# Patient Record
Sex: Female | Born: 1935 | Race: White | Hispanic: No | Marital: Married | Smoking: Former smoker
Health system: Southern US, Community
[De-identification: ages and names within clinical notes are randomized; demographics above are authoritative.]

## PROBLEM LIST (undated history)

## (undated) DIAGNOSIS — F32A Depression, unspecified: Secondary | ICD-10-CM

## (undated) DIAGNOSIS — R05 Cough: Secondary | ICD-10-CM

## (undated) DIAGNOSIS — K5900 Constipation, unspecified: Secondary | ICD-10-CM

## (undated) DIAGNOSIS — M1990 Unspecified osteoarthritis, unspecified site: Secondary | ICD-10-CM

## (undated) DIAGNOSIS — I10 Essential (primary) hypertension: Secondary | ICD-10-CM

## (undated) DIAGNOSIS — R109 Unspecified abdominal pain: Secondary | ICD-10-CM

## (undated) DIAGNOSIS — T17310A Gastric contents in larynx causing asphyxiation, initial encounter: Secondary | ICD-10-CM

## (undated) DIAGNOSIS — I4891 Unspecified atrial fibrillation: Secondary | ICD-10-CM

## (undated) DIAGNOSIS — J189 Pneumonia, unspecified organism: Secondary | ICD-10-CM

## (undated) DIAGNOSIS — I499 Cardiac arrhythmia, unspecified: Secondary | ICD-10-CM

## (undated) DIAGNOSIS — K219 Gastro-esophageal reflux disease without esophagitis: Secondary | ICD-10-CM

## (undated) DIAGNOSIS — F329 Major depressive disorder, single episode, unspecified: Secondary | ICD-10-CM

## (undated) DIAGNOSIS — K5909 Other constipation: Secondary | ICD-10-CM

## (undated) DIAGNOSIS — R059 Cough, unspecified: Secondary | ICD-10-CM

## (undated) DIAGNOSIS — Z9109 Other allergy status, other than to drugs and biological substances: Secondary | ICD-10-CM

## (undated) DIAGNOSIS — E7800 Pure hypercholesterolemia, unspecified: Secondary | ICD-10-CM

## (undated) DIAGNOSIS — M797 Fibromyalgia: Secondary | ICD-10-CM

## (undated) DIAGNOSIS — G4730 Sleep apnea, unspecified: Secondary | ICD-10-CM

## (undated) DIAGNOSIS — K449 Diaphragmatic hernia without obstruction or gangrene: Secondary | ICD-10-CM

## (undated) DIAGNOSIS — C801 Malignant (primary) neoplasm, unspecified: Secondary | ICD-10-CM

## (undated) DIAGNOSIS — R110 Nausea: Secondary | ICD-10-CM

## (undated) DIAGNOSIS — F419 Anxiety disorder, unspecified: Secondary | ICD-10-CM

## (undated) DIAGNOSIS — J45909 Unspecified asthma, uncomplicated: Secondary | ICD-10-CM

## (undated) DIAGNOSIS — R3915 Urgency of urination: Secondary | ICD-10-CM

## (undated) HISTORY — PX: TONSILLECTOMY: SUR1361

## (undated) HISTORY — DX: Cough: R05

## (undated) HISTORY — PX: EYE SURGERY: SHX253

## (undated) HISTORY — DX: Nausea: R11.0

## (undated) HISTORY — DX: Cough, unspecified: R05.9

## (undated) HISTORY — PX: SKIN CANCER EXCISION: SHX779

## (undated) HISTORY — DX: Gastro-esophageal reflux disease without esophagitis: K21.9

## (undated) HISTORY — DX: Other constipation: K59.09

## (undated) HISTORY — PX: CHOLECYSTECTOMY: SHX55

## (undated) HISTORY — PX: APPENDECTOMY: SHX54

## (undated) HISTORY — PX: BACK SURGERY: SHX140

## (undated) HISTORY — PX: SPINE SURGERY: SHX786

---

## 2001-06-15 ENCOUNTER — Ambulatory Visit (HOSPITAL_COMMUNITY): Admission: RE | Admit: 2001-06-15 | Discharge: 2001-06-15 | Payer: Self-pay | Admitting: Internal Medicine

## 2001-06-15 ENCOUNTER — Encounter (INDEPENDENT_AMBULATORY_CARE_PROVIDER_SITE_OTHER): Payer: Self-pay | Admitting: Internal Medicine

## 2003-03-29 ENCOUNTER — Ambulatory Visit (HOSPITAL_COMMUNITY): Admission: RE | Admit: 2003-03-29 | Discharge: 2003-03-29 | Payer: Self-pay | Admitting: Internal Medicine

## 2003-03-29 HISTORY — PX: COLONOSCOPY: SHX174

## 2003-03-29 HISTORY — PX: UPPER GASTROINTESTINAL ENDOSCOPY: SHX188

## 2003-04-19 ENCOUNTER — Ambulatory Visit (HOSPITAL_COMMUNITY): Admission: RE | Admit: 2003-04-19 | Discharge: 2003-04-19 | Payer: Self-pay | Admitting: Internal Medicine

## 2003-04-26 ENCOUNTER — Ambulatory Visit (HOSPITAL_COMMUNITY): Admission: RE | Admit: 2003-04-26 | Discharge: 2003-04-26 | Payer: Self-pay | Admitting: General Surgery

## 2003-04-26 HISTORY — PX: UMBILICAL HERNIA REPAIR: SHX196

## 2003-05-25 ENCOUNTER — Ambulatory Visit (HOSPITAL_COMMUNITY): Admission: RE | Admit: 2003-05-25 | Discharge: 2003-05-25 | Payer: Self-pay | Admitting: Internal Medicine

## 2004-12-11 ENCOUNTER — Ambulatory Visit: Payer: Self-pay | Admitting: Internal Medicine

## 2004-12-28 ENCOUNTER — Encounter: Admission: RE | Admit: 2004-12-28 | Discharge: 2004-12-28 | Payer: Self-pay | Admitting: Internal Medicine

## 2005-03-19 ENCOUNTER — Encounter: Admission: RE | Admit: 2005-03-19 | Discharge: 2005-03-19 | Payer: Self-pay | Admitting: Rheumatology

## 2005-04-16 ENCOUNTER — Encounter: Admission: RE | Admit: 2005-04-16 | Discharge: 2005-04-16 | Payer: Self-pay | Admitting: Neurology

## 2005-04-22 ENCOUNTER — Encounter: Admission: RE | Admit: 2005-04-22 | Discharge: 2005-04-22 | Payer: Self-pay | Admitting: Neurology

## 2005-04-22 ENCOUNTER — Encounter: Payer: Self-pay | Admitting: Orthopedic Surgery

## 2005-10-29 ENCOUNTER — Encounter: Admission: RE | Admit: 2005-10-29 | Discharge: 2005-10-29 | Payer: Self-pay | Admitting: Internal Medicine

## 2006-04-08 ENCOUNTER — Ambulatory Visit: Payer: Self-pay | Admitting: Orthopedic Surgery

## 2006-04-13 ENCOUNTER — Encounter: Admission: RE | Admit: 2006-04-13 | Discharge: 2006-04-13 | Payer: Self-pay | Admitting: Orthopedic Surgery

## 2006-04-29 ENCOUNTER — Ambulatory Visit: Payer: Self-pay | Admitting: Orthopedic Surgery

## 2006-06-04 ENCOUNTER — Ambulatory Visit: Payer: Self-pay | Admitting: Orthopedic Surgery

## 2006-08-07 ENCOUNTER — Ambulatory Visit: Payer: Self-pay | Admitting: Cardiology

## 2006-08-17 ENCOUNTER — Ambulatory Visit: Payer: Self-pay

## 2006-08-17 ENCOUNTER — Encounter: Payer: Self-pay | Admitting: Cardiology

## 2006-09-10 ENCOUNTER — Ambulatory Visit: Payer: Self-pay | Admitting: Orthopedic Surgery

## 2007-03-17 ENCOUNTER — Ambulatory Visit: Payer: Self-pay | Admitting: Orthopedic Surgery

## 2007-03-17 DIAGNOSIS — M25569 Pain in unspecified knee: Secondary | ICD-10-CM

## 2007-03-19 ENCOUNTER — Encounter: Payer: Self-pay | Admitting: Orthopedic Surgery

## 2007-05-25 ENCOUNTER — Telehealth: Payer: Self-pay | Admitting: Orthopedic Surgery

## 2007-06-17 ENCOUNTER — Ambulatory Visit: Payer: Self-pay | Admitting: Orthopedic Surgery

## 2007-06-17 ENCOUNTER — Telehealth: Payer: Self-pay | Admitting: Orthopedic Surgery

## 2007-06-22 DIAGNOSIS — M5430 Sciatica, unspecified side: Secondary | ICD-10-CM | POA: Insufficient documentation

## 2007-06-30 ENCOUNTER — Encounter: Payer: Self-pay | Admitting: Orthopedic Surgery

## 2007-08-11 ENCOUNTER — Ambulatory Visit: Payer: Self-pay | Admitting: Orthopedic Surgery

## 2007-08-11 DIAGNOSIS — IMO0002 Reserved for concepts with insufficient information to code with codable children: Secondary | ICD-10-CM | POA: Insufficient documentation

## 2007-11-16 ENCOUNTER — Encounter: Payer: Self-pay | Admitting: Orthopedic Surgery

## 2007-11-17 ENCOUNTER — Ambulatory Visit: Payer: Self-pay | Admitting: Orthopedic Surgery

## 2007-11-17 DIAGNOSIS — M25559 Pain in unspecified hip: Secondary | ICD-10-CM | POA: Insufficient documentation

## 2007-11-17 DIAGNOSIS — M5137 Other intervertebral disc degeneration, lumbosacral region: Secondary | ICD-10-CM | POA: Insufficient documentation

## 2007-11-24 ENCOUNTER — Telehealth: Payer: Self-pay | Admitting: Orthopedic Surgery

## 2007-11-25 ENCOUNTER — Telehealth: Payer: Self-pay | Admitting: Orthopedic Surgery

## 2007-12-02 ENCOUNTER — Encounter: Payer: Self-pay | Admitting: Orthopedic Surgery

## 2007-12-08 ENCOUNTER — Ambulatory Visit: Payer: Self-pay | Admitting: Orthopedic Surgery

## 2008-04-04 ENCOUNTER — Telehealth: Payer: Self-pay | Admitting: Orthopedic Surgery

## 2008-06-27 ENCOUNTER — Telehealth: Payer: Self-pay | Admitting: Orthopedic Surgery

## 2008-06-29 ENCOUNTER — Encounter (INDEPENDENT_AMBULATORY_CARE_PROVIDER_SITE_OTHER): Payer: Self-pay | Admitting: *Deleted

## 2008-10-31 ENCOUNTER — Telehealth: Payer: Self-pay | Admitting: Orthopedic Surgery

## 2009-03-19 ENCOUNTER — Ambulatory Visit: Payer: Self-pay | Admitting: Orthopedic Surgery

## 2009-03-19 ENCOUNTER — Telehealth: Payer: Self-pay | Admitting: Orthopedic Surgery

## 2009-03-20 ENCOUNTER — Telehealth: Payer: Self-pay | Admitting: Orthopedic Surgery

## 2009-03-22 ENCOUNTER — Telehealth: Payer: Self-pay | Admitting: Orthopedic Surgery

## 2009-03-27 ENCOUNTER — Telehealth: Payer: Self-pay | Admitting: Orthopedic Surgery

## 2009-04-11 ENCOUNTER — Encounter: Payer: Self-pay | Admitting: Orthopedic Surgery

## 2009-04-18 ENCOUNTER — Ambulatory Visit: Payer: Self-pay | Admitting: Orthopedic Surgery

## 2009-04-18 DIAGNOSIS — M4800 Spinal stenosis, site unspecified: Secondary | ICD-10-CM | POA: Insufficient documentation

## 2009-04-19 ENCOUNTER — Telehealth: Payer: Self-pay | Admitting: Orthopedic Surgery

## 2009-04-26 ENCOUNTER — Telehealth: Payer: Self-pay | Admitting: Orthopedic Surgery

## 2009-05-07 ENCOUNTER — Ambulatory Visit (HOSPITAL_COMMUNITY): Admission: RE | Admit: 2009-05-07 | Discharge: 2009-05-07 | Payer: Self-pay | Admitting: General Surgery

## 2009-08-03 HISTORY — PX: UPPER GASTROINTESTINAL ENDOSCOPY: SHX188

## 2009-11-28 ENCOUNTER — Ambulatory Visit (HOSPITAL_COMMUNITY): Admission: RE | Admit: 2009-11-28 | Discharge: 2009-11-28 | Payer: Self-pay | Admitting: Pulmonary Disease

## 2009-12-13 ENCOUNTER — Ambulatory Visit (HOSPITAL_COMMUNITY): Admission: RE | Admit: 2009-12-13 | Discharge: 2009-12-13 | Payer: Self-pay | Admitting: Pulmonary Disease

## 2010-02-04 ENCOUNTER — Ambulatory Visit
Admission: RE | Admit: 2010-02-04 | Discharge: 2010-02-04 | Payer: Self-pay | Source: Home / Self Care | Attending: Internal Medicine | Admitting: Internal Medicine

## 2010-02-16 ENCOUNTER — Encounter: Payer: Self-pay | Admitting: Internal Medicine

## 2010-02-16 ENCOUNTER — Encounter (INDEPENDENT_AMBULATORY_CARE_PROVIDER_SITE_OTHER): Payer: Self-pay | Admitting: Internal Medicine

## 2010-02-17 ENCOUNTER — Encounter (INDEPENDENT_AMBULATORY_CARE_PROVIDER_SITE_OTHER): Payer: Self-pay | Admitting: Internal Medicine

## 2010-02-26 NOTE — Assessment & Plan Note (Signed)
Summary: RT KNEE PAIN MAY NEED XR/MEDICARE/AARP/BSF   Visit Type:  Follow-up  CC:  right knee pain.  History of Present Illness: Zoe [name] is 75 years old she has pain in her RIGHT knee with chronic bursitis and arthritis  She was injected in July 2009 and also one time prior to getting better results the first time in the last  She presents back with medial knee pain and giving out of the RIGHT leg.  She is currently him Motrin mild relief, soma good relief but it lacks her out.  Previous MRI done in 2009 shows ruptured disc and degenerative disc disease at multiple levels.  She has a history of previous physical therapy and massage for her sciatica which seems to be recurrent  the medial knee pain his OxyContin from her arthritis but the leg is giving out from simple walking and I think this is coming from her back problem and I would like to repeat the MRI  We also gave her an injection in the RIGHT knee and the bursal tissue.            Allergies (verified): 1)  ! * Codeine 2)  ! * Depomedrol  Past History:  Past Medical History: Last updated: 03/05/2007 HEART - A. FIB CONSTIPATION SLEEP DISORDER ACID REFLUX  Past Surgical History: Last updated: 03/05/2007 2 BACK SURGERIES GALL BLADDER APPENDIX CANCER ON NECK T & A  Review of Systems       medical review of systems she is currently not having any chest pain unexpected weight loss.  Her leg does give out as stated.  Is not having any numbness or tingling her gait is unsteady.    Physical Exam  Additional Exam:  GENERAL: Appearance is normal   CDV: normal pulse and temperature in both lower extremities  Skin: was normal in both lower extremities  Neuro: sensation was normal in both lower extremities  She still ambulates without a LEFT   swelling and tenderness on the medial aspects of both knees over the pes bursa.  She has mild flexion contractures but flexion arc is 120+ degrees, motor exam  normal, joint stable.  Both medial joint lines are also tender.       Impression & Recommendations:  Problem # 1:  ANSERINE BURSITIS, RIGHT (ICD-726.61) Assessment Deteriorated  3 views ordered RIGHT knee/knee pain history of osteoarthritis  Report the views are obtained standing in standard fashion.  There is a varus alignment to the RIGHT knee.  There is mild medial joint space narrowing of the RIGHT knee.  There are no marginal osteophytes.  The tibial spines have peaked.  Impression mild osteoarthritis RIGHT knee  Assessment: Zoe [name] continues to have pain in her RIGHT knee she does have some bursitis and most like he has osteoarthritis which is worse than his seen on x-ray.  She also has degenerative disc disease and her giving out of the RIGHT leg while walking straight ahead is not consistent with osteoarthritis of the RIGHT knee especially given the x-ray findings of minimal change.  Recommend workup with MRI to evaluate lumbar disc disease as the cause of giving out of the RIGHT leg  Inject RIGHT knee bursal sac Verbal consent was obtained. The knee was prepped with alcohol and ethyl chloride. 1 cc of depomedrol 40mg /cc and 4 cc of lidocaine 1% was injected. there were no complications.  Orders: Est. Patient Level IV (08657) Knee x-ray,  3 views (84696) Joint Aspirate / Injection, Large (20610) Depo- Medrol  40mg  (J1030)  Problem # 2:  DEGEN LUMBAR/LUMBOSACRAL INTERVERTEBRAL DISC (ICD-722.52) Assessment: Deteriorated  MRI lumbar spine, to evaluate giving out symptoms of the RIGHT leg in the setting of history of degenerative disc disease with moderate foraminal stenosis, symptoms are worsening.  Orders: Est. Patient Level IV (16109)  Medications Added to Medication List This Visit: 1)  Allegra   Patient Instructions: 1)  MRI L spine return here after mri

## 2010-02-26 NOTE — Miscellaneous (Signed)
Summary: lyrica  Clinical Lists Changes  Medications: Added new medication of * LYRICA 50MG  one tablet three times a day for 90 days  Appended Document: lyrica number [id number] fax for 90 day supply.

## 2010-02-26 NOTE — Assessment & Plan Note (Signed)
Summary: MRI RESULTS BRINGING FILM/MED/MUT OF OMAHA/BSF   Visit Type:  Follow-up  CC:  MRI results L spine.  History of Present Illness: a 75 year old female with chronic back pain status post 2 lumbar disc surgeries sent for MRI because of continuing back and leg pain weakness in her legs and giving way of the legs.  Secondary diagnosis of osteoarthritis bursitis knee.  review of systems bowel and bladder function remained intact  Image: MRI L spine [facility] on 04/11/09.  Reading:  moderate multilevel foraminal stenosis.  Medications:  Lyrica 50 three times a day not much relief.  complains of LEFT buttock pain    Allergies: 1)  ! * Codeine 2)  ! * Depomedrol   Impression & Recommendations:  Problem # 1:  DEGEN LUMBAR/LUMBOSACRAL INTERVERTEBRAL DISC (ICD-722.52) Assessment Deteriorated  I reviewed her MRI with the report shows multilevel disc disease with foraminal stenosis which is moderate at several levels I think L5-S1 seems to be the most significant disease and advised epidural injections.  She is having chronic pain at this point I do not think pain management by me is the answer so I've advised her to get pain management clinic.  Orders: Est. Patient Level III (16109)  Problem # 2:  SPINAL STENOSIS (ICD-724.00) Assessment: Deteriorated  Orders: Est. Patient Level III (60454)  Medications Added to Medication List This Visit: 1)  Norco 5-325 Mg Tabs (Hydrocodone-acetaminophen) .Marland Kitchen.. 1 by mouth q 6 as needed pain  Patient Instructions: 1)  Consult for Dr Eduard Clos pain control 2)  Will call us if interested in ESI at L5-S1 Prescriptions: NORCO 5-325 MG TABS (HYDROCODONE-ACETAMINOPHEN) 1 by mouth q 6 as needed pain  #40 x 2   Entered and Authorized by:   Fuller Canada MD   Signed by:   Fuller Canada MD on 04/18/2009   Method used:   Print then Give to Patient   RxID:   [id number]

## 2010-02-26 NOTE — Progress Notes (Signed)
Summary: Office Visit  Office Visit   Imported By: Jacklynn Ganong 03/08/2007 08:00:23  _____________________________________________________________________  External Attachment:    Type:   Image     Comment:   progress note

## 2010-02-26 NOTE — Progress Notes (Signed)
Summary: why you will not prescribe pain med?  Phone Note Call from Patient   Summary of Call: Relayed the message to Deette Revak (10-24-2035) that you noted for her to call her primary care doctor for pain medicine. She wants to know why you will not prescribe anything for pain when she came to you for her pain.  Her # [phone] Initial call taken by: Jacklynn Ganong,  March 22, 2009 2:30 PM

## 2010-02-26 NOTE — Progress Notes (Signed)
Summary: Referral to Dr. Eduard Clos  Phone Note Outgoing Call   Call placed by: Waldon Reining,  April 19, 2009 1:51 PM Call placed to: Specialist Action Taken: Information Sent Summary of Call: I faxed a referral for this patient to Dr. Eduard Clos  for pain control.

## 2010-02-26 NOTE — Progress Notes (Signed)
Summary: Appointment with Dr. Eduard Clos.  Phone Note From Other Clinic   Caller: Referral Coordinator Reason for Call: Schedule Patient Appt Summary of Call: Patient has an appointment with Dr. Eduard Clos on 05-10-09 at 12:30. Patient is aware of her appointment. Call placed by: Waldon Reining,  April 26, 2009 3:51 PM

## 2010-02-26 NOTE — Progress Notes (Signed)
Summary: What about Lyrica?  Phone Note Call from Patient   Summary of Call: Zoe [name] (08/22/2035) has a new insurance and will need a new prescription written for a 90 day supply of Lyrica 50mg . Wants [place] to mail the prescription to her . 2. And she says she cannot take the Tylenol #3 Chasity called in yesterday because she cannot take codeine.  I will  mail Prescription to her at :  [address] [place], [place].  [id number] Initial call taken by: Jacklynn Ganong,  March 20, 2009 11:35 AM  Follow-up for Phone Call        call primary care doctor for pain med Follow-up by: Fuller Canada MD,  March 20, 2009 12:10 PM  Additional Follow-up for Phone Call Additional follow up Details #1::        does she also need to call primary care doctor for the Lyrica? Additional Follow-up by: Jacklynn Ganong,  March 20, 2009 3:20 PM

## 2010-02-26 NOTE — Progress Notes (Signed)
Summary: wants pain prescription  Phone Note Call from Patient   Summary of Call: Wants to know if you will prescribe another pain medicine since the Darvocet  is off the market, needs it to take before bedtimne. Wants something that will not constipate her.  Uses [facility] [phone] Initial call taken by: Jacklynn Ganong,  March 19, 2009 4:41 PM  Follow-up for Phone Call        all pain meds will casue constipation  tyl # 3 1 q 6 as needed pain # 56 1 refill  Follow-up by: Fuller Canada MD,  March 19, 2009 4:51 PM  Additional Follow-up for Phone Call Additional follow up Details #1::        Routed to [facility] to call to [facility] Additional Follow-up by: Jacklynn Ganong,  March 20, 2009 8:06 AM    Additional Follow-up for Phone Call Additional follow up Details #2::    called med in, tried to call patient no answer or machine Follow-up by: Ether Griffins,  March 20, 2009 8:39 AM

## 2010-02-26 NOTE — Progress Notes (Signed)
Summary: MRI appointment.  Phone Note Outgoing Call   Call placed by: Waldon Reining,  March 27, 2009 9:50 AM Call placed to: Patient Action Taken: Appt scheduled Summary of Call: I called to give patient her MRI appointment at [facility] on 03-30-09 at 5:45pm. Patient has Medicare, no precert needed. Patient will follow up here for her results.

## 2010-04-17 LAB — CBC
HCT: 35.9 % — ABNORMAL LOW (ref 36.0–46.0)
Hemoglobin: 12.7 g/dL (ref 12.0–15.0)
MCHC: 35.5 g/dL (ref 30.0–36.0)
MCV: 94.3 fL (ref 78.0–100.0)
Platelets: 182 10*3/uL (ref 150–400)
RBC: 3.81 MIL/uL — ABNORMAL LOW (ref 3.87–5.11)
RDW: 13.6 % (ref 11.5–15.5)
WBC: 7.7 10*3/uL (ref 4.0–10.5)

## 2010-04-17 LAB — BASIC METABOLIC PANEL
BUN: 13 mg/dL (ref 6–23)
CO2: 28 mEq/L (ref 19–32)
Calcium: 9.2 mg/dL (ref 8.4–10.5)
Chloride: 103 mEq/L (ref 96–112)
Creatinine, Ser: 0.56 mg/dL (ref 0.4–1.2)
GFR calc Af Amer: >60 mL/min (ref >60)
GFR calc non Af Amer: >60 mL/min (ref >60)
Glucose, Bld: 107 mg/dL — ABNORMAL HIGH (ref 70–99)
Potassium: 4.1 mEq/L (ref 3.5–5.1)
Sodium: 139 mEq/L (ref 135–145)

## 2010-06-06 ENCOUNTER — Ambulatory Visit (HOSPITAL_COMMUNITY)
Admission: RE | Admit: 2010-06-06 | Discharge: 2010-06-06 | Disposition: A | Discharge status: Home / Self Care | Payer: Medicare Other | Source: Ambulatory Visit | Attending: Pulmonary Disease | Admitting: Pulmonary Disease

## 2010-06-06 ENCOUNTER — Other Ambulatory Visit (HOSPITAL_COMMUNITY): Payer: Self-pay | Admitting: Pulmonary Disease

## 2010-06-06 DIAGNOSIS — M545 Low back pain, unspecified: Secondary | ICD-10-CM | POA: Insufficient documentation

## 2010-06-06 DIAGNOSIS — W19XXXA Unspecified fall, initial encounter: Secondary | ICD-10-CM

## 2010-06-06 DIAGNOSIS — IMO0002 Reserved for concepts with insufficient information to code with codable children: Secondary | ICD-10-CM | POA: Insufficient documentation

## 2010-06-06 DIAGNOSIS — M549 Dorsalgia, unspecified: Secondary | ICD-10-CM

## 2010-06-06 DIAGNOSIS — X58XXXA Exposure to other specified factors, initial encounter: Secondary | ICD-10-CM | POA: Insufficient documentation

## 2010-06-11 NOTE — Assessment & Plan Note (Signed)
[facility]                            CARDIOLOGY OFFICE NOTE   Letasha, Kershaw BRIGITTE SODERBERG                     MRN:          [id number]  DATE:08/07/2006                            DOB:          [birth year]    REASON FOR VISIT:  I was asked to see Zoe [name] by Dr. Doreen Beam  for atrial fibrillation.   HISTORY OF PRESENT ILLNESS:  She is a 75 year old white female who had  her first episode of atrial fibrillation 22 years ago while vacationing  in [place]. It lasted for several hours.   She then had a prolonged event in [place], [place] years ago as well  and had to be hospitalized.   In 1988, she had her last episode of this. At that time, she was in  [place], [place]. She underwent a stress thallium, which showed some  diaphragmatic attenuation, otherwise negative.   She will have occasional palpitations but they are very short lived.   She went to get her Digoxin filled recently by a pharmacist and they  suggested that she see a cardiologist.   ALLERGIES:  CODEINE. She has had no dye reactions.   SOCIAL HISTORY:  She does smoke about 2 to 10 cigarettes a week. She has  for 40 years. She does have a couple of alcohol beverages a week. She  drinks 2 cups of coffee a day, which is caffeinated. She is married. Her  husband is with her today.   PAST SURGICAL HISTORY:  She has had a cholecystectomy, back surgery  twice, tonsillectomy, adenoidectomy, and appendectomy.   FAMILY HISTORY:  Negative for premature coronary disease.   REVIEW OF SYSTEMS:  She has a history of seasonal allergies. She has had  some constipation all of her life. She has occasional fatigue. She has a  hiatal hernia and some reflux and takes Prilosec. She has occasional  anxiety episodes. She had a cancer removed off her neck 12 years ago.   CURRENT MEDICATIONS:  Doxepin 100 mg, Digoxin 0.25 mg p.o. daily,  Darvocet 100 mg daily, Lyrica 50 mg daily, Prilosec 30 mg  daily, Quinine  sulfate 150 mg daily, Etodolac 400 mg daily, Biotin 300 mg daily,  Senokot daily, vitamin C 1000 mg daily, red yeast rice 400 mg daily,  Omega fish oil 1000 mg daily, aspirin 81 mg daily.   PHYSICAL EXAMINATION:  VITAL SIGNS:  Blood pressure 139/82 in the right  arm and 146/82 in the left arm. Pulse 73 and regular.  HEENT:  Normocephalic and atraumatic. PERRLA. Extraocular muscles  intact. Sclerae clear. She wears glasses.  NECK:  Supple. Carotid upstrokes are equal bilaterally without bruits,  no JVD. Thyroid is not enlarged. Trachea is midline.  LUNGS:  Clear.  HEART:  Non-displaced PMI. She has a normal S1 and S2. No gallop, rub,  or murmur.  ABDOMEN:  Exam is soft. Good bowel sounds.  EXTREMITIES:  No edema. Pulses are intact.  NEUROLOGIC:  Examination is intact.  SKIN:  Intact.   LABORATORY DATA:  EKG is completely normal and is unchanged since 1988.   ASSESSMENT:  1. Paroxysmal atrial fibrillation. This has been fairly infrequent      over the last 22 years, though Digoxin is not a good drug for      keeping people in sinus rhythm. She says, however, that when she      has more frequent episodes of palpitations, her levels are usually      low.  2. Hyperlipidemia. She is on red yeast rice and I suggested that she      strongly consider a Statin with her risk factors.   PLAN:  1. A 2-D echocardiogram to rule out any significant structural heart      problems and also evaluate whether or not she has LVH. If she does      have LVH, I would recommend stopping her Digoxin and switching to      perhaps, Diltiazem or a beta blocker.  2. She was advised that if she has prolonged atrial fibrillation, that      she needs to be anticoagulated to prevent stroke. She is also      advised that if she started having more frequent episodes, we would      like to see her back for further treatment consideration.     Thomas C. Daleen Squibb, MD, [facility]  Electronically Signed     TCW/MedQ  DD: 08/07/2006  DT: 08/08/2006  Job #: [id number]   cc:   Doreen Beam

## 2010-06-14 NOTE — H&P (Signed)
NAME:  Zoe [name], Zoe [name] NO.:  [id number]   MEDICAL RECORD NO.:  [id number]                  PATIENT TYPE:   LOCATION:                                       FACILITY:   PHYSICIAN:  Dalia Heading, M.D.               DATE OF BIRTH:   DATE OF ADMISSION:  DATE OF DISCHARGE:                                HISTORY & PHYSICAL   NO DICTATION     ___________________________________________                                         Dalia Heading, M.D.   MAJ/MEDQ  D:  04/01/2003  T:  04/01/2003  Job:  [id number]

## 2010-06-14 NOTE — H&P (Signed)
[name], [name]                          ACCOUNT NO.:  [id number]   MEDICAL RECORD NO.:  [id number]                  PATIENT TYPE:   LOCATION:                                       FACILITY:   PHYSICIAN:  Zoe [name], M.D.               DATE OF BIRTH:  [birth year]   DATE OF ADMISSION:  04/05/2003  DATE OF DISCHARGE:                                HISTORY & PHYSICAL   CHIEF COMPLAINT:  Umbilical hernia.   HISTORY OF PRESENT ILLNESS:  The patient is a 75 year old white female who  was referred for evaluation and treatment of an umbilical hernia.  This has  been present for several years, but recently has become more uncomfortable.  It does swell on occasion, but reduces with relaxation.  No nausea or  vomiting had been noted.   PAST MEDICAL HISTORY:  Includes atherosclerotic heart disease.   PAST SURGICAL HISTORY:  Cholecystectomy, tonsillectomy, appendectomy, 2 back  surgeries.   CURRENT MEDICATIONS:  Lanoxin, Darvocet, Nexium.   ALLERGIES:  CODEINE and PENICILLIN.   REVIEW OF SYSTEMS:  The patient does smoke 1/2 pack of cigarettes a day.  She drinks alcohol socially.  She denies any recent cardiopulmonary  difficulties or bleeding disorders.   PHYSICAL EXAMINATION:  GENERAL:  On physical examination, the patient is a  well-developed well-nourished white female in no acute distress.  VITAL SIGNS:  She is afebrile and vital signs are stable.  LUNGS:  Clear to auscultation with equal breath sounds bilaterally.  HEART:  Heart examination reveals a regular rate and rhythm without S3, S4,  or murmurs.  ABDOMEN:  The abdomen is soft, nontender, nondistended.  No  hepatosplenomegaly or masses are noted.  A small reducible umbilical hernia  is present.   IMPRESSION:  Umbilical hernia.   PLAN:  The patient is scheduled for an umbilical herniorrhaphy on April 05, 2003.  The risks and benefits of the procedure including bleeding,  infection, and recurrence of the hernia  were fully explained to the patient,  who gave informed consent.     ___________________________________________                                         Zoe [name], M.D.   MAJ/MEDQ  D:  03/23/2003  T:  03/23/2003  Job:  [id number]   cc:   Lionel December, M.D.  [address]  [place]  [place] [address]  Fax: [phone]   Zoe [name], M.D.  [address], [place]  [place] [address]  Fax: [phone]  [address]  [place]  [place] [address]  Fax: [phone]

## 2010-06-14 NOTE — Op Note (Signed)
NAME:  LATANZA, PFEFFERKORN                        ACCOUNT NO.:  [id number]   MEDICAL RECORD NO.:  [id number]                   PATIENT TYPE:  AMB   LOCATION:  DAY                                  FACILITY:  [facility]   PHYSICIAN:  Dalia Heading, M.D.               DATE OF BIRTH:  10/11/1935   DATE OF PROCEDURE:  04/26/2003  DATE OF DISCHARGE:                                 OPERATIVE REPORT   PREOPERATIVE DIAGNOSIS:  Umbilical hernia.   POSTOPERATIVE DIAGNOSIS:  Umbilical hernia.   PROCEDURE:  Umbilical herniorrhaphy.   SURGEON:  Dalia Heading, M.D.   ANESTHESIA:  General endotracheal.   INDICATIONS FOR PROCEDURE:  The patient is a 75 year old white female who  presents with a symptomatic umbilical hernia.  The risks and benefits of the  procedure, including bleeding, infection, and recurrence of the hernia were  fully explained to the patient who gave informed consent.   DESCRIPTION OF PROCEDURE:  The patient was placed in the supine position.  After induction of general endotracheal anesthesia, the abdomen was prepped  and draped using the usual sterile technique with Betadine.  Surgical site  confirmation was performed.   An infraumbilical incision was made down to the fascia.  The umbilicus was  freed away from the underlying fascia.  A small hernia defect was found.  This was repaired transversely using 0 Surgidac interrupted sutures x2.  The  base of the umbilicus was secured back to the fascia using a 2-0 Vicryl  interrupted suture.  The subcutaneous layer was reapproximated using a 3-0  Vicryl interrupted suture.  The skin was closed using a 4-0 Vicryl  subcuticular suture.  Sensorcaine 0.5% was instilled into the surrounding  wound, and the wound was covered with collodion.   All tape and needle counts were correct at the end of the procedure.  The  patient was extubated in the operating room and went back to the recovery  room awake and in stable condition.   COMPLICATIONS:  None.   SPECIMENS:  None.   ESTIMATED BLOOD LOSS:  None.      ___________________________________________                                            Dalia Heading, M.D.   MAJ/MEDQ  D:  04/26/2003  T:  04/26/2003  Job:  [id number]   cc:   Lionel December, M.D.  [address]  [place]  [place] [address]  Fax: [phone]   Doreen Beam  [address]  [place]  [place] [address]  Fax: [phone]

## 2010-06-14 NOTE — Op Note (Signed)
NAME:  Zoe [name], Zoe [name]                        ACCOUNT NO.:  [id number]   MEDICAL RECORD NO.:  [id number]                   PATIENT TYPE:  AMB   LOCATION:  DAY                                  FACILITY:  [facility]   PHYSICIAN:  Lionel December, M.D.                 DATE OF BIRTH:  August 16, 1935   DATE OF PROCEDURE:  03/29/2003  DATE OF DISCHARGE:                                 OPERATIVE REPORT   PROCEDURE:  Esophagogastroduodenoscopy with esophageal dilatation followed  by total colonoscopy   ENDOSCOPIST:  Lionel December, M.D.   INDICATIONS:  This patient is a 75 year old Caucasian female who has  symptoms of chronic GERD presently maintained on a PPI who is also having  intermittent solid food dysphagia.  She has never undergone EGD.  We also  need to make sure that she does not have Barrett's esophagus.  She will also  undergo screening colonoscopy.  She has chronic constipation.   Procedure and risks were reviewed with the patient and informed consent was  obtained.   PREOPERATIVE MEDICATIONS:  Cetacaine spray for oropharyngeal topical  anesthesia, Demerol 50 mg IV and Versed 13 mg IV in divided dose.   FINDINGS:  Procedure performed in endoscopy suite.  The patient's vital  signs and O2 saturation were monitored during the procedure and remained  stable.   PROCEDURE #1: ESOPHAGOGASTRODUODENOSCOPY:  The patient was placed in the  left lateral recumbent position and Olympus videoscope was passed via the  oropharynx without any difficulty into the esophagus.   ESOPHAGUS:  Mucosa of the esophagus was normal throughout.  The  squamocolumnar junction was wavy, but there was no ring or stricture.  There  was a 3-cm size sliding hiatal hernia.   STOMACH:  It was empty and distended very well with insufflation.  The folds  of the proximal stomach were normal.  Examination of the mucosa at body,  antrum, pyloric channel, as well as angularis, fundus, and cardia were  normal.   DUODENUM:   Examination of the bulb and second part of the duodenum was also  normal.  Endoscope was withdrawn.   The esophagus was dilated by passing 56 [place] Maloney dilator to full  insertion.  The esophagus was reexamined post ED and there was no mucosal  injury to the esophagus.   Endoscope was withdrawn.  The patient was prepared for procedure #2.   COLONOSCOPY:  Rectal examination was performed.  No abnormality noted on  external or digital exam.   Olympus videoscope was placed in the rectum and advanced under vision into  the sigmoid colon and beyond.  Preparation was satisfactory.  She had more  or less diffuse pigmentation consistent with melanosis coli; most pronounced  in right colon and rectum.  Hepatic flexure was redundant, but the scope was  passed into the cecum which was identified by ileocecal valve appendiceal  orifice.  Pictures taken for the record.  As the scope was withdrawn the  colonic mucosa was, once again, carefully examined; and there were no polyps  and/or tumor masses.  Also did not see any diverticula.  Rectal mucosa was  normal.  The scope was retroflexed to examine the anorectal junction which  was unremarkable.   The endoscope was straightened and withdrawn.  The patient tolerated the  procedure well.   FINAL DIAGNOSES:  1. Small sliding hiatal hernia.  2. Nonerosive antral gastritis.  3. Esophagus dilated by passing 56 [place] Maloney dilator given history of     dysphagia, but this does not produce any mucosal injury.  4. Melanosis coli, otherwise normal redundant colon.   RECOMMENDATIONS:  1. She will continue her antireflux measures and Nexium as before.  2. She will proceed with umbilical herniorrhaphy as planned.  3. Will check her H. pylori serology when she has preoperative lab studies     for umbilical herniorrhaphy.  4. She can proceed with surgery as planned with Dr. Lovell Sheehan (umbilical     herniorrhaphy).       ___________________________________________                                            Lionel December, M.D.   NR/MEDQ  D:  03/29/2003  T:  03/30/2003  Job:  [id number]   cc:   Dalia Heading, M.D.  [address], [place]  [place] [address]  Fax: [phone]  [address]  [place]  [place] [address]  Fax: [phone]

## 2010-06-16 ENCOUNTER — Emergency Department (HOSPITAL_COMMUNITY): Payer: Medicare Other

## 2010-06-16 ENCOUNTER — Emergency Department (HOSPITAL_COMMUNITY)
Admission: EM | Admit: 2010-06-16 | Discharge: 2010-06-16 | Disposition: A | Discharge status: Home / Self Care | Payer: Medicare Other | Attending: Emergency Medicine | Admitting: Emergency Medicine

## 2010-06-16 DIAGNOSIS — M79609 Pain in unspecified limb: Secondary | ICD-10-CM | POA: Insufficient documentation

## 2010-06-16 DIAGNOSIS — Z86718 Personal history of other venous thrombosis and embolism: Secondary | ICD-10-CM | POA: Insufficient documentation

## 2010-06-16 LAB — BASIC METABOLIC PANEL
BUN: 12 mg/dL (ref 6–23)
CO2: 30 mEq/L (ref 19–32)
Calcium: 9.8 mg/dL (ref 8.4–10.5)
Chloride: 102 mEq/L (ref 96–112)
Creatinine, Ser: 0.52 mg/dL (ref 0.4–1.2)
GFR calc Af Amer: >60 mL/min (ref >60)
GFR calc non Af Amer: >60 mL/min (ref >60)
Glucose, Bld: 110 mg/dL — ABNORMAL HIGH (ref 70–99)
Potassium: 4.3 mEq/L (ref 3.5–5.1)
Sodium: 140 mEq/L (ref 135–145)

## 2010-06-16 LAB — DIFFERENTIAL
Basophils Absolute: 0 10*3/uL (ref 0.0–0.1)
Basophils Relative: 0 % (ref 0–1)
Eosinophils Absolute: 0.3 10*3/uL (ref 0.0–0.7)
Eosinophils Relative: 4 % (ref 0–5)
Lymphocytes Relative: 15 % (ref 12–46)
Lymphs Abs: 1.2 10*3/uL (ref 0.7–4.0)
Monocytes Absolute: 0.5 10*3/uL (ref 0.1–1.0)
Monocytes Relative: 6 % (ref 3–12)
Neutro Abs: 6.1 10*3/uL (ref 1.7–7.7)
Neutrophils Relative %: 76 % (ref 43–77)

## 2010-06-16 LAB — CBC
HCT: 36.5 % (ref 36.0–46.0)
Hemoglobin: 12.1 g/dL (ref 12.0–15.0)
MCH: 31.9 pg (ref 26.0–34.0)
MCHC: 33.2 g/dL (ref 30.0–36.0)
MCV: 96.3 fL (ref 78.0–100.0)
Platelets: 143 10*3/uL — ABNORMAL LOW (ref 150–400)
RBC: 3.79 MIL/uL — ABNORMAL LOW (ref 3.87–5.11)
RDW: 13.5 % (ref 11.5–15.5)
WBC: 8.1 10*3/uL (ref 4.0–10.5)

## 2010-06-16 LAB — PROTIME-INR
INR: 0.94 (ref 0.00–1.49)
Prothrombin Time: 12.8 seconds (ref 11.6–15.2)

## 2010-06-16 LAB — APTT: aPTT: 32 seconds (ref 24–37)

## 2010-06-17 ENCOUNTER — Emergency Department (HOSPITAL_COMMUNITY)
Admission: EM | Admit: 2010-06-17 | Discharge: 2010-06-17 | Discharge status: Against Medical Advice | Payer: Medicare Other | Attending: Emergency Medicine | Admitting: Emergency Medicine

## 2010-06-17 ENCOUNTER — Emergency Department (HOSPITAL_COMMUNITY): Payer: Medicare Other

## 2010-06-17 DIAGNOSIS — M545 Low back pain, unspecified: Secondary | ICD-10-CM

## 2010-06-17 DIAGNOSIS — M79609 Pain in unspecified limb: Secondary | ICD-10-CM | POA: Insufficient documentation

## 2010-06-20 ENCOUNTER — Inpatient Hospital Stay (HOSPITAL_COMMUNITY): Admission: RE | Admit: 2010-06-20 | Payer: Medicare Other | Source: Ambulatory Visit

## 2010-06-20 ENCOUNTER — Inpatient Hospital Stay (HOSPITAL_COMMUNITY)
Admission: RE | Admit: 2010-06-20 | Discharge: 2010-06-25 | DRG: 552 | Disposition: A | Discharge status: Home Health | Payer: Medicare Other | Source: Ambulatory Visit | Attending: Pulmonary Disease | Admitting: Pulmonary Disease

## 2010-06-20 DIAGNOSIS — I10 Essential (primary) hypertension: Secondary | ICD-10-CM | POA: Diagnosis present

## 2010-06-20 DIAGNOSIS — E785 Hyperlipidemia, unspecified: Secondary | ICD-10-CM | POA: Diagnosis present

## 2010-06-20 DIAGNOSIS — Z86718 Personal history of other venous thrombosis and embolism: Secondary | ICD-10-CM

## 2010-06-20 DIAGNOSIS — M5137 Other intervertebral disc degeneration, lumbosacral region: Principal | ICD-10-CM | POA: Diagnosis present

## 2010-06-20 DIAGNOSIS — M51379 Other intervertebral disc degeneration, lumbosacral region without mention of lumbar back pain or lower extremity pain: Principal | ICD-10-CM | POA: Diagnosis present

## 2010-06-21 ENCOUNTER — Inpatient Hospital Stay (HOSPITAL_COMMUNITY): Payer: Medicare Other

## 2010-06-21 MED ORDER — GADOBENATE DIMEGLUMINE 529 MG/ML IV SOLN: 15.0000 mL | Freq: Once | INTRAVENOUS | Status: AC | PRN

## 2010-06-21 NOTE — H&P (Signed)
Zoe [name]              ACCOUNT NO.:  [id number]  MEDICAL RECORD NO.:  [id number]           PATIENT TYPE:  I  LOCATION:  A323                          FACILITY:  [facility]  PHYSICIAN:  Zebulun Deman L. Juanetta Gosling, M.D.DATE OF BIRTH:  12/28/1935  DATE OF ADMISSION:  06/20/2010 DATE OF DISCHARGE:  LH                             HISTORY & PHYSICAL   REASON FOR ADMISSION:  Back pain.  HISTORY:  Zoe [name] is a 75 year old who developed back pain about 2 weeks ago.  We have been working with her, trying to get her on something to control her back pain and also get her an MRI done.  She has been unable to do the MRI.  She has a number of problems with taking medications and has had difficulty in getting comfortable.  She is having more and more problems.  She is not able to ambulate at home except with a great deal of pain and I brought her into the hospital to try to get control of her pain and also to see if we can get some sort of a evaluation of exactly what is going on with her back.  The pain runs down her right leg.  She does not have any numbness.  She does not have any other complaints.  She does not have any loss of continence.  Her past medical history is positive for history of DVT for gout for osteoarthritis.  Surgically, she has had an appendectomy, back surgery in the past and cholecystectomy and a tubal ligation.  SOCIAL HISTORY:  She is married, lives at home with her husband.  She uses alcohol occasionally.  She does not use any illicit drugs and she does not use any tobacco.  Her review of systems except as mentioned is negative.  Family history is not known to be positive for back problems.  Her review of systems shows that she has chronic constipation.  Physical examination shows a well-developed, well-nourished female who is in no acute distress, but complaining of severe pain.  Her pupils are reactive to light and accommodation.  Nose and throat are clear.   Her mucous membranes are moist.  Neck is supple without masses, bruits or JVD.  Her chest is clear without wheezes, rales or rhonchi.  Heart is regular without murmur, gallop or rub.  Her abdomen is soft without masses.  Extremities showed no edema.  Central nervous system exam is grossly intact.  She does not have any reflexes.  Laboratory work is pending.  Assessment then is that she has back pain which seems to be severe.  She is having a difficult time taking medications.  She has not been able to get an MRI done.  My plan then is to give her Dilaudid for pain to see if that helps.  I am going to put her on IV steroids.  I am going to continue with all of her other treatments.  I am going to see if we can get an MRI done and then try to decide what to do from there.     Najir Roop L. Juanetta Gosling, M.D.  ELH/MEDQ  D:  06/20/2010  T:  06/21/2010  Job:  [id number]  Electronically Signed by Kari Baars M.D. on 06/21/2010 03:44:23 PM

## 2010-06-22 DIAGNOSIS — M5137 Other intervertebral disc degeneration, lumbosacral region: Secondary | ICD-10-CM

## 2010-06-26 NOTE — Discharge Summary (Signed)
  Zoe [name], Zoe [name]              ACCOUNT NO.:  [id number]  MEDICAL RECORD NO.:  [id number]           PATIENT TYPE:  I  LOCATION:  A323                          FACILITY:  [facility]  PHYSICIAN:  Bisma Klett L. Juanetta Gosling, M.D.DATE OF BIRTH:  1936/01/09  DATE OF ADMISSION:  06/20/2010 DATE OF DISCHARGE:  05/29/2012LH                              DISCHARGE SUMMARY   ADDENDUM:  I had stated [facility], she is actually going to see [facility].     [name] L. Juanetta Gosling, M.D.     ELH/MEDQ  D:  06/25/2010  T:  06/25/2010  Job:  [id number]  cc:   [facility]  Herminio Heads, MD Fax: [phone]  Electronically Signed by Kari Baars M.D. on 06/26/2010 08:27:07 AM

## 2010-06-26 NOTE — Group Therapy Note (Signed)
  Zoe [name], BERHOW              ACCOUNT NO.:  [id number]  MEDICAL RECORD NO.:  [id number]           PATIENT TYPE:  LOCATION:                                 FACILITY:  PHYSICIAN:  Oluwakemi Salsberry L. Juanetta Gosling, M.D.DATE OF BIRTH:  Jun 22, 1935  DATE OF PROCEDURE: DATE OF DISCHARGE:                                PROGRESS NOTE   [name] was admitted with back pain.  She has multiple other medical problems.  She had an MRI of the back done yesterday and she does have a ruptured disk, but it does not match the anatomy of her problem.  Her problem is that she has right-sided pain mostly and the disk is on the left.  PHYSICAL EXAMINATION:  VITAL SIGNS:  Her temperature is 97.3, pulse 54, respirations 20, blood pressure 148/73, O2 sats 93% on room air. GENERAL:  She is still having significant problems with getting up and moving.  I do not see anything else definitive.  ASSESSMENT:  She has back pain.  She has a ruptured disk, but theanatomy of that does not match her pain.  I am going to plan to see if I can get Orthopedic consultation to see if there is something else we can do to help her.  She is going to continue with pain medications, continue with steroids, and hopefully we can get her a bit more comfortable.     Zoe [name] L. Juanetta Gosling, M.D.     ELH/MEDQ  D:  06/22/2010  T:  06/22/2010  Job:  [id number]  Electronically Signed by Kari Baars M.D. on 06/26/2010 08:27:18 AM

## 2010-06-26 NOTE — Group Therapy Note (Signed)
  NAMEMARICA, Zoe [name]              ACCOUNT NO.:  [id number]  MEDICAL RECORD NO.:  [id number]           PATIENT TYPE:  I  LOCATION:  A323                          FACILITY:  [facility]  PHYSICIAN:  Tinsleigh Slovacek L. Juanetta Gosling, M.D.DATE OF BIRTH:  [birth year]  DATE OF PROCEDURE:  06/24/2010 DATE OF DISCHARGE:                                PROGRESS NOTE   Zoe [name] says that she is doing a little bit better.  She is having less pain.  She has had to require the Dilaudid about twice yesterday. She has no other new complaints.  Her physical examination shows that she is awake and alert.  She looks more comfortable, but she apparently had a near fall yesterday.  Her exam otherwise temperature is 97.6, pulse 49, respirations 20, blood pressure 167/74, and O2 sat 95% on room air.  Her chest is clear.  Her heart is regular.  Her back is still very tender.  She still complains of pain when she stands, this is in the right leg.  Assessment then she has pain in her right leg which is radicular but she does not have a matching lesion on MRI.  She seems to be improving, so I am going to stop her IV medications and try to get her up and moving around a little bit more.  If she does better, I am hoping that she can be discharged home tomorrow.     Audry Pecina L. Juanetta Gosling, M.D.     ELH/MEDQ  D:  06/24/2010  T:  06/24/2010  Job:  [id number]  Electronically Signed by Kari Baars M.D. on 06/26/2010 08:27:15 AM

## 2010-06-26 NOTE — Group Therapy Note (Signed)
  NAMEOLAMAE, FERRARA              ACCOUNT NO.:  [id number]  MEDICAL RECORD NO.:  [id number]           PATIENT TYPE:  I  LOCATION:  A323                          FACILITY:  [facility]  PHYSICIAN:  Chermaine Schnyder L. Juanetta Gosling, M.D.DATE OF BIRTH:  March 19, 1935  DATE OF PROCEDURE: DATE OF DISCHARGE:                                PROGRESS NOTE   Ms. Butkiewicz says she is a little bit better.  She was admitted with back pain.  The problem has been that we have been unable to get a full evaluation of her back made because she is having so much pain.  She was unable to do an MRI because she was in too much pain to get it done. She has continued having pain but thinks that the use of Dilaudid has helped some.  She has no other new complaints.  She said that the steroids kept her awake last night.  PHYSICAL EXAMINATION:  VITAL SIGNS:  As recorded. CHEST:  Relatively clear. HEART:  Regular. NEUROLOGIC:  She does seem to be able to lie flatter than she was earlier.  ASSESSMENT:  She has low back pain.  I am concerned that this may be a disk problem and she has had difficulty with getting this worked up or getting control enough of the pain so we can make her comfortable.  She thinks the Dilaudid has helped some, so I am going to have her continue that.  I am going to see if we can give her some Dilaudid and Xanax and get the MRI done so that we will know what we need to do after that.  I do not plan to change anything else.     Kinta Martis L. Juanetta Gosling, M.D.     ELH/MEDQ  D:  06/21/2010  T:  06/22/2010  Job:  [id number]  Electronically Signed by Kari Baars M.D. on 06/26/2010 08:27:10 AM

## 2010-06-26 NOTE — Group Therapy Note (Signed)
  NAMESHADOE, CRYAN              ACCOUNT NO.:  [id number]  MEDICAL RECORD NO.:  [id number]           PATIENT TYPE:  I  LOCATION:  A323                          FACILITY:  [facility]  PHYSICIAN:  Evadne Ose L. Juanetta Gosling, M.D.DATE OF BIRTH:  1935/06/15  DATE OF PROCEDURE: DATE OF DISCHARGE:                                PROGRESS NOTE   Ms. Dearmond is doing a little better.  Dr. Romeo Apple saw her yesterday and increased her Neurontin slightly, put her back on Ultram, but it does not seem to have made much difference.  Her physical examination shows that she is awake and alert.  Temperature is 97.9, pulse 47, respirations 16, blood pressure 164/75, O2 sats 94% on room air.  She looks more comfortable, but is still having significant problems with her back.  This is particularly when she stands.  ASSESSMENT:  She has back pain.  It is radicular going down the right leg.  She has a new ruptured disk, but that is on the left, so it is not totally clear what is going on with all this.  My plan is to continue with her current treatments and follow.  I do not think there is anything else to add.  We will simply see if we can improve her situation with medications.     Caylynn Minchew L. Juanetta Gosling, M.D.     ELH/MEDQ  D:  06/23/2010  T:  06/23/2010  Job:  [id number]  Electronically Signed by Kari Baars M.D. on 06/26/2010 08:27:12 AM

## 2010-06-26 NOTE — Discharge Summary (Signed)
NAMELEXXI, KOSLOW              ACCOUNT NO.:  [id number]  MEDICAL RECORD NO.:  [id number]           PATIENT TYPE:  LOCATION:                                 FACILITY:  PHYSICIAN:  Urvi Imes L. Juanetta Gosling, M.D.DATE OF BIRTH:  1935-02-04  DATE OF ADMISSION: DATE OF DISCHARGE:  LH                              DISCHARGE SUMMARY   FINAL DISCHARGE DIAGNOSES: 1. Back and right radicular leg pain. 2. Abnormal MRI with ruptured disk to the left. 3. History of deep vein thrombosis. 4. History of gout. 5. History of osteoarthritis. 6. History of appendectomy. 7. History of cholecystectomy. 8. History of tubal ligation. 9. History of previous back surgery. 10.Hypertension. 11.Hyperlipidemia. 12.Allergies and asthma.  HISTORY:  Zoe [name] is a 75 year old who developed back pain about 2 weeks ago.  This actually started about 3-4 days after she fell.  She has had some problems with chronic back pain.  She has had multiple treatments at home which she has had trouble tolerating or she has had trouble with did not working.  She otherwise has been doing fairly well. She has not had any loss of continence of urine or stool.  PHYSICAL EXAMINATION ON ADMISSION:  GENERAL:  A well-developed, well- nourished female in no acute distress, but complaining of pain.  She did not have any abnormal reflexes. HEART:  Regular. BACK:  Mildly tender, but no definite severe abnormalities. EXTREMITIES:  No edema. CENTRAL NERVOUS SYSTEM:  Grossly intact.  ASSESSMENT:  At the time of admission was that she had back pain.  She had difficulty with tolerating MRI scanning, but after receiving Dilaudid and Xanax she was able to undergo an MRI scan, but it showed a lesion on the left which did not match her symptoms.  I had consultation obtained with Dr. Romeo Apple and she slowly improved, but still had significant pain.  Dr. Romeo Apple felt that she is going to need to be seen by the neurosurgeon and I think  that is appropriate.  She wants to go home and see if we can manage her pain to some extent at least at home.  She is going to be sent home on: 1. Gabapentin 300 mg 3 times a day which is an increase in dose from     twice a day earlier. 2. Doxepin 100 mg at bedtime. 3. Allopurinol 300 mg daily. 4. She had been on Uloric, but complained that the Uloric made her     crave sweets, so she is going to be switched back to allopurinol.     One of the problems with her being on allopurinol was that she did     not have good control of her uric acid level on allopurinol.  We     will have to watch that carefully. 5. She will continue with fish oil 1 capsule daily. 6. Ambien 5 mg at bedtime. 7. Xanax 0.5 mg.  She had been taking it at home b.i.d.  She will need     it q.i.d. on a p.r.n. basis, so I have written her a new     prescription to reflect that. 8.  She will continue with digoxin 0.25 mg at bedtime.  I am going to     have her get a digoxin level before she is discharged. 9. She will continue with senna 10 mg daily. 10.She will continue Soma 350 mg 4 times a day as needed.  She had     been taking it once a day.  I have written her a new prescription     to reflect the new dosing on this. 11.She will continue amlodipine 2.5 mg daily. 12.She will continue with artificial tears as needed. 13.She will continue pravastatin 20 mg daily. 14.Multiple vitamins 1 daily. 15.She will continue aspirin 81 mg daily. 16.Naproxen 500 mg daily as needed. 17.Allegra 180 mg daily as needed.  She actually takes one fourth     tablet. 18.Albuterol inhaler daily on a p.r.n. basis. 19.She will continue with vitamin B12 one capsule daily. 20.Vitamin D 1000 mg daily. 21.Biotin 1000 mcg daily. 22.Vitamin C 500 mg 2 tablets daily. 23.She has already been taking Ultram, but that is not listed on her     admission medications.  She will take 50 mg q.4 h. p.r.n. pain. 24.She will take Dilaudid 2 mg 1/2 a  tablet daily as needed for pain.     I gave her prescription for #60 with of course no refills. 25.She will take prednisone 10 mg 4 tablets x3 days, then 3 tablets x3     days, then 2 tablets x3 days, then 1 tablet x3 days, and then stop.  She will follow up in my office in about 2 weeks.  We are going to get Home Health to see her with physical therapy.  I am going to try to get her into one of the neurosurgeons.     Dinorah Masullo L. Juanetta Gosling, M.D.     ELH/MEDQ  D:  06/25/2010  T:  06/25/2010  Job:  [id number]  cc:   [facility] [address] [place], [place] [address]  Vickki Hearing, M.D. Fax: [phone]  Electronically Signed by Kari Baars M.D. on 06/26/2010 08:27:05 AM

## 2010-06-26 NOTE — Group Therapy Note (Signed)
  Zoe [name]              ACCOUNT NO.:  [id number]  MEDICAL RECORD NO.:  [id number]           PATIENT TYPE:  I  LOCATION:  A323                          FACILITY:  [facility]  PHYSICIAN:  Kaveri Perras L. Juanetta Gosling, M.D.DATE OF BIRTH:  01/01/1936  DATE OF PROCEDURE:  06/25/2010 DATE OF DISCHARGE:  06/25/2010                                PROGRESS NOTE   HISTORY OF PRESENT ILLNESS:  [name] is overall about the same. She has been switched to p.o. meds and done about as well p.o. she did IV.  She says that she has no complaints.  PHYSICAL EXAMINATION:  VITAL SIGNS:  Her temperature is 97.6, pulse got down in the 40s while she was asleep, respirations 17, blood pressure 144/79, and O2 sats 96% on room air. GENERAL:  She still complains of pain when she ambulates but overall I think she is better.  ASSESSMENT:  She is better.  PLAN:  To continue with current treatments and medications.  She is going to be discharged home today.  Please see discharge summary for details.     Rolf Fells L. Juanetta Gosling, M.D.     ELH/MEDQ  D:  06/25/2010  T:  06/25/2010  Job:  [id number]  Electronically Signed by Kari Baars M.D. on 06/26/2010 08:27:20 AM

## 2010-07-01 NOTE — Consult Note (Signed)
  NAME:  Zoe [name], Zoe [name]              ACCOUNT NO.:  [id number]  MEDICAL RECORD NO.:  [id number]           PATIENT TYPE:  LOCATION:                                 FACILITY:  PHYSICIAN:  Vickki Hearing, M.D.DATE OF BIRTH:  Mar 28, 1935  DATE OF CONSULTATION: DATE OF DISCHARGE:                                CONSULTATION   A 75 year old female with right lower extremity radicular symptoms, history of bilateral hip arthritis, right knee arthritis, spinal stenosis, known to my practice, followed for 1-2 years with these diagnoses, presented with increasing right leg pain and inability to stand in an erect position after a fall onto her right side.  Dr. Juanetta Gosling is asked to for consultation.  The chart has been reviewed.  The imaging studies have been reviewed including lumbar spine MRI, right tib-fib x-ray, ultrasound of the lower extremity, lumbar spine plain film on Jun 06, 2010, compared to MRI dated December 2006.  The patient has multilevel degenerative disk disease and degenerative spinal stenosis.  She is already on steroids, IV.  She is on Neurontin 300 mg twice a day. I have increased that to 3 times a day.  I have added Ultram which is the only oral analgesic that she can tolerate without extreme abdominal pain and we also added a K-pad for heat to be applied to her back every 8 hours for 30 minutes.  She should continue IV steroids and when she is more comfortable and ambulatory a neurosurgical consult will be needed.  I will continue to follow until discharge.     Vickki Hearing, M.D.     SEH/MEDQ  D:  06/22/2010  T:  06/22/2010  Job:  [id number]  Electronically Signed by Fuller Canada M.D. on 07/01/2010 02:41:45 PM

## 2010-08-01 ENCOUNTER — Other Ambulatory Visit (HOSPITAL_COMMUNITY): Payer: Self-pay | Admitting: Pulmonary Disease

## 2010-08-01 DIAGNOSIS — R51 Headache: Secondary | ICD-10-CM

## 2010-08-01 DIAGNOSIS — R110 Nausea: Secondary | ICD-10-CM

## 2010-08-02 ENCOUNTER — Ambulatory Visit (HOSPITAL_COMMUNITY)
Admission: RE | Admit: 2010-08-02 | Discharge: 2010-08-02 | Disposition: A | Discharge status: Home / Self Care | Payer: Medicare Other | Source: Ambulatory Visit | Attending: Pulmonary Disease | Admitting: Pulmonary Disease

## 2010-08-02 DIAGNOSIS — R112 Nausea with vomiting, unspecified: Secondary | ICD-10-CM | POA: Insufficient documentation

## 2010-08-02 DIAGNOSIS — S0990XA Unspecified injury of head, initial encounter: Secondary | ICD-10-CM | POA: Insufficient documentation

## 2010-08-02 DIAGNOSIS — I10 Essential (primary) hypertension: Secondary | ICD-10-CM | POA: Insufficient documentation

## 2010-08-02 DIAGNOSIS — R51 Headache: Secondary | ICD-10-CM | POA: Insufficient documentation

## 2010-08-02 DIAGNOSIS — IMO0002 Reserved for concepts with insufficient information to code with codable children: Secondary | ICD-10-CM | POA: Insufficient documentation

## 2010-09-05 ENCOUNTER — Encounter (INDEPENDENT_AMBULATORY_CARE_PROVIDER_SITE_OTHER): Payer: Self-pay

## 2010-09-10 ENCOUNTER — Other Ambulatory Visit (HOSPITAL_COMMUNITY): Payer: Self-pay | Admitting: Pulmonary Disease

## 2010-09-10 DIAGNOSIS — G459 Transient cerebral ischemic attack, unspecified: Secondary | ICD-10-CM

## 2010-09-16 ENCOUNTER — Ambulatory Visit (HOSPITAL_COMMUNITY): Payer: Medicare Other

## 2010-09-17 ENCOUNTER — Ambulatory Visit (HOSPITAL_COMMUNITY): Payer: Medicare Other

## 2010-09-20 ENCOUNTER — Ambulatory Visit (HOSPITAL_COMMUNITY)
Admission: RE | Admit: 2010-09-20 | Discharge: 2010-09-20 | Disposition: A | Discharge status: Home / Self Care | Payer: Medicare Other | Source: Ambulatory Visit | Attending: Pulmonary Disease | Admitting: Pulmonary Disease

## 2010-09-20 DIAGNOSIS — G459 Transient cerebral ischemic attack, unspecified: Secondary | ICD-10-CM

## 2010-09-20 DIAGNOSIS — R55 Syncope and collapse: Secondary | ICD-10-CM | POA: Insufficient documentation

## 2010-09-20 DIAGNOSIS — H538 Other visual disturbances: Secondary | ICD-10-CM | POA: Insufficient documentation

## 2010-09-20 DIAGNOSIS — I10 Essential (primary) hypertension: Secondary | ICD-10-CM | POA: Insufficient documentation

## 2010-09-20 NOTE — Progress Notes (Signed)
*  PRELIMINARY RESULTS* Echocardiogram 2D Echocardiogram has been performed.  Jorje Guild [facility] 09/20/2010, 3:25 PM

## 2010-11-19 ENCOUNTER — Ambulatory Visit (INDEPENDENT_AMBULATORY_CARE_PROVIDER_SITE_OTHER): Payer: Medicare Other | Admitting: Internal Medicine

## 2010-11-19 ENCOUNTER — Encounter (INDEPENDENT_AMBULATORY_CARE_PROVIDER_SITE_OTHER): Payer: Self-pay | Admitting: Internal Medicine

## 2010-11-19 ENCOUNTER — Encounter (INDEPENDENT_AMBULATORY_CARE_PROVIDER_SITE_OTHER): Payer: Self-pay | Admitting: *Deleted

## 2010-11-19 DIAGNOSIS — K219 Gastro-esophageal reflux disease without esophagitis: Secondary | ICD-10-CM

## 2010-11-19 DIAGNOSIS — K5909 Other constipation: Secondary | ICD-10-CM | POA: Insufficient documentation

## 2010-11-19 DIAGNOSIS — K5900 Constipation, unspecified: Secondary | ICD-10-CM

## 2010-11-19 NOTE — Telephone Encounter (Signed)
This encounter was created in error - please disregard.

## 2010-11-19 NOTE — Patient Instructions (Signed)
Discontinue Senokot. Amitiza 24 mcg by mouth twice daily. Call if you have any side effects. If this medication works we will call in a prescription when you run out of samples

## 2010-11-19 NOTE — Progress Notes (Signed)
Presenting complaint; followup for chronic constipation and GERD. Last visit 02/04/2010. Subjective; patient is 75 year old Caucasian female patient of Dr. Juanetta Gosling present for GI followup. She says Prilosec OTC generally works well to control her heartburn. She is concerned about the risk of osteoporosis. She tried MiraLAX following her last visit and it did not work. She is back to taking 10 tablets of Senokot a day. She has been taking Senokot for well over 30 years. She has occasional the throbbing pain in left lower quadrant of her abdomen. She denies melena or rectal bleeding. Her weight has been stable. She has multiple other complaints which include blurred vision,  dry mouth, back pain radiating to her lower extremities more in the right leg and she is also losing muscle mass in this leg. She saw a spine specialist in [place] had an MRI. Myelogram was recommended she has been reluctant now ready to proceed with it. Current medications reviewed with the patient. List as follows Current Outpatient Prescriptions  Medication Sig Dispense Refill  . ALPRAZolam (XANAX) 0.5 MG tablet Take 0.5 mg by mouth. Takes 2-3 per week      . amLODipine (NORVASC) 2.5 MG tablet Take 2.5 mg by mouth daily.        . Ascorbic Acid (VITAMIN C) 100 MG tablet Take 500 mg by mouth daily.        Marland Kitchen aspirin 81 MG tablet Take 81 mg by mouth daily.        . Biotin 10 MG TABS Take 5,000 mg by mouth.       . cholecalciferol (VITAMIN D) 1000 UNITS tablet Take 1,000 Units by mouth 3 (three) times daily.        . Cyanocobalamin (B-12) 1000 MCG CAPS Take by mouth.        . digoxin (LANOXIN) 0.25 MG tablet Take 250 mcg by mouth daily.        Marland Kitchen doxepin (SINEQUAN) 100 MG capsule Take 100 mg by mouth at bedtime.        . febuxostat (ULORIC) 40 MG tablet Take 80 mg by mouth daily.        . fexofenadine (ALLEGRA) 60 MG tablet Take 60 mg by mouth daily. Patient takes 15 mg daily      . fish oil-omega-3 fatty acids 1000 MG capsule Take  1 g by mouth daily.        Marland Kitchen gabapentin (NEURONTIN) 300 MG capsule Take 300 mg by mouth 2 (two) times daily.       . multivitamin (THERAGRAN) per tablet Take 1 tablet by mouth daily.        . naproxen (NAPROSYN) 500 MG tablet Take 500 mg by mouth 2 (two) times daily with a meal.        . omeprazole (PRILOSEC OTC) 20 MG tablet Take 20 mg by mouth daily.        . pravastatin (PRAVACHOL) 20 MG tablet Take 20 mg by mouth daily.        . predniSONE (DELTASONE) 5 MG tablet Take 5 mg by mouth daily.        . quiNINE (QUALAQUIN) 324 MG capsule Take by mouth Nightly.       . senna (SENOKOT) 8.6 MG tablet Take 10 tablets by mouth daily.       Marland Kitchen zolpidem (AMBIEN CR) 12.5 MG CR tablet Take by mouth at bedtime as needed.         Objective BP 120/70  Pulse 76  Temp(Src) 97.2 F (  36.2 C) (Oral)  Resp 14  Ht 5\' 7"  (1.702 m)  Wt 175 lb (79.379 kg)  BMI 27.41 kg/m2 Conjunctiva is pink. Sclerae nonicteric.  Oropharyngeal mucosa is normal No neck masses or thyromegaly noted. Abdomen is symmetrical soft and nontender without organomegaly or masses. No peripheral edema or clubbing noted. Assessment # 1. Chronic GERD. She is doing reasonably well with Prilosec OTC. I doubt that she would be able to do without. Am afraid prednisone and lack of physical activity is a bigger risk to osteoporosis then PPI.   #2. Chronic constipation. Usual therapies have failed in the past. She is taking huge doses of Senokot. Will try her on Amitiza. Plan Discontinue Senokot. Amitiza 24 mcg twice daily. Samples given. If it works we'll call in a prescription. I asked patient to talk with Dr. Juanetta Gosling if she could come off prednisone and some other medicines and she is having multiple side effects. Patient advised not to take anymore NSAIDs as she is already taking a gram of naproxen per day Office visit in 6 months

## 2011-03-24 ENCOUNTER — Ambulatory Visit (HOSPITAL_COMMUNITY)
Admission: RE | Admit: 2011-03-24 | Discharge: 2011-03-24 | Disposition: A | Discharge status: Home / Self Care | Payer: Medicare Other | Source: Ambulatory Visit | Attending: Rehabilitation | Admitting: Rehabilitation

## 2011-03-24 ENCOUNTER — Ambulatory Visit (HOSPITAL_COMMUNITY)
Admission: RE | Admit: 2011-03-24 | Discharge: 2011-03-24 | Disposition: A | Discharge status: Home / Self Care | Payer: Medicare Other | Source: Ambulatory Visit | Attending: Family Medicine | Admitting: Family Medicine

## 2011-03-24 DIAGNOSIS — IMO0001 Reserved for inherently not codable concepts without codable children: Secondary | ICD-10-CM | POA: Insufficient documentation

## 2011-03-24 DIAGNOSIS — R29898 Other symptoms and signs involving the musculoskeletal system: Secondary | ICD-10-CM | POA: Insufficient documentation

## 2011-03-24 DIAGNOSIS — M545 Low back pain, unspecified: Secondary | ICD-10-CM | POA: Insufficient documentation

## 2011-03-24 DIAGNOSIS — M542 Cervicalgia: Secondary | ICD-10-CM | POA: Insufficient documentation

## 2011-03-24 DIAGNOSIS — M25619 Stiffness of unspecified shoulder, not elsewhere classified: Secondary | ICD-10-CM | POA: Insufficient documentation

## 2011-03-24 DIAGNOSIS — M6281 Muscle weakness (generalized): Secondary | ICD-10-CM | POA: Insufficient documentation

## 2011-03-24 DIAGNOSIS — M25519 Pain in unspecified shoulder: Secondary | ICD-10-CM | POA: Insufficient documentation

## 2011-03-24 DIAGNOSIS — R269 Unspecified abnormalities of gait and mobility: Secondary | ICD-10-CM | POA: Insufficient documentation

## 2011-03-24 DIAGNOSIS — M1990 Unspecified osteoarthritis, unspecified site: Secondary | ICD-10-CM | POA: Insufficient documentation

## 2011-03-24 DIAGNOSIS — M436 Torticollis: Secondary | ICD-10-CM | POA: Insufficient documentation

## 2011-03-24 DIAGNOSIS — M25559 Pain in unspecified hip: Secondary | ICD-10-CM | POA: Insufficient documentation
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
## 2011-03-24 NOTE — Patient Instructions (Addendum)
HEP

## 2011-03-27 ENCOUNTER — Ambulatory Visit (HOSPITAL_COMMUNITY): Payer: Medicare Other | Admitting: *Deleted

## 2011-03-27 ENCOUNTER — Ambulatory Visit (HOSPITAL_COMMUNITY)
Admission: RE | Admit: 2011-03-27 | Discharge: 2011-03-27 | Disposition: A | Discharge status: Home / Self Care | Payer: Medicare Other | Source: Ambulatory Visit | Attending: Pulmonary Disease | Admitting: Pulmonary Disease

## 2011-03-27 DIAGNOSIS — R29898 Other symptoms and signs involving the musculoskeletal system: Secondary | ICD-10-CM

## 2011-03-27 DIAGNOSIS — M436 Torticollis: Secondary | ICD-10-CM

## 2011-03-27 DIAGNOSIS — M1990 Unspecified osteoarthritis, unspecified site: Secondary | ICD-10-CM

## 2011-03-27 NOTE — Progress Notes (Signed)
Physical Therapy Treatment Patient Details  Name: Zoe [name] MRN: [id number] Date of Birth: 09-09-1935  Today's Date: 03/27/2011 Time: 1324-4010 Time Calculation (min): 44 min Visit#: 2  of 8   Re-eval: 04/23/11  Charge: therex: 25 min MHP 15 min  Subjective: Symptoms/Limitations Symptoms: Pt stated neck pain with headache increased with shopping earlier today pain scale 7-8/10 Pain Assessment Currently in Pain?: Yes Pain Score:   8 Pain Location: Neck Pain Orientation: Left;Posterior Pain Radiating Towards: radiating pain from R knee down calf.  Objective:   Exercise/Treatments Neck Exercises Neck Retraction: 10 reps;Seated Neck Extension: 10 reps;Seated;Limitations Neck Extension Limitations: isometric Neck Lateral Flexion - Right: 10 reps;Seated;Limitations Neck Lateral Flexion - Right Limitations: isometric Neck Lateral Flexion - Left: 10 reps;Seated;Limitations Neck Lateral Flexion - Left Limitations: isometric Scapular Retraction: Limitations;10 reps Scapular Retraction Limitations: shrug up/retract now relax  10 W Back: Seated;10 reps X to V: Seated;10 reps Additional Neck Exercises UBE (Upper Arm Bike): 4' @ 1.0 backwards  Modalities Modalities: Moist Heat Moist Heat Therapy Number Minutes Moist Heat: 15 Minutes Moist Heat Location:  (neck)  Physical Therapy Assessment and Plan PT Assessment and Plan Clinical Impression Statement: Began new postural strengthening exercises per PT plan, pt's shoulder ROM limited by pain with x to v exercise.  Constant cueing required for posture with seated exercises.  Pain reduced at end of session with MHP.   PT Plan: Continue wth current POC, next session add chest stretch; T-Band ex for c stability; supine chin tucks.    Goals    Problem List Patient Active Problem List  Diagnoses  . HIP PAIN  . KNEE PAIN  . DEGEN LUMBAR/LUMBOSACRAL INTERVERTEBRAL DISC  . SPINAL STENOSIS  . SCIATICA  . ANSERINE BURSITIS,  RIGHT  . Chronic constipation  . GERD (gastroesophageal reflux disease)  . Weakness of right leg  . OA (osteoarthritis)  . Stiffness of cervical spine    PT - End of Session Activity Tolerance: Patient tolerated treatment well;Patient limited by pain General Behavior During Session: [facility] for tasks performed Cognition: [facility] for tasks performed  Juel Burrow, PTA 03/27/2011, 3:19 PM

## 2011-03-27 NOTE — Progress Notes (Signed)
Physical Therapy Treatment Patient Details  Name: Zoe [name] MRN: [id number] Date of Birth: 18-May-1935  Today's Date: 03/27/2011 Time: 7829-5621 Time Calculation (min): 38 min Visit#: 2  of 8   Re-eval: 04/23/11  Charge: therex 32 min  Subjective: Symptoms/Limitations Symptoms: Pt stated she woke up 4 am with charlie horses B inner thigh, pain scale 8/10. Pain Assessment Currently in Pain?: Yes Pain Score:   8 Pain Location: Knee Pain Radiating Towards: radiating pain from R knee down calf.  Objective:   Exercise/Treatments Aerobic Stationary Bike: Schwinn bike 6' Standing Heel Raises: 10 reps Functional Squat: 10 reps;3 seconds;Limitations Functional Squat Limitations: minisquats SLS: L 10" max of 3, R 2x 30" with HHA Supine Bridges: 10 reps Other Supine Knee Exercises: clam supine 10 reps Other Supine Knee Exercises: Bent knee raises 10x 3"     Physical Therapy Assessment and Plan PT Assessment and Plan Clinical Impression Statement: Began new hip strengthening exercises with manual cueing for stability.  Pt limited by weakness B R>L and  with R knee pain.  Pt instructed to add bridges, clam and bent knee raise to HEP, pt stated she already has worksheet.   PT Plan: Continue with current POC, continue strengthening and balance, beging gait training on TM; hip isometric and side-lying abduction next session.    Goals    Problem List Patient Active Problem List  Diagnoses  . HIP PAIN  . KNEE PAIN  . DEGEN LUMBAR/LUMBOSACRAL INTERVERTEBRAL DISC  . SPINAL STENOSIS  . SCIATICA  . ANSERINE BURSITIS, RIGHT  . Chronic constipation  . GERD (gastroesophageal reflux disease)  . Weakness of right leg  . OA (osteoarthritis)  . Stiffness of cervical spine    PT - End of Session Activity Tolerance: Patient tolerated treatment well General Behavior During Session: [facility] for tasks performed Cognition: [facility] for tasks performed  Juel Burrow, PTA 03/27/2011,  2:44 PM

## 2011-04-01 ENCOUNTER — Ambulatory Visit (HOSPITAL_COMMUNITY): Payer: Medicare Other | Admitting: *Deleted

## 2011-04-01 ENCOUNTER — Ambulatory Visit (HOSPITAL_COMMUNITY)
Admission: RE | Admit: 2011-04-01 | Discharge: 2011-04-01 | Disposition: A | Discharge status: Home / Self Care | Payer: Medicare Other | Source: Ambulatory Visit | Attending: Pulmonary Disease | Admitting: Pulmonary Disease

## 2011-04-01 DIAGNOSIS — M542 Cervicalgia: Secondary | ICD-10-CM | POA: Insufficient documentation

## 2011-04-01 DIAGNOSIS — R29898 Other symptoms and signs involving the musculoskeletal system: Secondary | ICD-10-CM

## 2011-04-01 DIAGNOSIS — IMO0001 Reserved for inherently not codable concepts without codable children: Secondary | ICD-10-CM | POA: Insufficient documentation

## 2011-04-01 DIAGNOSIS — M25519 Pain in unspecified shoulder: Secondary | ICD-10-CM | POA: Insufficient documentation

## 2011-04-01 DIAGNOSIS — M1990 Unspecified osteoarthritis, unspecified site: Secondary | ICD-10-CM

## 2011-04-01 DIAGNOSIS — M25619 Stiffness of unspecified shoulder, not elsewhere classified: Secondary | ICD-10-CM | POA: Insufficient documentation

## 2011-04-01 DIAGNOSIS — M6281 Muscle weakness (generalized): Secondary | ICD-10-CM | POA: Insufficient documentation

## 2011-04-01 DIAGNOSIS — M436 Torticollis: Secondary | ICD-10-CM

## 2011-04-01 NOTE — Progress Notes (Signed)
Physical Therapy Treatment Patient Details  Name: Zoe [name] MRN: [id number] Date of Birth: 01/15/1936  Today's Date: 04/01/2011 Time: 7829-5621 Time Calculation (min): 47 min Visit#: 3  of 8   Re-eval: 04/23/11  Charge: therex: 32 min MHP neck/Ice back 1 unit  Subjective: Symptoms/Limitations Symptoms: Pt stated sciatica L side pain 7/10. Pain Assessment Currently in Pain?: Yes Pain Score:   7 Pain Location: Buttocks (sciatica pain L ) Pain Orientation: Left  Objective:   Exercise/Treatments Aerobic Tread Mill: 3' @ 1.2  Standing Heel Raises: 20 reps Functional Squat: 15 reps;Limitations Functional Squat Limitations: minisquats Supine Bridges: Limitations Bridges Limitations: resume next session Other Supine Knee Exercises: isometric hip flexion begin next session Sidelying Hip ABduction: 10 reps;Limitations Hip ABduction Limitations: bilateral Clams: 10 x 3" Prone  Hip Extension: 10 reps;Limitations Hip Extension Limitations: Min assistance with R LE Other Prone Exercises: heel squeeze 5x 5"   Modalities Modalities: Moist Heat Moist Heat Therapy Number Minutes Moist Heat: 15 Minutes Moist Heat Location: Other (comment) (MHP neck supine, Ice LB supine with feet elevated) Cryotherapy Number Minutes Cryotherapy: 15 Minutes Cryotherapy Location: Back Type of Cryotherapy: Ice pack  Physical Therapy Assessment and Plan PT Assessment and Plan Clinical Impression Statement: Began gait training on treadmill, pt with antalgic gait limited by pain and endurance.  Max cueing required for posture through out session.  Added prone exercise for postural and gluteal strengthening, completed correctly but limtied by pain.  Ended session with MHP to L neck/shoulder and ice to L gluteal region, pt stated pain reduced at end. PT Plan: Next session begin supine isometric hip flexion, floating SLR; sidelying mini flex,/ext and hip circles.      Goals    Problem  List Patient Active Problem List  Diagnoses  . HIP PAIN  . KNEE PAIN  . DEGEN LUMBAR/LUMBOSACRAL INTERVERTEBRAL DISC  . SPINAL STENOSIS  . SCIATICA  . ANSERINE BURSITIS, RIGHT  . Chronic constipation  . GERD (gastroesophageal reflux disease)  . Weakness of right leg  . OA (osteoarthritis)  . Stiffness of cervical spine    PT - End of Session Activity Tolerance: Patient tolerated treatment well;Patient limited by pain General Behavior During Session: [facility] for tasks performed Cognition: [facility] for tasks performed     Juel Burrow 04/01/2011, 4:28 PM

## 2011-04-01 NOTE — Progress Notes (Signed)
Physical Therapy Treatment Patient Details  Name: Zoe [name] MRN: [id number] Date of Birth: September 20, 1935  Today's Date: 04/01/2011 Time: 7829-5621 Time Calculation (min): 18 min Visit#: 3  of 8   Re-eval: 04/23/11  Charge: therex 18 min MHP/Ice completed following the hip PT session.  Subjective: Symptoms/Limitations Symptoms: Pt late for appt.  Cleaned around the house with increased pain L neck/scapula pain scale 8/10.  Pain Assessment Currently in Pain?: Yes Pain Score:   8 Pain Location: Neck Pain Orientation: Left;Posterior  Objective:   Exercise/Treatments Stretches Corner Stretch: 3 reps;20 seconds Shoulder Rolls: shrug up/retract now relax  10 Neck Exercises Neck Retraction: 10 reps;Seated Shoulder Extension: 10 reps;Standing;Theraband Theraband Level (Shoulder Extension): Level 2 (Red) Row: 10 reps;Standing;Theraband Theraband Level (Row): Level 2 (Red) Scapular Retraction: 5 reps;Standing;Theraband;Limitations Theraband Level (Scapular Retraction): Level 2 (Red) Scapular Retraction Limitations: with manual assistance W Back: Seated;10 reps X to V: Seated;10 reps  Modalities Modalities: Moist Heat;Cryotherapy Moist Heat Therapy Number Minutes Moist Heat: 15 Minutes Moist Heat Location:  (MHP neck supine, Ice LB supine with feet elevated) Cryotherapy Number Minutes Cryotherapy: 15 Minutes Cryotherapy Location: Back Type of Cryotherapy: Ice pack  Physical Therapy Assessment and Plan PT Assessment and Plan Clinical Impression Statement: Began postural strengthening therex with red tband, pt limited by pain and weakness with all movements.  Constant cueing required for posture with seated and standing exercises. PT Plan: Continue with current POC, next session begin supine chin tucks, wall pushups; prone chin tucks head lifts; scapular retraction.      Goals    Problem List Patient Active Problem List  Diagnoses  . HIP PAIN  . KNEE PAIN  . DEGEN  LUMBAR/LUMBOSACRAL INTERVERTEBRAL DISC  . SPINAL STENOSIS  . SCIATICA  . ANSERINE BURSITIS, RIGHT  . Chronic constipation  . GERD (gastroesophageal reflux disease)  . Weakness of right leg  . OA (osteoarthritis)  . Stiffness of cervical spine    PT - End of Session Activity Tolerance: Patient tolerated treatment well;Patient limited by pain General Behavior During Session: [facility] for tasks performed Cognition: [facility] for tasks performed  Juel Burrow, PTA 04/01/2011, 4:00 PM

## 2011-04-03 ENCOUNTER — Ambulatory Visit (HOSPITAL_COMMUNITY): Payer: Medicare Other | Admitting: Physical Therapy

## 2011-04-03 ENCOUNTER — Ambulatory Visit (HOSPITAL_COMMUNITY): Payer: Medicare Other | Admitting: *Deleted

## 2011-04-08 ENCOUNTER — Ambulatory Visit (HOSPITAL_COMMUNITY)
Admission: RE | Admit: 2011-04-08 | Discharge: 2011-04-08 | Disposition: A | Discharge status: Home / Self Care | Payer: Medicare Other | Source: Ambulatory Visit | Attending: Rehabilitation | Admitting: Rehabilitation

## 2011-04-08 ENCOUNTER — Ambulatory Visit (HOSPITAL_COMMUNITY)
Admission: RE | Admit: 2011-04-08 | Discharge: 2011-04-08 | Disposition: A | Discharge status: Home / Self Care | Payer: Medicare Other | Source: Ambulatory Visit | Attending: Pulmonary Disease | Admitting: Pulmonary Disease

## 2011-04-08 ENCOUNTER — Ambulatory Visit (HOSPITAL_COMMUNITY): Payer: Medicare Other | Admitting: *Deleted

## 2011-04-08 NOTE — Progress Notes (Signed)
Physical Therapy Treatment Patient Details  Name: Zoe [name] MRN: [id number] Date of Birth: [birth year]  Today's Date: 04/08/2011 Time: 7829-5621 Time Calculation (min): 26 min Visit#: 4  of 8   Re-eval: 04/23/11 Charges: Therex x 24'  Subjective: Symptoms/Limitations Symptoms: I hurt so bad after last time. I'm going to get an injection in my back next Tuesday. Pain Assessment Pain Score:   8 Pain Location: Buttocks (sciatica pain L ) Pain Orientation: Left   Exercise/Treatments Stability Clam: 10 reps Bridge: 10 reps;Limitations Bridge Limitations: with ab sets Ab Set: 10 reps Hip Abduction: 5 reps;Side-lying Heel Squeeze: 10 reps;5 seconds Leg Raise: Prone;5 reps Lifting: Limitations Lifting Limitations: Glute set prone 10x 5"  Modalities Modalities: Moist Heat Moist Heat Therapy Number Minutes Moist Heat: 15 Minutes Moist Heat Location: Other (comment) (MHP neck supine, Ice LB supine with feet elevated) Cryotherapy Number Minutes Cryotherapy: 15 Minutes Cryotherapy Location: Back Type of Cryotherapy: Ice pack  Physical Therapy Assessment and Plan PT Assessment and Plan Clinical Impression Statement: All standing exercises held this session secondary to pain. Pt unable to complete bent knee raise secondary to R knee pain. Pt appears to have some relief in prone position.  PT Plan: Return to standing exercises as pain allows.     Problem List Patient Active Problem List  Diagnoses  . HIP PAIN  . KNEE PAIN  . DEGEN LUMBAR/LUMBOSACRAL INTERVERTEBRAL DISC  . SPINAL STENOSIS  . SCIATICA  . ANSERINE BURSITIS, RIGHT  . Chronic constipation  . GERD (gastroesophageal reflux disease)  . Weakness of right leg  . OA (osteoarthritis)  . Stiffness of cervical spine    PT - End of Session Activity Tolerance: Patient tolerated treatment well;Patient limited by pain General Behavior During Session: [facility] for tasks performed Cognition: [facility] for tasks  performed   Seth Bake, PTA 04/08/2011, 4:42 PM

## 2011-04-08 NOTE — Progress Notes (Addendum)
Physical Therapy Treatment Patient Details  Name: Zoe [name] MRN: [id number] Date of Birth: 03/02/1935  Today's Date: 04/08/2011 Time: 4098-1191 Time Calculation (min): 30 min Visit#: 4  of 8   Re-eval: 04/23/11 Charges: Therex x 10' Ice/heat x 15'  Subjective: Symptoms/Limitations Symptoms: I hurt all over after last session. Pain Assessment Currently in Pain?: Yes Pain Score:   5 Pain Location: Neck Pain Orientation: Left    Exercise/Treatments Neck Exercises Neck Retraction: 10 reps;Seated W Back: 10 reps X to V: 5 reps  Modalities Modalities: Moist Heat Moist Heat Therapy Number Minutes Moist Heat: 15 Minutes Moist Heat Location: Other (comment) (MHP neck supine, Ice LB supine with feet elevated) Cryotherapy Number Minutes Cryotherapy: 15 Minutes Cryotherapy Location: Back Type of Cryotherapy: Ice pack  Physical Therapy Assessment and Plan PT Assessment and Plan Clinical Impression Statement: Pt completes all neck exercises without difficulty. Standing exercises not completed secondary to hip pain. Pt reports neck pain decrease t o4/10 at end of session.Pt completes all neck exercises without difficulty. Standing exercises not completed secondary to hip pain. Pt reports neck pain decrease t o4/10 at end of session. PT Plan: Continue to progress per PT POC.     Problem List Patient Active Problem List  Diagnoses  . HIP PAIN  . KNEE PAIN  . DEGEN LUMBAR/LUMBOSACRAL INTERVERTEBRAL DISC  . SPINAL STENOSIS  . SCIATICA  . ANSERINE BURSITIS, RIGHT  . Chronic constipation  . GERD (gastroesophageal reflux disease)  . Weakness of right leg  . OA (osteoarthritis)  . Stiffness of cervical spine    PT - End of Session Activity Tolerance: Patient tolerated treatment well General Behavior During Session: [facility] for tasks performed Cognition: [facility] for tasks performed    Seth Bake, PTA 04/08/2011, 4:50 PM

## 2011-04-10 ENCOUNTER — Ambulatory Visit (HOSPITAL_COMMUNITY)
Admission: RE | Admit: 2011-04-10 | Discharge: 2011-04-10 | Disposition: A | Discharge status: Home / Self Care | Payer: Medicare Other | Source: Ambulatory Visit | Attending: Pulmonary Disease | Admitting: Pulmonary Disease

## 2011-04-10 ENCOUNTER — Ambulatory Visit (HOSPITAL_COMMUNITY): Payer: Medicare Other | Admitting: Physical Therapy

## 2011-04-10 NOTE — Progress Notes (Signed)
Physical Therapy Treatment Patient Details  Name: Zoe [name] MRN: [id number] Date of Birth: 1936/01/26  Today's Date: 04/10/2011 Time: 1027-2536 Time Calculation (min): 13 min Visit#: 6  of 8   Re-eval: 04/23/11 Charges: Therex 11'  Subjective: Symptoms/Limitations Symptoms: Pt reports neck pain at 5/10. Pain Assessment Currently in Pain?: Yes Pain Score:   5 Pain Location: Neck Pain Orientation: Right   Exercise/Treatments Stretches Corner Stretch: 3 reps;20 seconds Shoulder Rolls: shrug up/retract now relax x 10 Neck Exercises Neck Retraction: 10 reps;Seated Shoulder Extension: 10 reps;Standing;Theraband Theraband Level (Shoulder Extension): Level 2 (Red) Row: 10 reps;Standing;Theraband Theraband Level (Row): Level 2 (Red) Scapular Retraction: 5 reps;Standing;Theraband;Limitations Theraband Level (Scapular Retraction): Level 2 (Red) W Back: 10 reps X to V: 5 reps  Manual Therapy Manual Therapy: Other (comment) Other Manual Therapy: Sciatic nerve glides LLE x 5'  Physical Therapy Assessment and Plan PT Assessment and Plan Clinical Impression Statement: Pt completes all cervical exercises with minimal difficulty. Pt did complain of some "popping" in her neck while completing x-v. This subsided once exercise was modified. Pt require multimodal cueing to correctly complete scapular tband exercises. PT Plan: Continue to progress per PT POC     Problem List Patient Active Problem List  Diagnoses  . HIP PAIN  . KNEE PAIN  . DEGEN LUMBAR/LUMBOSACRAL INTERVERTEBRAL DISC  . SPINAL STENOSIS  . SCIATICA  . ANSERINE BURSITIS, RIGHT  . Chronic constipation  . GERD (gastroesophageal reflux disease)  . Weakness of right leg  . OA (osteoarthritis)  . Stiffness of cervical spine    PT - End of Session Activity Tolerance: Patient tolerated treatment well General Behavior During Session: [facility] for tasks performed Cognition: [facility] for tasks performed    Seth Bake, PTA 04/10/2011, 4:19 PM

## 2011-04-10 NOTE — Progress Notes (Addendum)
Physical Therapy Treatment Patient Details  Name: Zoe [name] MRN: [id number] Date of Birth: 04/17/1935  Today's Date: 04/10/2011 Time: 4098-1191 Time Calculation (min): 29 min Visit#: 5  of 8   Re-eval: 04/23/11 Charges: Therex x 27'  Subjective: Symptoms/Limitations Symptoms: Pt states that her pain is much worse today. Pain Assessment Currently in Pain?: Yes Pain Score:   8 Pain Location: Hip Pain Orientation: Left   Exercise/Treatments Lumbar Exercises Scapular Retraction: 5 reps;Standing;Theraband;Limitations Theraband Level (Scapular Retraction): Level 2 (Red) Row: 10 reps;Standing;Theraband Theraband Level (Row): Level 2 (Red) Shoulder Extension: 10 reps;Standing;Theraband Theraband Level (Shoulder Extension): Level 2 (Red) Stability Clam: 10 reps;Side-lying Bridge: 10 reps;Limitations Bridge Limitations: with ab sets Ab Set: 10 reps Hip Abduction: 10 reps;Side-lying Heel Squeeze: 10 reps;5 seconds Leg Raise: 10 reps;Prone Lifting Limitations: POE x 2' to decreast sciatic pain Wall Slides: Limitations Wall Slides Limitations: Glute set x 15  Manual Therapy Manual Therapy: Other (comment) Other Manual Therapy: Sciatic nerve glides LLE x 5'  Physical Therapy Assessment and Plan PT Assessment and Plan Clinical Impression Statement: Pt states that she did not take her anit-inflammatory before therapy because her stomach has been bothering her. Pt has intense pain while in supine. Pain lessened with POE. L sciatic nerve glides completed to decrease pain. Pt reports pain decrease to 5/10 at end of session. Pt states she will ice lower back at home. PT Plan: Continue to progress per PT POC.     Problem List Patient Active Problem List  Diagnoses  . HIP PAIN  . KNEE PAIN  . DEGEN LUMBAR/LUMBOSACRAL INTERVERTEBRAL DISC  . SPINAL STENOSIS  . SCIATICA  . ANSERINE BURSITIS, RIGHT  . Chronic constipation  . GERD (gastroesophageal reflux disease)  .  Weakness of right leg  . OA (osteoarthritis)  . Stiffness of cervical spine    PT - End of Session Activity Tolerance: Patient tolerated treatment well General Behavior During Session: [facility] for tasks performed Cognition: [facility] for tasks performed   Seth Bake, PTA 04/10/2011, 4:13 PM

## 2011-04-11 ENCOUNTER — Encounter (HOSPITAL_COMMUNITY): Payer: Self-pay

## 2011-04-11 ENCOUNTER — Other Ambulatory Visit: Payer: Self-pay

## 2011-04-11 ENCOUNTER — Emergency Department (HOSPITAL_COMMUNITY)
Admission: EM | Admit: 2011-04-11 | Discharge: 2011-04-12 | Disposition: A | Discharge status: Home / Self Care | Payer: Medicare Other | Attending: Emergency Medicine | Admitting: Emergency Medicine

## 2011-04-11 ENCOUNTER — Emergency Department (HOSPITAL_COMMUNITY): Payer: Medicare Other

## 2011-04-11 DIAGNOSIS — Z7982 Long term (current) use of aspirin: Secondary | ICD-10-CM | POA: Insufficient documentation

## 2011-04-11 DIAGNOSIS — R51 Headache: Secondary | ICD-10-CM | POA: Insufficient documentation

## 2011-04-11 DIAGNOSIS — Z79899 Other long term (current) drug therapy: Secondary | ICD-10-CM | POA: Insufficient documentation

## 2011-04-11 DIAGNOSIS — Z862 Personal history of diseases of the blood and blood-forming organs and certain disorders involving the immune mechanism: Secondary | ICD-10-CM | POA: Insufficient documentation

## 2011-04-11 DIAGNOSIS — R630 Anorexia: Secondary | ICD-10-CM | POA: Insufficient documentation

## 2011-04-11 DIAGNOSIS — R350 Frequency of micturition: Secondary | ICD-10-CM | POA: Insufficient documentation

## 2011-04-11 DIAGNOSIS — R079 Chest pain, unspecified: Secondary | ICD-10-CM | POA: Insufficient documentation

## 2011-04-11 DIAGNOSIS — R110 Nausea: Secondary | ICD-10-CM | POA: Insufficient documentation

## 2011-04-11 DIAGNOSIS — K219 Gastro-esophageal reflux disease without esophagitis: Secondary | ICD-10-CM | POA: Insufficient documentation

## 2011-04-11 DIAGNOSIS — R197 Diarrhea, unspecified: Secondary | ICD-10-CM | POA: Insufficient documentation

## 2011-04-11 DIAGNOSIS — IMO0001 Reserved for inherently not codable concepts without codable children: Secondary | ICD-10-CM | POA: Insufficient documentation

## 2011-04-11 DIAGNOSIS — Z8639 Personal history of other endocrine, nutritional and metabolic disease: Secondary | ICD-10-CM | POA: Insufficient documentation

## 2011-04-11 DIAGNOSIS — R109 Unspecified abdominal pain: Secondary | ICD-10-CM | POA: Insufficient documentation

## 2011-04-11 DIAGNOSIS — I4891 Unspecified atrial fibrillation: Secondary | ICD-10-CM | POA: Insufficient documentation

## 2011-04-11 LAB — URINALYSIS, ROUTINE W REFLEX MICROSCOPIC
Bilirubin Urine: NEGATIVE
Glucose, UA: NEGATIVE mg/dL
Hgb urine dipstick: NEGATIVE
Ketones, ur: NEGATIVE mg/dL
Leukocytes, UA: NEGATIVE
Nitrite: NEGATIVE
Protein, ur: NEGATIVE mg/dL
Specific Gravity, Urine: <1.005 — ABNORMAL LOW (ref 1.005–1.030)
Urobilinogen, UA: 0.2 mg/dL (ref 0.0–1.0)
pH: 6 (ref 5.0–8.0)

## 2011-04-11 LAB — HEPATIC FUNCTION PANEL
ALT: 18 U/L (ref 0–35)
AST: 22 U/L (ref 0–37)
Albumin: 4.3 g/dL (ref 3.5–5.2)
Alkaline Phosphatase: 67 U/L (ref 39–117)
Bilirubin, Direct: 0.2 mg/dL (ref 0.0–0.3)
Indirect Bilirubin: 0.7 mg/dL (ref 0.3–0.9)
Total Bilirubin: 0.9 mg/dL (ref 0.3–1.2)
Total Protein: 7.5 g/dL (ref 6.0–8.3)

## 2011-04-11 LAB — DIFFERENTIAL
Basophils Absolute: 0 10*3/uL (ref 0.0–0.1)
Basophils Relative: 0 % (ref 0–1)
Eosinophils Absolute: 0.2 10*3/uL (ref 0.0–0.7)
Eosinophils Relative: 3 % (ref 0–5)
Lymphocytes Relative: 26 % (ref 12–46)
Lymphs Abs: 2.4 10*3/uL (ref 0.7–4.0)
Monocytes Absolute: 0.8 10*3/uL (ref 0.1–1.0)
Monocytes Relative: 9 % (ref 3–12)
Neutro Abs: 5.6 10*3/uL (ref 1.7–7.7)
Neutrophils Relative %: 62 % (ref 43–77)

## 2011-04-11 LAB — POCT I-STAT TROPONIN I: Troponin i, poc: 0 ng/mL (ref 0.00–0.08)

## 2011-04-11 LAB — BASIC METABOLIC PANEL
BUN: 12 mg/dL (ref 6–23)
CO2: 27 mEq/L (ref 19–32)
Calcium: 10.2 mg/dL (ref 8.4–10.5)
Chloride: 96 mEq/L (ref 96–112)
Creatinine, Ser: 0.71 mg/dL (ref 0.50–1.10)
GFR calc Af Amer: >90 mL/min (ref >90)
GFR calc non Af Amer: 84 mL/min — ABNORMAL LOW (ref >90)
Glucose, Bld: 88 mg/dL (ref 70–99)
Potassium: 3.7 mEq/L (ref 3.5–5.1)
Sodium: 134 mEq/L — ABNORMAL LOW (ref 135–145)

## 2011-04-11 LAB — CBC
HCT: 38.3 % (ref 36.0–46.0)
Hemoglobin: 13.5 g/dL (ref 12.0–15.0)
MCH: 32.6 pg (ref 26.0–34.0)
MCHC: 35.2 g/dL (ref 30.0–36.0)
MCV: 92.5 fL (ref 78.0–100.0)
Platelets: 170 10*3/uL (ref 150–400)
RBC: 4.14 MIL/uL (ref 3.87–5.11)
RDW: 13.6 % (ref 11.5–15.5)
WBC: 8.9 10*3/uL (ref 4.0–10.5)

## 2011-04-11 LAB — LIPASE, BLOOD: Lipase: 29 U/L (ref 11–59)

## 2011-04-11 MED ORDER — SODIUM CHLORIDE 0.9 % IV SOLN
INTRAVENOUS | Status: DC
  Administered 2011-04-11: 1000 mL via INTRAVENOUS

## 2011-04-11 MED ORDER — HYDROMORPHONE HCL PF 1 MG/ML IJ SOLN
1.0000 mg | Freq: Once | INTRAMUSCULAR | Status: AC
  Administered 2011-04-11: 1 mg via INTRAVENOUS
  Filled 2011-04-11: qty 1

## 2011-04-11 MED ORDER — ONDANSETRON HCL 4 MG/2ML IJ SOLN
4.0000 mg | Freq: Once | INTRAMUSCULAR | Status: AC
  Administered 2011-04-11: 4 mg via INTRAVENOUS
  Filled 2011-04-11: qty 2

## 2011-04-11 MED ORDER — ALPRAZOLAM 0.5 MG PO TABS
0.5000 mg | ORAL_TABLET | Freq: Once | ORAL | Status: AC
  Administered 2011-04-11: 0.5 mg via ORAL
  Filled 2011-04-11: qty 1

## 2011-04-11 MED ORDER — IOHEXOL 300 MG/ML  SOLN
100.0000 mL | Freq: Once | INTRAMUSCULAR | Status: AC | PRN
  Administered 2011-04-11: 100 mL via INTRAVENOUS

## 2011-04-11 NOTE — ED Notes (Signed)
RN Ron at bedside

## 2011-04-11 NOTE — ED Notes (Signed)
Pt requesting something for her nerves. EDP notified.

## 2011-04-11 NOTE — ED Notes (Signed)
Epigaastric and ribcage pain x 4 days worse tonight, states is "tightness squeezing my ribcage together"  Also with nausea and headache.

## 2011-04-11 NOTE — ED Notes (Signed)
Pt states she took 3 81mg  asa prior to coming to the ed.

## 2011-04-11 NOTE — ED Provider Notes (Signed)
History   This chart was scribed for Carleene Cooper III, MD by Charolett Bumpers . The patient was seen in room APA15/APA15 and the patient's care was started at 9:45pm.  CSN: [id number]  Arrival date & time 04/11/11  2046   First MD Initiated Contac.t with Patient 04/11/11 2143      Chief Complaint  Patient presents with  . Chest Pain  . Abdominal Pain    (Consider location/radiation/quality/duration/timing/severity/associated sxs/prior treatment) HPI Zoe [name] is a 76 y.o. female who presents to the Emergency Department complaining of intermittent, moderate epigastric and LLQ abdominal pain for the past 4 days. Patient reports that the pain has worsened tonight. Patient describes the pain as a pressure and burning pain. Patient states that the pain radiates to her left side and back. Patient also reports associated headache, nausea, diarrhea, leg pain, and decreased appetite. Patient also reports increased urination. Patient reports taking Phenergan, but with no relief. Patient denies having any prior episodes with similar symptoms. Patient reports a h/o spinal, knee, and ankle pain in which she is currently being treated for. Patient also reports a hx of a-fib, gout, and GERD.   PCP: Dr. Juanetta Gosling  Past Medical History  Diagnosis Date  . GERD (gastroesophageal reflux disease)   . Chronic constipation   . Cough   . Nausea   . Hemorrhoids     Past Surgical History  Procedure Date  . Upper gastrointestinal endoscopy 08/03/2009    NUR  . Upper gastrointestinal endoscopy 03/29/2003    EGD ED TCS  . Colonoscopy 03/29/03  . Umbilical hernia repair 04/26/03    JENKINS    Family History  Problem Relation Age of Onset  . Healthy Son   . Healthy Son   . Healthy Daughter     History  Substance Use Topics  . Smoking status: Current Some Day Smoker    Types: Cigarettes  . Smokeless tobacco: Not on file   Comment: Patient states that she may smoke when others smoke,  this is seldom  . Alcohol Use: Yes     social    OB History    Grav Para Term Preterm Abortions TAB SAB Ect Mult Living                  Review of Systems  Constitutional: Positive for appetite change. Negative for fever and chills.  Cardiovascular: Positive for chest pain.  Gastrointestinal: Positive for nausea, abdominal pain and diarrhea. Negative for vomiting.  Genitourinary: Positive for frequency.  Musculoskeletal:       Leg pain.   Neurological: Positive for headaches.  All other systems reviewed and are negative.    Allergies  Codeine  Home Medications   Current Outpatient Rx  Name Route Sig Dispense Refill  . ALPRAZOLAM 0.5 MG PO TABS Oral Take 0.5 mg by mouth. Takes 2-3 per week    . AMLODIPINE BESYLATE 2.5 MG PO TABS Oral Take 2.5 mg by mouth daily.      Marland Kitchen VITAMIN C 100 MG PO TABS Oral Take 500 mg by mouth daily.      . ASPIRIN 81 MG PO TABS Oral Take 81 mg by mouth daily.      Marland Kitchen BIOTIN 10 MG PO TABS Oral Take 5,000 mg by mouth.     Marland Kitchen VITAMIN D 1000 UNITS PO TABS Oral Take 1,000 Units by mouth 3 (three) times daily.      . B-12 1000 MCG PO CAPS Oral Take by mouth.      Marland Kitchen  DIGOXIN 0.25 MG PO TABS Oral Take 250 mcg by mouth daily.      Marland Kitchen DOXEPIN HCL 100 MG PO CAPS Oral Take 100 mg by mouth at bedtime.      . FEBUXOSTAT 40 MG PO TABS Oral Take 80 mg by mouth daily.      Marland Kitchen FEXOFENADINE HCL 60 MG PO TABS Oral Take 60 mg by mouth daily. Patient takes 15 mg daily    . OMEGA-3 FATTY ACIDS 1000 MG PO CAPS Oral Take 1 g by mouth daily.      Marland Kitchen GABAPENTIN 300 MG PO CAPS Oral Take 300 mg by mouth 2 (two) times daily.     . MULTIVITAMINS PO TABS Oral Take 1 tablet by mouth daily.      Marland Kitchen NAPROXEN 500 MG PO TABS Oral Take 500 mg by mouth 2 (two) times daily with a meal.      . OMEPRAZOLE MAGNESIUM 20 MG PO TBEC Oral Take 20 mg by mouth daily.      Marland Kitchen PRAVASTATIN SODIUM 20 MG PO TABS Oral Take 20 mg by mouth daily.      Marland Kitchen PREDNISONE 5 MG PO TABS Oral Take 5 mg by mouth daily.       . QUININE SULFATE 324 MG PO CAPS Oral Take by mouth Nightly.     . SENNOSIDES 8.6 MG PO TABS Oral Take 10 tablets by mouth daily.     Marland Kitchen ZOLPIDEM TARTRATE ER 12.5 MG PO TBCR Oral Take by mouth at bedtime as needed.        BP 166/71  Pulse 61  Temp(Src) 97.8 F (36.6 C) (Oral)  Resp 18  Ht 5\' 7"  (1.702 m)  Wt 170 lb (77.111 kg)  BMI 26.63 kg/m2  SpO2 100%  Physical Exam  Nursing note and vitals reviewed. Constitutional: She is oriented to person, place, and time. She appears well-developed and well-nourished. No distress.  HENT:  Head: Normocephalic and atraumatic.  Right Ear: External ear normal.  Left Ear: External ear normal.  Nose: Nose normal.  Mouth/Throat: Oropharynx is clear and moist.  Eyes: EOM are normal. Pupils are equal, round, and reactive to light.  Neck: Normal range of motion. Neck supple. No tracheal deviation present.  Cardiovascular: Normal rate, regular rhythm and normal heart sounds.  Exam reveals no gallop and no friction rub.   No murmur heard. Pulmonary/Chest: Effort normal and breath sounds normal. No respiratory distress. She has no wheezes. She has no rales.  Abdominal: Soft. Bowel sounds are normal. She exhibits no distension. There is tenderness (Epigastric and LLQ tenderness. ).  Musculoskeletal: Normal range of motion. She exhibits no edema.  Neurological: She is alert and oriented to person, place, and time. No sensory deficit.  Skin: Skin is warm and dry.  Psychiatric: She has a normal mood and affect. Her behavior is normal.    ED Course  Procedures (including critical care time)  DIAGNOSTIC STUDIES: Oxygen Saturation is 100% on room air, normal by my interpretation.    COORDINATION OF CARE:  2157: Discussed planned course of treatment with the patient, patient was agreeable at this time.  2200: Medication Orders: 0.9% sodium chloride infusion-continuous; Ondansetron injection 4 mg-once; Hydromorphone injection 1 mg-once.  Results  for orders placed during the hospital encounter of 04/11/11  URINALYSIS, ROUTINE W REFLEX MICROSCOPIC      Component Value Range   Color, Urine STRAW (*) YELLOW    APPearance CLEAR  CLEAR    Specific Gravity, Urine <1.005 (*)  1.005 - 1.030    pH 6.0  5.0 - 8.0    Glucose, UA NEGATIVE  NEGATIVE (mg/dL)   Hgb urine dipstick NEGATIVE  NEGATIVE    Bilirubin Urine NEGATIVE  NEGATIVE    Ketones, ur NEGATIVE  NEGATIVE (mg/dL)   Protein, ur NEGATIVE  NEGATIVE (mg/dL)   Urobilinogen, UA 0.2  0.0 - 1.0 (mg/dL)   Nitrite NEGATIVE  NEGATIVE    Leukocytes, UA NEGATIVE  NEGATIVE   CBC      Component Value Range   WBC 8.9  4.0 - 10.5 (K/uL)   RBC 4.14  3.87 - 5.11 (MIL/uL)   Hemoglobin 13.5  12.0 - 15.0 (g/dL)   HCT 16.1  09.6 - 04.5 (%)   MCV 92.5  78.0 - 100.0 (fL)   MCH 32.6  26.0 - 34.0 (pg)   MCHC 35.2  30.0 - 36.0 (g/dL)   RDW 40.9  81.1 - 91.4 (%)   Platelets 170  150 - 400 (K/uL)  DIFFERENTIAL      Component Value Range   Neutrophils Relative 62  43 - 77 (%)   Neutro Abs 5.6  1.7 - 7.7 (K/uL)   Lymphocytes Relative 26  12 - 46 (%)   Lymphs Abs 2.4  0.7 - 4.0 (K/uL)   Monocytes Relative 9  3 - 12 (%)   Monocytes Absolute 0.8  0.1 - 1.0 (K/uL)   Eosinophils Relative 3  0 - 5 (%)   Eosinophils Absolute 0.2  0.0 - 0.7 (K/uL)   Basophils Relative 0  0 - 1 (%)   Basophils Absolute 0.0  0.0 - 0.1 (K/uL)  POCT I-STAT TROPONIN I      Component Value Range   Troponin i, poc 0.00  0.00 - 0.08 (ng/mL)   Comment 3             Dg Chest Portable 1 View  04/11/2011  *RADIOLOGY REPORT*  Clinical Data: Chest pain and abdominal pain; history of atrial fibrillation.  PORTABLE CHEST - 1 VIEW  Comparison: None.  Findings: The lungs are well-aerated.  There is no evidence of focal opacification, pleural effusion or pneumothorax.  A calcified granuloma is noted at the left midlung zone.  The lung apices are incompletely imaged on this study.  The cardiomediastinal silhouette is within normal limits.   No acute osseous abnormalities are seen.  IMPRESSION: No acute cardiopulmonary process seen.  Original Report Authenticated By: Tonia Ghent, M.D.   12:01 AM I reviewed patient's lab results and CT x-ray result with her. Essentially all of her tests were normal. Advised her to take her omeprazole twice a day, and take antacid liquid after meals and at bedtime. She can take hydrocodone acetaminophen every 4 hours if needed for pain. She should follow this up with Kari Baars M.D., her primary care doctor. Advised her that if her pain worsens she could come back at any time for reevaluation.  1. Abdominal  pain, other specified site      I personally performed the services described in this documentation, which was scribed in my presence. The recorded information has been reviewed and considered.  Osvaldo Human, M.D.     Carleene Cooper III, MD 04/12/11 [phone]

## 2011-04-11 NOTE — ED Notes (Signed)
Family at bedside. Patient states she feels better then she did earlier. Patient states she needs something for anixty. RN Ron notified.

## 2011-04-12 MED ORDER — HYDROCODONE-ACETAMINOPHEN 5-325 MG PO TABS: 1.0000 | ORAL_TABLET | ORAL | Status: AC | PRN

## 2011-04-12 NOTE — ED Notes (Signed)
Pt alert & oriented x4. Pt given discharge instructions, paperwork & prescription(s). Patient instructed to stop at the registration desk to finish any additional paperwork. pt verbalized understanding. Pt left department w/ no further questions.  

## 2011-04-12 NOTE — Discharge Instructions (Signed)
Mrs. Fantini, you had physical examination, electrocardiogram, laboratory testing, and CT x-ray of the abdomen to check on you for upper abdominal pain in left lower abdominal pain. Fortunately, your tests including this CT x-ray were all normal. It is safe to go home. You should take your omeprazole twice a day, and should take antacid liquid after meals and at bedtime.  You can take hydrocodone-acetaminophen every 4 hours if needed for pain; this medicine woould not upset your stomach.  Return to the [facility] ED if you have worsening pain, develop a fever, or have other symptoms that are causing concern.

## 2011-04-13 LAB — URINE CULTURE
Colony Count: NO GROWTH
Culture  Setup Time: 201303162006
Culture: NO GROWTH

## 2011-04-14 ENCOUNTER — Ambulatory Visit (HOSPITAL_COMMUNITY): Payer: Medicare Other | Admitting: *Deleted

## 2011-04-15 ENCOUNTER — Ambulatory Visit (HOSPITAL_COMMUNITY): Payer: Medicare Other | Admitting: *Deleted

## 2011-04-16 ENCOUNTER — Encounter (INDEPENDENT_AMBULATORY_CARE_PROVIDER_SITE_OTHER): Payer: Self-pay | Admitting: Internal Medicine

## 2011-04-16 ENCOUNTER — Ambulatory Visit (INDEPENDENT_AMBULATORY_CARE_PROVIDER_SITE_OTHER): Payer: Medicare Other | Admitting: Internal Medicine

## 2011-04-16 VITALS — BP 148/72 | HR 76 | Temp 97.6°F | Ht 67.0 in | Wt 172.4 lb

## 2011-04-16 DIAGNOSIS — K219 Gastro-esophageal reflux disease without esophagitis: Secondary | ICD-10-CM

## 2011-04-16 MED ORDER — SUCRALFATE 1 GM/10ML PO SUSP: 1.0000 g | Freq: Four times a day (QID) | ORAL | Status: DC

## 2011-04-16 NOTE — Progress Notes (Signed)
Subjective:     Patient ID: Zoe [name], female   DOB: [birth year], 76 y.o.   MRN: [id number]  HPI Seen in the ED Friday night for epigastric pain and left lower quadrant pain. She had chills, but no fever. There was nausea associated with her symptoms.  Her symptoms started the Monday before she went to the ED. She underwent a CT which did not reveal anything acute. Today she says she still has epigastric pain. She rates the pain 5/10. She continues to have nausea. No chills. Nothing taste very good. No vomiting.  She does tell me she did have diarrhea.  No melena or bright red rectal bleeding. She says she has lost 7 pounds since Monday. She was last seen in our office in January of 2012 for f/u of GERD and constipation. Her last  EGD/ED and colonoscopy was in March of 2005 by Dr. Rehman:Small sliding hiatal hernia. Nonerosive antral gastritis. Esophagus dilated by passing 56 french Maloney dilator given history of dysphagia. Melanosis coli, otherwise normal redundant colon  04/11/2011 CT abdomen and Pelvis with CM: IMPRESSION:  1. No acute findings, mass, or lymphadenopathy in the abdomen  pelvis.  2. Moderate bilateral hip posterior right base.  3. Multilevel degenerative disc disease.  4. Aorto-iliac atherosclerosis.  Original Report Authenticated By: Britta Mccreedy, M.D.       BMET    Component Value Date/Time   NA 134* 04/11/2011 2119   K 3.7 04/11/2011 2119   CL 96 04/11/2011 2119   CO2 27 04/11/2011 2119   GLUCOSE 88 04/11/2011 2119   BUN 12 04/11/2011 2119   CREATININE 0.71 04/11/2011 2119   CALCIUM 10.2 04/11/2011 2119   GFRNONAA 84* 04/11/2011 2119   GFRAA >90 04/11/2011 2119    CBC    Component Value Date/Time   WBC 8.9 04/11/2011 2119   RBC 4.14 04/11/2011 2119   HGB 13.5 04/11/2011 2119   HCT 38.3 04/11/2011 2119   PLT 170 04/11/2011 2119   MCV 92.5 04/11/2011 2119   MCH 32.6 04/11/2011 2119   MCHC 35.2 04/11/2011 2119   RDW 13.6 04/11/2011 2119   LYMPHSABS 2.4 04/11/2011  2119   MONOABS 0.8 04/11/2011 2119   EOSABS 0.2 04/11/2011 2119   BASOSABS 0.0 04/11/2011 2119    Urinalysis    Component Value Date/Time   COLORURINE STRAW* 04/11/2011 2118   APPEARANCEUR CLEAR 04/11/2011 2118   LABSPEC <1.005* 04/11/2011 2118   PHURINE 6.0 04/11/2011 2118   GLUCOSEU NEGATIVE 04/11/2011 2118   HGBUR NEGATIVE 04/11/2011 2118   BILIRUBINUR NEGATIVE 04/11/2011 2118   KETONESUR NEGATIVE 04/11/2011 2118   PROTEINUR NEGATIVE 04/11/2011 2118   UROBILINOGEN 0.2 04/11/2011 2118   NITRITE NEGATIVE 04/11/2011 2118   LEUKOCYTESUR NEGATIVE 04/11/2011 2118   Lipase     Component Value Date/Time   LIPASE 29 04/11/2011 2200          Review of Systemssee hpi Current Outpatient Prescriptions  Medication Sig Dispense Refill  . ALPRAZolam (XANAX) 0.5 MG tablet Take 0.5 mg by mouth 3 (three) times a week. As needed for anxiety      . amLODipine (NORVASC) 2.5 MG tablet Take 2.5 mg by mouth every morning.       Marland Kitchen aspirin EC 81 MG tablet Take 81 mg by mouth every morning.      . Biotin (BIOTIN 5000) 5 MG CAPS Take 1 capsule by mouth every morning.      . cholecalciferol (VITAMIN D) 1000 UNITS tablet Take 1,000 Units  by mouth daily.       . Cyanocobalamin (B-12) 1000 MCG CAPS Take 1 capsule by mouth every morning.       . digoxin (LANOXIN) 0.25 MG tablet Take 250 mcg by mouth at bedtime.       Marland Kitchen doxepin (SINEQUAN) 100 MG capsule Take 100 mg by mouth at bedtime.        Marland Kitchen etodolac (LODINE) 400 MG tablet Take 400 mg by mouth daily.      . febuxostat (ULORIC) 40 MG tablet Take 20 mg by mouth daily.       . fexofenadine (ALLEGRA) 60 MG tablet Take 15 mg by mouth daily. **Table is cut into quarter amounts**      . fish oil-omega-3 fatty acids 1000 MG capsule Take 1 g by mouth daily.        Marland Kitchen gabapentin (NEURONTIN) 300 MG capsule Take 300 mg by mouth daily.       Marland Kitchen omeprazole (PRILOSEC OTC) 20 MG tablet Take 20 mg by mouth at bedtime.       . pravastatin (PRAVACHOL) 20 MG tablet Take 10 mg by  mouth every morning.       . quiNINE (QUALAQUIN) 324 MG capsule Take by mouth Nightly.       . senna (SENOKOT) 8.6 MG tablet Take 10 tablets by mouth daily.       . vitamin C (ASCORBIC ACID) 500 MG tablet Take 1,000 mg by mouth daily.      Marland Kitchen HYDROcodone-acetaminophen (NORCO) 5-325 MG per tablet Take 1 tablet by mouth every 4 (four) hours as needed for pain.  20 tablet  0  . Multiple Vitamin (MULITIVITAMIN WITH MINERALS) TABS Take 1 tablet by mouth daily.      . naproxen (NAPROSYN) 500 MG tablet Take 500 mg by mouth 2 (two) times daily with a meal.        . predniSONE (DELTASONE) 5 MG tablet Take 5 mg by mouth 3 (three) times a week.       . zolpidem (AMBIEN CR) 12.5 MG CR tablet Take 12.5 mg by mouth at bedtime as needed. For sleep       Past Medical History  Diagnosis Date  . GERD (gastroesophageal reflux disease)   . Chronic constipation   . Cough   . Nausea   . Hemorrhoids    Past Surgical History  Procedure Date  . Upper gastrointestinal endoscopy 08/03/2009    NUR  . Upper gastrointestinal endoscopy 03/29/2003    EGD ED TCS  . Colonoscopy 03/29/03  . Umbilical hernia repair 04/26/03    JENKINS   History   Social History  . Marital Status: Married    Spouse Name: N/A    Number of Children: N/A  . Years of Education: N/A   Occupational History  . Not on file.   Social History Main Topics  . Smoking status: Current Some Day Smoker    Types: Cigarettes  . Smokeless tobacco: Not on file   Comment: Patient states that she may smoke when others smoke, this is seldom  . Alcohol Use: No     social  . Drug Use: No  . Sexually Active: Not on file   Other Topics Concern  . Not on file   Social History Narrative  . No narrative on file   Family Status  Relation Status Death Age  . Mother Deceased 94    drug addiction  . Father Deceased 80    stroke  .  Son Alive   . Son Alive   . Daughter Alive    Allergies  Allergen Reactions  . Codeine Other (See Comments)      Pains in abdominal area       Objective:   Physical Exam Filed Vitals:   04/16/11 1419  Height: 5\' 7"  (1.702 m)  Weight: 172 lb 6.4 oz (78.2 kg)  Alert and oriented. Skin warm and dry. Oral mucosa is moist.   . Sclera anicteric, conjunctivae is pink. Thyroid not enlarged. No cervical lymphadenopathy. Lungs clear. Heart regular rate and rhythm.  Abdomen is soft. Bowel sounds are positive. No hepatomegaly. No abdominal masses felt. No tenderness.  No edema to lower extremities. Patient is alert and oriented.       Assessment:    Probable gastritis given hx of NSAID use. However her CBC is normal.    Plan:    Would like for her to stay on a bland diet. Carafate 1 gm po 30 minutes before each meal. Stop Lodine.   PR in 1 week

## 2011-04-16 NOTE — Patient Instructions (Addendum)
PR in 1 week. No NSAIDs. Cut ASA to 81mg . Carafate 1 gm QID

## 2011-04-17 ENCOUNTER — Ambulatory Visit (HOSPITAL_COMMUNITY): Payer: Medicare Other | Admitting: *Deleted

## 2011-04-17 ENCOUNTER — Ambulatory Visit (HOSPITAL_COMMUNITY)
Admission: RE | Admit: 2011-04-17 | Discharge: 2011-04-17 | Disposition: A | Discharge status: Home / Self Care | Payer: Medicare Other | Source: Ambulatory Visit | Attending: Pulmonary Disease | Admitting: Pulmonary Disease

## 2011-04-17 NOTE — Progress Notes (Signed)
Physical Therapy Treatment Patient Details  Name: Zoe [name] MRN: [id number] Date of Birth: 04-07-1935  Today's Date: 04/17/2011 Time: 7829-5621 Time Calculation (min): 47 min Visit#: 6  of 8   Re-eval: 04/23/11 Charges: Therex x 24' MHP x 20'  Subjective: Symptoms/Limitations Symptoms: Pt reports that she has been very sick on her stomach. She requests to only work on her neck. Pain Assessment Currently in Pain?: Yes Pain Score:   7 Pain Location: Neck Pain Orientation: Mid;Posterior   Exercise/Treatments  Stretches Upper Trapezius Stretch: 3 reps;30 seconds Neck Exercises Neck Flexion: 10 reps;AROM Neck Extension: 10 reps;AROM Neck Rotation - Right: 10 reps;AROM;15 reps;Supine;Seated (Completed in supine and seated) Neck Rotation - Left: 10 reps;AROM;Supine;Seated (Completed in supine and seated)  Modalities Modalities: Moist Heat Moist Heat Therapy Number Minutes Moist Heat: 20 Minutes (10' before TE/ 10' after TE) Moist Heat Location: Other (comment) (Cervical and scapular area)  Physical Therapy Assessment and Plan PT Assessment and Plan Clinical Impression Statement: Tx focus on decreasing neck pain. MHP applied to neck before and after stretches and ROM. All strengthening exercises held secondary to pain. Pt is extremely stiff and guarded. Pt reports pain decrease to 4/10 at end of session PT Plan: Continue to progress as pain allows.     Problem List Patient Active Problem List  Diagnoses  . HIP PAIN  . KNEE PAIN  . DEGEN LUMBAR/LUMBOSACRAL INTERVERTEBRAL DISC  . SPINAL STENOSIS  . SCIATICA  . ANSERINE BURSITIS, RIGHT  . Chronic constipation  . GERD (gastroesophageal reflux disease)  . Weakness of right leg  . OA (osteoarthritis)  . Stiffness of cervical spine    PT - End of Session Activity Tolerance: Patient limited by pain General Behavior During Session: [facility] for tasks performed Cognition: [facility] for tasks performed   Seth Bake,  PTA 04/17/2011, 4:18 PM

## 2011-04-17 NOTE — Progress Notes (Signed)
Pt requests to hold hip and low back tx this session secondary to nausea. Pt would like to only work on neck because she is having a lot of pain in it today.  Seth Bake, PTA

## 2011-04-22 ENCOUNTER — Ambulatory Visit (HOSPITAL_COMMUNITY): Payer: Medicare Other | Admitting: *Deleted

## 2011-04-24 ENCOUNTER — Ambulatory Visit (HOSPITAL_COMMUNITY): Payer: Medicare Other | Admitting: Physical Therapy

## 2011-04-29 ENCOUNTER — Ambulatory Visit (HOSPITAL_COMMUNITY)
Admission: RE | Admit: 2011-04-29 | Discharge: 2011-04-29 | Disposition: A | Discharge status: Home / Self Care | Payer: Medicare Other | Source: Ambulatory Visit | Attending: Rehabilitation | Admitting: Rehabilitation

## 2011-04-29 DIAGNOSIS — M25519 Pain in unspecified shoulder: Secondary | ICD-10-CM | POA: Insufficient documentation

## 2011-04-29 DIAGNOSIS — IMO0001 Reserved for inherently not codable concepts without codable children: Secondary | ICD-10-CM | POA: Insufficient documentation

## 2011-04-29 DIAGNOSIS — M6281 Muscle weakness (generalized): Secondary | ICD-10-CM | POA: Insufficient documentation

## 2011-04-29 DIAGNOSIS — M25619 Stiffness of unspecified shoulder, not elsewhere classified: Secondary | ICD-10-CM | POA: Insufficient documentation

## 2011-04-29 DIAGNOSIS — M542 Cervicalgia: Secondary | ICD-10-CM | POA: Insufficient documentation

## 2011-04-29 NOTE — Progress Notes (Addendum)
Physical Therapy Treatment Patient Details  Name: Zoe [name] MRN: [id number] Date of Birth: [birth year]  Today's Date: 04/29/2011 Time: 7829-5621 Time Calculation (min): 15 min Visit#: 7  of 16   Re-eval: 05/27/11 Charges: MMT x 1 ROMM x 1  Subjective: Symptoms/Limitations Symptoms: Pt states that she has noticed a decrease in her pain. Pain Assessment Currently in Pain?: Yes Pain Score:   3 Pain Location: Neck Pain Orientation: Left  Objective:  04/29/11 1454  RLE Strength  Right Hip Flexion 4/5  Right Hip Extension 4/5  Right Hip ABduction 4/5  Right Knee Flexion 5/5  Right Knee Extension 5/5  Right Ankle Dorsiflexion 5/5  LLE Strength  Left Hip Flexion 4/5  Left Hip Extension 4/5  Left Hip ABduction 5/5  Left Knee Flexion 5/5  Left Knee Extension 5/5  Left Ankle Dorsiflexion 5/5  Cervical AROM  Cervical Flexion WNL with minimal pain  Cervical Extension WNL with moderate pain  Cervical - Right Side Bend WNL w/o pain  Cervical - Left Side Bend WNL with slight increase in pain  Cervical - Right Rotation WNL  Cervical - Left Rotation Decreased 10% with slight increase in pain  Cervical Strength  Cervical Extension 4/5  Cervical - Right Side Bend 4/5  Cervical - Left Side Bend 4/5  Lumbar AROM  Lumbar Flexion WFL without pain  Lumbar Extension Decreased 20% with increase back/neck pain  Lumbar - Right Side Bend WNL  Lumbar - Left Side Bend WNL  Lumbar - Right Rotation WNL  Lumbar - Left Rotation WNL    Physical Therapy Assessment and Plan PT Assessment and Plan Clinical Impression Statement: Pt displays improved ROM and decreased pain. Pt presents with improved posture. Pt wound benefit from continuing therapy to continue to decrease pain and increase strength and motion to improve quality of life. PT Plan: Recommend to PT to conintue x 4 more weeks.    Goals Home Exercise Program Pt will Perform Home Exercise Program: Independently PT Short Term  Goals Time to Complete Short Term Goals: 2 weeks PT Short Term Goal 1: Pt to state H/A are 2-3 x a week PT Short Term Goal 1 - Progress: Progressing toward goal (Pt reports she has about 4 headaches a week) PT Short Term Goal 2: Pt to state that her pain is at the most a 4/10 PT Short Term Goal 2 - Progress: Progressing toward goal (Pain appears to be decreasing but pt states worst pain at 8 ) PT Short Term Goal 3: Pt to have full rotation PT Short Term Goal 3 - Progress: Progressing toward goal PT Long Term Goals Time to Complete Long Term Goals: 4 weeks PT Long Term Goal 1: I in advance HEP PT Long Term Goal 1 - Progress: Partly met PT Long Term Goal 2: Pt to have no cervical pain PT Long Term Goal 2 - Progress: Progressing toward goal (Pain is decreasing) Long Term Goal 3: Pt to have no Headaches for the past 5 days Long Term Goal 3 Progress: Progressing toward goal  Problem List Patient Active Problem List  Diagnoses  . HIP PAIN  . KNEE PAIN  . DEGEN LUMBAR/LUMBOSACRAL INTERVERTEBRAL DISC  . SPINAL STENOSIS  . SCIATICA  . ANSERINE BURSITIS, RIGHT  . Chronic constipation  . GERD (gastroesophageal reflux disease)  . Weakness of right leg  . OA (osteoarthritis)  . Stiffness of cervical spine    PT - End of Session Activity Tolerance: Patient tolerated treatment well General Behavior During  Session: [facility] for tasks performed Cognition: [facility] for tasks performed   Seth Bake, PTA 04/29/2011, 3:10 PM

## 2011-04-29 NOTE — Progress Notes (Addendum)
Physical Therapy Treatment Patient Details  Name: Zoe [name] MRN: [id number] Date of Birth: Jun 28, 1935  Today's Date: 04/29/2011 Time: 8469-6295 Time Calculation (min): 17 min Visit#: 6  of 16   Re-eval: 05/27/11 Charges: MMT x 1  Subjective: Symptoms/Limitations Symptoms: Pt states that she has noticed a large difference in her LE strength. Pain Assessment Pain Score:   4 Pain Location: Hip Pain Orientation: Right  Objective:  04/29/11 1435  RLE Strength  Right Hip Flexion 4/5  Right Hip Extension 4/5  Right Hip ABduction 4/5  Right Knee Flexion 5/5  Right Knee Extension 5/5  Right Ankle Dorsiflexion 5/5  LLE Strength  Left Hip Flexion 4/5  Left Hip Extension 4/5  Left Hip ABduction 5/5  Left Knee Flexion 5/5  Left Knee Extension 5/5  Left Ankle Dorsiflexion 5/5     Physical Therapy Assessment and Plan PT Assessment and Plan Clinical Impression Statement: Pt displays good improvements in LE strength. Pt continues to present with trendelenburg gait. Pt would benefits from continuing therapy to improve functional strength and gait. PT Plan: Recommend to PT to conintue x 4 more weeks.     Goals Home Exercise Program Pt will Perform Home Exercise Program: Independently PT Short Term Goals Time to Complete Short Term Goals: 2 weeks PT Short Term Goal 1: Pt to increase LE mm by 1/2 grade to decrase trendelenburg gait. PT Short Term Goal 1 - Progress: Partly met PT Long Term Goals Time to Complete Long Term Goals: 4 weeks PT Long Term Goal 1: strength to be increased by one grade to normalize gait pattern. PT Long Term Goal 1 - Progress: Partly met PT Long Term Goal 2: Pt to be I in advance HEP. PT Long Term Goal 2 - Progress: Met  Problem List Patient Active Problem List  Diagnoses  . HIP PAIN  . KNEE PAIN  . DEGEN LUMBAR/LUMBOSACRAL INTERVERTEBRAL DISC  . SPINAL STENOSIS  . SCIATICA  . ANSERINE BURSITIS, RIGHT  . Chronic constipation  . GERD  (gastroesophageal reflux disease)  . Weakness of right leg  . OA (osteoarthritis)  . Stiffness of cervical spine    PT - End of Session Activity Tolerance: Patient tolerated treatment well General Behavior During Session: [facility] for tasks performed Cognition: [facility] for tasks performed    Seth Bake, PTA 04/29/2011, 2:49 PM

## 2011-04-30 ENCOUNTER — Ambulatory Visit (HOSPITAL_COMMUNITY): Payer: Medicare Other | Admitting: Physical Therapy

## 2011-05-07 ENCOUNTER — Inpatient Hospital Stay (HOSPITAL_COMMUNITY): Admission: RE | Admit: 2011-05-07 | Payer: Medicare Other | Source: Ambulatory Visit

## 2011-05-11 ENCOUNTER — Emergency Department (HOSPITAL_COMMUNITY)
Admission: EM | Admit: 2011-05-11 | Discharge: 2011-05-11 | Disposition: A | Discharge status: Home / Self Care | Payer: Medicare Other | Attending: Emergency Medicine | Admitting: Emergency Medicine

## 2011-05-11 ENCOUNTER — Emergency Department (HOSPITAL_COMMUNITY): Payer: Medicare Other

## 2011-05-11 ENCOUNTER — Encounter (HOSPITAL_COMMUNITY): Payer: Self-pay | Admitting: Emergency Medicine

## 2011-05-11 DIAGNOSIS — K5909 Other constipation: Secondary | ICD-10-CM | POA: Insufficient documentation

## 2011-05-11 DIAGNOSIS — F17200 Nicotine dependence, unspecified, uncomplicated: Secondary | ICD-10-CM | POA: Insufficient documentation

## 2011-05-11 DIAGNOSIS — K6289 Other specified diseases of anus and rectum: Secondary | ICD-10-CM | POA: Insufficient documentation

## 2011-05-11 DIAGNOSIS — E789 Disorder of lipoprotein metabolism, unspecified: Secondary | ICD-10-CM | POA: Insufficient documentation

## 2011-05-11 DIAGNOSIS — I10 Essential (primary) hypertension: Secondary | ICD-10-CM | POA: Insufficient documentation

## 2011-05-11 DIAGNOSIS — K5641 Fecal impaction: Secondary | ICD-10-CM

## 2011-05-11 DIAGNOSIS — Z79899 Other long term (current) drug therapy: Secondary | ICD-10-CM | POA: Insufficient documentation

## 2011-05-11 DIAGNOSIS — K219 Gastro-esophageal reflux disease without esophagitis: Secondary | ICD-10-CM | POA: Insufficient documentation

## 2011-05-11 HISTORY — DX: Essential (primary) hypertension: I10

## 2011-05-11 HISTORY — DX: Pure hypercholesterolemia, unspecified: E78.00

## 2011-05-11 MED ORDER — POLYETHYLENE GLYCOL 3350 17 GM/SCOOP PO POWD: 17.0000 g | Freq: Every day | ORAL | Status: AC

## 2011-05-11 MED ORDER — HYDROMORPHONE HCL PF 1 MG/ML IJ SOLN
1.0000 mg | Freq: Once | INTRAMUSCULAR | Status: DC
  Filled 2011-05-11: qty 1

## 2011-05-11 MED ORDER — MINERAL OIL RE ENEM
1.0000 | ENEMA | Freq: Once | RECTAL | Status: AC
  Administered 2011-05-11: 1 via RECTAL

## 2011-05-11 MED ORDER — BISACODYL 10 MG RE SUPP
10.0000 mg | Freq: Once | RECTAL | Status: AC
  Administered 2011-05-11: 10 mg via RECTAL
  Filled 2011-05-11: qty 1

## 2011-05-11 MED ORDER — FLEET ENEMA 7-19 GM/118ML RE ENEM: 1.0000 | ENEMA | Freq: Once | RECTAL | Status: DC

## 2011-05-11 MED ORDER — MAGNESIUM CITRATE PO SOLN
1.0000 | Freq: Once | ORAL | Status: AC
  Administered 2011-05-11: 1 via ORAL
  Filled 2011-05-11: qty 296

## 2011-05-11 MED ORDER — FLEET ENEMA 7-19 GM/118ML RE ENEM
1.0000 | ENEMA | Freq: Once | RECTAL | Status: AC
  Administered 2011-05-11: 1 via RECTAL

## 2011-05-11 NOTE — ED Notes (Signed)
Pt c/o constipation/impaction. lnbm x 2 days ago. Denies n/v.. nad noted.

## 2011-05-11 NOTE — ED Notes (Signed)
Family at bedside. Patient is comfortable at this time. 

## 2011-05-11 NOTE — ED Notes (Signed)
Pt has no relief. States there is no much pressure and wants something else. J Idol PA aware. Pt rating pain 9

## 2011-05-11 NOTE — ED Notes (Signed)
Enema given. Bedside commode in room. Nad.

## 2011-05-13 ENCOUNTER — Ambulatory Visit (HOSPITAL_COMMUNITY): Payer: Medicare Other

## 2011-05-14 NOTE — ED Provider Notes (Signed)
History     CSN: [id number]  Arrival date & time 05/11/11  1016   First MD Initiated Contact with Patient 05/11/11 1029      Chief Complaint  Patient presents with  . Fecal Impaction    (Consider location/radiation/quality/duration/timing/severity/associated sxs/prior treatment) HPI Comments: Patient has a history of chronic constipation for which she uses daily senakot.  Her last bm was 2 days ago.  She has been trying to pass a large hard stool this am at home,  But has been unsuccessful,  Despite trying a home enema.  Patient is a 76 y.o. female presenting with constipation. The history is provided by the patient.  Constipation  The current episode started today. The problem occurs occasionally. The problem has been unchanged. The pain is moderate. The stool is described as hard. Prior successful therapies include laxatives. Prior unsuccessful therapies include enemas. Associated symptoms include rectal pain. Pertinent negatives include no fever, no abdominal pain, no nausea, no vomiting, no chest pain, no headaches and no rash. Her past medical history does not include inflammatory bowel disease or recent abdominal injury.    Past Medical History  Diagnosis Date  . GERD (gastroesophageal reflux disease)   . Chronic constipation   . Cough   . Nausea   . Hemorrhoids   . Hypertension   . High cholesterol     Past Surgical History  Procedure Date  . Upper gastrointestinal endoscopy 08/03/2009    NUR  . Upper gastrointestinal endoscopy 03/29/2003    EGD ED TCS  . Colonoscopy 03/29/03  . Umbilical hernia repair 04/26/03    JENKINS  . Cholecystectomy   . Appendectomy     Family History  Problem Relation Age of Onset  . Healthy Son   . Healthy Son   . Healthy Daughter     History  Substance Use Topics  . Smoking status: Current Some Day Smoker    Types: Cigarettes  . Smokeless tobacco: Not on file   Comment: Patient states that she may smoke when others smoke,  this is seldom  . Alcohol Use: No     social    OB History    Grav Para Term Preterm Abortions TAB SAB Ect Mult Living                  Review of Systems  Constitutional: Negative for fever.  HENT: Negative for congestion, sore throat and neck pain.   Eyes: Negative.   Respiratory: Negative for chest tightness and shortness of breath.   Cardiovascular: Negative for chest pain.  Gastrointestinal: Positive for constipation and rectal pain. Negative for nausea, vomiting, abdominal pain and abdominal distention.  Genitourinary: Negative.   Musculoskeletal: Negative for joint swelling and arthralgias.  Skin: Negative.  Negative for rash and wound.  Neurological: Negative for dizziness, weakness, light-headedness, numbness and headaches.  Hematological: Negative.   Psychiatric/Behavioral: Negative.     Allergies  Codeine  Home Medications   Current Outpatient Rx  Name Route Sig Dispense Refill  . ALPRAZOLAM 0.5 MG PO TABS Oral Take 0.5 mg by mouth 4 (four) times daily as needed. As needed for anxiety    . AMLODIPINE BESYLATE 2.5 MG PO TABS Oral Take 2.5 mg by mouth every morning.     . ASPIRIN EC 81 MG PO TBEC Oral Take 81 mg by mouth every morning.    Marland Kitchen BIOTIN 5 MG PO CAPS Oral Take 1 capsule by mouth every morning.    Marland Kitchen VITAMIN D 1000 UNITS  PO TABS Oral Take 1,000 Units by mouth daily.     . B-12 1000 MCG PO CAPS Oral Take 1 capsule by mouth every morning.     Marland Kitchen DIGOXIN 0.25 MG PO TABS Oral Take 250 mcg by mouth at bedtime.     Marland Kitchen DOXEPIN HCL 100 MG PO CAPS Oral Take 100 mg by mouth at bedtime.      . FEBUXOSTAT 40 MG PO TABS Oral Take 20 mg by mouth at bedtime.     Marland Kitchen FEXOFENADINE HCL 60 MG PO TABS Oral Take 15 mg by mouth daily. **Table is cut into quarter amounts**    . OMEGA-3 FATTY ACIDS 1000 MG PO CAPS Oral Take 1 g by mouth daily.      Marland Kitchen GABAPENTIN 300 MG PO CAPS Oral Take 300 mg by mouth 2 (two) times daily.     . ADULT MULTIVITAMIN W/MINERALS CH Oral Take 1 tablet by  mouth daily.    Marland Kitchen NAPROXEN 500 MG PO TABS Oral Take 500 mg by mouth 2 (two) times daily with a meal.      . OMEPRAZOLE MAGNESIUM 20 MG PO TBEC Oral Take 20 mg by mouth at bedtime.     Marland Kitchen PRAVASTATIN SODIUM 20 MG PO TABS Oral Take 10 mg by mouth every morning.     . QUININE SULFATE 324 MG PO CAPS Oral Take by mouth Nightly.     . SENNOSIDES 8.6 MG PO TABS Oral Take 5 tablets by mouth 2 (two) times daily.     Marland Kitchen VITAMIN C 500 MG PO TABS Oral Take 1,000 mg by mouth daily.    Marland Kitchen ZOLPIDEM TARTRATE ER 12.5 MG PO TBCR Oral Take 12.5 mg by mouth at bedtime as needed. For sleep    . POLYETHYLENE GLYCOL 3350 PO POWD Oral Take 17 g by mouth daily. 527 g 0    BP 119/68  Pulse 57  Temp 97.6 F (36.4 C)  Resp 18  Ht 5\' 7"  (1.702 m)  Wt 170 lb (77.111 kg)  BMI 26.63 kg/m2  SpO2 95%  Physical Exam  Nursing note and vitals reviewed. Constitutional: She is oriented to person, place, and time. She appears well-developed and well-nourished.  HENT:  Head: Normocephalic and atraumatic.  Eyes: Conjunctivae are normal.  Neck: Normal range of motion.  Cardiovascular: Normal rate, regular rhythm, normal heart sounds and intact distal pulses.   Pulmonary/Chest: Effort normal and breath sounds normal. She has no wheezes.  Abdominal: Soft. Bowel sounds are normal. She exhibits no distension. There is no tenderness. There is no rebound and no guarding.  Genitourinary: Rectal exam shows tenderness.       Hard,  Large bolus of stool in rectum barely within reach of digital exam.  Musculoskeletal: Normal range of motion.  Neurological: She is alert and oriented to person, place, and time.  Skin: Skin is warm and dry.  Psychiatric: She has a normal mood and affect.    ED Course  Procedures (including critical care time)  Labs Reviewed - No data to display No results found.   1. Fecal impaction in rectum    Patient given dulcolax suppository,  Without results.  Fleet enema with return of large stool.   Patient also given mag citrate,  Then an additional mineral oil enema for complete resolution.  Pt tolerated well.   MDM  Constipation with impaction.  Labs and xrays reviewed.  Patient also seen by Dr. Manus Gunning during pt encounter.  Miralax recommended for daily  use. F/u pcp prn.        Candis Musa, PA 05/14/11 1255

## 2011-05-14 NOTE — ED Provider Notes (Signed)
Medical screening examination/treatment/procedure(s) were conducted as a shared visit with non-physician practitioner(s) and myself.  I personally evaluated the patient during the encounter  Fecal impaction.  Successful defecation after enemas and digital stimulation.  Abdomen soft and nontender.    Glynn Octave, MD 05/14/11 [phone]

## 2011-05-15 ENCOUNTER — Ambulatory Visit (HOSPITAL_COMMUNITY)
Admission: RE | Admit: 2011-05-15 | Discharge: 2011-05-15 | Disposition: A | Discharge status: Home / Self Care | Payer: Medicare Other | Source: Ambulatory Visit | Attending: Rehabilitation | Admitting: Rehabilitation

## 2011-05-15 NOTE — Progress Notes (Signed)
Physical Therapy Treatment Patient Details  Name: Zoe [name] MRN: [id number] Date of Birth: Mar 11, 1935  Today's Date: 05/15/2011 Time: 4098-1191 Time Calculation (min): 33 min Visit#: 8  of 16   Re-eval: 05/27/11 Charges: Manual x 15' MHP x 15'  Subjective: Symptoms/Limitations Symptoms: Pt states that her neck is feeling some better. Pain Assessment Currently in Pain?: Yes Pain Score:   3 Pain Location: Neck Pain Orientation: Left   Exercise/Treatments  Modalities Modalities: Moist Heat Manual Therapy Manual Therapy: Other (comment) Massage: STM to L upper trap to decrease pain and spasm Other Manual Therapy: Sciatic nerve glides LLE Moist Heat Therapy Number Minutes Moist Heat: 15 Minutes Moist Heat Location: Other (comment) (Cervical)  Physical Therapy Assessment and Plan PT Assessment and Plan Clinical Impression Statement: Tx focus on decreasing pain. Sciatic nerve glides completed to LLE. STM completed to L upper trap to decrease pain/tightness. MHP applied to cervical area to decrease tightness. Pt Reports 1/10 pain in neck and 6/10 pain in hip at end of session.  PT Plan: Continue per PT POC. Attempt to increase therex for neck and back pain next session as pain appears to be decreasing.     Problem List Patient Active Problem List  Diagnoses  . HIP PAIN  . KNEE PAIN  . DEGEN LUMBAR/LUMBOSACRAL INTERVERTEBRAL DISC  . SPINAL STENOSIS  . SCIATICA  . ANSERINE BURSITIS, RIGHT  . Chronic constipation  . GERD (gastroesophageal reflux disease)  . Weakness of right leg  . OA (osteoarthritis)  . Stiffness of cervical spine    PT - End of Session Activity Tolerance: Patient tolerated treatment well General Behavior During Session: [facility] for tasks performed Cognition: [facility] for tasks performed   Seth Bake, PTA 05/15/2011, 5:49 PM

## 2011-05-15 NOTE — Progress Notes (Signed)
Physical Therapy Treatment Patient Details  Name: Zoe [name] MRN: [id number] Date of Birth: [birth year]  Today's Date: 05/15/2011 Time: 7846-9629 Time Calculation (min): 24 min Visit#: 7  of 16   Re-eval: 05/27/11 Charges: Therex x 23'  Subjective: Symptoms/Limitations Symptoms: Pt reports that her her right knee has been bothering her. Pain Assessment Currently in Pain?: Yes Pain Score:   4 Pain Location: Leg Pain Orientation: Right;Left   Exercise/Treatments Standing Heel Raises: 10 reps;Limitations Heel Raises Limitations: Toe raises x 10 Functional Squat: 10 reps Supine Bridges: 10 reps Straight Leg Raises: 10 reps;Both Sidelying Hip ABduction: 10 reps;Limitations Hip ABduction Limitations: bilateral Clams: 5x10" Prone  Hip Extension: 10 reps;Both Other Prone Exercises: heel squeeze 10x 5" Other Prone Exercises: glute set x 10  Physical Therapy Assessment and Plan Clinical impression statement: Pt tolerates therex well. Pt appears to be gaining hip strength/control. Pt continues to present with trendelenburg gait pattern. Plan: Continue to progress per PT POC.  Problem List Patient Active Problem List  Diagnoses  . HIP PAIN  . KNEE PAIN  . DEGEN LUMBAR/LUMBOSACRAL INTERVERTEBRAL DISC  . SPINAL STENOSIS  . SCIATICA  . ANSERINE BURSITIS, RIGHT  . Chronic constipation  . GERD (gastroesophageal reflux disease)  . Weakness of right leg  . OA (osteoarthritis)  . Stiffness of cervical spine    PT - End of Session Activity Tolerance: Patient tolerated treatment well General Behavior During Session: [facility] for tasks performed Cognition: [facility] for tasks performed    Seth Bake, PTA 05/15/2011, 4:32 PM

## 2011-05-21 ENCOUNTER — Ambulatory Visit (HOSPITAL_COMMUNITY)
Admission: RE | Admit: 2011-05-21 | Discharge: 2011-05-21 | Disposition: A | Discharge status: Home / Self Care | Payer: Medicare Other | Source: Ambulatory Visit | Attending: Rehabilitation | Admitting: Rehabilitation

## 2011-05-21 NOTE — Progress Notes (Signed)
Physical Therapy Treatment Patient Details  Name: Zoe [name] MRN: [id number] Date of Birth: 1935-06-08  Today's Date: 05/21/2011 Time: 7829-5621 Time Calculation (min): 26 min Visit#: 9  of 16   Re-eval: 05/27/11 Charges: Therex x 24'  Subjective: Symptoms/Limitations Symptoms: Pt states that she is having pain and swelling in her R leg. Pain Assessment Currently in Pain?: Yes Pain Score:   8 Pain Location: Leg Pain Orientation: Right   Exercise/Treatments  Supine Bridges: 10 reps Straight Leg Raises: 10 reps Sidelying Hip ABduction: 10 reps;Limitations Hip ABduction Limitations: bilateral Clams: 10x5" Prone  Hip Extension: 10 reps;Both Other Prone Exercises: heel squeeze 10x 5" Other Prone Exercises: glute set x 10   Physical Therapy Assessment and Plan PT Assessment and Plan Clinical Impression Statement: Standing exercises held secondary to increased pain. Pt completes all mat exercises well with good form and minimal need for cueing. Pt continues to receive pain relief from prone position. PT Plan: Continue to progress strength per PT POC.     Problem List Patient Active Problem List  Diagnoses  . HIP PAIN  . KNEE PAIN  . DEGEN LUMBAR/LUMBOSACRAL INTERVERTEBRAL DISC  . SPINAL STENOSIS  . SCIATICA  . ANSERINE BURSITIS, RIGHT  . Chronic constipation  . GERD (gastroesophageal reflux disease)  . Weakness of right leg  . OA (osteoarthritis)  . Stiffness of cervical spine    PT - End of Session Activity Tolerance: Patient tolerated treatment well General Behavior During Session: [facility] for tasks performed Cognition: [facility] for tasks performed   Antonieta Iba 05/21/2011, 4:33 PM

## 2011-05-21 NOTE — Progress Notes (Signed)
Physical Therapy Treatment Patient Details  Name: Zoe [name] MRN: [id number] Date of Birth: 08/13/1935  Today's Date: 05/21/2011 Time: 0865-7846 Time Calculation (min): 35 min Visit#: 8  of 16   Re-eval: 05/27/11 Charges: Manual x 12' MHP x 15' Therex x 5'   Subjective: Symptoms/Limitations Symptoms: I'm hurting all the way to my toes. Pain Assessment Currently in Pain?: Yes Pain Score:   7 Pain Location: Neck Pain Orientation: Left   Exercise/Treatments Stretches Upper Trapezius Stretch: 3 reps;30 seconds  Modalities Modalities: Moist Heat Manual Therapy Manual Therapy: Myofascial release Myofascial Release: MFR to L suboccipitals to decrease pain and tightness Other Manual Therapy: Sciatic nerve glides LLE  Moist Heat Therapy Number Minutes Moist Heat: 15 Minutes Moist Heat Location: Other (comment) (Cervical)  Physical Therapy Assessment and Plan PT Assessment and Plan Clinical Impression Statement: Pt with extreme tightness in L suboccipital region. MFR completed to L subocciptals to decrease pain and tightness. Nerve glides completed to LLE to decrease sciatic pain. Heat applied to cervical to decrease pain and tight ness. Pt reports neck pain decrease to 3/10 and sciatic sx decrease to 6/10 at end of session. PT Plan: Reassess next session.     Problem List Patient Active Problem List  Diagnoses  . HIP PAIN  . KNEE PAIN  . DEGEN LUMBAR/LUMBOSACRAL INTERVERTEBRAL DISC  . SPINAL STENOSIS  . SCIATICA  . ANSERINE BURSITIS, RIGHT  . Chronic constipation  . GERD (gastroesophageal reflux disease)  . Weakness of right leg  . OA (osteoarthritis)  . Stiffness of cervical spine    PT - End of Session Activity Tolerance: Patient tolerated treatment well General Behavior During Session: [facility] for tasks performed Cognition: [facility] for tasks performed  Seth Bake, PTA 05/21/2011, 5:56 PM

## 2011-05-27 ENCOUNTER — Telehealth (HOSPITAL_COMMUNITY): Payer: Self-pay

## 2011-05-28 ENCOUNTER — Ambulatory Visit (HOSPITAL_COMMUNITY): Payer: Medicare Other | Admitting: *Deleted

## 2011-05-30 ENCOUNTER — Ambulatory Visit (HOSPITAL_COMMUNITY)
Admission: RE | Admit: 2011-05-30 | Discharge: 2011-05-30 | Disposition: A | Discharge status: Home / Self Care | Payer: Medicare Other | Source: Ambulatory Visit | Attending: Family Medicine | Admitting: Family Medicine

## 2011-05-30 DIAGNOSIS — M25519 Pain in unspecified shoulder: Secondary | ICD-10-CM | POA: Insufficient documentation

## 2011-05-30 DIAGNOSIS — M436 Torticollis: Secondary | ICD-10-CM

## 2011-05-30 DIAGNOSIS — M25619 Stiffness of unspecified shoulder, not elsewhere classified: Secondary | ICD-10-CM | POA: Insufficient documentation

## 2011-05-30 DIAGNOSIS — M6281 Muscle weakness (generalized): Secondary | ICD-10-CM | POA: Insufficient documentation

## 2011-05-30 DIAGNOSIS — IMO0001 Reserved for inherently not codable concepts without codable children: Secondary | ICD-10-CM | POA: Insufficient documentation

## 2011-05-30 DIAGNOSIS — M542 Cervicalgia: Secondary | ICD-10-CM | POA: Insufficient documentation
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
## 2011-06-04 ENCOUNTER — Ambulatory Visit (HOSPITAL_COMMUNITY): Payer: Medicare Other | Admitting: Physical Therapy

## 2011-06-06 ENCOUNTER — Ambulatory Visit (HOSPITAL_COMMUNITY): Payer: Medicare Other | Admitting: Physical Therapy

## 2011-06-09 ENCOUNTER — Ambulatory Visit (HOSPITAL_COMMUNITY): Payer: Medicare Other | Admitting: Physical Therapy

## 2011-06-11 ENCOUNTER — Ambulatory Visit (HOSPITAL_COMMUNITY): Payer: Medicare Other

## 2011-06-18 ENCOUNTER — Ambulatory Visit (HOSPITAL_COMMUNITY): Payer: Medicare Other

## 2011-06-20 ENCOUNTER — Ambulatory Visit (HOSPITAL_COMMUNITY): Payer: Medicare Other

## 2011-06-25 ENCOUNTER — Ambulatory Visit (HOSPITAL_COMMUNITY): Payer: Medicare Other

## 2011-06-27 ENCOUNTER — Ambulatory Visit (HOSPITAL_COMMUNITY): Payer: Medicare Other | Admitting: Physical Therapy

## 2011-07-08 ENCOUNTER — Encounter: Payer: Medicare Other | Admitting: Internal Medicine

## 2011-07-09 ENCOUNTER — Encounter: Payer: Medicare Other | Admitting: Internal Medicine

## 2011-07-09 DIAGNOSIS — M1990 Unspecified osteoarthritis, unspecified site: Secondary | ICD-10-CM

## 2011-07-09 DIAGNOSIS — I82409 Acute embolism and thrombosis of unspecified deep veins of unspecified lower extremity: Secondary | ICD-10-CM

## 2011-07-09 DIAGNOSIS — E669 Obesity, unspecified: Secondary | ICD-10-CM

## 2011-08-18 ENCOUNTER — Other Ambulatory Visit (HOSPITAL_COMMUNITY): Payer: Self-pay | Admitting: Pulmonary Disease

## 2011-08-18 DIAGNOSIS — Z139 Encounter for screening, unspecified: Secondary | ICD-10-CM

## 2011-08-18 DIAGNOSIS — I739 Peripheral vascular disease, unspecified: Secondary | ICD-10-CM

## 2011-08-20 ENCOUNTER — Ambulatory Visit (HOSPITAL_COMMUNITY)
Admission: RE | Admit: 2011-08-20 | Discharge: 2011-08-20 | Disposition: A | Discharge status: Home / Self Care | Payer: Medicare Other | Source: Ambulatory Visit | Attending: Pulmonary Disease | Admitting: Pulmonary Disease

## 2011-08-20 DIAGNOSIS — I10 Essential (primary) hypertension: Secondary | ICD-10-CM | POA: Insufficient documentation

## 2011-08-20 DIAGNOSIS — M79609 Pain in unspecified limb: Secondary | ICD-10-CM | POA: Insufficient documentation

## 2011-08-20 DIAGNOSIS — I739 Peripheral vascular disease, unspecified: Secondary | ICD-10-CM | POA: Insufficient documentation

## 2012-02-10 ENCOUNTER — Other Ambulatory Visit (HOSPITAL_COMMUNITY): Payer: Self-pay | Admitting: Orthopedic Surgery

## 2012-03-01 ENCOUNTER — Other Ambulatory Visit: Payer: Self-pay | Admitting: Orthopedic Surgery

## 2012-03-01 DIAGNOSIS — M545 Low back pain, unspecified: Secondary | ICD-10-CM

## 2012-03-10 ENCOUNTER — Encounter (HOSPITAL_COMMUNITY): Payer: Self-pay

## 2012-03-16 ENCOUNTER — Encounter (HOSPITAL_COMMUNITY): Payer: Self-pay

## 2012-03-16 ENCOUNTER — Ambulatory Visit (HOSPITAL_COMMUNITY)
Admission: RE | Admit: 2012-03-16 | Discharge: 2012-03-16 | Disposition: A | Discharge status: Home / Self Care | Payer: Medicare Other | Source: Ambulatory Visit | Attending: Orthopedic Surgery | Admitting: Orthopedic Surgery

## 2012-03-16 ENCOUNTER — Encounter (HOSPITAL_COMMUNITY)
Admission: RE | Admit: 2012-03-16 | Discharge: 2012-03-16 | Disposition: A | Discharge status: Home / Self Care | Payer: Medicare Other | Source: Ambulatory Visit | Attending: Orthopedic Surgery | Admitting: Orthopedic Surgery

## 2012-03-16 DIAGNOSIS — Z01812 Encounter for preprocedural laboratory examination: Secondary | ICD-10-CM | POA: Insufficient documentation

## 2012-03-16 DIAGNOSIS — Z01818 Encounter for other preprocedural examination: Secondary | ICD-10-CM | POA: Insufficient documentation

## 2012-03-16 HISTORY — DX: Pneumonia, unspecified organism: J18.9

## 2012-03-16 HISTORY — DX: Anxiety disorder, unspecified: F41.9

## 2012-03-16 HISTORY — DX: Unspecified asthma, uncomplicated: J45.909

## 2012-03-16 HISTORY — DX: Malignant (primary) neoplasm, unspecified: C80.1

## 2012-03-16 HISTORY — DX: Urgency of urination: R39.15

## 2012-03-16 HISTORY — DX: Major depressive disorder, single episode, unspecified: F32.9

## 2012-03-16 HISTORY — DX: Pure hypercholesterolemia, unspecified: E78.00

## 2012-03-16 HISTORY — DX: Depression, unspecified: F32.A

## 2012-03-16 HISTORY — DX: Other allergy status, other than to drugs and biological substances: Z91.09

## 2012-03-16 HISTORY — DX: Cardiac arrhythmia, unspecified: I49.9

## 2012-03-16 HISTORY — DX: Unspecified osteoarthritis, unspecified site: M19.90

## 2012-03-16 LAB — COMPREHENSIVE METABOLIC PANEL
ALT: 20 U/L (ref 0–35)
AST: 23 U/L (ref 0–37)
Albumin: 3.9 g/dL (ref 3.5–5.2)
Alkaline Phosphatase: 78 U/L (ref 39–117)
BUN: 15 mg/dL (ref 6–23)
CO2: 29 mEq/L (ref 19–32)
Calcium: 10.1 mg/dL (ref 8.4–10.5)
Chloride: 102 mEq/L (ref 96–112)
Creatinine, Ser: 0.6 mg/dL (ref 0.50–1.10)
GFR calc Af Amer: >90 mL/min (ref >90)
GFR calc non Af Amer: 88 mL/min — ABNORMAL LOW (ref >90)
Glucose, Bld: 96 mg/dL (ref 70–99)
Potassium: 4.7 mEq/L (ref 3.5–5.1)
Sodium: 143 mEq/L (ref 135–145)
Total Bilirubin: 0.5 mg/dL (ref 0.3–1.2)
Total Protein: 7.5 g/dL (ref 6.0–8.3)

## 2012-03-16 LAB — SURGICAL PCR SCREEN
MRSA, PCR: NEGATIVE
Staphylococcus aureus: POSITIVE — AB

## 2012-03-16 LAB — CBC
HCT: 38.6 % (ref 36.0–46.0)
Hemoglobin: 13 g/dL (ref 12.0–15.0)
MCH: 32.7 pg (ref 26.0–34.0)
MCHC: 33.7 g/dL (ref 30.0–36.0)
MCV: 97.2 fL (ref 78.0–100.0)
Platelets: 175 10*3/uL (ref 150–400)
RBC: 3.97 MIL/uL (ref 3.87–5.11)
RDW: 13.8 % (ref 11.5–15.5)
WBC: 9 10*3/uL (ref 4.0–10.5)

## 2012-03-16 LAB — TYPE AND SCREEN
ABO/RH(D): O NEG
Antibody Screen: NEGATIVE

## 2012-03-16 LAB — APTT: aPTT: 27 seconds (ref 24–37)

## 2012-03-16 LAB — ABO/RH: ABO/RH(D): O NEG

## 2012-03-16 LAB — PROTIME-INR
INR: 0.96 (ref 0.00–1.49)
Prothrombin Time: 12.7 seconds (ref 11.6–15.2)

## 2012-03-16 NOTE — Progress Notes (Signed)
PCP is Dr. Kari Baars in [place], [place]. Patient denied having a stress test, cardiac cath, or sleep study. Patient informed Nurse that she had not had a asthma attack in several years, and patient stated she has a fast heartbeat but was unsure if it was tachycardia or fibrillation. Will request records from PCP.

## 2012-03-16 NOTE — Pre-Procedure Instructions (Addendum)
Zoe [name]  03/16/2012   Your procedure is scheduled on:  Wednesday March 24, 2012  Report to [facility] at 6:30 AM.  Call this number if you have problems the morning of surgery: [phone]   Remember:   Do not eat food or drink liquids after midnight.   Take these medicines the morning of surgery with A SIP OF WATER: albuterol (bring day of surgery), xanax, amlodipine, gabapentin, tramadol, uloric   Do not wear jewelry, make-up or nail polish.  Do not wear lotions, powders, or perfumes.  Do not shave 48 hours prior to surgery.   Do not bring valuables to the hospital.  Contacts, dentures or bridgework may not be worn into surgery.  Leave suitcase in the car. After surgery it may be brought to your room.  For patients admitted to the hospital, checkout time is 11:00 AM the day of  discharge.   Patients discharged the day of surgery will not be allowed to drive home.  Name and phone number of your driver: family / friend  Special Instructions: Shower using CHG 2 nights before surgery and the night before surgery.  If you shower the day of surgery use CHG.  Use special wash - you have one bottle of CHG for all showers.  You should use approximately 1/3 of the bottle for each shower.   Please read over the following fact sheets that you were given: Pain Booklet, Coughing and Deep Breathing, Blood Transfusion Information, Total Joint Packet, MRSA Information and Surgical Site Infection Prevention

## 2012-03-23 MED ORDER — BUPIVACAINE-EPINEPHRINE PF 0.25-1:200000 % IJ SOLN
INTRAMUSCULAR | Status: AC
  Filled 2012-03-23: qty 30

## 2012-04-12 ENCOUNTER — Encounter (HOSPITAL_COMMUNITY)
Admission: RE | Admit: 2012-04-12 | Discharge: 2012-04-12 | Disposition: A | Discharge status: Home / Self Care | Payer: Medicare Other | Source: Ambulatory Visit | Attending: Orthopedic Surgery | Admitting: Orthopedic Surgery

## 2012-04-12 NOTE — Pre-Procedure Instructions (Signed)
Zoe [name]  04/12/2012   Your procedure is scheduled on:  Wednesday, March 19th.  Report to [facility] at 6:30 AM.  Call this number if you have problems the morning of surgery: [phone]   Remember:   Do not eat food or drink liquids after midnight.   Take these medicines the morning of surgery with A SIP OF WATER: Amlodipine (Norvasc), Febuxostat (Uloric) and Gabapentin (Neurotin).   May use Albuterol inhaler and bring inhaler to the hospital with you. May take Alprazolam ( Xanax ) and Tramadol (Ultram) if needed.  Stop taking Aspirin, Coumadin, Plavix, Effient and Herbal medications.  Do not take any NSAIDs ie: Etodolac (Lodine), Ibuprofen,  Advil,Naproxen or any medication containing Aspirin.    Do not wear jewelry, make-up or nail polish.  Do not wear lotions, powders, or perfumes. You may wear deodorant.  Do not shave 48 hours prior to surgery.   Do not bring valuables to the hospital.  Contacts, dentures or bridgework may not be worn into surgery.  Leave suitcase in the car. After surgery it may be brought to your room.  For patients admitted to the hospital, checkout time is 11:00 AM the day of  discharge.     Special Instructions: Shower using CHG 2 nights before surgery and the night before surgery.  If you shower the day of surgery use CHG.  Use special wash - you have one bottle of CHG for all showers.  You should use approximately 1/3 of the bottle for each shower.   Please read over the following fact sheets that you were given: Pain Booklet, Coughing and Deep Breathing, Blood Transfusion Information and Surgical Site Infection Prevention

## 2012-04-14 ENCOUNTER — Inpatient Hospital Stay (HOSPITAL_COMMUNITY): Admission: RE | Admit: 2012-04-14 | Payer: Medicare Other | Source: Ambulatory Visit | Admitting: Orthopedic Surgery

## 2012-04-14 ENCOUNTER — Encounter (HOSPITAL_COMMUNITY): Admission: RE | Payer: Self-pay | Source: Ambulatory Visit

## 2012-04-14 SURGERY — ARTHROPLASTY, HIP, TOTAL,POSTERIOR APPROACH
Anesthesia: General | Site: Hip | Laterality: Right

## 2012-08-25 ENCOUNTER — Other Ambulatory Visit (HOSPITAL_COMMUNITY): Payer: Self-pay | Admitting: Orthopaedic Surgery

## 2012-09-03 ENCOUNTER — Encounter (HOSPITAL_COMMUNITY): Payer: Self-pay | Admitting: Pharmacy Technician

## 2012-09-07 ENCOUNTER — Encounter (HOSPITAL_COMMUNITY)
Admission: RE | Admit: 2012-09-07 | Discharge: 2012-09-07 | Disposition: A | Discharge status: Home / Self Care | Payer: Medicare Other | Source: Ambulatory Visit | Attending: Orthopaedic Surgery | Admitting: Orthopaedic Surgery

## 2012-09-07 ENCOUNTER — Encounter (HOSPITAL_COMMUNITY): Payer: Self-pay

## 2012-09-07 DIAGNOSIS — Z01818 Encounter for other preprocedural examination: Secondary | ICD-10-CM | POA: Insufficient documentation

## 2012-09-07 DIAGNOSIS — Z01810 Encounter for preprocedural cardiovascular examination: Secondary | ICD-10-CM | POA: Insufficient documentation

## 2012-09-07 DIAGNOSIS — Z01812 Encounter for preprocedural laboratory examination: Secondary | ICD-10-CM | POA: Insufficient documentation

## 2012-09-07 HISTORY — DX: Fibromyalgia: M79.7

## 2012-09-07 LAB — APTT: aPTT: 28 seconds (ref 24–37)

## 2012-09-07 LAB — URINALYSIS, ROUTINE W REFLEX MICROSCOPIC
Bilirubin Urine: NEGATIVE
Glucose, UA: NEGATIVE mg/dL
Hgb urine dipstick: NEGATIVE
Ketones, ur: NEGATIVE mg/dL
Leukocytes, UA: NEGATIVE
Nitrite: NEGATIVE
Protein, ur: NEGATIVE mg/dL
Specific Gravity, Urine: 1.015 (ref 1.005–1.030)
Urobilinogen, UA: 0.2 mg/dL (ref 0.0–1.0)
pH: 5 (ref 5.0–8.0)

## 2012-09-07 LAB — TYPE AND SCREEN
ABO/RH(D): O NEG
Antibody Screen: NEGATIVE

## 2012-09-07 LAB — BASIC METABOLIC PANEL
BUN: 14 mg/dL (ref 6–23)
CO2: 28 mEq/L (ref 19–32)
Calcium: 9.8 mg/dL (ref 8.4–10.5)
Chloride: 103 mEq/L (ref 96–112)
Creatinine, Ser: 0.59 mg/dL (ref 0.50–1.10)
GFR calc Af Amer: >90 mL/min (ref >90)
GFR calc non Af Amer: 88 mL/min — ABNORMAL LOW (ref >90)
Glucose, Bld: 106 mg/dL — ABNORMAL HIGH (ref 70–99)
Potassium: 4.5 mEq/L (ref 3.5–5.1)
Sodium: 140 mEq/L (ref 135–145)

## 2012-09-07 LAB — CBC
HCT: 38.3 % (ref 36.0–46.0)
Hemoglobin: 12.9 g/dL (ref 12.0–15.0)
MCH: 32.6 pg (ref 26.0–34.0)
MCHC: 33.7 g/dL (ref 30.0–36.0)
MCV: 96.7 fL (ref 78.0–100.0)
Platelets: 156 10*3/uL (ref 150–400)
RBC: 3.96 MIL/uL (ref 3.87–5.11)
RDW: 13.8 % (ref 11.5–15.5)
WBC: 8.5 10*3/uL (ref 4.0–10.5)

## 2012-09-07 LAB — PROTIME-INR
INR: 1 (ref 0.00–1.49)
Prothrombin Time: 13 seconds (ref 11.6–15.2)

## 2012-09-07 LAB — SURGICAL PCR SCREEN
MRSA, PCR: NEGATIVE
Staphylococcus aureus: POSITIVE — AB

## 2012-09-07 NOTE — Pre-Procedure Instructions (Signed)
Zoe [name]  09/07/2012   Your procedure is scheduled on:  September 14, 2012 at 1:00 PM  Report to [facility] at 11:00 AM.  Call this number if you have problems the morning of surgery: [phone]   Remember:   Do not eat food or drink liquids after midnight.   Take these medicines the morning of surgery with A SIP OF WATER: albuterol (PROVENTIL HFA;VENTOLIN HFA) inhaler, ALPRAZolam (XANAX), amLODipine (NORVASC), digoxin (LANOXIN), gabapentin (NEURONTIN), and lansoprazole (PREVACID)  STOP all Vitamins, Aspirin, Non-steroidals (examples are Naproxen, Ibuprofen, Motrin, Aleve), Fish Oil 7 days prior to surgery.       Do not wear jewelry, make-up or nail polish.  Do not wear lotions, powders, or perfumes. You may wear deodorant.  Do not shave 48 hours prior to surgery. Men may shave face and neck.  Do not bring valuables to the hospital.  [facility] is not responsible                   for any belongings or valuables.  Contacts, dentures or bridgework may not be worn into surgery.  Leave suitcase in the car. After surgery it may be brought to your room.  For patients admitted to the hospital, checkout time is 11:00 AM the day of  discharge.    Special Instructions: Shower using CHG 2 nights before surgery and the night before surgery.  If you shower the day of surgery use CHG.  Use special wash - you have one bottle of CHG for all showers.  You should use approximately 1/3 of the bottle for each shower.   Please read over the following fact sheets that you were given: Pain Booklet, Coughing and Deep Breathing, Blood Transfusion Information, Total Joint Packet, MRSA Information and Surgical Site Infection Prevention

## 2012-09-13 ENCOUNTER — Other Ambulatory Visit (HOSPITAL_COMMUNITY): Payer: Self-pay | Admitting: Orthopaedic Surgery

## 2012-09-13 MED ORDER — CEFAZOLIN SODIUM-DEXTROSE 2-3 GM-% IV SOLR
2.0000 g | INTRAVENOUS | Status: AC
  Administered 2012-09-14: 2 g via INTRAVENOUS
  Filled 2012-09-13: qty 50

## 2012-09-13 NOTE — Progress Notes (Signed)
Notified patient of time change, instructed her to arrive at  1030 am 09/14/12.

## 2012-09-14 ENCOUNTER — Encounter (HOSPITAL_COMMUNITY): Payer: Self-pay | Admitting: Certified Registered Nurse Anesthetist

## 2012-09-14 ENCOUNTER — Inpatient Hospital Stay (HOSPITAL_COMMUNITY): Payer: Medicare Other

## 2012-09-14 ENCOUNTER — Encounter (HOSPITAL_COMMUNITY)
Admission: RE | Disposition: A | Discharge status: Skilled Nursing Facility | Payer: Self-pay | Source: Ambulatory Visit | Attending: Orthopaedic Surgery

## 2012-09-14 ENCOUNTER — Encounter (HOSPITAL_COMMUNITY): Payer: Self-pay | Admitting: *Deleted

## 2012-09-14 ENCOUNTER — Inpatient Hospital Stay (HOSPITAL_COMMUNITY)
Admission: RE | Admit: 2012-09-14 | Discharge: 2012-09-17 | DRG: 470 | Disposition: A | Discharge status: Skilled Nursing Facility | Payer: Medicare Other | Source: Ambulatory Visit | Attending: Orthopaedic Surgery | Admitting: Orthopaedic Surgery

## 2012-09-14 ENCOUNTER — Inpatient Hospital Stay (HOSPITAL_COMMUNITY): Payer: Medicare Other | Admitting: Certified Registered Nurse Anesthetist

## 2012-09-14 DIAGNOSIS — D62 Acute posthemorrhagic anemia: Secondary | ICD-10-CM | POA: Diagnosis not present

## 2012-09-14 DIAGNOSIS — F329 Major depressive disorder, single episode, unspecified: Secondary | ICD-10-CM | POA: Diagnosis present

## 2012-09-14 DIAGNOSIS — K5900 Constipation, unspecified: Secondary | ICD-10-CM | POA: Diagnosis present

## 2012-09-14 DIAGNOSIS — K219 Gastro-esophageal reflux disease without esophagitis: Secondary | ICD-10-CM | POA: Diagnosis present

## 2012-09-14 DIAGNOSIS — E7800 Pure hypercholesterolemia, unspecified: Secondary | ICD-10-CM | POA: Diagnosis present

## 2012-09-14 DIAGNOSIS — Z9849 Cataract extraction status, unspecified eye: Secondary | ICD-10-CM

## 2012-09-14 DIAGNOSIS — F3289 Other specified depressive episodes: Secondary | ICD-10-CM | POA: Diagnosis present

## 2012-09-14 DIAGNOSIS — J45909 Unspecified asthma, uncomplicated: Secondary | ICD-10-CM | POA: Diagnosis present

## 2012-09-14 DIAGNOSIS — M169 Osteoarthritis of hip, unspecified: Principal | ICD-10-CM | POA: Diagnosis present

## 2012-09-14 DIAGNOSIS — Z7982 Long term (current) use of aspirin: Secondary | ICD-10-CM

## 2012-09-14 DIAGNOSIS — M1610 Unilateral primary osteoarthritis, unspecified hip: Principal | ICD-10-CM

## 2012-09-14 DIAGNOSIS — M25569 Pain in unspecified knee: Secondary | ICD-10-CM

## 2012-09-14 DIAGNOSIS — Z9089 Acquired absence of other organs: Secondary | ICD-10-CM

## 2012-09-14 DIAGNOSIS — F17200 Nicotine dependence, unspecified, uncomplicated: Secondary | ICD-10-CM | POA: Diagnosis present

## 2012-09-14 DIAGNOSIS — F411 Generalized anxiety disorder: Secondary | ICD-10-CM | POA: Diagnosis present

## 2012-09-14 DIAGNOSIS — Z79899 Other long term (current) drug therapy: Secondary | ICD-10-CM

## 2012-09-14 DIAGNOSIS — R339 Retention of urine, unspecified: Secondary | ICD-10-CM | POA: Diagnosis not present

## 2012-09-14 DIAGNOSIS — IMO0001 Reserved for inherently not codable concepts without codable children: Secondary | ICD-10-CM | POA: Diagnosis present

## 2012-09-14 DIAGNOSIS — I10 Essential (primary) hypertension: Secondary | ICD-10-CM | POA: Diagnosis present

## 2012-09-14 HISTORY — PX: TOTAL HIP ARTHROPLASTY: SHX124

## 2012-09-14 SURGERY — ARTHROPLASTY, HIP, TOTAL, ANTERIOR APPROACH
Anesthesia: General | Site: Hip | Laterality: Right | Wound class: Clean

## 2012-09-14 MED ORDER — NEOSTIGMINE METHYLSULFATE 1 MG/ML IJ SOLN
INTRAMUSCULAR | Status: DC | PRN
  Administered 2012-09-14: 3 mg via INTRAVENOUS

## 2012-09-14 MED ORDER — METHOCARBAMOL 500 MG PO TABS
500.0000 mg | ORAL_TABLET | Freq: Four times a day (QID) | ORAL | Status: DC | PRN
  Administered 2012-09-15 – 2012-09-17 (×4): 500 mg via ORAL
  Filled 2012-09-14 (×4): qty 1

## 2012-09-14 MED ORDER — ASPIRIN EC 325 MG PO TBEC
325.0000 mg | DELAYED_RELEASE_TABLET | Freq: Two times a day (BID) | ORAL | Status: DC
  Administered 2012-09-15 – 2012-09-17 (×5): 325 mg via ORAL
  Filled 2012-09-14 (×7): qty 1

## 2012-09-14 MED ORDER — SODIUM CHLORIDE 0.9 % IV SOLN
INTRAVENOUS | Status: DC
  Administered 2012-09-14: 1000 mL via INTRAVENOUS
  Administered 2012-09-15 (×2): via INTRAVENOUS

## 2012-09-14 MED ORDER — GABAPENTIN 300 MG PO CAPS
300.0000 mg | ORAL_CAPSULE | Freq: Two times a day (BID) | ORAL | Status: DC
  Administered 2012-09-14 – 2012-09-17 (×6): 300 mg via ORAL
  Filled 2012-09-14 (×7): qty 1

## 2012-09-14 MED ORDER — HYDROMORPHONE HCL PF 1 MG/ML IJ SOLN
1.0000 mg | INTRAMUSCULAR | Status: DC | PRN
  Administered 2012-09-14 – 2012-09-15 (×8): 1 mg via INTRAVENOUS
  Filled 2012-09-14 (×8): qty 1

## 2012-09-14 MED ORDER — LIDOCAINE HCL (CARDIAC) 20 MG/ML IV SOLN
INTRAVENOUS | Status: DC | PRN
  Administered 2012-09-14: 100 mg via INTRAVENOUS

## 2012-09-14 MED ORDER — CEFAZOLIN SODIUM 1-5 GM-% IV SOLN
1.0000 g | Freq: Four times a day (QID) | INTRAVENOUS | Status: AC
  Administered 2012-09-14 – 2012-09-15 (×2): 1 g via INTRAVENOUS
  Filled 2012-09-14 (×2): qty 50

## 2012-09-14 MED ORDER — ALLOPURINOL 300 MG PO TABS
300.0000 mg | ORAL_TABLET | Freq: Every day | ORAL | Status: DC
  Administered 2012-09-15 – 2012-09-17 (×3): 300 mg via ORAL
  Filled 2012-09-14 (×3): qty 1

## 2012-09-14 MED ORDER — MENTHOL 3 MG MT LOZG: 1.0000 | LOZENGE | OROMUCOSAL | Status: DC | PRN

## 2012-09-14 MED ORDER — GLYCOPYRROLATE 0.2 MG/ML IJ SOLN
INTRAMUSCULAR | Status: DC | PRN
  Administered 2012-09-14: 0.1 mg via INTRAVENOUS
  Administered 2012-09-14: 0.6 mg via INTRAVENOUS

## 2012-09-14 MED ORDER — 0.9 % SODIUM CHLORIDE (POUR BTL) OPTIME
TOPICAL | Status: DC | PRN
  Administered 2012-09-14: 1000 mL

## 2012-09-14 MED ORDER — PROMETHAZINE HCL 25 MG/ML IJ SOLN: 6.2500 mg | INTRAMUSCULAR | Status: DC | PRN

## 2012-09-14 MED ORDER — HYDROMORPHONE HCL PF 1 MG/ML IJ SOLN
0.2500 mg | INTRAMUSCULAR | Status: DC | PRN
  Administered 2012-09-14 (×4): 0.5 mg via INTRAVENOUS

## 2012-09-14 MED ORDER — MIDAZOLAM HCL 5 MG/5ML IJ SOLN
INTRAMUSCULAR | Status: DC | PRN
  Administered 2012-09-14: 1 mg via INTRAVENOUS

## 2012-09-14 MED ORDER — PHENOL 1.4 % MT LIQD: 1.0000 | OROMUCOSAL | Status: DC | PRN

## 2012-09-14 MED ORDER — DOXEPIN HCL 100 MG PO CAPS
100.0000 mg | ORAL_CAPSULE | Freq: Every day | ORAL | Status: DC
  Administered 2012-09-14 – 2012-09-16 (×3): 100 mg via ORAL
  Filled 2012-09-14 (×4): qty 1

## 2012-09-14 MED ORDER — ZOLPIDEM TARTRATE 5 MG PO TABS
5.0000 mg | ORAL_TABLET | Freq: Every evening | ORAL | Status: DC | PRN
  Administered 2012-09-16: 5 mg via ORAL
  Filled 2012-09-14: qty 1

## 2012-09-14 MED ORDER — METOCLOPRAMIDE HCL 5 MG/ML IJ SOLN
5.0000 mg | Freq: Three times a day (TID) | INTRAMUSCULAR | Status: DC | PRN
  Administered 2012-09-15: 10 mg via INTRAVENOUS
  Filled 2012-09-14: qty 2

## 2012-09-14 MED ORDER — BUPIVACAINE HCL 0.25 % IJ SOLN
INTRAMUSCULAR | Status: DC | PRN
  Administered 2012-09-14: 4 mL

## 2012-09-14 MED ORDER — HYDROMORPHONE HCL PF 1 MG/ML IJ SOLN
INTRAMUSCULAR | Status: AC
  Filled 2012-09-14: qty 1

## 2012-09-14 MED ORDER — ALBUTEROL SULFATE HFA 108 (90 BASE) MCG/ACT IN AERS: 2.0000 | INHALATION_SPRAY | Freq: Four times a day (QID) | RESPIRATORY_TRACT | Status: DC | PRN

## 2012-09-14 MED ORDER — ALUM & MAG HYDROXIDE-SIMETH 200-200-20 MG/5ML PO SUSP: 30.0000 mL | ORAL | Status: DC | PRN

## 2012-09-14 MED ORDER — METHYLPREDNISOLONE ACETATE 40 MG/ML IJ SUSP
INTRAMUSCULAR | Status: DC | PRN
  Administered 2012-09-14: 40 mg

## 2012-09-14 MED ORDER — OXYCODONE HCL 5 MG PO TABS: 5.0000 mg | ORAL_TABLET | ORAL | Status: DC | PRN

## 2012-09-14 MED ORDER — PANTOPRAZOLE SODIUM 40 MG PO TBEC
40.0000 mg | DELAYED_RELEASE_TABLET | Freq: Every day | ORAL | Status: DC
  Administered 2012-09-15 – 2012-09-17 (×3): 40 mg via ORAL
  Filled 2012-09-14 (×4): qty 1

## 2012-09-14 MED ORDER — HYDROMORPHONE HCL PF 1 MG/ML IJ SOLN
INTRAMUSCULAR | Status: DC | PRN
  Administered 2012-09-14: 0.5 mg via INTRAVENOUS

## 2012-09-14 MED ORDER — ACETAMINOPHEN 325 MG PO TABS: 650.0000 mg | ORAL_TABLET | Freq: Four times a day (QID) | ORAL | Status: DC | PRN

## 2012-09-14 MED ORDER — AMLODIPINE BESYLATE 2.5 MG PO TABS
2.5000 mg | ORAL_TABLET | Freq: Every morning | ORAL | Status: DC
Start: 2012-09-15 — End: 2012-09-17
  Administered 2012-09-15 – 2012-09-17 (×3): 2.5 mg via ORAL
  Filled 2012-09-14 (×3): qty 1

## 2012-09-14 MED ORDER — METHOCARBAMOL 100 MG/ML IJ SOLN
500.0000 mg | Freq: Four times a day (QID) | INTRAVENOUS | Status: DC | PRN
  Filled 2012-09-14: qty 5

## 2012-09-14 MED ORDER — ADULT MULTIVITAMIN W/MINERALS CH
1.0000 | ORAL_TABLET | Freq: Every day | ORAL | Status: DC
  Administered 2012-09-15 – 2012-09-17 (×3): 1 via ORAL
  Filled 2012-09-14 (×3): qty 1

## 2012-09-14 MED ORDER — FENTANYL CITRATE 0.05 MG/ML IJ SOLN
INTRAMUSCULAR | Status: DC | PRN
  Administered 2012-09-14: 50 ug via INTRAVENOUS
  Administered 2012-09-14: 100 ug via INTRAVENOUS
  Administered 2012-09-14 (×7): 50 ug via INTRAVENOUS

## 2012-09-14 MED ORDER — DIGOXIN 250 MCG PO TABS
250.0000 ug | ORAL_TABLET | Freq: Every day | ORAL | Status: DC
  Administered 2012-09-15 – 2012-09-16 (×2): 250 ug via ORAL
  Filled 2012-09-14 (×4): qty 1

## 2012-09-14 MED ORDER — ONDANSETRON HCL 4 MG PO TABS: 4.0000 mg | ORAL_TABLET | Freq: Four times a day (QID) | ORAL | Status: DC | PRN

## 2012-09-14 MED ORDER — SODIUM CHLORIDE 0.9 % IR SOLN
Status: DC | PRN
  Administered 2012-09-14: 3000 mL

## 2012-09-14 MED ORDER — ALPRAZOLAM 0.5 MG PO TABS
0.5000 mg | ORAL_TABLET | Freq: Three times a day (TID) | ORAL | Status: DC | PRN
  Administered 2012-09-15 – 2012-09-17 (×5): 0.5 mg via ORAL
  Filled 2012-09-14 (×5): qty 1

## 2012-09-14 MED ORDER — DIPHENHYDRAMINE HCL 12.5 MG/5ML PO ELIX: 12.5000 mg | ORAL_SOLUTION | ORAL | Status: DC | PRN

## 2012-09-14 MED ORDER — ONDANSETRON HCL 4 MG/2ML IJ SOLN
4.0000 mg | Freq: Four times a day (QID) | INTRAMUSCULAR | Status: DC | PRN
  Administered 2012-09-15 (×2): 4 mg via INTRAVENOUS
  Filled 2012-09-14 (×3): qty 2

## 2012-09-14 MED ORDER — FENTANYL CITRATE 0.05 MG/ML IJ SOLN: INTRAMUSCULAR | Status: DC | PRN

## 2012-09-14 MED ORDER — METHYLPREDNISOLONE ACETATE 40 MG/ML IJ SUSP
INTRAMUSCULAR | Status: AC
  Filled 2012-09-14: qty 1

## 2012-09-14 MED ORDER — ROCURONIUM BROMIDE 100 MG/10ML IV SOLN
INTRAVENOUS | Status: DC | PRN
  Administered 2012-09-14: 50 mg via INTRAVENOUS
  Administered 2012-09-14: 10 mg via INTRAVENOUS

## 2012-09-14 MED ORDER — BUPIVACAINE HCL (PF) 0.25 % IJ SOLN
INTRAMUSCULAR | Status: AC
  Filled 2012-09-14: qty 30

## 2012-09-14 MED ORDER — METOCLOPRAMIDE HCL 10 MG PO TABS: 5.0000 mg | ORAL_TABLET | Freq: Three times a day (TID) | ORAL | Status: DC | PRN

## 2012-09-14 MED ORDER — ACETAMINOPHEN 650 MG RE SUPP: 650.0000 mg | Freq: Four times a day (QID) | RECTAL | Status: DC | PRN

## 2012-09-14 MED ORDER — MAGNESIUM CITRATE PO SOLN: 1.0000 | Freq: Once | ORAL | Status: AC | PRN

## 2012-09-14 MED ORDER — PROPOFOL 10 MG/ML IV BOLUS
INTRAVENOUS | Status: DC | PRN
  Administered 2012-09-14: 150 mg via INTRAVENOUS

## 2012-09-14 MED ORDER — POLYETHYLENE GLYCOL 3350 17 G PO PACK
17.0000 g | PACK | Freq: Every day | ORAL | Status: DC | PRN
  Administered 2012-09-15 – 2012-09-16 (×2): 17 g via ORAL
  Filled 2012-09-14 (×3): qty 1

## 2012-09-14 MED ORDER — LACTATED RINGERS IV SOLN
INTRAVENOUS | Status: DC
  Administered 2012-09-14 (×3): via INTRAVENOUS

## 2012-09-14 MED ORDER — TRAMADOL HCL 50 MG PO TABS
100.0000 mg | ORAL_TABLET | Freq: Four times a day (QID) | ORAL | Status: DC | PRN
  Administered 2012-09-15: 100 mg via ORAL
  Filled 2012-09-14: qty 2

## 2012-09-14 MED ORDER — DOCUSATE SODIUM 100 MG PO CAPS
100.0000 mg | ORAL_CAPSULE | Freq: Two times a day (BID) | ORAL | Status: DC
  Administered 2012-09-14 – 2012-09-17 (×6): 100 mg via ORAL
  Filled 2012-09-14 (×7): qty 1

## 2012-09-14 MED ORDER — SORBITOL 70 % SOLN
30.0000 mL | Freq: Every day | Status: DC | PRN
  Administered 2012-09-15 – 2012-09-16 (×2): 30 mL via ORAL
  Filled 2012-09-14 (×2): qty 30

## 2012-09-14 MED ORDER — ONDANSETRON HCL 4 MG/2ML IJ SOLN
INTRAMUSCULAR | Status: DC | PRN
  Administered 2012-09-14: 4 mg via INTRAVENOUS

## 2012-09-14 SURGICAL SUPPLY — 53 items
BANDAGE GAUZE ELAST BULKY 4 IN (GAUZE/BANDAGES/DRESSINGS) IMPLANT
BLADE SAW SGTL 18X1.27X75 (BLADE) ×2 IMPLANT
BLADE SURG ROTATE 9660 (MISCELLANEOUS) IMPLANT
CAPT HIP PF MOP ×2 IMPLANT
CELLS DAT CNTRL 66122 CELL SVR (MISCELLANEOUS) IMPLANT
CLOTH BEACON ORANGE TIMEOUT ST (SAFETY) ×2 IMPLANT
COVER BACK TABLE 24X17X13 BIG (DRAPES) IMPLANT
COVER SURGICAL LIGHT HANDLE (MISCELLANEOUS) ×2 IMPLANT
DERMABOND ADHESIVE PROPEN (GAUZE/BANDAGES/DRESSINGS) ×1
DERMABOND ADVANCED .7 DNX6 (GAUZE/BANDAGES/DRESSINGS) ×1 IMPLANT
DRAPE C-ARM 42X72 X-RAY (DRAPES) ×2 IMPLANT
DRAPE STERI IOBAN 125X83 (DRAPES) ×4 IMPLANT
DRAPE U-SHAPE 47X51 STRL (DRAPES) ×6 IMPLANT
DRSG AQUACEL AG ADV 3.5X10 (GAUZE/BANDAGES/DRESSINGS) ×2 IMPLANT
DURAPREP 26ML APPLICATOR (WOUND CARE) ×2 IMPLANT
ELECT BLADE 4.0 EZ CLEAN MEGAD (MISCELLANEOUS) ×2
ELECT BLADE TIP CTD 4 INCH (ELECTRODE) IMPLANT
ELECT CAUTERY BLADE 6.4 (BLADE) IMPLANT
ELECT REM PT RETURN 9FT ADLT (ELECTROSURGICAL) ×2
ELECTRODE BLDE 4.0 EZ CLN MEGD (MISCELLANEOUS) ×1 IMPLANT
ELECTRODE REM PT RTRN 9FT ADLT (ELECTROSURGICAL) ×1 IMPLANT
ELIMINATOR HOLE APEX DEPUY (Hips) ×2 IMPLANT
FACESHIELD LNG OPTICON STERILE (SAFETY) ×4 IMPLANT
GAUZE XEROFORM 1X8 LF (GAUZE/BANDAGES/DRESSINGS) IMPLANT
GLOVE BIOGEL PI IND STRL 8 (GLOVE) ×2 IMPLANT
GLOVE BIOGEL PI INDICATOR 8 (GLOVE) ×2
GLOVE ECLIPSE 8.0 STRL XLNG CF (GLOVE) ×2 IMPLANT
GLOVE SURG ORTHO 8.0 STRL STRW (GLOVE) ×2 IMPLANT
GOWN STRL REIN XL XLG (GOWN DISPOSABLE) ×4 IMPLANT
HANDPIECE INTERPULSE COAX TIP (DISPOSABLE) ×2
KIT BASIN OR (CUSTOM PROCEDURE TRAY) ×2 IMPLANT
KIT ROOM TURNOVER OR (KITS) ×2 IMPLANT
MANIFOLD NEPTUNE II (INSTRUMENTS) ×2 IMPLANT
NS IRRIG 1000ML POUR BTL (IV SOLUTION) ×2 IMPLANT
PACK TOTAL JOINT (CUSTOM PROCEDURE TRAY) ×2 IMPLANT
PAD ARMBOARD 7.5X6 YLW CONV (MISCELLANEOUS) ×4 IMPLANT
RTRCTR WOUND ALEXIS 18CM MED (MISCELLANEOUS)
SET HNDPC FAN SPRY TIP SCT (DISPOSABLE) ×1 IMPLANT
SPONGE LAP 18X18 X RAY DECT (DISPOSABLE) IMPLANT
SPONGE LAP 4X18 X RAY DECT (DISPOSABLE) IMPLANT
STAPLER VISISTAT 35W (STAPLE) IMPLANT
SUT ETHIBOND NAB CT1 #1 30IN (SUTURE) ×4 IMPLANT
SUT MNCRL AB 3-0 PS2 27 (SUTURE) ×2 IMPLANT
SUT VIC AB 0 CT1 27 (SUTURE) ×2
SUT VIC AB 0 CT1 27XBRD ANBCTR (SUTURE) ×1 IMPLANT
SUT VIC AB 1 CT1 27 (SUTURE) ×2
SUT VIC AB 1 CT1 27XBRD ANBCTR (SUTURE) ×1 IMPLANT
SUT VIC AB 2-0 CT1 27 (SUTURE) ×2
SUT VIC AB 2-0 CT1 TAPERPNT 27 (SUTURE) ×1 IMPLANT
TOWEL OR 17X24 6PK STRL BLUE (TOWEL DISPOSABLE) ×2 IMPLANT
TOWEL OR 17X26 10 PK STRL BLUE (TOWEL DISPOSABLE) ×2 IMPLANT
TRAY FOLEY CATH 16FRSI W/METER (SET/KITS/TRAYS/PACK) ×2 IMPLANT
WATER STERILE IRR 1000ML POUR (IV SOLUTION) ×4 IMPLANT

## 2012-09-14 NOTE — Brief Op Note (Signed)
09/14/2012  2:46 PM  PATIENT:  Zoe [name]  77 y.o. female  PRE-OPERATIVE DIAGNOSIS:  Right hip osteoarthritis, right knee pain  POST-OPERATIVE DIAGNOSIS:  right hip osteoarthritis, right knee pain  PROCEDURE:  Procedure(s): RIGHT TOTAL HIP ARTHROPLASTY ANTERIOR APPROACH and RIGHT KNEE STEROID INJECTION (Right)  SURGEON:  Surgeon(s) and Role:    * Kathryne Hitch, MD - Primary  PHYSICIAN ASSISTANT: Rexene Edison, PA-C  ANESTHESIA:   general  EBL:  Total I/O In: 2000 [I.V.:2000] Out: 800 [Urine:400; Blood:400]  BLOOD ADMINISTERED:none  DRAINS: none   LOCAL MEDICATIONS USED:  NONE  SPECIMEN:  No Specimen  DISPOSITION OF SPECIMEN:  N/A  COUNTS:  YES  TOURNIQUET:  * No tourniquets in log *  DICTATION: .Other Dictation: Dictation Number [id number]  PLAN OF CARE: Admit to inpatient   PATIENT DISPOSITION:  PACU - hemodynamically stable.   Delay start of Pharmacological VTE agent (>24hrs) due to surgical blood loss or risk of bleeding: no

## 2012-09-14 NOTE — Progress Notes (Signed)
Patient requesting quinine for leg cramps that she takes at home that was listed on her med rec.  Spoke with Dr. August Saucer on call for Dr. Magnus Ivan, ok to order quinine.  Spoke with pharmacy, medication is only indicated as an anti-malarial, and no longer for muscle cramps.  Will follow-up with Dr. Magnus Ivan in the am.

## 2012-09-14 NOTE — Preoperative (Signed)
Beta Blockers   Reason not to administer Beta Blockers:Not Applicable 

## 2012-09-14 NOTE — Anesthesia Postprocedure Evaluation (Signed)
Anesthesia Post Note  Patient: Zoe [name]  Procedure(s) Performed: Procedure(s) (LRB): RIGHT TOTAL HIP ARTHROPLASTY ANTERIOR APPROACH and RIGHT KNEE STEROID INJECTION (Right)  Anesthesia type: general  Patient location: PACU  Post pain: Pain level controlled  Post assessment: Patient's Cardiovascular Status Stable  Last Vitals:  Filed Vitals:   09/14/12 1700  BP:   Pulse: 56  Temp:   Resp: 13    Post vital signs: Reviewed and stable  Level of consciousness: sedated  Complications: No apparent anesthesia complications

## 2012-09-14 NOTE — Anesthesia Preprocedure Evaluation (Addendum)
Anesthesia Evaluation  Patient identified by MRN, date of birth, ID band  Reviewed: Allergy & Precautions, H&P , NPO status , Patient's Chart, lab work & pertinent test results  History of Anesthesia Complications Negative for: history of anesthetic complications  Airway Mallampati: II TM Distance: >3 FB Neck ROM: Full    Dental  (+) Dental Advisory Given   Pulmonary asthma , pneumonia -, Current Smoker,          Cardiovascular hypertension, Pt. on medications     Neuro/Psych PSYCHIATRIC DISORDERS Anxiety Depression negative neurological ROS     GI/Hepatic Neg liver ROS, GERD-  Medicated and Controlled,  Endo/Other  negative endocrine ROS  Renal/GU negative Renal ROS     Musculoskeletal  (+) Fibromyalgia -  Abdominal   Peds  Hematology   Anesthesia Other Findings   Reproductive/Obstetrics                          Anesthesia Physical Anesthesia Plan  ASA: III  Anesthesia Plan: General   Post-op Pain Management:    Induction: Intravenous  Airway Management Planned: Oral ETT  Additional Equipment:   Intra-op Plan:   Post-operative Plan:   Informed Consent: I have reviewed the patients History and Physical, chart, labs and discussed the procedure including the risks, benefits and alternatives for the proposed anesthesia with the patient or authorized representative who has indicated his/her understanding and acceptance.   Dental advisory given  Plan Discussed with: CRNA, Anesthesiologist and Surgeon  Anesthesia Plan Comments:        Anesthesia Quick Evaluation

## 2012-09-14 NOTE — Progress Notes (Signed)
Patient with order for .25mg  Digoxin to be given this pm.  Patient states "I have been taking it for 50 years."  Patient with BP of 108/49 and heart rate of 60 at 2258, patient hesitant to take medication because of .  No parameters to hold medication set, spoke with Dr. August Saucer on call for Dr. Magnus Ivan about patient's vitals and Digoxin dose, he advised that the patient should keep taking medication as she has been for such a long time.  Relayed this information to patient, decided ultimately she did not want to take medication.  Nursing will continue to monitor and follow up with Dr. Magnus Ivan in am.

## 2012-09-14 NOTE — H&P (Signed)
TOTAL HIP ADMISSION H&P  Patient is admitted for right total hip arthroplasty.  Subjective:  Chief Complaint: right hip pain  HPI: Zoe [name], 77 y.o. female, has a history of pain and functional disability in the right hip(s) due to arthritis and patient has failed non-surgical conservative treatments for greater than 12 weeks to include NSAID's and/or analgesics, flexibility and strengthening excercises, use of assistive devices and activity modification.  Onset of symptoms was gradual starting 4 years ago with gradually worsening course since that time.The patient noted no past surgery on the right hip(s).  Patient currently rates pain in the right hip at 10 out of 10 with activity. Patient has night pain, worsening of pain with activity and weight bearing, trendelenberg gait, pain that interfers with activities of daily living, pain with passive range of motion and crepitus. Patient has evidence of subchondral cysts, subchondral sclerosis, periarticular osteophytes and joint space narrowing by imaging studies. This condition presents safety issues increasing the risk of falls.  There is no current active infection.  Patient Active Problem List   Diagnosis Date Noted  . Arthritis pain of hip 09/14/2012  . Weakness of right leg 03/24/2011  . OA (osteoarthritis) 03/24/2011  . Stiffness of cervical spine 03/24/2011  . Chronic constipation 11/19/2010  . GERD (gastroesophageal reflux disease) 11/19/2010  . SPINAL STENOSIS 04/18/2009  . HIP PAIN 11/17/2007  . DEGEN LUMBAR/LUMBOSACRAL INTERVERTEBRAL DISC 11/17/2007  . ANSERINE BURSITIS, RIGHT 08/11/2007  . SCIATICA 06/22/2007  . KNEE PAIN 03/17/2007   Past Medical History  Diagnosis Date  . GERD (gastroesophageal reflux disease)   . Chronic constipation   . Cough   . Nausea   . Hemorrhoids   . High cholesterol   . Asthma   . Environmental allergies   . Urgency of urination   . Arthritis   . Hypercholesteremia   . Hypertension    . Dysrhythmia     tachycardia  . Anxiety   . Depression   . Pneumonia     7+ YRS  . Fibromyalgia   . Cancer     skin CA    Past Surgical History  Procedure Laterality Date  . Upper gastrointestinal endoscopy  08/03/2009    NUR  . Upper gastrointestinal endoscopy  03/29/2003    EGD ED TCS  . Colonoscopy  03/29/03  . Umbilical hernia repair  04/26/03    JENKINS  . Cholecystectomy    . Appendectomy    . Eye surgery      bilateral cataract removal  . Skin cancer excision      RIGHT NECK    . Tonsillectomy      T+A  . Back surgery      X2     Prescriptions prior to admission  Medication Sig Dispense Refill  . albuterol (PROVENTIL HFA;VENTOLIN HFA) 108 (90 BASE) MCG/ACT inhaler Inhale 2 puffs into the lungs every 6 (six) hours as needed for wheezing or shortness of breath.      . allopurinol (ZYLOPRIM) 300 MG tablet Take 300 mg by mouth daily.      Marland Kitchen ALPRAZolam (XANAX) 0.5 MG tablet Take 0.5 mg by mouth 3 (three) times daily as needed for anxiety.       Marland Kitchen amLODipine (NORVASC) 2.5 MG tablet Take 2.5 mg by mouth every morning.       Marland Kitchen aspirin EC 81 MG tablet Take 81 mg by mouth every morning.      . Biotin (BIOTIN 5000) 5 MG CAPS Take  1 capsule by mouth every morning.      . digoxin (LANOXIN) 0.25 MG tablet Take 250 mcg by mouth at bedtime.       Marland Kitchen doxepin (SINEQUAN) 100 MG capsule Take 100 mg by mouth at bedtime.       . gabapentin (NEURONTIN) 300 MG capsule Take 300 mg by mouth 2 (two) times daily.       . lansoprazole (PREVACID) 30 MG capsule Take by mouth daily.       . Multiple Vitamin (MULITIVITAMIN WITH MINERALS) TABS Take 1 tablet by mouth daily.      . naproxen (NAPROSYN) 500 MG tablet Take 500 mg by mouth 2 (two) times daily with a meal.      . pravastatin (PRAVACHOL) 20 MG tablet Take 10-20 mg by mouth every morning.       . psyllium (METAMUCIL) 58.6 % powder Take 1 packet by mouth 3 (three) times daily.      . quiNINE (QUALAQUIN) 324 MG capsule Take 648 mg by mouth  at bedtime as needed (leg cramps).      . senna (SENOKOT) 8.6 MG tablet Take 5 tablets by mouth 2 (two) times daily.       . traMADol (ULTRAM) 50 MG tablet Take 50-100 mg by mouth every 6 (six) hours as needed for pain.       Marland Kitchen zolpidem (AMBIEN) 10 MG tablet Take 10 mg by mouth at bedtime as needed for sleep.       Allergies  Allergen Reactions  . Codeine Other (See Comments)    Pains in abdominal area  . Other     NOVACAINE     ITCHING    History  Substance Use Topics  . Smoking status: Current Some Day Smoker -- 0.25 packs/day for 50 years    Types: Cigarettes  . Smokeless tobacco: Never Used     Comment: Patient states that she may smoke when others smoke, this is seldom  . Alcohol Use: Yes     Comment: social    Family History  Problem Relation Age of Onset  . Healthy Son   . Healthy Son   . Healthy Daughter      Review of Systems  Musculoskeletal: Positive for joint pain and falls.  All other systems reviewed and are negative.    Objective:  Physical Exam  Constitutional: She is oriented to person, place, and time. She appears well-developed and well-nourished.  HENT:  Head: Normocephalic and atraumatic.  Eyes: EOM are normal. Pupils are equal, round, and reactive to light.  Neck: Normal range of motion. Neck supple.  Cardiovascular: Normal rate and regular rhythm.   Respiratory: Effort normal and breath sounds normal.  GI: Soft. Bowel sounds are normal.  Musculoskeletal:       Right hip: She exhibits decreased range of motion, decreased strength and bony tenderness.       Right knee: She exhibits swelling. Tenderness found. Lateral joint line and patellar tendon tenderness noted.  Neurological: She is alert and oriented to person, place, and time.  Skin: Skin is warm and dry.  Psychiatric: She has a normal mood and affect.    Vital signs in last 24 hours: Temp:  [97.1 F (36.2 C)] 97.1 F (36.2 C) (08/19 1046) Pulse Rate:  [59] 59 (08/19 1046) Resp:   [20] 20 (08/19 1046) BP: (160)/(65) 160/65 mmHg (08/19 1046) SpO2:  [98 %] 98 % (08/19 1046)  Labs:   Estimated body mass index is 28.15  kg/(m^2) as calculated from the following:   Height as of 09/07/12: 5' 7.5" (1.715 m).   Weight as of 03/16/12: 82.8 kg (182 lb 8.7 oz).   Imaging Review Plain radiographs demonstrate severe degenerative joint disease of the right hip(s). The bone quality appears to be good for age and reported activity level.  Assessment/Plan:  End stage arthritis, right hip(s)  The patient history, physical examination, clinical judgement of the provider and imaging studies are consistent with end stage degenerative joint disease of the right hip(s) and total hip arthroplasty is deemed medically necessary. The treatment options including medical management, injection therapy, arthroscopy and arthroplasty were discussed at length. The risks and benefits of total hip arthroplasty were presented and reviewed. The risks due to aseptic loosening, infection, stiffness, dislocation/subluxation,  thromboembolic complications and other imponderables were discussed.  The patient acknowledged the explanation, agreed to proceed with the plan and consent was signed. Patient is being admitted for inpatient treatment for surgery, pain control, PT, OT, prophylactic antibiotics, VTE prophylaxis, progressive ambulation and ADL's and discharge planning.The patient is planning to be discharged to skilled nursing facility

## 2012-09-14 NOTE — Anesthesia Procedure Notes (Signed)
Procedure Name: Intubation Date/Time: 09/14/2012 12:43 PM Performed by: Rogelia Boga Pre-anesthesia Checklist: Patient identified, Emergency Drugs available, Patient being monitored, Suction available and Timeout performed Patient Re-evaluated:Patient Re-evaluated prior to inductionOxygen Delivery Method: Circle system utilized Preoxygenation: Pre-oxygenation with 100% oxygen Intubation Type: IV induction Ventilation: Mask ventilation without difficulty and Oral airway inserted - appropriate to patient size Laryngoscope Size: Mac and 4 Grade View: Grade II Tube type: Oral Tube size: 7.5 mm Number of attempts: 1 Airway Equipment and Method: Stylet Secured at: 21 cm Tube secured with: Tape Dental Injury: Teeth and Oropharynx as per pre-operative assessment

## 2012-09-14 NOTE — Transfer of Care (Signed)
Immediate Anesthesia Transfer of Care Note  Patient: Zoe [name]  Procedure(s) Performed: Procedure(s): RIGHT TOTAL HIP ARTHROPLASTY ANTERIOR APPROACH and RIGHT KNEE STEROID INJECTION (Right)  Patient Location: PACU  Anesthesia Type:General  Level of Consciousness: awake, alert , oriented and patient cooperative  Airway & Oxygen Therapy: Patient Spontanous Breathing and Patient connected to nasal cannula oxygen  Post-op Assessment: Report given to PACU RN, Post -op Vital signs reviewed and stable and Patient moving all extremities X 4  Post vital signs: Reviewed and stable  Complications: No apparent anesthesia complications

## 2012-09-14 NOTE — Progress Notes (Signed)
Orthopedic Tech Progress Note Patient Details:  [facility] [birth year] [phone] Applied overhead frame to bed     Jennye Moccasin 09/14/2012, 8:18 PM

## 2012-09-15 ENCOUNTER — Encounter (HOSPITAL_COMMUNITY): Payer: Self-pay | Admitting: Orthopaedic Surgery

## 2012-09-15 LAB — BASIC METABOLIC PANEL
BUN: 9 mg/dL (ref 6–23)
CO2: 30 mEq/L (ref 19–32)
Calcium: 8.5 mg/dL (ref 8.4–10.5)
Chloride: 102 mEq/L (ref 96–112)
Creatinine, Ser: 0.52 mg/dL (ref 0.50–1.10)
GFR calc Af Amer: >90 mL/min (ref >90)
GFR calc non Af Amer: >90 mL/min (ref >90)
Glucose, Bld: 136 mg/dL — ABNORMAL HIGH (ref 70–99)
Potassium: 4.1 mEq/L (ref 3.5–5.1)
Sodium: 140 mEq/L (ref 135–145)

## 2012-09-15 LAB — CBC
HCT: 29.1 % — ABNORMAL LOW (ref 36.0–46.0)
Hemoglobin: 10.1 g/dL — ABNORMAL LOW (ref 12.0–15.0)
MCH: 33.3 pg (ref 26.0–34.0)
MCHC: 34.7 g/dL (ref 30.0–36.0)
MCV: 96 fL (ref 78.0–100.0)
Platelets: 156 10*3/uL (ref 150–400)
RBC: 3.03 MIL/uL — ABNORMAL LOW (ref 3.87–5.11)
RDW: 13.9 % (ref 11.5–15.5)
WBC: 10.5 10*3/uL (ref 4.0–10.5)

## 2012-09-15 MED ORDER — FLEET ENEMA 7-19 GM/118ML RE ENEM
1.0000 | ENEMA | Freq: Every day | RECTAL | Status: DC | PRN
  Administered 2012-09-16: 1 via RECTAL
  Filled 2012-09-15: qty 1

## 2012-09-15 MED ORDER — BENZOCAINE 10 % MT GEL
Freq: Three times a day (TID) | OROMUCOSAL | Status: DC | PRN
  Administered 2012-09-15: 19:00:00 via OROMUCOSAL
  Filled 2012-09-15 (×2): qty 9.4

## 2012-09-15 MED ORDER — CHLORHEXIDINE GLUCONATE CLOTH 2 % EX PADS
6.0000 | MEDICATED_PAD | Freq: Every day | CUTANEOUS | Status: DC
  Administered 2012-09-15 – 2012-09-16 (×2): 6 via TOPICAL

## 2012-09-15 MED ORDER — MAGIC MOUTHWASH
5.0000 mL | Freq: Three times a day (TID) | ORAL | Status: DC | PRN
  Filled 2012-09-15 (×2): qty 5

## 2012-09-15 NOTE — Progress Notes (Signed)
Subjective: 1 Day Post-Op Procedure(s) (LRB): RIGHT TOTAL HIP ARTHROPLASTY ANTERIOR APPROACH and RIGHT KNEE STEROID INJECTION (Right) Patient reports pain as moderate.  Has had difficulty with BM even prior to surgery.  Foley out.  Acute blood loss anemia.  Objective: Vital signs in last 24 hours: Temp:  [96.7 F (35.9 C)-98.8 F (37.1 C)] 98.3 F (36.8 C) (08/20 0600) Pulse Rate:  [51-67] 62 (08/20 0600) Resp:  [11-27] 18 (08/20 0600) BP: (93-163)/(37-90) 104/43 mmHg (08/20 0600) SpO2:  [95 %-100 %] 99 % (08/20 0600) FiO2 (%):  [28 %] 28 % (08/19 1935) Weight:  [79.379 kg (175 lb)] 79.379 kg (175 lb) (08/20 0240)  Intake/Output from previous day: 08/19 0701 - 08/20 0700 In: 3300 [P.O.:300; I.V.:3000] Out: 3800 [Urine:3400; Blood:400] Intake/Output this shift:     Recent Labs  09/15/12 0635  HGB 10.1*    Recent Labs  09/15/12 0635  WBC 10.5  RBC 3.03*  HCT 29.1*  PLT 156   No results found for this basename: NA, K, CL, CO2, BUN, CREATININE, GLUCOSE, CALCIUM,  in the last 72 hours No results found for this basename: LABPT, INR,  in the last 72 hours  Sensation intact distally Intact pulses distally Dorsiflexion/Plantar flexion intact Incision: dressing C/D/I  Assessment/Plan: 1 Day Post-Op Procedure(s) (LRB): RIGHT TOTAL HIP ARTHROPLASTY ANTERIOR APPROACH and RIGHT KNEE STEROID INJECTION (Right) Up with therapy  Tedric Leeth Y 09/15/2012, 7:44 AM

## 2012-09-15 NOTE — Evaluation (Signed)
Occupational Therapy Evaluation Patient Details Name: Zoe [name] MRN: [id number] DOB: 1935-04-17 Today's Date: 09/15/2012 Time: 6578-4696 OT Time Calculation (min): 30 min  OT Assessment / Plan / Recommendation History of present illness s/p elective Rt THA    Clinical Impression   Pt presents with below problem list. Pt requiring Min A for ADLs, PTA. Pt will benefit from acute OT to increase independence prior to d/c. Recommending SNF at this time due to pt not having 24/7 assist available.     OT Assessment  Patient needs continued OT Services    Follow Up Recommendations  SNF;Supervision/Assistance - 24 hour    Barriers to Discharge Decreased caregiver support    Equipment Recommendations  3 in 1 bedside comode    Recommendations for Other Services    Frequency  Min 2X/week    Precautions / Restrictions Precautions Precautions: Anterior Hip;Fall Precaution Booklet Issued: No Restrictions Weight Bearing Restrictions: Yes RLE Weight Bearing: Weight bearing as tolerated   Pertinent Vitals/Pain Pain in stomach, hip, knee, and buttocks,but not rated. Nurse notified.     ADL  Eating/Feeding: Independent Where Assessed - Eating/Feeding: Chair Grooming: Set up;Supervision/safety Where Assessed - Grooming: Unsupported sitting Upper Body Bathing: Set up;Supervision/safety Where Assessed - Upper Body Bathing: Unsupported sitting Lower Body Bathing: Maximal assistance Where Assessed - Lower Body Bathing: Supported sit to stand Upper Body Dressing: Set up;Supervision/safety Where Assessed - Upper Body Dressing: Unsupported sitting Lower Body Dressing: Maximal assistance Where Assessed - Lower Body Dressing: Supported sit to stand Toilet Transfer: Simulated;Moderate assistance Toilet Transfer Method: Sit to stand Toilet Transfer Equipment: Other (comment) (from bed) Toileting - Clothing Manipulation and Hygiene: Minimal assistance Where Assessed - Toileting Clothing  Manipulation and Hygiene: Standing Tub/Shower Transfer Method: Not assessed Equipment Used: Gait belt;Rolling walker Transfers/Ambulation Related to ADLs: Min guard for ambulation-cues for technique. Mod A for sit to stand from bed and Min A for stand to sit to recliner chair. ADL Comments: Pt unable to reach either foot. Pt uses sockaid at home.  Overall Max A for LB ADLs.  OT educated to stand in front of bed or chair to pull up pants/underwear for safety. OT also educated on leaning or pushing up in chair for pressure relief.    OT Diagnosis: Acute pain  OT Problem List: Decreased range of motion;Decreased activity tolerance;Impaired balance (sitting and/or standing);Decreased knowledge of use of DME or AE;Pain;Decreased strength OT Treatment Interventions: Self-care/ADL training;DME and/or AE instruction;Therapeutic activities;Patient/family education;Balance training   OT Goals(Current goals can be found in the care plan section) Acute Rehab OT Goals Patient Stated Goal: to go to the bathroom OT Goal Formulation: With patient Time For Goal Achievement: 09/22/12 Potential to Achieve Goals: Good ADL Goals Pt Will Perform Lower Body Bathing: with adaptive equipment;sit to/from stand;with supervision Pt Will Perform Lower Body Dressing: with supervision;with adaptive equipment;sit to/from stand Pt Will Transfer to Toilet: with supervision;ambulating (3 in 1 over commode) Pt Will Perform Toileting - Clothing Manipulation and hygiene: with supervision;sit to/from stand;sitting/lateral leans Pt Will Perform Tub/Shower Transfer: Tub transfer;with min guard assist;ambulating (tub equipment tbd)  Visit Information  Last OT Received On: 09/15/12 Assistance Needed: +1 History of Present Illness: s/p elective Rt THA        Prior Functioning     Home Living Family/patient expects to be discharged to:: Private residence Living Arrangements: Spouse/significant other Available Help at  Discharge: Family;Available PRN/intermittently Type of Home: House Home Access: Stairs to enter [employer] of Steps: 3 Entrance Stairs-Rails: None Home  Layout: One level Home Equipment: Shower seat;Adaptive equipment Adaptive Equipment: Long-handled shoe horn;Sock aid;Long-handled sponge Additional Comments: pt states she has fallen 3 times in 2 months  Prior Function Level of Independence: Needs assistance Gait / Transfers Assistance Needed: amb with rollator  ADL's / Homemaking Assistance Needed: husband would help her tie shoes; has tub shower and shower seat; states shower seat was too difficult getting in and out; was able to shower independently with increased time  Communication Communication: No difficulties Dominant Hand: Left         Vision/Perception     Cognition  Cognition Arousal/Alertness: Awake/alert Behavior During Therapy: Anxious Overall Cognitive Status: Within Functional Limits for tasks assessed    Extremity/Trunk Assessment Upper Extremity Assessment Upper Extremity Assessment: Overall WFL for tasks assessed     Mobility Bed Mobility Bed Mobility: Supine to Sit;Sitting - Scoot to Edge of Bed Supine to Sit: 4: Min guard;With rails;HOB elevated Sitting - Scoot to Edge of Bed: 4: Min guard Details for Bed Mobility Assistance: Min guard for safety. Pt taking increased time. Cues for technique. Transfers Transfers: Sit to Stand;Stand to Sit Sit to Stand: 3: Mod assist;With upper extremity assist;From bed Stand to Sit: 4: Min assist;With upper extremity assist;To chair/3-in-1 Details for Transfer Assistance: Mod A to stand from bed. Cues for hand placement. Pt taking increased time for sit to stand transfer.      Exercise     Balance     End of Session OT - End of Session Equipment Utilized During Treatment: Gait belt;Rolling walker Activity Tolerance: Patient tolerated treatment well Patient left: in chair;with call bell/phone within  reach;with family/visitor present Nurse Communication: Mobility status  GO     Earlie Raveling OTR/L [phone] 09/15/2012, 5:31 PM

## 2012-09-15 NOTE — Progress Notes (Signed)
UR COMPLETED  

## 2012-09-15 NOTE — Op Note (Signed)
NAMEJASIMINE, Zoe [name] NO.:  [id number]  MEDICAL RECORD NO.:  [id number]  LOCATION:  [id number]                        FACILITY:  [facility]  PHYSICIAN:  Vanita Panda. Magnus Ivan, M.D.DATE OF BIRTH:  July 15, 1935  DATE OF PROCEDURE:  09/14/2012 DATE OF DISCHARGE:                              OPERATIVE REPORT   PREOPERATIVE DIAGNOSES:  Severe end-stage arthritis and degenerative joint disease, right hip.  POSTOPERATIVE DIAGNOSES:  Severe end-stage arthritis and degenerative joint disease, right hip.  PROCEDURE:  Right total hip arthroplasty direct anterior approach.  IMPLANTS:  DePuy Sector Gription acetabular component size 52, size 36+ 4 neutral polyethylene liner, size 14 Corail femoral component with standard offset, size 36- 2 metal hip ball.  SURGEON:  Vanita Panda. Magnus Ivan, M.D.  ASSISTANT:  Richardean Canal, PA-C.  ANESTHESIA:  General.  ANTIBIOTICS:  2 g IV Ancef.  ESTIMATED BLOOD LOSS:  400 mL.  COMPLICATIONS:  None.  INDICATIONS:  Zoe [name] is a 77 year old female with debilitating arthritis involving both of her hips.  At this point, she has failed conservative treatments.  Her x-rays show complete loss of the joint space, severe sclerosis of the femoral head and acetabulum and periarticular osteophytes.  Her quality of life is greatly diminished. Her pain is daily and her mobility is limited.  At this point, it has been recommend she undergo a total hip arthroplasty.  She does wish to proceed with this.  She understands the risks of acute blood loss anemia, nerve and vessel injury, fracture, infection, and DVT.  DESCRIPTION OF PROCEDURE:  After informed consent was obtained, appropriate right hip was marked.  She was brought to the operating room.  General anesthesia was obtained while she was on her stretcher. Her rings were removed.  The Foley catheter was placed and then both feet had traction boots applied to them.  She was placed supine on  the Hana fracture table with the perineal post in place and both legs in inline skeletal traction, but no traction applied.  Time-out was called. The right hip was then assessed under direct fluoroscopy.  It was prepped and draped with DuraPrep and sterile drapes in a fluoroscopic unit was also draped with sterile drapes.  Time-out was called and she was identified as correct patient, correct right hip.  I then made an incision inferior and posterior to the anterior-superior iliac spine and carried this obliquely down the leg.  I dissected down to the tensor fascia lata muscle and the tensor fascia was divided longitudinally.  I then proceeded with a direct anterior approach to the hip.  A Cobra retractor was placed around the lateral neck and up underneath the rectus femoris.  A Cobra retractor was placed around the medial neck.  I cauterized the lateral femoral circumflex vessels.  I made my femoral neck cut with an oscillating saw proximal to the lesser trochanter and completed this on osteotome.  I then placed a corkscrew guide in the femoral head and removed the femoral head in its entirety and found it devoid of cartilage.  I cleaned the acetabulum debris and remnants of the labrum.  I placed a Bent Hohmann in the medial acetabulum and a Cobra retractor laterally.  I began reaming in a 2 mm increments from a size 42 all the way up to a size 52 with all reamers placed under direct visualization.  The last reamer also under direct fluoroscopy, study obtained our depth of reaming, our inclination, and anteversion.  I then placed the real DePuy Sector Gription acetabular component size 52 with an apex hole eliminator guide and the real 36+ 4 neutral polyethylene liner.  Attention was then turned to the femur with the leg externally rotated to 90 degrees, extended and adducted.  I was able to place a Mueller retractor medially and a Hohmann retractor behind the greater trochanter.  I  released the lateral joint capsule and used a rongeur to lateralize, and a box cutting osteotome entering into the femoral canal. I then began broaching from a size 8 broach all the way to a size 14 broach because of her type A bone.  I then trialed a 36+ 1.5 hip ball and brought the leg back over and up with traction and internal rotation reduced in the pelvis and it was stable, but she was just a little bit long, so we dislocated the hip and trialed a 36- 2 hip ball.  We reduced this in the acetabulum and it was stable.  I did improve her leg lengths.  We then dislocated the hip and removed the trial components. We placed the real size 14 Corail femoral component with standard offset and the real 36- 2 metal hip ball reduced this back in the acetabulum and it was stable.  We then copiously irrigated the joint with normal saline solution using pulsatile lavage and closed the joint capsule with interrupted #1 Ethibond suture.  We closed the tensor fascia with a running #1 Vicryl, followed by 0 Vicryl in the deep tissue, 2-0 Vicryl in subcutaneous tissue, 4-0 Monocryl subcuticular stitch and Dermabond on the skin.  An Aquacel dressing was applied.  She was taken off the Hana table, awakened, extubated, and taken to recovery room in stable condition.  All final counts were correct.  No complications noted.  Of note, Richardean Canal, PA-C was present the entire case and his presence was crucial for helping to expose the hip, retracting, and then closure of the wound.     Vanita Panda. Magnus Ivan, M.D.     CYB/MEDQ  D:  09/14/2012  T:  09/15/2012  Job:  [id number]

## 2012-09-15 NOTE — Evaluation (Signed)
Physical Therapy Evaluation Patient Details Name: Zoe [name] MRN: [id number] DOB: May 01, 1935 Today's Date: 09/15/2012 Time: 9562-1308 PT Time Calculation (min): 33 min  PT Assessment / Plan / Recommendation History of Present Illness  s/p elective Rt THA   Clinical Impression  Pt is s/p anterior Rt THA POD#1 resulting in the deficits listed below (see PT Problem List). Pt will benefit from skilled PT to increase their independence and safety with mobility to allow discharge to the venue listed below. Pt is very anxious with activity and requires max encouragement and cues. Pt very concerned about D/C disposition. Will not have 24/7 (A) at home and is inquiring about rehab at [facility]. Pt is a fall risk and has fallen 3 times in past 2 months. BP sitting EOB 106/36; in standing 151/135 and with amb 134/64; no c/o dizziness or lightheadedness.     PT Assessment  Patient needs continued PT services    Follow Up Recommendations  SNF;Supervision/Assistance - 24 hour    Does the patient have the potential to tolerate intense rehabilitation      Barriers to Discharge Decreased caregiver support;Inaccessible home environment pt has steps without rails; pt will be home alone during the day     Equipment Recommendations  Other (comment) (TBD )    Recommendations for Other Services OT consult   Frequency 7X/week    Precautions / Restrictions Precautions Precautions: Anterior Hip;Fall Precaution Booklet Issued: Yes (comment) Precaution Comments: pt given THA HEP protocol Restrictions Weight Bearing Restrictions: Yes RLE Weight Bearing: Weight bearing as tolerated   Pertinent Vitals/Pain 8/10 "muscle cramps mainly"; see assessment for BP       Mobility  Bed Mobility Bed Mobility: Supine to Sit Supine to Sit: 3: Mod assist;HOB flat;With rails Details for Bed Mobility Assistance: pt requires increased time and max cues for hand placement in sequencing; pt very anxious when  advancing Rt LE; requries max encouragement and (A) to advance Rt LE and (A) to bring trunk to upright sitting position on EOB  Transfers Transfers: Sit to Stand;Stand to Sit Sit to Stand: 3: Mod assist;From bed;With upper extremity assist;From elevated surface Stand to Sit: 4: Min assist;To chair/3-in-1;With armrests;With upper extremity assist Details for Transfer Assistance: increased (A) to achieve upright standing position; max cues for hand placement and safety with RW; pt requires max encouragement; pt becomes very anxious sitting on EOB and takes increased time to perform transfers  Ambulation/Gait Ambulation/Gait Assistance: 4: Min assist Ambulation Distance (Feet): 6 Feet Assistive device: Rolling walker Ambulation/Gait Assistance Details: cues for gt sequencing and RW sequencing; pt requires (A) to manage RW; amb with very wide BOS, trunk flex and shuffled gt; pt with difficulty advancing Rt LE; max cues and facilitation to weightshift and advance Rt LE  Gait Pattern: Step-to pattern;Decreased stance time - right;Decreased step length - left;Decreased hip/knee flexion - left;Decreased hip/knee flexion - right;Decreased stride length;Wide base of support;Trunk flexed Gait velocity: very decreased Stairs: No Wheelchair Mobility Wheelchair Mobility: No    Exercises Total Joint Exercises Ankle Circles/Pumps: AROM;10 reps;Both;Supine Long Arc Quad: AROM;Right;10 reps;Seated;Other (comment) (c/o Pain)   PT Diagnosis: Difficulty walking;Acute pain;Generalized weakness  PT Problem List: Decreased strength;Decreased range of motion;Decreased activity tolerance;Decreased balance;Decreased mobility;Decreased knowledge of use of DME;Decreased safety awareness;Pain PT Treatment Interventions: DME instruction;Gait training;Stair training;Therapeutic activities;Therapeutic exercise;Functional mobility training;Balance training;Neuromuscular re-education;Patient/family education     PT  Goals(Current goals can be found in the care plan section) Acute Rehab PT Goals Patient Stated Goal: "id like to go home  but i think rehab might be better" PT Goal Formulation: With patient Time For Goal Achievement: 09/22/12 Potential to Achieve Goals: Good  Visit Information  Last PT Received On: 09/15/12 Assistance Needed: +2 (for safety; pt very anxious) History of Present Illness: s/p elective Rt THA        Prior Functioning  Home Living Family/patient expects to be discharged to:: Private residence Living Arrangements: Spouse/significant other Available Help at Discharge: Family;Available PRN/intermittently Type of Home: House Home Access: Stairs to enter [employer] of Steps: 3 Entrance Stairs-Rails: None Home Layout: One level Home Equipment: Walker - 4 wheels;Shower seat;Grab bars - tub/shower Additional Comments: pt states she has fallen 3 times in 2 months  Prior Function Level of Independence: Needs assistance Gait / Transfers Assistance Needed: amb with rollator  ADL's / Homemaking Assistance Needed: husband would help her tie shoes; has tub shower and shower seat; states shower seat was too difficult getting in and out; was able to shower independently with increased time  Communication Communication: No difficulties Dominant Hand: Left    Cognition  Cognition Arousal/Alertness: Awake/alert Behavior During Therapy: Anxious Overall Cognitive Status: Within Functional Limits for tasks assessed    Extremity/Trunk Assessment Upper Extremity Assessment Upper Extremity Assessment: Defer to OT evaluation Lower Extremity Assessment Lower Extremity Assessment: RLE deficits/detail RLE: Unable to fully assess due to pain RLE Sensation:  (WFL to light touch) Cervical / Trunk Assessment Cervical / Trunk Assessment: Kyphotic   Balance Balance Balance Assessed: Yes Static Sitting Balance Static Sitting - Balance Support: Bilateral upper extremity  supported;Feet supported Static Sitting - Level of Assistance: 5: Stand by assistance Static Sitting - Comment/# of Minutes: pt tolerated sitting EOB; instructed on deep breathing exercises to decrease anxiety and monitor BP; see assessment for vitals  End of Session PT - End of Session Equipment Utilized During Treatment: Gait belt Activity Tolerance: Patient limited by pain;Other (comment) (anxiety) Patient left: in chair;with call bell/phone within reach;with family/visitor present Nurse Communication: Mobility status  GP     Donell Sievert, Terrell [phone] 09/15/2012, 10:27 AM

## 2012-09-16 LAB — CBC
HCT: 28.8 % — ABNORMAL LOW (ref 36.0–46.0)
Hemoglobin: 9.8 g/dL — ABNORMAL LOW (ref 12.0–15.0)
MCH: 33.1 pg (ref 26.0–34.0)
MCHC: 34 g/dL (ref 30.0–36.0)
MCV: 97.3 fL (ref 78.0–100.0)
Platelets: 146 10*3/uL — ABNORMAL LOW (ref 150–400)
RBC: 2.96 MIL/uL — ABNORMAL LOW (ref 3.87–5.11)
RDW: 14.3 % (ref 11.5–15.5)
WBC: 11.7 10*3/uL — ABNORMAL HIGH (ref 4.0–10.5)

## 2012-09-16 MED ORDER — MAGNESIUM CITRATE PO SOLN
1.0000 | Freq: Once | ORAL | Status: DC
  Filled 2012-09-16: qty 296

## 2012-09-16 MED ORDER — HYDROCODONE-ACETAMINOPHEN 5-325 MG PO TABS
1.0000 | ORAL_TABLET | ORAL | Status: DC | PRN
  Administered 2012-09-16 (×2): 1 via ORAL
  Administered 2012-09-17 (×2): 2 via ORAL
  Filled 2012-09-16: qty 1
  Filled 2012-09-16: qty 2
  Filled 2012-09-16: qty 1
  Filled 2012-09-16: qty 2

## 2012-09-16 MED ORDER — MAGNESIUM CITRATE PO SOLN
1.0000 | Freq: Every day | ORAL | Status: DC | PRN
  Administered 2012-09-17: 1 via ORAL
  Filled 2012-09-16 (×2): qty 296

## 2012-09-16 MED ORDER — BETHANECHOL CHLORIDE 5 MG PO TABS
5.0000 mg | ORAL_TABLET | Freq: Three times a day (TID) | ORAL | Status: DC
  Administered 2012-09-16 – 2012-09-17 (×3): 5 mg via ORAL
  Filled 2012-09-16 (×6): qty 1

## 2012-09-16 MED ORDER — CHLORHEXIDINE GLUCONATE CLOTH 2 % EX PADS: 6.0000 | MEDICATED_PAD | Freq: Every day | CUTANEOUS | Status: DC

## 2012-09-16 MED ORDER — MUPIROCIN 2 % EX OINT
TOPICAL_OINTMENT | Freq: Two times a day (BID) | CUTANEOUS | Status: DC
  Administered 2012-09-16 – 2012-09-17 (×2): via NASAL
  Filled 2012-09-16: qty 22

## 2012-09-16 NOTE — Clinical Social Work Placement (Addendum)
Clinical Social Work Department  CLINICAL SOCIAL WORK PLACEMENT NOTE  09/16/2012 Patient: Zoe [name]  Account Number: [id number] Admit date: 09/14/12 Clinical Social Worker: Sabino Niemann MSW Date/time: 09/16/2012 11:30 AM  Clinical Social Work is seeking post-discharge placement for this patient at the following level of care: SKILLED NURSING (*CSW will update this form in Epic as items are completed)  09/16/2012 Patient/family provided with [facility] Department of Clinical Social Work's list of facilities offering this level of care within the geographic area requested by the patient (or if unable, by the patient's family).  09/16/2012 Patient/family informed of their freedom to choose among providers that offer the needed level of care, that participate in Medicare, Medicaid or managed care program needed by the patient, have an available bed and are willing to accept the patient.  09/16/2012 Patient/family informed of [facility]' ownership interest in [facility], as well as of the fact that they are under no obligation to receive care at this facility.  PASARR submitted to EDS on 09/17/2012 PASARR number received from EDS on 09/17/2012 FL2 transmitted to all facilities in geographic area requested by pt/family on 09/16/2012 FL2 transmitted to all facilities within larger geographic area on  Patient informed that his/her managed care company has contracts with or will negotiate with certain facilities, including the following:  Patient/family informed of bed offers received: 09/17/2012 Patient chooses bed at  Physician recommends and patient chooses bed at  Patient to be transferred to on 09/17/2012 Patient to be transferred to facility by Private Vehicle The following physician request were entered in Epic:  Additional Comments:   Signing off Sabino Niemann, MSW, [phone]

## 2012-09-16 NOTE — Progress Notes (Signed)
Patient complaining of abdominal cramping overnight along with nausea.  Patient has not had a bowel movement since last week and states she has not been going regularly as of recently.  Patient is passing gas, bowel sounds audible in all 4 quadrants.  PRN Reglan given. Patient also given prune juice, and sorbitol and Miralax earlier in the day, and colace is scheduled.  Patient with history of chronic constipation, and takes senokot at home.  Spoke with Dr. Otelia Sergeant on call for Dr. Magnus Ivan, order for fleets enema daily PRN.  Patient declined enema, stating she would would take it at a later time.  Patient still passing gas, bowel sounds remain audible.  Patient states her abdomen feels better after I+O cath, stomach appears less distended.  Nursing will continue to monitor.

## 2012-09-16 NOTE — Progress Notes (Signed)
Patient with complaints of pain local to the right elbow area late this am.  She does not know how this happened.  Pain does not radiate.  Patient without chest pain or shortness of breath, no nausea or vomiting. Patient able to squeeze my hand, wiggle and feel fingers, capillary refill is less than 3 seconds.  Neuro check completed, and vital signs taken, all within normal limits.  PRN Xanax given for anxiety with positive effect.  Ice applied for comfort, patient declining Tramadol and Tylenol.  Will continue to monitor.

## 2012-09-16 NOTE — Progress Notes (Signed)
Physical Therapy Treatment Patient Details Name: Zoe [name] MRN: [id number] DOB: 04-Jun-1935 Today's Date: 09/16/2012 Time: 4098-1191 PT Time Calculation (min): 25 min  PT Assessment / Plan / Recommendation  History of Present Illness s/p elective Rt THA    PT Comments   Progressing a little better today but still anxious about mobility. Continue to recommmend SNF at this time unless patient show better gains with mobility  Follow Up Recommendations  SNF;Supervision/Assistance - 24 hour     Does the patient have the potential to tolerate intense rehabilitation     Barriers to Discharge        Equipment Recommendations       Recommendations for Other Services    Frequency 7X/week   Progress towards PT Goals Progress towards PT goals: Progressing toward goals  Plan Current plan remains appropriate    Precautions / Restrictions Precautions Precautions: None Restrictions RLE Weight Bearing: Weight bearing as tolerated   Pertinent Vitals/Pain     Mobility  Bed Mobility Supine to Sit: With rails;HOB elevated;4: Min assist Details for Bed Mobility Assistance: Min guard for safety. Pt taking increased time. Cues for technique. A for LEs Transfers Sit to Stand: 3: Mod assist;With upper extremity assist;From bed Stand to Sit: 4: Min assist;With upper extremity assist;To chair/3-in-1 Details for Transfer Assistance: Mod A to stand from bed. Cues for hand placement. Pt taking increased time for sit to stand transfer.  Ambulation/Gait Ambulation/Gait Assistance: 4: Min assist Ambulation Distance (Feet): 15 Feet Assistive device: Rolling walker Ambulation/Gait Assistance Details: Cues for gait sequence and posture. A for RW management and balance Gait Pattern: Step-to pattern;Decreased stance time - right;Decreased step length - left;Decreased hip/knee flexion - left;Decreased hip/knee flexion - right;Decreased stride length;Wide base of support;Trunk flexed Gait velocity:  very decreased    Exercises Total Joint Exercises Heel Slides: AAROM;Right;10 reps Hip ABduction/ADduction: AAROM;Right;10 reps   PT Diagnosis:    PT Problem List:   PT Treatment Interventions:     PT Goals (current goals can now be found in the care plan section)    Visit Information  Last PT Received On: 09/16/12 Assistance Needed: +1 History of Present Illness: s/p elective Rt THA     Subjective Data      Cognition  Cognition Arousal/Alertness: Awake/alert Behavior During Therapy: WFL for tasks assessed/performed Overall Cognitive Status: Within Functional Limits for tasks assessed    Balance     End of Session PT - End of Session Equipment Utilized During Treatment: Gait belt Activity Tolerance: Patient limited by pain;Other (comment) Patient left: in chair;with call bell/phone within reach;with family/visitor present   GP     Robinette, Adline Potter 09/16/2012, 11:56 AM 09/16/2012 Fredrich Birks PTA [phone] pager [phone] office

## 2012-09-16 NOTE — Progress Notes (Signed)
Patient without ability to spontaneously void overnight.  Bladder scan performed at 2100, showed 579 ml in bladder.  I+O cath performed, expressing 700 ml from bladder.  Patient still without spontaneous void throughout night.  Bladder scanned again about  0415 am, showed 503 ml in bladder.  Patient up to [facility], no void still, fluids encouraged.  Foley cath re-inserted at 0515 per foley cath protocol procedure.  of amber urine drained from bladder.

## 2012-09-16 NOTE — Progress Notes (Signed)
Subjective: 2 Days Post-Op Procedure(s) (LRB): RIGHT TOTAL HIP ARTHROPLASTY ANTERIOR APPROACH and RIGHT KNEE STEROID INJECTION (Right) Patient reports pain as moderate.  Wanting to change pain med to [employer]. Feels she will need to go to SNF.  Objective: Vital signs in last 24 hours: Temp:  [98.1 F (36.7 C)-99.6 F (37.6 C)] 99.6 F (37.6 C) (08/21 0531) Pulse Rate:  [72-96] 96 (08/21 0531) Resp:  [18] 18 (08/21 0531) BP: (115-135)/(55-72) 115/67 mmHg (08/21 0531) SpO2:  [93 %-100 %] 93 % (08/21 0531)  Intake/Output from previous day: 08/20 0701 - 08/21 0700 In: 795 [P.O.:120; I.V.:675] Out: 1850 [Urine:1850] Intake/Output this shift:     Recent Labs  09/15/12 0635 09/16/12 0500  HGB 10.1* 9.8*    Recent Labs  09/15/12 0635 09/16/12 0500  WBC 10.5 11.7*  RBC 3.03* 2.96*  HCT 29.1* 28.8*  PLT 156 146*    Recent Labs  09/15/12 0635  NA 140  K 4.1  CL 102  CO2 30  BUN 9  CREATININE 0.52  GLUCOSE 136*  CALCIUM 8.5   No results found for this basename: LABPT, INR,  in the last 72 hours  Incision: dressing C/D/I Calf supple Non tender NVI Assessment/Plan: 2 Days Post-Op Procedure(s) (LRB): RIGHT TOTAL HIP ARTHROPLASTY ANTERIOR APPROACH and RIGHT KNEE STEROID INJECTION (Right)Marcea Rojek 09/16/2012, 10:44 AM Up with PT Social worker for SNF

## 2012-09-16 NOTE — Progress Notes (Signed)
Physical Therapy Treatment Patient Details Name: Zoe [name] MRN: [id number] DOB: 1935-12-05 Today's Date: 09/16/2012 Time: 4098-1191 PT Time Calculation (min): 28 min  PT Assessment / Plan / Recommendation  History of Present Illness s/p elective Rt THA    PT Comments   Patient progressing well and able to ambulate a little more. RN requested to give patient enema so return patient to 3n1 from EOB. Will continue to work on ambulation. At this time continue to recommend SNF.   Follow Up Recommendations  SNF;Supervision/Assistance - 24 hour     Does the patient have the potential to tolerate intense rehabilitation     Barriers to Discharge        Equipment Recommendations  Other (comment)    Recommendations for Other Services    Frequency 7X/week   Progress towards PT Goals Progress towards PT goals: Progressing toward goals  Plan Current plan remains appropriate    Precautions / Restrictions Precautions Precautions: None Restrictions RLE Weight Bearing: Weight bearing as tolerated   Pertinent Vitals/Pain no apparent distress     Mobility  Bed Mobility Bed Mobility: Not assessed Supine to Sit: With rails;HOB elevated;4: Min assist Details for Bed Mobility Assistance: Min guard for safety. Pt taking increased time. Cues for technique. A for LEs Transfers Sit to Stand: 4: Min assist;With armrests;From bed;From chair/3-in-1 Stand to Sit: To chair/3-in-1;To bed;4: Min assist;With upper extremity assist Details for Transfer Assistance: Cues for hand placement. A to ensure stability Ambulation/Gait Ambulation/Gait Assistance: 4: Min assist Ambulation Distance (Feet): 30 Feet Assistive device: Rolling walker Ambulation/Gait Assistance Details: Cues for safety and positioning inside of RW. A for balance. Cues toincrease step length as able Gait Pattern: Step-to pattern;Decreased step length - left;Decreased hip/knee flexion - left;Decreased hip/knee flexion - right;Wide  base of support;Trunk flexed Gait velocity: very decreased    Exercises Total Joint Exercises Heel Slides: AAROM;Right;10 reps Hip ABduction/ADduction: AAROM;Right;10 reps Long Arc Quad: AROM;Right;10 reps;Seated;Other (comment)   PT Diagnosis:    PT Problem List:   PT Treatment Interventions:     PT Goals (current goals can now be found in the care plan section)    Visit Information  Last PT Received On: 09/16/12 Assistance Needed: +1 History of Present Illness: s/p elective Rt THA     Subjective Data      Cognition  Cognition Arousal/Alertness: Awake/alert Behavior During Therapy: WFL for tasks assessed/performed Overall Cognitive Status: Within Functional Limits for tasks assessed    Balance     End of Session PT - End of Session Equipment Utilized During Treatment: Gait belt Activity Tolerance: Patient tolerated treatment well Patient left: in chair;with call bell/phone within reach;with family/visitor present Nurse Communication: Mobility status   GP     Fredrich Birks 09/16/2012, 2:29 PM  09/16/2012 Fredrich Birks PTA [phone] pager [phone] office

## 2012-09-17 LAB — CBC
HCT: 27 % — ABNORMAL LOW (ref 36.0–46.0)
Hemoglobin: 9.3 g/dL — ABNORMAL LOW (ref 12.0–15.0)
MCH: 33.5 pg (ref 26.0–34.0)
MCHC: 34.4 g/dL (ref 30.0–36.0)
MCV: 97.1 fL (ref 78.0–100.0)
Platelets: 133 10*3/uL — ABNORMAL LOW (ref 150–400)
RBC: 2.78 MIL/uL — ABNORMAL LOW (ref 3.87–5.11)
RDW: 14.2 % (ref 11.5–15.5)
WBC: 9.7 10*3/uL (ref 4.0–10.5)

## 2012-09-17 MED ORDER — TAMSULOSIN HCL 0.4 MG PO CAPS
0.4000 mg | ORAL_CAPSULE | Freq: Every day | ORAL | Status: DC
  Administered 2012-09-17: 0.4 mg via ORAL
  Filled 2012-09-17 (×2): qty 1

## 2012-09-17 MED ORDER — TAMSULOSIN HCL 0.4 MG PO CAPS: 0.4000 mg | ORAL_CAPSULE | Freq: Every day | ORAL | Status: DC

## 2012-09-17 MED ORDER — HYDROCODONE-ACETAMINOPHEN 5-325 MG PO TABS: 1.0000 | ORAL_TABLET | ORAL | Status: DC | PRN

## 2012-09-17 MED ORDER — METHOCARBAMOL 500 MG PO TABS: 500.0000 mg | ORAL_TABLET | Freq: Four times a day (QID) | ORAL | Status: DC | PRN

## 2012-09-17 MED ORDER — ASPIRIN 325 MG PO TBEC: 325.0000 mg | DELAYED_RELEASE_TABLET | Freq: Two times a day (BID) | ORAL | Status: DC

## 2012-09-17 NOTE — Progress Notes (Signed)
Clinical Social Work Department  BRIEF PSYCHOSOCIAL ASSESSMENT  Patient: Dailynn Nancarrow Kiper  Account Number: [id number]  Admit date: 09/17/12 Clinical Social Worker Sabino Niemann, MSW Date/Time: 09/16/12 Referred by: Physician Date Referred:  Referred for   SNF Placement   Other Referral:  Interview type: Patient and patient's husband Other interview type: PSYCHOSOCIAL DATA  Living Status:Husband Admitted from facility:  Level of care:  Primary support name: Bressi,James L  Primary support relationship to patient: Husband Degree of support available:  Strong and vested  CURRENT CONCERNS  Current Concerns   Post-Acute Placement   Other Concerns:  SOCIAL WORK ASSESSMENT / PLAN  CSW met with pt re: PT recommendation for SNF.   Pt lives with her husband  CSW explained placement process and answered questions.   Pt reports [facility]  as her preference    CSW completed FL2 and initiated SNF search.     Assessment/plan status: Information/Referral to [facility]  Other assessment/ plan:  Information/referral to community resources:  SNF     PATIENT'S/FAMILY'S RESPONSE TO PLAN OF CARE:  Pt  reports she is agreeable to [facility] in order to increase strength and independence with mobility prior to returning home  Pt verbalized understanding of placement process and appreciation for CSW assist.   Sabino Niemann, MSW [phone]

## 2012-09-17 NOTE — Discharge Summary (Signed)
Patient ID: Zoe [name] MRN: [id number] DOB/AGE: [birth year] 77 y.o.  Admit date: 09/14/2012 Discharge date: 09/17/2012  Admission Diagnoses:  Principal Problem:   Arthritis pain of hip   Discharge Diagnoses:  Same  Past Medical History  Diagnosis Date  . GERD (gastroesophageal reflux disease)   . Chronic constipation   . Cough   . Nausea   . Hemorrhoids   . High cholesterol   . Asthma   . Environmental allergies   . Urgency of urination   . Arthritis   . Hypercholesteremia   . Hypertension   . Dysrhythmia     tachycardia  . Anxiety   . Depression   . Pneumonia     7+ YRS  . Fibromyalgia   . Cancer     skin CA    Surgeries: Procedure(s): RIGHT TOTAL HIP ARTHROPLASTY ANTERIOR APPROACH and RIGHT KNEE STEROID INJECTION on 09/14/2012   Consultants:    Discharged Condition: Improved  Hospital Course: Zoe [name] is an 77 y.o. female who was admitted 09/14/2012 for operative treatment ofArthritis pain of hip. Patient has severe unremitting pain that affects sleep, daily activities, and work/hobbies. After pre-op clearance the patient was taken to the operating room on 09/14/2012 and underwent  Procedure(s): RIGHT TOTAL HIP ARTHROPLASTY ANTERIOR APPROACH and RIGHT KNEE STEROID INJECTION.    Patient was given perioperative antibiotics: Anti-infectives   Start     Dose/Rate Route Frequency Ordered Stop   09/14/12 1945  ceFAZolin (ANCEF) IVPB 1 g/50 mL premix     1 g 100 mL/hr over 30 Minutes Intravenous Every 6 hours 09/14/12 1934 09/15/12 0305   09/14/12 0600  ceFAZolin (ANCEF) IVPB 2 g/50 mL premix     2 g 100 mL/hr over 30 Minutes Intravenous On call to O.R. 09/13/12 1423 09/14/12 1245       Patient was given sequential compression devices, early ambulation, and chemoprophylaxis to prevent DVT.  Patient benefited maximally from hospital stay and there were no complications.    Recent vital signs: Patient Vitals for the past 24 hrs:  BP Temp Pulse Resp  SpO2  09/17/12 0632 111/57 mmHg 98.2 F (36.8 C) 57 18 97 %  09/17/12 0400 - - - 16 -  09/17/12 0000 - - - 16 -  09/16/12 2203 119/59 mmHg 98.8 F (37.1 C) 67 18 98 %  09/16/12 2000 - - - 16 -  09/16/12 1600 - - - 20 -  09/16/12 1200 - - - 20 -  09/16/12 1015 124/95 mmHg - 106 - -  09/16/12 0800 - - - 16 -     Recent laboratory studies:  Recent Labs  09/15/12 0635 09/16/12 0500 09/17/12 0610  WBC 10.5 11.7* 9.7  HGB 10.1* 9.8* 9.3*  HCT 29.1* 28.8* 27.0*  PLT 156 146* 133*  NA 140  --   --   K 4.1  --   --   CL 102  --   --   CO2 30  --   --   BUN 9  --   --   CREATININE 0.52  --   --   GLUCOSE 136*  --   --   CALCIUM 8.5  --   --      Discharge Medications:     Medication List    STOP taking these medications       naproxen 500 MG tablet  Commonly known as:  NAPROSYN      TAKE these medications  albuterol 108 (90 BASE) MCG/ACT inhaler  Commonly known as:  PROVENTIL HFA;VENTOLIN HFA  Inhale 2 puffs into the lungs every 6 (six) hours as needed for wheezing or shortness of breath.     allopurinol 300 MG tablet  Commonly known as:  ZYLOPRIM  Take 300 mg by mouth daily.     ALPRAZolam 0.5 MG tablet  Commonly known as:  XANAX  Take 0.5 mg by mouth 3 (three) times daily as needed for anxiety.     amLODipine 2.5 MG tablet  Commonly known as:  NORVASC  Take 2.5 mg by mouth every morning.     aspirin 325 MG EC tablet  Take 1 tablet (325 mg total) by mouth 2 (two) times daily after a meal.     BIOTIN 5000 5 MG Caps  Generic drug:  Biotin  Take 1 capsule by mouth every morning.     digoxin 0.25 MG tablet  Commonly known as:  LANOXIN  Take 250 mcg by mouth at bedtime.     doxepin 100 MG capsule  Commonly known as:  SINEQUAN  Take 100 mg by mouth at bedtime.     gabapentin 300 MG capsule  Commonly known as:  NEURONTIN  Take 300 mg by mouth 2 (two) times daily.     HYDROcodone-acetaminophen 5-325 MG per tablet  Commonly known as:   NORCO/VICODIN  Take 1-2 tablets by mouth every 4 (four) hours as needed.     lansoprazole 30 MG capsule  Commonly known as:  PREVACID  Take by mouth daily.     methocarbamol 500 MG tablet  Commonly known as:  ROBAXIN  Take 1 tablet (500 mg total) by mouth every 6 (six) hours as needed.     multivitamin with minerals Tabs tablet  Take 1 tablet by mouth daily.     pravastatin 20 MG tablet  Commonly known as:  PRAVACHOL  Take 10-20 mg by mouth every morning.     psyllium 58.6 % powder  Commonly known as:  METAMUCIL  Take 1 packet by mouth 3 (three) times daily.     quiNINE 324 MG capsule  Commonly known as:  QUALAQUIN  Take 648 mg by mouth at bedtime as needed (leg cramps).     senna 8.6 MG tablet  Commonly known as:  SENOKOT  Take 5 tablets by mouth 2 (two) times daily.     tamsulosin 0.4 MG Caps capsule  Commonly known as:  FLOMAX  Take 1 capsule (0.4 mg total) by mouth daily.     traMADol 50 MG tablet  Commonly known as:  ULTRAM  Take 50-100 mg by mouth every 6 (six) hours as needed for pain.     zolpidem 10 MG tablet  Commonly known as:  AMBIEN  Take 10 mg by mouth at bedtime as needed for sleep.        Diagnostic Studies: Dg Chest 2 View  09/07/2012   *RADIOLOGY REPORT*  Clinical Data: Right total hip arthroplasty anterior approach and right knee steroid injection. History of smoking, GERD, cough, asthma, hypertension, dysrhythmia.  CHEST - 2 VIEW  Comparison: 03/17/2012 and chest CT 05/28/2009  Findings: Cardiomediastinal silhouette is within normal limits. The lungs are free of focal consolidations and pleural effusions. Left lung calcified granuloma is present and stable. Right upper lobe pleural plaque is noted, stable since prior CT.  IMPRESSION:  No evidence for acute cardiopulmonary abnormality.   Original Report Authenticated By: Norva Pavlov, M.D.   Dg Hip Operative Right  09/14/2012   *RADIOLOGY REPORT*  Clinical Data: History of right total hip  arthroplasty.  DG OPERATIVE RIGHT HIP  Comparison: None.  Findings: In the AP projection there is expected relationship between the acetabulum and acetabular component and the femoral and acetabular hardware components.  No dislocation is seen in the AP projection.  IMPRESSION: Right hip arthroplasty has been performed.  No dislocation is seen. No disruption of hardware is evident.   Original Report Authenticated By: Onalee Hua Call   Dg Pelvis Portable  09/14/2012   *RADIOLOGY REPORT*  Clinical Data: Postop right hip.  PORTABLE PELVIS,PORTABLE RIGHT HIP - 1 VIEW  Comparison: Intraoperative exam 09/14/2012.  Findings: Total right hip prosthesis appears in satisfactory position without complication noted.  Prominent left hip joint degenerative changes.  IMPRESSION: Total right hip prosthesis appears in satisfactory position without complication noted.  Prominent left hip joint degenerative changes.   Original Report Authenticated By: Lacy Duverney, M.D.   Dg Hip Portable 1 View Right  09/14/2012   *RADIOLOGY REPORT*  Clinical Data: Postop right hip.  PORTABLE PELVIS,PORTABLE RIGHT HIP - 1 VIEW  Comparison: Intraoperative exam 09/14/2012.  Findings: Total right hip prosthesis appears in satisfactory position without complication noted.  Prominent left hip joint degenerative changes.  IMPRESSION: Total right hip prosthesis appears in satisfactory position without complication noted.  Prominent left hip joint degenerative changes.   Original Report Authenticated By: Lacy Duverney, M.D.    Disposition: to skilled nursing facility      Discharge Orders   Future Orders Complete By Expires   Call MD / Call 911  As directed    Comments:     If you experience chest pain or shortness of breath, CALL 911 and be transported to the hospital emergency room.  If you develope a fever above 101 F, pus (white drainage) or increased drainage or redness at the wound, or calf pain, call your surgeon's office.   Constipation  Prevention  As directed    Comments:     Drink plenty of fluids.  Prune juice may be helpful.  You may use a stool softener, such as Colace (over the counter) 100 mg twice a day.  Use MiraLax (over the counter) for constipation as needed.   Diet - low sodium heart healthy  As directed    Discharge instructions  As directed    Comments:     Increase activities as comfort allows. Full weight bearing as tolerated right hip. No hip precautions. Can get current dressing wet in the shower. Leave the current dressing in place until her follow-up in 2 weeks.   Discharge patient  As directed    Increase activity slowly as tolerated  As directed       Follow-up Information   Follow up with Kathryne Hitch, MD In 2 weeks.   Specialty:  Orthopedic Surgery   Contact information:   [address] [place] [address] [phone]        Signed: Kathryne Hitch 09/17/2012, 6:59 AM

## 2012-09-17 NOTE — Progress Notes (Signed)
Subjective: 3 Days Post-Op Procedure(s) (LRB): RIGHT TOTAL HIP ARTHROPLASTY ANTERIOR APPROACH and RIGHT KNEE STEROID INJECTION (Right) Patient reports pain as moderate.  Problems with urinary retention and BM's  Objective: Vital signs in last 24 hours: Temp:  [98.2 F (36.8 C)-98.8 F (37.1 C)] 98.2 F (36.8 C) (08/22 1610) Pulse Rate:  [57-106] 57 (08/22 0632) Resp:  [16-20] 18 (08/22 9604) BP: (111-124)/(57-95) 111/57 mmHg (08/22 0632) SpO2:  [97 %-98 %] 97 % (08/22 5409)  Intake/Output from previous day: 08/21 0701 - 08/22 0700 In: 480 [P.O.:480] Out: 3200 [Urine:3200] Intake/Output this shift: Total I/O In: 480 [P.O.:480] Out: 2000 [Urine:2000]   Recent Labs  09/15/12 0635 09/16/12 0500  HGB 10.1* 9.8*    Recent Labs  09/15/12 0635 09/16/12 0500  WBC 10.5 11.7*  RBC 3.03* 2.96*  HCT 29.1* 28.8*  PLT 156 146*    Recent Labs  09/15/12 0635  NA 140  K 4.1  CL 102  CO2 30  BUN 9  CREATININE 0.52  GLUCOSE 136*  CALCIUM 8.5   No results found for this basename: LABPT, INR,  in the last 72 hours  Sensation intact distally Intact pulses distally Dorsiflexion/Plantar flexion intact Incision: dressing C/D/I No cellulitis present Compartment soft  Assessment/Plan: 3 Days Post-Op Procedure(s) (LRB): RIGHT TOTAL HIP ARTHROPLASTY ANTERIOR APPROACH and RIGHT KNEE STEROID INJECTION (Right) Discharge to SNF  [facility] Y 09/17/2012, 6:55 AM

## 2012-09-17 NOTE — Progress Notes (Signed)
Physical Therapy Treatment Patient Details Name: DEEANNA BEIGHTOL MRN: [id number] DOB: Mar 27, 1935 Today's Date: 09/17/2012 Time: 0826-0909 PT Time Calculation (min): 43 min  PT Assessment / Plan / Recommendation  History of Present Illness s/p elective Rt THA    PT Comments   Patient progressing with ambulation and mobility this session. Patient with complaints of sciatica this session but repositioned and cold applied to assist with pain relief.   Follow Up Recommendations  SNF;Supervision/Assistance - 24 hour     Does the patient have the potential to tolerate intense rehabilitation     Barriers to Discharge        Equipment Recommendations  Other (comment)    Recommendations for Other Services    Frequency 7X/week   Progress towards PT Goals Progress towards PT goals: Progressing toward goals  Plan Current plan remains appropriate    Precautions / Restrictions Precautions Precautions: None Restrictions RLE Weight Bearing: Weight bearing as tolerated   Pertinent Vitals/Pain Complaints of sciatica. RN aware and given meds    Mobility  Bed Mobility Supine to Sit: With rails;HOB elevated;4: Min assist Sitting - Scoot to Edge of Bed: 4: Min guard Details for Bed Mobility Assistance: A for LEs out of bed. Cues for positioning and correct technique Transfers Sit to Stand: With armrests;From bed;From chair/3-in-1;4: Min guard Stand to Sit: To chair/3-in-1;To bed;With upper extremity assist;4: Min guard Details for Transfer Assistance: Cues for hand placement.  Ambulation/Gait Ambulation/Gait Assistance: 4: Min guard Ambulation Distance (Feet): 100 Feet Assistive device: Rolling walker Ambulation/Gait Assistance Details: Cues to keep RW closer to her body and to stand upright Gait Pattern: Step-to pattern;Decreased step length - left;Decreased hip/knee flexion - left;Decreased hip/knee flexion - right;Wide base of support;Trunk flexed Gait velocity: very decreased     Exercises Total Joint Exercises Heel Slides: AAROM;Right;10 reps Hip ABduction/ADduction: AAROM;Right;10 reps Long Arc Quad: AROM;Right;10 reps;Seated;Other (comment)   PT Diagnosis:    PT Problem List:   PT Treatment Interventions:     PT Goals (current goals can now be found in the care plan section)    Visit Information  Last PT Received On: 09/17/12 Assistance Needed: +1 History of Present Illness: s/p elective Rt THA     Subjective Data      Cognition  Cognition Arousal/Alertness: Awake/alert Behavior During Therapy: WFL for tasks assessed/performed Overall Cognitive Status: Within Functional Limits for tasks assessed    Balance     End of Session PT - End of Session Equipment Utilized During Treatment: Gait belt Activity Tolerance: Patient tolerated treatment well Patient left: in chair;with call bell/phone within reach;with family/visitor present   GP     Fredrich Birks 09/17/2012, 1:43 PM  09/17/2012 Fredrich Birks PTA [phone] pager [phone] office

## 2012-11-01 ENCOUNTER — Other Ambulatory Visit (HOSPITAL_COMMUNITY): Payer: Self-pay | Admitting: Orthopaedic Surgery

## 2012-11-02 ENCOUNTER — Encounter (HOSPITAL_COMMUNITY)
Admission: RE | Admit: 2012-11-02 | Discharge: 2012-11-02 | Disposition: A | Discharge status: Home / Self Care | Payer: Medicare Other | Source: Ambulatory Visit | Attending: Orthopaedic Surgery | Admitting: Orthopaedic Surgery

## 2012-11-02 ENCOUNTER — Encounter (HOSPITAL_COMMUNITY): Payer: Self-pay

## 2012-11-02 ENCOUNTER — Encounter (HOSPITAL_COMMUNITY): Payer: Self-pay | Admitting: Pharmacy Technician

## 2012-11-02 DIAGNOSIS — R109 Unspecified abdominal pain: Secondary | ICD-10-CM

## 2012-11-02 DIAGNOSIS — Z01812 Encounter for preprocedural laboratory examination: Secondary | ICD-10-CM | POA: Insufficient documentation

## 2012-11-02 HISTORY — DX: Unspecified abdominal pain: R10.9

## 2012-11-02 LAB — APTT: aPTT: 27 seconds (ref 24–37)

## 2012-11-02 LAB — ABO/RH: ABO/RH(D): O NEG

## 2012-11-02 LAB — BASIC METABOLIC PANEL
BUN: 12 mg/dL (ref 6–23)
CO2: 28 mEq/L (ref 19–32)
Calcium: 9.5 mg/dL (ref 8.4–10.5)
Chloride: 102 mEq/L (ref 96–112)
Creatinine, Ser: 0.6 mg/dL (ref 0.50–1.10)
GFR calc Af Amer: >90 mL/min (ref >90)
GFR calc non Af Amer: 88 mL/min — ABNORMAL LOW (ref >90)
Glucose, Bld: 111 mg/dL — ABNORMAL HIGH (ref 70–99)
Potassium: 4.6 mEq/L (ref 3.5–5.1)
Sodium: 140 mEq/L (ref 135–145)

## 2012-11-02 LAB — URINALYSIS, ROUTINE W REFLEX MICROSCOPIC
Bilirubin Urine: NEGATIVE
Glucose, UA: NEGATIVE mg/dL
Hgb urine dipstick: NEGATIVE
Ketones, ur: NEGATIVE mg/dL
Nitrite: NEGATIVE
Protein, ur: NEGATIVE mg/dL
Specific Gravity, Urine: 1.024 (ref 1.005–1.030)
Urobilinogen, UA: 0.2 mg/dL (ref 0.0–1.0)
pH: 5.5 (ref 5.0–8.0)

## 2012-11-02 LAB — URINE MICROSCOPIC-ADD ON

## 2012-11-02 LAB — CBC
HCT: 39 % (ref 36.0–46.0)
Hemoglobin: 12.9 g/dL (ref 12.0–15.0)
MCH: 32.1 pg (ref 26.0–34.0)
MCHC: 33.1 g/dL (ref 30.0–36.0)
MCV: 97 fL (ref 78.0–100.0)
Platelets: 174 10*3/uL (ref 150–400)
RBC: 4.02 MIL/uL (ref 3.87–5.11)
RDW: 13.7 % (ref 11.5–15.5)
WBC: 7.1 10*3/uL (ref 4.0–10.5)

## 2012-11-02 LAB — PROTIME-INR
INR: 1 (ref 0.00–1.49)
Prothrombin Time: 13 seconds (ref 11.6–15.2)

## 2012-11-02 LAB — SURGICAL PCR SCREEN
MRSA, PCR: NEGATIVE
Staphylococcus aureus: NEGATIVE

## 2012-11-02 NOTE — Pre-Procedure Instructions (Addendum)
11-02-12 EKG/ CXR 8'14 Epic. 11-02-12 1300- Note faxed to Dr. Eliberto Ivory office of urine and culture pending.

## 2012-11-02 NOTE — Patient Instructions (Addendum)
20 Zoe [name]  11/02/2012   Your procedure is scheduled on: 10-7  -2014  Report to [facility] at    1200 noon   Call this number if you have problems the morning of surgery: [phone]  Or Presurgical Testing [phone](Opie Maclaughlin)      Do not eat food:After Midnight.  May have clear liquids:up to 6 Hours before arrival. Nothing after : 0900 AM  Clear liquids include soda, tea, black coffee, apple or grape juice, broth.  Take these medicines the morning of surgery with A SIP OF WATER: Allopurinol. Alprazolam. Amlodipine. Digoxin. Allegra. Gabapentin. Pravastatin. Tramadol. Bring/ use Albuterol if needed.   Do not wear jewelry, make-up or nail polish.  Do not wear lotions, powders, or perfumes. You may wear deodorant.  Do not shave 12 hours prior to first CHG shower(legs and under arms).(face and neck okay.)  Do not bring valuables to the hospital.  Contacts, dentures or bridgework,body piercing,  may not be worn into surgery.  Leave suitcase in the car. After surgery it may be brought to your room.  For patients admitted to the hospital, checkout time is 11:00 AM the day of discharge.   Patients discharged the day of surgery will not be allowed to drive home. Must have responsible person with you x 24 hours once discharged.  Name and phone number of your driver: Sheliah Plane [phone] h  Special Instructions: CHG(Chlorhedine 4%-"Hibiclens","Betasept","Aplicare") Shower Use Special Wash: see special instructions.(avoid face and genitals)   Please read over the following fact sheets that you were given: MRSA Information, Blood Transfusion fact sheet, Incentive Spirometry Instruction.    Failure to follow these instructions may result in Cancellation of your surgery.   Patient signature_______________________________________________________

## 2012-11-02 NOTE — Progress Notes (Signed)
11-02-12 1310- Labs viewable in Epic-Please note. Pt. Taking Cipro by PCP for UTI-and remains on at present. Culture pending.

## 2012-11-03 LAB — URINE CULTURE
Colony Count: NO GROWTH
Culture: NO GROWTH

## 2012-11-05 ENCOUNTER — Encounter (HOSPITAL_COMMUNITY): Payer: Self-pay | Admitting: *Deleted

## 2012-11-05 ENCOUNTER — Inpatient Hospital Stay (HOSPITAL_COMMUNITY): Payer: Medicare Other | Admitting: Anesthesiology

## 2012-11-05 ENCOUNTER — Encounter (HOSPITAL_COMMUNITY)
Admission: RE | Disposition: A | Discharge status: Skilled Nursing Facility | Payer: Self-pay | Source: Ambulatory Visit | Attending: Orthopaedic Surgery

## 2012-11-05 ENCOUNTER — Encounter (HOSPITAL_COMMUNITY): Payer: Medicare Other | Admitting: Anesthesiology

## 2012-11-05 ENCOUNTER — Inpatient Hospital Stay (HOSPITAL_COMMUNITY): Payer: Medicare Other

## 2012-11-05 ENCOUNTER — Inpatient Hospital Stay (HOSPITAL_COMMUNITY)
Admission: RE | Admit: 2012-11-05 | Discharge: 2012-11-09 | DRG: 470 | Disposition: A | Discharge status: Skilled Nursing Facility | Payer: Medicare Other | Source: Ambulatory Visit | Attending: Orthopaedic Surgery | Admitting: Orthopaedic Surgery

## 2012-11-05 DIAGNOSIS — M169 Osteoarthritis of hip, unspecified: Secondary | ICD-10-CM

## 2012-11-05 DIAGNOSIS — I10 Essential (primary) hypertension: Secondary | ICD-10-CM | POA: Diagnosis present

## 2012-11-05 DIAGNOSIS — D62 Acute posthemorrhagic anemia: Secondary | ICD-10-CM | POA: Diagnosis not present

## 2012-11-05 DIAGNOSIS — IMO0001 Reserved for inherently not codable concepts without codable children: Secondary | ICD-10-CM | POA: Diagnosis present

## 2012-11-05 DIAGNOSIS — E7800 Pure hypercholesterolemia, unspecified: Secondary | ICD-10-CM | POA: Diagnosis present

## 2012-11-05 DIAGNOSIS — F3289 Other specified depressive episodes: Secondary | ICD-10-CM | POA: Diagnosis present

## 2012-11-05 DIAGNOSIS — M1610 Unilateral primary osteoarthritis, unspecified hip: Principal | ICD-10-CM | POA: Diagnosis present

## 2012-11-05 DIAGNOSIS — J45909 Unspecified asthma, uncomplicated: Secondary | ICD-10-CM | POA: Diagnosis present

## 2012-11-05 DIAGNOSIS — F411 Generalized anxiety disorder: Secondary | ICD-10-CM | POA: Diagnosis present

## 2012-11-05 DIAGNOSIS — K219 Gastro-esophageal reflux disease without esophagitis: Secondary | ICD-10-CM | POA: Diagnosis present

## 2012-11-05 DIAGNOSIS — F329 Major depressive disorder, single episode, unspecified: Secondary | ICD-10-CM | POA: Diagnosis present

## 2012-11-05 DIAGNOSIS — Z96649 Presence of unspecified artificial hip joint: Secondary | ICD-10-CM

## 2012-11-05 DIAGNOSIS — F17200 Nicotine dependence, unspecified, uncomplicated: Secondary | ICD-10-CM | POA: Diagnosis present

## 2012-11-05 DIAGNOSIS — Z79899 Other long term (current) drug therapy: Secondary | ICD-10-CM

## 2012-11-05 HISTORY — PX: TOTAL HIP ARTHROPLASTY: SHX124

## 2012-11-05 LAB — TYPE AND SCREEN
ABO/RH(D): O NEG
Antibody Screen: NEGATIVE

## 2012-11-05 SURGERY — ARTHROPLASTY, HIP, TOTAL, ANTERIOR APPROACH
Anesthesia: Spinal | Site: Hip | Laterality: Left | Wound class: Clean

## 2012-11-05 MED ORDER — METOCLOPRAMIDE HCL 10 MG PO TABS: 5.0000 mg | ORAL_TABLET | Freq: Three times a day (TID) | ORAL | Status: DC | PRN

## 2012-11-05 MED ORDER — DIGOXIN 250 MCG PO TABS
250.0000 ug | ORAL_TABLET | Freq: Every morning | ORAL | Status: DC
  Administered 2012-11-06 – 2012-11-08 (×2): 250 ug via ORAL
  Filled 2012-11-05 (×4): qty 1

## 2012-11-05 MED ORDER — ALUM & MAG HYDROXIDE-SIMETH 200-200-20 MG/5ML PO SUSP: 30.0000 mL | ORAL | Status: DC | PRN

## 2012-11-05 MED ORDER — AMLODIPINE BESYLATE 2.5 MG PO TABS
2.5000 mg | ORAL_TABLET | Freq: Every day | ORAL | Status: DC
  Administered 2012-11-08: 2.5 mg via ORAL
  Filled 2012-11-05 (×4): qty 1

## 2012-11-05 MED ORDER — ZOLPIDEM TARTRATE 5 MG PO TABS
5.0000 mg | ORAL_TABLET | Freq: Every evening | ORAL | Status: DC | PRN
  Administered 2012-11-06 – 2012-11-08 (×3): 5 mg via ORAL
  Filled 2012-11-05 (×3): qty 1

## 2012-11-05 MED ORDER — SODIUM CHLORIDE 0.9 % IR SOLN
Status: DC | PRN
  Administered 2012-11-05: 1000 mL

## 2012-11-05 MED ORDER — FENTANYL CITRATE 0.05 MG/ML IJ SOLN
INTRAMUSCULAR | Status: DC | PRN
  Administered 2012-11-05: 50 ug via INTRAVENOUS

## 2012-11-05 MED ORDER — CEFAZOLIN SODIUM-DEXTROSE 2-3 GM-% IV SOLR
INTRAVENOUS | Status: AC
  Filled 2012-11-05: qty 50

## 2012-11-05 MED ORDER — METOCLOPRAMIDE HCL 5 MG/ML IJ SOLN: 5.0000 mg | Freq: Three times a day (TID) | INTRAMUSCULAR | Status: DC | PRN

## 2012-11-05 MED ORDER — PHENOL 1.4 % MT LIQD: 1.0000 | OROMUCOSAL | Status: DC | PRN

## 2012-11-05 MED ORDER — ADULT MULTIVITAMIN W/MINERALS CH
1.0000 | ORAL_TABLET | Freq: Every day | ORAL | Status: DC
  Administered 2012-11-06 – 2012-11-08 (×3): 1 via ORAL
  Filled 2012-11-05 (×4): qty 1

## 2012-11-05 MED ORDER — QUININE SULFATE 324 MG PO CAPS: 324.0000 mg | ORAL_CAPSULE | Freq: Every day | ORAL | Status: DC

## 2012-11-05 MED ORDER — METHOCARBAMOL 100 MG/ML IJ SOLN
500.0000 mg | Freq: Four times a day (QID) | INTRAVENOUS | Status: DC | PRN
  Filled 2012-11-05 (×2): qty 5

## 2012-11-05 MED ORDER — SENNA 8.6 MG PO TABS
1.0000 | ORAL_TABLET | Freq: Two times a day (BID) | ORAL | Status: DC
  Administered 2012-11-05: 8.6 mg via ORAL
  Filled 2012-11-05: qty 1

## 2012-11-05 MED ORDER — MIDAZOLAM HCL 2 MG/2ML IJ SOLN
INTRAMUSCULAR | Status: AC
  Filled 2012-11-05: qty 2

## 2012-11-05 MED ORDER — ALBUTEROL SULFATE HFA 108 (90 BASE) MCG/ACT IN AERS
2.0000 | INHALATION_SPRAY | Freq: Four times a day (QID) | RESPIRATORY_TRACT | Status: DC | PRN
Start: 2012-11-05 — End: 2012-11-09

## 2012-11-05 MED ORDER — HYDROCODONE-ACETAMINOPHEN 5-325 MG PO TABS
1.0000 | ORAL_TABLET | ORAL | Status: DC | PRN
  Administered 2012-11-05 – 2012-11-09 (×13): 2 via ORAL
  Filled 2012-11-05 (×12): qty 2

## 2012-11-05 MED ORDER — SODIUM CHLORIDE 0.9 % IV SOLN
INTRAVENOUS | Status: DC
  Administered 2012-11-05 – 2012-11-07 (×3): via INTRAVENOUS

## 2012-11-05 MED ORDER — ONDANSETRON HCL 4 MG PO TABS: 4.0000 mg | ORAL_TABLET | Freq: Four times a day (QID) | ORAL | Status: DC | PRN

## 2012-11-05 MED ORDER — MAGNESIUM CITRATE PO SOLN: 1.0000 | Freq: Once | ORAL | Status: AC | PRN

## 2012-11-05 MED ORDER — CEFAZOLIN SODIUM 1-5 GM-% IV SOLN: 1.0000 g | Freq: Four times a day (QID) | INTRAVENOUS | Status: DC

## 2012-11-05 MED ORDER — BUPIVACAINE HCL (PF) 0.5 % IJ SOLN
INTRAMUSCULAR | Status: DC | PRN
  Administered 2012-11-05: 12.5 mg

## 2012-11-05 MED ORDER — POLYETHYLENE GLYCOL 3350 17 G PO PACK: 17.0000 g | PACK | Freq: Every day | ORAL | Status: DC | PRN

## 2012-11-05 MED ORDER — SENNOSIDES 8.6 MG PO TABS: 5.0000 | ORAL_TABLET | Freq: Two times a day (BID) | ORAL | Status: DC

## 2012-11-05 MED ORDER — 0.9 % SODIUM CHLORIDE (POUR BTL) OPTIME
TOPICAL | Status: DC | PRN
  Administered 2012-11-05: 1000 mL

## 2012-11-05 MED ORDER — ACETAMINOPHEN 325 MG PO TABS: 650.0000 mg | ORAL_TABLET | Freq: Four times a day (QID) | ORAL | Status: DC | PRN

## 2012-11-05 MED ORDER — LACTATED RINGERS IV SOLN
INTRAVENOUS | Status: DC | PRN
  Administered 2012-11-05 (×3): via INTRAVENOUS

## 2012-11-05 MED ORDER — SIMVASTATIN 10 MG PO TABS
10.0000 mg | ORAL_TABLET | Freq: Every day | ORAL | Status: DC
  Administered 2012-11-06 – 2012-11-08 (×3): 10 mg via ORAL
  Filled 2012-11-05 (×4): qty 1

## 2012-11-05 MED ORDER — MENTHOL 3 MG MT LOZG: 1.0000 | LOZENGE | OROMUCOSAL | Status: DC | PRN

## 2012-11-05 MED ORDER — ASPIRIN EC 325 MG PO TBEC
325.0000 mg | DELAYED_RELEASE_TABLET | Freq: Two times a day (BID) | ORAL | Status: DC
  Administered 2012-11-06 – 2012-11-09 (×7): 325 mg via ORAL
  Filled 2012-11-05 (×9): qty 1

## 2012-11-05 MED ORDER — PANTOPRAZOLE SODIUM 40 MG PO TBEC
40.0000 mg | DELAYED_RELEASE_TABLET | Freq: Every day | ORAL | Status: DC
  Administered 2012-11-06 – 2012-11-08 (×3): 40 mg via ORAL
  Filled 2012-11-05 (×4): qty 1

## 2012-11-05 MED ORDER — DIPHENHYDRAMINE HCL 12.5 MG/5ML PO ELIX
12.5000 mg | ORAL_SOLUTION | ORAL | Status: DC | PRN
  Administered 2012-11-05 – 2012-11-06 (×2): 12.5 mg via ORAL
  Administered 2012-11-06: 25 mg via ORAL
  Administered 2012-11-06: 12.5 mg via ORAL
  Administered 2012-11-07: 25 mg via ORAL
  Filled 2012-11-05: qty 5
  Filled 2012-11-05: qty 10
  Filled 2012-11-05: qty 5
  Filled 2012-11-05: qty 10

## 2012-11-05 MED ORDER — PROMETHAZINE HCL 25 MG/ML IJ SOLN: 6.2500 mg | INTRAMUSCULAR | Status: DC | PRN

## 2012-11-05 MED ORDER — MIDAZOLAM HCL 2 MG/2ML IJ SOLN
INTRAMUSCULAR | Status: DC | PRN
  Administered 2012-11-05: 2 mg via INTRAVENOUS
  Administered 2012-11-05: 0.5 mg via INTRAVENOUS
  Administered 2012-11-05: 1 mg via INTRAVENOUS
  Administered 2012-11-05: 0.5 mg via INTRAVENOUS

## 2012-11-05 MED ORDER — DOXEPIN HCL 100 MG PO CAPS
100.0000 mg | ORAL_CAPSULE | Freq: Every day | ORAL | Status: DC
  Administered 2012-11-05 – 2012-11-08 (×4): 100 mg via ORAL
  Filled 2012-11-05 (×6): qty 1

## 2012-11-05 MED ORDER — GABAPENTIN 300 MG PO CAPS
300.0000 mg | ORAL_CAPSULE | Freq: Two times a day (BID) | ORAL | Status: DC
  Administered 2012-11-05 – 2012-11-08 (×7): 300 mg via ORAL
  Filled 2012-11-05 (×9): qty 1

## 2012-11-05 MED ORDER — SORBITOL 70 % SOLN
30.0000 mL | Freq: Every day | Status: DC | PRN
  Filled 2012-11-05: qty 30

## 2012-11-05 MED ORDER — HYDROMORPHONE HCL PF 1 MG/ML IJ SOLN: 0.2500 mg | INTRAMUSCULAR | Status: DC | PRN

## 2012-11-05 MED ORDER — METHOCARBAMOL 500 MG PO TABS
500.0000 mg | ORAL_TABLET | Freq: Four times a day (QID) | ORAL | Status: DC | PRN
  Administered 2012-11-06 – 2012-11-09 (×7): 500 mg via ORAL
  Filled 2012-11-05 (×7): qty 1

## 2012-11-05 MED ORDER — OXYCODONE HCL 5 MG PO TABS
5.0000 mg | ORAL_TABLET | ORAL | Status: DC | PRN
Start: 2012-11-05 — End: 2012-11-05

## 2012-11-05 MED ORDER — MORPHINE SULFATE 2 MG/ML IJ SOLN: 2.0000 mg | INTRAMUSCULAR | Status: DC | PRN

## 2012-11-05 MED ORDER — LORATADINE 10 MG PO TABS
10.0000 mg | ORAL_TABLET | Freq: Every day | ORAL | Status: DC
  Administered 2012-11-06 – 2012-11-08 (×3): 10 mg via ORAL
  Filled 2012-11-05 (×4): qty 1

## 2012-11-05 MED ORDER — HYDROMORPHONE HCL PF 1 MG/ML IJ SOLN
1.0000 mg | INTRAMUSCULAR | Status: DC | PRN
  Administered 2012-11-05 – 2012-11-07 (×10): 1 mg via INTRAVENOUS
  Filled 2012-11-05 (×11): qty 1

## 2012-11-05 MED ORDER — CEFAZOLIN SODIUM-DEXTROSE 2-3 GM-% IV SOLR
2.0000 g | INTRAVENOUS | Status: AC
  Administered 2012-11-05: 2 g via INTRAVENOUS

## 2012-11-05 MED ORDER — BUPIVACAINE HCL (PF) 0.5 % IJ SOLN
INTRAMUSCULAR | Status: AC
  Filled 2012-11-05: qty 30

## 2012-11-05 MED ORDER — MEPERIDINE HCL 50 MG/ML IJ SOLN
INTRAMUSCULAR | Status: AC
  Filled 2012-11-05: qty 1

## 2012-11-05 MED ORDER — LACTATED RINGERS IV SOLN: INTRAVENOUS | Status: DC

## 2012-11-05 MED ORDER — PROPOFOL INFUSION 10 MG/ML OPTIME
INTRAVENOUS | Status: DC | PRN
  Administered 2012-11-05: 200 ug/kg/min via INTRAVENOUS

## 2012-11-05 MED ORDER — MEPERIDINE HCL 50 MG/ML IJ SOLN
6.2500 mg | INTRAMUSCULAR | Status: DC | PRN
  Administered 2012-11-05 (×2): 12.5 mg via INTRAVENOUS

## 2012-11-05 MED ORDER — ONDANSETRON HCL 4 MG/2ML IJ SOLN: 4.0000 mg | Freq: Four times a day (QID) | INTRAMUSCULAR | Status: DC | PRN

## 2012-11-05 MED ORDER — DEXTROSE 5 % IV SOLN
10.0000 mg | INTRAVENOUS | Status: DC | PRN
  Administered 2012-11-05: 20 ug/min via INTRAVENOUS

## 2012-11-05 MED ORDER — ALLOPURINOL 300 MG PO TABS
300.0000 mg | ORAL_TABLET | Freq: Every day | ORAL | Status: DC
  Administered 2012-11-05 – 2012-11-08 (×4): 300 mg via ORAL
  Filled 2012-11-05 (×6): qty 1

## 2012-11-05 MED ORDER — EPHEDRINE SULFATE 50 MG/ML IJ SOLN
INTRAMUSCULAR | Status: DC | PRN
  Administered 2012-11-05: 10 mg via INTRAVENOUS

## 2012-11-05 MED ORDER — PROPOFOL 10 MG/ML IV BOLUS
INTRAVENOUS | Status: DC | PRN
  Administered 2012-11-05 (×2): 30 mg via INTRAVENOUS

## 2012-11-05 MED ORDER — ACETAMINOPHEN 650 MG RE SUPP: 650.0000 mg | Freq: Four times a day (QID) | RECTAL | Status: DC | PRN

## 2012-11-05 MED ORDER — MORPHINE SULFATE 2 MG/ML IJ SOLN
INTRAMUSCULAR | Status: AC
  Administered 2012-11-05: 2 mg via INTRAVENOUS
  Filled 2012-11-05: qty 1

## 2012-11-05 MED ORDER — ALPRAZOLAM 0.5 MG PO TABS
0.5000 mg | ORAL_TABLET | Freq: Three times a day (TID) | ORAL | Status: DC | PRN
  Administered 2012-11-06 – 2012-11-09 (×4): 0.5 mg via ORAL
  Filled 2012-11-05 (×4): qty 1

## 2012-11-05 SURGICAL SUPPLY — 50 items
ADH SKN CLS APL DERMABOND .7 (GAUZE/BANDAGES/DRESSINGS) ×1
BAG SPEC THK2 15X12 ZIP CLS (MISCELLANEOUS)
BAG ZIPLOCK 12X15 (MISCELLANEOUS) IMPLANT
BLADE SAW SGTL 18X1.27X75 (BLADE) ×2 IMPLANT
CAPT HIP PF MOP ×2 IMPLANT
CELLS DAT CNTRL 66122 CELL SVR (MISCELLANEOUS) ×1 IMPLANT
DERMABOND ADVANCED (GAUZE/BANDAGES/DRESSINGS) ×1
DERMABOND ADVANCED .7 DNX12 (GAUZE/BANDAGES/DRESSINGS) ×1 IMPLANT
DRAPE C-ARM 42X120 X-RAY (DRAPES) ×2 IMPLANT
DRAPE STERI IOBAN 125X83 (DRAPES) ×2 IMPLANT
DRAPE U-SHAPE 47X51 STRL (DRAPES) ×6 IMPLANT
DRSG AQUACEL AG ADV 3.5X10 (GAUZE/BANDAGES/DRESSINGS) ×2 IMPLANT
DURAPREP 26ML APPLICATOR (WOUND CARE) ×2 IMPLANT
ELECT BLADE TIP CTD 4 INCH (ELECTRODE) ×2 IMPLANT
ELECT REM PT RETURN 9FT ADLT (ELECTROSURGICAL) ×2
ELECTRODE REM PT RTRN 9FT ADLT (ELECTROSURGICAL) ×1 IMPLANT
FACESHIELD LNG OPTICON STERILE (SAFETY) ×8 IMPLANT
GAUZE XEROFORM 5X9 LF (GAUZE/BANDAGES/DRESSINGS) ×2 IMPLANT
GLOVE BIO SURGEON STRL SZ7.5 (GLOVE) ×2 IMPLANT
GLOVE BIOGEL PI IND STRL 7.5 (GLOVE) ×1 IMPLANT
GLOVE BIOGEL PI IND STRL 8 (GLOVE) ×2 IMPLANT
GLOVE BIOGEL PI INDICATOR 7.5 (GLOVE) ×1
GLOVE BIOGEL PI INDICATOR 8 (GLOVE) ×2
GLOVE ECLIPSE 8.0 STRL XLNG CF (GLOVE) ×2 IMPLANT
GLOVE SURG SS PI 7.5 STRL IVOR (GLOVE) ×2 IMPLANT
GLOVE SURG SS PI 8.5 STRL IVOR (GLOVE) ×2
GLOVE SURG SS PI 8.5 STRL STRW (GLOVE) ×2 IMPLANT
GOWN STRL REIN 2XL XLG LVL4 (GOWN DISPOSABLE) ×2 IMPLANT
GOWN STRL REIN XL XLG (GOWN DISPOSABLE) ×6 IMPLANT
HANDPIECE INTERPULSE COAX TIP (DISPOSABLE) ×2
IV NS 1000ML (IV SOLUTION) ×2
IV NS 1000ML BAXH (IV SOLUTION) ×1 IMPLANT
KIT BASIN OR (CUSTOM PROCEDURE TRAY) ×2 IMPLANT
NS IRRIG 1000ML POUR BTL (IV SOLUTION) ×2 IMPLANT
PACK TOTAL JOINT (CUSTOM PROCEDURE TRAY) ×2 IMPLANT
PADDING CAST COTTON 6X4 STRL (CAST SUPPLIES) ×2 IMPLANT
RTRCTR WOUND ALEXIS 18CM MED (MISCELLANEOUS) ×2
SET HNDPC FAN SPRY TIP SCT (DISPOSABLE) ×1 IMPLANT
STAPLER VISISTAT 35W (STAPLE) IMPLANT
SUT ETHIBOND NAB CT1 #1 30IN (SUTURE) ×4 IMPLANT
SUT ETHILON 3 0 PS 1 (SUTURE) ×2 IMPLANT
SUT MNCRL AB 4-0 PS2 18 (SUTURE) ×2 IMPLANT
SUT VIC AB 0 CT1 36 (SUTURE) ×2 IMPLANT
SUT VIC AB 1 CT1 36 (SUTURE) ×4 IMPLANT
SUT VIC AB 2-0 CT1 27 (SUTURE) ×4
SUT VIC AB 2-0 CT1 TAPERPNT 27 (SUTURE) ×2 IMPLANT
TOWEL OR 17X26 10 PK STRL BLUE (TOWEL DISPOSABLE) ×2 IMPLANT
TOWEL OR NON WOVEN STRL DISP B (DISPOSABLE) ×2 IMPLANT
TRAY FOLEY CATH 14FRSI W/METER (CATHETERS) ×2 IMPLANT
WATER STERILE IRR 1500ML POUR (IV SOLUTION) ×4 IMPLANT

## 2012-11-05 NOTE — Brief Op Note (Signed)
11/05/2012  6:15 PM  PATIENT:  Zoe [name]  77 y.o. female  PRE-OPERATIVE DIAGNOSIS:  severe osteoarthritis left hip  POST-OPERATIVE DIAGNOSIS:  severe osteoarthritis left hip  PROCEDURE:  Procedure(s): LEFT TOTAL HIP ARTHROPLASTY ANTERIOR APPROACH (Left)  SURGEON:  Surgeon(s) and Role:    * Kathryne Hitch, MD - Primary  PHYSICIAN ASSISTANT: Rexene Edison, PA-C  ANESTHESIA:   spinal  EBL:  Total I/O In: 2000 [I.V.:2000] Out: 900 [Urine:550; Blood:350]  BLOOD ADMINISTERED:none  DRAINS: none   LOCAL MEDICATIONS USED:  NONE  SPECIMEN:  No Specimen  DISPOSITION OF SPECIMEN:  N/A  COUNTS:  YES  TOURNIQUET:  * No tourniquets in log *  DICTATION: .Other Dictation: Dictation Number [id number]  PLAN OF CARE: Admit to inpatient   PATIENT DISPOSITION:  PACU - hemodynamically stable.   Delay start of Pharmacological VTE agent (>24hrs) due to surgical blood loss or risk of bleeding: no

## 2012-11-05 NOTE — Anesthesia Preprocedure Evaluation (Addendum)
Anesthesia Evaluation  Patient identified by MRN, date of birth, ID band Patient awake    Reviewed: Allergy & Precautions, H&P , NPO status , Patient's Chart, lab work & pertinent test results  Airway Mallampati: II TM Distance: >3 FB Neck ROM: Full    Dental no notable dental hx.    Pulmonary neg pulmonary ROS,  breath sounds clear to auscultation  Pulmonary exam normal       Cardiovascular hypertension, Pt. on medications Rhythm:Regular Rate:Normal     Neuro/Psych negative neurological ROS  negative psych ROS   GI/Hepatic Neg liver ROS, GERD-  ,  Endo/Other  negative endocrine ROS  Renal/GU negative Renal ROS  negative genitourinary   Musculoskeletal negative musculoskeletal ROS (+)   Abdominal   Peds negative pediatric ROS (+)  Hematology negative hematology ROS (+)   Anesthesia Other Findings   Reproductive/Obstetrics negative OB ROS                           Anesthesia Physical Anesthesia Plan  ASA: II  Anesthesia Plan: Spinal   Post-op Pain Management:    Induction: Intravenous  Airway Management Planned: Simple Face Mask  Additional Equipment:   Intra-op Plan:   Post-operative Plan:   Informed Consent: I have reviewed the patients History and Physical, chart, labs and discussed the procedure including the risks, benefits and alternatives for the proposed anesthesia with the patient or authorized representative who has indicated his/her understanding and acceptance.   Dental advisory given  Plan Discussed with: CRNA and Surgeon  Anesthesia Plan Comments:        Anesthesia Quick Evaluation

## 2012-11-05 NOTE — Anesthesia Procedure Notes (Addendum)
Anesthesia Procedure Note  Spinal  Patient location during procedure: OR Staffing Performed by: anesthesiologist  Preanesthetic Checklist Completed: patient identified, site marked, surgical consent, pre-op evaluation, timeout performed, IV checked, risks and benefits discussed and monitors and equipment checked Spinal Block Patient position: sitting Prep: Betadine Patient monitoring: heart rate, continuous pulse ox and blood pressure Injection technique: single-shot Needle Needle type: Sprotte  Needle gauge: 24 G Needle length: 9 cm Additional Notes Expiration date of kit checked and confirmed. Patient tolerated procedure well, without complications.

## 2012-11-05 NOTE — Transfer of Care (Signed)
Immediate Anesthesia Transfer of Care Note  Patient: Zoe [name]  Procedure(s) Performed: Procedure(s): LEFT TOTAL HIP ARTHROPLASTY ANTERIOR APPROACH (Left)  Patient Location: PACU  Anesthesia Type:Regional and Spinal  Level of Consciousness: awake, alert , oriented and patient cooperative  Airway & Oxygen Therapy: Patient Spontanous Breathing and Patient connected to face mask oxygen  Post-op Assessment: Report given to PACU RN and Post -op Vital signs reviewed and stable  Post vital signs: Reviewed and stable  Complications: No apparent anesthesia complications

## 2012-11-05 NOTE — Progress Notes (Signed)
Patient developed severe abdominal pain after morphine injection. No pain relief in left hip. Pain med changed per doctor's order.  Pain level decreased since administration of dilaudid injection.

## 2012-11-05 NOTE — H&P (Signed)
TOTAL HIP ADMISSION H&P  Patient is admitted for left total hip arthroplasty.  Subjective:  Chief Complaint: left hip pain  HPI: Zoe [name], 77 y.o. female, has a history of pain and functional disability in the left hip(s) due to arthritis and patient has failed non-surgical conservative treatments for greater than 12 weeks to include NSAID's and/or analgesics, corticosteriod injections, supervised PT with diminished ADL's post treatment, use of assistive devices, weight reduction as appropriate and activity modification.  Onset of symptoms was gradual starting 4 years ago with gradually worsening course since that time.The patient noted no past surgery on the left hip(s).  Patient currently rates pain in the left hip at 10 out of 10 with activity. Patient has night pain, worsening of pain with activity and weight bearing, trendelenberg gait, pain that interfers with activities of daily living, pain with passive range of motion and crepitus. Patient has evidence of subchondral cysts, subchondral sclerosis, periarticular osteophytes and joint space narrowing by imaging studies. This condition presents safety issues increasing the risk of falls.  There is no current active infection.  Patient Active Problem List   Diagnosis Date Noted  . Degenerative arthritis of hip, left 11/05/2012  . Arthritis pain of hip 09/14/2012  . Weakness of right leg 03/24/2011  . OA (osteoarthritis) 03/24/2011  . Stiffness of cervical spine 03/24/2011  . Chronic constipation 11/19/2010  . GERD (gastroesophageal reflux disease) 11/19/2010  . SPINAL STENOSIS 04/18/2009  . HIP PAIN 11/17/2007  . DEGEN LUMBAR/LUMBOSACRAL INTERVERTEBRAL DISC 11/17/2007  . ANSERINE BURSITIS, RIGHT 08/11/2007  . SCIATICA 06/22/2007  . KNEE PAIN 03/17/2007   Past Medical History  Diagnosis Date  . GERD (gastroesophageal reflux disease)   . Chronic constipation   . Cough   . Nausea   . Hemorrhoids   . High cholesterol   .  Asthma   . Environmental allergies   . Urgency of urination   . Arthritis   . Hypercholesteremia   . Hypertension   . Dysrhythmia     tachycardia  . Anxiety   . Depression   . Pneumonia     7+ YRS  . Fibromyalgia   . Cancer     skin CA  . Abdominal discomfort 11-02-12    suspected ?UTI- PCP MD placed on Cipro    Past Surgical History  Procedure Laterality Date  . Upper gastrointestinal endoscopy  08/03/2009    NUR  . Upper gastrointestinal endoscopy  03/29/2003    EGD ED TCS  . Colonoscopy  03/29/03  . Umbilical hernia repair  04/26/03    JENKINS  . Cholecystectomy    . Appendectomy    . Eye surgery      bilateral cataract removal  . Skin cancer excision      RIGHT NECK    . Tonsillectomy      T+A  . Back surgery      X2   . Total hip arthroplasty Right 09/14/2012    Procedure: RIGHT TOTAL HIP ARTHROPLASTY ANTERIOR APPROACH and RIGHT KNEE STEROID INJECTION;  Surgeon: Kathryne Hitch, MD;  Location: [facility] OR;  Service: Orthopedics;  Laterality: Right;    Prescriptions prior to admission  Medication Sig Dispense Refill  . allopurinol (ZYLOPRIM) 300 MG tablet Take 300 mg by mouth daily.      Marland Kitchen ALPRAZolam (XANAX) 0.5 MG tablet Take 0.5 mg by mouth 3 (three) times daily as needed for anxiety.       Marland Kitchen amLODipine (NORVASC) 2.5 MG tablet Take 2.5  mg by mouth every morning.       . ciprofloxacin (CIPRO) 250 MG tablet Take 250 mg by mouth 2 (two) times daily.      . digoxin (LANOXIN) 0.25 MG tablet Take 250 mcg by mouth every morning.       Marland Kitchen doxepin (SINEQUAN) 100 MG capsule Take 100 mg by mouth at bedtime.       . fexofenadine (ALLEGRA) 60 MG tablet Take 60 mg by mouth daily.      Marland Kitchen gabapentin (NEURONTIN) 300 MG capsule Take 300 mg by mouth 2 (two) times daily.       Marland Kitchen omeprazole (PRILOSEC) 20 MG capsule Take 20 mg by mouth daily.      . pravastatin (PRAVACHOL) 20 MG tablet Take 20 mg by mouth every morning.       . Probiotic Product (PROBIOTIC PO) Take by mouth.      .  quiNINE (QUALAQUIN) 324 MG capsule Take 324 mg by mouth at bedtime.       . senna (SENOKOT) 8.6 MG tablet Take 5 tablets by mouth 2 (two) times daily.       . traMADol (ULTRAM) 50 MG tablet Take 50 mg by mouth every 6 (six) hours as needed for pain.       Marland Kitchen zolpidem (AMBIEN) 10 MG tablet Take 10 mg by mouth at bedtime as needed for sleep.      Marland Kitchen albuterol (PROVENTIL HFA;VENTOLIN HFA) 108 (90 BASE) MCG/ACT inhaler Inhale 2 puffs into the lungs every 6 (six) hours as needed for wheezing or shortness of breath.      Marland Kitchen aspirin EC 81 MG tablet Take 81 mg by mouth daily.      . Biotin (BIOTIN 5000) 5 MG CAPS Take 1 capsule by mouth every morning.      . Multiple Vitamin (MULITIVITAMIN WITH MINERALS) TABS Take 1 tablet by mouth daily.      . naproxen (NAPROSYN) 500 MG tablet Take 250 mg by mouth 2 (two) times daily with a meal.      . psyllium (METAMUCIL) 58.6 % powder Take 1 packet by mouth 3 (three) times daily.       Allergies  Allergen Reactions  . Codeine Other (See Comments)    Pains in abdominal area  . Other     NOVACAINE     ITCHING    History  Substance Use Topics  . Smoking status: Current Some Day Smoker -- 0.25 packs/day for 50 years    Types: Cigarettes  . Smokeless tobacco: Never Used     Comment: Patient states that she may smoke when others smoke, this is seldom  . Alcohol Use: Yes     Comment: social    Family History  Problem Relation Age of Onset  . Healthy Son   . Healthy Son   . Healthy Daughter      Review of Systems  Gastrointestinal: Positive for abdominal pain.  Musculoskeletal: Positive for joint pain.  All other systems reviewed and are negative.    Objective:  Physical Exam  Constitutional: She is oriented to person, place, and time. She appears well-developed and well-nourished.  HENT:  Head: Normocephalic and atraumatic.  Eyes: EOM are normal. Pupils are equal, round, and reactive to light.  Neck: Normal range of motion. Neck supple.   Cardiovascular: Normal rate and regular rhythm.   Respiratory: Effort normal and breath sounds normal.  GI: Soft. Bowel sounds are normal.  Musculoskeletal:  Left hip: She exhibits decreased range of motion, decreased strength, bony tenderness and crepitus.  Neurological: She is alert and oriented to person, place, and time.  Skin: Skin is warm and dry.  Psychiatric: She has a normal mood and affect.    Vital signs in last 24 hours: Temp:  [98.1 F (36.7 C)] 98.1 F (36.7 C) (10/10 1239) Pulse Rate:  [66] 66 (10/10 1239) Resp:  [20] 20 (10/10 1239) BP: (116)/(69) 116/69 mmHg (10/10 1239) SpO2:  [97 %] 97 % (10/10 1239)  Labs:   Estimated body mass index is 27.28 kg/(m^2) as calculated from the following:   Height as of 11/02/12: 5' 7.5" (1.715 m).   Weight as of 09/07/12: 80.241 kg (176 lb 14.4 oz).   Imaging Review Plain radiographs demonstrate severe degenerative joint disease of the left hip(s). The bone quality appears to be good for age and reported activity level.  Assessment/Plan:  End stage arthritis, left hip(s)  The patient history, physical examination, clinical judgement of the provider and imaging studies are consistent with end stage degenerative joint disease of the left hip(s) and total hip arthroplasty is deemed medically necessary. The treatment options including medical management, injection therapy, arthroscopy and arthroplasty were discussed at length. The risks and benefits of total hip arthroplasty were presented and reviewed. The risks due to aseptic loosening, infection, stiffness, dislocation/subluxation,  thromboembolic complications and other imponderables were discussed.  The patient acknowledged the explanation, agreed to proceed with the plan and consent was signed. Patient is being admitted for inpatient treatment for surgery, pain control, PT, OT, prophylactic antibiotics, VTE prophylaxis, progressive ambulation and ADL's and discharge  planning.The patient is planning to be discharged home with home health services

## 2012-11-06 LAB — BASIC METABOLIC PANEL
BUN: 7 mg/dL (ref 6–23)
CO2: 28 mEq/L (ref 19–32)
Calcium: 8.2 mg/dL — ABNORMAL LOW (ref 8.4–10.5)
Chloride: 103 mEq/L (ref 96–112)
Creatinine, Ser: 0.6 mg/dL (ref 0.50–1.10)
GFR calc Af Amer: >90 mL/min (ref >90)
GFR calc non Af Amer: 88 mL/min — ABNORMAL LOW (ref >90)
Glucose, Bld: 138 mg/dL — ABNORMAL HIGH (ref 70–99)
Potassium: 4 mEq/L (ref 3.5–5.1)
Sodium: 137 mEq/L (ref 135–145)

## 2012-11-06 LAB — CBC
HCT: 27.8 % — ABNORMAL LOW (ref 36.0–46.0)
Hemoglobin: 9.5 g/dL — ABNORMAL LOW (ref 12.0–15.0)
MCH: 32.9 pg (ref 26.0–34.0)
MCHC: 34.2 g/dL (ref 30.0–36.0)
MCV: 96.2 fL (ref 78.0–100.0)
Platelets: 142 10*3/uL — ABNORMAL LOW (ref 150–400)
RBC: 2.89 MIL/uL — ABNORMAL LOW (ref 3.87–5.11)
RDW: 13.7 % (ref 11.5–15.5)
WBC: 5.9 10*3/uL (ref 4.0–10.5)

## 2012-11-06 MED ORDER — SENNA 8.6 MG PO TABS
5.0000 | ORAL_TABLET | Freq: Two times a day (BID) | ORAL | Status: DC
  Filled 2012-11-06: qty 5

## 2012-11-06 MED ORDER — CEFAZOLIN SODIUM 1-5 GM-% IV SOLN
1.0000 g | Freq: Four times a day (QID) | INTRAVENOUS | Status: AC
  Administered 2012-11-06 (×2): 1 g via INTRAVENOUS
  Filled 2012-11-06 (×2): qty 50

## 2012-11-06 MED ORDER — SENNA 8.6 MG PO TABS
5.0000 | ORAL_TABLET | Freq: Two times a day (BID) | ORAL | Status: DC
  Administered 2012-11-06 – 2012-11-08 (×6): 43 mg via ORAL
  Filled 2012-11-06 (×7): qty 5

## 2012-11-06 MED ORDER — BIOTENE DRY MOUTH MT LIQD
15.0000 mL | Freq: Two times a day (BID) | OROMUCOSAL | Status: DC
  Administered 2012-11-06 – 2012-11-08 (×4): 15 mL via OROMUCOSAL

## 2012-11-06 NOTE — Plan of Care (Signed)
Problem: Phase I Progression Outcomes Goal: Dangle or out of bed evening of surgery Outcome: Not Met (add Reason) Pain not in control. Will try later today.

## 2012-11-06 NOTE — Op Note (Signed)
NAMEROSHA, COCKER NO.:  [id number]  MEDICAL RECORD NO.:  [id number]  LOCATION:  1611                         FACILITY:  [facility]  PHYSICIAN:  Vanita Panda. Magnus Ivan, M.D.DATE OF BIRTH:  07-12-1935  DATE OF PROCEDURE:  11/05/2012 DATE OF DISCHARGE:                              OPERATIVE REPORT   PREOPERATIVE DIAGNOSIS:  Severe end-stage osteoarthritis and degenerative joint disease, left hip.  POSTOPERATIVE DIAGNOSIS:  Severe end-stage osteoarthritis and degenerative joint disease, left hip.  PROCEDURE:  Left total hip arthroplasty through direct anterior approach.  IMPLANTS:  DePuy Sector Gription acetabular component size 52, size 36+ 4 neutral polyethylene liner, size 14 Corail femoral component with standard offset, size 36+ 5 metal hip ball.  SURGEON:  Vanita Panda. Magnus Ivan, MD  ASSISTANT:  Richardean Canal, PA-C  ANESTHESIA:  Spinal.  ANTIBIOTICS:  2 g of IV Ancef.  BLOOD LOSS:  350 mL.  COMPLICATIONS:  None.  INDICATIONS:  Ms. Fletchall is a very pleasant 77 year old female with debilitating arthritis of both of her hips.  She underwent a successful direct anterior right total hip arthroplasty back in August of this year which was just under 2 months.  She has debilitating arthritis of her left hip.  She has failed conservative treatment on this hip.  An x-ray showed complete loss of joint space, collapse of the femoral head, peritracheal osteophytes, and joint space narrowing.  At this point, with her daily pain, her poor mobility, and her poor quality of life, she wished to proceed with a left total hip arthroplasty.  Risks and benefits of the surgery were explained to her in detail and she did wish to proceed.  PROCEDURE IN DETAIL:  After informed consent was obtained, appropriate left hip was marked, she was brought to the operating room.  While she is on her stretcher, spinal anesthesia was obtained.  She was then laid in the supine  position, a Foley catheter was placed and then both feet had traction boots applied to them.  She was placed supine on the Hana fracture table with the perineal post in place and both legs in inline skeletal traction, but no traction applied.  The left operative hip was then prepped and draped with DuraPrep and sterile drapes.  A time-out was called.  She was identified as correct patient, correct left hip.  I then made an incision just inferior and posterior to the anterior superior iliac spine and carried this obliquely down the leg.  I dissected down to the tensor fascia lata muscle and then the tensor fascia lata longitudinally, I then proceeded with a direct anterior approach to the hip.  A Cobra retractor was placed around the lateral neck and around the medial neck.  I cauterized the lateral femoral circumflex vessels and then I opened up the joint capsule in an  L-type format placing in the joint, the Cobra retractors within the joint capsule.  I then made my femoral neck cut with the oscillating saw just proximal to lesser trochanter and completed this on osteotome.  I placed a corkscrew guide in the femoral head and removed the femoral head in its entirety and found it to be devoid of cartilage.  I then cleaned the acetabulum debris and placed a bent Hohmann medially obtained over the medial acetabular rim and a Cobra retractor laterally.  I then began reaming in 2 mm increments from a size 42 up to a size 51.  All reamers placed under direct visualization, the last reamer was placed under direct fluoroscopy.  I then placed the real DePuy Sector Gription acetabular component size 52.  The apex hole eliminator guide and the real 36+ 4 neutral polyethylene liner.  Attention was then turned to the femur with the leg externally rotated to 90 degrees, extended and adducted.  I was able to place a Mueller retractor medially and a Hohmann retractor behind the greater trochanter.  I  released the lateral joint capsule and then used a box cutting osteotome, then the femoral canal and a rongeur to lateralize.  I then began broaching from a size 8 broach all the way up to a size 14 broach.  Once the 14 broach was in place, and felt to be stable, I trialed a standard neck and a 36+ 1.5 hip ball.  We brought the leg back over and up with traction and internal rotation, reduced from the pelvis and it was stable, but I felt she was little short, so we decided to go with a 36+ 5 hip ball.  We then dislocated the hip and removed the trial components.  I placed the real Corail femoral component with standard offset, size 14 and the real 36+ 5 metal hip ball, we reduced this back in the acetabulum again it was stable and her leg lengths were measured equal.  We then copiously irrigated soft tissues with normal saline solution including the joint with pulsatile lavage.  We closed the joint capsule with interrupted #1 Ethibond suture followed by running #1 Vicryl in the tensor fascia, 0 Vicryl in the deep tissue, 2-0 Vicryl in subcutaneous tissue, 4-0 Monocryl subcuticular stitch, and Dermabond on the skin.  An Aquacel dressing was applied.  She was then taken off the Hana table and taken to the recovery room in stable condition.  All final counts correct. There were no complications noted.  Of note, Richardean Canal, PA-C was present in the entire case, his presence was crucial for patient positioning, exposure, retraction, assisting with implant placement and closure of the wound.     Vanita Panda. Magnus Ivan, M.D.     CYB/MEDQ  D:  11/05/2012  T:  11/06/2012  Job:  [id number]

## 2012-11-06 NOTE — Evaluation (Signed)
Physical Therapy Evaluation Patient Details Name: Zoe [name] MRN: [id number] DOB: 1935/03/08 Today's Date: 11/06/2012 Time: 7846-9629 PT Time Calculation (min): 16 min  PT Assessment / Plan / Recommendation History of Present Illness  s/p elective Lt THA --direct anterior  Clinical Impression  Pt will benefit from PT to address deficits below    PT Assessment  Patient needs continued PT services    Follow Up Recommendations  SNF    Does the patient have the potential to tolerate intense rehabilitation      Barriers to Discharge        Equipment Recommendations       Recommendations for Other Services     Frequency 7X/week    Precautions / Restrictions Precautions Precautions: Fall Restrictions Weight Bearing Restrictions: No Other Position/Activity Restrictions: WBAT   Pertinent Vitals/Pain sats 94% on RA      Mobility  Bed Mobility Bed Mobility: Sit to Supine Supine to Sit: 1: +2 Total assist;HOB elevated (20 degrees) Supine to Sit: Patient Percentage: 40% Sitting - Scoot to Edge of Bed: 4: Min assist Sit to Supine: 1: +2 Total assist Sit to Supine: Patient Percentage: 50% Details for Bed Mobility Assistance: verbal cues for technique Transfers Transfers: Sit to Stand;Stand to Sit Sit to Stand: 3: Mod assist Sit to Stand: Patient Percentage: 60% Stand to Sit: 3: Mod assist Stand to Sit: Patient Percentage: 60% Ambulation/Gait Ambulation/Gait Assistance: 4: Min assist;3: Mod assist Ambulation Distance (Feet): 6 Feet Assistive device: Rolling walker Ambulation/Gait Assistance Details: verbal cues for RW safety and sequence of task Gait Pattern: Step-to pattern Gait velocity: decr    Exercises     PT Diagnosis: Difficulty walking  PT Problem List: Decreased range of motion;Decreased strength;Decreased activity tolerance;Decreased balance;Decreased mobility PT Treatment Interventions: Gait training;DME instruction;Functional mobility  training;Therapeutic activities;Therapeutic exercise;Patient/family education     PT Goals(Current goals can be found in the care plan section) Acute Rehab PT Goals Patient Stated Goal: To go to [facility] if there is a private room PT Goal Formulation: With patient Time For Goal Achievement: 11/10/12 Potential to Achieve Goals: Good  Visit Information  Last PT Received On: 11/06/12 Assistance Needed: +2 History of Present Illness: s/p elective Lt THA --direct anterior       Prior Functioning  Home Living Family/patient expects to be discharged to:: Skilled nursing facility Living Arrangements: Spouse/significant other Available Help at Discharge: Family;Available 24 hours/day Type of Home: House Home Access: Stairs to enter [employer] of Steps: 3 Entrance Stairs-Rails: None Home Layout: One level Home Equipment: Adaptive equipment;Walker - 2 wheels;Walker - 4 wheels;Bedside commode;Tub bench Adaptive Equipment: Long-handled shoe horn;Sock aid;Long-handled sponge Prior Function Level of Independence: Independent with assistive device(s) ADL's / Homemaking Assistance Needed: husband would help her tie shoes; has tub shower and shower seat; states shower seat was too difficult getting in and out; was able to shower independently with increased time  Comments: using cane at home prior to this surgery; other hip done 6wks ago Communication Communication: No difficulties Dominant Hand: Right    Cognition  Cognition Arousal/Alertness: Awake/alert Behavior During Therapy: WFL for tasks assessed/performed Overall Cognitive Status: Within Functional Limits for tasks assessed    Extremity/Trunk Assessment Upper Extremity Assessment Upper Extremity Assessment: Defer to OT evaluation Lower Extremity Assessment Lower Extremity Assessment: LLE deficits/detail LLE Deficits / Details: AAROM limited at hip and knee to approx 65* flexion, srength 2+/5 at hip   Balance  Static Standing Balance Static Standing - Balance Support: Bilateral upper extremity supported;During  functional activity Static Standing - Level of Assistance: 4: Min assist  End of Session PT - End of Session Equipment Utilized During Treatment: Gait belt Activity Tolerance: Patient tolerated treatment well Patient left: in bed;with call bell/phone within reach Nurse Communication: Mobility status  GP     [facility] 11/06/2012, 1:03 PM

## 2012-11-06 NOTE — Progress Notes (Signed)
11/06/12 1500  PT Visit Information  Last PT Received On 11/06/12  PT Time Calculation  PT Start Time 1515  Subjective Data  Patient Stated Goal To go to [facility] : [facility] if there is a private room  Restrictions  Other Position/Activity Restrictions WBAT  Cognition  Arousal/Alertness Awake/alert  Behavior During Therapy WFL for tasks assessed/performed  Overall Cognitive Status Within Functional Limits for tasks assessed  Total Joint Exercises  Ankle Circles/Pumps AROM;Both;15 reps  Quad Sets AROM;Both;Strengthening;10 reps  Short Arc [employer];Left;15 reps  Heel Slides AROM;AAROM;10 reps;Left  Hip ABduction/ADduction AAROM;Left;10 reps  PT - End of Session  Activity Tolerance Patient tolerated treatment well  Patient left in bed;with call bell/phone within reach  Nurse Communication Mobility status  PT - Assessment/Plan  PT Plan Current plan remains appropriate  PT Frequency 7X/week  Follow Up Recommendations SNF  PT Goal Progression  Progress towards PT goals Progressing toward goals  Acute Rehab PT Goals  Time For Goal Achievement 11/10/12  Potential to Achieve Goals Good  PT General Charges  $$ ACUTE PT VISIT 1 Procedure  PT Treatments  $Therapeutic Exercise 8-22 mins

## 2012-11-06 NOTE — Progress Notes (Signed)
Subjective: 1 Day Post-Op Procedure(s) (LRB): LEFT TOTAL HIP ARTHROPLASTY ANTERIOR APPROACH (Left) Patient reports pain as severe.  States she takes 5 senokot in morning and at night.  Objective: Vital signs in last 24 hours: Temp:  [97.4 F (36.3 C)-98.3 F (36.8 C)] 98.3 F (36.8 C) (10/11 0541) Pulse Rate:  [52-78] 73 (10/11 0541) Resp:  [15-21] 18 (10/11 0541) BP: (98-131)/(48-80) 109/65 mmHg (10/11 0541) SpO2:  [96 %-100 %] 99 % (10/11 0541) Weight:  [77.168 kg (170 lb 2 oz)] 77.168 kg (170 lb 2 oz) (10/10 2026)  Intake/Output from previous day: 10/10 0701 - 10/11 0700 In: 3678 [I.V.:3678] Out: 1400 [Urine:1050; Blood:350] Intake/Output this shift:     Recent Labs  11/06/12 0508  HGB 9.5*    Recent Labs  11/06/12 0508  WBC 5.9  RBC 2.89*  HCT 27.8*  PLT 142*    Recent Labs  11/06/12 0508  NA 137  K 4.0  CL 103  CO2 28  BUN 7  CREATININE 0.60  GLUCOSE 138*  CALCIUM 8.2*   No results found for this basename: LABPT, INR,  in the last 72 hours  Left leg: Neurovascular intact Sensation intact distally Intact pulses distally Dorsiflexion/Plantar flexion intact Incision: dressing C/D/I Compartment soft  Assessment/Plan: 1 Day Post-Op Procedure(s) (LRB): LEFT TOTAL HIP ARTHROPLASTY ANTERIOR APPROACH (Left) Up with therapy Monitor for symptoms of anemia  Alcario Tinkey 11/06/2012, 10:48 AM

## 2012-11-06 NOTE — Progress Notes (Signed)
Clinical Social Work Department CLINICAL SOCIAL WORK PLACEMENT NOTE 11/06/2012  Patient:  Zoe [name], Zoe [name]  Account Number:  [id number] Admit date:  11/05/2012  Clinical Social Worker:  Leron Croak, CLINICAL SOCIAL WORKER  Date/time:  11/06/2012 05:48 PM  Clinical Social Work is seeking post-discharge placement for this patient at the following level of care:   SKILLED NURSING   (*CSW will update this form in Epic as items are completed)   11/06/2012  Patient/family provided with [facility] Department of Clinical Social Work's list of facilities offering this level of care within the geographic area requested by the patient (or if unable, by the patient's family).  11/06/2012  Patient/family informed of their freedom to choose among providers that offer the needed level of care, that participate in Medicare, Medicaid or managed care program needed by the patient, have an available bed and are willing to accept the patient.  11/06/2012  Patient/family informed of [facility]' ownership interest in [facility], as well as of the fact that they are under no obligation to receive care at this facility.  PASARR submitted to EDS on 11/06/2012 PASARR number received from EDS on 11/06/2012  FL2 transmitted to all facilities in geographic area requested by pt/family on  11/06/2012 FL2 transmitted to all facilities within larger geographic area on 11/06/2012  Patient informed that his/her managed care company has contracts with or will negotiate with  certain facilities, including the following:     Patient/family informed of bed offers received:   Patient chooses bed at  Physician recommends and patient chooses bed at    Patient to be transferred to  on   Patient to be transferred to facility by   The following physician request were entered in Epic:   Additional Comments: Leron Croak, [facility]  [phone]

## 2012-11-06 NOTE — Progress Notes (Signed)
Clinical Social Work Department BRIEF PSYCHOSOCIAL ASSESSMENT 11/06/2012  Patient:  Zoe [name], Zoe [name]     Account Number:  [id number]     Admit date:  11/05/2012  Clinical Social Worker:  Leron Croak, CLINICAL SOCIAL WORKER  Date/Time:  11/06/2012 03:32 PM  Referred by:  Physician  Date Referred:  11/05/2012 Referred for  SNF Placement   Other Referral:   Interview type:  Patient Other interview type:   Pt's son and daughter were at the bedisde    PSYCHOSOCIAL DATA Living Status:  FAMILY Admitted from facility:   Level of care:   Primary support name:  Zoe [name]  [phone] Primary support relationship to patient:  SPOUSE Degree of support available:   Pt has a good support system from family and friends    CURRENT CONCERNS Current Concerns  Post-Acute Placement   Other Concerns:    SOCIAL WORK ASSESSMENT / PLAN CSW met with the Pt and family at the bedside. CSW introduced self and reason for visit. Pt was aware that she would be needing SNF placement again. Pt stated that she "just broke her hip 6 weeks ago" and that she "does agree to go if she can go to [employer] Pt stated that she "visited [address] and talked with Jasmine December". Pt would like a private room and stated that if she could not get one then she would "prefer to go home with HHPT".    CSW will assist with d/c planning and has ermission from the Pt to send information to [employer] area.   Assessment/plan status:  Information/Referral to [facility] Other assessment/ plan:   Information/referral to community resources:   CSW to provide a SNF listing to Pt.    PATIENT'S/FAMILY'S RESPONSE TO PLAN OF CARE: Pt and family appreciative for assistance with d/c planning and SNF search.      Leron Croak, LCSWA [facility] Emergency Dept.  [phone]

## 2012-11-06 NOTE — Evaluation (Signed)
Occupational Therapy Evaluation Patient Details Name: Zoe [name] MRN: [id number] DOB: 02-Mar-1935 Today's Date: 11/06/2012 Time: 4098-1191 OT Time Calculation (min): 34 min  OT Assessment / Plan / Recommendation History of present illness s/p elective Lt THA --direct anterior   Clinical Impression   This 77 yo female s/p above and had other hip done 6 weeks ago presents to acute OT with problems below. Will benefit from acute OT with follow up at SNF.    OT Assessment  Patient needs continued OT Services    Follow Up Recommendations  SNF       Equipment Recommendations  None recommended by OT       Frequency  Min 2X/week    Precautions / Restrictions Precautions Precautions: Fall Restrictions Weight Bearing Restrictions: No   Pertinent Vitals/Pain 7/10 left hip; pre-medicated    ADL  Eating/Feeding: Independent Where Assessed - Eating/Feeding: Chair Grooming: Set up Where Assessed - Grooming: Supported sitting Upper Body Bathing: Set up Where Assessed - Upper Body Bathing: Supported sitting Lower Body Bathing: +1 Total assistance Where Assessed - Lower Body Bathing: Supported sit to stand Upper Body Dressing: Minimal assistance Where Assessed - Upper Body Dressing: Supported sitting Lower Body Dressing: +1 Total assistance Where Assessed - Lower Body Dressing: Supported sit to Pharmacist, hospital: +2 Total assistance Toilet Transfer: Patient Percentage: 60% Statistician Method: Sit to stand;Stand pivot Acupuncturist:  (Bed>recliner about 3 feet away) Toileting - Architect and Hygiene: Moderate assistance Where Assessed - Toileting Clothing Manipulation and Hygiene: Standing Equipment Used: Rolling walker;Gait belt Transfers/Ambulation Related to ADLs: Mod A sit<>stand, min A for ambulation with RW    OT Diagnosis: Generalized weakness;Acute pain  OT Problem List: Decreased strength;Pain;Impaired balance (sitting and/or  standing) OT Treatment Interventions: Self-care/ADL training;Balance training;DME and/or AE instruction;Patient/family education   OT Goals(Current goals can be found in the care plan section) Acute Rehab OT Goals Patient Stated Goal: To go to [facility] OT Goal Formulation: With patient Time For Goal Achievement: 11/13/12 Potential to Achieve Goals: Good  Visit Information  Last OT Received On: 11/06/12 Assistance Needed: +2 History of Present Illness: s/p elective Lt THA --direct anterior       Prior Functioning     Home Living Family/patient expects to be discharged to:: Skilled nursing facility [facility]) Living Arrangements: Spouse/significant other Available Help at Discharge: Family;Available 24 hours/day Type of Home: House Home Access: Stairs to enter [employer] of Steps: 3 Entrance Stairs-Rails: None Home Layout: One level Home Equipment: Adaptive equipment;Walker - 2 wheels;Walker - 4 wheels;Bedside commode;Tub bench Adaptive Equipment: Long-handled shoe horn;Sock aid;Long-handled sponge Prior Function Level of Independence: Needs assistance ADL's / Homemaking Assistance Needed: husband would help her tie shoes; has tub shower and shower seat; states shower seat was too difficult getting in and out; was able to shower independently with increased time  Communication Communication: No difficulties Dominant Hand: Right         Vision/Perception Vision - History Patient Visual Report: No change from baseline   Cognition  Cognition Arousal/Alertness: Awake/alert Behavior During Therapy: WFL for tasks assessed/performed Overall Cognitive Status: Within Functional Limits for tasks assessed    Extremity/Trunk Assessment Upper Extremity Assessment Upper Extremity Assessment: Overall WFL for tasks assessed     Mobility Bed Mobility Bed Mobility: Supine to Sit;Sitting - Scoot to Edge of Bed Supine to Sit: 1: +2 Total assist;HOB elevated (20  degrees) Supine to Sit: Patient Percentage: 40% Sitting - Scoot to Edge of Bed: 4:  Min assist Transfers Transfers: Sit to Stand;Stand to Sit Sit to Stand: 1: +2 Total assist;With upper extremity assist;From elevated surface;From bed Sit to Stand: Patient Percentage: 60% Stand to Sit: 1: +2 Total assist;With upper extremity assist;With armrests;To chair/3-in-1 Stand to Sit: Patient Percentage: 60%           End of Session OT - End of Session Equipment Utilized During Treatment: Gait belt;Rolling walker Activity Tolerance: Patient limited by fatigue;Patient limited by pain Patient left: in chair;with call bell/phone within reach;with family/visitor present Nurse Communication:  (Nurse helped me get pt up)       Evette Georges [phone] 11/06/2012, 11:48 AM

## 2012-11-07 LAB — CBC
HCT: 26.9 % — ABNORMAL LOW (ref 36.0–46.0)
Hemoglobin: 9.1 g/dL — ABNORMAL LOW (ref 12.0–15.0)
MCH: 32.4 pg (ref 26.0–34.0)
MCHC: 33.8 g/dL (ref 30.0–36.0)
MCV: 95.7 fL (ref 78.0–100.0)
Platelets: 134 10*3/uL — ABNORMAL LOW (ref 150–400)
RBC: 2.81 MIL/uL — ABNORMAL LOW (ref 3.87–5.11)
RDW: 13.4 % (ref 11.5–15.5)
WBC: 8.2 10*3/uL (ref 4.0–10.5)

## 2012-11-07 MED ORDER — SODIUM CHLORIDE 0.9 % IV SOLN: INTRAVENOUS | Status: DC

## 2012-11-07 NOTE — Progress Notes (Signed)
Report received from Paul Mostellar, RN. No change from initial pm assessment. Will continue to follow the plan of care.  

## 2012-11-07 NOTE — Progress Notes (Signed)
Physical Therapy Treatment Patient Details Name: Zoe [name] MRN: [id number] DOB: 10-Jun-1935 Today's Date: 11/07/2012 Time: 4098-1191 PT Time Calculation (min): 23 min  PT Assessment / Plan / Recommendation  History of Present Illness s/p elective Lt THA --direct anterior   PT Comments   Pt will benefit from SNF post acute   Follow Up Recommendations  SNF     Does the patient have the potential to tolerate intense rehabilitation     Barriers to Discharge        Equipment Recommendations  None recommended by PT    Recommendations for Other Services    Frequency 7X/week   Progress towards PT Goals Progress towards PT goals: Progressing toward goals  Plan Current plan remains appropriate    Precautions / Restrictions Precautions Precautions: Fall Restrictions Other Position/Activity Restrictions: WBAT   Pertinent Vitals/Pain     Mobility  Bed Mobility Bed Mobility: Sit to Supine Sit to Supine: 1: +2 Total assist Sit to Supine: Patient Percentage: 60% Details for Bed Mobility Assistance: verbal cues for technique Transfers Transfers: Sit to Stand;Stand to Sit Sit to Stand: 1: +2 Total assist;3: Mod assist;From chair/3-in-1;With upper extremity assist Sit to Stand: Patient Percentage: 50% Stand to Sit: 3: Mod assist;To chair/3-in-1;With upper extremity assist Details for Transfer Assistance: verbal cues for hand placement, LE position and control of descent; varied levels of assist during session Ambulation/Gait Ambulation/Gait Assistance: 4: Min assist;3: Mod assist Ambulation Distance (Feet): 15 Feet (times 2) Assistive device: Rolling walker Ambulation/Gait Assistance Details: verbal cues for step length and RW safety Gait Pattern: Step-to pattern;Antalgic Gait velocity: decr    Exercises Total Joint Exercises Ankle Circles/Pumps: AROM;Both;15 reps Quad Sets: AROM;Both;Strengthening;10 reps Heel Slides: AROM;AAROM;10 reps;Left   PT Diagnosis:    PT  Problem List:   PT Treatment Interventions:     PT Goals (current goals can now be found in the care plan section) Acute Rehab PT Goals Patient Stated Goal: To go to [facility] if there is a private room PT Goal Formulation: With patient Time For Goal Achievement: 11/10/12 Potential to Achieve Goals: Good  Visit Information  Last PT Received On: 11/07/12 Assistance Needed: +2 History of Present Illness: s/p elective Lt THA --direct anterior    Subjective Data  Patient Stated Goal: To go to [facility] if there is a Engineer, structural Arousal/Alertness: Awake/alert Behavior During Therapy: WFL for tasks assessed/performed Overall Cognitive Status: Within Functional Limits for tasks assessed    Balance     End of Session PT - End of Session Equipment Utilized During Treatment: Gait belt Activity Tolerance: Patient limited by fatigue;Patient limited by pain Patient left: in bed;with call bell/phone within reach;with family/visitor present Nurse Communication: Mobility status   GP     [facility] 11/07/2012, 5:25 PM

## 2012-11-07 NOTE — Progress Notes (Signed)
Physical Therapy Treatment Patient Details Name: Zoe [name] MRN: [id number] DOB: [birth year] Today's Date: 11/07/2012 Time: 4782-9562 PT Time Calculation (min): 27 min  PT Assessment / Plan / Recommendation  History of Present Illness s/p elective Lt THA --direct anterior   PT Comments   Pt progressing slowly; She is still moving very slowly, still with pain issues but better today per pt;  Will benefit from SNF placement  Follow Up Recommendations  SNF     Does the patient have the potential to tolerate intense rehabilitation     Barriers to Discharge        Equipment Recommendations  None recommended by PT    Recommendations for Other Services    Frequency 7X/week   Progress towards PT Goals Progress towards PT goals: Progressing toward goals  Plan Current plan remains appropriate    Precautions / Restrictions Precautions Precautions: Fall Restrictions Other Position/Activity Restrictions: WBAT   Pertinent Vitals/Pain     Mobility  Bed Mobility Bed Mobility: Sit to Supine Supine to Sit: 1: +2 Total assist;HOB elevated Supine to Sit: Patient Percentage: 50% Sitting - Scoot to Edge of Bed: 3: Mod assist Details for Bed Mobility Assistance: verbal cues for technique Transfers Sit to Stand: 1: +2 Total assist Sit to Stand: Patient Percentage: 50% Stand to Sit: 2: Max assist;3: Mod assist;To chair/3-in-1 Details for Transfer Assistance: verbal cues for hand placement, LE position and control of descent Ambulation/Gait Ambulation/Gait Assistance: 4: Min assist;3: Mod assist Ambulation Distance (Feet): 10 Feet (times 2) Assistive device: Rolling walker Ambulation/Gait Assistance Details: verbal cues for sequence and assist intermittently to advance RLE Gait Pattern: Step-to pattern;Antalgic Gait velocity: decr    Exercises     PT Diagnosis:    PT Problem List:   PT Treatment Interventions:     PT Goals (current goals can now be found in the care plan  section) Acute Rehab PT Goals Patient Stated Goal: To go to [facility] if there is a private room Potential to Achieve Goals: Good  Visit Information  Last PT Received On: 11/07/12 Assistance Needed: +2 History of Present Illness: s/p elective Lt THA --direct anterior    Subjective Data  Patient Stated Goal: To go to [facility] if there is a Engineer, structural Arousal/Alertness: Awake/alert Behavior During Therapy: WFL for tasks assessed/performed Overall Cognitive Status: Within Functional Limits for tasks assessed    Balance     End of Session PT - End of Session Equipment Utilized During Treatment: Gait belt Activity Tolerance: Patient tolerated treatment well Patient left: in chair;with call bell/phone within reach;with family/visitor present Nurse Communication: Mobility status   GP     [facility] 11/07/2012, 2:09 PM

## 2012-11-07 NOTE — Progress Notes (Signed)
Subjective: 2 Days Post-Op Procedure(s) (LRB): LEFT TOTAL HIP ARTHROPLASTY ANTERIOR APPROACH (Left) Patient reports pain as moderate.  Slow mobility with PT.  Acute blood loss anemia, but asymptomatic.  Objective: Vital signs in last 24 hours: Temp:  [98.9 F (37.2 C)-100.7 F (38.2 C)] 100.3 F (37.9 C) (10/12 0500) Pulse Rate:  [68-75] 75 (10/12 0500) Resp:  [16-20] 16 (10/12 0500) BP: (97-122)/(45-62) 103/45 mmHg (10/12 0500) SpO2:  [92 %-100 %] 96 % (10/12 0500)  Intake/Output from previous day: 10/11 0701 - 10/12 0700 In: 1604.5 [P.O.:840; I.V.:764.5] Out: 2400 [Urine:2400] Intake/Output this shift: Total I/O In: -  Out: 175 [Urine:175]   Recent Labs  11/06/12 0508 11/07/12 0518  HGB 9.5* 9.1*    Recent Labs  11/06/12 0508 11/07/12 0518  WBC 5.9 8.2  RBC 2.89* 2.81*  HCT 27.8* 26.9*  PLT 142* 134*    Recent Labs  11/06/12 0508  NA 137  K 4.0  CL 103  CO2 28  BUN 7  CREATININE 0.60  GLUCOSE 138*  CALCIUM 8.2*   No results found for this basename: LABPT, INR,  in the last 72 hours  Sensation intact distally Intact pulses distally Dorsiflexion/Plantar flexion intact Incision: scant drainage  Assessment/Plan: 2 Days Post-Op Procedure(s) (LRB): LEFT TOTAL HIP ARTHROPLASTY ANTERIOR APPROACH (Left) Up with therapy D/C to home vs SNF on Tuesday.  Justn Quale Y 11/07/2012, 9:53 AM

## 2012-11-08 ENCOUNTER — Encounter (HOSPITAL_COMMUNITY): Payer: Self-pay | Admitting: Orthopaedic Surgery

## 2012-11-08 LAB — CBC
HCT: 25.6 % — ABNORMAL LOW (ref 36.0–46.0)
Hemoglobin: 8.8 g/dL — ABNORMAL LOW (ref 12.0–15.0)
MCH: 32.6 pg (ref 26.0–34.0)
MCHC: 34.4 g/dL (ref 30.0–36.0)
MCV: 94.8 fL (ref 78.0–100.0)
Platelets: 144 10*3/uL — ABNORMAL LOW (ref 150–400)
RBC: 2.7 MIL/uL — ABNORMAL LOW (ref 3.87–5.11)
RDW: 13.5 % (ref 11.5–15.5)
WBC: 8.1 10*3/uL (ref 4.0–10.5)

## 2012-11-08 MED ORDER — ASPIRIN 325 MG PO TBEC: 325.0000 mg | DELAYED_RELEASE_TABLET | Freq: Two times a day (BID) | ORAL | Status: DC

## 2012-11-08 MED ORDER — HYDROCODONE-ACETAMINOPHEN 5-325 MG PO TABS: 1.0000 | ORAL_TABLET | ORAL | Status: DC | PRN

## 2012-11-08 NOTE — Progress Notes (Signed)
Occupational Therapy Treatment Patient Details Name: Zoe [name] MRN: [id number] DOB: 1935-02-12 Today's Date: 11/08/2012 Time: 4098-1191 OT Time Calculation (min): 26 min  OT Assessment / Plan / Recommendation  History of present illness s/p elective Lt THA --direct anterior   OT comments  Pt making slow progress towards functional goals. Pt seemed confused this morning initially at start of session but became mor alert and lucid with prep for chair mobility in prep for sit - stand. Pt required increased time to complete ADL and ADL mobility tasks and required multiple cues for safety and technique  Follow Up Recommendations  SNF    Barriers to Discharge       Equipment Recommendations  None recommended by OT    Recommendations for Other Services    Frequency Min 2X/week   Progress towards OT Goals Progress towards OT goals: Progressing toward goals  Plan Discharge plan remains appropriate    Precautions / Restrictions Precautions Precautions: Fall Restrictions Weight Bearing Restrictions: No Other Position/Activity Restrictions: WBAT   Pertinent Vitals/Pain 5/10     ADL  Grooming: Performed;Wash/dry hands;Wash/dry face;Minimal assistance Where Assessed - Grooming: Supported standing Toilet Transfer: Performed;+1 Total Dentist Method: Sit to Barista: Raised toilet seat with arms (or 3-in-1 over toilet);Grab bars Toileting - Clothing Manipulation and Hygiene: Performed;Moderate assistance Where Assessed - Toileting Clothing Manipulation and Hygiene: Standing Equipment Used: Rolling walker;Gait belt Transfers/Ambulation Related to ADLs: verbal cues for hand placement, LE position and control of descent; varied levels of assist during session, multiple cues to side step towards toilet ADL Comments: Pt required increased time to complete ADL and ADL mobility tasks due to pain and some confusion    OT Diagnosis:    OT  Problem List:   OT Treatment Interventions:     OT Goals(current goals can now be found in the care plan section) Acute Rehab OT Goals Patient Stated Goal: To go to [facility] if there is a private room  Visit Information  Last OT Received On: 11/08/12 Assistance Needed: +1 History of Present Illness: s/p elective Lt THA --direct anterior    Subjective Data      Prior Functioning       Cognition  Cognition Arousal/Alertness: Awake/alert;Lethargic (initially lethargic) Behavior During Therapy: WFL for tasks assessed/performed Overall Cognitive Status: Impaired/Different from baseline Area of Impairment: Memory;Following commands;Safety/judgement Following Commands: Follows one step commands inconsistently Safety/Judgement: Decreased awareness of deficits General Comments: pt requiring incr time and repetition of one step functional commands today    Mobility  Bed Mobility Bed Mobility: Not assessed Supine to Sit: 4: Min assist;3: Mod assist Sitting - Scoot to Edge of Bed: 4: Min assist Details for Bed Mobility Assistance: pt up in recliner and was not able to rememmber if PT or nursing staff assisted her into chair earlier this morning Transfers Transfers: Sit to Stand;Stand to Sit Sit to Stand: With upper extremity assist;From chair/3-in-1;From toilet;1: +1 Total assist Sit to Stand: Patient Percentage: 60% Stand to Sit: 3: Mod assist;To chair/3-in-1;With upper extremity assist;To toilet Details for Transfer Assistance: verbal cues for hand placement, LE position and control of descent; varied levels of assist during session       Balance Balance Balance Assessed: Yes Dynamic Standing Balance Dynamic Standing - Balance Support: Left upper extremity supported;During functional activity Dynamic Standing - Level of Assistance: 4: Min assist   End of Session OT - End of Session Equipment Utilized During Treatment: Gait belt;Rolling walker Activity Tolerance: Patient  limited by fatigue;Patient limited by pain Patient left: in chair;with call bell/phone within reach  GO     Galen Manila 11/08/2012, 1:29 PM

## 2012-11-08 NOTE — Progress Notes (Signed)
Subjective: 3 Days Post-Op Procedure(s) (LRB): LEFT TOTAL HIP ARTHROPLASTY ANTERIOR APPROACH (Left) Patient reports pain as moderate.  Slow progress with PT.  Objective: Vital signs in last 24 hours: Temp:  [98 F (36.7 C)-99.9 F (37.7 C)] 99.4 F (37.4 C) (10/13 0500) Pulse Rate:  [66-78] 78 (10/13 0500) Resp:  [16-18] 18 (10/13 0500) BP: (96-109)/(60-68) 107/66 mmHg (10/13 0500) SpO2:  [93 %-96 %] 96 % (10/13 0500)  Intake/Output from previous day: 10/12 0701 - 10/13 0700 In: 780 [P.O.:480; I.V.:300] Out: 1075 [Urine:1075] Intake/Output this shift:     Recent Labs  11/06/12 0508 11/07/12 0518 11/08/12 0500  HGB 9.5* 9.1* 8.8*    Recent Labs  11/07/12 0518 11/08/12 0500  WBC 8.2 8.1  RBC 2.81* 2.70*  HCT 26.9* 25.6*  PLT 134* 144*    Recent Labs  11/06/12 0508  NA 137  K 4.0  CL 103  CO2 28  BUN 7  CREATININE 0.60  GLUCOSE 138*  CALCIUM 8.2*   No results found for this basename: LABPT, INR,  in the last 72 hours Left leg: Neurovascular intact Sensation intact distally Intact pulses distally Dorsiflexion/Plantar flexion intact Incision: dressing C/D/I Compartment soft  Assessment/Plan: 3 Days Post-Op Procedure(s) (LRB): LEFT TOTAL HIP ARTHROPLASTY ANTERIOR APPROACH (Left) Up with therapy Discharge to SNF when bed available  Richardean Canal 11/08/2012, 7:51 AM

## 2012-11-08 NOTE — Progress Notes (Signed)
Patient has a private room available @ [facility] when ready for discharge - anticipating tomorrow. FL2 signed - CSW will follow-up in the morning.   Clinical Social Work Department CLINICAL SOCIAL WORK PLACEMENT NOTE 11/08/2012  Patient:  Zoe [name], Zoe [name]  Account Number:  [id number] Admit date:  11/05/2012  Clinical Social Worker:  Leron Croak, CLINICAL SOCIAL WORKER  Date/time:  11/06/2012 05:48 PM  Clinical Social Work is seeking post-discharge placement for this patient at the following level of care:   SKILLED NURSING   (*CSW will update this form in Epic as items are completed)   11/06/2012  Patient/family provided with [facility] Department of Clinical Social Work's list of facilities offering this level of care within the geographic area requested by the patient (or if unable, by the patient's family).  11/06/2012  Patient/family informed of their freedom to choose among providers that offer the needed level of care, that participate in Medicare, Medicaid or managed care program needed by the patient, have an available bed and are willing to accept the patient.  11/06/2012  Patient/family informed of [facility]' ownership interest in [facility], as well as of the fact that they are under no obligation to receive care at this facility.  PASARR submitted to EDS on 11/06/2012 PASARR number received from EDS on 11/06/2012  FL2 transmitted to all facilities in geographic area requested by pt/family on  11/06/2012 FL2 transmitted to all facilities within larger geographic area on 11/06/2012  Patient informed that his/her managed care company has contracts with or will negotiate with  certain facilities, including the following:     Patient/family informed of bed offers received:  11/08/2012 Patient chooses bed at [facility] Physician recommends and patient chooses bed at    Patient to be transferred to [facility] on   Patient to be transferred to  facility by   The following physician request were entered in Epic:   Additional Comments:   Unice Bailey, LCSW [facility] Clinical Social Worker cell #: [phone]

## 2012-11-09 NOTE — Progress Notes (Signed)
Subjective: 4 Days Post-Op Procedure(s) (LRB): LEFT TOTAL HIP ARTHROPLASTY ANTERIOR APPROACH (Left) Patient reports pain as moderate.   Very slow progress with PT. Objective: Vital signs in last 24 hours: Temp:  [99.4 F (37.4 C)-99.8 F (37.7 C)] 99.4 F (37.4 C) (10/14 0649) Pulse Rate:  [66-75] 66 (10/14 0649) Resp:  [16-20] 18 (10/14 0649) BP: (99-112)/(54-63) 99/63 mmHg (10/14 0649) SpO2:  [93 %-97 %] 93 % (10/14 0649)  Intake/Output from previous day: 10/13 0701 - 10/14 0700 In: 720 [P.O.:720] Out: 1250 [Urine:1250] Intake/Output this shift: Total I/O In: -  Out: 250 [Urine:250]   Recent Labs  11/07/12 0518 11/08/12 0500  HGB 9.1* 8.8*    Recent Labs  11/07/12 0518 11/08/12 0500  WBC 8.2 8.1  RBC 2.81* 2.70*  HCT 26.9* 25.6*  PLT 134* 144*   No results found for this basename: NA, K, CL, CO2, BUN, CREATININE, GLUCOSE, CALCIUM,  in the last 72 hours No results found for this basename: LABPT, INR,  in the last 72 hours  Left lower extremity: Neurovascular intact Sensation intact distally Intact pulses distally Dorsiflexion/Plantar flexion intact Incision: dressing C/D/I No cellulitis present  Assessment/Plan: 4 Days Post-Op Procedure(s) (LRB): LEFT TOTAL HIP ARTHROPLASTY ANTERIOR APPROACH (Left) Discharge to [facility], Kaymon Denomme 11/09/2012, 8:07 AM

## 2012-11-09 NOTE — Progress Notes (Signed)
Physical Therapy Treatment Patient Details Name: Zoe [name] MRN: [id number] DOB: Nov 26, 1935 Today's Date: 11/09/2012 Time: 6295-2841 PT Time Calculation (min): 29 min  PT Assessment / Plan / Recommendation  History of Present Illness s/p elective Lt THA --direct anterior   PT Comments   Assisted pt OOB to amb to BR.  Pt required increased time and 75% VC's on proper gait sequencing and safety with turns/backward gait.  Amb pt in hallway.  Pt plans to D/C to SNF for ST Rehab.    Follow Up Recommendations  SNF     Does the patient have the potential to tolerate intense rehabilitation     Barriers to Discharge        Equipment Recommendations       Recommendations for Other Services    Frequency 7X/week   Progress towards PT Goals Progress towards PT goals: Progressing toward goals  Plan      Precautions / Restrictions Precautions Precautions: Fall Restrictions Weight Bearing Restrictions: No Other Position/Activity Restrictions: WBAT    Pertinent Vitals/Pain C/o 5/10 during act ICE applied    Mobility  Bed Mobility Bed Mobility: Supine to Sit Supine to Sit: 4: Min guard Details for Bed Mobility Assistance: increased time Transfers Transfers: Sit to Stand;Stand to Sit Sit to Stand: With upper extremity assist;From chair/3-in-1;From toilet;4: Min assist;3: Mod assist Stand to Sit: 4: Min assist;3: Mod assist;To toilet;To chair/3-in-1 Details for Transfer Assistance: 75% VC's on proper hand placement and safety with turns.  Also required increased time and nearly step by step instructions.  Ambulation/Gait Ambulation/Gait Assistance: 4: Min assist;3: Mod assist Ambulation Distance (Feet): 52 Feet Assistive device: Rolling walker Ambulation/Gait Assistance Details: 75% VC's on proper sequencing and proper walker to self distance.  Pt also required increased time.   Gait Pattern: Step-to pattern;Antalgic;Decreased step length - right;Decreased step length -  left;Trunk flexed Gait velocity: decr     PT Goals (current goals can now be found in the care plan section)    Visit Information  Last PT Received On: 11/09/12 Assistance Needed: +1 History of Present Illness: s/p elective Lt THA --direct anterior    Subjective Data      Cognition       Balance     End of Session PT - End of Session Equipment Utilized During Treatment: Gait belt Activity Tolerance: Patient limited by fatigue Patient left: in chair;with call bell/phone within reach   Felecia Shelling  PTA [facility]  Acute  Rehab Pager      [phone]

## 2012-11-09 NOTE — Progress Notes (Signed)
Patient is set to discharge to [facility] today. Patient & husband, Fayrene Fearing aware. Discharge packet at nurses station - RN, Bonita Quin aware. Husband to transport to facility.   Clinical Social Work Department CLINICAL SOCIAL WORK PLACEMENT NOTE 11/09/2012  Patient:  LAMOINE, FREDRICKSEN  Account Number:  [id number] Admit date:  11/05/2012  Clinical Social Worker:  Leron Croak, CLINICAL SOCIAL WORKER  Date/time:  11/06/2012 05:48 PM  Clinical Social Work is seeking post-discharge placement for this patient at the following level of care:   SKILLED NURSING   (*CSW will update this form in Epic as items are completed)   11/06/2012  Patient/family provided with [facility] Department of Clinical Social Work's list of facilities offering this level of care within the geographic area requested by the patient (or if unable, by the patient's family).  11/06/2012  Patient/family informed of their freedom to choose among providers that offer the needed level of care, that participate in Medicare, Medicaid or managed care program needed by the patient, have an available bed and are willing to accept the patient.  11/06/2012  Patient/family informed of [facility]' ownership interest in [facility], as well as of the fact that they are under no obligation to receive care at this facility.  PASARR submitted to EDS on 11/06/2012 PASARR number received from EDS on 11/06/2012  FL2 transmitted to all facilities in geographic area requested by pt/family on  11/06/2012 FL2 transmitted to all facilities within larger geographic area on 11/06/2012  Patient informed that his/her managed care company has contracts with or will negotiate with  certain facilities, including the following:     Patient/family informed of bed offers received:  11/08/2012 Patient chooses bed at [facility] Physician recommends and patient chooses bed at    Patient to be transferred to [facility] on   11/09/2012 Patient to be transferred to facility by husband's car  The following physician request were entered in Epic:   Additional Comments:   Unice Bailey, LCSW [facility] Clinical Social Worker cell #: [phone]

## 2012-11-09 NOTE — Progress Notes (Signed)
Pt incont of urine frequently with urgency.  Bladder scan shows 600cc after incont episode.  I&O cathed for 600.

## 2012-11-09 NOTE — Discharge Summary (Signed)
Patient ID: Zoe [name] MRN: [id number] DOB/AGE: 1935-06-03 77 y.o.  Admit date: 11/05/2012 Discharge date: 11/09/2012  Admission Diagnoses:  Principal Problem:   Degenerative arthritis of hip, left   Discharge Diagnoses:  S/p Left hip arthroplasty direct anterior approach Post-op anemia asymtomatic  Past Medical History  Diagnosis Date  . GERD (gastroesophageal reflux disease)   . Chronic constipation   . Cough   . Nausea   . Hemorrhoids   . High cholesterol   . Asthma   . Environmental allergies   . Urgency of urination   . Arthritis   . Hypercholesteremia   . Hypertension   . Dysrhythmia     tachycardia  . Anxiety   . Depression   . Pneumonia     7+ YRS  . Fibromyalgia   . Cancer     skin CA  . Abdominal discomfort 11-02-12    suspected ?UTI- PCP MD placed on Cipro    Surgeries: Procedure(s): LEFT TOTAL HIP ARTHROPLASTY ANTERIOR APPROACH on 11/05/2012   Consultants:  Physical Therapy  Discharged Condition: Improved  Hospital Course: Zoe [name] is an 77 y.o. female who was admitted 11/05/2012 for operative treatment ofDegenerative arthritis of hip. Patient has severe unremitting pain that affects sleep, daily activities, and work/hobbies. After pre-op clearance the patient was taken to the operating room on 11/05/2012 and underwent  Procedure(s): LEFT TOTAL HIP ARTHROPLASTY ANTERIOR APPROACH.    Patient was given perioperative antibiotics: Anti-infectives   Start     Dose/Rate Route Frequency Ordered Stop   11/06/12 2230  ceFAZolin (ANCEF) IVPB 1 g/50 mL premix  Status:  Discontinued     1 g 100 mL/hr over 30 Minutes Intravenous Every 6 hours 11/05/12 2009 11/06/12 0025   11/06/12 0030  ceFAZolin (ANCEF) IVPB 1 g/50 mL premix     1 g 100 mL/hr over 30 Minutes Intravenous Every 6 hours 11/06/12 0026 11/06/12 0610   11/05/12 2200  quiNINE (QUALAQUIN) capsule 324 mg  Status:  Discontinued     324 mg Oral Daily at bedtime 11/05/12 2009 11/05/12  2013   11/05/12 1300  ceFAZolin (ANCEF) IVPB 2 g/50 mL premix     2 g 100 mL/hr over 30 Minutes Intravenous On call to O.R. 11/05/12 1248 11/05/12 1635       Patient was given sequential compression devices, early ambulation, and chemoprophylaxis to prevent DVT.  Patient benefited maximally from hospital stay and there were no complications.    Recent vital signs: Patient Vitals for the past 24 hrs:  BP Temp Temp src Pulse Resp SpO2  11/09/12 0649 99/63 mmHg 99.4 F (37.4 C) Oral 66 18 93 %  11/09/12 0400 - - - - 16 97 %  11/09/12 0000 - - - - 16 97 %  11/08/12 2216 112/58 mmHg 99.8 F (37.7 C) Oral 72 18 95 %  11/08/12 2000 - - - - 20 94 %  11/08/12 1600 - - - - 18 94 %  11/08/12 1415 99/54 mmHg 99.6 F (37.6 C) - 70 18 94 %  11/08/12 1200 - - - - 18 96 %  11/08/12 0940 - - - 75 - -     Recent laboratory studies:  Recent Labs  11/07/12 0518 11/08/12 0500  WBC 8.2 8.1  HGB 9.1* 8.8*  HCT 26.9* 25.6*  PLT 134* 144*     Discharge Medications:     Medication List    STOP taking these medications       traMADol  50 MG tablet  Commonly known as:  ULTRAM      TAKE these medications       albuterol 108 (90 BASE) MCG/ACT inhaler  Commonly known as:  PROVENTIL HFA;VENTOLIN HFA  Inhale 2 puffs into the lungs every 6 (six) hours as needed for wheezing or shortness of breath.     allopurinol 300 MG tablet  Commonly known as:  ZYLOPRIM  Take 300 mg by mouth daily.     ALPRAZolam 0.5 MG tablet  Commonly known as:  XANAX  Take 0.5 mg by mouth 3 (three) times daily as needed for anxiety.     amLODipine 2.5 MG tablet  Commonly known as:  NORVASC  Take 2.5 mg by mouth every morning.     aspirin 325 MG EC tablet  Take 1 tablet (325 mg total) by mouth 2 (two) times daily after a meal.     BIOTIN 5000 5 MG Caps  Generic drug:  Biotin  Take 1 capsule by mouth every morning.     digoxin 0.25 MG tablet  Commonly known as:  LANOXIN  Take 250 mcg by mouth every  morning.     doxepin 100 MG capsule  Commonly known as:  SINEQUAN  Take 100 mg by mouth at bedtime.     fexofenadine 60 MG tablet  Commonly known as:  ALLEGRA  Take 60 mg by mouth daily.     gabapentin 300 MG capsule  Commonly known as:  NEURONTIN  Take 300 mg by mouth 2 (two) times daily.     HYDROcodone-acetaminophen 5-325 MG per tablet  Commonly known as:  NORCO/VICODIN  Take 1-2 tablets by mouth every 4 (four) hours as needed.     multivitamin with minerals Tabs tablet  Take 1 tablet by mouth daily.     naproxen 500 MG tablet  Commonly known as:  NAPROSYN  Take 250 mg by mouth 2 (two) times daily with a meal.     omeprazole 20 MG capsule  Commonly known as:  PRILOSEC  Take 20 mg by mouth daily.     pravastatin 20 MG tablet  Commonly known as:  PRAVACHOL  Take 20 mg by mouth every morning.     PROBIOTIC PO  Take by mouth.     psyllium 58.6 % powder  Commonly known as:  METAMUCIL  Take 1 packet by mouth 3 (three) times daily.     quiNINE 324 MG capsule  Commonly known as:  QUALAQUIN  Take 324 mg by mouth at bedtime.     senna 8.6 MG tablet  Commonly known as:  SENOKOT  Take 5 tablets by mouth 2 (two) times daily.     zolpidem 10 MG tablet  Commonly known as:  AMBIEN  Take 10 mg by mouth at bedtime as needed for sleep.      ASK your doctor about these medications       ciprofloxacin 250 MG tablet  Commonly known as:  CIPRO  Take 250 mg by mouth 2 (two) times daily.        Diagnostic Studies: Dg Hip Complete Left  11/05/2012   CLINICAL DATA:  Left hip arthroplasty.  EXAM: DG OPERATIVE RIGHT HIP  TECHNIQUE: A single spot fluoroscopic AP image of the left hip is submitted.  COMPARISON:  09/14/2012.  FINDINGS: Two intraoperative fluoroscopic spot views of the pelvis demonstrates left total hip arthroplasty with femoral and acetabular components which appear to be proper the seated. No acute abnormalities are noted.  IMPRESSION: Intraoperative documentation  of left hip total arthroplasty.   Electronically Signed   By: Trudie Reed M.D.   On: 11/05/2012 19:21   Dg Pelvis Portable  11/05/2012   CLINICAL DATA:  Status post left hip replacement.  EXAM: PORTABLE PELVIS  COMPARISON:  09/14/2012.  FINDINGS: Interval left total hip prosthesis in satisfactory position and alignment. No fracture or dislocation seen. Diffuse osteopenia. Stable right total hip prosthesis.  IMPRESSION: Satisfactory postoperative appearance of a left total hip prosthesis.   Electronically Signed   By: Gordan Payment M.D.   On: 11/05/2012 19:10   Dg Hip Portable 1 View Left  11/05/2012   CLINICAL DATA:  Status post left hip replacement.  EXAM: PORTABLE LEFT HIP - 1 VIEW  COMPARISON:  C-arm radiographs obtained today.  FINDINGS: A portable cross-table lateral view of the left hip demonstrates satisfactory position and alignment of the total hip prosthesis. No visible fracture or dislocation on this view. Some of the bony detail is obscured by overlying soft tissues.  IMPRESSION: Satisfactory postoperative appearance of a left total hip prosthesis.   Electronically Signed   By: Gordan Payment M.D.   On: 11/05/2012 19:09   Dg C-arm 1-60 Min-no Report  11/05/2012   CLINICAL DATA:  Left hip arthroplasty.  EXAM: DG OPERATIVE RIGHT HIP  TECHNIQUE: A single spot fluoroscopic AP image of the left hip is submitted.  COMPARISON:  09/14/2012.  FINDINGS: Two intraoperative fluoroscopic spot views of the pelvis demonstrates left total hip arthroplasty with femoral and acetabular components which appear to be proper the seated. No acute abnormalities are noted.  IMPRESSION: Intraoperative documentation of left hip total arthroplasty.   Electronically Signed   By: Trudie Reed M.D.   On: 11/05/2012 19:21    Disposition: 03-Skilled Nursing Facility      Discharge Orders   Future Orders Complete By Expires   Discharge wound care:  As directed    Comments:     Keep dressing clean and intact.  May shower with dressing intact . Remove dressing Thursday and shower. After showering apply clean dressing.   Weight bearing as tolerated  As directed    Questions:     Laterality:     Extremity:        Follow-up Information   Follow up with Kathryne Hitch, MD. Schedule an appointment as soon as possible for a visit in 2 weeks. (2 weeks post-op )    Specialty:  Orthopedic Surgery   Contact information:   [address] [place] [address] [phone]        Signed: Richardean Canal 11/09/2012, 8:10 AM

## 2012-11-10 ENCOUNTER — Non-Acute Institutional Stay (SKILLED_NURSING_FACILITY): Payer: Medicare Other | Admitting: Internal Medicine

## 2012-11-10 DIAGNOSIS — K5900 Constipation, unspecified: Secondary | ICD-10-CM

## 2012-11-10 DIAGNOSIS — D62 Acute posthemorrhagic anemia: Secondary | ICD-10-CM

## 2012-11-10 DIAGNOSIS — K5909 Other constipation: Secondary | ICD-10-CM

## 2012-11-10 DIAGNOSIS — I10 Essential (primary) hypertension: Secondary | ICD-10-CM

## 2012-11-10 DIAGNOSIS — M169 Osteoarthritis of hip, unspecified: Secondary | ICD-10-CM

## 2012-11-11 ENCOUNTER — Other Ambulatory Visit: Payer: Self-pay | Admitting: *Deleted

## 2012-11-11 MED ORDER — HYDROCODONE-ACETAMINOPHEN 5-325 MG PO TABS: ORAL_TABLET | ORAL | Status: DC

## 2012-11-12 ENCOUNTER — Other Ambulatory Visit: Payer: Self-pay | Admitting: *Deleted

## 2012-11-12 MED ORDER — HYDROCODONE-ACETAMINOPHEN 5-325 MG PO TABS: ORAL_TABLET | ORAL | Status: DC

## 2012-11-12 NOTE — Telephone Encounter (Signed)
rx filled per protocol  

## 2012-11-15 ENCOUNTER — Non-Acute Institutional Stay (SKILLED_NURSING_FACILITY): Payer: Medicare Other | Admitting: Adult Health

## 2012-11-15 DIAGNOSIS — J45909 Unspecified asthma, uncomplicated: Secondary | ICD-10-CM

## 2012-11-15 DIAGNOSIS — F32A Depression, unspecified: Secondary | ICD-10-CM

## 2012-11-15 DIAGNOSIS — B009 Herpesviral infection, unspecified: Secondary | ICD-10-CM

## 2012-11-15 DIAGNOSIS — I10 Essential (primary) hypertension: Secondary | ICD-10-CM

## 2012-11-15 DIAGNOSIS — M797 Fibromyalgia: Secondary | ICD-10-CM

## 2012-11-15 DIAGNOSIS — E785 Hyperlipidemia, unspecified: Secondary | ICD-10-CM

## 2012-11-15 DIAGNOSIS — F329 Major depressive disorder, single episode, unspecified: Secondary | ICD-10-CM

## 2012-11-15 DIAGNOSIS — IMO0001 Reserved for inherently not codable concepts without codable children: Secondary | ICD-10-CM

## 2012-11-15 DIAGNOSIS — I499 Cardiac arrhythmia, unspecified: Secondary | ICD-10-CM

## 2012-11-15 DIAGNOSIS — K5900 Constipation, unspecified: Secondary | ICD-10-CM

## 2012-11-15 DIAGNOSIS — M109 Gout, unspecified: Secondary | ICD-10-CM

## 2012-11-15 DIAGNOSIS — K219 Gastro-esophageal reflux disease without esophagitis: Secondary | ICD-10-CM

## 2012-11-15 DIAGNOSIS — M1610 Unilateral primary osteoarthritis, unspecified hip: Secondary | ICD-10-CM

## 2012-11-17 DIAGNOSIS — Z471 Aftercare following joint replacement surgery: Secondary | ICD-10-CM

## 2012-11-17 DIAGNOSIS — Z96659 Presence of unspecified artificial knee joint: Secondary | ICD-10-CM

## 2012-11-22 NOTE — Anesthesia Postprocedure Evaluation (Signed)
  Anesthesia Post-op Note  Patient: Zoe [name]  Procedure(s) Performed: Procedure(s) (LRB): LEFT TOTAL HIP ARTHROPLASTY ANTERIOR APPROACH (Left)  Patient Location: PACU  Anesthesia Type: Spinal  Level of Consciousness: awake and alert   Airway and Oxygen Therapy: Patient Spontanous Breathing  Post-op Pain: mild  Post-op Assessment: Post-op Vital signs reviewed, Patient's Cardiovascular Status Stable, Respiratory Function Stable, Patent Airway and No signs of Nausea or vomiting  Last Vitals:  Filed Vitals:   11/09/12 1013  BP: 109/71  Pulse: 77  Temp: 36.9 C  Resp: 18    Post-op Vital Signs: stable   Complications: No apparent anesthesia complications

## 2012-11-24 ENCOUNTER — Encounter (INDEPENDENT_AMBULATORY_CARE_PROVIDER_SITE_OTHER): Payer: Self-pay | Admitting: *Deleted

## 2012-12-07 DIAGNOSIS — I10 Essential (primary) hypertension: Secondary | ICD-10-CM | POA: Insufficient documentation

## 2012-12-07 DIAGNOSIS — D62 Acute posthemorrhagic anemia: Secondary | ICD-10-CM | POA: Insufficient documentation

## 2012-12-07 NOTE — Progress Notes (Signed)
Patient ID: Zoe [name], female   DOB: [birth year], 77 y.o.   MRN: [id number]        HISTORY & PHYSICAL  DATE: 11/10/2012   FACILITY: [facility]  LEVEL OF CARE: SNF (31)  ALLERGIES:  Allergies  Allergen Reactions  . Codeine Other (See Comments)    Pains in abdominal area  . Morphine And Related     Abdominal pain  . Other     NOVACAINE     ITCHING  . Oxycodone     Abdominal pain    CHIEF COMPLAINT:  Manage left hip osteoarthritis, acute blood loss anemia, and hypertension.    HISTORY OF PRESENT ILLNESS:  The patient is a 77 year-old, Caucasian female.    HIP OSTEOARTHRITIS: patient had advanced end stage OA of the hip with progressively worsening pain & dysfunction.  Pt failed non-surgical conservative management.  Therefore pt underwent total hip arthroplasty & tolerated the procedure well.  Pt denies hip pain currently.  Pt was admitted to this facility for short term rehabilitation.    ANEMIA:  Postoperatively, patient suffered acute blood loss.  The anemia has been stable. The patient denies fatigue, melena or hematochezia. No complications from the medications currently being used.  Last hemoglobins are:    8.8, 9.1.   The patient is currently on iron.    HTN: Pt 's HTN remains stable.  Denies CP, sob, DOE, pedal edema, headaches, dizziness or visual disturbances.  No complications from the medications currently being used.  Last BP :  102/61.    PAST MEDICAL HISTORY :  Past Medical History  Diagnosis Date  . GERD (gastroesophageal reflux disease)   . Chronic constipation   . Cough   . Nausea   . Hemorrhoids   . High cholesterol   . Asthma   . Environmental allergies   . Urgency of urination   . Arthritis   . Hypercholesteremia   . Hypertension   . Dysrhythmia     tachycardia  . Anxiety   . Depression   . Pneumonia     7+ YRS  . Fibromyalgia   . Cancer     skin CA  . Abdominal discomfort 11-02-12    suspected ?UTI- PCP MD placed on  Cipro    PAST SURGICAL HISTORY: Past Surgical History  Procedure Laterality Date  . Upper gastrointestinal endoscopy  08/03/2009    NUR  . Upper gastrointestinal endoscopy  03/29/2003    EGD ED TCS  . Colonoscopy  03/29/03  . Umbilical hernia repair  04/26/03    JENKINS  . Cholecystectomy    . Appendectomy    . Eye surgery      bilateral cataract removal  . Skin cancer excision      RIGHT NECK    . Tonsillectomy      T+A  . Back surgery      X2   . Total hip arthroplasty Right 09/14/2012    Procedure: RIGHT TOTAL HIP ARTHROPLASTY ANTERIOR APPROACH and RIGHT KNEE STEROID INJECTION;  Surgeon: Kathryne Hitch, MD;  Location: [facility] OR;  Service: Orthopedics;  Laterality: Right;  . Total hip arthroplasty Left 11/05/2012    Procedure: LEFT TOTAL HIP ARTHROPLASTY ANTERIOR APPROACH;  Surgeon: Kathryne Hitch, MD;  Location: [facility] ORS;  Service: Orthopedics;  Laterality: Left;    SOCIAL HISTORY:  reports that she has been smoking Cigarettes.  She has a 12.5 pack-year smoking history. She has never used smokeless tobacco. She  reports that she drinks alcohol. She reports that she does not use illicit drugs.  FAMILY HISTORY:  Family History  Problem Relation Age of Onset  . Healthy Son   . Healthy Son   . Healthy Daughter     CURRENT MEDICATIONS: Reviewed per [facility]  REVIEW OF SYSTEMS:   GI:  Complains of constipation.    See HPI otherwise 14 point ROS is negative.  PHYSICAL EXAMINATION  VS:  T 98.8       P 68      RR 18      BP 102/61      POX 95% room air        WT (Lb)  GENERAL: no acute distress, normal body habitus EYES: conjunctivae normal, sclerae normal, normal eye lids MOUTH/THROAT: lips without lesions,no lesions in the mouth,tongue is without lesions,uvula elevates in midline NECK: supple, trachea midline, no neck masses, no thyroid tenderness, no thyromegaly LYMPHATICS: no LAN in the neck, no supraclavicular LAN RESPIRATORY: breathing is even & unlabored,  BS CTAB CARDIAC: RRR, no murmur,no extra heart sounds, no edema GI:  ABDOMEN: abdomen soft, normal BS, no masses, no tenderness  LIVER/SPLEEN: no hepatomegaly, no splenomegaly MUSCULOSKELETAL: HEAD: normal to inspection & palpation BACK: no kyphosis, scoliosis or spinal processes tenderness EXTREMITIES: LEFT UPPER EXTREMITY: full range of motion, normal strength & tone RIGHT UPPER EXTREMITY:  full range of motion, normal strength & tone LEFT LOWER EXTREMITY: strength intact, range of motion not tested due to surgery   RIGHT LOWER EXTREMITY:  full range of motion, normal strength & tone PSYCHIATRIC: the patient is alert & oriented to person, affect & behavior appropriate  LABS/RADIOLOGY: Hemoglobin 8.8, platelets 144, WBC 8.1.    Left hip x-ray postsurgically:  Showed left total hip arthroplasty.    Pelvic x-ray postsurgically:  Showed satisfactory postoperative appearance of left total hip prosthesis.    ASSESSMENT/PLAN:  Left hip osteoarthritis.  Status post left total hip arthroplasty.  Continue rehabilitation.    Acute blood loss anemia.  Reassess hemoglobin level.    Hypertension.  Well controlled.    Constipation.  Uncontrolled problem.  Start MiraLAX 7 g q.d.    Allergic rhinitis.  Well controlled.    GERD.  Well controlled.     Check CBC and BMP.     I have reviewed patient's medical records received at admission/from hospitalization.  CPT CODE: 01027

## 2012-12-08 DIAGNOSIS — F329 Major depressive disorder, single episode, unspecified: Secondary | ICD-10-CM | POA: Insufficient documentation

## 2012-12-08 DIAGNOSIS — I499 Cardiac arrhythmia, unspecified: Secondary | ICD-10-CM | POA: Insufficient documentation

## 2012-12-08 DIAGNOSIS — E785 Hyperlipidemia, unspecified: Secondary | ICD-10-CM | POA: Insufficient documentation

## 2012-12-08 DIAGNOSIS — M109 Gout, unspecified: Secondary | ICD-10-CM | POA: Insufficient documentation

## 2012-12-08 DIAGNOSIS — K5900 Constipation, unspecified: Secondary | ICD-10-CM | POA: Insufficient documentation

## 2012-12-08 DIAGNOSIS — M797 Fibromyalgia: Secondary | ICD-10-CM | POA: Insufficient documentation

## 2012-12-08 DIAGNOSIS — J45909 Unspecified asthma, uncomplicated: Secondary | ICD-10-CM | POA: Insufficient documentation

## 2012-12-08 DIAGNOSIS — F32A Depression, unspecified: Secondary | ICD-10-CM | POA: Insufficient documentation

## 2012-12-08 NOTE — Progress Notes (Signed)
Patient ID: Zoe [name], female   DOB: 1935/10/25, 77 y.o.   MRN: [id number]       PROGRESS NOTE  DATE: 11/15/2012   FACILITY: [facility]  LEVEL OF CARE: SNF (31)  Acute Visit  CHIEF COMPLAINT: Discharge Notes  HISTORY OF PRESENT ILLNESS:This is a 77 year old female who is for discharge home with Home health PT, OT and Nursing. She has been admitted to [facility] on 11/09/12 from [facility] with Degenerative Arthritis S/P Left Hip Arthroplasty. Patient was admitted to this facility for short-term rehabilitation after the patient's recent hospitalization.  Patient has completed SNF rehabilitation and therapy has cleared the patient for discharge.  Reassessment of ongoing problem(s):  HTN: Pt 's HTN remains stable.  Denies CP, sob, DOE, pedal edema, headaches, dizziness or visual disturbances.  No complications from the medications currently being used.  Last BP : 115/59  GOUT: Patien'st  gout remains stable. Patient denies joint pan, redness, swelling or warmth. No complications reported from the medications presently being used.  ASTHMA: The patient's asthma remains stable. Patient denies shortness of breath, dyspnea on exertion or wheezing. No complications reported from the medications currently being used.  PAST MEDICAL HISTORY : Reviewed.  No changes.  CURRENT MEDICATIONS: Reviewed per [facility]  REVIEW OF SYSTEMS:  GENERAL: no change in appetite, no fatigue, no weight changes, no fever, chills or weakness RESPIRATORY: no cough, SOB, DOE, wheezing, hemoptysis CARDIAC: no chest pain, edema or palpitations GI: no abdominal pain, diarrhea, constipation, heart burn, nausea or vomiting  PHYSICAL EXAMINATION  VS:  T98.3       P69       RR20      BP115/59            WT179 (Lb)  GENERAL: no acute distress, normal body habitus EYES: conjunctivae normal, sclerae normal, normal eye lids MOUTH: vesicular lesion on right corner of mouth NECK: supple,  trachea midline, no neck masses, no thyroid tenderness, no thyromegaly LYMPHATICS: no LAN in the neck, no supraclavicular LAN RESPIRATORY: breathing is even & unlabored, BS CTAB CARDIAC: RRR, no murmur,no extra heart sounds, no edema GI: abdomen soft, normal BS, no masses, no tenderness, no hepatomegaly, no splenomegaly PSYCHIATRIC: the patient is alert & oriented to person, affect & behavior appropriate  LABS/RADIOLOGY: 11/10/12 wbc 5.8  hgb 8.9  hct 28.2  NA 137  K 4.1  Glucose 118  BUN 11  Creatinine 0.5  CA 8.2  ASSESSMENT/PLAN:  Degenerative Arthritis S/P Left total hip arthroplasty  - for Home health PT, OT and Nursing  Hyperlipidemia - continue Atorvastatin  Gout - continue Allopurinol  Hypertension - well-controlled  Dysrhythmia - rate-controlled; continue Digoxin  Depression  Continue Doxepin  Asthma - well-controlled  Fibromyalgia - no complaints of pain; continue Gabapentin  GERD - stable; continue Omeprazole  Constipation - no complaints   Herpes Simplex (New) - Apply Abreva 2 % cream to sore on lesion on right corner of mouth  5X/dayX 1 week   I have filled out patient's discharge paperwork and written prescriptions.  Patient will receive home health PT, OT and Nursing.  Total discharge time: Greater than 30 minutes Discharge time involved coordination of the discharge process with social worker, nursing staff and therapy department. Medical justification for home health services verified.  CPT CODE: 40981

## 2013-02-08 ENCOUNTER — Ambulatory Visit (INDEPENDENT_AMBULATORY_CARE_PROVIDER_SITE_OTHER): Payer: Medicare Other | Admitting: Internal Medicine

## 2013-02-08 ENCOUNTER — Encounter (INDEPENDENT_AMBULATORY_CARE_PROVIDER_SITE_OTHER): Payer: Self-pay | Admitting: Internal Medicine

## 2013-02-08 VITALS — BP 126/68 | HR 88 | Temp 96.5°F | Resp 20 | Ht 67.5 in | Wt 172.0 lb

## 2013-02-08 DIAGNOSIS — R1032 Left lower quadrant pain: Secondary | ICD-10-CM

## 2013-02-08 MED ORDER — CIPROFLOXACIN HCL 500 MG PO TABS: 500.0000 mg | ORAL_TABLET | Freq: Two times a day (BID) | ORAL | Status: DC

## 2013-02-08 MED ORDER — HYOSCYAMINE SULFATE 0.125 MG SL SUBL: 0.2500 mg | SUBLINGUAL_TABLET | Freq: Three times a day (TID) | SUBLINGUAL | Status: DC | PRN

## 2013-02-08 MED ORDER — METRONIDAZOLE 500 MG PO TABS: 500.0000 mg | ORAL_TABLET | Freq: Two times a day (BID) | ORAL | Status: DC

## 2013-02-08 NOTE — Patient Instructions (Signed)
Call the office with progress report on 02/11/2013.

## 2013-02-08 NOTE — Progress Notes (Signed)
Presenting complaint;  LLQ abdominal pain and nausea.  Subjective:  [name] who is here for scheduled visit. Patient is well known to me from prior visits. She was seen back in October 2012 for chronic constipation and more recently in March 2013 by Ms. Deberah Castle NP for epigastric pain and GERD. She now presents with ten-day history of throbbing pain and left lower quadrant of her abdomen. While it is constant pain it comes in waves and then is intense it is associated with nausea but no vomiting. Pressure in this region makes the pain worse. Her bowels are moving daily and she does not get any relief and she also experiences no change with meals. She denies fever or chills. She states she has had this pain off and on for years. She had an episode about 3 months ago and it resolved spontaneously after several days. During episodes of pain she does not have good appetite. She denies dysuria or hematuria. Heartburn is well controlled with PPI. She was recently seen with Levaquin and prednisone for URI. Last dose of prednisone was one week ago. She has chronic back pain which she describes to be different than the pain in left low quadrant of her abdomen. She takes hydrocodone for back pain which does not seem to make much difference to this pain.  Current Medications: Current Outpatient Prescriptions  Medication Sig Dispense Refill  . allopurinol (ZYLOPRIM) 300 MG tablet Take 300 mg by mouth daily.      Marland Kitchen ALPRAZolam (XANAX) 0.5 MG tablet Take 0.5 mg by mouth 3 (three) times daily as needed for anxiety. Pt takes 3-4 tablets weekly      . amLODipine (NORVASC) 2.5 MG tablet Take 2.5 mg by mouth every morning.       Marland Kitchen aspirin EC 325 MG EC tablet Take 1 tablet (325 mg total) by mouth 2 (two) times daily after a meal.  45 tablet  0  . Biotin (BIOTIN 5000) 5 MG CAPS Take 1 capsule by mouth every morning.      . digoxin (LANOXIN) 0.25 MG tablet Take 250 mcg by mouth every  morning.       Marland Kitchen doxepin (SINEQUAN) 100 MG capsule Take 100 mg by mouth at bedtime.       . fexofenadine (ALLEGRA) 60 MG tablet Take 60 mg by mouth daily. Pt takes 1/2 tablet daily      . gabapentin (NEURONTIN) 300 MG capsule Take 300 mg by mouth 2 (two) times daily.       . Multiple Vitamin (MULITIVITAMIN WITH MINERALS) TABS Take 1 tablet by mouth daily.      . naproxen (NAPROSYN) 500 MG tablet Take 250 mg by mouth 2 (two) times daily with a meal.      . omeprazole (PRILOSEC) 20 MG capsule Take 20 mg by mouth daily.      . pravastatin (PRAVACHOL) 20 MG tablet Take 20 mg by mouth every morning. Pt takes it every other day      . psyllium (METAMUCIL) 58.6 % powder Take 1 packet by mouth daily.       . quiNINE (QUALAQUIN) 324 MG capsule Take 324 mg by mouth at bedtime.       . senna (SENOKOT) 8.6 MG tablet Take 5 tablets by mouth 2 (two) times daily.       Marland Kitchen zolpidem (AMBIEN) 10 MG tablet Take 10 mg by mouth at bedtime as needed for sleep.      Marland Kitchen  albuterol (PROVENTIL HFA;VENTOLIN HFA) 108 (90 BASE) MCG/ACT inhaler Inhale 2 puffs into the lungs every 6 (six) hours as needed for wheezing or shortness of breath.      Marland Kitchen HYDROcodone-acetaminophen (NORCO/VICODIN) 5-325 MG per tablet Take two tablets by mouth every four hours as needed; **DON NOT EXCEED 4 GMS OF TYLENOL IN 24 HOURS.**.  360 tablet  0  . Probiotic Product (PROBIOTIC PO) Take by mouth.       No current facility-administered medications for this visit.   Past medical history; GERD. Last EGD was in July 2011 revealing small sliding hiatal hernia and Candida esophagitis. Chronic insomnia. Chronic low back pain. Asthma triggered by exposure to mold. Chronic constipation. Last colonoscopy was in March 2005. Hypertension. Hyperlipidemia. Status post appendectomy. Status post cholecystectomy. Right hip replacement in August 2014. Left hip replacement in October 2014    Objective: Blood pressure 126/68, pulse 88, temperature 96.5 F  (35.8 C), temperature source Oral, resp. rate 20, height 5' 7.5" (1.715 m), weight 172 lb (78.019 kg). Patient is alert and in no acute distress but she appears quite frustrated because of her illness. Conjunctiva is pink. Sclera is nonicteric Oropharyngeal mucosa is normal. No neck masses or thyromegaly noted. Cardiac exam with regular rhythm normal S1 and S2. No murmur or gallop noted. Lungs are clear to auscultation. Abdomen is symmetrical. Bowel sounds are normal. On palpation abdomen is soft with mild to moderate tenderness in left lower quadrant on deep palpation. There is no guarding. No organomegaly or masses noted. Rectal examination reveals soft stool in the vault which is guaiac-negative.  No LE edema or clubbing noted.  Labs/studies Results: Abdominal pelvic CT from 04/11/2011 reviewed; it was done when she was seen in emergency room for epigastric pain. No visceral abnormality noted.   Assessment:  #1. LLQ abdominal pain of 10 days duration suspicious for diverticulitis. Last colonoscopy was in March 2005 revealing melanosis coli and hemorrhoids. If she does not respond to therapy would consider abdominopelvic CT. #2. Chronic constipation. She has constipation of several years and appears to be doing well with present therapy. #3. GERD.    Plan:  Cipro 500 mg by mouth twice a day for 10 days. Metronidazole 500 mg by mouth twice a day for 10 days. Hyoscyamine sublingual one to 2 tablets 3 times a day when necessary. Patient advised to call with progress report by end of the week. If she does not respond to antibiotic therapy would consider abdominopelvic CT.

## 2013-03-02 ENCOUNTER — Encounter (HOSPITAL_COMMUNITY): Payer: Self-pay | Admitting: Emergency Medicine

## 2013-03-02 ENCOUNTER — Other Ambulatory Visit (INDEPENDENT_AMBULATORY_CARE_PROVIDER_SITE_OTHER): Payer: Self-pay | Admitting: Internal Medicine

## 2013-03-02 ENCOUNTER — Emergency Department (HOSPITAL_COMMUNITY): Payer: Medicare Other

## 2013-03-02 ENCOUNTER — Ambulatory Visit (HOSPITAL_COMMUNITY): Payer: Medicare Other

## 2013-03-02 ENCOUNTER — Inpatient Hospital Stay (HOSPITAL_COMMUNITY)
Admission: EM | Admit: 2013-03-02 | Discharge: 2013-03-04 | DRG: 390 | Disposition: A | Discharge status: Home / Self Care | Payer: Medicare Other | Attending: Pulmonary Disease | Admitting: Pulmonary Disease

## 2013-03-02 DIAGNOSIS — K219 Gastro-esophageal reflux disease without esophagitis: Secondary | ICD-10-CM | POA: Diagnosis present

## 2013-03-02 DIAGNOSIS — M5137 Other intervertebral disc degeneration, lumbosacral region: Secondary | ICD-10-CM | POA: Diagnosis present

## 2013-03-02 DIAGNOSIS — R1032 Left lower quadrant pain: Secondary | ICD-10-CM

## 2013-03-02 DIAGNOSIS — E785 Hyperlipidemia, unspecified: Secondary | ICD-10-CM | POA: Diagnosis present

## 2013-03-02 DIAGNOSIS — M169 Osteoarthritis of hip, unspecified: Secondary | ICD-10-CM | POA: Diagnosis present

## 2013-03-02 DIAGNOSIS — I10 Essential (primary) hypertension: Secondary | ICD-10-CM | POA: Diagnosis present

## 2013-03-02 DIAGNOSIS — J45909 Unspecified asthma, uncomplicated: Secondary | ICD-10-CM | POA: Diagnosis present

## 2013-03-02 DIAGNOSIS — F32A Depression, unspecified: Secondary | ICD-10-CM | POA: Diagnosis present

## 2013-03-02 DIAGNOSIS — Z885 Allergy status to narcotic agent status: Secondary | ICD-10-CM

## 2013-03-02 DIAGNOSIS — M797 Fibromyalgia: Secondary | ICD-10-CM | POA: Diagnosis present

## 2013-03-02 DIAGNOSIS — M4800 Spinal stenosis, site unspecified: Secondary | ICD-10-CM | POA: Diagnosis present

## 2013-03-02 DIAGNOSIS — Z79899 Other long term (current) drug therapy: Secondary | ICD-10-CM

## 2013-03-02 DIAGNOSIS — M1610 Unilateral primary osteoarthritis, unspecified hip: Secondary | ICD-10-CM | POA: Diagnosis present

## 2013-03-02 DIAGNOSIS — E7800 Pure hypercholesterolemia, unspecified: Secondary | ICD-10-CM | POA: Diagnosis present

## 2013-03-02 DIAGNOSIS — K5909 Other constipation: Secondary | ICD-10-CM | POA: Diagnosis present

## 2013-03-02 DIAGNOSIS — Z7982 Long term (current) use of aspirin: Secondary | ICD-10-CM

## 2013-03-02 DIAGNOSIS — IMO0001 Reserved for inherently not codable concepts without codable children: Secondary | ICD-10-CM | POA: Diagnosis present

## 2013-03-02 DIAGNOSIS — K56609 Unspecified intestinal obstruction, unspecified as to partial versus complete obstruction: Secondary | ICD-10-CM | POA: Diagnosis present

## 2013-03-02 DIAGNOSIS — M25569 Pain in unspecified knee: Secondary | ICD-10-CM | POA: Diagnosis present

## 2013-03-02 DIAGNOSIS — Z85828 Personal history of other malignant neoplasm of skin: Secondary | ICD-10-CM

## 2013-03-02 DIAGNOSIS — F17200 Nicotine dependence, unspecified, uncomplicated: Secondary | ICD-10-CM | POA: Diagnosis present

## 2013-03-02 DIAGNOSIS — Z96649 Presence of unspecified artificial hip joint: Secondary | ICD-10-CM

## 2013-03-02 DIAGNOSIS — F329 Major depressive disorder, single episode, unspecified: Secondary | ICD-10-CM | POA: Diagnosis present

## 2013-03-02 DIAGNOSIS — M1990 Unspecified osteoarthritis, unspecified site: Secondary | ICD-10-CM | POA: Diagnosis present

## 2013-03-02 DIAGNOSIS — F3289 Other specified depressive episodes: Secondary | ICD-10-CM | POA: Diagnosis present

## 2013-03-02 DIAGNOSIS — R110 Nausea: Secondary | ICD-10-CM

## 2013-03-02 DIAGNOSIS — M51379 Other intervertebral disc degeneration, lumbosacral region without mention of lumbar back pain or lower extremity pain: Secondary | ICD-10-CM | POA: Diagnosis present

## 2013-03-02 DIAGNOSIS — F411 Generalized anxiety disorder: Secondary | ICD-10-CM | POA: Diagnosis present

## 2013-03-02 DIAGNOSIS — Z884 Allergy status to anesthetic agent status: Secondary | ICD-10-CM

## 2013-03-02 LAB — COMPREHENSIVE METABOLIC PANEL
ALT: 15 U/L (ref 0–35)
AST: 19 U/L (ref 0–37)
Albumin: 3.9 g/dL (ref 3.5–5.2)
Alkaline Phosphatase: 72 U/L (ref 39–117)
BUN: 17 mg/dL (ref 6–23)
CO2: 24 mEq/L (ref 19–32)
Calcium: 9.3 mg/dL (ref 8.4–10.5)
Chloride: 100 mEq/L (ref 96–112)
Creatinine, Ser: 0.68 mg/dL (ref 0.50–1.10)
GFR calc Af Amer: >90 mL/min (ref >90)
GFR calc non Af Amer: 83 mL/min — ABNORMAL LOW (ref >90)
Glucose, Bld: 131 mg/dL — ABNORMAL HIGH (ref 70–99)
Potassium: 4.2 mEq/L (ref 3.7–5.3)
Sodium: 139 mEq/L (ref 137–147)
Total Bilirubin: 0.8 mg/dL (ref 0.3–1.2)
Total Protein: 7.5 g/dL (ref 6.0–8.3)

## 2013-03-02 LAB — CBC WITH DIFFERENTIAL/PLATELET
Basophils Absolute: 0 10*3/uL (ref 0.0–0.1)
Basophils Relative: 0 % (ref 0–1)
Eosinophils Absolute: 0.2 10*3/uL (ref 0.0–0.7)
Eosinophils Relative: 3 % (ref 0–5)
HCT: 41.2 % (ref 36.0–46.0)
Hemoglobin: 14.3 g/dL (ref 12.0–15.0)
Lymphocytes Relative: 9 % — ABNORMAL LOW (ref 12–46)
Lymphs Abs: 0.8 10*3/uL (ref 0.7–4.0)
MCH: 32.8 pg (ref 26.0–34.0)
MCHC: 34.7 g/dL (ref 30.0–36.0)
MCV: 94.5 fL (ref 78.0–100.0)
Monocytes Absolute: 1.1 10*3/uL — ABNORMAL HIGH (ref 0.1–1.0)
Monocytes Relative: 12 % (ref 3–12)
Neutro Abs: 6.8 10*3/uL (ref 1.7–7.7)
Neutrophils Relative %: 76 % (ref 43–77)
Platelets: 178 10*3/uL (ref 150–400)
RBC: 4.36 MIL/uL (ref 3.87–5.11)
RDW: 15 % (ref 11.5–15.5)
WBC: 9 10*3/uL (ref 4.0–10.5)

## 2013-03-02 LAB — URINALYSIS, ROUTINE W REFLEX MICROSCOPIC
Bilirubin Urine: NEGATIVE
Glucose, UA: NEGATIVE mg/dL
Hgb urine dipstick: NEGATIVE
Ketones, ur: NEGATIVE mg/dL
Leukocytes, UA: NEGATIVE
Nitrite: NEGATIVE
Protein, ur: NEGATIVE mg/dL
Specific Gravity, Urine: <1.005 — ABNORMAL LOW (ref 1.005–1.030)
Urobilinogen, UA: 0.2 mg/dL (ref 0.0–1.0)
pH: 5.5 (ref 5.0–8.0)

## 2013-03-02 LAB — LIPASE, BLOOD: Lipase: 19 U/L (ref 11–59)

## 2013-03-02 LAB — LACTIC ACID, PLASMA: Lactic Acid, Venous: 2.1 mmol/L (ref 0.5–2.2)

## 2013-03-02 MED ORDER — FENTANYL CITRATE 0.05 MG/ML IJ SOLN
50.0000 ug | INTRAMUSCULAR | Status: DC | PRN
  Filled 2013-03-02 (×3): qty 2

## 2013-03-02 MED ORDER — FENTANYL CITRATE 0.05 MG/ML IJ SOLN
50.0000 ug | Freq: Once | INTRAMUSCULAR | Status: AC
  Administered 2013-03-02: 50 ug via INTRAVENOUS
  Filled 2013-03-02: qty 2

## 2013-03-02 MED ORDER — ACETAMINOPHEN 650 MG RE SUPP: 650.0000 mg | Freq: Four times a day (QID) | RECTAL | Status: DC | PRN

## 2013-03-02 MED ORDER — IOHEXOL 300 MG/ML  SOLN
50.0000 mL | Freq: Once | INTRAMUSCULAR | Status: AC | PRN
  Administered 2013-03-02: 50 mL via ORAL

## 2013-03-02 MED ORDER — FENTANYL CITRATE 0.05 MG/ML IJ SOLN
50.0000 ug | INTRAMUSCULAR | Status: DC | PRN
  Administered 2013-03-02 – 2013-03-03 (×3): 50 ug via INTRAVENOUS

## 2013-03-02 MED ORDER — SODIUM CHLORIDE 0.9 % IV BOLUS (SEPSIS)
1000.0000 mL | Freq: Once | INTRAVENOUS | Status: AC
  Administered 2013-03-02: 1000 mL via INTRAVENOUS

## 2013-03-02 MED ORDER — ONDANSETRON HCL 4 MG PO TABS: 4.0000 mg | ORAL_TABLET | Freq: Four times a day (QID) | ORAL | Status: DC | PRN

## 2013-03-02 MED ORDER — ONDANSETRON HCL 4 MG/2ML IJ SOLN
4.0000 mg | Freq: Four times a day (QID) | INTRAMUSCULAR | Status: DC | PRN
  Administered 2013-03-02 – 2013-03-03 (×3): 4 mg via INTRAVENOUS
  Filled 2013-03-02: qty 2

## 2013-03-02 MED ORDER — ONDANSETRON HCL 4 MG/2ML IJ SOLN
4.0000 mg | Freq: Once | INTRAMUSCULAR | Status: AC
  Administered 2013-03-02: 4 mg via INTRAVENOUS
  Filled 2013-03-02: qty 2

## 2013-03-02 MED ORDER — SODIUM CHLORIDE 0.9 % IV SOLN
INTRAVENOUS | Status: DC
  Administered 2013-03-02 – 2013-03-03 (×2): via INTRAVENOUS

## 2013-03-02 MED ORDER — ONDANSETRON HCL 4 MG/2ML IJ SOLN
4.0000 mg | Freq: Four times a day (QID) | INTRAMUSCULAR | Status: DC | PRN
  Filled 2013-03-02 (×2): qty 2

## 2013-03-02 MED ORDER — ENOXAPARIN SODIUM 40 MG/0.4ML ~~LOC~~ SOLN
40.0000 mg | SUBCUTANEOUS | Status: DC
  Administered 2013-03-02 – 2013-03-03 (×2): 40 mg via SUBCUTANEOUS
  Filled 2013-03-02 (×2): qty 0.4

## 2013-03-02 MED ORDER — ACETAMINOPHEN 325 MG PO TABS: 650.0000 mg | ORAL_TABLET | Freq: Four times a day (QID) | ORAL | Status: DC | PRN

## 2013-03-02 MED ORDER — PROMETHAZINE HCL 25 MG/ML IJ SOLN
12.5000 mg | Freq: Once | INTRAMUSCULAR | Status: AC
  Administered 2013-03-02: 12.5 mg via INTRAVENOUS
  Filled 2013-03-02: qty 1

## 2013-03-02 MED ORDER — IOHEXOL 300 MG/ML  SOLN
100.0000 mL | Freq: Once | INTRAMUSCULAR | Status: AC | PRN
  Administered 2013-03-02: 100 mL via INTRAVENOUS

## 2013-03-02 NOTE — Consult Note (Signed)
Reason for Consult: Small bowel obstruction Referring Physician: Dr. [name] is an 78 y.o. female.  HPI: Patient is a 77 year old white female who has undergone 2 hip replacements within the past year who presents with an episode of nausea and vomiting. She states she has had chronic left-sided abdominal pain over the past few years which she has been followed with by Dr. Laural [name] of GI. She was having significant diarrhea 2 days ago. She states she normally is constipated. This morning, she had an episode of emesis. She presented emergency room on CT scan the abdomen and pelvis revealed a possible small bowel obstruction with transition point in the right lower quadrant of the abdomen, possibly secondary to previous appendectomy. She currently has only mild left-sided abdominal pain. No significant distention is noted.  Past Medical History  Diagnosis Date  . GERD (gastroesophageal reflux disease)   . Chronic constipation   . Cough   . Nausea   . Hemorrhoids   . High cholesterol   . Asthma   . Environmental allergies   . Urgency of urination   . Arthritis   . Hypercholesteremia   . Hypertension   . Dysrhythmia     tachycardia  . Anxiety   . Depression   . Pneumonia     7+ YRS  . Fibromyalgia   . Cancer     skin CA  . Abdominal discomfort 11-02-12    suspected ?UTI- PCP MD placed on Cipro    Past Surgical History  Procedure Laterality Date  . Upper gastrointestinal endoscopy  08/03/2009    NUR  . Upper gastrointestinal endoscopy  03/29/2003    EGD ED TCS  . Colonoscopy  03/29/03  . Umbilical hernia repair  04/26/03    [name]  . Cholecystectomy    . Appendectomy    . Eye surgery      bilateral cataract removal  . Skin cancer excision      RIGHT NECK    . Tonsillectomy      T+A  . Back surgery      X2   . Total hip arthroplasty Right 09/14/2012    Procedure: RIGHT TOTAL HIP ARTHROPLASTY ANTERIOR APPROACH and RIGHT KNEE STEROID INJECTION;  Surgeon:  Zoe Rossetti, MD;  Location: [name];  Service: Orthopedics;  Laterality: Right;  . Total hip arthroplasty Left 11/05/2012    Procedure: LEFT TOTAL HIP ARTHROPLASTY ANTERIOR APPROACH;  Surgeon: Zoe Rossetti, MD;  Location: [facility];  Service: Orthopedics;  Laterality: Left;    Family History  Problem Relation Age of Onset  . Healthy Son   . Healthy Son   . Healthy Daughter     Social History:  reports that she has been smoking Cigarettes.  She has a 12.5 pack-year smoking history. She has never used smokeless tobacco. She reports that she does not drink alcohol or use illicit drugs.  Allergies:  Allergies  Allergen Reactions  . Codeine Other (See Comments)    Pains in abdominal area  . Morphine And Related     Abdominal pain  . Other     NOVACAINE     ITCHING  . Oxycodone     Abdominal pain    Medications: I have reviewed the patient's current medications.  Results for orders placed during the hospital encounter of 03/02/13 (from the past 48 hour(s))  URINALYSIS, ROUTINE W REFLEX MICROSCOPIC     Status: Abnormal   Collection Time    03/02/13 10:15 AM  Result Value Range   Color, Urine YELLOW  YELLOW   APPearance CLEAR  CLEAR   Specific Gravity, Urine <1.005 (*) 1.005 - 1.030   pH 5.5  5.0 - 8.0   Glucose, UA NEGATIVE  NEGATIVE mg/dL   Hgb urine dipstick NEGATIVE  NEGATIVE   Bilirubin Urine NEGATIVE  NEGATIVE   Ketones, ur NEGATIVE  NEGATIVE mg/dL   Protein, ur NEGATIVE  NEGATIVE mg/dL   Urobilinogen, UA 0.2  0.0 - 1.0 mg/dL   Nitrite NEGATIVE  NEGATIVE   Leukocytes, UA NEGATIVE  NEGATIVE   Comment: MICROSCOPIC NOT DONE ON URINES WITH NEGATIVE PROTEIN, BLOOD, LEUKOCYTES, NITRITE, OR GLUCOSE <1000 mg/dL.  CBC WITH DIFFERENTIAL     Status: Abnormal   Collection Time    03/02/13 10:50 AM      Result Value Range   WBC 9.0  4.0 - 10.5 K/uL   RBC 4.36  3.87 - 5.11 MIL/uL   Hemoglobin 14.3  12.0 - 15.0 g/dL   HCT 41.2  36.0 - 46.0 %   MCV 94.5  78.0  - 100.0 fL   MCH 32.8  26.0 - 34.0 pg   MCHC 34.7  30.0 - 36.0 g/dL   RDW 15.0  11.5 - 15.5 %   Platelets 178  150 - 400 K/uL   Neutrophils Relative % 76  43 - 77 %   Neutro Abs 6.8  1.7 - 7.7 K/uL   Lymphocytes Relative 9 (*) 12 - 46 %   Lymphs Abs 0.8  0.7 - 4.0 K/uL   Monocytes Relative 12  3 - 12 %   Monocytes Absolute 1.1 (*) 0.1 - 1.0 K/uL   Eosinophils Relative 3  0 - 5 %   Eosinophils Absolute 0.2  0.0 - 0.7 K/uL   Basophils Relative 0  0 - 1 %   Basophils Absolute 0.0  0.0 - 0.1 K/uL  COMPREHENSIVE METABOLIC PANEL     Status: Abnormal   Collection Time    03/02/13 10:50 AM      Result Value Range   Sodium 139  137 - 147 mEq/L   Potassium 4.2  3.7 - 5.3 mEq/L   Chloride 100  96 - 112 mEq/L   CO2 24  19 - 32 mEq/L   Glucose, Bld 131 (*) 70 - 99 mg/dL   BUN 17  6 - 23 mg/dL   Creatinine, Ser 0.68  0.50 - 1.10 mg/dL   Calcium 9.3  8.4 - 10.5 mg/dL   Total Protein 7.5  6.0 - 8.3 g/dL   Albumin 3.9  3.5 - 5.2 g/dL   AST 19  0 - 37 U/L   ALT 15  0 - 35 U/L   Alkaline Phosphatase 72  39 - 117 U/L   Total Bilirubin 0.8  0.3 - 1.2 mg/dL   GFR calc non Af Amer 83 (*) >90 mL/min   GFR calc Af Amer >90  >90 mL/min   Comment: (NOTE)     The eGFR has been calculated using the CKD EPI equation.     This calculation has not been validated in all clinical situations.     eGFR's persistently <90 mL/min signify possible Chronic Kidney     Disease.  LIPASE, BLOOD     Status: None   Collection Time    03/02/13 10:50 AM      Result Value Range   Lipase 19  11 - 59 U/L  LACTIC ACID, PLASMA     Status: None  Collection Time    03/02/13 11:00 AM      Result Value Range   Lactic Acid, Venous 2.1  0.5 - 2.2 mmol/L    Ct Abdomen Pelvis W Contrast  03/02/2013   CLINICAL DATA:  Abdominal pain, history hypertension  EXAM: CT ABDOMEN AND PELVIS WITH CONTRAST  TECHNIQUE: Multidetector CT imaging of the abdomen and pelvis was performed using the standard protocol following bolus  administration of intravenous contrast. Sagittal and coronal MPR images reconstructed from axial data set.  CONTRAST:  146m OMNIPAQUE IOHEXOL 300 MG/ML SOLN ORALLY, 563mOMNIPAQUE IOHEXOL 300 MG/ML SOLN IV  COMPARISON:  CT ABD - PELV W/ CM dated 04/11/2011  FINDINGS: Minimal dependent atelectasis at lung bases.  Liver, spleen, pancreas, kidneys, and adrenal glands normal appearance.  Appendix surgically absent by history.  Beam hardening artifacts from bilateral hip prostheses obscure portions of pelvis.  Dilated proximal and decompressed distal small bowel loops compatible with small bowel obstruction.  Transition from dilated to nondilated small bowel occurs in the mid small bowel in the right mid abdomen.  No bowel wall thickening or free intraperitoneal air.  Colon and stomach unremarkable.  Visualized portion of uterus, bladder and ureters normal appearance.  No mass, adenopathy, or hernia.  Osseous demineralization.  IMPRESSION: Mid small bowel obstruction with transition zone in the right mid abdomen   Electronically Signed   By: MaLavonia Dana.D.   On: 03/02/2013 13:15    ROS: See chart Blood pressure 121/65, pulse 61, temperature 97.7 F (36.5 C), temperature source Oral, resp. rate 18, height 5' 8" (1.727 m), weight 79.833 kg (176 lb), SpO2 95.00%. Physical Exam: Pleasant white female in no acute distress. Abdomen is soft and nondistended. She does have some mild left-sided nonspecific abdominal pain. No rigidity is noted. She does have bowel sounds present. No hepatosplenomegaly, masses, or hernias identified. Rectal was deferred at this time.  Assessment/Plan: Impression: Small bowel obstruction versus ileus Plan: Will put the patient on bowel rest with ice chips only. Would place NG tube if patient starts having more emesis. Will reevaluate in a.m. No need for acute surgical intervention at this time.  JEHarriman 03/02/2013, 3:08 PM

## 2013-03-02 NOTE — ED Provider Notes (Signed)
CSN: [id number]     Arrival date & time 03/02/13  1003 History   First MD Initiated Contact with Patient 03/02/13 1023     Chief Complaint  Patient presents with  . Abdominal Pain   (Consider location/radiation/quality/duration/timing/severity/associated sxs/prior Treatment) HPI Comments: Patient is a 78 year old female with history of chronic constipation, GERD, high cholesterol, hypertension who presents today with abdominal pain, nausea vomiting, diarrhea. This began last night. It was a throbbing pain. She has associated fatigue and generalized weakness. She went to see her GI doctor this morning who was standing her to radiology for a CT scan. Her vomiting became so severe on the car ride to the hospital that she decided to just come straight to the emergency department. She reports that the vomiting did relieve some of her pain. Her GI doctor gave her Cipro and Flagyl. She has had prior cholecystectomy and appendectomy. She has associated back pain. She reports she has been moving lately and "overdoing it". The back pain is throbbing.  The history is provided by the patient. No language interpreter was used.    Past Medical History  Diagnosis Date  . GERD (gastroesophageal reflux disease)   . Chronic constipation   . Cough   . Nausea   . Hemorrhoids   . High cholesterol   . Asthma   . Environmental allergies   . Urgency of urination   . Arthritis   . Hypercholesteremia   . Hypertension   . Dysrhythmia     tachycardia  . Anxiety   . Depression   . Pneumonia     7+ YRS  . Fibromyalgia   . Cancer     skin CA  . Abdominal discomfort 11-02-12    suspected ?UTI- PCP MD placed on Cipro   Past Surgical History  Procedure Laterality Date  . Upper gastrointestinal endoscopy  08/03/2009    NUR  . Upper gastrointestinal endoscopy  03/29/2003    EGD ED TCS  . Colonoscopy  03/29/03  . Umbilical hernia repair  04/26/03    [facility]  . Cholecystectomy    . Appendectomy    . Eye  surgery      bilateral cataract removal  . Skin cancer excision      RIGHT NECK    . Tonsillectomy      T+A  . Back surgery      X2   . Total hip arthroplasty Right 09/14/2012    Procedure: RIGHT TOTAL HIP ARTHROPLASTY ANTERIOR APPROACH and RIGHT KNEE STEROID INJECTION;  Surgeon: Mcarthur Rossetti, MD;  Location: [facility];  Service: Orthopedics;  Laterality: Right;  . Total hip arthroplasty Left 11/05/2012    Procedure: LEFT TOTAL HIP ARTHROPLASTY ANTERIOR APPROACH;  Surgeon: Mcarthur Rossetti, MD;  Location: [facility];  Service: Orthopedics;  Laterality: Left;   Family History  Problem Relation Age of Onset  . Healthy Son   . Healthy Son   . Healthy Daughter    History  Substance Use Topics  . Smoking status: Current Some Day Smoker -- 0.25 packs/day for 50 years    Types: Cigarettes  . Smokeless tobacco: Never Used     Comment: Hasn't smoked in 5-6 weeks  . Alcohol Use: No   OB History   Grav Para Term Preterm Abortions TAB SAB Ect Mult Living                 Review of Systems  Constitutional: Negative for fever and chills.  Respiratory: Negative for shortness  of breath.   Cardiovascular: Negative for chest pain.  Gastrointestinal: Positive for nausea, vomiting, abdominal pain, diarrhea and abdominal distention.  Musculoskeletal: Positive for back pain.  All other systems reviewed and are negative.    Allergies  Codeine; Morphine and related; Other; and Oxycodone  Home Medications   Current Outpatient Rx  Name  Route  Sig  Dispense  Refill  . albuterol (PROVENTIL HFA;VENTOLIN HFA) 108 (90 BASE) MCG/ACT inhaler   Inhalation   Inhale 2 puffs into the lungs every 6 (six) hours as needed for wheezing or shortness of breath.         . allopurinol (ZYLOPRIM) 300 MG tablet   Oral   Take 300 mg by mouth daily.         Marland Kitchen ALPRAZolam (XANAX) 0.5 MG tablet   Oral   Take 0.5 mg by mouth 3 (three) times daily as needed for anxiety. Pt takes 3-4 tablets weekly          . amLODipine (NORVASC) 2.5 MG tablet   Oral   Take 2.5 mg by mouth every morning.          Marland Kitchen aspirin EC 325 MG EC tablet   Oral   Take 1 tablet (325 mg total) by mouth 2 (two) times daily after a meal.   45 tablet   0   . Biotin (BIOTIN 5000) 5 MG CAPS   Oral   Take 1 capsule by mouth every morning.         . ciprofloxacin (CIPRO) 500 MG tablet   Oral   Take 1 tablet (500 mg total) by mouth 2 (two) times daily.   20 tablet   0   . digoxin (LANOXIN) 0.25 MG tablet   Oral   Take 250 mcg by mouth every morning.          Marland Kitchen doxepin (SINEQUAN) 100 MG capsule   Oral   Take 100 mg by mouth at bedtime.          . fexofenadine (ALLEGRA) 60 MG tablet   Oral   Take 60 mg by mouth daily. Pt takes 1/2 tablet daily         . gabapentin (NEURONTIN) 300 MG capsule   Oral   Take 300 mg by mouth 2 (two) times daily.          . hyoscyamine (LEVSIN SL) 0.125 MG SL tablet   Sublingual   Place 2 tablets (0.25 mg total) under the tongue 3 (three) times daily as needed.   60 tablet   1   . metroNIDAZOLE (FLAGYL) 500 MG tablet   Oral   Take 1 tablet (500 mg total) by mouth 2 (two) times daily.   20 tablet   0   . Multiple Vitamin (MULITIVITAMIN WITH MINERALS) TABS   Oral   Take 1 tablet by mouth daily.         . naproxen (NAPROSYN) 500 MG tablet   Oral   Take 250 mg by mouth 2 (two) times daily with a meal.         . omeprazole (PRILOSEC) 20 MG capsule   Oral   Take 20 mg by mouth daily.         . pravastatin (PRAVACHOL) 20 MG tablet   Oral   Take 20 mg by mouth every morning. Pt takes it every other day         . Probiotic Product (PROBIOTIC PO)   Oral   Take by  mouth.         . psyllium (METAMUCIL) 58.6 % powder   Oral   Take 1 packet by mouth daily.          . quiNINE (QUALAQUIN) 324 MG capsule   Oral   Take 324 mg by mouth at bedtime.          . senna (SENOKOT) 8.6 MG tablet   Oral   Take 5 tablets by mouth 2 (two) times  daily.          Marland Kitchen zolpidem (AMBIEN) 10 MG tablet   Oral   Take 10 mg by mouth at bedtime as needed for sleep.          BP 119/84  Pulse 83  Temp(Src) 97.7 F (36.5 C) (Oral)  Resp 20  Ht 5\' 7"  (1.702 m)  Wt 168 lb (76.204 kg)  BMI 26.31 kg/m2  SpO2 98% Physical Exam  Nursing note and vitals reviewed. Constitutional: She is oriented to person, place, and time. She appears well-developed and well-nourished. No distress.  HENT:  Head: Normocephalic and atraumatic.  Right Ear: External ear normal.  Left Ear: External ear normal.  Nose: Nose normal.  Mouth/Throat: Oropharynx is clear and moist.  Eyes: Conjunctivae are normal.  Neck: Normal range of motion.  Cardiovascular: Normal rate, regular rhythm and normal heart sounds.   Pulmonary/Chest: Effort normal and breath sounds normal. No stridor. No respiratory distress. She has no wheezes. She has no rales.  Abdominal: Soft. She exhibits no distension. Bowel sounds are increased. There is tenderness in the left upper quadrant and left lower quadrant. There is guarding (Voluntary). There is no rigidity and no rebound.  Musculoskeletal: Normal range of motion.  Neurological: She is alert and oriented to person, place, and time. She has normal strength.  Skin: Skin is warm and dry. She is not diaphoretic. No erythema.  Psychiatric: She has a normal mood and affect. Her behavior is normal.    ED Course  Procedures (including critical care time) Labs Review Labs Reviewed  CBC WITH DIFFERENTIAL - Abnormal; Notable for the following:    Lymphocytes Relative 9 (*)    Monocytes Absolute 1.1 (*)    All other components within normal limits  COMPREHENSIVE METABOLIC PANEL - Abnormal; Notable for the following:    Glucose, Bld 131 (*)    GFR calc non Af Amer 83 (*)    All other components within normal limits  LIPASE, BLOOD  LACTIC ACID, PLASMA  URINALYSIS, ROUTINE W REFLEX MICROSCOPIC   Imaging Review Ct Abdomen Pelvis W  Contrast  03/02/2013   CLINICAL DATA:  Abdominal pain, history hypertension  EXAM: CT ABDOMEN AND PELVIS WITH CONTRAST  TECHNIQUE: Multidetector CT imaging of the abdomen and pelvis was performed using the standard protocol following bolus administration of intravenous contrast. Sagittal and coronal MPR images reconstructed from axial data set.  CONTRAST:  167mL OMNIPAQUE IOHEXOL 300 MG/ML SOLN ORALLY, 98mL OMNIPAQUE IOHEXOL 300 MG/ML SOLN IV  COMPARISON:  CT ABD - PELV W/ CM dated 04/11/2011  FINDINGS: Minimal dependent atelectasis at lung bases.  Liver, spleen, pancreas, kidneys, and adrenal glands normal appearance.  Appendix surgically absent by history.  Beam hardening artifacts from bilateral hip prostheses obscure portions of pelvis.  Dilated proximal and decompressed distal small bowel loops compatible with small bowel obstruction.  Transition from dilated to nondilated small bowel occurs in the mid small bowel in the right mid abdomen.  No bowel wall thickening or free intraperitoneal air.  Colon and stomach unremarkable.  Visualized portion of uterus, bladder and ureters normal appearance.  No mass, adenopathy, or hernia.  Osseous demineralization.  IMPRESSION: Mid small bowel obstruction with transition zone in the right mid abdomen   Electronically Signed   By: Lavonia Dana M.D.   On: 03/02/2013 13:15    EKG Interpretation   None      1:47 PM Discussed case with Dr. Arnoldo Morale. Will admit to medicine.   MDM   1. SBO (small bowel obstruction)    Patient presents to emergency department with abdominal pain, nausea, vomiting, diarrhea. CT of the abdomen and pelvis show a mid small bowel obstruction with transition zone in the right mid abdomen. I discussed this case with Dr. Arnoldo Morale. At Dr. Luan Pulling will admit. Vital signs stable at this time. Dr. Tomi Bamberger evaluated patient and agrees with plan. Patient / Family / Caregiver informed of clinical course, understand medical decision-making process, and  agree with plan.     Elwyn Lade, PA-C 03/02/13 1610

## 2013-03-02 NOTE — ED Provider Notes (Signed)
This chart was scribed for Janice Norrie, MD by Ludger Nutting, ED Scribe. This patient was seen in room [facility]05/[facility]05 and the patient's care was started 11:52 AM.     HPI Comments: KAMARIYAH TIMBERLAKE is a 78 y.o. female who presents to the Emergency Department complaining of left sided abdominal pain and nausea that began yesterday. She reports 10 episodes of diarrhea yesterday and 1 episode of vomiting this morning on the way to the ED. She denies any blood in her stools. States she feels listless.  She reports similar symptoms in the past and states this occurs ever 4-5 months. She denies fevers. She states she was treated with cipro and flagyl for these same symptoms about 3 weeks ago.   Pt is alert, cooperative. Abdomen is soft, with several prior surgical scars (hyst, cholecystectomy). She is tender in the LLQ without guarding or rebound.   Medical screening examination/treatment/procedure(s) were conducted as a shared visit with non-physician practitioner(s) and myself.  I personally evaluated the patient during the encounter.  EKG Interpretation   None        Rolland Porter, MD, FACEP   I personally performed the services described in this documentation, which was scribed in my presence. The recorded information has been reviewed and considered.  Rolland Porter, MD, FACEP     Janice Norrie, MD 03/02/13 1400

## 2013-03-02 NOTE — ED Notes (Signed)
Pt reports started feeling like "someone beat me" yesterday.  Last night started having diarrhea and vomiting, and llq pain.  Pt also c/o headache and dizziness.

## 2013-03-02 NOTE — Addendum Note (Signed)
Addended by: Rogene Houston on: 03/02/2013 09:30 AM   Modules accepted: Orders

## 2013-03-02 NOTE — ED Provider Notes (Signed)
See prior note   Janice Norrie, MD 03/02/13 [phone]

## 2013-03-03 LAB — BASIC METABOLIC PANEL
BUN: 11 mg/dL (ref 6–23)
CO2: 26 mEq/L (ref 19–32)
Calcium: 8.4 mg/dL (ref 8.4–10.5)
Chloride: 105 mEq/L (ref 96–112)
Creatinine, Ser: 0.6 mg/dL (ref 0.50–1.10)
GFR calc Af Amer: >90 mL/min (ref >90)
GFR calc non Af Amer: 87 mL/min — ABNORMAL LOW (ref >90)
Glucose, Bld: 84 mg/dL (ref 70–99)
Potassium: 3.6 mEq/L — ABNORMAL LOW (ref 3.7–5.3)
Sodium: 142 mEq/L (ref 137–147)

## 2013-03-03 LAB — CBC
HCT: 35 % — ABNORMAL LOW (ref 36.0–46.0)
Hemoglobin: 11.6 g/dL — ABNORMAL LOW (ref 12.0–15.0)
MCH: 32 pg (ref 26.0–34.0)
MCHC: 33.1 g/dL (ref 30.0–36.0)
MCV: 96.4 fL (ref 78.0–100.0)
Platelets: 148 10*3/uL — ABNORMAL LOW (ref 150–400)
RBC: 3.63 MIL/uL — ABNORMAL LOW (ref 3.87–5.11)
RDW: 15.2 % (ref 11.5–15.5)
WBC: 4.4 10*3/uL (ref 4.0–10.5)

## 2013-03-03 MED ORDER — ASPIRIN EC 81 MG PO TBEC
81.0000 mg | DELAYED_RELEASE_TABLET | Freq: Every day | ORAL | Status: DC
  Administered 2013-03-03 – 2013-03-04 (×2): 81 mg via ORAL
  Filled 2013-03-03 (×2): qty 1

## 2013-03-03 MED ORDER — PANTOPRAZOLE SODIUM 40 MG PO TBEC
40.0000 mg | DELAYED_RELEASE_TABLET | Freq: Every day | ORAL | Status: DC
Start: 2013-03-03 — End: 2013-03-04
  Administered 2013-03-03 – 2013-03-04 (×2): 40 mg via ORAL
  Filled 2013-03-03 (×2): qty 1

## 2013-03-03 MED ORDER — AMLODIPINE BESYLATE 5 MG PO TABS
2.5000 mg | ORAL_TABLET | ORAL | Status: DC
  Administered 2013-03-03 – 2013-03-04 (×2): 2.5 mg via ORAL
  Filled 2013-03-03 (×2): qty 1

## 2013-03-03 MED ORDER — LORATADINE 10 MG PO TABS
10.0000 mg | ORAL_TABLET | Freq: Every day | ORAL | Status: DC
  Administered 2013-03-03 – 2013-03-04 (×2): 10 mg via ORAL
  Filled 2013-03-03 (×2): qty 1

## 2013-03-03 MED ORDER — ZOLPIDEM TARTRATE 5 MG PO TABS: 5.0000 mg | ORAL_TABLET | Freq: Every evening | ORAL | Status: DC | PRN

## 2013-03-03 MED ORDER — BIOTIN 5 MG PO CAPS: 1.0000 | ORAL_CAPSULE | ORAL | Status: DC

## 2013-03-03 MED ORDER — DIGOXIN 125 MCG PO TABS
250.0000 ug | ORAL_TABLET | Freq: Every morning | ORAL | Status: DC
  Administered 2013-03-03: 250 ug via ORAL
  Filled 2013-03-03 (×2): qty 2

## 2013-03-03 MED ORDER — ALPRAZOLAM 0.5 MG PO TABS: 0.5000 mg | ORAL_TABLET | Freq: Three times a day (TID) | ORAL | Status: DC | PRN

## 2013-03-03 MED ORDER — DOXEPIN HCL 25 MG PO CAPS
100.0000 mg | ORAL_CAPSULE | Freq: Every day | ORAL | Status: DC
  Administered 2013-03-03: 100 mg via ORAL
  Filled 2013-03-03: qty 4

## 2013-03-03 MED ORDER — ALBUTEROL SULFATE (2.5 MG/3ML) 0.083% IN NEBU: 3.0000 mL | INHALATION_SOLUTION | Freq: Four times a day (QID) | RESPIRATORY_TRACT | Status: DC | PRN

## 2013-03-03 MED ORDER — GABAPENTIN 300 MG PO CAPS
300.0000 mg | ORAL_CAPSULE | Freq: Two times a day (BID) | ORAL | Status: DC
  Administered 2013-03-03 – 2013-03-04 (×3): 300 mg via ORAL
  Filled 2013-03-03 (×3): qty 1

## 2013-03-03 MED ORDER — ALLOPURINOL 300 MG PO TABS
300.0000 mg | ORAL_TABLET | Freq: Every day | ORAL | Status: DC
  Administered 2013-03-03 – 2013-03-04 (×2): 300 mg via ORAL
  Filled 2013-03-03 (×2): qty 1

## 2013-03-03 NOTE — Care Management Note (Addendum)
    Page 1 of 1   03/04/2013     11:23:35 AM   CARE MANAGEMENT NOTE 03/04/2013  Patient:  Zoe [name], Zoe [name]   Account Number:  [id number]  Date Initiated:  03/03/2013  Documentation initiated by:  Theophilus Kinds  Subjective/Objective Assessment:   Pt admitted from home with SBO. Pt lives with her husband and will return home at discharge. Pt is independent with ADL's.     Action/Plan:   No CM needs noted.   Anticipated DC Date:  03/05/2013   Anticipated DC Plan:  [place]  CM consult      Choice offered to / List presented to:             Status of service:  Completed, signed off Medicare Important Message given?  YES (If response is "NO", the following Medicare IM given date fields will be blank) Date Medicare IM given:  03/04/2013 Date Additional Medicare IM given:    Discharge Disposition:  HOME/SELF CARE  Per UR Regulation:    If discussed at Long Length of Stay Meetings, dates discussed:    Comments:  03/03/13 [place], RN BSN CM

## 2013-03-03 NOTE — Progress Notes (Signed)
UR completed. Patient changed to inpatient- requiring IVF @ 77[facility] SBO

## 2013-03-03 NOTE — Progress Notes (Signed)
  Subjective: Have multiple bowel movements yesterday evening. Still has nonspecific left-sided abdominal pain which she has had for years. She has passed flatus.  Objective: Vital signs in last 24 hours: Temp:  [97.7 F (36.5 C)-98.2 F (36.8 C)] 98.1 F (36.7 C) (02/05 0656) Pulse Rate:  [56-83] 56 (02/05 0656) Resp:  [15-20] 15 (02/05 0656) BP: (97-134)/(49-84) 97/49 mmHg (02/05 0656) SpO2:  [95 %-98 %] 96 % (02/05 0656) Weight:  [76.204 kg (168 lb)-79.833 kg (176 lb)] 79.833 kg (176 lb) (02/04 1445) Last BM Date: 03/01/13  Intake/Output from previous day:   Intake/Output this shift:    General appearance: alert, cooperative and no distress GI: soft, non-tender; bowel sounds normal; no masses,  no organomegaly  Lab Results:   Recent Labs  03/02/13 1050 03/03/13 0450  WBC 9.0 4.4  HGB 14.3 11.6*  HCT 41.2 35.0*  PLT 178 148*   BMET  Recent Labs  03/02/13 1050 03/03/13 0450  NA 139 142  K 4.2 3.6*  CL 100 105  CO2 24 26  GLUCOSE 131* 84  BUN 17 11  CREATININE 0.68 0.60  CALCIUM 9.3 8.4   PT/INR No results found for this basename: LABPROT, INR,  in the last 72 hours  Studies/Results: Ct Abdomen Pelvis W Contrast  03/02/2013   CLINICAL DATA:  Abdominal pain, history hypertension  EXAM: CT ABDOMEN AND PELVIS WITH CONTRAST  TECHNIQUE: Multidetector CT imaging of the abdomen and pelvis was performed using the standard protocol following bolus administration of intravenous contrast. Sagittal and coronal MPR images reconstructed from axial data set.  CONTRAST:  114mL OMNIPAQUE IOHEXOL 300 MG/ML SOLN ORALLY, 12mL OMNIPAQUE IOHEXOL 300 MG/ML SOLN IV  COMPARISON:  CT ABD - PELV W/ CM dated 04/11/2011  FINDINGS: Minimal dependent atelectasis at lung bases.  Liver, spleen, pancreas, kidneys, and adrenal glands normal appearance.  Appendix surgically absent by history.  Beam hardening artifacts from bilateral hip prostheses obscure portions of pelvis.  Dilated proximal and  decompressed distal small bowel loops compatible with small bowel obstruction.  Transition from dilated to nondilated small bowel occurs in the mid small bowel in the right mid abdomen.  No bowel wall thickening or free intraperitoneal air.  Colon and stomach unremarkable.  Visualized portion of uterus, bladder and ureters normal appearance.  No mass, adenopathy, or hernia.  Osseous demineralization.  IMPRESSION: Mid small bowel obstruction with transition zone in the right mid abdomen   Electronically Signed   By: Lavonia Dana M.D.   On: 03/02/2013 13:15    Anti-infectives: Anti-infectives   None      Assessment/Plan: Impression: Partial small bowel obstruction, resolving. Has had loose stools. Plan: No need for acute surgical intervention. We'll advance diet as tolerated.  LOS: 1 day    Shakeitha Umbaugh A 03/03/2013

## 2013-03-03 NOTE — Progress Notes (Signed)
Subjective: She feels much better. She's not having abdominal pain. She has had several bowel movements.  Objective: Vital signs in last 24 hours: Temp:  [97.7 F (36.5 C)-98.2 F (36.8 C)] 98.1 F (36.7 C) (02/05 0656) Pulse Rate:  [56-83] 56 (02/05 0656) Resp:  [15-20] 15 (02/05 0656) BP: (97-134)/(49-84) 97/49 mmHg (02/05 0656) SpO2:  [95 %-98 %] 96 % (02/05 0656) Weight:  [76.204 kg (168 lb)-79.833 kg (176 lb)] 79.833 kg (176 lb) (02/04 1445) Weight change:  Last BM Date: 03/01/13  Intake/Output from previous day:    PHYSICAL EXAM General appearance: alert, cooperative and mild distress Resp: clear to auscultation bilaterally Cardio: regular rate and rhythm, S1, S2 normal, no murmur, click, rub or gallop GI: She still has left upper and lower quadrant tenderness but that is chronic. Bowel sounds are present and active. Abdomen is otherwise soft Extremities: extremities normal, atraumatic, no cyanosis or edema  Lab Results:    Basic Metabolic Panel:  Recent Labs  03/02/13 1050 03/03/13 0450  NA 139 142  K 4.2 3.6*  CL 100 105  CO2 24 26  GLUCOSE 131* 84  BUN 17 11  CREATININE 0.68 0.60  CALCIUM 9.3 8.4   Liver Function Tests:  Recent Labs  03/02/13 1050  AST 19  ALT 15  ALKPHOS 72  BILITOT 0.8  PROT 7.5  ALBUMIN 3.9    Recent Labs  03/02/13 1050  LIPASE 19   No results found for this basename: AMMONIA,  in the last 72 hours CBC:  Recent Labs  03/02/13 1050 03/03/13 0450  WBC 9.0 4.4  NEUTROABS 6.8  --   HGB 14.3 11.6*  HCT 41.2 35.0*  MCV 94.5 96.4  PLT 178 148*   Cardiac Enzymes: No results found for this basename: CKTOTAL, CKMB, CKMBINDEX, TROPONINI,  in the last 72 hours BNP: No results found for this basename: PROBNP,  in the last 72 hours D-Dimer: No results found for this basename: DDIMER,  in the last 72 hours CBG: No results found for this basename: GLUCAP,  in the last 72 hours Hemoglobin A1C: No results found for this  basename: HGBA1C,  in the last 72 hours Fasting Lipid Panel: No results found for this basename: CHOL, HDL, LDLCALC, TRIG, CHOLHDL, LDLDIRECT,  in the last 72 hours Thyroid Function Tests: No results found for this basename: TSH, T4TOTAL, FREET4, T3FREE, THYROIDAB,  in the last 72 hours Anemia Panel: No results found for this basename: VITAMINB12, FOLATE, FERRITIN, TIBC, IRON, RETICCTPCT,  in the last 72 hours Coagulation: No results found for this basename: LABPROT, INR,  in the last 72 hours Urine Drug Screen: Drugs of Abuse  No results found for this basename: labopia, cocainscrnur, labbenz, amphetmu, thcu, labbarb    Alcohol Level: No results found for this basename: ETH,  in the last 72 hours Urinalysis:  Recent Labs  03/02/13 1015  COLORURINE YELLOW  LABSPEC <1.005*  PHURINE 5.5  GLUCOSEU NEGATIVE  HGBUR NEGATIVE  BILIRUBINUR NEGATIVE  KETONESUR NEGATIVE  PROTEINUR NEGATIVE  UROBILINOGEN 0.2  NITRITE NEGATIVE  [place]. Labs:  ABGS No results found for this basename: PHART, PCO2, PO2ART, TCO2, HCO3,  in the last 72 hours CULTURES No results found for this or any previous visit (from the past 240 hour(s)). Studies/Results: Ct Abdomen Pelvis W Contrast  03/02/2013   CLINICAL DATA:  Abdominal pain, history hypertension  EXAM: CT ABDOMEN AND PELVIS WITH CONTRAST  TECHNIQUE: Multidetector CT imaging of the abdomen and pelvis  was performed using the standard protocol following bolus administration of intravenous contrast. Sagittal and coronal MPR images reconstructed from axial data set.  CONTRAST:  176mL OMNIPAQUE IOHEXOL 300 MG/ML SOLN ORALLY, 59mL OMNIPAQUE IOHEXOL 300 MG/ML SOLN IV  COMPARISON:  CT ABD - PELV W/ CM dated 04/11/2011  FINDINGS: Minimal dependent atelectasis at lung bases.  Liver, spleen, pancreas, kidneys, and adrenal glands normal appearance.  Appendix surgically absent by history.  Beam hardening artifacts from bilateral hip prostheses  obscure portions of pelvis.  Dilated proximal and decompressed distal small bowel loops compatible with small bowel obstruction.  Transition from dilated to nondilated small bowel occurs in the mid small bowel in the right mid abdomen.  No bowel wall thickening or free intraperitoneal air.  Colon and stomach unremarkable.  Visualized portion of uterus, bladder and ureters normal appearance.  No mass, adenopathy, or hernia.  Osseous demineralization.  IMPRESSION: Mid small bowel obstruction with transition zone in the right mid abdomen   Electronically Signed   By: Lavonia Dana M.D.   On: 03/02/2013 13:15    Medications:  Prior to Admission:  Prescriptions prior to admission  Medication Sig Dispense Refill  . albuterol (PROVENTIL HFA;VENTOLIN HFA) 108 (90 BASE) MCG/ACT inhaler Inhale 2 puffs into the lungs every 6 (six) hours as needed for wheezing or shortness of breath.      . allopurinol (ZYLOPRIM) 300 MG tablet Take 300 mg by mouth daily.      Marland Kitchen ALPRAZolam (XANAX) 0.5 MG tablet Take 0.5 mg by mouth 3 (three) times daily as needed for anxiety. Pt takes 3-4 tablets weekly      . amLODipine (NORVASC) 2.5 MG tablet Take 2.5 mg by mouth every morning.       Marland Kitchen aspirin EC 81 MG tablet Take 81 mg by mouth daily.      . Biotin (BIOTIN 5000) 5 MG CAPS Take 1 capsule by mouth every morning.      . digoxin (LANOXIN) 0.25 MG tablet Take 250 mcg by mouth every morning.       Marland Kitchen doxepin (SINEQUAN) 100 MG capsule Take 100 mg by mouth at bedtime.       . fexofenadine (ALLEGRA) 60 MG tablet Take 60 mg by mouth daily. Pt takes 1/2 tablet daily      . gabapentin (NEURONTIN) 300 MG capsule Take 300 mg by mouth 2 (two) times daily.       . Multiple Vitamin (MULITIVITAMIN WITH MINERALS) TABS Take 1 tablet by mouth daily.      . naproxen (NAPROSYN) 500 MG tablet Take 250 mg by mouth 2 (two) times daily with a meal.      . omeprazole (PRILOSEC) 20 MG capsule Take 20 mg by mouth daily.      . pravastatin (PRAVACHOL) 20  MG tablet Take 20 mg by mouth every morning. Pt takes it every other day      . psyllium (METAMUCIL) 58.6 % powder Take 1 packet by mouth daily.       . quiNINE (QUALAQUIN) 324 MG capsule Take 324 mg by mouth at bedtime.       . senna (SENOKOT) 8.6 MG tablet Take 5 tablets by mouth 2 (two) times daily.       Marland Kitchen zolpidem (AMBIEN) 10 MG tablet Take 10 mg by mouth at bedtime as needed for sleep.       Scheduled: . allopurinol  300 mg Oral Daily  . amLODipine  2.5 mg Oral BH-q7a  . aspirin  EC  81 mg Oral Daily  . Biotin  1 capsule Oral BH-q7a  . digoxin  250 mcg Oral q morning - 10a  . doxepin  100 mg Oral QHS  . enoxaparin (LOVENOX) injection  40 mg Subcutaneous Q24H  . gabapentin  300 mg Oral BID  . loratadine  10 mg Oral Daily  . pantoprazole  40 mg Oral Daily   Continuous: . sodium chloride 75 mL/hr at 03/02/13 1836   [id number], acetaminophen, albuterol, ALPRAZolam, fentaNYL, fentaNYL, ondansetron (ZOFRAN) IV, ondansetron (ZOFRAN) IV, ondansetron, zolpidem  Assesment: She has small bowel obstruction this seems better. She has multiple other medical problems which are stable Active Problems:   SBO (small bowel obstruction)    Plan: She's going to advanced her diet and high will restart her meds.    LOS: 1 day   Eugean Arnott L 03/03/2013, 8:54 AM

## 2013-03-03 NOTE — H&P (Signed)
Zoe [name], Zoe [name]              ACCOUNT NO.:  [id number]  MEDICAL RECORD NO.:  [id number]  LOCATION:  [facility]                         FACILITY:  [facility]  PHYSICIAN:  Cicero Noy L. Luan Pulling, M.D.DATE OF BIRTH:  1935-08-20  DATE OF ADMISSION:  03/02/2013 DATE OF DISCHARGE:  LH                             HISTORY & PHYSICAL   REASON FOR ADMISSION:  Small bowel obstruction.  HISTORY OF PRESENT ILLNESS:  This is a 78 year old, who has been in her usual state of fair health at home when she developed abdominal discomfort, nausea, vomiting, and some diarrhea.  She came to the emergency department where she was found to have what looked like a small bowel obstruction on CT.  She was not able to resolve her symptoms and she was admitted because of that.  She has been having chronic left- sided abdominal pain and has been seeing Dr. Shonna Chock for that and it has continued but she has never had an episode like this.  PAST MEDICAL HISTORY:  Positive for GERD, chronic constipation, cough, nausea, hyperlipidemia, hypertension, cardiac arrhythmias, anxiety and depression, fibromyalgia, chronic abdominal discomfort, and she has had osteoarthritis and has had hip replacement.  She has had an upper GI endoscopy, colonoscopy, and umbilical hernia repair that what done by Dr. Arnoldo Morale, cholecystectomy, appendectomy, bilateral cataract removals, skin cancer on the right side of the neck, tonsillectomy in childhood, back surgery x2, hip surgeries bilaterally.  FAMILY HISTORY:  Generally is of good health.  SOCIAL HISTORY:  She has about a 30-pack-year smoking history.  She does not use any alcohol.  ALLERGIES:  She is allergic to codeine, morphine, Novocain, and oxycodone.  MEDICATIONS:  Reviewed.  PHYSICAL EXAMINATION:  GENERAL:  She is awake and alert.  She is complaining of some abdominal pain. VITAL SIGNS:  Her temp is 97.7, pulse 61 respirations 18, blood pressure 119/84. HEENT:  Pupils are reactive.   Nose and throat are clear.  Mucous membranes are moist. NECK:  Supple. CHEST:  Show some rhonchi. HEART:  Regular without murmur, gallop, or rub. ABDOMEN:  Mildly tender diffusely, and she has hyperactive bowel sounds.  CT showed what looks like small bowel obstruction versus ileus.  ASSESSMENT:  She has what may be a small bowel obstruction.  She has had previous surgery on her abdomen.  PLAN:  She is going to be n.p.o.  She is going to have laboratory work, ice chips pain medications, and she will continue everything else and follow.  Re-evaluate in the morning.     Zoe [name] L. Luan Pulling, M.D.     ELH/MEDQ  D:  03/02/2013  T:  03/03/2013  Job:  [id number]

## 2013-03-04 ENCOUNTER — Inpatient Hospital Stay (HOSPITAL_COMMUNITY): Payer: Medicare Other

## 2013-03-04 NOTE — Progress Notes (Signed)
Subjective: She feels better. She still complains of abdominal discomfort. She has no other new complaints.  Objective: Vital signs in last 24 hours: Temp:  [97.6 F (36.4 C)-97.8 F (36.6 C)] 97.8 F (36.6 C) (02/06 0425) Pulse Rate:  [51-52] 52 (02/06 0425) Resp:  [18-20] 20 (02/06 0425) BP: (112-125)/(57-64) 112/57 mmHg (02/06 0425) SpO2:  [95 %-98 %] 98 % (02/06 0425) Weight change:  Last BM Date: 03/03/13  Intake/Output from previous day: 02/05 0701 - 02/06 0700 In: 2886.3 [P.O.:240; I.V.:2646.3] Out: -   PHYSICAL EXAM General appearance: alert, cooperative and no distress Resp: clear to auscultation bilaterally Cardio: regular rate and rhythm, S1, S2 normal, no murmur, click, rub or gallop GI: She is mildly tender just to the left of the umbilicus Extremities: extremities normal, atraumatic, no cyanosis or edema  Lab Results:    Basic Metabolic Panel:  Recent Labs  03/02/13 1050 03/03/13 0450  NA 139 142  K 4.2 3.6*  CL 100 105  CO2 24 26  GLUCOSE 131* 84  BUN 17 11  CREATININE 0.68 0.60  CALCIUM 9.3 8.4   Liver Function Tests:  Recent Labs  03/02/13 1050  AST 19  ALT 15  ALKPHOS 72  BILITOT 0.8  PROT 7.5  ALBUMIN 3.9    Recent Labs  03/02/13 1050  LIPASE 19   No results found for this basename: AMMONIA,  in the last 72 hours CBC:  Recent Labs  03/02/13 1050 03/03/13 0450  WBC 9.0 4.4  NEUTROABS 6.8  --   HGB 14.3 11.6*  HCT 41.2 35.0*  MCV 94.5 96.4  PLT 178 148*   Cardiac Enzymes: No results found for this basename: CKTOTAL, CKMB, CKMBINDEX, TROPONINI,  in the last 72 hours BNP: No results found for this basename: PROBNP,  in the last 72 hours D-Dimer: No results found for this basename: DDIMER,  in the last 72 hours CBG: No results found for this basename: GLUCAP,  in the last 72 hours Hemoglobin A1C: No results found for this basename: HGBA1C,  in the last 72 hours Fasting Lipid Panel: No results found for this  basename: CHOL, HDL, LDLCALC, TRIG, CHOLHDL, LDLDIRECT,  in the last 72 hours Thyroid Function Tests: No results found for this basename: TSH, T4TOTAL, FREET4, T3FREE, THYROIDAB,  in the last 72 hours Anemia Panel: No results found for this basename: VITAMINB12, FOLATE, FERRITIN, TIBC, IRON, RETICCTPCT,  in the last 72 hours Coagulation: No results found for this basename: LABPROT, INR,  in the last 72 hours Urine Drug Screen: Drugs of Abuse  No results found for this basename: labopia, cocainscrnur, labbenz, amphetmu, thcu, labbarb    Alcohol Level: No results found for this basename: ETH,  in the last 72 hours Urinalysis:  Recent Labs  03/02/13 1015  COLORURINE YELLOW  LABSPEC <1.005*  PHURINE 5.5  GLUCOSEU NEGATIVE  HGBUR NEGATIVE  BILIRUBINUR NEGATIVE  KETONESUR NEGATIVE  PROTEINUR NEGATIVE  UROBILINOGEN 0.2  NITRITE NEGATIVE  [place]. Labs:  ABGS No results found for this basename: PHART, PCO2, PO2ART, TCO2, HCO3,  in the last 72 hours CULTURES No results found for this or any previous visit (from the past 240 hour(s)). Studies/Results: Ct Abdomen Pelvis W Contrast  03/02/2013   CLINICAL DATA:  Abdominal pain, history hypertension  EXAM: CT ABDOMEN AND PELVIS WITH CONTRAST  TECHNIQUE: Multidetector CT imaging of the abdomen and pelvis was performed using the standard protocol following bolus administration of intravenous contrast. Sagittal and coronal MPR images  reconstructed from axial data set.  CONTRAST:  150mL OMNIPAQUE IOHEXOL 300 MG/ML SOLN ORALLY, 32mL OMNIPAQUE IOHEXOL 300 MG/ML SOLN IV  COMPARISON:  CT ABD - PELV W/ CM dated 04/11/2011  FINDINGS: Minimal dependent atelectasis at lung bases.  Liver, spleen, pancreas, kidneys, and adrenal glands normal appearance.  Appendix surgically absent by history.  Beam hardening artifacts from bilateral hip prostheses obscure portions of pelvis.  Dilated proximal and decompressed distal small bowel loops  compatible with small bowel obstruction.  Transition from dilated to nondilated small bowel occurs in the mid small bowel in the right mid abdomen.  No bowel wall thickening or free intraperitoneal air.  Colon and stomach unremarkable.  Visualized portion of uterus, bladder and ureters normal appearance.  No mass, adenopathy, or hernia.  Osseous demineralization.  IMPRESSION: Mid small bowel obstruction with transition zone in the right mid abdomen   Electronically Signed   By: Lavonia Dana M.D.   On: 03/02/2013 13:15    Medications:  Prior to Admission:  Prescriptions prior to admission  Medication Sig Dispense Refill  . albuterol (PROVENTIL HFA;VENTOLIN HFA) 108 (90 BASE) MCG/ACT inhaler Inhale 2 puffs into the lungs every 6 (six) hours as needed for wheezing or shortness of breath.      . allopurinol (ZYLOPRIM) 300 MG tablet Take 300 mg by mouth daily.      Marland Kitchen ALPRAZolam (XANAX) 0.5 MG tablet Take 0.5 mg by mouth 3 (three) times daily as needed for anxiety. Pt takes 3-4 tablets weekly      . amLODipine (NORVASC) 2.5 MG tablet Take 2.5 mg by mouth every morning.       Marland Kitchen aspirin EC 81 MG tablet Take 81 mg by mouth daily.      . Biotin (BIOTIN 5000) 5 MG CAPS Take 1 capsule by mouth every morning.      . digoxin (LANOXIN) 0.25 MG tablet Take 250 mcg by mouth every morning.       Marland Kitchen doxepin (SINEQUAN) 100 MG capsule Take 100 mg by mouth at bedtime.       . fexofenadine (ALLEGRA) 60 MG tablet Take 60 mg by mouth daily. Pt takes 1/2 tablet daily      . gabapentin (NEURONTIN) 300 MG capsule Take 300 mg by mouth 2 (two) times daily.       . Multiple Vitamin (MULITIVITAMIN WITH MINERALS) TABS Take 1 tablet by mouth daily.      . naproxen (NAPROSYN) 500 MG tablet Take 250 mg by mouth 2 (two) times daily with a meal.      . omeprazole (PRILOSEC) 20 MG capsule Take 20 mg by mouth daily.      . pravastatin (PRAVACHOL) 20 MG tablet Take 20 mg by mouth every morning. Pt takes it every other day      . psyllium  (METAMUCIL) 58.6 % powder Take 1 packet by mouth daily.       . quiNINE (QUALAQUIN) 324 MG capsule Take 324 mg by mouth at bedtime.       . senna (SENOKOT) 8.6 MG tablet Take 5 tablets by mouth 2 (two) times daily.       Marland Kitchen zolpidem (AMBIEN) 10 MG tablet Take 10 mg by mouth at bedtime as needed for sleep.       Scheduled: . allopurinol  300 mg Oral Daily  . amLODipine  2.5 mg Oral BH-q7a  . aspirin EC  81 mg Oral Daily  . digoxin  250 mcg Oral q morning - 10a  .  doxepin  100 mg Oral QHS  . enoxaparin (LOVENOX) injection  40 mg Subcutaneous Q24H  . gabapentin  300 mg Oral BID  . loratadine  10 mg Oral Daily  . pantoprazole  40 mg Oral Daily   Continuous: . sodium chloride 75 mL/hr at 03/03/13 2147   RUE:[id number], acetaminophen, albuterol, ALPRAZolam, fentaNYL, fentaNYL, ondansetron (ZOFRAN) IV, ondansetron (ZOFRAN) IV, ondansetron, zolpidem  Assesment: She was admitted with small bowel obstruction. This has resolved. She has chronic left-sided abdominal pain which is still present. She is requesting GI consultation to see if there's anything that can be done about this pain. She understands that this may require outpatient workup Active Problems:   SBO (small bowel obstruction)    Plan: GI consult. If she does not need anything done inpatient I will discharge her    LOS: 2 days   Ignazio Kincaid L 03/04/2013, 8:53 AM

## 2013-03-04 NOTE — Progress Notes (Signed)
Pt discharged with instructions and care notes.  She verbalized understanding and voices no further complaints or concerns at this time.  Pt left the floor via w/c with staff and her husband in stable condition.

## 2013-03-04 NOTE — Consult Note (Signed)
Reason for Consult: abdominal pain  Referring Physician:  [name] is an 78 y.o. female.  HPI: Admitted thru the ED Wednesday with c/o left lower abdomen. Rated  The pain 10/10.  The pain lasted all night. She had episodes of diarrhea. She thinks she may have had 10 loose stools. She had one episode of vomiting when going to the ED.  She had 2 loose stools yesterday.  No diarrhea today. She did have chills. She cannot tell me if she had a fever. 3-4 weeks ago she was on Cipro and Flagyl for 10 days for possible diverticulitis by Dr. Laural [name]. She has had this pain off and on for several years.  Appetite is good. 10 weight loss unintentional. Usually has 1 stool a day or every other day. Has hx of chronic constipation. Takes Senakot for her constipation and her stools are always loose.  03/02/2013 CT abdomen/pelvis with CM:  Osseous demineralization.  IMPRESSION:  Mid small bowel obstruction with transition zone in the right mid  abdomen    In March 2005; FINAL DIAGNOSES:  1. Small sliding hiatal hernia.  2. Nonerosive antral gastritis.  3. Esophagus dilated by passing 56 French Maloney dilator given history of  dysphagia, but this does not produce any mucosal injury.  4. Melanosis coli, otherwise normal redundant colon.  of 2005 EGD/Colonoscopy:    Past Medical History  Diagnosis Date  . GERD (gastroesophageal reflux disease)   . Chronic constipation   . Cough   . Nausea   . Hemorrhoids   . High cholesterol   . Asthma   . Environmental allergies   . Urgency of urination   . Arthritis   . Hypercholesteremia   . Hypertension   . Dysrhythmia     tachycardia  . Anxiety   . Depression   . Pneumonia     7+ YRS  . Fibromyalgia   . Cancer     skin CA  . Abdominal discomfort 11-02-12    suspected ?UTI- PCP MD placed on Cipro    Past Surgical History  Procedure Laterality Date  . Upper gastrointestinal endoscopy  08/03/2009    NUR  . Upper  gastrointestinal endoscopy  03/29/2003    EGD ED TCS  . Colonoscopy  03/29/03  . Umbilical hernia repair  04/26/03    JENKINS  . Cholecystectomy    . Appendectomy    . Eye surgery      bilateral cataract removal  . Skin cancer excision      RIGHT NECK    . Tonsillectomy      T+A  . Back surgery      X2   . Total hip arthroplasty Right 09/14/2012    Procedure: RIGHT TOTAL HIP ARTHROPLASTY ANTERIOR APPROACH and RIGHT KNEE STEROID INJECTION;  Surgeon: Mcarthur Rossetti, MD;  Location: [facility];  Service: Orthopedics;  Laterality: Right;  . Total hip arthroplasty Left 11/05/2012    Procedure: LEFT TOTAL HIP ARTHROPLASTY ANTERIOR APPROACH;  Surgeon: Mcarthur Rossetti, MD;  Location: [facility];  Service: Orthopedics;  Laterality: Left;    Family History  Problem Relation Age of Onset  . Healthy Son   . Healthy Son   . Healthy Daughter     Social History:  reports that she has been smoking Cigarettes.  She has a 12.5 pack-year smoking history. She has never used smokeless tobacco. She reports that she does not drink alcohol or use illicit drugs.  Allergies:  Allergies  Allergen  Reactions  . Codeine Other (See Comments)    Pains in abdominal area  . Morphine And Related     Abdominal pain  . Other     NOVACAINE     ITCHING  . Oxycodone     Abdominal pain    Medications: I have reviewed the patient's current medications.  Results for orders placed during the hospital encounter of 03/02/13 (from the past 48 hour(s))  URINALYSIS, ROUTINE W REFLEX MICROSCOPIC     Status: Abnormal   Collection Time    03/02/13 10:15 AM      Result Value Range   Color, Urine YELLOW  YELLOW   APPearance CLEAR  CLEAR   Specific Gravity, Urine <1.005 (*) 1.005 - 1.030   pH 5.5  5.0 - 8.0   Glucose, UA NEGATIVE  NEGATIVE mg/dL   Hgb urine dipstick NEGATIVE  NEGATIVE   Bilirubin Urine NEGATIVE  NEGATIVE   Ketones, ur NEGATIVE  NEGATIVE mg/dL   Protein, ur NEGATIVE  NEGATIVE mg/dL    Urobilinogen, UA 0.2  0.0 - 1.0 mg/dL   Nitrite NEGATIVE  NEGATIVE   Leukocytes, UA NEGATIVE  NEGATIVE   Comment: MICROSCOPIC NOT DONE ON URINES WITH NEGATIVE PROTEIN, BLOOD, LEUKOCYTES, NITRITE, OR GLUCOSE <1000 mg/dL.  CBC WITH DIFFERENTIAL     Status: Abnormal   Collection Time    03/02/13 10:50 AM      Result Value Range   WBC 9.0  4.0 - 10.5 K/uL   RBC 4.36  3.87 - 5.11 MIL/uL   Hemoglobin 14.3  12.0 - 15.0 g/dL   HCT 41.2  36.0 - 46.0 %   MCV 94.5  78.0 - 100.0 fL   MCH 32.8  26.0 - 34.0 pg   MCHC 34.7  30.0 - 36.0 g/dL   RDW 15.0  11.5 - 15.5 %   Platelets 178  150 - 400 K/uL   Neutrophils Relative % 76  43 - 77 %   Neutro Abs 6.8  1.7 - 7.7 K/uL   Lymphocytes Relative 9 (*) 12 - 46 %   Lymphs Abs 0.8  0.7 - 4.0 K/uL   Monocytes Relative 12  3 - 12 %   Monocytes Absolute 1.1 (*) 0.1 - 1.0 K/uL   Eosinophils Relative 3  0 - 5 %   Eosinophils Absolute 0.2  0.0 - 0.7 K/uL   Basophils Relative 0  0 - 1 %   Basophils Absolute 0.0  0.0 - 0.1 K/uL  COMPREHENSIVE METABOLIC PANEL     Status: Abnormal   Collection Time    03/02/13 10:50 AM      Result Value Range   Sodium 139  137 - 147 mEq/L   Potassium 4.2  3.7 - 5.3 mEq/L   Chloride 100  96 - 112 mEq/L   CO2 24  19 - 32 mEq/L   Glucose, Bld 131 (*) 70 - 99 mg/dL   BUN 17  6 - 23 mg/dL   Creatinine, Ser 0.68  0.50 - 1.10 mg/dL   Calcium 9.3  8.4 - 10.5 mg/dL   Total Protein 7.5  6.0 - 8.3 g/dL   Albumin 3.9  3.5 - 5.2 g/dL   AST 19  0 - 37 U/L   ALT 15  0 - 35 U/L   Alkaline Phosphatase 72  39 - 117 U/L   Total Bilirubin 0.8  0.3 - 1.2 mg/dL   GFR calc non Af Amer 83 (*) >90 mL/min   GFR calc  Af Amer >90  >90 mL/min   Comment: (NOTE)     The eGFR has been calculated using the CKD EPI equation.     This calculation has not been validated in all clinical situations.     eGFR's persistently <90 mL/min signify possible Chronic Kidney     Disease.  LIPASE, BLOOD     Status: None   Collection Time    03/02/13 10:50 AM       Result Value Range   Lipase 19  11 - 59 U/L  LACTIC ACID, PLASMA     Status: None   Collection Time    03/02/13 11:00 AM      Result Value Range   Lactic Acid, Venous 2.1  0.5 - 2.2 mmol/L  BASIC METABOLIC PANEL     Status: Abnormal   Collection Time    03/03/13  4:50 AM      Result Value Range   Sodium 142  137 - 147 mEq/L   Potassium 3.6 (*) 3.7 - 5.3 mEq/L   Chloride 105  96 - 112 mEq/L   CO2 26  19 - 32 mEq/L   Glucose, Bld 84  70 - 99 mg/dL   BUN 11  6 - 23 mg/dL   Creatinine, Ser 0.60  0.50 - 1.10 mg/dL   Calcium 8.4  8.4 - 10.5 mg/dL   GFR calc non Af Amer 87 (*) >90 mL/min   GFR calc Af Amer >90  >90 mL/min   Comment: (NOTE)     The eGFR has been calculated using the CKD EPI equation.     This calculation has not been validated in all clinical situations.     eGFR's persistently <90 mL/min signify possible Chronic Kidney     Disease.  CBC     Status: Abnormal   Collection Time    03/03/13  4:50 AM      Result Value Range   WBC 4.4  4.0 - 10.5 K/uL   RBC 3.63 (*) 3.87 - 5.11 MIL/uL   Hemoglobin 11.6 (*) 12.0 - 15.0 g/dL   Comment: DELTA CHECK NOTED   HCT 35.0 (*) 36.0 - 46.0 %   MCV 96.4  78.0 - 100.0 fL   MCH 32.0  26.0 - 34.0 pg   MCHC 33.1  30.0 - 36.0 g/dL   RDW 15.2  11.5 - 15.5 %   Platelets 148 (*) 150 - 400 K/uL    Ct Abdomen Pelvis W Contrast  03/02/2013   CLINICAL DATA:  Abdominal pain, history hypertension  EXAM: CT ABDOMEN AND PELVIS WITH CONTRAST  TECHNIQUE: Multidetector CT imaging of the abdomen and pelvis was performed using the standard protocol following bolus administration of intravenous contrast. Sagittal and coronal MPR images reconstructed from axial data set.  CONTRAST:  183m OMNIPAQUE IOHEXOL 300 MG/ML SOLN ORALLY, 532mOMNIPAQUE IOHEXOL 300 MG/ML SOLN IV  COMPARISON:  CT ABD - PELV W/ CM dated 04/11/2011  FINDINGS: Minimal dependent atelectasis at lung bases.  Liver, spleen, pancreas, kidneys, and adrenal glands normal appearance.   Appendix surgically absent by history.  Beam hardening artifacts from bilateral hip prostheses obscure portions of pelvis.  Dilated proximal and decompressed distal small bowel loops compatible with small bowel obstruction.  Transition from dilated to nondilated small bowel occurs in the mid small bowel in the right mid abdomen.  No bowel wall thickening or free intraperitoneal air.  Colon and stomach unremarkable.  Visualized portion of uterus, bladder and ureters normal appearance.  No mass, adenopathy, or hernia.  Osseous demineralization.  IMPRESSION: Mid small bowel obstruction with transition zone in the right mid abdomen   Electronically Signed   By: Lavonia Dana M.D.   On: 03/02/2013 13:15    ROS Blood pressure 112/57, pulse 52, temperature 97.8 F (36.6 C), temperature source Oral, resp. rate 20, height 5' 8"  (1.727 m), weight 176 lb (79.833 kg), SpO2 98.00%. Physical Exam Alert and oriented. Skin warm and dry. Oral mucosa is moist.   . Sclera anicteric, conjunctivae is pink. Thyroid not enlarged. No cervical lymphadenopathy. Lungs clear. Heart regular rate and rhythm.  Abdomen is soft. Bowel sounds are positive. No hepatomegaly. No abdominal masses felt. No tenderness.  No edema to lower extremities.     Assessment/Plan: Chronic left lower abdominal pain. Recently treated empirically for diverticulitis.  Recent CT revealed SBO.  KUB today. I discussed with Dr Rosalin Hawking 03/04/2013, 9:51 AM     GI attending note; Patient interviewed and examined. Abdominopelvic CT reviewed with Dr. Thornton Papas earlier today; plain abdominal film from this morning also reviewed. Patient well known to me from previous evaluation for chronic throbbing abdominal pain. Now she presents with small bowel obstruction most likely due to the adhesive disease and she has responded to conservative therapy. Dr. Arnoldo Morale has evaluated the patient and felt no indication for surgery at this time. Patient is  having liquid stools and abdominal exam revealed soft abdomen with mild tenderness in left lower quadrant which is chronic. If she has future episodes small bowel obstruction she will need surgery. Patient advised to return to emergency room should she have similar symptomatology in the future. Discussed with Dr. Luan Pulling.

## 2013-03-04 NOTE — Progress Notes (Signed)
  Subjective: Lightheaded this morning, but no emesis. Left-sided abdominal pain unchanged. Continues to pass flatus and have bowel movements.  Objective: Vital signs in last 24 hours: Temp:  [97.6 F (36.4 C)-97.8 F (36.6 C)] 97.8 F (36.6 C) (02/06 0425) Pulse Rate:  [44-52] 44 (02/06 1143) Resp:  [18-20] 20 (02/06 0425) BP: (112-125)/(57-66) 116/66 mmHg (02/06 1143) SpO2:  [95 %-98 %] 98 % (02/06 0425) Last BM Date: 03/03/13  Intake/Output from previous day: 02/05 0701 - 02/06 0700 In: 2886.3 [P.O.:240; I.V.:2646.3] Out: -  Intake/Output this shift: Total I/O In: 120 [P.O.:120] Out: -   GI: Soft, nondistended. Nonspecific tenderness noted along the left side the abdomen. No rigidity noted. Unchanged from yesterday.  Lab Results:   Recent Labs  03/02/13 1050 03/03/13 0450  WBC 9.0 4.4  HGB 14.3 11.6*  HCT 41.2 35.0*  PLT 178 148*   BMET  Recent Labs  03/02/13 1050 03/03/13 0450  NA 139 142  K 4.2 3.6*  CL 100 105  CO2 24 26  GLUCOSE 131* 84  BUN 17 11  CREATININE 0.68 0.60  CALCIUM 9.3 8.4   PT/INR No results found for this basename: LABPROT, INR,  in the last 72 hours  Studies/Results: Dg Abd 1 View  03/04/2013   CLINICAL DATA:  Small bowel obstruction  EXAM: ABDOMEN - 1 VIEW  COMPARISON:  CT abdomen and pelvis 03/02/2013  FINDINGS: Decreased small bowel distention since previous exam.  Retained contrast in distal colon.  No definite bowel wall thickening.  Bones diffusely demineralized with bilateral hip prostheses.  No urinary tract calcification.  IMPRESSION: Decreased small bowel distention.   Electronically Signed   By: Lavonia Dana M.D.   On: 03/04/2013 11:39   Ct Abdomen Pelvis W Contrast  03/02/2013   CLINICAL DATA:  Abdominal pain, history hypertension  EXAM: CT ABDOMEN AND PELVIS WITH CONTRAST  TECHNIQUE: Multidetector CT imaging of the abdomen and pelvis was performed using the standard protocol following bolus administration of intravenous  contrast. Sagittal and coronal MPR images reconstructed from axial data set.  CONTRAST:  172mL OMNIPAQUE IOHEXOL 300 MG/ML SOLN ORALLY, 45mL OMNIPAQUE IOHEXOL 300 MG/ML SOLN IV  COMPARISON:  CT ABD - PELV W/ CM dated 04/11/2011  FINDINGS: Minimal dependent atelectasis at lung bases.  Liver, spleen, pancreas, kidneys, and adrenal glands normal appearance.  Appendix surgically absent by history.  Beam hardening artifacts from bilateral hip prostheses obscure portions of pelvis.  Dilated proximal and decompressed distal small bowel loops compatible with small bowel obstruction.  Transition from dilated to nondilated small bowel occurs in the mid small bowel in the right mid abdomen.  No bowel wall thickening or free intraperitoneal air.  Colon and stomach unremarkable.  Visualized portion of uterus, bladder and ureters normal appearance.  No mass, adenopathy, or hernia.  Osseous demineralization.  IMPRESSION: Mid small bowel obstruction with transition zone in the right mid abdomen   Electronically Signed   By: Lavonia Dana M.D.   On: 03/02/2013 13:15    Anti-infectives: Anti-infectives   None      Assessment/Plan: Impression: Partial small bowel obstruction, resolved Plan: No need for acute surgical intervention at this time. We'll advance diet as tolerated. Will continue to follow with you.  LOS: 2 days    Zoe [name] A 03/04/2013

## 2013-03-04 NOTE — Progress Notes (Signed)
Advanced the patients diet to a regular diet.  She does not complain of nausea at this time only abd tenderness.  We will assess how she does and change diet as needed.

## 2013-03-05 NOTE — Discharge Summary (Signed)
Physician Discharge Summary  Patient ID: AUBURN HESTER MRN: [id number] DOB/AGE: [birth year] 78 y.o. Primary Care Physician:Samaria Anes L, MD Admit date: 03/02/2013 Discharge date: 03/05/2013    Discharge Diagnoses:   Active Problems:   DEGEN LUMBAR/LUMBOSACRAL INTERVERTEBRAL DISC   SPINAL STENOSIS   Chronic constipation   GERD (gastroesophageal reflux disease)   OA (osteoarthritis)   Arthritis pain of hip   Degenerative arthritis of hip, left   Essential hypertension, benign   Depression   Asthma   Fibromyalgia   SBO (small bowel obstruction)     Medication List         albuterol 108 (90 BASE) MCG/ACT inhaler  Commonly known as:  PROVENTIL HFA;VENTOLIN HFA  Inhale 2 puffs into the lungs every 6 (six) hours as needed for wheezing or shortness of breath.     allopurinol 300 MG tablet  Commonly known as:  ZYLOPRIM  Take 300 mg by mouth daily.     ALPRAZolam 0.5 MG tablet  Commonly known as:  XANAX  Take 0.5 mg by mouth 3 (three) times daily as needed for anxiety. Pt takes 3-4 tablets weekly     amLODipine 2.5 MG tablet  Commonly known as:  NORVASC  Take 2.5 mg by mouth every morning.     aspirin EC 81 MG tablet  Take 81 mg by mouth daily.     BIOTIN 5000 5 MG Caps  Generic drug:  Biotin  Take 1 capsule by mouth every morning.     digoxin 0.25 MG tablet  Commonly known as:  LANOXIN  Take 250 mcg by mouth every morning.     doxepin 100 MG capsule  Commonly known as:  SINEQUAN  Take 100 mg by mouth at bedtime.     fexofenadine 60 MG tablet  Commonly known as:  ALLEGRA  Take 60 mg by mouth daily. Pt takes 1/2 tablet daily     gabapentin 300 MG capsule  Commonly known as:  NEURONTIN  Take 300 mg by mouth 2 (two) times daily.     multivitamin with minerals Tabs tablet  Take 1 tablet by mouth daily.     naproxen 500 MG tablet  Commonly known as:  NAPROSYN  Take 250 mg by mouth 2 (two) times daily with a meal.     omeprazole 20 MG capsule  Commonly  known as:  PRILOSEC  Take 20 mg by mouth daily.     pravastatin 20 MG tablet  Commonly known as:  PRAVACHOL  Take 20 mg by mouth every morning. Pt takes it every other day     psyllium 58.6 % powder  Commonly known as:  METAMUCIL  Take 1 packet by mouth daily.     quiNINE 324 MG capsule  Commonly known as:  QUALAQUIN  Take 324 mg by mouth at bedtime.     senna 8.6 MG tablet  Commonly known as:  SENOKOT  Take 5 tablets by mouth 2 (two) times daily.     zolpidem 10 MG tablet  Commonly known as:  AMBIEN  Take 10 mg by mouth at bedtime as needed for sleep.        Discharged Condition: Improved    Consults: General surgery/gastroenterology  Significant Diagnostic Studies: Dg Abd 1 View  03/04/2013   CLINICAL DATA:  Small bowel obstruction  EXAM: ABDOMEN - 1 VIEW  COMPARISON:  CT abdomen and pelvis 03/02/2013  FINDINGS: Decreased small bowel distention since previous exam.  Retained contrast in distal colon.  No definite bowel  wall thickening.  Bones diffusely demineralized with bilateral hip prostheses.  No urinary tract calcification.  IMPRESSION: Decreased small bowel distention.   Electronically Signed   By: Lavonia Dana M.D.   On: 03/04/2013 11:39   Ct Abdomen Pelvis W Contrast  03/02/2013   CLINICAL DATA:  Abdominal pain, history hypertension  EXAM: CT ABDOMEN AND PELVIS WITH CONTRAST  TECHNIQUE: Multidetector CT imaging of the abdomen and pelvis was performed using the standard protocol following bolus administration of intravenous contrast. Sagittal and coronal MPR images reconstructed from axial data set.  CONTRAST:  138mL OMNIPAQUE IOHEXOL 300 MG/ML SOLN ORALLY, 63mL OMNIPAQUE IOHEXOL 300 MG/ML SOLN IV  COMPARISON:  CT ABD - PELV W/ CM dated 04/11/2011  FINDINGS: Minimal dependent atelectasis at lung bases.  Liver, spleen, pancreas, kidneys, and adrenal glands normal appearance.  Appendix surgically absent by history.  Beam hardening artifacts from bilateral hip prostheses  obscure portions of pelvis.  Dilated proximal and decompressed distal small bowel loops compatible with small bowel obstruction.  Transition from dilated to nondilated small bowel occurs in the mid small bowel in the right mid abdomen.  No bowel wall thickening or free intraperitoneal air.  Colon and stomach unremarkable.  Visualized portion of uterus, bladder and ureters normal appearance.  No mass, adenopathy, or hernia.  Osseous demineralization.  IMPRESSION: Mid small bowel obstruction with transition zone in the right mid abdomen   Electronically Signed   By: Lavonia Dana M.D.   On: 03/02/2013 13:15    Lab Results: Basic Metabolic Panel:  Recent Labs  03/02/13 1050 03/03/13 0450  NA 139 142  K 4.2 3.6*  CL 100 105  CO2 24 26  GLUCOSE 131* 84  BUN 17 11  CREATININE 0.68 0.60  CALCIUM 9.3 8.4   Liver Function Tests:  Recent Labs  03/02/13 1050  AST 19  ALT 15  ALKPHOS 72  BILITOT 0.8  PROT 7.5  ALBUMIN 3.9     CBC:  Recent Labs  03/02/13 1050 03/03/13 0450  WBC 9.0 4.4  NEUTROABS 6.8  --   HGB 14.3 11.6*  HCT 41.2 35.0*  MCV 94.5 96.4  PLT 178 148*    No results found for this or any previous visit (from the past 240 hour(s)).   Hospital Course: She was admitted with what appeared to be a small bowel obstruction. This resolved spontaneously. She developed some diarrhea. She's had abdominal pain for several years and it is felt to be perhaps related to this small bowel obstruction and perhaps a chronic partial small bowel obstruction. It was felt that she did not need surgery emergently but that will be reevaluated as an outpatient. GI consultation was obtained and it was not felt that she needed any inpatient studies. She was back at baseline at the time of discharge  Discharge Exam: Blood pressure 116/66, pulse 44, temperature 97.8 F (36.6 C), temperature source Oral, resp. rate 20, height 5\' 8"  (1.727 m), weight 79.833 kg (176 lb), SpO2 98.00%. She is awake  and alert. Her abdomen is minimally tender to the left of the umbilicus which is a chronic finding. Bowel sounds are present and active her chest is clear.  Disposition: Home      Discharge Orders   Future Appointments Provider Department Dept Phone   05/17/2013 11:30 AM Rogene Houston, MD [facility] [phone]   Future Orders Complete By Expires   Discharge patient  As directed  Signed: Krishiv Sandler L   03/05/2013, 8:58 AM

## 2013-03-16 ENCOUNTER — Other Ambulatory Visit (INDEPENDENT_AMBULATORY_CARE_PROVIDER_SITE_OTHER): Payer: Self-pay | Admitting: Internal Medicine

## 2013-03-16 ENCOUNTER — Telehealth (INDEPENDENT_AMBULATORY_CARE_PROVIDER_SITE_OTHER): Payer: Self-pay | Admitting: *Deleted

## 2013-03-16 ENCOUNTER — Ambulatory Visit (HOSPITAL_COMMUNITY)
Admission: RE | Admit: 2013-03-16 | Discharge: 2013-03-16 | Disposition: A | Discharge status: Home / Self Care | Payer: Medicare Other | Source: Ambulatory Visit | Attending: Internal Medicine | Admitting: Internal Medicine

## 2013-03-16 DIAGNOSIS — R109 Unspecified abdominal pain: Secondary | ICD-10-CM | POA: Insufficient documentation

## 2013-03-16 LAB — HEPATIC FUNCTION PANEL
ALT: 18 U/L (ref 0–35)
AST: 20 U/L (ref 0–37)
Albumin: 4 g/dL (ref 3.5–5.2)
Alkaline Phosphatase: 80 U/L (ref 39–117)
Bilirubin, Direct: 0.1 mg/dL (ref 0.0–0.3)
Indirect Bilirubin: 0.7 mg/dL (ref 0.2–1.2)
Total Bilirubin: 0.8 mg/dL (ref 0.3–1.2)
Total Protein: 7.7 g/dL (ref 6.0–8.3)

## 2013-03-16 MED ORDER — PROMETHAZINE HCL 25 MG PO TABS: 25.0000 mg | ORAL_TABLET | Freq: Two times a day (BID) | ORAL | Status: DC | PRN

## 2013-03-16 NOTE — Telephone Encounter (Signed)
Patient is having terrible abdominal pain, per NUR the patient will ned to have LFT's and abdominal Series, Flat and Upright. Both the lab and X-Ray ask to call Dr.Rehman with results.

## 2013-03-16 NOTE — Telephone Encounter (Signed)
Order placed for KUB, no appt needed

## 2013-04-21 ENCOUNTER — Ambulatory Visit (INDEPENDENT_AMBULATORY_CARE_PROVIDER_SITE_OTHER): Payer: Medicare Other | Admitting: Otolaryngology

## 2013-04-21 DIAGNOSIS — H903 Sensorineural hearing loss, bilateral: Secondary | ICD-10-CM

## 2013-04-21 DIAGNOSIS — J310 Chronic rhinitis: Secondary | ICD-10-CM

## 2013-04-21 DIAGNOSIS — H8110 Benign paroxysmal vertigo, unspecified ear: Secondary | ICD-10-CM

## 2013-05-17 ENCOUNTER — Encounter (INDEPENDENT_AMBULATORY_CARE_PROVIDER_SITE_OTHER): Payer: Self-pay | Admitting: Internal Medicine

## 2013-05-17 ENCOUNTER — Ambulatory Visit (INDEPENDENT_AMBULATORY_CARE_PROVIDER_SITE_OTHER): Payer: Medicare Other | Admitting: Internal Medicine

## 2013-05-17 VITALS — BP 118/70 | HR 82 | Temp 96.7°F | Resp 18 | Ht 67.5 in | Wt 174.2 lb

## 2013-05-17 DIAGNOSIS — K5900 Constipation, unspecified: Secondary | ICD-10-CM

## 2013-05-17 DIAGNOSIS — R1032 Left lower quadrant pain: Secondary | ICD-10-CM

## 2013-05-17 DIAGNOSIS — K219 Gastro-esophageal reflux disease without esophagitis: Secondary | ICD-10-CM

## 2013-05-17 DIAGNOSIS — K5909 Other constipation: Secondary | ICD-10-CM

## 2013-05-17 MED ORDER — PANTOPRAZOLE SODIUM 40 MG PO TBEC: 40.0000 mg | DELAYED_RELEASE_TABLET | Freq: Every day | ORAL | Status: DC

## 2013-05-17 NOTE — Patient Instructions (Signed)
Call if abdominal pain gets worse

## 2013-05-17 NOTE — Progress Notes (Signed)
Presenting complaint;  Followup for abdominal pain, GERD and history of small bowel obstruction.  Subjective:  [name] who presents for scheduled visit. She was hospitalized in the first week of February this year for small bowel obstruction and responded to medical therapy. She states acute symptoms have not recurred but she continues to complain of throbbing pain on the left side of her abdomen below and to the left of umbilicus. She has had this pain for years if not months. She says today she has noted minimal discomfort. She says omeprazole is not working anymore. She is trying to be more fiber and she states she has dropped Senokot dose from 10 tablets a day to 6(3 twice daily). She denies nausea vomiting dysphagia melena or rectal bleeding. She has developed back pain radiating to her right leg. This pain started when she was lifting boxes at home. She has seen by her back specialist and is to begin Medrol Dosepak and muscle relaxant.   Current Medications: Outpatient Encounter Prescriptions as of 05/17/2013  Medication Sig  . albuterol (PROVENTIL HFA;VENTOLIN HFA) 108 (90 BASE) MCG/ACT inhaler Inhale 2 puffs into the lungs every 6 (six) hours as needed for wheezing or shortness of breath.  . ALPRAZolam (XANAX) 0.5 MG tablet Take 0.5 mg by mouth 3 (three) times daily as needed for anxiety. Pt takes 3-4 tablets weekly  . amLODipine (NORVASC) 2.5 MG tablet Take 2.5 mg by mouth every morning.   Marland Kitchen aspirin EC 81 MG tablet Take 81 mg by mouth daily.  . Cetirizine HCl (ZYRTEC PO) Take 10 mg by mouth.  . digoxin (LANOXIN) 0.25 MG tablet Take 0.125 mcg by mouth every morning.   Marland Kitchen doxepin (SINEQUAN) 100 MG capsule Take 100 mg by mouth at bedtime.   . gabapentin (NEURONTIN) 300 MG capsule Take 300 mg by mouth daily.   . Multiple Vitamin (MULITIVITAMIN WITH MINERALS) TABS Take 1 tablet by mouth daily.  . naproxen (NAPROSYN) 500 MG tablet Take 250 mg by mouth 2 (two)  times daily with a meal.  . omeprazole (PRILOSEC) 20 MG capsule Take 20 mg by mouth daily.  Marland Kitchen PATANOL 0.1 % ophthalmic solution Place 1 drop into both eyes as needed.   . pravastatin (PRAVACHOL) 20 MG tablet Take 20 mg by mouth every morning. Pt takes it every other day  . psyllium (METAMUCIL) 58.6 % powder Take 1 packet by mouth daily.   . quiNINE (QUALAQUIN) 324 MG capsule Take 150 mg by mouth at bedtime.   . senna (SENOKOT) 8.6 MG tablet Take by mouth. Patient states that she is taking 3 in the morning and 3 in the evening.  . zolpidem (AMBIEN) 10 MG tablet Take 10 mg by mouth at bedtime as needed for sleep.  . [DISCONTINUED] allopurinol (ZYLOPRIM) 300 MG tablet Take 300 mg by mouth daily.  . [DISCONTINUED] Biotin (BIOTIN 5000) 5 MG CAPS Take 1 capsule by mouth every morning.  . [DISCONTINUED] fexofenadine (ALLEGRA) 60 MG tablet Take 60 mg by mouth daily. Pt takes 1/2 tablet daily  . [DISCONTINUED] promethazine (PHENERGAN) 25 MG tablet Take 1 tablet (25 mg total) by mouth 2 (two) times daily as needed for nausea or vomiting.     Objective: Blood pressure 118/70, pulse 82, temperature 96.7 F (35.9 C), temperature source Oral, resp. rate 18, height 5' 7.5" (1.715 m), weight 174 lb 3.2 oz (79.017 kg).  patient is alert and appears to be in some pain on moving from chair to examination  table. Conjunctiva is pink. Sclera is nonicteric Oropharyngeal mucosa is normal. No neck masses or thyromegaly noted. Cardiac exam with regular rhythm normal S1 and S2. No murmur or gallop noted. Lungs are clear to auscultation. Abdomen. Bowel sounds are normal. Abdomen is symmetrical and very soft. She has mild tenderness below and to the left of umbilicus but no mass noted. No organomegaly either  No LE edema or clubbing noted.   Assessment:  #1. chronic left-sided abdominal pain possibly due to constipation predominant IBS. #2. 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 She has been able to cut  back on dose of Senokot from 10 tablets a day to 6. #3. GERD. Heartburn not well controlled with omeprazole. #4. History of small bowel obstruction responding to medical therapy. If SBO recovers she will need surgical intervention.   Plan:  Discontinue omeprazole. Begin pantoprazole 40 mg by mouth q. A.m. Call if abdominal pain gets worse. Office visit in 6 months.

## 2013-07-14 ENCOUNTER — Telehealth (INDEPENDENT_AMBULATORY_CARE_PROVIDER_SITE_OTHER): Payer: Self-pay | Admitting: *Deleted

## 2013-07-14 NOTE — Telephone Encounter (Signed)
I talked with the patient and she states that this all started on Monday the 15 th. Nausea , Throwing up , Diarrhea. She did experience this on Tuesday also,along with Back Pain. She started taking Amitiza and the Carafate as she had been instructed before. This morning she had a small BM. She states that she feel like she did when she had a bowel blockage and was hospitalized.

## 2013-07-14 NOTE — Telephone Encounter (Signed)
Forwarded to [facility] to arrange. Patient called and given Dr.Rehman's recommendation.

## 2013-07-14 NOTE — Telephone Encounter (Signed)
Will do acute abdominal series; Since she is having diarrhea I doubt that she has small bowel obstruction

## 2013-07-14 NOTE — Telephone Encounter (Signed)
Zoe [name] has been having diarrhea with vomiting since Monday, 07/11/13. She is also having abd pain thru to the back area. Would like for Dr. Laural Golden to please return her call at [phone]. Zoe [name] had Maitiza and Carafate left over that she started to see if they would help.

## 2013-07-15 ENCOUNTER — Ambulatory Visit (HOSPITAL_COMMUNITY)
Admission: RE | Admit: 2013-07-15 | Discharge: 2013-07-15 | Disposition: A | Discharge status: Home / Self Care | Payer: Medicare Other | Source: Ambulatory Visit | Attending: Internal Medicine | Admitting: Internal Medicine

## 2013-07-15 ENCOUNTER — Other Ambulatory Visit (INDEPENDENT_AMBULATORY_CARE_PROVIDER_SITE_OTHER): Payer: Self-pay | Admitting: Internal Medicine

## 2013-07-15 DIAGNOSIS — Z8719 Personal history of other diseases of the digestive system: Secondary | ICD-10-CM

## 2013-07-15 DIAGNOSIS — M545 Low back pain, unspecified: Secondary | ICD-10-CM | POA: Insufficient documentation

## 2013-07-15 DIAGNOSIS — R143 Flatulence: Secondary | ICD-10-CM

## 2013-07-15 DIAGNOSIS — R110 Nausea: Secondary | ICD-10-CM

## 2013-07-15 DIAGNOSIS — R142 Eructation: Secondary | ICD-10-CM

## 2013-07-15 DIAGNOSIS — R197 Diarrhea, unspecified: Secondary | ICD-10-CM | POA: Insufficient documentation

## 2013-07-15 DIAGNOSIS — R112 Nausea with vomiting, unspecified: Secondary | ICD-10-CM | POA: Insufficient documentation

## 2013-07-15 DIAGNOSIS — R109 Unspecified abdominal pain: Secondary | ICD-10-CM

## 2013-07-15 DIAGNOSIS — R141 Gas pain: Secondary | ICD-10-CM | POA: Insufficient documentation

## 2013-07-15 NOTE — Telephone Encounter (Signed)
Order placed in EPIC, patient can go any time, no appoint needed, she is aware

## 2013-07-20 ENCOUNTER — Ambulatory Visit (HOSPITAL_COMMUNITY)
Admission: RE | Admit: 2013-07-20 | Discharge: 2013-07-20 | Disposition: A | Discharge status: Home / Self Care | Payer: Medicare Other | Source: Ambulatory Visit | Attending: Internal Medicine | Admitting: Internal Medicine

## 2013-07-20 DIAGNOSIS — R1032 Left lower quadrant pain: Secondary | ICD-10-CM

## 2013-07-20 DIAGNOSIS — M51379 Other intervertebral disc degeneration, lumbosacral region without mention of lumbar back pain or lower extremity pain: Secondary | ICD-10-CM | POA: Insufficient documentation

## 2013-07-20 DIAGNOSIS — M5137 Other intervertebral disc degeneration, lumbosacral region: Secondary | ICD-10-CM | POA: Insufficient documentation

## 2013-07-20 DIAGNOSIS — I10 Essential (primary) hypertension: Secondary | ICD-10-CM | POA: Insufficient documentation

## 2013-07-20 DIAGNOSIS — R110 Nausea: Secondary | ICD-10-CM | POA: Insufficient documentation

## 2013-07-20 DIAGNOSIS — R933 Abnormal findings on diagnostic imaging of other parts of digestive tract: Secondary | ICD-10-CM | POA: Insufficient documentation

## 2013-07-20 DIAGNOSIS — R109 Unspecified abdominal pain: Secondary | ICD-10-CM | POA: Insufficient documentation

## 2013-07-20 LAB — POCT I-STAT CREATININE: Creatinine, Ser: 0.9 mg/dL (ref 0.50–1.10)

## 2013-07-20 MED ORDER — IOHEXOL 300 MG/ML  SOLN
100.0000 mL | Freq: Once | INTRAMUSCULAR | Status: AC | PRN
  Administered 2013-07-20: 100 mL via INTRAVENOUS

## 2013-07-20 MED ORDER — SODIUM CHLORIDE 0.9 % IJ SOLN
INTRAMUSCULAR | Status: AC
  Filled 2013-07-20: qty 250

## 2013-07-21 ENCOUNTER — Ambulatory Visit (INDEPENDENT_AMBULATORY_CARE_PROVIDER_SITE_OTHER): Payer: Medicare Other | Admitting: Internal Medicine

## 2013-07-21 ENCOUNTER — Encounter (INDEPENDENT_AMBULATORY_CARE_PROVIDER_SITE_OTHER): Payer: Self-pay | Admitting: Internal Medicine

## 2013-07-21 VITALS — BP 118/84 | HR 68 | Temp 97.0°F | Resp 18 | Ht 67.5 in | Wt 174.3 lb

## 2013-07-21 DIAGNOSIS — R1032 Left lower quadrant pain: Secondary | ICD-10-CM

## 2013-07-21 NOTE — Patient Instructions (Signed)
Report to ER if you another episode of abdominal pain, nausea and vomiting.

## 2013-07-21 NOTE — Progress Notes (Signed)
Presenting complaint;  Recent bout with nausea vomiting diarrhea and abdominal pain.  Subjective:  [name] who has chronic constipation, chronic abdominal pain and history of small bowel obstruction for which she was hospitalized in February this year and responded to medical therapy. She was last seen on 05/17/2013 and appears to be doing well other than LLQ abdominal pain. She will call 6 on the morning of 07/11/2013. She had nausea vomiting and diarrhea. She had multiple episodes of vomiting she did not have hematemesis. She also noted worsening abdominal pain which was generalized. The symptoms continued for 2 days. Vomiting resolved but the other symptoms are gone. She called the office and plan abdominal films obtained which revealed dilated loop of bowel in right side of the abdomen possibly colon. Followup CT was obtained and now she is here for a visit. She says acute symptoms have resolved. She remains that throbbing daily pain in left lower quadrant of her abdomen. She had some nausea this morning but it has resolved. She had normal bowel movement this morning. She believes she took dose of Linzess yesterday and this morning not sure. She is taking GlycoLax daily and has decreased Synacort dose from 10 tablets a day to 6 a day. She did not experience fever during acute illness. She denies rectal bleeding or melena. She is on Naprosyn 500 mg daily but she takes half a pill in the morning and half in the evening.         Current Medications: Outpatient Encounter Prescriptions as of 07/21/2013  Medication Sig  . albuterol (PROVENTIL HFA;VENTOLIN HFA) 108 (90 BASE) MCG/ACT inhaler Inhale 2 puffs into the lungs every 6 (six) hours as needed for wheezing or shortness of breath.  . ALPRAZolam (XANAX) 0.5 MG tablet Take 0.5 mg by mouth 3 (three) times daily as needed for anxiety. Pt takes 3-4 tablets weekly  . amLODipine (NORVASC) 2.5 MG tablet Take 2.5 mg  by mouth every morning.   Marland Kitchen aspirin EC 81 MG tablet Take 81 mg by mouth daily.  . Cetirizine HCl (ZYRTEC PO) Take 10 mg by mouth as needed (patient states that she takes 1- 2 times a week).   Marland Kitchen digoxin (LANOXIN) 0.25 MG tablet Take 0.125 mcg by mouth every morning.   Marland Kitchen doxepin (SINEQUAN) 100 MG capsule Take 100 mg by mouth at bedtime.   . gabapentin (NEURONTIN) 300 MG capsule Take 300 mg by mouth daily.   . Multiple Vitamin (MULITIVITAMIN WITH MINERALS) TABS Take 1 tablet by mouth daily.  . naproxen (NAPROSYN) 500 MG tablet Take 500 mg by mouth daily.   . pantoprazole (PROTONIX) 40 MG tablet Take 1 tablet (40 mg total) by mouth daily.  . pravastatin (PRAVACHOL) 20 MG tablet Take 20 mg by mouth every morning. Pt takes it every other day  . psyllium (METAMUCIL) 58.6 % powder Take 1 packet by mouth daily.   . quiNINE (QUALAQUIN) 324 MG capsule Take 150 mg by mouth at bedtime. Patient states that she takes 150 mg by mouth one night , then the next night she takes 300 mg by mouth. Alternates.  . senna (SENOKOT) 8.6 MG tablet Take by mouth. Patient states that she is taking 3 in the morning and 3 in the evening.  . zolpidem (AMBIEN) 10 MG tablet Take 5 mg by mouth at bedtime as needed for sleep (Patient states that she takes 3- 5 mg at bedtime nightly).   . [DISCONTINUED] PATANOL 0.1 % ophthalmic solution Place  1 drop into both eyes as needed.      Objective: Blood pressure 118/84, pulse 68, temperature 97 F (36.1 C), temperature source Oral, resp. rate 18, height 5' 7.5" (1.715 m), weight 174 lb 4.8 oz (79.062 kg). Patient is alert and in NAD. Conjunctiva is pink. Sclera is nonicteric Oropharyngeal mucosa is normal. No neck masses or thyromegaly noted. Cardiac exam with regular rhythm normal S1 and S2. No murmur or gallop noted. Lungs are clear to auscultation. Abdomen;  No LE edema or clubbing noted.  Labs/studies Results:  Acute abdominal series from 07/15/2013 and abdominopelvic CT  from 07/20/2013 reviewed with patient and her husband. CT shows stool throughout the colon, atherosclerotic changes to the aorta and lumbar spondylosis and DJD.   Assessment:  #1. Recent bout with nausea vomiting abdominal pain and diarrhea most likely foodborne illness. She also could have had partial small bowel obstruction but less likely. Acute symptoms have resolved and therefore will not proceed with further workup. However if symptoms relapse she will need to be evaluated in the emergency room and make sure she does not have recurrent small bowel obstruction. #2. LLQ abdominal pain. She has chronic throbbing pain of unknown etiology. CT yesterday did not reveal any abnormality as to the cause of this pain. #3. GERD. Heartburn well controlled with therapy.    Plan:  Patient advised to go to ER if symptoms recur. Patient advised to take Naprosyn with food and then call the office if she should experience melena. Office visit in 6 months.

## 2013-07-22 ENCOUNTER — Telehealth (INDEPENDENT_AMBULATORY_CARE_PROVIDER_SITE_OTHER): Payer: Self-pay | Admitting: *Deleted

## 2013-07-22 NOTE — Telephone Encounter (Signed)
The two medicines she is on are: 1. [facility] brand called Natural Daily Acquanetta Belling takes 3/4 of a teaspoon-compared with Metamucil; 2. [facility] [employer] Glycio 3350 Power Force Solution-compared with [employer].

## 2013-07-25 NOTE — Telephone Encounter (Signed)
Phone call comes in Friday at 1:33 pm returning Donna's call after she had called in this morning.  Please call her back

## 2013-07-26 ENCOUNTER — Telehealth (INDEPENDENT_AMBULATORY_CARE_PROVIDER_SITE_OTHER): Payer: Self-pay | Admitting: *Deleted

## 2013-07-26 NOTE — Telephone Encounter (Signed)
Patient called and states that she has the two names of medications , that she did not know at the time of her office visit.  1- [facility] Brand - Natural Daily Fiber - Takes 3/4 teaspoon daily. 2- [facility] Laxative - compared to Miralax. Patient did not leave the amount or if she took daily or on an as needed basis. Will review with her at the time of her next office visit.

## 2013-07-26 NOTE — Telephone Encounter (Signed)
I have noted this on a telephone encounter.

## 2013-09-12 ENCOUNTER — Ambulatory Visit (INDEPENDENT_AMBULATORY_CARE_PROVIDER_SITE_OTHER): Payer: Medicare Other | Admitting: Surgery

## 2013-10-10 ENCOUNTER — Ambulatory Visit (INDEPENDENT_AMBULATORY_CARE_PROVIDER_SITE_OTHER): Payer: Medicare Other | Admitting: Surgery

## 2013-11-15 ENCOUNTER — Encounter (HOSPITAL_COMMUNITY): Payer: Self-pay | Admitting: Pharmacy Technician

## 2013-11-21 ENCOUNTER — Telehealth (INDEPENDENT_AMBULATORY_CARE_PROVIDER_SITE_OTHER): Payer: Self-pay | Admitting: *Deleted

## 2013-11-21 ENCOUNTER — Ambulatory Visit (INDEPENDENT_AMBULATORY_CARE_PROVIDER_SITE_OTHER): Payer: Medicare Other | Admitting: Internal Medicine

## 2013-11-21 ENCOUNTER — Encounter (INDEPENDENT_AMBULATORY_CARE_PROVIDER_SITE_OTHER): Payer: Self-pay | Admitting: Internal Medicine

## 2013-11-21 ENCOUNTER — Other Ambulatory Visit (INDEPENDENT_AMBULATORY_CARE_PROVIDER_SITE_OTHER): Payer: Self-pay | Admitting: *Deleted

## 2013-11-21 VITALS — BP 130/70 | HR 68 | Temp 97.0°F | Resp 18 | Ht 67.5 in | Wt 178.2 lb

## 2013-11-21 DIAGNOSIS — K5900 Constipation, unspecified: Secondary | ICD-10-CM

## 2013-11-21 DIAGNOSIS — K219 Gastro-esophageal reflux disease without esophagitis: Secondary | ICD-10-CM

## 2013-11-21 DIAGNOSIS — Z1211 Encounter for screening for malignant neoplasm of colon: Secondary | ICD-10-CM

## 2013-11-21 DIAGNOSIS — K5909 Other constipation: Secondary | ICD-10-CM

## 2013-11-21 NOTE — Progress Notes (Signed)
Presenting complaint;  Followup for abdominal pain and chronic constipation.  Subjective:  [name] who has lifelong history of constipation with dependence on laxatives was admitted to [facility] for signs and symptoms of small bowel obstruction and responded to conservative therapy. Patient was having intermittent pain and was therefore referred to Dr. Coralie Keens who recommended conservative therapy unless small bowel obstruction relapsed. Regarding her constipation he recommended evaluation by Dr. Leighton Ruff. She recommended colonoscopy and anorectal manometry to complete her evaluation for chronic constipation before further treatment options considered. Patient is scheduled for anorectal manometry at [facility] on 11/28/2013. Patient states she is feeling better even though she's been under a lot of stress due to her granddaughter's problem with drug abuse. She also went to [facility] to help her daughter recuperate following knee replacement. Lately she's been having bowel movement every other day and is not having abdominal pain. Her appetite is good. She denies rectal bleeding or melena. She states she's been taking Xanax twice daily and she believes it has helped with improved dislocation. She did try dropping Sennakot dose to 3 tablets twice daily but it did not work and she is back to taking 4 tablets twice daily. She says heartburn is well controlled with PPI and she denies dysphagia hoarseness or chronic cough.  She had EGD and colonoscopy 03/29/2003.  EGD revealed small sliding hiatal hernia and nonerosive antral gastritis. She underwent esophageal dilation to 56 French Maloney dilator because of dysphagia. 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   Current Medications: Outpatient Encounter Prescriptions as of 11/21/2013  Medication Sig  . albuterol (PROVENTIL HFA;VENTOLIN HFA) 108 (90 BASE) MCG/ACT inhaler Inhale 2 puffs into the  lungs every 6 (six) hours as needed for wheezing or shortness of breath.  Marland Kitchen albuterol (PROVENTIL) (2.5 MG/3ML) 0.083% nebulizer solution Take 2.5 mg by nebulization 2 (two) times daily as needed for wheezing or shortness of breath.  . ALPRAZolam (XANAX) 0.5 MG tablet Take 0.5 mg by mouth 3 (three) times daily as needed for anxiety.   Marland Kitchen amLODipine (NORVASC) 2.5 MG tablet Take 2.5 mg by mouth every other day. In the am  . aspirin EC 81 MG tablet Take 81 mg by mouth every morning.   . Biotin 1000 MCG tablet Take 1,000 mcg by mouth every morning.  . digoxin (LANOXIN) 0.25 MG tablet Take 0.25 mg by mouth every other day.   Marland Kitchen doxepin (SINEQUAN) 100 MG capsule Take 100 mg by mouth at bedtime.   . febuxostat (ULORIC) 40 MG tablet Take 80 mg by mouth at bedtime.   . fexofenadine (ALLEGRA) 60 MG tablet Take 30 mg by mouth every morning.  . gabapentin (NEURONTIN) 300 MG capsule Take 300 mg by mouth 2 (two) times daily.   . Multiple Vitamin (MULITIVITAMIN WITH MINERALS) TABS Take 1 tablet by mouth every morning.   . naproxen (NAPROSYN) 500 MG tablet Take 250 mg by mouth 2 (two) times daily.   Marland Kitchen omeprazole (PRILOSEC) 20 MG capsule Take 20 mg by mouth every morning.  . polyethylene glycol (MIRALAX / GLYCOLAX) packet Take 17 g by mouth daily.  . pravastatin (PRAVACHOL) 20 MG tablet Take 20 mg by mouth every other day.   . psyllium (METAMUCIL) 58.6 % powder Take 1 packet by mouth daily.   . quiNINE (QUALAQUIN) 324 MG capsule Take 150-324 mg by mouth at bedtime. Patient states that she takes 150 mg by mouth one night , then the next night she takes 300  mg by mouth. Alternates.  . senna (SENOKOT) 8.6 MG tablet Take 4 tablets by mouth 2 (two) times daily.   . [DISCONTINUED] OVER THE COUNTER MEDICATION Place 1-2 drops into both eyes daily as needed (dry/itchy eyes.). [facility] eye itch.  . [DISCONTINUED] predniSONE (STERAPRED UNI-PAK) 10 MG tablet Take 1-6 tablets by mouth daily. [id number]  . [DISCONTINUED]  psyllium (METAMUCIL) 58.6 % powder Take 1 packet by mouth 3 (three) times daily.     Objective: Blood pressure 130/70, pulse 68, temperature 97 F (36.1 C), temperature source Oral, resp. rate 18, height 5' 7.5" (1.715 m), weight 178 lb 3.2 oz (80.831 kg). Patient is alert and in no acute distress. Conjunctiva is pink. Sclera is nonicteric Oropharyngeal mucosa is normal. No neck masses or thyromegaly noted. Cardiac exam with regular rhythm normal S1 and S2. No murmur or gallop noted. Lungs are clear to auscultation. Abdomen is symmetrical. Bowels are normal. On palpation abdomen is soft and nontender without organomegaly or masses. No LE edema or clubbing noted.    Assessment:  #1. History of small bowel obstruction possibly secondary to adhesion. No recurrence since hospitalization in February this year. #2. Chronic constipation with dependence on laxatives. She is having daily bowel movements which is rather unusual. She can try decreasing Senokot dose gradually. She possibly has colonic inertia. Last colonoscopy was 10 years ago and she is due for one now. She has been evaluated by Dr. Leighton Ruff and is scheduled to undergo anorectal manometry next month. #3.GERD. Symptoms well-controlled with PPI.   Plan:  Patient will try decreasing Senokot dose from 8 tablets a day to 7 and a few weeks later to 6. Proceed with colonoscopy both for diagnostic and screening purpose.

## 2013-11-21 NOTE — Telephone Encounter (Signed)
Patient needs movi prep 

## 2013-11-21 NOTE — Patient Instructions (Signed)
Colonoscopy to be scheduled; Try decreasing sennakot dose by one pill a month

## 2013-11-22 MED ORDER — PEG-KCL-NACL-NASULF-NA ASC-C 100 G PO SOLR: 1.0000 | Freq: Once | ORAL | Status: DC

## 2013-11-25 ENCOUNTER — Encounter (HOSPITAL_COMMUNITY): Payer: Self-pay | Admitting: Pharmacy Technician

## 2013-11-28 ENCOUNTER — Ambulatory Visit (HOSPITAL_COMMUNITY)
Admission: RE | Admit: 2013-11-28 | Discharge: 2013-11-28 | Disposition: A | Discharge status: Home / Self Care | Payer: Medicare Other | Source: Ambulatory Visit | Attending: General Surgery | Admitting: General Surgery

## 2013-11-28 ENCOUNTER — Encounter (HOSPITAL_COMMUNITY)
Admission: RE | Disposition: A | Discharge status: Home / Self Care | Payer: Self-pay | Source: Ambulatory Visit | Attending: General Surgery

## 2013-11-28 DIAGNOSIS — K5900 Constipation, unspecified: Secondary | ICD-10-CM | POA: Insufficient documentation

## 2013-11-28 HISTORY — PX: ANAL RECTAL MANOMETRY: SHX6358

## 2013-11-28 SURGERY — MANOMETRY, ANORECTAL

## 2013-11-29 ENCOUNTER — Encounter (HOSPITAL_COMMUNITY): Payer: Self-pay | Admitting: General Surgery

## 2013-12-09 ENCOUNTER — Encounter (HOSPITAL_COMMUNITY)
Admission: RE | Disposition: A | Discharge status: Home / Self Care | Payer: Self-pay | Source: Ambulatory Visit | Attending: Internal Medicine

## 2013-12-09 ENCOUNTER — Encounter (HOSPITAL_COMMUNITY): Payer: Self-pay

## 2013-12-09 ENCOUNTER — Ambulatory Visit (HOSPITAL_COMMUNITY)
Admission: RE | Admit: 2013-12-09 | Discharge: 2013-12-09 | Disposition: A | Discharge status: Home / Self Care | Payer: Medicare Other | Source: Ambulatory Visit | Attending: Internal Medicine | Admitting: Internal Medicine

## 2013-12-09 DIAGNOSIS — Z79899 Other long term (current) drug therapy: Secondary | ICD-10-CM | POA: Diagnosis not present

## 2013-12-09 DIAGNOSIS — K644 Residual hemorrhoidal skin tags: Secondary | ICD-10-CM | POA: Insufficient documentation

## 2013-12-09 DIAGNOSIS — K649 Unspecified hemorrhoids: Secondary | ICD-10-CM

## 2013-12-09 DIAGNOSIS — I10 Essential (primary) hypertension: Secondary | ICD-10-CM | POA: Insufficient documentation

## 2013-12-09 DIAGNOSIS — Z85828 Personal history of other malignant neoplasm of skin: Secondary | ICD-10-CM | POA: Insufficient documentation

## 2013-12-09 DIAGNOSIS — D120 Benign neoplasm of cecum: Secondary | ICD-10-CM | POA: Diagnosis not present

## 2013-12-09 DIAGNOSIS — K5909 Other constipation: Secondary | ICD-10-CM

## 2013-12-09 DIAGNOSIS — Z96643 Presence of artificial hip joint, bilateral: Secondary | ICD-10-CM | POA: Insufficient documentation

## 2013-12-09 DIAGNOSIS — K219 Gastro-esophageal reflux disease without esophagitis: Secondary | ICD-10-CM | POA: Diagnosis not present

## 2013-12-09 DIAGNOSIS — K6389 Other specified diseases of intestine: Secondary | ICD-10-CM

## 2013-12-09 DIAGNOSIS — D123 Benign neoplasm of transverse colon: Secondary | ICD-10-CM | POA: Insufficient documentation

## 2013-12-09 DIAGNOSIS — F17210 Nicotine dependence, cigarettes, uncomplicated: Secondary | ICD-10-CM | POA: Insufficient documentation

## 2013-12-09 DIAGNOSIS — Z1211 Encounter for screening for malignant neoplasm of colon: Secondary | ICD-10-CM | POA: Insufficient documentation

## 2013-12-09 DIAGNOSIS — Z7982 Long term (current) use of aspirin: Secondary | ICD-10-CM | POA: Insufficient documentation

## 2013-12-09 DIAGNOSIS — Q438 Other specified congenital malformations of intestine: Secondary | ICD-10-CM | POA: Insufficient documentation

## 2013-12-09 DIAGNOSIS — E780 Pure hypercholesterolemia: Secondary | ICD-10-CM | POA: Diagnosis not present

## 2013-12-09 DIAGNOSIS — F418 Other specified anxiety disorders: Secondary | ICD-10-CM | POA: Insufficient documentation

## 2013-12-09 DIAGNOSIS — Z791 Long term (current) use of non-steroidal anti-inflammatories (NSAID): Secondary | ICD-10-CM | POA: Diagnosis not present

## 2013-12-09 HISTORY — PX: COLONOSCOPY: SHX5424

## 2013-12-09 SURGERY — COLONOSCOPY
Anesthesia: Moderate Sedation

## 2013-12-09 MED ORDER — MIDAZOLAM HCL 5 MG/5ML IJ SOLN
INTRAMUSCULAR | Status: AC
  Filled 2013-12-09: qty 10

## 2013-12-09 MED ORDER — STERILE WATER FOR IRRIGATION IR SOLN
Status: DC | PRN
  Administered 2013-12-09: 08:00:00

## 2013-12-09 MED ORDER — MIDAZOLAM HCL 5 MG/5ML IJ SOLN
INTRAMUSCULAR | Status: DC | PRN
  Administered 2013-12-09 (×3): 1 mg via INTRAVENOUS
  Administered 2013-12-09 (×2): 2 mg via INTRAVENOUS
  Administered 2013-12-09: 1 mg via INTRAVENOUS

## 2013-12-09 MED ORDER — SODIUM CHLORIDE 0.9 % IV SOLN
INTRAVENOUS | Status: DC
  Administered 2013-12-09: 07:00:00 via INTRAVENOUS

## 2013-12-09 MED ORDER — MEPERIDINE HCL 50 MG/ML IJ SOLN
INTRAMUSCULAR | Status: DC | PRN
  Administered 2013-12-09 (×2): 25 mg via INTRAVENOUS

## 2013-12-09 MED ORDER — MEPERIDINE HCL 50 MG/ML IJ SOLN
INTRAMUSCULAR | Status: AC
  Filled 2013-12-09: qty 1

## 2013-12-09 NOTE — Op Note (Signed)
COLONOSCOPY PROCEDURE REPORT  PATIENT:  Zoe [name]  MR#:  [id number] Birthdate:  01/01/1936, 78 y.o., female Endoscopist:  Dr. Rogene Houston, MD Referred By:  Dr. Alonza Bogus, MD Procedure Date: 12/09/2013  Procedure:   Colonoscopy  Indications:  [name] who is undergoing average risk screening colonoscopy. She has a lifelong history of constipation with dependent on laxatives. She also has been evaluated by Dr. Leighton Ruff of [facility] surgery. Asian also has undergone anorectal manometry but results are not available.  Informed Consent:  The procedure and risks were reviewed with the patient and informed consent was obtained.  Medications:  Demerol 50 mg IV Versed 8 mg IV  Description of procedure:  After a digital rectal exam was performed, that colonoscope was advanced from the anus through the rectum and colon to the area of the cecum, ileocecal valve and appendiceal orifice. The cecum was deeply intubated. These structures were well-seen and photographed for the record. From the level of the cecum and ileocecal valve, the scope was slowly and cautiously withdrawn. The mucosal surfaces were carefully surveyed utilizing scope tip to flexion to facilitate fold flattening as needed. The scope was pulled down into the rectum where a thorough exam including retroflexion was performed.  Findings:  . Prep satisfactory.  850 mL of liquid stool was suctioned out during the procedure. Redundant colon with diffuse changes of melanosis coli which are more pronounced in the proximal colon. 2 small cecal polyps ablated via cold biopsy and submitted together. Irregular shaped sessile polyp at splenic flexure with maximal diameter of 12 mm. It was biopsied for histology and rest of the polyp was ablated with argon plasma coagulator. Normal rectal mucosa. Small hemorrhoids above and below the dentate line.   Therapeutic/Diagnostic Maneuvers  Performed:  See above  Complications:  none  Cecal Withdrawal Time:  19  minutes  Impression:  Examination performed to cecum. 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 Two small cecal polyps ablated via cold biopsy and submitted together. Irregular shaped sessile polyp at splenic flexure was biopsied for histology and then ablated with argon plasma coagulator. Small internal and external hemorrhoids.   Recommendations:  Standard instructions given. No aspirin or NSAIDs for 1 week. I will contact patient with biopsy results and further recommendations.  Chanita Boden U  12/09/2013 8:34 AM  CC: Dr. Alonza Bogus, MD & Dr. Rayne Du ref. provider found CC Dr. Leighton Ruff, MD

## 2013-12-09 NOTE — Discharge Instructions (Signed)
No aspirin or NSAIDs for 1 week. °Resume usual medications and diet. °No driving for 24 hours. °Physician will call with biopsy results. ° °Colonoscopy, Care After °Refer to this sheet in the next few weeks. These instructions provide you with information on caring for yourself after your procedure. Your health care provider may also give you more specific instructions. Your treatment has been planned according to current medical practices, but problems sometimes occur. Call your health care provider if you have any problems or questions after your procedure. °WHAT TO EXPECT AFTER THE PROCEDURE  °After your procedure, it is typical to have the following: °· A small amount of blood in your stool. °· Moderate amounts of gas and mild abdominal cramping or bloating. °HOME CARE INSTRUCTIONS °· Do not drive, operate machinery, or sign important documents for 24 hours. °· You may shower and resume your regular physical activities, but move at a slower pace for the first 24 hours. °· Take frequent rest periods for the first 24 hours. °· Walk around or put a warm pack on your abdomen to help reduce abdominal cramping and bloating. °· Drink enough fluids to keep your urine clear or pale yellow. °· You may resume your normal diet as instructed by your health care provider. Avoid heavy or fried foods that are hard to digest. °· Avoid drinking alcohol for 24 hours or as instructed by your health care provider. °· Only take over-the-counter or prescription medicines as directed by your health care provider. °· If a tissue sample (biopsy) was taken during your procedure: °¨ Do not take aspirin or blood thinners for 7 days, or as instructed by your health care provider. °¨ Do not drink alcohol for 7 days, or as instructed by your health care provider. °¨ Eat soft foods for the first 24 hours. °SEEK MEDICAL CARE IF: °You have persistent spotting of blood in your stool 2-3 days after the procedure. °SEEK IMMEDIATE MEDICAL CARE  IF: °· You have more than a small spotting of blood in your stool. °· You pass large blood clots in your stool. °· Your abdomen is swollen (distended). °· You have nausea or vomiting. °· You have a fever. °· You have increasing abdominal pain that is not relieved with medicine. °Document Released: 08/28/2003 Document Revised: 11/03/2012 Document Reviewed: 09/20/2012 °[employer]® Patient Information ©2015 [employer]. This information is not intended to replace advice given to you by your health care provider. Make sure you discuss any questions you have with your health care provider. ° ° ° °

## 2013-12-09 NOTE — H&P (Signed)
Zoe [name] is an 78 y.o. female.   Chief Complaint:  Patient is here for colonoscopy. HPI:  [name] who is here for screening colonoscopy. She has several year history of chronic constipation and has been dependent on laxatives. Lately she has done well. She denies rectal bleeding anorexia weight loss. She has history of small bowel obstruction.she has been evaluated by Dr. Ninfa Linden who recommended watching her until symptoms relapse. Patient's last colonoscopy was 10 years ago. Family history is negative for CRC.   Past Medical History  Diagnosis Date  . GERD (gastroesophageal reflux disease)   . Chronic constipation   . Cough   . Nausea   . Hemorrhoids   . High cholesterol   . Asthma   . Environmental allergies   . Urgency of urination   . Arthritis   . Hypercholesteremia   . Hypertension   . Dysrhythmia     tachycardia  . Anxiety   . Depression   . Pneumonia     7+ YRS  . Fibromyalgia   . Cancer     skin CA  . Abdominal discomfort 11-02-12    suspected ?UTI- PCP MD placed on Cipro    Past Surgical History  Procedure Laterality Date  . Upper gastrointestinal endoscopy  08/03/2009    NUR  . Upper gastrointestinal endoscopy  03/29/2003    EGD ED TCS  . Colonoscopy  03/29/03  . Umbilical hernia repair  04/26/03    JENKINS  . Cholecystectomy    . Appendectomy    . Eye surgery      bilateral cataract removal  . Skin cancer excision      RIGHT NECK    . Tonsillectomy      T+A  . Back surgery      X2   . Total hip arthroplasty Right 09/14/2012    Procedure: RIGHT TOTAL HIP ARTHROPLASTY ANTERIOR APPROACH and RIGHT KNEE STEROID INJECTION;  Surgeon: Mcarthur Rossetti, MD;  Location: [place];  Service: Orthopedics;  Laterality: Right;  . Total hip arthroplasty Left 11/05/2012    Procedure: LEFT TOTAL HIP ARTHROPLASTY ANTERIOR APPROACH;  Surgeon: Mcarthur Rossetti, MD;  Location: [facility];  Service: Orthopedics;  Laterality:  Left;  . Anal rectal manometry N/A 11/28/2013    Procedure: ANO RECTAL MANOMETRY;  Surgeon: Leighton Ruff, MD;  [facility];  Service: Endoscopy;  Laterality: N/A;    Family History  Problem Relation Age of Onset  . Healthy Son   . Healthy Son   . Healthy Daughter    Social History:  reports that she has been smoking Cigarettes.  She has a 12.5 pack-year smoking history. She has never used smokeless tobacco. She reports that she drinks alcohol. She reports that she does not use illicit drugs.  Allergies:  Allergies  Allergen Reactions  . Codeine Other (See Comments)    Pains in abdominal area  . Morphine And Related     Abdominal pain  . Other     NOVACAINE     ITCHING  . Oxycodone     Abdominal pain    Medications Prior to Admission  Medication Sig Dispense Refill  . albuterol (PROVENTIL HFA;VENTOLIN HFA) 108 (90 BASE) MCG/ACT inhaler Inhale 2 puffs into the lungs every 6 (six) hours as needed for wheezing or shortness of breath.    Marland Kitchen albuterol (PROVENTIL) (2.5 MG/3ML) 0.083% nebulizer solution Take 2.5 mg by nebulization 2 (two) times daily as needed for wheezing or  shortness of breath.    . allopurinol (ZYLOPRIM) 300 MG tablet Take 300 mg by mouth daily.    Marland Kitchen ALPRAZolam (XANAX) 0.5 MG tablet Take 0.5 mg by mouth 2 (two) times daily as needed for anxiety.     Marland Kitchen amLODipine (NORVASC) 2.5 MG tablet Take 2.5 mg by mouth every other day. In the am    . aspirin EC 81 MG tablet Take 81 mg by mouth every morning.     . Biotin 1000 MCG tablet Take 1,000 mcg by mouth every morning.    . Coenzyme Q10 (CO Q 10) 10 MG CAPS Take 10 mg by mouth daily.    . digoxin (LANOXIN) 0.25 MG tablet Take 0.25 mg by mouth daily.     Marland Kitchen doxepin (SINEQUAN) 100 MG capsule Take 100 mg by mouth at bedtime.     . fexofenadine (ALLEGRA) 60 MG tablet Take 30 mg by mouth every morning.    . gabapentin (NEURONTIN) 300 MG capsule Take 300 mg by mouth 2 (two) times daily.     . naproxen (NAPROSYN) 500 MG  tablet Take 250 mg by mouth 2 (two) times daily.     Marland Kitchen omeprazole (PRILOSEC) 20 MG capsule Take 20 mg by mouth every morning.    . peg 3350 powder (MOVIPREP) 100 G SOLR Take 1 kit (200 g total) by mouth once. 1 kit 0  . polyethylene glycol (MIRALAX / GLYCOLAX) packet Take 17 g by mouth daily.    . pravastatin (PRAVACHOL) 20 MG tablet Take 20 mg by mouth every other day.     . psyllium (METAMUCIL) 58.6 % powder Take 1 packet by mouth daily.     . quiNINE (QUALAQUIN) 324 MG capsule Take 150-324 mg by mouth at bedtime. Patient states that she takes 150 mg by mouth one night , then the next night she takes 300 mg by mouth. Alternates.    . senna (SENOKOT) 8.6 MG tablet Take 4 tablets by mouth 2 (two) times daily.       No results found for this or any previous visit (from the past 48 hour(s)). No results found.  ROS  Blood pressure 139/74, pulse 61, temperature 97.8 F (36.6 C), temperature source Oral, resp. rate 18, height 5' 7.5" (1.715 m), weight 178 lb (80.74 kg), SpO2 96 %. Physical Exam  Constitutional: She appears well-nourished.  HENT:  Mouth/Throat: Oropharynx is clear and moist.  Eyes: Conjunctivae are normal. No scleral icterus.  Neck: No thyromegaly present.  Cardiovascular: Normal rate, regular rhythm and normal heart sounds.   No murmur heard. Respiratory: Effort normal and breath sounds normal.  GI: Soft. She exhibits no distension and no mass. There is no tenderness.  Musculoskeletal: She exhibits no edema.  Lymphadenopathy:    She has no cervical adenopathy.  Neurological: She is alert.  Skin: Skin is warm and dry.     Assessment/Plan Average risk screening colonoscopy. Chronic constipation.  REHMAN,NAJEEB U 12/09/2013, 7:31 AM

## 2013-12-12 ENCOUNTER — Encounter (HOSPITAL_COMMUNITY): Payer: Self-pay | Admitting: Internal Medicine

## 2013-12-19 ENCOUNTER — Encounter (INDEPENDENT_AMBULATORY_CARE_PROVIDER_SITE_OTHER): Payer: Self-pay | Admitting: *Deleted

## 2014-01-13 ENCOUNTER — Encounter (INDEPENDENT_AMBULATORY_CARE_PROVIDER_SITE_OTHER): Payer: Self-pay

## 2014-02-21 ENCOUNTER — Encounter (INDEPENDENT_AMBULATORY_CARE_PROVIDER_SITE_OTHER): Payer: Self-pay | Admitting: Internal Medicine

## 2014-02-21 ENCOUNTER — Ambulatory Visit (INDEPENDENT_AMBULATORY_CARE_PROVIDER_SITE_OTHER): Payer: PPO | Admitting: Internal Medicine

## 2014-02-21 VITALS — BP 130/72 | HR 78 | Temp 97.1°F | Resp 18 | Ht 67.5 in | Wt 179.8 lb

## 2014-02-21 DIAGNOSIS — K219 Gastro-esophageal reflux disease without esophagitis: Secondary | ICD-10-CM

## 2014-02-21 DIAGNOSIS — K5909 Other constipation: Secondary | ICD-10-CM

## 2014-02-21 DIAGNOSIS — K5900 Constipation, unspecified: Secondary | ICD-10-CM

## 2014-02-21 MED ORDER — LINACLOTIDE 290 MCG PO CAPS: 290.0000 ug | ORAL_CAPSULE | Freq: Every day | ORAL | Status: DC

## 2014-02-21 NOTE — Progress Notes (Signed)
Presenting complaint;  Constipation. Medications not working.  Database;  Patient is 70 old Caucasian female who has several year history of constipation with dependence on laxatives. She has been on senokot 5 tablets twice a day for several years. She managed to decrease the dose to 3 tablets twice a day when last seen on 11/21/2013. She was evaluated at [facility] surgery because of history of small bowel obstruction and history of constipation. She was seen by Dr. Leighton Ruff. She had colonoscopy by me on 12/09/2013 revealing redundant colon with melanosis coli and 3 tubular adenomas. Prior to her colonoscopy and she also [facility] Dr. Leighton Ruff and was within normal limits. Dr. Marcello Moores recommended continuing medical therapy for constipation.  Subjective:  Patient now returns with complaints of worsening constipation. She was not having good results. She would pass hard balls every couple of days. She also had sense of incomplete evacuation. She increase Senokot tablets to 10 per day but without any benefit. She also has been using Castor oil pills which has helped some. Last Friday she took 6 tablets of Ex-Lax and had reasonable evacuation. She states that she has no urge but unable to have bowel movement. She states she's been under a lot of stress and has not been eating right. She denies melena or rectal bleeding and she has not experience throbbing abdominal pain on the left side that she use to. Her weight is stable. Heartburn is well controlled with omeprazole.         Current Medications: Outpatient Encounter Prescriptions as of 02/21/2014  Medication Sig  . albuterol (PROVENTIL HFA;VENTOLIN HFA) 108 (90 BASE) MCG/ACT inhaler Inhale 2 puffs into the lungs every 6 (six) hours as needed for wheezing or shortness of breath.  Marland Kitchen albuterol (PROVENTIL) (2.5 MG/3ML) 0.083% nebulizer solution Take 2.5 mg by  nebulization 2 (two) times daily as needed for wheezing or shortness of breath.  . allopurinol (ZYLOPRIM) 300 MG tablet Take 300 mg by mouth daily.  Marland Kitchen ALPRAZolam (XANAX) 0.5 MG tablet Take 0.5 mg by mouth as needed for anxiety.   Marland Kitchen amLODipine (NORVASC) 2.5 MG tablet Take 2.5 mg by mouth every other day. In the am  . aspirin 81 MG tablet Take 81 mg by mouth daily.  . Biotin 1000 MCG tablet Take 1,000 mcg by mouth every morning.  Marland Kitchen CASTOR OIL PO Take by mouth daily.  . Coenzyme Q10 (CO Q 10) 10 MG CAPS Take 10 mg by mouth daily.  . digoxin (LANOXIN) 0.25 MG tablet Take 0.125 mg by mouth daily.   Marland Kitchen doxepin (SINEQUAN) 100 MG capsule Take 100 mg by mouth at bedtime.   . fexofenadine (ALLEGRA) 60 MG tablet Take 30 mg by mouth every morning.  . gabapentin (NEURONTIN) 300 MG capsule Take 300 mg by mouth 2 (two) times daily.   Marland Kitchen ibuprofen (ADVIL,MOTRIN) 800 MG tablet Take 800 mg by mouth daily as needed. Patient states that she rarely takes.  . Multiple Vitamins-Minerals (MULTI COMPLETE PO) Take by mouth as needed.  . naproxen (NAPROSYN) 500 MG tablet Take 500 mg by mouth 2 (two) times daily.   Marland Kitchen omeprazole (PRILOSEC) 20 MG capsule Take 20 mg by mouth every morning.  . polyethylene glycol (MIRALAX / GLYCOLAX) packet Take 17 g by mouth daily.  . pravastatin (PRAVACHOL) 20 MG tablet Take 20 mg by mouth every other day.   . quiNINE (QUALAQUIN) 324 MG capsule Take 324 mg  by mouth at bedtime. Patient states that she takes 150 mg by mouth one night , then the next night she takes 300 mg by mouth. Alternates.  . senna (SENOKOT) 8.6 MG tablet Take 5 tablets by mouth 2 (two) times daily.   . [DISCONTINUED] levofloxacin (LEVAQUIN) 500 MG tablet   . [DISCONTINUED] psyllium (METAMUCIL) 58.6 % powder Take 1 packet by mouth daily.      Objective: Blood pressure 130/72, pulse 78, temperature 97.1 F (36.2 C), temperature source Oral, resp. rate 18, height 5' 7.5" (1.715 m), weight 179 lb 12.8 oz (81.557  kg). Patient is alert and in no acute distress. Conjunctiva is pink. Sclera is nonicteric Oropharyngeal mucosa is normal. No neck masses or thyromegaly noted. Cardiac exam with regular rhythm normal S1 and S2. No murmur or gallop noted. Lungs are clear to auscultation. Abdomen is symmetrical. Bowel sounds are normal. On palpation abdomen is soft without tenderness organomegaly or masses.  No LE edema or clubbing noted.    Assessment:  #1. Chronic constipation with dependence on laxatives. No structural abnormality or stricture identified on recent colonoscopy of November 2015. Similarly she had normal anorectal manometry. 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 She was tried on Amitiza over 3 years ago and it did not work. #2. Chronic GERD. Heartburn well controlled with therapy. #3. History of colonic adenomas. Next colonoscopy would be in November 2018.   Plan:  Patient must consume high-fiber diet. Discontinue Castor oil. Continue polyethylene glycol as before. Linzess 290 g by mouth every morning. 28 day supply given along with prescription. Discontinue senokot. Fleets enema on as-needed basis or every third day. Follow with Dr. Luan Pulling to make sure she has not developed rectocele. Stool diary until office visit in 6 weeks.

## 2014-02-21 NOTE — Patient Instructions (Addendum)
Keep stool diary as to frequency and consistency of stools until office visit in 6 weeks. Use fleet enema every third day as needed. Continue polyethylene glycol but do not take castor oil or senna. Please ask Dr. Luan Pulling to examine you to make sure you do not have large rectocele.

## 2014-03-23 ENCOUNTER — Ambulatory Visit (HOSPITAL_COMMUNITY): Payer: PPO | Attending: Orthopaedic Surgery | Admitting: Specialist

## 2014-03-23 ENCOUNTER — Encounter (HOSPITAL_COMMUNITY): Payer: Self-pay | Admitting: Specialist

## 2014-03-23 DIAGNOSIS — M2560 Stiffness of unspecified joint, not elsewhere classified: Secondary | ICD-10-CM

## 2014-03-23 DIAGNOSIS — R531 Weakness: Secondary | ICD-10-CM

## 2014-03-23 DIAGNOSIS — M25512 Pain in left shoulder: Secondary | ICD-10-CM

## 2014-03-23 DIAGNOSIS — M62838 Other muscle spasm: Secondary | ICD-10-CM | POA: Insufficient documentation

## 2014-03-23 DIAGNOSIS — M6281 Muscle weakness (generalized): Secondary | ICD-10-CM | POA: Insufficient documentation

## 2014-03-23 DIAGNOSIS — M25612 Stiffness of left shoulder, not elsewhere classified: Secondary | ICD-10-CM | POA: Insufficient documentation

## 2014-03-23 DIAGNOSIS — M7542 Impingement syndrome of left shoulder: Secondary | ICD-10-CM

## 2014-03-23 NOTE — Patient Instructions (Signed)
COMPLETE EACH STRETCH 3-5 TIMES HOLDING FOR 10-15 SECONDS, COMPLETE 2-3 TIMES PER DAY [phone]   SHOULDER: Flexion On Table   Place hands on table, elbows straight. Move hips away from body. Press hands down into table. Hold ___ seconds. ___ reps per set, ___ sets per day, ___ days per week  Abduction (Passive)   With arm out to side, resting on table, lower head toward arm, keeping trunk away from table. Hold ____ seconds. Repeat ____ times. Do ____ sessions per day.  Copyright  VHI. All rights reserved.       Flexibility: Corner Stretch   Standing in corner with hands just above shoulder level and feet ____ inches from corner, lean forward until a comfortable stretch is felt across chest. Hold ____ seconds. Repeat ____ times per set. Do ____ sets per session. Do ____ sessions per day.  http://orth.exer.us/342   Copyright  VHI. All rights reserved.   Scapular Retraction (Standing)   With arms at sides, pinch shoulder blades together. Repeat ____ times per set. Do ____ sets per session. Do ____ sessions per day.  http://orth.exer.us/944   Copyright  VHI. All rights reserved.    Posterior Capsule Stretch   Stand or sit, one arm across body so hand rests over opposite shoulder. Gently push on crossed elbow with other hand until stretch is felt in shoulder of crossed arm. Hold ___ seconds.  Repeat ___ times per session. Do ___ sessions per day.  Copyright  VHI. All rights reserved.   Flexors Stretch, Standing   Stand near wall and slide arm up, with palm facing away from wall, by leaning toward wall. Hold ___ seconds.  Repeat ___ times per session. Do ___ sessions per day.  Copyright  VHI. All rights reserved.

## 2014-03-23 NOTE — Therapy (Signed)
[facility] [place], [place], [address] Phone: [phone]   Fax:  [phone]  Occupational Therapy Evaluation  Patient Details  Name: Zoe [name] MRN: [id number] Date of Birth: 14-Aug-1935 Referring Provider:  Mcarthur Rossetti*  Encounter Date: 03/23/2014      OT End of Session - 03/23/14 1445    Visit Number 1   Number of Visits 12   Date for OT Re-Evaluation 05/22/14  mini reassess 3/25   Authorization Type Medicare   Authorization Time Period before 10th visit   Authorization - Visit Number 1   Authorization - Number of Visits 10   OT Start Time 1305   OT Stop Time 1359   OT Time Calculation (min) 54 min   Activity Tolerance Patient tolerated treatment well   Behavior During Therapy [facility] for tasks assessed/performed      Past Medical History  Diagnosis Date  . GERD (gastroesophageal reflux disease)   . Chronic constipation   . Cough   . Nausea   . Hemorrhoids   . High cholesterol   . Asthma   . Environmental allergies   . Urgency of urination   . Arthritis   . Hypercholesteremia   . Hypertension   . Dysrhythmia     tachycardia  . Anxiety   . Depression   . Pneumonia     7+ YRS  . Fibromyalgia   . Cancer     skin CA  . Abdominal discomfort 11-02-12    suspected ?UTI- PCP MD placed on Cipro    Past Surgical History  Procedure Laterality Date  . Upper gastrointestinal endoscopy  08/03/2009    NUR  . Upper gastrointestinal endoscopy  03/29/2003    EGD ED TCS  . Colonoscopy  03/29/03  . Umbilical hernia repair  04/26/03    JENKINS  . Cholecystectomy    . Appendectomy    . Eye surgery      bilateral cataract removal  . Skin cancer excision      RIGHT NECK    . Tonsillectomy      T+A  . Back surgery      X2   . Total hip arthroplasty Right 09/14/2012    Procedure: RIGHT TOTAL HIP ARTHROPLASTY ANTERIOR APPROACH and RIGHT KNEE STEROID INJECTION;  Surgeon: Mcarthur Rossetti,  MD;  Location: [place];  Service: Orthopedics;  Laterality: Right;  . Total hip arthroplasty Left 11/05/2012    Procedure: LEFT TOTAL HIP ARTHROPLASTY ANTERIOR APPROACH;  Surgeon: Mcarthur Rossetti, MD;  [facility];  Service: Orthopedics;  Laterality: Left;  . Anal rectal manometry N/A 11/28/2013    Procedure: ANO RECTAL MANOMETRY;  Surgeon: Leighton Ruff, MD;  [facility];  Service: Endoscopy;  Laterality: N/A;  . Colonoscopy N/A 12/09/2013    Procedure: COLONOSCOPY;  Surgeon: Rogene Houston, MD;  [facility];  Service: Endoscopy;  Laterality: N/A;  730    There were no vitals taken for this visit.  Visit Diagnosis:  Pain in joint, shoulder region, left - Plan: OT PLAN OF CARE CERT/RE-CERT  Limited joint range of motion (ROM) - Plan: OT PLAN OF CARE CERT/RE-CERT  Decreased strength - Plan: OT PLAN OF CARE CERT/RE-CERT  Impingement syndrome of left shoulder - Plan: OT PLAN OF CARE CERT/RE-CERT      Subjective Assessment - 03/23/14 1427    Symptoms S:  I have pain and discomfort, but it doesnt keep me from doing what I  am supposed to be doing.   Pertinent History Zoe [name] has been experiencing increased pain and decreased mobility in her left shoulder for several months.  She consulted with Dr. Rush Farmer and was diagnosed with impingement syndrome.  He gave her a cortisone injection and has referred her to occupational therapy for evaluation and treatment.   Patient Stated Goals I want to be able to do my daily activities without it hurting.   Currently in Pain? Yes   Pain Score 6    Pain Location Shoulder   Pain Orientation Left   Pain Descriptors / Indicators Aching   Pain Type Acute pain   Pain Onset More than a month ago   Pain Frequency Intermittent          [facility] OT Assessment - 03/23/14 0001    Assessment   Diagnosis Left Shoulder Impingement   Onset Date --  several months ago   Precautions   Precautions None   Restrictions    Weight Bearing Restrictions No   Balance Screen   Has the patient fallen in the past 6 months No   Home  Environment   Lives With Spouse   Prior Function   Level of Independence --  Independent with B/IADLs, leisure   Vocation Retired   Leisure spending time with grandchildren   ADL   ADL comments increased pain with reaching overhead, pain when lying on her left side, pain when reaching behind her back   Written Expression   Dominant Hand Left   Cognition   Overall Cognitive Status Within Functional Limits for tasks assessed   Observation/Other Assessments   Observations 80% independent based on clinical observation   Focus on Therapeutic Outcomes (FOTO)  in process   Sensation   Light Touch Appears Intact   Coordination   Gross Motor Movements are Fluid and Coordinated Yes   Fine Motor Movements are Fluid and Coordinated Yes   Palpation   Palpation moderate fascial restrictions noted in her left shoulder region   AROM   Overall AROM Comments assessed in seated, ER/IR with shoulder abducted   Left Shoulder Flexion 130 Degrees   Left Shoulder ABduction 70 Degrees   Left Shoulder Internal Rotation 40 Degrees   Left Shoulder External Rotation 74 Degrees   PROM   Overall PROM Comments assessed in supine - [facility]   Strength   Left Shoulder Flexion --  5-/5   Left Shoulder ABduction --  5-/5   Left Shoulder Internal Rotation --  5-/5   Left Shoulder External Rotation --  5-/5                OT Treatments/Exercises (OP) - 03/23/14 0001    Modalities   Modalities Electrical Stimulation   Electrical Stimulation   Electrical Stimulation Location left shoulder with moist heat   Electrical Stimulation Action sweeping continuous   Electrical Stimulation Parameters 13.0 volts 80-150 mhz   Electrical Stimulation Goals Pain   Manual Therapy   Manual Therapy Myofascial release   Myofascial Release MFR and manual stretching to left upper arm, scapular, shoulder region to  decrease pain and restrictions and improve pain free mobility.               OT Education - 03/23/14 1445    Education provided Yes   Education Details Educated patient on towel slides and shoulder stretches   Person(s) Educated Patient   Methods Explanation;Demonstration;Handout   Comprehension Verbalized understanding;Returned demonstration  OT Short Term Goals - 04-14-2014 1450    OT SHORT TERM GOAL #1   Title Patient will be educated on a HEP.   Time 3   Period Weeks   Status New   OT SHORT TERM GOAL #2   Title Patient will improve left shoulder AROM by 10 degrees for increased ability to reach her bra strap.   Time 3   Period Weeks   Status New   OT SHORT TERM GOAL #3   Title Patient will decrease pain to 4/10 in left shoulder when completing daily activities.    Time 3   Period Weeks   Status New   OT SHORT TERM GOAL #4   Title Patient will decrease fascial restrictions to minimal in her left shoulder region.    Time 3   Period Weeks   Status New           OT Long Term Goals - 14-Apr-2014 1715    OT LONG TERM GOAL #1   Title Patient will complete all B/IADLs without pain in her left arm, using left arm as dominant.   Time 6   Period Weeks   Status New   OT LONG TERM GOAL #2   Title Patient will improve left shoulder AROM to WNL for increased ability to reach behind her back and overhead.   Time 6   Period Weeks   Status New   OT LONG TERM GOAL #3   Title Patient will demonstrate 5/5 strength in her left shoulder for increased ability to pick up her great granddaughter.   Time 6   Period Weeks   Status New   OT LONG TERM GOAL #4   Title Patient will decrease pain in her left shoulder to 2/10 or less when completing ADLs and sleeping at night.   Time 6   Period Weeks   Status New   OT LONG TERM GOAL #5   Title Patient will decrease fascial restrictions to trace in her left arm.    Time 6   Period Weeks   Status New                Plan - 04/14/14 1446    Clinical Impression Statement A:  Patient is a 79 year old that has been referred to occupational therapy secondary to left shoulder impingement syndrome.  The patient's chief complaints are pain during functional activities and pain in her left shoulder when lying on it at night.     Pt will benefit from skilled therapeutic intervention in order to improve on the following deficits (Retired) Decreased range of motion;Decreased strength;Increased fascial restricitons;Increased muscle spasms;Pain   Rehab Potential Good   OT Frequency 2x / week   OT Duration 6 weeks   OT Treatment/Interventions Self-care/ADL training;Moist Heat;Electrical Stimulation;Ultrasound;Therapeutic exercise;Manual Therapy;Therapeutic activities;Patient/family education   Plan P:  Skilled OT intervention to decrease pain and restrictions and improve pain free mobility and strength in her left shoulder region, in order for patient to return to ablity to complete daily activities pain free and sleep at night.  Treatment Plan:  MFR and manual stretching, PROM, AAROM, scapular stability exercises, IFES.     Consulted and Agree with Plan of Care Patient          G-Codes - 04/14/2014 1719    Functional Assessment Tool Used clinical observation   Functional Limitation Carrying, moving and handling objects   Carrying, Moving and Handling Objects Current Status (B6389) At least 1 percent but  less than 20 percent impaired, limited or restricted   Carrying, Moving and Handling Objects Goal Status (P8099) 0 percent impaired, limited or restricted      Problem List Patient Active Problem List   Diagnosis Date Noted  . SBO (small bowel obstruction) 03/02/2013  . Hyperlipidemia 12/08/2012  . Gout 12/08/2012  . Dysrhythmia 12/08/2012  . Depression 12/08/2012  . Asthma 12/08/2012  . Fibromyalgia 12/08/2012  . Constipation 12/08/2012  . Acute posthemorrhagic anemia 12/07/2012  . Essential hypertension,  benign 12/07/2012  . Degenerative arthritis of hip, left 11/05/2012  . Arthritis pain of hip 09/14/2012  . Weakness of right leg 03/24/2011  . OA (osteoarthritis) 03/24/2011  . Stiffness of cervical spine 03/24/2011  . Chronic constipation 11/19/2010  . GERD (gastroesophageal reflux disease) 11/19/2010  . SPINAL STENOSIS 04/18/2009  . HIP PAIN 11/17/2007  . DEGEN LUMBAR/LUMBOSACRAL INTERVERTEBRAL DISC 11/17/2007  . ANSERINE BURSITIS, RIGHT 08/11/2007  . SCIATICA 06/22/2007  . KNEE PAIN 03/17/2007    Vangie Bicker, OTR/L [id number]  03/23/2014, 5:25 PM  [facility] [address] [place], [place], [address] Phone: [phone]   Fax:  [phone]

## 2014-03-28 ENCOUNTER — Encounter (HOSPITAL_COMMUNITY): Payer: Self-pay

## 2014-03-28 ENCOUNTER — Ambulatory Visit (HOSPITAL_COMMUNITY): Payer: PPO | Attending: Orthopaedic Surgery

## 2014-03-28 DIAGNOSIS — M25512 Pain in left shoulder: Secondary | ICD-10-CM | POA: Diagnosis present

## 2014-03-28 DIAGNOSIS — M6281 Muscle weakness (generalized): Secondary | ICD-10-CM | POA: Diagnosis not present

## 2014-03-28 DIAGNOSIS — R531 Weakness: Secondary | ICD-10-CM

## 2014-03-28 DIAGNOSIS — M25612 Stiffness of left shoulder, not elsewhere classified: Secondary | ICD-10-CM | POA: Insufficient documentation

## 2014-03-28 DIAGNOSIS — M62838 Other muscle spasm: Secondary | ICD-10-CM | POA: Diagnosis not present

## 2014-03-28 DIAGNOSIS — M2560 Stiffness of unspecified joint, not elsewhere classified: Secondary | ICD-10-CM

## 2014-03-28 NOTE — Therapy (Signed)
[place], [place], [address] Phone: [phone]   Fax:  [phone]  Occupational Therapy Treatment  Patient Details  Name: Zoe [name] MRN: [id number] Date of Birth: [birth year] Referring Provider:  Mcarthur Rossetti*  Encounter Date: 03/28/2014      OT End of Session - 03/28/14 1200    Visit Number 2   Number of Visits 12   Date for OT Re-Evaluation 05/22/14  mini reassess 3/25   Authorization Type Medicare   Authorization Time Period before 10th visit   Authorization - Visit Number 2   Authorization - Number of Visits 10   OT Start Time 1027   OT Stop Time 1101   OT Time Calculation (min) 34 min   Activity Tolerance Patient tolerated treatment well   Behavior During Therapy [facility] for tasks assessed/performed      Past Medical History  Diagnosis Date  . GERD (gastroesophageal reflux disease)   . Chronic constipation   . Cough   . Nausea   . Hemorrhoids   . High cholesterol   . Asthma   . Environmental allergies   . Urgency of urination   . Arthritis   . Hypercholesteremia   . Hypertension   . Dysrhythmia     tachycardia  . Anxiety   . Depression   . Pneumonia     7+ YRS  . Fibromyalgia   . Cancer     skin CA  . Abdominal discomfort 11-02-12    suspected ?UTI- PCP MD placed on Cipro    Past Surgical History  Procedure Laterality Date  . Upper gastrointestinal endoscopy  08/03/2009    NUR  . Upper gastrointestinal endoscopy  03/29/2003    EGD ED TCS  . Colonoscopy  03/29/03  . Umbilical hernia repair  04/26/03    JENKINS  . Cholecystectomy    . Appendectomy    . Eye surgery      bilateral cataract removal  . Skin cancer excision      RIGHT NECK    . Tonsillectomy      T+A  . Back surgery      X2   . Total hip arthroplasty Right 09/14/2012    Procedure: RIGHT TOTAL HIP ARTHROPLASTY ANTERIOR APPROACH and RIGHT KNEE STEROID INJECTION;  Surgeon: Mcarthur Rossetti,  MD;  Location: [facility];  Service: Orthopedics;  Laterality: Right;  . Total hip arthroplasty Left 11/05/2012    Procedure: LEFT TOTAL HIP ARTHROPLASTY ANTERIOR APPROACH;  Surgeon: Mcarthur Rossetti, MD;  [facility];  Service: Orthopedics;  Laterality: Left;  . Anal rectal manometry N/A 11/28/2013    Procedure: ANO RECTAL MANOMETRY;  Surgeon: Leighton Ruff, MD;  [facility];  Service: Endoscopy;  Laterality: N/A;  . Colonoscopy N/A 12/09/2013    Procedure: COLONOSCOPY;  Surgeon: Rogene Houston, MD;  [facility];  Service: Endoscopy;  Laterality: N/A;  730    There were no vitals taken for this visit.  Visit Diagnosis:  Pain in joint, shoulder region, left  Limited joint range of motion (ROM)  Decreased strength      Subjective Assessment - 03/28/14 1057    Symptoms S: My arm is always really sore in the mornings when I wake up.    Special Tests FOTO Score: 59/100 (41% impairment)   Currently in Pain? Yes   Pain Score 6    Pain Location Shoulder   Pain Orientation Left   Pain Descriptors /  Indicators Aching   Pain Type Acute pain          [facility] OT Assessment - 03/28/14 1059    Assessment   Diagnosis Left Shoulder Impingement   Precautions   Precautions None               OT Treatments/Exercises (OP) - 03/28/14 1058    Shoulder Exercises: Supine   Protraction PROM;5 reps;AAROM;10 reps   External Rotation PROM;5 reps;AAROM;10 reps   Internal Rotation PROM;5 reps;AAROM;10 reps   Flexion PROM;5 reps;AAROM;10 reps   Modalities   Modalities Electrical Stimulation;Moist Heat   Moist Heat Therapy   Number Minutes Moist Heat 12 Minutes   Moist Heat Location Shoulder   Electrical Stimulation   Electrical Stimulation Location left shoulder   Electrical Stimulation Action interferential   Electrical Stimulation Parameters 12.0 volts  12' 11:19-11:31   Electrical Stimulation Goals Pain   Manual Therapy   Manual Therapy Myofascial  release   Myofascial Release MFR and manual stretching to left upper arm, scapular, shoulder region to decrease pain and restrictions and improve pain free mobility.                  OT Short Term Goals - 03/28/14 1203    OT SHORT TERM GOAL #1   Title Patient will be educated on a HEP.   Time 3   Period Weeks   Status On-going   OT SHORT TERM GOAL #2   Title Patient will improve left shoulder AROM by 10 degrees for increased ability to reach her bra strap.   Time 3   Period Weeks   Status On-going   OT SHORT TERM GOAL #3   Title Patient will decrease pain to 4/10 in left shoulder when completing daily activities.    Time 3   Period Weeks   Status On-going   OT SHORT TERM GOAL #4   Title Patient will decrease fascial restrictions to minimal in her left shoulder region.    Time 3   Period Weeks   Status On-going   OT SHORT TERM GOAL #5   Status On-going           OT Long Term Goals - 03/28/14 1203    OT LONG TERM GOAL #1   Title Patient will complete all B/IADLs without pain in her left arm, using left arm as dominant.   Time 6   Period Weeks   Status On-going   OT LONG TERM GOAL #2   Title Patient will improve left shoulder AROM to WNL for increased ability to reach behind her back and overhead.   Time 6   Period Weeks   Status On-going   OT LONG TERM GOAL #3   Title Patient will demonstrate 5/5 strength in her left shoulder for increased ability to pick up her great granddaughter.   Time 6   Period Weeks   Status On-going   OT LONG TERM GOAL #4   Title Patient will decrease pain in her left shoulder to 2/10 or less when completing ADLs and sleeping at night.   Time 6   Period Weeks   Status On-going   OT LONG TERM GOAL #5   Title Patient will decrease fascial restrictions to trace in her left arm.    Time 6   Period Weeks   Status On-going               Plan - 03/28/14 1201    Clinical Impression Statement A:  Foto completed at beginning  of session. Initiated myofascial release, manual therapy, and PROM/AAROM exercises. E-stim applied at end of session for 12 minutes for pain control; pt had to wait for TENS unit to become available for use. Pt used TENS unit from 11:19-11:31Patient tolerated treatment well. Patient reports pain in the mornings that decreases throughout the day.    Plan P: Continue AAROM exercises, add horizontal abduction, abduction. Attempt scapular theraband.         Problem List Patient Active Problem List   Diagnosis Date Noted  . SBO (small bowel obstruction) 03/02/2013  . Hyperlipidemia 12/08/2012  . Gout 12/08/2012  . Dysrhythmia 12/08/2012  . Depression 12/08/2012  . Asthma 12/08/2012  . Fibromyalgia 12/08/2012  . Constipation 12/08/2012  . Acute posthemorrhagic anemia 12/07/2012  . Essential hypertension, benign 12/07/2012  . Degenerative arthritis of hip, left 11/05/2012  . Arthritis pain of hip 09/14/2012  . Weakness of right leg 03/24/2011  . OA (osteoarthritis) 03/24/2011  . Stiffness of cervical spine 03/24/2011  . Chronic constipation 11/19/2010  . GERD (gastroesophageal reflux disease) 11/19/2010  . SPINAL STENOSIS 04/18/2009  . HIP PAIN 11/17/2007  . DEGEN LUMBAR/LUMBOSACRAL INTERVERTEBRAL DISC 11/17/2007  . ANSERINE BURSITIS, RIGHT 08/11/2007  . SCIATICA 06/22/2007  . KNEE PAIN 03/17/2007    Ailene Ravel, OTR/L,CBIS  [phone]  03/28/2014, 2:56 PM  [facility] [address] [place], [place], [address] Phone: [phone]   Fax:  [phone]

## 2014-03-31 ENCOUNTER — Encounter (HOSPITAL_COMMUNITY): Payer: Self-pay | Admitting: Occupational Therapy

## 2014-03-31 ENCOUNTER — Ambulatory Visit (HOSPITAL_COMMUNITY): Payer: PPO | Admitting: Occupational Therapy

## 2014-03-31 DIAGNOSIS — M25512 Pain in left shoulder: Secondary | ICD-10-CM | POA: Diagnosis not present

## 2014-03-31 DIAGNOSIS — M2560 Stiffness of unspecified joint, not elsewhere classified: Secondary | ICD-10-CM

## 2014-03-31 NOTE — Therapy (Signed)
[place], [place], [address] Phone: [phone]   Fax:  [phone]  Occupational Therapy Treatment  Patient Details  Name: Zoe [name] MRN: [id number] Date of Birth: [birth year] Referring Provider:  Mcarthur Rossetti*  Encounter Date: 03/31/2014      OT End of Session - 03/31/14 1101    Visit Number 3   Number of Visits 12   Date for OT Re-Evaluation 05/22/14  mini reassess 3/25   Authorization Type Medicare   Authorization Time Period before 10th visit   Authorization - Visit Number 3   Authorization - Number of Visits 10   OT Start Time 1027   OT Stop Time 1109   OT Time Calculation (min) 42 min   Activity Tolerance Patient tolerated treatment well   Behavior During Therapy [facility] for tasks assessed/performed      Past Medical History  Diagnosis Date  . GERD (gastroesophageal reflux disease)   . Chronic constipation   . Cough   . Nausea   . Hemorrhoids   . High cholesterol   . Asthma   . Environmental allergies   . Urgency of urination   . Arthritis   . Hypercholesteremia   . Hypertension   . Dysrhythmia     tachycardia  . Anxiety   . Depression   . Pneumonia     7+ YRS  . Fibromyalgia   . Cancer     skin CA  . Abdominal discomfort 11-02-12    suspected ?UTI- PCP MD placed on Cipro    Past Surgical History  Procedure Laterality Date  . Upper gastrointestinal endoscopy  08/03/2009    NUR  . Upper gastrointestinal endoscopy  03/29/2003    EGD ED TCS  . Colonoscopy  03/29/03  . Umbilical hernia repair  04/26/03    JENKINS  . Cholecystectomy    . Appendectomy    . Eye surgery      bilateral cataract removal  . Skin cancer excision      RIGHT NECK    . Tonsillectomy      T+A  . Back surgery      X2   . Total hip arthroplasty Right 09/14/2012    Procedure: RIGHT TOTAL HIP ARTHROPLASTY ANTERIOR APPROACH and RIGHT KNEE STEROID INJECTION;  Surgeon: Mcarthur Rossetti,  MD;  Location: [facility];  Service: Orthopedics;  Laterality: Right;  . Total hip arthroplasty Left 11/05/2012    Procedure: LEFT TOTAL HIP ARTHROPLASTY ANTERIOR APPROACH;  Surgeon: Mcarthur Rossetti, MD;  [facility];  Service: Orthopedics;  Laterality: Left;  . Anal rectal manometry N/A 11/28/2013    Procedure: ANO RECTAL MANOMETRY;  Surgeon: Leighton Ruff, MD;  [facility];  Service: Endoscopy;  Laterality: N/A;  . Colonoscopy N/A 12/09/2013    Procedure: COLONOSCOPY;  Surgeon: Rogene Houston, MD;  [facility];  Service: Endoscopy;  Laterality: N/A;  730    There were no vitals taken for this visit.  Visit Diagnosis:  Pain in joint, shoulder region, left  Limited joint range of motion (ROM)      Subjective Assessment - 03/31/14 1029    Symptoms S: My arm is sore today, but I used it a lot yesterday when I was cleaning around my house.    Currently in Pain? Yes   Pain Score 6    Pain Location Shoulder   Pain Orientation Left   Pain Descriptors / Indicators Aching   Pain  Type Acute pain          [facility] OT Assessment - 03/31/14 1028    Assessment   Diagnosis Left Shoulder Impingement   Precautions   Precautions None               OT Treatments/Exercises (OP) - 03/31/14 1030    Shoulder Exercises: Supine   Protraction PROM;AAROM;12 reps   External Rotation PROM;AAROM;12 reps   Internal Rotation PROM;AAROM;12 reps   Flexion PROM;AAROM;12 reps   ABduction PROM;AAROM;12 reps   Modalities   Modalities Electrical Stimulation;Moist Heat   Moist Heat Therapy   Number Minutes Moist Heat 10 Minutes   Moist Heat Location Shoulder   Electrical Stimulation   Electrical Stimulation Location left shoulder   Electrical Stimulation Action interferential    Electrical Stimulation Parameters 16.5 volts   Electrical Stimulation Goals Pain   Manual Therapy   Manual Therapy Myofascial release   Myofascial Release MFR and manual stretching to left  upper arm, scapular, shoulder region to decrease pain and restrictions and improve pain free mobility.                  OT Short Term Goals - 03/28/14 1203    OT SHORT TERM GOAL #1   Title Patient will be educated on a HEP.   Time 3   Period Weeks   Status On-going   OT SHORT TERM GOAL #2   Title Patient will improve left shoulder AROM by 10 degrees for increased ability to reach her bra strap.   Time 3   Period Weeks   Status On-going   OT SHORT TERM GOAL #3   Title Patient will decrease pain to 4/10 in left shoulder when completing daily activities.    Time 3   Period Weeks   Status On-going   OT SHORT TERM GOAL #4   Title Patient will decrease fascial restrictions to minimal in her left shoulder region.    Time 3   Period Weeks   Status On-going   OT SHORT TERM GOAL #5   Status On-going           OT Long Term Goals - 03/28/14 1203    OT LONG TERM GOAL #1   Title Patient will complete all B/IADLs without pain in her left arm, using left arm as dominant.   Time 6   Period Weeks   Status On-going   OT LONG TERM GOAL #2   Title Patient will improve left shoulder AROM to WNL for increased ability to reach behind her back and overhead.   Time 6   Period Weeks   Status On-going   OT LONG TERM GOAL #3   Title Patient will demonstrate 5/5 strength in her left shoulder for increased ability to pick up her great granddaughter.   Time 6   Period Weeks   Status On-going   OT LONG TERM GOAL #4   Title Patient will decrease pain in her left shoulder to 2/10 or less when completing ADLs and sleeping at night.   Time 6   Period Weeks   Status On-going   OT LONG TERM GOAL #5   Title Patient will decrease fascial restrictions to trace in her left arm.    Time 6   Period Weeks   Status On-going               Plan - 03/31/14 1101    Clinical Impression Statement A: Completed all AAROM exercises today, 12  repetitions. Did not complete scapular theraband  exercises due to time constraints. Pt reports she used her arm a lot yesterday when cleaning out closets and lifting bags/boxes. ES and moist heat applied at end of session. Patient tolerated treatment well.    Plan P: Add AAROM exercises in standing. Attempt scapular theraband.         Problem List Patient Active Problem List   Diagnosis Date Noted  . SBO (small bowel obstruction) 03/02/2013  . Hyperlipidemia 12/08/2012  . Gout 12/08/2012  . Dysrhythmia 12/08/2012  . Depression 12/08/2012  . Asthma 12/08/2012  . Fibromyalgia 12/08/2012  . Constipation 12/08/2012  . Acute posthemorrhagic anemia 12/07/2012  . Essential hypertension, benign 12/07/2012  . Degenerative arthritis of hip, left 11/05/2012  . Arthritis pain of hip 09/14/2012  . Weakness of right leg 03/24/2011  . OA (osteoarthritis) 03/24/2011  . Stiffness of cervical spine 03/24/2011  . Chronic constipation 11/19/2010  . GERD (gastroesophageal reflux disease) 11/19/2010  . SPINAL STENOSIS 04/18/2009  . HIP PAIN 11/17/2007  . DEGEN LUMBAR/LUMBOSACRAL INTERVERTEBRAL DISC 11/17/2007  . ANSERINE BURSITIS, RIGHT 08/11/2007  . SCIATICA 06/22/2007  . KNEE PAIN 03/17/2007   Guadelupe Sabin, OTR/L [phone]  03/31/2014, 11:26 AM  [facility] [address] [place], [place], [address] Phone: [phone]   Fax:  [phone]

## 2014-04-04 ENCOUNTER — Encounter (HOSPITAL_COMMUNITY): Payer: PPO | Admitting: Specialist

## 2014-04-05 ENCOUNTER — Ambulatory Visit (HOSPITAL_COMMUNITY): Payer: PPO | Admitting: Occupational Therapy

## 2014-04-06 ENCOUNTER — Encounter (HOSPITAL_COMMUNITY): Payer: PPO | Admitting: Occupational Therapy

## 2014-04-07 ENCOUNTER — Ambulatory Visit (HOSPITAL_COMMUNITY): Payer: PPO | Admitting: Occupational Therapy

## 2014-04-07 ENCOUNTER — Encounter (HOSPITAL_COMMUNITY): Payer: Self-pay | Admitting: Occupational Therapy

## 2014-04-07 DIAGNOSIS — M25512 Pain in left shoulder: Secondary | ICD-10-CM

## 2014-04-07 DIAGNOSIS — M2560 Stiffness of unspecified joint, not elsewhere classified: Secondary | ICD-10-CM
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
## 2014-04-10 ENCOUNTER — Encounter (INDEPENDENT_AMBULATORY_CARE_PROVIDER_SITE_OTHER): Payer: Self-pay | Admitting: Internal Medicine

## 2014-04-10 ENCOUNTER — Ambulatory Visit (INDEPENDENT_AMBULATORY_CARE_PROVIDER_SITE_OTHER): Payer: PPO | Admitting: Internal Medicine

## 2014-04-10 VITALS — BP 140/80 | HR 72 | Temp 98.0°F | Resp 18 | Ht 67.5 in | Wt 176.8 lb

## 2014-04-10 DIAGNOSIS — K5909 Other constipation: Secondary | ICD-10-CM

## 2014-04-10 DIAGNOSIS — K5900 Constipation, unspecified: Secondary | ICD-10-CM

## 2014-04-10 NOTE — Progress Notes (Signed)
Presenting complaint;  Follow-up for chronic constipation.  Subjective:  Patient is 89 old Caucasian female who is here for scheduled visit. She has several year history of constipation and dependence on laxatives. She was last seen on 02/21/2014 and begun on Linaclotide and asked to keep stool diary. She feels she is doing better. She has kept stool diary as recommended. Review of stool artery reveals that she had bowel movements on 34 out of 50 days. On 14 of these days she had normal or near-normal stool. She is not having any side effects with Linaclotide. She has cut back on senna tablet 23 twice daily. She remains on polyethylene glycol. She says she gained weight up to 185 pounds and therefore joined [employer]. Since her last visit she only has lost 3 pounds. She states PPI is working. About 10 days ago while straining she noted painful lump below the left costal margin but she was able to push it back in. This has not reoccurred. She is not having throbbing pain and left lower quadrant for abdomen which he had for several months.   Current Medications: Outpatient Encounter Prescriptions as of 04/10/2014  Medication Sig  . albuterol (PROVENTIL HFA;VENTOLIN HFA) 108 (90 BASE) MCG/ACT inhaler Inhale 2 puffs into the lungs every 6 (six) hours as needed for wheezing or shortness of breath.  Marland Kitchen albuterol (PROVENTIL) (2.5 MG/3ML) 0.083% nebulizer solution Take 2.5 mg by nebulization 2 (two) times daily as needed for wheezing or shortness of breath.  . allopurinol (ZYLOPRIM) 300 MG tablet Take 300 mg by mouth daily.  Marland Kitchen ALPRAZolam (XANAX) 0.5 MG tablet Take 0.5 mg by mouth as needed for anxiety.   Marland Kitchen amLODipine (NORVASC) 2.5 MG tablet Take 2.5 mg by mouth every other day. In the am  . aspirin 81 MG tablet Take 81 mg by mouth daily.  . Biotin 1000 MCG tablet Take 1,000 mcg by mouth every morning.  . Coenzyme Q10 (CO Q 10) 10 MG CAPS Take 10 mg by mouth daily.  . digoxin (LANOXIN) 0.25 MG  tablet Take 0.125 mg by mouth daily.   Marland Kitchen doxepin (SINEQUAN) 100 MG capsule Take 100 mg by mouth at bedtime.   . fexofenadine (ALLEGRA) 60 MG tablet Take 30 mg by mouth at bedtime.   . gabapentin (NEURONTIN) 300 MG capsule Take 300 mg by mouth 2 (two) times daily.   Marland Kitchen ibuprofen (ADVIL,MOTRIN) 800 MG tablet Take 800 mg by mouth daily as needed. Patient states that she rarely takes.  . Linaclotide (LINZESS) 290 MCG CAPS capsule Take 1 capsule (290 mcg total) by mouth daily.  . Multiple Vitamins-Minerals (MULTI COMPLETE PO) Take by mouth as needed.  . naproxen (NAPROSYN) 500 MG tablet Take 500 mg by mouth 2 (two) times daily.   Marland Kitchen omeprazole (PRILOSEC) 20 MG capsule Take 20 mg by mouth every morning.  . polyethylene glycol (MIRALAX / GLYCOLAX) packet Take 17 g by mouth daily.  . pravastatin (PRAVACHOL) 20 MG tablet Take 20 mg by mouth every other day.   . quiNINE (QUALAQUIN) 324 MG capsule Take 324 mg by mouth at bedtime. Patient states that she takes 150 mg by mouth one night , then the next night she takes 300 mg by mouth. Alternates.     Objective: Blood pressure 140/80, pulse 72, temperature 98 F (36.7 C), temperature source Oral, resp. rate 18, height 5' 7.5" (1.715 m), weight 176 lb 12.8 oz (80.196 kg). Patient is alert and in no acute distress. Conjunctiva is pink. Sclera  is nonicteric Oropharyngeal mucosa is normal. No neck masses or thyromegaly noted. Cardiac exam with regular rhythm normal S1 and S2. No murmur or gallop noted. Lungs are clear to auscultation. Abdomen is symmetrical. There is right paramedian scar. On palpation abdomen is soft and nontender without organomegaly or masses. No cough impulse noted below left costal margin May she experienced painful lump both on supine and standing position.  No LE edema or clubbing noted.    Assessment:  #1. Chronic constipation with laxative dependence. She is doing better with combination of high fiber diet, polyethylene glycol  and Linaclotide. He has been able to decrease senna to 6 tablets per day. She used to be on 10 tablets daily. #2. NSAID use. Patient is a Naprosyn 500 mg twice daily. He is also using ibuprofen on as-needed basis. She is also on low-dose aspirin. Therefore she should not take ibuprofen or other medications. #3. Chronic GERD. Symptoms well controlled with therapy.   Plan:  Ibuprofen removed from patient's list of medications and patient advised not to take ibuprofen or other OTC NSAIDs while she is on Naprosyn. Use polyethylene glycol on as-needed basis. Continue Linaclotide 290 g by mouth every morning. Call if LUQ lump recurs. Office visit in 6 months.

## 2014-04-10 NOTE — Patient Instructions (Signed)
Remember you cannot take ibuprofen or other OTC NSAIDs while on Naprosyn. Notify if painful lump below left costal margin recurs

## 2014-04-11 ENCOUNTER — Ambulatory Visit (HOSPITAL_COMMUNITY): Payer: PPO | Admitting: Occupational Therapy

## 2014-04-11 ENCOUNTER — Encounter (HOSPITAL_COMMUNITY): Payer: Self-pay | Admitting: Occupational Therapy

## 2014-04-11 DIAGNOSIS — R531 Weakness: Secondary | ICD-10-CM

## 2014-04-11 DIAGNOSIS — M25512 Pain in left shoulder: Secondary | ICD-10-CM

## 2014-04-11 DIAGNOSIS — M2560 Stiffness of unspecified joint, not elsewhere classified: Secondary | ICD-10-CM

## 2014-04-11 NOTE — Patient Instructions (Signed)
ROM: Flexion - Wand   Bring wand directly over head, leading with right side. Reach back until stretch is felt.  Repeat __10__ times per set. Do _1___ sets per session. Do __1-2__ sessions per day.  http://orth.exer.us/744   Copyright  VHI. All rights reserved.   ROM: Abduction - Wand   Holding wand with left hand palm up, push wand directly out to side, leading with other hand palm down, until stretch is felt.  Repeat __10__ times per set. Do __1__ sets per session. Do _1-2___ sessions per day.  http://orth.exer.us/746   Copyright  VHI. All rights reserved.   ROM: Horizontal Abduction / Adduction - Wand   Keeping both palms down, push right hand across body with other hand. Then pull back across body, keeping arms parallel to floor. Do not allow trunk to twist. Repeat __10__ times per set. Do __1__ sets per session. Do 1-2___ sessions per day.  http://orth.exer.us/752   Copyright  VHI. All rights reserved.   ROM: External / Internal Rotation - Wand   Holding wand with left hand palm up, push out from body with other hand, palm down. Keep both elbows bent.  Repeat to other side, leading with same hand. Keep elbows bent. Repeat __10__ times per set. Do __1__ sets per session. Do 1-2____ sessions per day.  http://orth.exer.us/748   Copyright  VHI. All rights reserved.   
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
## 2014-04-13 ENCOUNTER — Ambulatory Visit (HOSPITAL_COMMUNITY): Payer: PPO | Admitting: Occupational Therapy

## 2014-04-13 ENCOUNTER — Encounter (HOSPITAL_COMMUNITY): Payer: Self-pay | Admitting: Occupational Therapy

## 2014-04-13 DIAGNOSIS — M2560 Stiffness of unspecified joint, not elsewhere classified: Secondary | ICD-10-CM

## 2014-04-13 DIAGNOSIS — M25512 Pain in left shoulder: Secondary | ICD-10-CM

## 2014-04-13 DIAGNOSIS — R531 Weakness: Secondary | ICD-10-CM

## 2014-04-13 NOTE — Therapy (Signed)
[facility] [place], [place], [address] Phone: [phone]   Fax:  [phone]  Occupational Therapy Treatment  Patient Details  Name: Zoe [name] MRN: [id number] Date of Birth: Dec 28, 1935 Referring Provider:  Mcarthur Rossetti*  Encounter Date: 04/13/2014      OT End of Session - 04/13/14 1347    Visit Number 6   Number of Visits 12   Date for OT Re-Evaluation 05/22/14  mini reassess 3/25   Authorization Type Medicare   Authorization Time Period before 10th visit   Authorization - Visit Number 6   Authorization - Number of Visits 10   OT Start Time 1302   OT Stop Time 1352   OT Time Calculation (min) 50 min   Activity Tolerance Patient tolerated treatment well   Behavior During Therapy [facility] for tasks assessed/performed      Past Medical History  Diagnosis Date  . GERD (gastroesophageal reflux disease)   . Chronic constipation   . Cough   . Nausea   . Hemorrhoids   . High cholesterol   . Asthma   . Environmental allergies   . Urgency of urination   . Arthritis   . Hypercholesteremia   . Hypertension   . Dysrhythmia     tachycardia  . Anxiety   . Depression   . Pneumonia     7+ YRS  . Fibromyalgia   . Cancer     skin CA  . Abdominal discomfort 11-02-12    suspected ?UTI- PCP MD placed on Cipro    Past Surgical History  Procedure Laterality Date  . Upper gastrointestinal endoscopy  08/03/2009    NUR  . Upper gastrointestinal endoscopy  03/29/2003    EGD ED TCS  . Colonoscopy  03/29/03  . Umbilical hernia repair  04/26/03    JENKINS  . Cholecystectomy    . Appendectomy    . Eye surgery      bilateral cataract removal  . Skin cancer excision      RIGHT NECK    . Tonsillectomy      T+A  . Back surgery      X2   . Total hip arthroplasty Right 09/14/2012    Procedure: RIGHT TOTAL HIP ARTHROPLASTY ANTERIOR APPROACH and RIGHT KNEE STEROID INJECTION;  Surgeon: Mcarthur Rossetti,  MD;  Location: [facility];  Service: Orthopedics;  Laterality: Right;  . Total hip arthroplasty Left 11/05/2012    Procedure: LEFT TOTAL HIP ARTHROPLASTY ANTERIOR APPROACH;  Surgeon: Mcarthur Rossetti, MD;  [facility];  Service: Orthopedics;  Laterality: Left;  . Anal rectal manometry N/A 11/28/2013    Procedure: ANO RECTAL MANOMETRY;  Surgeon: Leighton Ruff, MD;  [facility];  Service: Endoscopy;  Laterality: N/A;  . Colonoscopy N/A 12/09/2013    Procedure: COLONOSCOPY;  Surgeon: Rogene Houston, MD;  [facility];  Service: Endoscopy;  Laterality: N/A;  730    There were no vitals filed for this visit.  Visit Diagnosis:  Pain in joint, shoulder region, left  Limited joint range of motion (ROM)  Decreased strength      Subjective Assessment - 04/13/14 1401    Symptoms S: It's feeling pretty good today, even though I scrubbed my floors. My back hurts though.    Currently in Pain? Yes   Pain Score 7    Pain Location Back   Pain Orientation Left;Upper   Pain Descriptors / Indicators Aching   Pain Type  Acute pain            [facility] OT Assessment - 04/13/14 1402    Assessment   Diagnosis Left Shoulder Impingement   Precautions   Precautions None                OT Treatments/Exercises (OP) - 04/13/14 1322    Exercises   Exercises Shoulder   Shoulder Exercises: Supine   Protraction PROM;5 reps;AAROM;12 reps   Horizontal ABduction PROM;5 reps;AAROM;12 reps   External Rotation PROM;5 reps;AAROM;12 reps   Internal Rotation PROM;5 reps;AAROM;12 reps   Flexion PROM;5 reps;AAROM;12 reps   ABduction PROM;5 reps;AAROM;12 reps   Other Supine Exercises serratus anterior punches 10X   Shoulder Exercises: Seated   Protraction AAROM;12 reps   Horizontal ABduction AAROM;12 reps   External Rotation AAROM;12 reps   Internal Rotation AAROM;12 reps   Flexion AAROM;12 reps   Abduction AAROM;12 reps   Shoulder Exercises: ROM/Strengthening    Prot/Ret//Elev/Dep 1'   Modalities   Modalities Electrical Stimulation;Moist Heat   Moist Heat Therapy   Moist Heat Location Shoulder   Electrical Stimulation   Electrical Stimulation Location left shoulder   Electrical Stimulation Action interferential   Electrical Stimulation Parameters 24 volts   Electrical Stimulation Goals Pain   Manual Therapy   Manual Therapy Myofascial release   Myofascial Release MFR and manual stretching to left upper arm, scapular, shoulder region to decrease pain and restrictions and improve pain free mobility.                  OT Short Term Goals - 03/28/14 1203    OT SHORT TERM GOAL #1   Title Patient will be educated on a HEP.   Time 3   Period Weeks   Status On-going   OT SHORT TERM GOAL #2   Title Patient will improve left shoulder AROM by 10 degrees for increased ability to reach her bra strap.   Time 3   Period Weeks   Status On-going   OT SHORT TERM GOAL #3   Title Patient will decrease pain to 4/10 in left shoulder when completing daily activities.    Time 3   Period Weeks   Status On-going   OT SHORT TERM GOAL #4   Title Patient will decrease fascial restrictions to minimal in her left shoulder region.    Time 3   Period Weeks   Status On-going   OT SHORT TERM GOAL #5   Status On-going           OT Long Term Goals - 03/28/14 1203    OT LONG TERM GOAL #1   Title Patient will complete all B/IADLs without pain in her left arm, using left arm as dominant.   Time 6   Period Weeks   Status On-going   OT LONG TERM GOAL #2   Title Patient will improve left shoulder AROM to WNL for increased ability to reach behind her back and overhead.   Time 6   Period Weeks   Status On-going   OT LONG TERM GOAL #3   Title Patient will demonstrate 5/5 strength in her left shoulder for increased ability to pick up her great granddaughter.   Time 6   Period Weeks   Status On-going   OT LONG TERM GOAL #4   Title Patient will  decrease pain in her left shoulder to 2/10 or less when completing ADLs and sleeping at night.   Time 6   Period Weeks  Status On-going   OT LONG TERM GOAL #5   Title Patient will decrease fascial restrictions to trace in her left arm.    Time 6   Period Weeks   Status On-going               Plan - 04/13/14 1348    Clinical Impression Statement A: Added pro/ret/elev/dep this date. Continued AAROM exercises in supine & sitting. Pt demonstrates good form, pt husband present to observe session. Reviewed HEP with pt and husband, pt demonstrates understanding and proper form. Patient tolerated treatment well.    Plan P: Follow up on HEP. Add row/retraction (scapular theraband if able to tolerate). Provide shoulder stretches HEP.         Problem List Patient Active Problem List   Diagnosis Date Noted  . SBO (small bowel obstruction) 03/02/2013  . Hyperlipidemia 12/08/2012  . Gout 12/08/2012  . Dysrhythmia 12/08/2012  . Depression 12/08/2012  . Asthma 12/08/2012  . Fibromyalgia 12/08/2012  . Constipation 12/08/2012  . Acute posthemorrhagic anemia 12/07/2012  . Essential hypertension, benign 12/07/2012  . Degenerative arthritis of hip, left 11/05/2012  . Arthritis pain of hip 09/14/2012  . Weakness of right leg 03/24/2011  . OA (osteoarthritis) 03/24/2011  . Stiffness of cervical spine 03/24/2011  . Chronic constipation 11/19/2010  . GERD (gastroesophageal reflux disease) 11/19/2010  . SPINAL STENOSIS 04/18/2009  . HIP PAIN 11/17/2007  . DEGEN LUMBAR/LUMBOSACRAL INTERVERTEBRAL DISC 11/17/2007  . ANSERINE BURSITIS, RIGHT 08/11/2007  . SCIATICA 06/22/2007  . KNEE PAIN 03/17/2007    Guadelupe Sabin, OTR/L  [phone]  04/13/2014, 2:03 PM  [facility] [place], [place], [address] Phone: [phone]   Fax:  [phone]

## 2014-04-17 ENCOUNTER — Ambulatory Visit (HOSPITAL_COMMUNITY): Payer: PPO | Admitting: Physical Therapy

## 2014-04-17 ENCOUNTER — Encounter (HOSPITAL_COMMUNITY): Payer: Self-pay | Admitting: Physical Therapy

## 2014-04-17 DIAGNOSIS — R269 Unspecified abnormalities of gait and mobility: Secondary | ICD-10-CM

## 2014-04-17 DIAGNOSIS — M5489 Other dorsalgia: Secondary | ICD-10-CM

## 2014-04-17 DIAGNOSIS — M25512 Pain in left shoulder: Secondary | ICD-10-CM | POA: Diagnosis not present

## 2014-04-17 DIAGNOSIS — M5386 Other specified dorsopathies, lumbar region: Secondary | ICD-10-CM

## 2014-04-17 DIAGNOSIS — M6281 Muscle weakness (generalized): Secondary | ICD-10-CM

## 2014-04-17 DIAGNOSIS — M5384 Other specified dorsopathies, thoracic region: Secondary | ICD-10-CM

## 2014-04-17 NOTE — Patient Instructions (Signed)
Bridge   Lie back, legs bent. Inhale, pressing hips up. Keeping ribs in, lengthen lower back. Exhale, rolling down along spine from top. Repeat _10___ times. Do __2__ sessions per day.  Copyright  VHI. All rights reserved.   ABDUCTION: Standing (Active)   Stand, feet flat. Lift right leg out to side. Use _0__ lbs. Complete _1__ sets of _10__ repetitions. Perform _2__ sessions per day.   HIP EXCURSIONS  1) With feet shoulder width apart, shift hips forward and back 10 times  2) With feet shoulder width apart, shift hips side to side 10 times  3) With feet shoulder width apart, rotate at hips 10 times  http://gtsc.exer.us/111   Copyright  VHI. All rights reserved.
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
## 2014-04-18 ENCOUNTER — Ambulatory Visit (HOSPITAL_COMMUNITY): Payer: PPO | Admitting: Occupational Therapy

## 2014-04-18 ENCOUNTER — Encounter (HOSPITAL_COMMUNITY): Payer: PPO | Admitting: Physical Therapy

## 2014-04-18 ENCOUNTER — Encounter (HOSPITAL_COMMUNITY): Payer: Self-pay | Admitting: Occupational Therapy

## 2014-04-18 DIAGNOSIS — M25512 Pain in left shoulder: Secondary | ICD-10-CM | POA: Diagnosis not present

## 2014-04-18 DIAGNOSIS — R531 Weakness: Secondary | ICD-10-CM

## 2014-04-18 DIAGNOSIS — M2560 Stiffness of unspecified joint, not elsewhere classified: Secondary | ICD-10-CM

## 2014-04-18 NOTE — Therapy (Signed)
[place], [place], [address] Phone: [phone]   Fax:  [phone]  Occupational Therapy Treatment  Patient Details  Name: Zoe [name] MRN: [id number] Date of Birth: 1935/10/20 Referring Provider:  Mcarthur Rossetti*  Encounter Date: 04/18/2014      OT End of Session - 04/18/14 1524    Visit Number 7   Number of Visits 12   Date for OT Re-Evaluation 05/22/14   Authorization Type Medicare   Authorization Time Period before 10th visit   Authorization - Visit Number 7   Authorization - Number of Visits 10   OT Start Time 1443   OT Stop Time 1530   OT Time Calculation (min) 47 min   Activity Tolerance Patient tolerated treatment well   Behavior During Therapy [facility] for tasks assessed/performed      Past Medical History  Diagnosis Date  . GERD (gastroesophageal reflux disease)   . Chronic constipation   . Cough   . Nausea   . Hemorrhoids   . High cholesterol   . Asthma   . Environmental allergies   . Urgency of urination   . Arthritis   . Hypercholesteremia   . Hypertension   . Dysrhythmia     tachycardia  . Anxiety   . Depression   . Pneumonia     7+ YRS  . Fibromyalgia   . Cancer     skin CA  . Abdominal discomfort 11-02-12    suspected ?UTI- PCP MD placed on Cipro    Past Surgical History  Procedure Laterality Date  . Upper gastrointestinal endoscopy  08/03/2009    NUR  . Upper gastrointestinal endoscopy  03/29/2003    EGD ED TCS  . Colonoscopy  03/29/03  . Umbilical hernia repair  04/26/03    JENKINS  . Cholecystectomy    . Appendectomy    . Eye surgery      bilateral cataract removal  . Skin cancer excision      RIGHT NECK    . Tonsillectomy      T+A  . Back surgery      X2   . Total hip arthroplasty Right 09/14/2012    Procedure: RIGHT TOTAL HIP ARTHROPLASTY ANTERIOR APPROACH and RIGHT KNEE STEROID INJECTION;  Surgeon: Mcarthur Rossetti, MD;  Location: [facility];   Service: Orthopedics;  Laterality: Right;  . Total hip arthroplasty Left 11/05/2012    Procedure: LEFT TOTAL HIP ARTHROPLASTY ANTERIOR APPROACH;  Surgeon: Mcarthur Rossetti, MD;  [facility];  Service: Orthopedics;  Laterality: Left;  . Anal rectal manometry N/A 11/28/2013    Procedure: ANO RECTAL MANOMETRY;  Surgeon: Leighton Ruff, MD;  [facility];  Service: Endoscopy;  Laterality: N/A;  . Colonoscopy N/A 12/09/2013    Procedure: COLONOSCOPY;  Surgeon: Rogene Houston, MD;  [facility];  Service: Endoscopy;  Laterality: N/A;  730    There were no vitals filed for this visit.  Visit Diagnosis:  Pain in joint, shoulder region, left  Limited joint range of motion (ROM)  Decreased strength      Subjective Assessment - 04/18/14 1445    Symptoms S: The shoulder is feeling pretty good, I can lay on it now.    Currently in Pain? Yes   Pain Score 5    Pain Location Neck  left side of back   Pain Orientation Left   Pain Type Acute pain  [facility] OT Assessment - 04/18/14 1524    Assessment   Diagnosis Left Shoulder Impingement   Precautions   Precautions None                OT Treatments/Exercises (OP) - 04/18/14 1446    Exercises   Exercises Shoulder   Shoulder Exercises: Supine   Protraction PROM;5 reps;AROM;12 reps   Horizontal ABduction PROM;5 reps;AROM;12 reps   External Rotation PROM;5 reps;AROM;12 reps   Internal Rotation PROM;5 reps;AROM;12 reps   Flexion PROM;5 reps;AROM;12 reps   ABduction PROM;5 reps;AROM;12 reps   Shoulder Exercises: Standing   Protraction AROM;12 reps   Horizontal ABduction AROM;12 reps   External Rotation AROM;12 reps   Internal Rotation AROM;12 reps   Flexion AROM;12 reps   ABduction AROM;12 reps   Row AROM;10 reps   Modalities   Modalities Electrical Stimulation;Moist Heat   Moist Heat Therapy   Number Minutes Moist Heat 10 Minutes   Moist Heat Location Shoulder   Electrical  Stimulation   Electrical Stimulation Location left shoulder   Electrical Stimulation Action Interferential   Electrical Stimulation Parameters 19.0 volts   Electrical Stimulation Goals Pain   Manual Therapy   Manual Therapy Myofascial release   Myofascial Release MFR and manual stretching to left upper arm, scapular, shoulder region to decrease pain and restrictions and improve pain free mobility.                  OT Short Term Goals - 03/28/14 1203    OT SHORT TERM GOAL #1   Title Patient will be educated on a HEP.   Time 3   Period Weeks   Status On-going   OT SHORT TERM GOAL #2   Title Patient will improve left shoulder AROM by 10 degrees for increased ability to reach her bra strap.   Time 3   Period Weeks   Status On-going   OT SHORT TERM GOAL #3   Title Patient will decrease pain to 4/10 in left shoulder when completing daily activities.    Time 3   Period Weeks   Status On-going   OT SHORT TERM GOAL #4   Title Patient will decrease fascial restrictions to minimal in her left shoulder region.    Time 3   Period Weeks   Status On-going   OT SHORT TERM GOAL #5   Status On-going           OT Long Term Goals - 03/28/14 1203    OT LONG TERM GOAL #1   Title Patient will complete all B/IADLs without pain in her left arm, using left arm as dominant.   Time 6   Period Weeks   Status On-going   OT LONG TERM GOAL #2   Title Patient will improve left shoulder AROM to WNL for increased ability to reach behind her back and overhead.   Time 6   Period Weeks   Status On-going   OT LONG TERM GOAL #3   Title Patient will demonstrate 5/5 strength in her left shoulder for increased ability to pick up her great granddaughter.   Time 6   Period Weeks   Status On-going   OT LONG TERM GOAL #4   Title Patient will decrease pain in her left shoulder to 2/10 or less when completing ADLs and sleeping at night.   Time 6   Period Weeks   Status On-going   OT LONG TERM  GOAL #5   Title Patient will decrease fascial  restrictions to trace in her left arm.    Time 6   Period Weeks   Status On-going               Plan - 04/18/14 1525    Clinical Impression Statement A: Added AROM exercises in supine and standing, including row. Pt limited in pro/ret/elev/dep due to pain in cervical region. Did not use scapular theraband due to back pain. Pt demonstrates good form during exercises and reports that her shoulder is beginning to feel much better. Patient tolerated treatment well.    Plan P: Follow up on cervical pain. Provide shoulder stretches HEP; cervical AROM if appropriate.        Problem List Patient Active Problem List   Diagnosis Date Noted  . SBO (small bowel obstruction) 03/02/2013  . Hyperlipidemia 12/08/2012  . Gout 12/08/2012  . Dysrhythmia 12/08/2012  . Depression 12/08/2012  . Asthma 12/08/2012  . Fibromyalgia 12/08/2012  . Constipation 12/08/2012  . Acute posthemorrhagic anemia 12/07/2012  . Essential hypertension, benign 12/07/2012  . Degenerative arthritis of hip, left 11/05/2012  . Arthritis pain of hip 09/14/2012  . Weakness of right leg 03/24/2011  . OA (osteoarthritis) 03/24/2011  . Stiffness of cervical spine 03/24/2011  . Chronic constipation 11/19/2010  . GERD (gastroesophageal reflux disease) 11/19/2010  . SPINAL STENOSIS 04/18/2009  . HIP PAIN 11/17/2007  . DEGEN LUMBAR/LUMBOSACRAL INTERVERTEBRAL DISC 11/17/2007  . ANSERINE BURSITIS, RIGHT 08/11/2007  . SCIATICA 06/22/2007  . KNEE PAIN 03/17/2007    Guadelupe Sabin, OTR/L  [phone]  04/18/2014, 3:39 PM  [facility] [place] [address] [place], [place], [address] Phone: [phone]   Fax:  [phone]

## 2014-04-20 ENCOUNTER — Ambulatory Visit (HOSPITAL_COMMUNITY): Payer: PPO | Admitting: Occupational Therapy

## 2014-04-20 ENCOUNTER — Ambulatory Visit (HOSPITAL_COMMUNITY): Payer: PPO | Admitting: Physical Therapy

## 2014-04-20 ENCOUNTER — Encounter (HOSPITAL_COMMUNITY): Payer: Self-pay | Admitting: Occupational Therapy

## 2014-04-20 DIAGNOSIS — M25512 Pain in left shoulder: Secondary | ICD-10-CM | POA: Diagnosis not present

## 2014-04-20 DIAGNOSIS — R269 Unspecified abnormalities of gait and mobility: Secondary | ICD-10-CM

## 2014-04-20 DIAGNOSIS — M5489 Other dorsalgia: Secondary | ICD-10-CM

## 2014-04-20 DIAGNOSIS — R531 Weakness: Secondary | ICD-10-CM

## 2014-04-20 DIAGNOSIS — M5384 Other specified dorsopathies, thoracic region: Secondary | ICD-10-CM

## 2014-04-20 DIAGNOSIS — M5386 Other specified dorsopathies, lumbar region: Secondary | ICD-10-CM

## 2014-04-20 DIAGNOSIS — M6281 Muscle weakness (generalized): Secondary | ICD-10-CM

## 2014-04-20 DIAGNOSIS — M2560 Stiffness of unspecified joint, not elsewhere classified: Secondary | ICD-10-CM

## 2014-04-20 NOTE — Patient Instructions (Signed)
Strengthening: Chest Pull - Resisted   Hold Theraband in front of body with hands about shoulder width a part. Pull band a part and back together slowly. Repeat ___10_ times. Complete __1__ set(s) per session.. Repeat 1-2____ session(s) per day.  http://orth.exer.us/926   Copyright  VHI. All rights reserved.   PNF Strengthening: Resisted   Standing with resistive band around each hand, bring right arm up and away, thumb back. Repeat __10__ times per set. Do __1__ sets per session. Do _1-2___ sessions per day.  http://orth.exer.us/918   Copyright  VHI. All rights reserved.   PNF Strengthening: Resisted   Standing with resistive band around each hand, bring right arm up and across body. Repeat __10__ times per set. Do __1__ sets per session. Do _1-2___ sessions per day.  http://orth.exer.us/920   Copyright  VHI. All rights reserved.    Resisted External Rotation: in Neutral - Bilateral   Sit or stand, tubing in both hands, elbows at sides, bent to 90, forearms forward. Pinch shoulder blades together and rotate forearms out. Keep elbows at sides. Repeat _10___ times per set. Do __1__ sets per session. Do _1-2___ sessions per day.  http://orth.exer.us/966   Copyright  VHI. All rights reserved.   PNF Strengthening: Resisted   Standing, hold resistive band above head. Bring right arm down and out from side. Repeat _10___ times per set. Do _1__ sets per session. Do _1-2___ sessions per day.  http://orth.exer.us/922   Copyright  VHI. All rights reserved.  

## 2014-04-20 NOTE — Therapy (Addendum)
[place], [place], [address] Phone: [phone]   Fax:  [phone]  Occupational Therapy Reassessment & Treatment  Patient Details  Name: Zoe [name] MRN: [id number] Date of Birth: [birth year] Referring Provider:  Mcarthur Rossetti*  Encounter Date: 04/20/2014      OT End of Session - 04/20/14 1443    Visit Number 8   Number of Visits 12   Date for OT Re-Evaluation 05/22/14   Authorization Type Medicare   Authorization Time Period before 10th visit   Authorization - Visit Number 8   Authorization - Number of Visits 10   OT Start Time 2902   OT Stop Time 1347   OT Time Calculation (min) 42 min   Activity Tolerance Patient tolerated treatment well   Behavior During Therapy [facility] for tasks assessed/performed      Past Medical History  Diagnosis Date  . GERD (gastroesophageal reflux disease)   . Chronic constipation   . Cough   . Nausea   . Hemorrhoids   . High cholesterol   . Asthma   . Environmental allergies   . Urgency of urination   . Arthritis   . Hypercholesteremia   . Hypertension   . Dysrhythmia     tachycardia  . Anxiety   . Depression   . Pneumonia     7+ YRS  . Fibromyalgia   . Cancer     skin CA  . Abdominal discomfort 11-02-12    suspected ?UTI- PCP MD placed on Cipro    Past Surgical History  Procedure Laterality Date  . Upper gastrointestinal endoscopy  08/03/2009    NUR  . Upper gastrointestinal endoscopy  03/29/2003    EGD ED TCS  . Colonoscopy  03/29/03  . Umbilical hernia repair  04/26/03    JENKINS  . Cholecystectomy    . Appendectomy    . Eye surgery      bilateral cataract removal  . Skin cancer excision      RIGHT NECK    . Tonsillectomy      T+A  . Back surgery      X2   . Total hip arthroplasty Right 09/14/2012    Procedure: RIGHT TOTAL HIP ARTHROPLASTY ANTERIOR APPROACH and RIGHT KNEE STEROID INJECTION;  Surgeon: Mcarthur Rossetti, MD;   Location: [facility];  Service: Orthopedics;  Laterality: Right;  . Total hip arthroplasty Left 11/05/2012    Procedure: LEFT TOTAL HIP ARTHROPLASTY ANTERIOR APPROACH;  Surgeon: Mcarthur Rossetti, MD;  [facility];  Service: Orthopedics;  Laterality: Left;  . Anal rectal manometry N/A 11/28/2013    Procedure: ANO RECTAL MANOMETRY;  Surgeon: Leighton Ruff, MD;  [facility];  Service: Endoscopy;  Laterality: N/A;  . Colonoscopy N/A 12/09/2013    Procedure: COLONOSCOPY;  Surgeon: Rogene Houston, MD;  [facility];  Service: Endoscopy;  Laterality: N/A;  730    There were no vitals filed for this visit.  Visit Diagnosis:  Pain in joint, shoulder region, left  Limited joint range of motion (ROM)  Decreased strength      Subjective Assessment - 04/20/14 1305    Special Tests FOTO: 75/100 (25% impairment)   Currently in Pain? Yes   Pain Score 1    Pain Location Shoulder   Pain Orientation Left   Pain Descriptors / Indicators Aching   Pain Type Acute pain           [facility] OT  Assessment - 04/20/14 1321    Assessment   Diagnosis Left Shoulder Impingement   Precautions   Precautions None   AROM   Overall AROM Comments assessed in seated, ER/IR with shoulder abducted   Left Shoulder Flexion 163 Degrees  130 previous   Left Shoulder ABduction 155 Degrees  previous 70   Left Shoulder Internal Rotation 90 Degrees  previous 40   Left Shoulder External Rotation 75 Degrees  previous 74   Strength   Left Shoulder Flexion 5/5   Left Shoulder ABduction --  5-/5   Left Shoulder Internal Rotation --  5-/5   Left Shoulder External Rotation --  5-/5                  OT Treatments/Exercises (OP) - 04/20/14 1321    Exercises   Exercises Shoulder   Shoulder Exercises: Supine   Protraction PROM;5 reps;AROM;15 reps   Horizontal ABduction PROM;5 reps;AROM;15 reps   External Rotation PROM;5 reps;AROM;15 reps   Internal Rotation PROM;5 reps;AROM;15  reps   Flexion PROM;5 reps;AROM;15 reps   ABduction PROM;5 reps;AROM;15 reps   Manual Therapy   Manual Therapy Myofascial release   Myofascial Release MFR and manual stretching to left upper arm, scapular, shoulder region to decrease pain and restrictions and improve pain free mobility.               OT Education - 04/20/14 1443    Education provided Yes   Education Details theraband HEP   Person(s) Educated Patient   Methods Explanation;Demonstration;Handout   Comprehension Verbalized understanding;Returned demonstration          OT Short Term Goals - 04/20/14 1329    OT SHORT TERM GOAL #1   Title Patient will be educated on a HEP.   Time 3   Period Weeks   Status Achieved   OT SHORT TERM GOAL #2   Title Patient will improve left shoulder AROM by 10 degrees for increased ability to reach her bra strap.   Time 3   Period Weeks   Status Achieved   OT SHORT TERM GOAL #3   Title Patient will decrease pain to 4/10 in left shoulder when completing daily activities.    Time 3   Period Weeks   Status Achieved   OT SHORT TERM GOAL #4   Title Patient will decrease fascial restrictions to minimal in her left shoulder region.    Time 3   Period Weeks   Status Achieved           OT Long Term Goals - 04/20/14 1330    OT LONG TERM GOAL #1   Title Patient will complete all B/IADLs without pain in her left arm, using left arm as dominant.   Time 6   Period Weeks   Status Achieved   OT LONG TERM GOAL #2   Title Patient will improve left shoulder AROM to WNL for increased ability to reach behind her back and overhead.   Time 6   Period Weeks   Status Not Met   OT LONG TERM GOAL #3   Title Patient will demonstrate 5/5 strength in her left shoulder for increased ability to pick up her great granddaughter.   Time 6   Period Weeks   Status Partially Met   OT LONG TERM GOAL #4   Title Patient will decrease pain in her left shoulder to 2/10 or less when completing ADLs  and sleeping at night.   Time 6  Period Weeks   Status Achieved   OT LONG TERM GOAL #5   Title Patient will decrease fascial restrictions to trace in her left arm.    Time 6   Period Weeks   Status Not Met               Plan - 29-Apr-2014 1444    Clinical Impression Statement A: Mini-reassessment completed this date. Pt has met 4/4 short-term goals, 2/5 long-term goals, and has partially met an additional 1/5 long-term goal. Pt reports she is now sleeping at night with no pain, is able to use her left arm as her dominant UE, and has returned to using her LUE as she usually would. Pt is able to complete all B/IADLs and only has intermittant pain when performing strenuous activity. Pt was provided with and educated on theraband HEP and will continue current HEPs as well.  FOTO score: 75/100 (25% impairment). Pt is agreeable with discharge.    Plan P: D/C patient.           G-Codes - 04-29-14 1448    Functional Assessment Tool Used clinical observation   Functional Limitation Carrying, moving and handling objects   Carrying, Moving and Handling Objects Current Status [phone]) At least 1 percent but less than 20 percent impaired, limited or restricted   Carrying, Moving and Handling Objects Goal Status ([id number]) 0 percent impaired, limited or restricted   Carrying, Moving and Handling Objects Discharge Status [id number]) At least 1 percent but less than 20 percent impaired, limited or restricted      Problem List Patient Active Problem List   Diagnosis Date Noted  . SBO (small bowel obstruction) 03/02/2013  . Hyperlipidemia 12/08/2012  . Gout 12/08/2012  . Dysrhythmia 12/08/2012  . Depression 12/08/2012  . Asthma 12/08/2012  . Fibromyalgia 12/08/2012  . Constipation 12/08/2012  . Acute posthemorrhagic anemia 12/07/2012  . Essential hypertension, benign 12/07/2012  . Degenerative arthritis of hip, left 11/05/2012  . Arthritis pain of hip 09/14/2012  . Weakness of right leg  03/24/2011  . OA (osteoarthritis) 03/24/2011  . Stiffness of cervical spine 03/24/2011  . Chronic constipation 11/19/2010  . GERD (gastroesophageal reflux disease) 11/19/2010  . SPINAL STENOSIS 04/18/2009  . HIP PAIN 11/17/2007  . DEGEN LUMBAR/LUMBOSACRAL INTERVERTEBRAL DISC 11/17/2007  . ANSERINE BURSITIS, RIGHT 08/11/2007  . SCIATICA 06/22/2007  . KNEE PAIN 03/17/2007    Guadelupe Sabin, OTR/L  [phone]  04/29/2014, 3:05 PM  [facility] [address] [place], [place], [address] Phone: [phone]   Fax:  [phone]     OCCUPATIONAL THERAPY DISCHARGE SUMMARY  Visits from Start of Care: 8  Current functional level related to goals / functional outcomes: Pt has met all short-term goals, 2/5 long-term goals, and an additional 1/5 long-term goal. Pt reports she is able to use her LUE as she usually would during B/IADL tasks, with minimal intermittent pain.  Pt is pleased with progress and is agreeable to discharge.      AROM  Left Shoulder Flexion 163 Degrees  Left Shoulder ABduction 155 Degrees  Left Shoulder Internal Rotation 90 Degrees  Left Shoulder External Rotation 75 Degrees     Remaining deficits: Pt has some intermittent pain during B/IADLs when she is completing strenuous activity or is using her LUE for prolonged periods of time.    Education / Equipment: Pt provided with and educated on red theraband HEP, reviewed current AROM HEP for pt to continue post-discharge.  Plan: Patient  agrees to discharge.  Patient goals were partially met. Patient is being discharged due to being pleased with the current functional level.  ?????
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
## 2014-04-25 ENCOUNTER — Ambulatory Visit (HOSPITAL_COMMUNITY): Payer: PPO | Admitting: Physical Therapy

## 2014-04-25 ENCOUNTER — Encounter (HOSPITAL_COMMUNITY): Payer: PPO | Admitting: Occupational Therapy

## 2014-04-27 ENCOUNTER — Ambulatory Visit (HOSPITAL_COMMUNITY): Payer: PPO | Admitting: Physical Therapy

## 2014-04-27 ENCOUNTER — Encounter (HOSPITAL_COMMUNITY): Payer: PPO | Admitting: Occupational Therapy

## 2014-04-27 DIAGNOSIS — M6281 Muscle weakness (generalized): Secondary | ICD-10-CM

## 2014-04-27 DIAGNOSIS — M25512 Pain in left shoulder: Secondary | ICD-10-CM | POA: Diagnosis not present

## 2014-04-27 DIAGNOSIS — M5384 Other specified dorsopathies, thoracic region: Secondary | ICD-10-CM

## 2014-04-27 DIAGNOSIS — M5386 Other specified dorsopathies, lumbar region: Secondary | ICD-10-CM

## 2014-04-27 DIAGNOSIS — M5489 Other dorsalgia: Secondary | ICD-10-CM

## 2014-04-27 DIAGNOSIS — R269 Unspecified abnormalities of gait and mobility: Secondary | ICD-10-CM
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
## 2014-05-02 ENCOUNTER — Encounter (HOSPITAL_COMMUNITY): Payer: PPO | Admitting: Physical Therapy

## 2014-05-03 ENCOUNTER — Encounter (HOSPITAL_COMMUNITY): Payer: Medicare Other | Admitting: Physical Therapy

## 2014-05-04 ENCOUNTER — Encounter (HOSPITAL_COMMUNITY): Payer: PPO

## 2014-05-05 ENCOUNTER — Ambulatory Visit (HOSPITAL_COMMUNITY): Payer: PPO | Attending: Orthopaedic Surgery

## 2014-05-05 DIAGNOSIS — M25612 Stiffness of left shoulder, not elsewhere classified: Secondary | ICD-10-CM | POA: Insufficient documentation

## 2014-05-05 DIAGNOSIS — M5386 Other specified dorsopathies, lumbar region: Secondary | ICD-10-CM

## 2014-05-05 DIAGNOSIS — M25512 Pain in left shoulder: Secondary | ICD-10-CM

## 2014-05-05 DIAGNOSIS — M6281 Muscle weakness (generalized): Secondary | ICD-10-CM

## 2014-05-05 DIAGNOSIS — M5384 Other specified dorsopathies, thoracic region: Secondary | ICD-10-CM

## 2014-05-05 DIAGNOSIS — R531 Weakness: Secondary | ICD-10-CM

## 2014-05-05 DIAGNOSIS — M2560 Stiffness of unspecified joint, not elsewhere classified: Secondary | ICD-10-CM

## 2014-05-05 DIAGNOSIS — M62838 Other muscle spasm: Secondary | ICD-10-CM | POA: Insufficient documentation

## 2014-05-05 DIAGNOSIS — R269 Unspecified abnormalities of gait and mobility: Secondary | ICD-10-CM

## 2014-05-05 DIAGNOSIS — M5489 Other dorsalgia: Secondary | ICD-10-CM
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
## 2014-05-08 NOTE — Addendum Note (Signed)
Addended by: [name] Oris on: 05/08/2014 03:56 PM   Modules accepted: Orders

## 2014-05-09 ENCOUNTER — Telehealth (HOSPITAL_COMMUNITY): Payer: Self-pay | Admitting: Physical Therapy

## 2014-05-09 ENCOUNTER — Telehealth (HOSPITAL_COMMUNITY): Payer: Self-pay

## 2014-05-09 ENCOUNTER — Ambulatory Visit (HOSPITAL_COMMUNITY): Payer: PPO | Admitting: Physical Therapy

## 2014-05-09 NOTE — Telephone Encounter (Signed)
4/12 called to say that she had left a message that she cancelled today's appt

## 2014-05-11 ENCOUNTER — Ambulatory Visit (HOSPITAL_COMMUNITY): Payer: PPO

## 2014-05-11 DIAGNOSIS — M6281 Muscle weakness (generalized): Secondary | ICD-10-CM

## 2014-05-11 DIAGNOSIS — M5489 Other dorsalgia: Secondary | ICD-10-CM

## 2014-05-11 DIAGNOSIS — M25512 Pain in left shoulder: Secondary | ICD-10-CM | POA: Diagnosis not present

## 2014-05-11 DIAGNOSIS — M5386 Other specified dorsopathies, lumbar region: Secondary | ICD-10-CM

## 2014-05-11 DIAGNOSIS — R269 Unspecified abnormalities of gait and mobility: Secondary | ICD-10-CM

## 2014-05-11 DIAGNOSIS — M5384 Other specified dorsopathies, thoracic region: Secondary | ICD-10-CM

## 2014-05-11 DIAGNOSIS — R531 Weakness: Secondary | ICD-10-CM
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
## 2014-05-16 ENCOUNTER — Ambulatory Visit (HOSPITAL_COMMUNITY): Payer: PPO | Admitting: Physical Therapy

## 2014-05-16 DIAGNOSIS — M5489 Other dorsalgia: Secondary | ICD-10-CM

## 2014-05-16 DIAGNOSIS — M5384 Other specified dorsopathies, thoracic region: Secondary | ICD-10-CM

## 2014-05-16 DIAGNOSIS — M6281 Muscle weakness (generalized): Secondary | ICD-10-CM

## 2014-05-16 DIAGNOSIS — M5386 Other specified dorsopathies, lumbar region: Secondary | ICD-10-CM

## 2014-05-16 DIAGNOSIS — M25512 Pain in left shoulder: Secondary | ICD-10-CM | POA: Diagnosis not present

## 2014-05-16 DIAGNOSIS — R531 Weakness: Secondary | ICD-10-CM

## 2014-05-16 DIAGNOSIS — R269 Unspecified abnormalities of gait and mobility: Secondary | ICD-10-CM
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
## 2014-05-18 ENCOUNTER — Ambulatory Visit (HOSPITAL_COMMUNITY): Payer: PPO | Admitting: Physical Therapy

## 2014-05-18 DIAGNOSIS — R269 Unspecified abnormalities of gait and mobility: Secondary | ICD-10-CM

## 2014-05-18 DIAGNOSIS — M25512 Pain in left shoulder: Secondary | ICD-10-CM | POA: Diagnosis not present

## 2014-05-18 DIAGNOSIS — M5489 Other dorsalgia: Secondary | ICD-10-CM

## 2014-05-18 DIAGNOSIS — M6281 Muscle weakness (generalized): Secondary | ICD-10-CM

## 2014-05-18 DIAGNOSIS — R531 Weakness: Secondary | ICD-10-CM

## 2014-05-18 DIAGNOSIS — M5384 Other specified dorsopathies, thoracic region: Secondary | ICD-10-CM

## 2014-05-18 DIAGNOSIS — M5386 Other specified dorsopathies, lumbar region: Secondary | ICD-10-CM
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
## 2014-05-22 ENCOUNTER — Other Ambulatory Visit: Payer: Self-pay | Admitting: Physician Assistant

## 2014-05-22 ENCOUNTER — Encounter (HOSPITAL_COMMUNITY): Payer: PPO | Admitting: Physical Therapy

## 2014-05-22 DIAGNOSIS — M545 Low back pain: Secondary | ICD-10-CM

## 2014-05-23 ENCOUNTER — Ambulatory Visit (INDEPENDENT_AMBULATORY_CARE_PROVIDER_SITE_OTHER): Payer: Medicare Other | Admitting: Internal Medicine

## 2014-05-23 ENCOUNTER — Encounter (HOSPITAL_COMMUNITY): Payer: PPO | Admitting: Physical Therapy

## 2014-05-25 ENCOUNTER — Telehealth (HOSPITAL_COMMUNITY): Payer: Self-pay | Admitting: Physical Therapy

## 2014-05-25 ENCOUNTER — Ambulatory Visit (HOSPITAL_COMMUNITY): Payer: PPO | Admitting: Physical Therapy

## 2014-05-25 DIAGNOSIS — M5489 Other dorsalgia: Secondary | ICD-10-CM

## 2014-05-25 DIAGNOSIS — M6281 Muscle weakness (generalized): Secondary | ICD-10-CM

## 2014-05-25 DIAGNOSIS — R269 Unspecified abnormalities of gait and mobility: Secondary | ICD-10-CM

## 2014-05-25 DIAGNOSIS — M5384 Other specified dorsopathies, thoracic region: Secondary | ICD-10-CM

## 2014-05-25 DIAGNOSIS — M25512 Pain in left shoulder: Secondary | ICD-10-CM | POA: Diagnosis not present

## 2014-05-25 DIAGNOSIS — M5386 Other specified dorsopathies, lumbar region: Secondary | ICD-10-CM

## 2014-05-25 DIAGNOSIS — R531 Weakness: Secondary | ICD-10-CM

## 2014-05-25 NOTE — Patient Instructions (Signed)
AROM: Neck Flexion and Extension    Bend head forward, then look up towards.  Repeat __10__ times per set. Do _1___ sets per session. Do __2__ sessions per day.  http://orth.exer.us/299   Copyright  VHI. All rights reserved.   AROM: Neck Rotation   Turn head slowly to look over one shoulder, then the other.  Repeat __10__ times per set. Do __1__ sets per session. Do _2___ sessions per day.  http://[email or web address].exer.us/295   AROM: Lateral Neck Flexion   Slowly tilt head toward one shoulder, then the other.  Repeat _10___ times per set. Do __1__ sets per session. Do _2___ sessions per day.  http://orth.exer.us/297   Copyright  VHI. All rights reserved.   Copyright  VHI. All rights reserved.   THORACIC EXCURSIONS IN SITTING  1) Hunch and then extend back as far as you can x10 2) Rotate as far as you can 1x10 3) Side-bend as far as you can 1x10   Flexibility: Upper Trapezius Stretch   Do NOT put your hand on your head to do this stretch. Grab the chair beside you with one hand, then  tilt head away from and rotate your head towards the hand that is grabbing the chair until a gentle stretch is felt. Hold __30__ seconds. Repeat __3__ times per set. Do __1__ sets per session. Do __2__ sessions per day.  http://orth.exer.us/341   Copyright  VHI. All rights reserved.   Neck: Retraction   Sit with back and head straight. Pull chin back to line up ear with shoulder. Do not turn or tilt head.  Hold _2-3___ seconds. Repeat __10__ times. Do __2__ sessions per day. CAUTION: Movement should be gentle, steady and slow.  Copyright  VHI. All rights reserved.   Flexibility: Corner Stretch   Standing in corner with hands just above shoulder level and feet approximately  __10-12__ inches from corner, lean forward until a comfortable stretch is felt across chest. Hold __30__ seconds. Repeat _3___ times per set. Do __1__ sets per session. Do __2__ sessions per  day.  http://orth.exer.us/343   Copyright  VHI. All rights reserved.   Scapular Retraction (Standing)   With arms at sides, pinch shoulder blades together. Repeat __15__ times per set. Do __1__ sets per session. Do ___2_ sessions per day.  http://orth.exer.us/945    SHOULDER ROLLS - sitting up straight in a chair, slowly roll shoulders back in full range of motion; do 15 times - sitting up straight in a chair, slowly roll shoulders forward in full range of motion; do 15 times   Push-Up, Wall   Inhaling, bend elbows and bring chest toward the wall. Exhaling, straighten arms. Repeat _10__ times. Do _2__ times per day.  Copyright  VHI. All rights reserved.   Copyright  VHI. All rights reserved.

## 2014-05-25 NOTE — Telephone Encounter (Signed)
Patient requested that PT send all reports to Dr. Jean Rosenthal past and present
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
## 2014-05-30 ENCOUNTER — Encounter (HOSPITAL_COMMUNITY): Payer: Medicare Other | Admitting: Physical Therapy

## 2014-06-01 ENCOUNTER — Encounter (HOSPITAL_COMMUNITY): Payer: PPO | Admitting: Physical Therapy

## 2014-06-06 ENCOUNTER — Encounter (HOSPITAL_COMMUNITY): Payer: PPO | Admitting: Physical Therapy

## 2014-06-08 ENCOUNTER — Encounter (HOSPITAL_COMMUNITY): Payer: PPO | Admitting: Physical Therapy

## 2014-06-13 ENCOUNTER — Encounter (HOSPITAL_COMMUNITY): Payer: PPO | Admitting: Physical Therapy

## 2014-06-15 ENCOUNTER — Encounter (HOSPITAL_COMMUNITY): Payer: PPO | Admitting: Physical Therapy

## 2014-07-03 ENCOUNTER — Other Ambulatory Visit: Payer: Self-pay | Admitting: Orthopaedic Surgery

## 2014-07-03 DIAGNOSIS — M542 Cervicalgia: Secondary | ICD-10-CM

## 2014-07-04 ENCOUNTER — Telehealth (INDEPENDENT_AMBULATORY_CARE_PROVIDER_SITE_OTHER): Payer: Self-pay | Admitting: *Deleted

## 2014-07-04 NOTE — Telephone Encounter (Signed)
Requesting samples of linzess.  They are very expensive.

## 2014-07-05 NOTE — Telephone Encounter (Signed)
Samples x 3  Boxes to front desk

## 2014-08-04 ENCOUNTER — Other Ambulatory Visit (HOSPITAL_COMMUNITY): Payer: Self-pay | Admitting: Pulmonary Disease

## 2014-08-04 ENCOUNTER — Ambulatory Visit (HOSPITAL_COMMUNITY)
Admission: RE | Admit: 2014-08-04 | Discharge: 2014-08-04 | Disposition: A | Discharge status: Home / Self Care | Payer: PPO | Source: Ambulatory Visit | Attending: Pulmonary Disease | Admitting: Pulmonary Disease

## 2014-08-04 DIAGNOSIS — R05 Cough: Secondary | ICD-10-CM | POA: Insufficient documentation

## 2014-08-04 DIAGNOSIS — R059 Cough, unspecified: Secondary | ICD-10-CM

## 2014-08-17 ENCOUNTER — Encounter (INDEPENDENT_AMBULATORY_CARE_PROVIDER_SITE_OTHER): Payer: Self-pay | Admitting: *Deleted

## 2014-08-22 ENCOUNTER — Telehealth (INDEPENDENT_AMBULATORY_CARE_PROVIDER_SITE_OTHER): Payer: Self-pay | Admitting: *Deleted

## 2014-08-22 NOTE — Telephone Encounter (Signed)
Breelyn would like to see if she can get any samples of Linzess 290. Her return phone number is [phone].

## 2014-08-23 NOTE — Telephone Encounter (Signed)
Samples to front

## 2014-10-04 ENCOUNTER — Emergency Department (HOSPITAL_COMMUNITY): Payer: PPO

## 2014-10-04 ENCOUNTER — Encounter (HOSPITAL_COMMUNITY): Payer: Self-pay | Admitting: *Deleted

## 2014-10-04 ENCOUNTER — Emergency Department (HOSPITAL_COMMUNITY)
Admission: EM | Admit: 2014-10-04 | Discharge: 2014-10-04 | Disposition: A | Discharge status: Home / Self Care | Payer: PPO | Attending: Emergency Medicine | Admitting: Emergency Medicine

## 2014-10-04 DIAGNOSIS — Z791 Long term (current) use of non-steroidal anti-inflammatories (NSAID): Secondary | ICD-10-CM | POA: Diagnosis not present

## 2014-10-04 DIAGNOSIS — Z9049 Acquired absence of other specified parts of digestive tract: Secondary | ICD-10-CM | POA: Insufficient documentation

## 2014-10-04 DIAGNOSIS — Z9889 Other specified postprocedural states: Secondary | ICD-10-CM | POA: Insufficient documentation

## 2014-10-04 DIAGNOSIS — F419 Anxiety disorder, unspecified: Secondary | ICD-10-CM | POA: Diagnosis not present

## 2014-10-04 DIAGNOSIS — M797 Fibromyalgia: Secondary | ICD-10-CM | POA: Insufficient documentation

## 2014-10-04 DIAGNOSIS — R109 Unspecified abdominal pain: Secondary | ICD-10-CM

## 2014-10-04 DIAGNOSIS — Z8701 Personal history of pneumonia (recurrent): Secondary | ICD-10-CM | POA: Insufficient documentation

## 2014-10-04 DIAGNOSIS — I10 Essential (primary) hypertension: Secondary | ICD-10-CM | POA: Diagnosis not present

## 2014-10-04 DIAGNOSIS — Z79899 Other long term (current) drug therapy: Secondary | ICD-10-CM | POA: Insufficient documentation

## 2014-10-04 DIAGNOSIS — M1990 Unspecified osteoarthritis, unspecified site: Secondary | ICD-10-CM | POA: Insufficient documentation

## 2014-10-04 DIAGNOSIS — R112 Nausea with vomiting, unspecified: Secondary | ICD-10-CM | POA: Diagnosis present

## 2014-10-04 DIAGNOSIS — R531 Weakness: Secondary | ICD-10-CM | POA: Diagnosis not present

## 2014-10-04 DIAGNOSIS — Z7982 Long term (current) use of aspirin: Secondary | ICD-10-CM | POA: Diagnosis not present

## 2014-10-04 DIAGNOSIS — Z85828 Personal history of other malignant neoplasm of skin: Secondary | ICD-10-CM | POA: Diagnosis not present

## 2014-10-04 DIAGNOSIS — F329 Major depressive disorder, single episode, unspecified: Secondary | ICD-10-CM | POA: Insufficient documentation

## 2014-10-04 DIAGNOSIS — E780 Pure hypercholesterolemia: Secondary | ICD-10-CM | POA: Diagnosis not present

## 2014-10-04 DIAGNOSIS — K219 Gastro-esophageal reflux disease without esophagitis: Secondary | ICD-10-CM | POA: Diagnosis not present

## 2014-10-04 DIAGNOSIS — Z720 Tobacco use: Secondary | ICD-10-CM | POA: Diagnosis not present

## 2014-10-04 DIAGNOSIS — J45901 Unspecified asthma with (acute) exacerbation: Secondary | ICD-10-CM | POA: Insufficient documentation

## 2014-10-04 DIAGNOSIS — R197 Diarrhea, unspecified: Secondary | ICD-10-CM | POA: Insufficient documentation

## 2014-10-04 LAB — URINALYSIS, ROUTINE W REFLEX MICROSCOPIC
Bilirubin Urine: NEGATIVE
Glucose, UA: NEGATIVE mg/dL
Hgb urine dipstick: NEGATIVE
Ketones, ur: NEGATIVE mg/dL
Leukocytes, UA: NEGATIVE
Nitrite: NEGATIVE
Protein, ur: NEGATIVE mg/dL
Specific Gravity, Urine: 1.01 (ref 1.005–1.030)
Urobilinogen, UA: 0.2 mg/dL (ref 0.0–1.0)
pH: 5.5 (ref 5.0–8.0)

## 2014-10-04 LAB — CBC WITH DIFFERENTIAL/PLATELET
Basophils Absolute: 0 10*3/uL (ref 0.0–0.1)
Basophils Relative: 0 % (ref 0–1)
Eosinophils Absolute: 0.2 10*3/uL (ref 0.0–0.7)
Eosinophils Relative: 3 % (ref 0–5)
HCT: 43.4 % (ref 36.0–46.0)
Hemoglobin: 14.9 g/dL (ref 12.0–15.0)
Lymphocytes Relative: 10 % — ABNORMAL LOW (ref 12–46)
Lymphs Abs: 0.6 10*3/uL — ABNORMAL LOW (ref 0.7–4.0)
MCH: 34 pg (ref 26.0–34.0)
MCHC: 34.3 g/dL (ref 30.0–36.0)
MCV: 99.1 fL (ref 78.0–100.0)
Monocytes Absolute: 0.9 10*3/uL (ref 0.1–1.0)
Monocytes Relative: 14 % — ABNORMAL HIGH (ref 3–12)
Neutro Abs: 4.6 10*3/uL (ref 1.7–7.7)
Neutrophils Relative %: 74 % (ref 43–77)
Platelets: 150 10*3/uL (ref 150–400)
RBC: 4.38 MIL/uL (ref 3.87–5.11)
RDW: 13.9 % (ref 11.5–15.5)
WBC: 6.2 10*3/uL (ref 4.0–10.5)

## 2014-10-04 LAB — I-STAT CG4 LACTIC ACID, ED: Lactic Acid, Venous: 3.64 mmol/L (ref 0.5–2.0)

## 2014-10-04 LAB — COMPREHENSIVE METABOLIC PANEL
ALT: 17 U/L (ref 14–54)
AST: 25 U/L (ref 15–41)
Albumin: 4.1 g/dL (ref 3.5–5.0)
Alkaline Phosphatase: 50 U/L (ref 38–126)
Anion gap: 10 (ref 5–15)
BUN: 22 mg/dL — ABNORMAL HIGH (ref 6–20)
CO2: 23 mmol/L (ref 22–32)
Calcium: 8.7 mg/dL — ABNORMAL LOW (ref 8.9–10.3)
Chloride: 106 mmol/L (ref 101–111)
Creatinine, Ser: 0.77 mg/dL (ref 0.44–1.00)
GFR calc Af Amer: >60 mL/min (ref >60)
GFR calc non Af Amer: >60 mL/min (ref >60)
Glucose, Bld: 137 mg/dL — ABNORMAL HIGH (ref 65–99)
Potassium: 3.9 mmol/L (ref 3.5–5.1)
Sodium: 139 mmol/L (ref 135–145)
Total Bilirubin: 0.9 mg/dL (ref 0.3–1.2)
Total Protein: 7.1 g/dL (ref 6.5–8.1)

## 2014-10-04 LAB — LIPASE, BLOOD: Lipase: 13 U/L — ABNORMAL LOW (ref 22–51)

## 2014-10-04 MED ORDER — ACETAMINOPHEN 325 MG PO TABS
650.0000 mg | ORAL_TABLET | Freq: Once | ORAL | Status: AC
  Administered 2014-10-04: 650 mg via ORAL
  Filled 2014-10-04: qty 2

## 2014-10-04 MED ORDER — ONDANSETRON HCL 4 MG PO TABS
4.0000 mg | ORAL_TABLET | Freq: Four times a day (QID) | ORAL | Status: DC
Start: 2014-10-04 — End: 2014-10-31

## 2014-10-04 MED ORDER — ONDANSETRON HCL 4 MG/2ML IJ SOLN
4.0000 mg | Freq: Once | INTRAMUSCULAR | Status: AC
  Administered 2014-10-04: 4 mg via INTRAVENOUS
  Filled 2014-10-04: qty 2

## 2014-10-04 MED ORDER — IOHEXOL 300 MG/ML  SOLN: 100.0000 mL | Freq: Once | INTRAMUSCULAR | Status: DC | PRN

## 2014-10-04 MED ORDER — SODIUM CHLORIDE 0.9 % IV BOLUS (SEPSIS)
500.0000 mL | Freq: Once | INTRAVENOUS | Status: AC
  Administered 2014-10-04: 500 mL via INTRAVENOUS

## 2014-10-04 MED ORDER — IOHEXOL 300 MG/ML  SOLN
50.0000 mL | Freq: Once | INTRAMUSCULAR | Status: AC | PRN
  Administered 2014-10-04: 50 mL via ORAL

## 2014-10-04 NOTE — ED Notes (Signed)
havent' been feeling well since labor day. Pt states abdominal pressure, vomiting, and loose stools. Hx of constipation and intestinal blockage.

## 2014-10-04 NOTE — Discharge Instructions (Signed)

## 2014-10-04 NOTE — ED Notes (Signed)
Warm blanket given. Lab at bedside.

## 2014-10-04 NOTE — ED Provider Notes (Signed)
CSN: [id number]     Arrival date & time 10/04/14  7915 History  This chart was scribed for No att. providers found by Terressa Koyanagi, ED Scribe. This patient was seen in room APA10/[facility]10 and the patient's care was started at 10:18 AM.   Chief Complaint  Patient presents with  . Emesis   The history is provided by the patient. No language interpreter was used.   PCP: Alonza Bogus, MD HPI Comments: Zoe [name] is a 79 y.o. female, with PMHx noted below including intestinal blockage (approximately 3 years ago) and compaction (approximately one year ago), who presents to the Emergency Department with multiple complaints. First, pt complains of worsening fatigue onset 3 days ago. Second, pt complains of abd distension with associated abd pressure, n/v onset last night after pt ingested soup for dinner. Third, pt complains of worsening weakness with associated SOB onset yesterday. Last, pt complains of some loose stool this morning. Pt notes her current Sx are consistent with those experienced with intestinal blockage Dx 3 years ago. Pt denies fever, blood in stool, or blood in vomit.    Past Medical History  Diagnosis Date  . GERD (gastroesophageal reflux disease)   . Chronic constipation   . Cough   . Nausea   . Hemorrhoids   . High cholesterol   . Asthma   . Environmental allergies   . Urgency of urination   . Arthritis   . Hypercholesteremia   . Hypertension   . Dysrhythmia     tachycardia  . Anxiety   . Depression   . Pneumonia     7+ YRS  . Fibromyalgia   . Cancer     skin CA  . Abdominal discomfort 11-02-12    suspected ?UTI- PCP MD placed on Cipro   Past Surgical History  Procedure Laterality Date  . Upper gastrointestinal endoscopy  08/03/2009    NUR  . Upper gastrointestinal endoscopy  03/29/2003    EGD ED TCS  . Colonoscopy  03/29/03  . Umbilical hernia repair  04/26/03    JENKINS  . Cholecystectomy    . Appendectomy    . Eye surgery      bilateral  cataract removal  . Skin cancer excision      RIGHT NECK    . Tonsillectomy      T+A  . Back surgery      X2   . Total hip arthroplasty Right 09/14/2012    Procedure: RIGHT TOTAL HIP ARTHROPLASTY ANTERIOR APPROACH and RIGHT KNEE STEROID INJECTION;  Surgeon: Mcarthur Rossetti, MD;  Location: [facility];  Service: Orthopedics;  Laterality: Right;  . Total hip arthroplasty Left 11/05/2012    Procedure: LEFT TOTAL HIP ARTHROPLASTY ANTERIOR APPROACH;  Surgeon: Mcarthur Rossetti, MD;  [facility];  Service: Orthopedics;  Laterality: Left;  . Anal rectal manometry N/A 11/28/2013    Procedure: ANO RECTAL MANOMETRY;  Surgeon: Leighton Ruff, MD;  [facility];  Service: Endoscopy;  Laterality: N/A;  . Colonoscopy N/A 12/09/2013    Procedure: COLONOSCOPY;  Surgeon: Rogene Houston, MD;  [facility];  Service: Endoscopy;  Laterality: N/A;  730   Family History  Problem Relation Age of Onset  . Healthy Son   . Healthy Son   . Healthy Daughter    Social History  Substance Use Topics  . Smoking status: Current Some Day Smoker -- 0.25 packs/day for 50 years    Types: Cigarettes  . Smokeless tobacco: Never Used  Comment: Hasn't smoked in 5-6 weeks  . Alcohol Use: 0.0 oz/week    0 Standard drinks or equivalent per week     Comment: rare   OB History    No data available     Review of Systems  Constitutional: Positive for fatigue. Negative for fever and chills.  Respiratory: Positive for shortness of breath.   Gastrointestinal: Positive for nausea, vomiting, abdominal pain and diarrhea. Negative for blood in stool.  Neurological: Positive for weakness.  All other systems reviewed and are negative.   Allergies  Codeine; Other; and Oxycodone  Home Medications   Prior to Admission medications   Medication Sig Start Date End Date Taking? Authorizing Provider  albuterol (PROVENTIL HFA;VENTOLIN HFA) 108 (90 BASE) MCG/ACT inhaler Inhale 2 puffs into the lungs  every 6 (six) hours as needed for wheezing or shortness of breath.   Yes Historical Provider, MD  albuterol (PROVENTIL) (2.5 MG/3ML) 0.083% nebulizer solution Take 2.5 mg by nebulization 2 (two) times daily as needed for wheezing or shortness of breath.   Yes Historical Provider, MD  allopurinol (ZYLOPRIM) 300 MG tablet Take 300 mg by mouth daily. 03/10/11  Yes Historical Provider, MD  ALPRAZolam Duanne Moron) 0.5 MG tablet Take 0.5 mg by mouth as needed for anxiety.    Yes Historical Provider, MD  amLODipine (NORVASC) 2.5 MG tablet Take 2.5 mg by mouth every other day. In the am   Yes Historical Provider, MD  aspirin 81 MG tablet Take 81 mg by mouth daily.   Yes Historical Provider, MD  Biotin 1000 MCG tablet Take 1,000 mcg by mouth every morning.   Yes Historical Provider, MD  digoxin (LANOXIN) 0.25 MG tablet Take 0.125 mg by mouth daily.    Yes Historical Provider, MD  doxepin (SINEQUAN) 100 MG capsule Take 100 mg by mouth at bedtime.    Yes Historical Provider, MD  fexofenadine (ALLEGRA) 60 MG tablet Take 30 mg by mouth at bedtime.    Yes Historical Provider, MD  gabapentin (NEURONTIN) 300 MG capsule Take 300 mg by mouth 2 (two) times daily.    Yes Historical Provider, MD  Linaclotide (LINZESS) 290 MCG CAPS capsule Take 1 capsule (290 mcg total) by mouth daily. 02/21/14  Yes Rogene Houston, MD  naproxen (NAPROSYN) 500 MG tablet Take 500 mg by mouth 2 (two) times daily.    Yes Historical Provider, MD  omeprazole (PRILOSEC) 20 MG capsule Take 20 mg by mouth every morning.   Yes Historical Provider, MD  pravastatin (PRAVACHOL) 20 MG tablet Take 20 mg by mouth every other day.    Yes Historical Provider, MD  quiNINE (QUALAQUIN) 324 MG capsule Take 150-300 mg by mouth at bedtime. Patient states that she takes 150 mg by mouth one night , then the next night she takes 300 mg by mouth. Alternates.   Yes Historical Provider, MD  senna (SENOKOT) 8.6 MG tablet Take 5 tablets by mouth 2 (two) times daily.   Yes  Historical Provider, MD  ondansetron (ZOFRAN) 4 MG tablet Take 1 tablet (4 mg total) by mouth every 6 (six) hours. 10/04/14   Orpah Greek, MD   Triage Vitals: BP 124/80 mmHg  Pulse 89  Temp(Src) 97.7 F (36.5 C) (Oral)  Resp 16  Ht 5\' 7"  (1.702 m)  Wt 170 lb (77.111 kg)  BMI 26.62 kg/m2  SpO2 97% Physical Exam  Constitutional: She is oriented to person, place, and time. She appears well-developed and well-nourished. No distress.  HENT:  Head:  Normocephalic and atraumatic.  Right Ear: Hearing normal.  Left Ear: Hearing normal.  Nose: Nose normal.  Mouth/Throat: Oropharynx is clear and moist and mucous membranes are normal.  Eyes: Conjunctivae and EOM are normal. Pupils are equal, round, and reactive to light.  Neck: Normal range of motion. Neck supple.  Cardiovascular: Regular rhythm, S1 normal and S2 normal.  Exam reveals no gallop and no friction rub.   No murmur heard. Pulmonary/Chest: Effort normal and breath sounds normal. No respiratory distress. She exhibits no tenderness.  Abdominal: Soft. Normal appearance and bowel sounds are normal. She exhibits no distension. There is no hepatosplenomegaly. There is generalized tenderness. There is no rebound, no guarding, no tenderness at McBurney's point and negative Murphy's sign. No hernia.  Musculoskeletal: Normal range of motion.  Neurological: She is alert and oriented to person, place, and time. She has normal strength. No cranial nerve deficit or sensory deficit. Coordination normal. GCS eye subscore is 4. GCS verbal subscore is 5. GCS motor subscore is 6.  Skin: Skin is warm, dry and intact. No rash noted. No cyanosis.  Psychiatric: She has a normal mood and affect. Her speech is normal and behavior is normal. Thought content normal.  Nursing note and vitals reviewed.   ED Course  Procedures (including critical care time) DIAGNOSTIC STUDIES: Oxygen Saturation is 97% on RA, nl by my interpretation.    COORDINATION OF  CARE: 10:22 AM-Discussed treatment plan which includes labs, imaging, antinausea meds, IV fluids with pt at bedside and pt agreed to plan.   Labs Review Labs Reviewed  CBC WITH DIFFERENTIAL/PLATELET - Abnormal; Notable for the following:    Lymphocytes Relative 10 (*)    Lymphs Abs 0.6 (*)    Monocytes Relative 14 (*)    All other components within normal limits  COMPREHENSIVE METABOLIC PANEL - Abnormal; Notable for the following:    Glucose, Bld 137 (*)    BUN 22 (*)    Calcium 8.7 (*)    All other components within normal limits  LIPASE, BLOOD - Abnormal; Notable for the following:    Lipase 13 (*)    All other components within normal limits  I-STAT CG4 LACTIC ACID, ED - Abnormal; Notable for the following:    Lactic Acid, Venous 3.64 (*)    All other components within normal limits  URINALYSIS, ROUTINE W REFLEX MICROSCOPIC (NOT AT [facility])    Imaging Review Ct Abdomen Pelvis W Contrast  10/04/2014   CLINICAL DATA:  Abdominal pain.  Vomiting and loose stools.  EXAM: CT ABDOMEN AND PELVIS WITH CONTRAST  TECHNIQUE: Multidetector CT imaging of the abdomen and pelvis was performed using the standard protocol following bolus administration of intravenous contrast.  CONTRAST:  48mL OMNIPAQUE IOHEXOL 300 MG/ML  SOLN  COMPARISON:  CT 07/20/2013  FINDINGS: Lower chest: Lung bases clear. Small hiatal hernia. Normal heart size.  Hepatobiliary: Normal-appearing liver. No focal liver lesion. Gallbladder is absent. Bile ducts nondilated.  Pancreas: Negative  Spleen: Negative  Adrenals/Urinary Tract: Normal kidneys. No renal mass or obstruction. No renal calculi. Adrenal glands normal. Urinary bladder difficult to evaluate due to streak artifact from bilateral hip replacement.  Stomach/Bowel: Small hiatal hernia. Liquid stool in the right colon without significant colonic thickening. Small bowel mildly dilated without thickening of the small bowel. Appendectomy.  Vascular/Lymphatic: Atherosclerotic aorta  without aneurysm  Reproductive: Normal-sized uterus.  No pelvic mass  Other: No free fluid.  Musculoskeletal: Lumbar degenerative changes. Disc degeneration most prominent at L1-2 and L2-3. Mild  chronic compression fracture L3. No acute bony abnormality  IMPRESSION: Liquid stool in the right colon without colonic thickening. Mild small bowel dilatation. Findings are most likely due to gastroenteritis. Mild small bowel dilatation could be due to a early obstruction versus ileus.  Postop cholecystectomy and appendectomy.   Electronically Signed   By: Franchot Gallo M.D.   On: 10/04/2014 12:02   I have personally reviewed and evaluated these images and lab results as part of my medical decision-making.   EKG Interpretation   Date/Time:  Wednesday October 04 2014 10:42:54 EDT Ventricular Rate:  77 PR Interval:  164 QRS Duration: 95 QT Interval:  461 QTC Calculation: 522 R Axis:   12 Text Interpretation:  Sinus rhythm Borderline low voltage, extremity leads  Nonspecific T abnormalities, lateral leads - new since 2014 Prolonged QT  interval Confirmed by Tyneisha Hegeman  MD, Jassen Sarver [id number]) on 10/04/2014  10:48:06 AM      MDM   Final diagnoses:  Nausea and vomiting, vomiting of unspecified type  Abdominal pain, acute    Presents to the ER for evaluation of abdominal pain. Patient reports that she hadn't felt well for a couple of days, then started having abdominal distention with nausea and vomiting and intermittent diarrhea yesterday. Patient concerned because of a history of small bowel obstruction. Distention has resolved today. She had a fairly benign exam. There was mild tenderness but no guarding, rebound. Workup has been unremarkable including CAT scan. Patient administered IV fluids here in the ER. She'll be discharged for symptomatically treatment. Return if symptoms worsen.  I personally performed the services described in this documentation, which was scribed in my presence. The  recorded information has been reviewed and is accurate.      Orpah Greek, MD 10/04/14 [phone]

## 2014-10-11 ENCOUNTER — Other Ambulatory Visit (HOSPITAL_COMMUNITY): Payer: Self-pay | Admitting: Pulmonary Disease

## 2014-10-11 DIAGNOSIS — R109 Unspecified abdominal pain: Secondary | ICD-10-CM

## 2014-10-11 DIAGNOSIS — K5909 Other constipation: Secondary | ICD-10-CM

## 2014-10-13 ENCOUNTER — Other Ambulatory Visit (HOSPITAL_COMMUNITY): Payer: PPO

## 2014-10-16 ENCOUNTER — Other Ambulatory Visit (HOSPITAL_COMMUNITY): Payer: PPO

## 2014-10-16 ENCOUNTER — Ambulatory Visit (HOSPITAL_COMMUNITY): Admission: RE | Admit: 2014-10-16 | Payer: PPO | Source: Ambulatory Visit

## 2014-10-17 ENCOUNTER — Ambulatory Visit (INDEPENDENT_AMBULATORY_CARE_PROVIDER_SITE_OTHER): Payer: PPO | Admitting: Internal Medicine

## 2014-10-23 ENCOUNTER — Ambulatory Visit (INDEPENDENT_AMBULATORY_CARE_PROVIDER_SITE_OTHER): Payer: PPO | Admitting: Internal Medicine

## 2014-10-31 ENCOUNTER — Ambulatory Visit (INDEPENDENT_AMBULATORY_CARE_PROVIDER_SITE_OTHER): Payer: PPO | Admitting: Internal Medicine

## 2014-10-31 ENCOUNTER — Encounter (INDEPENDENT_AMBULATORY_CARE_PROVIDER_SITE_OTHER): Payer: Self-pay | Admitting: Internal Medicine

## 2014-10-31 VITALS — BP 118/74 | HR 78 | Temp 97.0°F | Resp 18 | Ht 67.5 in | Wt 171.1 lb

## 2014-10-31 DIAGNOSIS — K5900 Constipation, unspecified: Secondary | ICD-10-CM

## 2014-10-31 DIAGNOSIS — K5909 Other constipation: Secondary | ICD-10-CM

## 2014-10-31 MED ORDER — BISACODYL 10 MG RE SUPP: 10.0000 mg | Freq: Every day | RECTAL | Status: DC | PRN

## 2014-10-31 NOTE — Patient Instructions (Signed)
Try to eat fiber rich foods at every meal. Do not take Exlac while you're on Amitiza and senna. Dulcolax suppository daily after breakfast unless you have spontaneous bowel movement. Stool diary until office visit in one month

## 2014-10-31 NOTE — Progress Notes (Signed)
Presenting complaint;  Constipation. Medication not working.  Subjective:  Zoe [name] is 79-year-old Caucasian female who is here for scheduled visit. She was last seen on 04/10/2014 and was doing well while on Linzess. She says this medication stopped working and she is back on senna and she is also using Ex-Lax on when necessary basis. She saw Dr. Luan Pulling and was begun on Amitiza but still not having results to her satisfaction. On 10/04/2014 she was seen in emergency room for nausea vomiting abdominal pain and diarrhea. Abdominopelvic CT was performed and revealed liquid stool in proximal colon but no evidence of colonic wall thickening. She had mild dilation to use of small bowel. She was treated and discharged. Last when she was at the beach and did not have bowel movement for 10 days. In addition to her usual medication she took 12 pills of Ex-Lax all at the same time and finally had a bowel movement. When she does have a bowel movement on she passes is balls and then she passes liquid stool. She states she's not had a normal bowel movement in several months. She eats prunes every day. She is doing daily exercise to tone upper abdominal wall muscles. She denies melena or rectal bleeding. She continues complain of bloating and intermittent lower abdominal pain. She is not having throbbing pain on the left side that she had for years. Her appetite has decreased. She has lost 5 pounds since her last visit. She states she is under a lot of stress as she is having to deal with granddaughter who is on drugs and pregnant again. She lost her best friend this week. She died before she could visit her in [place]. She states omeprazole is controlling her heartburn.    Current Medications: Outpatient Encounter Prescriptions as of 10/31/2014  Medication Sig  . albuterol (PROVENTIL HFA;VENTOLIN HFA) 108 (90 BASE) MCG/ACT inhaler Inhale 2 puffs into the lungs every 6 (six) hours as needed for wheezing or  shortness of breath.  Marland Kitchen albuterol (PROVENTIL) (2.5 MG/3ML) 0.083% nebulizer solution Take 2.5 mg by nebulization 2 (two) times daily as needed for wheezing or shortness of breath.  . allopurinol (ZYLOPRIM) 300 MG tablet Take 300 mg by mouth daily.  Marland Kitchen ALPRAZolam (XANAX) 0.5 MG tablet Take 0.5 mg by mouth as needed for anxiety.   Marland Kitchen amLODipine (NORVASC) 2.5 MG tablet Take 2.5 mg by mouth every other day. In the am  . aspirin 81 MG tablet Take 81 mg by mouth daily.  . Biotin 1000 MCG tablet Take 1,000 mcg by mouth every morning. Patient states that she takes 1000 - 5000 daily  . digoxin (LANOXIN) 0.25 MG tablet Take 0.125 mg by mouth daily.   Marland Kitchen doxepin (SINEQUAN) 100 MG capsule Take 100 mg by mouth at bedtime.   . fexofenadine (ALLEGRA) 60 MG tablet Take 30 mg by mouth daily.   Marland Kitchen gabapentin (NEURONTIN) 300 MG capsule Take 300 mg by mouth 2 (two) times daily.   Marland Kitchen lubiprostone (AMITIZA) 24 MCG capsule Take 24 mcg by mouth 2 (two) times daily with a meal.  . naproxen (NAPROSYN) 500 MG tablet Take 500 mg by mouth 2 (two) times daily.   Marland Kitchen omeprazole (PRILOSEC) 20 MG capsule Take 20 mg by mouth every morning.  . pravastatin (PRAVACHOL) 20 MG tablet Take 20 mg by mouth every other day.   . quiNINE (QUALAQUIN) 324 MG capsule Take 150-300 mg by mouth at bedtime. Patient states that she takes 150 mg by mouth one night ,  then the next night she takes 300 mg by mouth. Alternates.  . senna (SENOKOT) 8.6 MG tablet Take 5 tablets by mouth 2 (two) times daily.  . Sennosides (EX-LAX PO) Take by mouth as needed.  . [DISCONTINUED] Linaclotide (LINZESS) 290 MCG CAPS capsule Take 1 capsule (290 mcg total) by mouth daily. (Patient not taking: Reported on 10/31/2014)  . [DISCONTINUED] ondansetron (ZOFRAN) 4 MG tablet Take 1 tablet (4 mg total) by mouth every 6 (six) hours. (Patient not taking: Reported on 10/31/2014)   No facility-administered encounter medications on file as of 10/31/2014.     Objective: Blood pressure  118/74, pulse 78, temperature 97 F (36.1 C), temperature source Oral, resp. rate 18, height 5' 7.5" (1.715 m), weight 171 lb 1.6 oz (77.61 kg). Patient is alert and in no acute distress. Conjunctiva is pink. Sclera is nonicteric Oropharyngeal mucosa is normal. No neck masses or thyromegaly noted. Cardiac exam with regular rhythm normal S1 and S2. No murmur or gallop noted. Lungs are clear to auscultation. Abdomen is symmetrical. Bowel sounds are normal. On palpation abdomen is soft with mild tenderness in LLQ. No organomegaly or masses. No LE edema or clubbing noted.  Labs/studies Results:  abdominopelvic CT from 10/04/2014 reviewed with patient.  Last colonoscopy was on 12/09/2013 revealing redundant colon with melanosis coli and she had 3 polyps removed.    Assessment:  #1. Chronic constipation. Patient has been on laxity is for over 50 years. Unfortunately her normal pathways for colonic motility are permanently altered. She may eventually need a subtotal colectomy for which she seen Dr. Leighton Ruff in the past. Patient is back on high-dose senna since Amitiza is not working. Patient is on low fiber diet which does not help. #2. GERD. PPIs working.  Plan:  Patient advised not to take Ex-Lax. She will continue Amitiza and senna at current dose. Patient advised to use Dulcolax every morning unless she has spontaneous bowel movement. Patient advised to eat fiber rich foods and she should eat 1 or 2 apples every day. She will keep stool diary and return for office visit in one month.

## 2014-11-01 ENCOUNTER — Other Ambulatory Visit (INDEPENDENT_AMBULATORY_CARE_PROVIDER_SITE_OTHER): Payer: Self-pay | Admitting: *Deleted

## 2014-11-01 MED ORDER — LUBIPROSTONE 24 MCG PO CAPS: 24.0000 ug | ORAL_CAPSULE | Freq: Two times a day (BID) | ORAL | Status: DC

## 2014-11-01 NOTE — Telephone Encounter (Signed)
Patient needed prescription to be for correct amount.

## 2014-11-02 ENCOUNTER — Ambulatory Visit (HOSPITAL_COMMUNITY): Admission: RE | Admit: 2014-11-02 | Payer: PPO | Source: Ambulatory Visit

## 2014-11-28 ENCOUNTER — Encounter (INDEPENDENT_AMBULATORY_CARE_PROVIDER_SITE_OTHER): Payer: Self-pay | Admitting: Internal Medicine

## 2014-11-28 ENCOUNTER — Ambulatory Visit (INDEPENDENT_AMBULATORY_CARE_PROVIDER_SITE_OTHER): Payer: PPO | Admitting: Internal Medicine

## 2014-11-28 VITALS — BP 140/76 | HR 72 | Temp 97.0°F | Resp 18 | Ht 67.5 in | Wt 176.9 lb

## 2014-11-28 DIAGNOSIS — K5909 Other constipation: Secondary | ICD-10-CM

## 2014-11-28 DIAGNOSIS — K5900 Constipation, unspecified: Secondary | ICD-10-CM | POA: Diagnosis not present

## 2014-11-28 MED ORDER — LUBIPROSTONE 24 MCG PO CAPS: 24.0000 ug | ORAL_CAPSULE | Freq: Every day | ORAL | Status: DC

## 2014-11-28 NOTE — Patient Instructions (Signed)
Remember to eat at least 1 fiber rich product at each meal. Keep stool diary for 2 weeks prior to next visit in 2 months.

## 2014-11-28 NOTE — Progress Notes (Signed)
Presenting complaint;   follow-up for constipation.  Subjective:   patient is 79 year old Caucasian female who has lifelong constipation with dependence on laxatives at high-dose was not doing well when seen on on 10/31/2014. She was advised to go back on Amitiza.  She was advised to use Dulcolax suppository daily unless she has satisfactory spontaneous bowel movement. She has Diary as recommended.  Patient feels much better. She has not dropped center cord dose. She is still taking 5 tablets twice a day.  Review of diary reveals bowel movement daily or every other day described to be fair or good.  She had day without a bowel movement 2. He also had bowel movements in the middle of night or early in the morning on 2 occasions.  Sshe is not having any side effects with Amitiza.  Review of food diary reveals consumption of very few fiber rich foods.  She herself is quite surprised by looking at the diary and realizing that she is not eating much in the day of fiber rich foods.  She is trying to eat at least one apple every day.  She has had very little left-sided abdominal pains since her last office visit.   Current Medications: Outpatient Encounter Prescriptions as of 11/28/2014  Medication Sig  . albuterol (PROVENTIL HFA;VENTOLIN HFA) 108 (90 BASE) MCG/ACT inhaler Inhale 2 puffs into the lungs every 6 (six) hours as needed for wheezing or shortness of breath.  Marland Kitchen albuterol (PROVENTIL) (2.5 MG/3ML) 0.083% nebulizer solution Take 2.5 mg by nebulization 2 (two) times daily as needed for wheezing or shortness of breath.  . allopurinol (ZYLOPRIM) 300 MG tablet Take 300 mg by mouth daily.  Marland Kitchen ALPRAZolam (XANAX) 0.5 MG tablet Take 0.5 mg by mouth as needed for anxiety.   Marland Kitchen amLODipine (NORVASC) 2.5 MG tablet Take 2.5 mg by mouth every other day. In the am  . aspirin 81 MG tablet Take 81 mg by mouth daily.  . Biotin 1000 MCG tablet Take 1,000 mcg by mouth every morning. Patient states that she takes  1000 - 5000 daily  . bisacodyl (DULCOLAX) 10 MG suppository Place 1 suppository (10 mg total) rectally daily as needed for moderate constipation.  . digoxin (LANOXIN) 0.25 MG tablet Take 0.125 mg by mouth daily.   Marland Kitchen doxepin (SINEQUAN) 100 MG capsule Take 100 mg by mouth at bedtime.   . fexofenadine (ALLEGRA) 60 MG tablet Take 30 mg by mouth daily.   Marland Kitchen gabapentin (NEURONTIN) 300 MG capsule Take 300 mg by mouth 2 (two) times daily.   Marland Kitchen lubiprostone (AMITIZA) 24 MCG capsule Take 1 capsule (24 mcg total) by mouth 2 (two) times daily with a meal.  . naproxen (NAPROSYN) 500 MG tablet Take 500 mg by mouth 2 (two) times daily.   Marland Kitchen omeprazole (PRILOSEC) 20 MG capsule Take 20 mg by mouth every morning.  . pravastatin (PRAVACHOL) 20 MG tablet Take 20 mg by mouth every other day.   . quiNINE (QUALAQUIN) 324 MG capsule Take 150-300 mg by mouth at bedtime. Patient states that she takes 150 mg by mouth one night , then the next night she takes 300 mg by mouth. Alternates.  . senna (SENOKOT) 8.6 MG tablet Take 5 tablets by mouth 2 (two) times daily.   No facility-administered encounter medications on file as of 11/28/2014.     Objective: Blood pressure 140/76, pulse 72, temperature 97 F (36.1 C), temperature source Oral, resp. rate 18, height 5' 7.5" (1.715 m), weight 176 lb 14.4 oz (  80.241 kg). Ppatient is alert and in no acute distress. Conjunctiva is pink. Sclera is nonicteric Oropharyngeal mucosa is normal. No neck masses or thyromegaly noted. Cardiac exam with regular rhythm normal S1 and S2. No murmur or gallop noted. Lungs are clear to auscultation. Abdomen is symmetrical soft and nontender without organomegaly or masses. No LE edema or clubbing noted.     Assessment:  #1.  Chronic constipation. She is doing much better with current regiment. She needs to slowly wean herself off center tablets. Review for food diary reveals poor intake of fiber rich foods and she needs increase intake of  fruits and vegetables.   Plan:   Ddecrease Amitiza to 24 g by mouth every mor.   Drop Senokot tablet dose to 4 twice a day and after week or so drop it further as tolerated.  continue the use Dulcolax suppository daily or on as-needed basis.  She will keep stool diary for 2 weeks prior to her next visit.  Office visit in 2 months.

## 2014-12-12 ENCOUNTER — Ambulatory Visit (INDEPENDENT_AMBULATORY_CARE_PROVIDER_SITE_OTHER): Payer: PPO | Admitting: Internal Medicine

## 2015-01-17 ENCOUNTER — Ambulatory Visit (HOSPITAL_COMMUNITY)
Admission: RE | Admit: 2015-01-17 | Discharge: 2015-01-17 | Disposition: A | Discharge status: Home / Self Care | Payer: PPO | Source: Ambulatory Visit | Attending: Pulmonary Disease | Admitting: Pulmonary Disease

## 2015-01-17 ENCOUNTER — Other Ambulatory Visit (HOSPITAL_COMMUNITY): Payer: Self-pay | Admitting: Pulmonary Disease

## 2015-01-17 DIAGNOSIS — R05 Cough: Secondary | ICD-10-CM

## 2015-01-17 DIAGNOSIS — R0989 Other specified symptoms and signs involving the circulatory and respiratory systems: Secondary | ICD-10-CM | POA: Diagnosis not present

## 2015-01-17 DIAGNOSIS — R0602 Shortness of breath: Secondary | ICD-10-CM | POA: Insufficient documentation

## 2015-01-17 DIAGNOSIS — J45909 Unspecified asthma, uncomplicated: Secondary | ICD-10-CM | POA: Insufficient documentation

## 2015-01-17 DIAGNOSIS — Z87891 Personal history of nicotine dependence: Secondary | ICD-10-CM | POA: Insufficient documentation

## 2015-01-17 DIAGNOSIS — R062 Wheezing: Secondary | ICD-10-CM | POA: Diagnosis not present

## 2015-01-17 DIAGNOSIS — R059 Cough, unspecified: Secondary | ICD-10-CM

## 2015-01-31 DIAGNOSIS — J449 Chronic obstructive pulmonary disease, unspecified: Secondary | ICD-10-CM | POA: Diagnosis not present

## 2015-02-13 ENCOUNTER — Ambulatory Visit (INDEPENDENT_AMBULATORY_CARE_PROVIDER_SITE_OTHER): Payer: PPO | Admitting: Internal Medicine

## 2015-02-13 ENCOUNTER — Encounter (INDEPENDENT_AMBULATORY_CARE_PROVIDER_SITE_OTHER): Payer: Self-pay | Admitting: Internal Medicine

## 2015-02-13 VITALS — BP 138/72 | HR 74 | Temp 97.5°F | Resp 18 | Ht 67.5 in | Wt 176.2 lb

## 2015-02-13 DIAGNOSIS — K5900 Constipation, unspecified: Secondary | ICD-10-CM | POA: Diagnosis not present

## 2015-02-13 DIAGNOSIS — K5909 Other constipation: Secondary | ICD-10-CM

## 2015-02-13 NOTE — Patient Instructions (Addendum)
Try to decrease senna dose as tolerated. Can use suppository every other day or three times a week as needed.

## 2015-02-13 NOTE — Progress Notes (Signed)
Presenting complaint;  Follow-up for chronic constipation.  Subjective:  Patient is 80 year old Caucasian female who has lifelong history of constipation and is here for scheduled visit. She was last seen on 11/28/2014. She states she went 10 days without a bowel movement. She's gone back to taking senna 5 tablets twice daily. She remains on Amitiza and is using Dulcolax suppository on as-needed basis. She generally has 2-3 bowel movements per week. Most of her stools are mushy or liquid. Every now and then she may have formed stool. She admits to not eating fiber rich foods. She states she is been struggling with bronchitis and had to be on multiple courses of antibiotic and also prednisone. She is not having throbbing abdominal pain which she used to have. She complains of epigastric and lower sternal pain described as charlie horse relieved with stretching.    Current Medications: Outpatient Encounter Prescriptions as of 02/13/2015  Medication Sig  . albuterol (PROVENTIL HFA;VENTOLIN HFA) 108 (90 BASE) MCG/ACT inhaler Inhale 2 puffs into the lungs every 6 (six) hours as needed for wheezing or shortness of breath.  Marland Kitchen albuterol (PROVENTIL) (2.5 MG/3ML) 0.083% nebulizer solution Take 2.5 mg by nebulization 2 (two) times daily as needed for wheezing or shortness of breath.  . allopurinol (ZYLOPRIM) 300 MG tablet Take 300 mg by mouth daily.  Marland Kitchen ALPRAZolam (XANAX) 0.5 MG tablet Take 0.5 mg by mouth as needed for anxiety.   Marland Kitchen amLODipine (NORVASC) 2.5 MG tablet Take 2.5 mg by mouth every other day. In the am  . aspirin 81 MG tablet Take 81 mg by mouth daily.  . Biotin 1000 MCG tablet Take 1,000 mcg by mouth every morning. Patient states that she takes 1000 - 5000 daily  . bisacodyl (DULCOLAX) 10 MG suppository Place 1 suppository (10 mg total) rectally daily as needed for moderate constipation.  . digoxin (LANOXIN) 0.25 MG tablet Take 0.125 mg by mouth daily.   Marland Kitchen doxepin (SINEQUAN) 100 MG capsule  Take 100 mg by mouth at bedtime.   . fexofenadine (ALLEGRA) 60 MG tablet Take 30 mg by mouth daily.   Marland Kitchen FLUZONE HIGH-DOSE 0.5 ML SUSY ADM 0.5ML IM UTD  . gabapentin (NEURONTIN) 300 MG capsule Take 300 mg by mouth 2 (two) times daily.   Marland Kitchen lubiprostone (AMITIZA) 24 MCG capsule Take 1 capsule (24 mcg total) by mouth daily with breakfast. (Patient taking differently: Take 24 mcg by mouth 2 (two) times daily with a meal. )  . naproxen (NAPROSYN) 500 MG tablet Take 500 mg by mouth daily.   Marland Kitchen omeprazole (PRILOSEC) 20 MG capsule Take 20 mg by mouth every morning.  . pravastatin (PRAVACHOL) 20 MG tablet Take 20 mg by mouth every other day.   . quiNINE (QUALAQUIN) 324 MG capsule Take 150-300 mg by mouth at bedtime. Patient states that she takes 150 mg by mouth one night , then the next night she takes 300 mg by mouth. Alternates.  . senna (SENOKOT) 8.6 MG tablet Take 10 tablets by mouth daily.    No facility-administered encounter medications on file as of 02/13/2015.     Objective: Blood pressure 138/72, pulse 74, temperature 97.5 F (36.4 C), temperature source Oral, resp. rate 18, height 5' 7.5" (1.715 m), weight 176 lb 3.2 oz (79.924 kg). Patient is alert and in no acute distress. Conjunctiva is pink. Sclera is nonicteric Oropharyngeal mucosa is normal. No neck masses or thyromegaly noted. Cardiac exam with regular rhythm normal S1 and S2. No murmur or gallop noted. Lungs  are clear to auscultation. Abdomen is symmetrical. Bowel sounds are normal. On palpation abdomen is soft and nontender without organomegaly or masses. No LE edema or clubbing noted.   Assessment:  #1. Chronic constipation. She has undergone complete evaluation the past and she has been evaluated for subtotal colectomy surgery postponed because of potential risks. She is on unusually high dose of senna but she is been on this dose for many many years. Even at this dose she is requiring additional help. I'm afraid dietary  noncompliance is making her constipation worse. #2. History of colonic adenoma. Last colonoscopy was in November 2015 and next procedure will be due in November 2018.   Plan:   Patient encouraged to consume fiber richfoods daily and no on whnecesry bs.  She will try to decrease snna doseas tolerated  Continue Amitiza as before.  Offic visit in 6 onths.

## 2015-02-22 DIAGNOSIS — M545 Low back pain: Secondary | ICD-10-CM | POA: Diagnosis not present

## 2015-02-22 DIAGNOSIS — M25551 Pain in right hip: Secondary | ICD-10-CM | POA: Diagnosis not present

## 2015-03-01 DIAGNOSIS — M545 Low back pain: Secondary | ICD-10-CM | POA: Diagnosis not present

## 2015-03-01 DIAGNOSIS — M7061 Trochanteric bursitis, right hip: Secondary | ICD-10-CM | POA: Diagnosis not present

## 2015-04-03 DIAGNOSIS — Z85820 Personal history of malignant melanoma of skin: Secondary | ICD-10-CM | POA: Diagnosis not present

## 2015-04-03 DIAGNOSIS — L821 Other seborrheic keratosis: Secondary | ICD-10-CM | POA: Diagnosis not present

## 2015-04-03 DIAGNOSIS — L820 Inflamed seborrheic keratosis: Secondary | ICD-10-CM | POA: Diagnosis not present

## 2015-04-03 DIAGNOSIS — L570 Actinic keratosis: Secondary | ICD-10-CM | POA: Diagnosis not present

## 2015-04-03 DIAGNOSIS — L812 Freckles: Secondary | ICD-10-CM | POA: Diagnosis not present

## 2015-04-05 DIAGNOSIS — J449 Chronic obstructive pulmonary disease, unspecified: Secondary | ICD-10-CM | POA: Diagnosis not present

## 2015-05-02 ENCOUNTER — Encounter (HOSPITAL_COMMUNITY): Payer: Self-pay

## 2015-05-03 DIAGNOSIS — W0110XA Fall on same level from slipping, tripping and stumbling with subsequent striking against unspecified object, initial encounter: Secondary | ICD-10-CM | POA: Diagnosis not present

## 2015-05-03 DIAGNOSIS — S0990XA Unspecified injury of head, initial encounter: Secondary | ICD-10-CM | POA: Diagnosis not present

## 2015-05-03 DIAGNOSIS — S060X0A Concussion without loss of consciousness, initial encounter: Secondary | ICD-10-CM | POA: Diagnosis not present

## 2015-05-21 DIAGNOSIS — Z961 Presence of intraocular lens: Secondary | ICD-10-CM | POA: Diagnosis not present

## 2015-05-21 DIAGNOSIS — H26492 Other secondary cataract, left eye: Secondary | ICD-10-CM | POA: Diagnosis not present

## 2015-05-21 DIAGNOSIS — H40013 Open angle with borderline findings, low risk, bilateral: Secondary | ICD-10-CM | POA: Diagnosis not present

## 2015-05-21 DIAGNOSIS — H532 Diplopia: Secondary | ICD-10-CM | POA: Diagnosis not present

## 2015-05-24 DIAGNOSIS — Z6829 Body mass index (BMI) 29.0-29.9, adult: Secondary | ICD-10-CM | POA: Diagnosis not present

## 2015-05-24 DIAGNOSIS — J441 Chronic obstructive pulmonary disease with (acute) exacerbation: Secondary | ICD-10-CM | POA: Diagnosis not present

## 2015-05-28 DIAGNOSIS — J441 Chronic obstructive pulmonary disease with (acute) exacerbation: Secondary | ICD-10-CM | POA: Diagnosis not present

## 2015-05-31 ENCOUNTER — Other Ambulatory Visit (INDEPENDENT_AMBULATORY_CARE_PROVIDER_SITE_OTHER): Payer: Self-pay | Admitting: Internal Medicine

## 2015-08-14 ENCOUNTER — Ambulatory Visit (INDEPENDENT_AMBULATORY_CARE_PROVIDER_SITE_OTHER): Payer: PPO | Admitting: Internal Medicine

## 2015-08-15 ENCOUNTER — Telehealth (HOSPITAL_COMMUNITY): Payer: Self-pay | Admitting: Physical Therapy

## 2015-08-15 NOTE — Telephone Encounter (Signed)
Faxed Medical reports from 03/23/2014-04/29/2014 to: [facility] Phone# [phone] Fax: [phone] Medcial Record release was signe my patient  and put in our hold file in the hall, since there wasn't away to attach it to the orginal referral date back in 2016. NF 08/15/15

## 2015-10-30 ENCOUNTER — Ambulatory Visit (INDEPENDENT_AMBULATORY_CARE_PROVIDER_SITE_OTHER): Payer: PPO | Admitting: Internal Medicine

## 2015-11-06 ENCOUNTER — Ambulatory Visit (INDEPENDENT_AMBULATORY_CARE_PROVIDER_SITE_OTHER): Payer: PPO | Admitting: Internal Medicine

## 2015-11-27 ENCOUNTER — Ambulatory Visit (INDEPENDENT_AMBULATORY_CARE_PROVIDER_SITE_OTHER): Payer: PPO | Admitting: Internal Medicine

## 2016-02-19 ENCOUNTER — Ambulatory Visit (INDEPENDENT_AMBULATORY_CARE_PROVIDER_SITE_OTHER): Payer: PPO | Admitting: Internal Medicine

## 2016-03-12 DIAGNOSIS — R5383 Other fatigue: Secondary | ICD-10-CM | POA: Diagnosis not present

## 2016-04-22 DIAGNOSIS — R0781 Pleurodynia: Secondary | ICD-10-CM | POA: Diagnosis not present

## 2016-04-22 DIAGNOSIS — R0782 Intercostal pain: Secondary | ICD-10-CM | POA: Diagnosis not present

## 2016-05-06 ENCOUNTER — Ambulatory Visit (INDEPENDENT_AMBULATORY_CARE_PROVIDER_SITE_OTHER): Payer: PPO | Admitting: Internal Medicine

## 2016-05-07 DIAGNOSIS — R918 Other nonspecific abnormal finding of lung field: Secondary | ICD-10-CM | POA: Diagnosis not present

## 2016-05-07 DIAGNOSIS — G4730 Sleep apnea, unspecified: Secondary | ICD-10-CM | POA: Diagnosis not present

## 2016-05-07 DIAGNOSIS — R0781 Pleurodynia: Secondary | ICD-10-CM | POA: Diagnosis not present

## 2016-05-07 DIAGNOSIS — J45909 Unspecified asthma, uncomplicated: Secondary | ICD-10-CM | POA: Diagnosis not present

## 2016-05-26 ENCOUNTER — Other Ambulatory Visit (INDEPENDENT_AMBULATORY_CARE_PROVIDER_SITE_OTHER): Payer: Self-pay | Admitting: Internal Medicine

## 2016-08-06 DIAGNOSIS — F419 Anxiety disorder, unspecified: Secondary | ICD-10-CM | POA: Diagnosis not present

## 2016-08-06 DIAGNOSIS — H538 Other visual disturbances: Secondary | ICD-10-CM | POA: Diagnosis not present

## 2016-08-06 DIAGNOSIS — G589 Mononeuropathy, unspecified: Secondary | ICD-10-CM | POA: Diagnosis not present

## 2016-08-06 DIAGNOSIS — K5900 Constipation, unspecified: Secondary | ICD-10-CM | POA: Diagnosis not present

## 2016-08-06 DIAGNOSIS — M159 Polyosteoarthritis, unspecified: Secondary | ICD-10-CM | POA: Diagnosis not present

## 2016-08-06 DIAGNOSIS — Z0000 Encounter for general adult medical examination without abnormal findings: Secondary | ICD-10-CM | POA: Diagnosis not present

## 2016-08-06 DIAGNOSIS — M109 Gout, unspecified: Secondary | ICD-10-CM | POA: Diagnosis not present

## 2016-08-06 DIAGNOSIS — J45998 Other asthma: Secondary | ICD-10-CM | POA: Diagnosis not present

## 2016-08-06 DIAGNOSIS — E785 Hyperlipidemia, unspecified: Secondary | ICD-10-CM | POA: Diagnosis not present

## 2016-08-07 DIAGNOSIS — H40013 Open angle with borderline findings, low risk, bilateral: Secondary | ICD-10-CM | POA: Diagnosis not present

## 2016-08-21 DIAGNOSIS — Z1211 Encounter for screening for malignant neoplasm of colon: Secondary | ICD-10-CM | POA: Diagnosis not present

## 2016-09-25 ENCOUNTER — Telehealth (INDEPENDENT_AMBULATORY_CARE_PROVIDER_SITE_OTHER): Payer: Self-pay | Admitting: Internal Medicine

## 2016-09-25 NOTE — Telephone Encounter (Signed)
Patient was given samples.. She was also called and made aware.

## 2016-09-25 NOTE — Telephone Encounter (Signed)
Patient called, requesting Amitiza.  [phone]

## 2016-09-30 ENCOUNTER — Ambulatory Visit (INDEPENDENT_AMBULATORY_CARE_PROVIDER_SITE_OTHER): Payer: PPO | Admitting: Internal Medicine

## 2016-10-08 DIAGNOSIS — K5900 Constipation, unspecified: Secondary | ICD-10-CM | POA: Diagnosis not present

## 2016-10-08 DIAGNOSIS — M159 Polyosteoarthritis, unspecified: Secondary | ICD-10-CM | POA: Diagnosis not present

## 2016-10-08 DIAGNOSIS — J449 Chronic obstructive pulmonary disease, unspecified: Secondary | ICD-10-CM | POA: Diagnosis not present

## 2016-10-21 ENCOUNTER — Encounter (INDEPENDENT_AMBULATORY_CARE_PROVIDER_SITE_OTHER): Payer: Self-pay | Admitting: Internal Medicine

## 2016-10-21 ENCOUNTER — Ambulatory Visit (INDEPENDENT_AMBULATORY_CARE_PROVIDER_SITE_OTHER): Payer: PPO | Admitting: Internal Medicine

## 2016-10-21 VITALS — BP 132/80 | HR 74 | Temp 97.3°F | Resp 18 | Ht 67.5 in | Wt 186.4 lb

## 2016-10-21 DIAGNOSIS — K5909 Other constipation: Secondary | ICD-10-CM

## 2016-10-21 DIAGNOSIS — Z86010 Personal history of colonic polyps: Secondary | ICD-10-CM | POA: Diagnosis not present

## 2016-10-21 MED ORDER — SENNOSIDES 8.6 MG PO TABS: 5.0000 | ORAL_TABLET | Freq: Every day | ORAL | Status: DC

## 2016-10-21 MED ORDER — PLECANATIDE 3 MG PO TABS: 3.0000 mg | ORAL_TABLET | Freq: Every day | ORAL | 5 refills | Status: DC

## 2016-10-21 NOTE — Progress Notes (Signed)
Presenting complaint;  Follow-up constipation.  Subjective:  Zoe [name] is 81 year old Caucasian female was long-standing constipation and is laxative dependent who is here for scheduled visit. She was last seen in January 2017. She has been on Amitiza. She feels it is not helping anymore and she has gone back to Senokot and she is taking 10 tablets a day. She has one to 2 bowel movements per week. Most of her stools are loose but every now and then she may have a formed stool. She complains of sporadic pain under the right rib cage. She describes as charley horse. She seemed to get better with massage or stretching herself. She denies melena or rectal bleeding. Her appetite is good. She has gained 10 pounds since her last visit. She states she has not been walking since she has been traveling frequently to [place] has been time with her son who was in undergoing therapy for lung carcinoma and was quite sick.   Current Medications: Outpatient Encounter Prescriptions as of 10/21/2016  Medication Sig  . albuterol (PROVENTIL HFA;VENTOLIN HFA) 108 (90 BASE) MCG/ACT inhaler Inhale 2 puffs into the lungs every 6 (six) hours as needed for wheezing or shortness of breath.  Marland Kitchen albuterol (PROVENTIL) (2.5 MG/3ML) 0.083% nebulizer solution Take 2.5 mg by nebulization 2 (two) times daily as needed for wheezing or shortness of breath.  . allopurinol (ZYLOPRIM) 300 MG tablet Take 300 mg by mouth daily.  Marland Kitchen ALPRAZolam (XANAX) 0.5 MG tablet Take 0.5 mg by mouth as needed for anxiety.   Marland Kitchen amLODipine (NORVASC) 2.5 MG tablet Take 2.5 mg by mouth every other day. In the am  . aspirin 81 MG tablet Take 81 mg by mouth daily.  . Biotin 1000 MCG tablet Take 1,000 mcg by mouth every morning. Patient states that she takes 1000 - 5000 daily  . digoxin (LANOXIN) 0.25 MG tablet Take 0.125 mg by mouth daily.   Marland Kitchen doxepin (SINEQUAN) 100 MG capsule Take 100 mg by mouth at bedtime.   Marland Kitchen escitalopram (LEXAPRO) 10 MG tablet Take 10 mg by  mouth daily. At 11 am. daily  . fexofenadine (ALLEGRA) 60 MG tablet Take 30 mg by mouth daily.   Marland Kitchen FLUZONE HIGH-DOSE 0.5 ML SUSY ADM 0.5ML IM UTD  . gabapentin (NEURONTIN) 300 MG capsule Take 300 mg by mouth 2 (two) times daily.   Marland Kitchen lubiprostone (AMITIZA) 24 MCG capsule Take 1 capsule (24 mcg total) by mouth daily with breakfast. (Patient taking differently: Take 24 mcg by mouth 2 (two) times daily with a meal. )  . naproxen (NAPROSYN) 500 MG tablet Take 250 mg by mouth 2 (two) times daily with a meal.   . omeprazole (PRILOSEC) 20 MG capsule Take 20 mg by mouth every morning.  . pravastatin (PRAVACHOL) 20 MG tablet Take 20 mg by mouth every other day.   . quiNINE (QUALAQUIN) 324 MG capsule Take 324 mg by mouth at bedtime. Patient states that she takes 150 mg by mouth one night , then the next night she takes 300 mg by mouth. Alternates.  . senna (SENOKOT) 8.6 MG tablet Take 10 tablets by mouth daily.   . [DISCONTINUED] AMITIZA 24 MCG capsule TAKE ONE CAPSULE BY MOUTH TWICE DAILY WITH MEALS  . [DISCONTINUED] bisacodyl (DULCOLAX) 10 MG suppository Place 1 suppository (10 mg total) rectally daily as needed for moderate constipation. (Patient not taking: Reported on 10/21/2016)   No facility-administered encounter medications on file as of 10/21/2016.      Objective: Blood pressure 132/80,  pulse 74, temperature (!) 97.3 F (36.3 C), temperature source Oral, resp. rate 18, height 5' 7.5" (1.715 m), weight 186 lb 6.4 oz (84.6 kg). Patient is alert and in no acute distress. Conjunctiva is pink. Sclera is nonicteric Oropharyngeal mucosa is normal. No neck masses or thyromegaly noted. Cardiac exam with regular rhythm normal S1 and S2. No murmur or gallop noted. Lungs are clear to auscultation. Abdomen is full. Bowel sounds are normal. On palpation abdomen is soft and nontender without organomegaly or masses. No LE edema or clubbing noted.     Assessment:  #1. Chronic constipation. She has had  constipation for several years and has been dependent on laxatives. Attempts at getting her off Senokot have been unsuccessful. She was evaluated by Dr. Leighton Ruff for possible subtotal colectomy. She had anorectal manometry. It was decided not to pursue with surgery.  #2. History of colonic adenomas. She had large flat adenoma in the region of splenic flexure. It was biopsied and ablated with APC. She is due for follow-up exam.   Plan:  Continue high fiber diet. Continue Amitiza. Trulance 3 mg by mouth every morning. Decrease Senokot 065 tablets per day and gradually decrease and stop if possible. Use Dulcolax suppository every morning for a few weeks and thereafter on as-needed basis. Goal is for her to have at least 3 bowel movements per week. Surveillance colonoscopy in January or February next year. Office visit in 6 months.

## 2016-10-21 NOTE — Patient Instructions (Addendum)
Use Dulcolax suppository every morning for the next few weeks and thereafter on as-needed basis Decrease Senokot to 5 tablets daily and gradually decrease dose and stop if possible. Colonoscopy to be scheduled in early part of 2019.

## 2016-10-22 ENCOUNTER — Other Ambulatory Visit (INDEPENDENT_AMBULATORY_CARE_PROVIDER_SITE_OTHER): Payer: Self-pay | Admitting: *Deleted

## 2016-10-22 DIAGNOSIS — Z86010 Personal history of colonic polyps: Secondary | ICD-10-CM

## 2016-10-22 DIAGNOSIS — R05 Cough: Secondary | ICD-10-CM | POA: Diagnosis not present

## 2016-11-04 DIAGNOSIS — L812 Freckles: Secondary | ICD-10-CM | POA: Diagnosis not present

## 2016-11-04 DIAGNOSIS — L820 Inflamed seborrheic keratosis: Secondary | ICD-10-CM | POA: Diagnosis not present

## 2016-11-04 DIAGNOSIS — Z85820 Personal history of malignant melanoma of skin: Secondary | ICD-10-CM | POA: Diagnosis not present

## 2016-11-04 DIAGNOSIS — L821 Other seborrheic keratosis: Secondary | ICD-10-CM | POA: Diagnosis not present

## 2016-11-04 DIAGNOSIS — D692 Other nonthrombocytopenic purpura: Secondary | ICD-10-CM | POA: Diagnosis not present

## 2016-11-04 DIAGNOSIS — L570 Actinic keratosis: Secondary | ICD-10-CM | POA: Diagnosis not present

## 2016-11-05 ENCOUNTER — Other Ambulatory Visit (HOSPITAL_COMMUNITY): Payer: Self-pay | Admitting: Pulmonary Disease

## 2016-11-05 ENCOUNTER — Ambulatory Visit (HOSPITAL_COMMUNITY)
Admission: RE | Admit: 2016-11-05 | Discharge: 2016-11-05 | Disposition: A | Discharge status: Home / Self Care | Payer: PPO | Source: Ambulatory Visit | Attending: Pulmonary Disease | Admitting: Pulmonary Disease

## 2016-11-05 DIAGNOSIS — M7989 Other specified soft tissue disorders: Secondary | ICD-10-CM

## 2016-11-05 DIAGNOSIS — M79661 Pain in right lower leg: Secondary | ICD-10-CM | POA: Diagnosis not present

## 2016-11-05 DIAGNOSIS — M79604 Pain in right leg: Secondary | ICD-10-CM | POA: Diagnosis not present

## 2016-11-05 DIAGNOSIS — R2241 Localized swelling, mass and lump, right lower limb: Secondary | ICD-10-CM | POA: Diagnosis not present

## 2016-11-12 ENCOUNTER — Encounter (INDEPENDENT_AMBULATORY_CARE_PROVIDER_SITE_OTHER): Payer: Self-pay | Admitting: Physician Assistant

## 2016-11-12 ENCOUNTER — Ambulatory Visit (INDEPENDENT_AMBULATORY_CARE_PROVIDER_SITE_OTHER): Payer: PPO

## 2016-11-12 ENCOUNTER — Ambulatory Visit (INDEPENDENT_AMBULATORY_CARE_PROVIDER_SITE_OTHER): Payer: PPO | Admitting: Physician Assistant

## 2016-11-12 DIAGNOSIS — Z96641 Presence of right artificial hip joint: Secondary | ICD-10-CM

## 2016-11-12 DIAGNOSIS — M25561 Pain in right knee: Secondary | ICD-10-CM

## 2016-11-12 DIAGNOSIS — M79661 Pain in right lower leg: Secondary | ICD-10-CM | POA: Diagnosis not present

## 2016-11-12 MED ORDER — LIDOCAINE HCL 1 % IJ SOLN
3.0000 mL | INTRAMUSCULAR | Status: AC | PRN
  Administered 2016-11-12: 3 mL

## 2016-11-12 MED ORDER — METHYLPREDNISOLONE ACETATE 40 MG/ML IJ SUSP
40.0000 mg | INTRAMUSCULAR | Status: AC | PRN
  Administered 2016-11-12: 40 mg via INTRA_ARTICULAR

## 2016-11-12 NOTE — Progress Notes (Signed)
Office Visit Note   Patient: Zoe [name]           Date of Birth: [birth year]           MRN: [id number] Visit Date: 11/12/2016              Requested by: Sinda Du, MD [address], [place] [address] PCP: Sinda Du, MD   Assessment & Plan: Visit Diagnoses:  1. Presence of right artificial hip joint   2. Acute pain of right knee   3. Pain in right shin     Plan: See her back in 1 week to see what type of response she had to the injection.he is activities as tolerated right leg.  Follow-Up Instructions: Return in about 1 week (around 11/19/2016).   Orders:  Orders Placed This Encounter  Procedures  . Large Joint Injection/Arthrocentesis  . XR HIP UNILAT W OR W/O PELVIS 1V RIGHT  . XR Knee 1-2 Views Right  . XR Tibia/Fibula Right   No orders of the defined types were placed in this encounter.     Procedures: Large Joint Inj Date/Time: 11/12/2016 2:37 PM Performed by: Pete Pelt Authorized by: Pete Pelt   Consent Given by:  Patient Indications:  Pain Location:  Knee Site:  R knee Needle Size:  22 G Approach:  Anterolateral Ultrasound Guidance: No   Fluoroscopic Guidance: No   Arthrogram: No   Medications:  40 mg methylPREDNISolone acetate 40 MG/ML; 3 mL lidocaine 1 % Aspiration Attempted: No   Patient tolerance:  Patient tolerated the procedure well with no immediate complications     Clinical Data: No additional findings.   Subjective: Chief Complaint  Patient presents with  . Right Knee - Pain  . Right Leg - Pain  . 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 She also wants her incision checked where she has a knot status post right total hip arthroplasty. Recently back from a trip to [place] ,[place] on the way back to [place] developed swelling in her  right lower leg. Saw her PCP who ordered a doppler on 11/05/16 which was negative for DVT.   Review of Systems Denies fevers chills shortness breath chest pain.  Objective: Vital Signs: There were no vitals taken for this visit.  Physical Exam  Constitutional: She is oriented to person, place, and time. She appears well-developed and well-nourished. No distress.  Pulmonary/Chest: Effort normal.  Neurological: She is alert and oriented to person, place, and time.  Skin: She is not diaphoretic.  Psychiatric: She has a normal mood and affect.    Ortho Exam Right hip good range of motion without pain. Right hip incisions well-healed. She still has a small mobile mass that is unchanged in size distal portion of the lateral aspect of the surgical incision. No signs of infection about the hip incision. Right knee she has no effusion abnormal warmth or erythema. She has tenderness over the medial joint line. Tenderness in the popliteal region. She has tenderness right calf. Also tenderness over the proximal tib-fib. Homans test negative. No instability with valgus pressure dressing and knee. Specialty Comments:  No specialty comments available.  Imaging: Xr Hip Unilat W Or W/o Pelvis 1v Right  Result Date: 11/12/2016 Ap pelvis and right lateral hip: Status post bilateral hip arthroplasties  without hardware failure  or evidence of loosening, No acute fractures.   Xr Knee 1-2 Views Right  Result Date: 11/12/2016 Ap and lateral right knee: No acute fracture . Knee well maintained all three compartments.   Xr Tibia/fibula Right  Result Date: 11/12/2016 2 views right tib/fib: No acute fracture or bony abnormalities.     PMFS History: Patient Active Problem List   Diagnosis Date Noted  . History of colonic polyps 10/22/2016  . SBO (small bowel obstruction) ([place]) 03/02/2013  . Hyperlipidemia 12/08/2012  . Gout 12/08/2012  . Dysrhythmia 12/08/2012  . Depression 12/08/2012  . Asthma  12/08/2012  . Fibromyalgia 12/08/2012  . Constipation 12/08/2012  . Acute posthemorrhagic anemia 12/07/2012  . Essential hypertension, benign 12/07/2012  . Degenerative arthritis of hip, left 11/05/2012  . Arthritis pain of hip 09/14/2012  . Weakness of right leg 03/24/2011  . OA (osteoarthritis) 03/24/2011  . Stiffness of cervical spine 03/24/2011  . Chronic constipation 11/19/2010  . GERD (gastroesophageal reflux disease) 11/19/2010  . SPINAL STENOSIS 04/18/2009  . HIP PAIN 11/17/2007  . DEGEN LUMBAR/LUMBOSACRAL INTERVERTEBRAL DISC 11/17/2007  . ANSERINE BURSITIS, RIGHT 08/11/2007  . SCIATICA 06/22/2007  . KNEE PAIN 03/17/2007   Past Medical History:  Diagnosis Date  . Abdominal discomfort 11-02-12   suspected ?UTI- PCP MD placed on Cipro  . Anxiety   . Arthritis   . Asthma   . Cancer ([place])    skin CA  . Chronic constipation   . Cough   . Depression   . Dysrhythmia    tachycardia  . Environmental allergies   . Fibromyalgia   . GERD (gastroesophageal reflux disease)   . Hemorrhoids   . High cholesterol   . Hypercholesteremia   . Hypertension   . Nausea   . Pneumonia    7+ YRS  . Urgency of urination     Family History  Problem Relation Age of Onset  . Healthy Son   . Healthy Son   . Healthy Daughter     Past Surgical History:  Procedure Laterality Date  . ANAL RECTAL MANOMETRY N/A 11/28/2013   Procedure: ANO RECTAL MANOMETRY;  Surgeon: Leighton Ruff, MD;  [facility];  Service: Endoscopy;  Laterality: N/A;  . APPENDECTOMY    . BACK SURGERY     X2   . CHOLECYSTECTOMY    . COLONOSCOPY  03/29/03  . COLONOSCOPY N/A 12/09/2013   Procedure: COLONOSCOPY;  Surgeon: Rogene Houston, MD;  [facility];  Service: Endoscopy;  Laterality: N/A;  730  . EYE SURGERY     bilateral cataract removal  . SKIN CANCER EXCISION     RIGHT NECK    . TONSILLECTOMY     T+A  . TOTAL HIP ARTHROPLASTY Right 09/14/2012   Procedure: RIGHT TOTAL HIP ARTHROPLASTY  ANTERIOR APPROACH and RIGHT KNEE STEROID INJECTION;  Surgeon: Mcarthur Rossetti, MD;  Location: [facility];  Service: Orthopedics;  Laterality: Right;  . TOTAL HIP ARTHROPLASTY Left 11/05/2012   Procedure: LEFT TOTAL HIP ARTHROPLASTY ANTERIOR APPROACH;  Surgeon: Mcarthur Rossetti, MD;  [facility];  Service: Orthopedics;  Laterality: Left;  . UMBILICAL HERNIA REPAIR  04/26/03   JENKINS  . UPPER GASTROINTESTINAL ENDOSCOPY  08/03/2009   NUR  . UPPER GASTROINTESTINAL ENDOSCOPY  03/29/2003   [facility] ED TCS   Social History   Occupational History  . Not on file.   Social History Main Topics  . Smoking status: Current Some Day Smoker    Packs/day: 0.25  Years: 50.00    Types: Cigarettes  . Smokeless tobacco: Never Used     Comment: Hasn't smoked in 5-6 weeks  . Alcohol use 0.0 oz/week     Comment: rare  . Drug use: No  . Sexual activity: Not Currently    Birth control/ protection: Post-menopausal

## 2016-11-19 ENCOUNTER — Ambulatory Visit (INDEPENDENT_AMBULATORY_CARE_PROVIDER_SITE_OTHER): Payer: PPO | Admitting: Physician Assistant

## 2016-11-19 DIAGNOSIS — R2241 Localized swelling, mass and lump, right lower limb: Secondary | ICD-10-CM | POA: Diagnosis not present

## 2016-11-19 NOTE — Progress Notes (Signed)
Zoe [name] returns today status post right knee injection.  She states the injection in her knee helped.  Her main complaint today though his right groin pain where she has a small mobile mass that is painful to touch.  She said no fevers chills.  No redness around the incision site.  She has no pain in the right hip otherwise with motion.  She is complaining of some pain down the lateral aspect of her hip is painful to touch over the IT band area.  Physical exam right hip she has a small less than 2 mm x 2 mm mass is mobile but very tender distal incision area and slightly lateral.  The incision otherwise is well-healed.  No signs of infection.  Good range of motion of the right hip without pain.  She does have tenderness down the entire IT band.  Impression: #1 right hip painful mass possible retained suture versus scar tissue. #2 iliotibial band syndrome  Plan: Due to patient's long-term pain involving this small mobile mass despite time and scar tissue mobilization without resolve of her pain recommend removal of the mass.  Questions encouraged and answered by Dr. Delilah [name] self.  Risk benefits discussed.  This will be done as an outpatient procedure.  In regards to her IT band syndrome IT band stretching exercises were shown.  Offered formal physical therapy she defers.  She will follow-up 2 weeks postop.

## 2016-12-03 ENCOUNTER — Ambulatory Visit: Payer: PPO | Admitting: Urology

## 2016-12-05 ENCOUNTER — Ambulatory Visit: Admit: 2016-12-05 | Payer: PPO | Admitting: Orthopaedic Surgery

## 2016-12-05 SURGERY — FOREIGN BODY REMOVAL ADULT
Anesthesia: Choice | Laterality: Right

## 2016-12-09 DIAGNOSIS — E785 Hyperlipidemia, unspecified: Secondary | ICD-10-CM | POA: Diagnosis not present

## 2016-12-09 DIAGNOSIS — M545 Low back pain: Secondary | ICD-10-CM | POA: Diagnosis not present

## 2016-12-09 DIAGNOSIS — I10 Essential (primary) hypertension: Secondary | ICD-10-CM | POA: Diagnosis not present

## 2016-12-09 DIAGNOSIS — J449 Chronic obstructive pulmonary disease, unspecified: Secondary | ICD-10-CM | POA: Diagnosis not present

## 2016-12-11 ENCOUNTER — Inpatient Hospital Stay (INDEPENDENT_AMBULATORY_CARE_PROVIDER_SITE_OTHER): Payer: PPO | Admitting: Physician Assistant

## 2016-12-16 ENCOUNTER — Ambulatory Visit (INDEPENDENT_AMBULATORY_CARE_PROVIDER_SITE_OTHER): Payer: PPO | Admitting: Internal Medicine

## 2016-12-29 ENCOUNTER — Ambulatory Visit (INDEPENDENT_AMBULATORY_CARE_PROVIDER_SITE_OTHER): Payer: PPO

## 2016-12-29 ENCOUNTER — Encounter (INDEPENDENT_AMBULATORY_CARE_PROVIDER_SITE_OTHER): Payer: Self-pay | Admitting: Physician Assistant

## 2016-12-29 ENCOUNTER — Ambulatory Visit (INDEPENDENT_AMBULATORY_CARE_PROVIDER_SITE_OTHER): Payer: PPO | Admitting: Physician Assistant

## 2016-12-29 ENCOUNTER — Telehealth (INDEPENDENT_AMBULATORY_CARE_PROVIDER_SITE_OTHER): Payer: Self-pay | Admitting: Orthopaedic Surgery

## 2016-12-29 VITALS — Ht 67.0 in | Wt 180.0 lb

## 2016-12-29 DIAGNOSIS — M545 Low back pain: Secondary | ICD-10-CM | POA: Diagnosis not present

## 2016-12-29 MED ORDER — CELECOXIB 200 MG PO CAPS: 200.0000 mg | ORAL_CAPSULE | Freq: Every day | ORAL | 1 refills | Status: DC

## 2016-12-29 MED ORDER — TRAMADOL HCL 50 MG PO TABS: 50.0000 mg | ORAL_TABLET | Freq: Two times a day (BID) | ORAL | 0 refills | Status: DC | PRN

## 2016-12-29 NOTE — Telephone Encounter (Signed)
Needs diagnosis code in order to fill RX. Please call Pharmacist back [phone]

## 2016-12-29 NOTE — Progress Notes (Signed)
Office Visit Note   Patient: Zoe [name]           Date of Birth: [birth year]           MRN: [id number] Visit Date: 12/29/2016              Requested by: Sinda Du, MD [place] [place], [place] [address] PCP: Sinda Du, MD   Assessment & Plan: Visit Diagnoses:  1. Acute right-sided low back pain, with sciatica presence unspecified     Plan: Offered formal physical therapy for her back she defers.  Therefore she is given home exercises for her back.  Also placed her on Celebrex no other NSAIDs.  Celebrex was chosen due to patient's GI upset with naproxen.  She was not given a prednisone Dosepak due to the fact that she has an upper respiratory infection or cold.  She will follow-up with [place] in 1 month.  However if her symptoms become worse or do not respond to the home exercises and Celebrex she can call the office and we will obtain a new MRI of her lumbar spine.  Follow-Up Instructions: Return in about 4 weeks (around 01/26/2017).   Orders:  Orders Placed This Encounter  Procedures  . XR Lumbar Spine 2-3 Views   Meds ordered this encounter  Medications  . celecoxib (CELEBREX) 200 MG capsule    Sig: Take 1 capsule (200 mg total) by mouth daily.    Dispense:  30 capsule    Refill:  1  . traMADol (ULTRAM) 50 MG tablet    Sig: Take 1 tablet (50 mg total) by mouth every 12 (twelve) hours as needed for moderate pain.    Dispense:  30 tablet    Refill:  0      Procedures: No procedures performed   Clinical Data: No additional findings.   Subjective: Chief Complaint  Patient presents with  . Lower Back - Pain    HPI Mrs. Mies due to low back pain for 3 weeks with radicular symptoms down the right leg mainly lateral and anterior thigh.  She describes the pain as a throbbing electrical pain in the thigh.  She has had no bowel bladder dysfunction no waking pain.  She is tried Tylenol tramadol and heat with some relief.  She has  fortunately has either upper respiratory infection or cold going on is wearing a mask today.  She denies any fevers chills.  MRI of the lumbar spine in 2014 showed  L5-S1 facet arthrosis with moderate left foraminal stenosis and a disc protrusion with mild left lateral canal narrowing.  Degenerative changes of the upper lumbar were noted but no areas of nerve compression were noted.  She has had no new injury to her lumbar spine. Review of Systems See HPI otherwise negative  Objective: Vital Signs: Ht 5\' 7"  (1.702 m)   Wt 180 lb (81.6 kg)   BMI 28.19 kg/m   Physical Exam  Constitutional: She is oriented to person, place, and time. She appears well-developed and well-nourished. No distress.  Cardiovascular: Intact distal pulses.  Pulmonary/Chest: Effort normal.  Neurological: She is alert and oriented to person, place, and time.  Skin: She is not diaphoretic.  Psychiatric: She has a normal mood and affect.    Ortho Exam Lower extremities 5 out of 5 strength throughout against resistance.  Positive straight leg raise on the right negative on the left.  Good range of motion of bilateral hips without pain.  Tenderness right lower lumbar paraspinous region. Specialty Comments:  No specialty comments available.  Imaging: Xr Lumbar Spine 2-3 Views  Result Date: 12/29/2016 Lumbar spine AP lateral views: No acute fracture.  Degenerative disc disease L1-L2 3 and L3-4.  Endplate spurring at the upper lumbar vertebrae.  No spondylolisthesis.    PMFS History: Patient Active Problem List   Diagnosis Date Noted  . History of colonic polyps 10/22/2016  . SBO (small bowel obstruction) ([place]) 03/02/2013  . Hyperlipidemia 12/08/2012  . Gout 12/08/2012  . Dysrhythmia 12/08/2012  . Depression 12/08/2012  . Asthma 12/08/2012  . Fibromyalgia 12/08/2012  . Constipation 12/08/2012  . Acute posthemorrhagic anemia 12/07/2012  . Essential hypertension, benign 12/07/2012  . Degenerative arthritis of  hip, left 11/05/2012  . Arthritis pain of hip 09/14/2012  . Weakness of right leg 03/24/2011  . OA (osteoarthritis) 03/24/2011  . Stiffness of cervical spine 03/24/2011  . Chronic constipation 11/19/2010  . GERD (gastroesophageal reflux disease) 11/19/2010  . SPINAL STENOSIS 04/18/2009  . HIP PAIN 11/17/2007  . DEGEN LUMBAR/LUMBOSACRAL INTERVERTEBRAL DISC 11/17/2007  . ANSERINE BURSITIS, RIGHT 08/11/2007  . SCIATICA 06/22/2007  . KNEE PAIN 03/17/2007   Past Medical History:  Diagnosis Date  . Abdominal discomfort 11-02-12   suspected ?UTI- PCP MD placed on Cipro  . Anxiety   . Arthritis   . Asthma   . Cancer ([place])    skin CA  . Chronic constipation   . Cough   . Depression   . Dysrhythmia    tachycardia  . Environmental allergies   . Fibromyalgia   . GERD (gastroesophageal reflux disease)   . Hemorrhoids   . High cholesterol   . Hypercholesteremia   . Hypertension   . Nausea   . Pneumonia    7+ YRS  . Urgency of urination     Family History  Problem Relation Age of Onset  . Healthy Son   . Healthy Son   . Healthy Daughter     Past Surgical History:  Procedure Laterality Date  . ANAL RECTAL MANOMETRY N/A 11/28/2013   Procedure: ANO RECTAL MANOMETRY;  Surgeon: Leighton Ruff, MD;  [facility];  Service: Endoscopy;  Laterality: N/A;  . APPENDECTOMY    . BACK SURGERY     X2   . CHOLECYSTECTOMY    . COLONOSCOPY  03/29/03  . COLONOSCOPY N/A 12/09/2013   Procedure: COLONOSCOPY;  Surgeon: Rogene Houston, MD;  [facility];  Service: Endoscopy;  Laterality: N/A;  730  . EYE SURGERY     bilateral cataract removal  . SKIN CANCER EXCISION     RIGHT NECK    . TONSILLECTOMY     T+A  . TOTAL HIP ARTHROPLASTY Right 09/14/2012   Procedure: RIGHT TOTAL HIP ARTHROPLASTY ANTERIOR APPROACH and RIGHT KNEE STEROID INJECTION;  Surgeon: Mcarthur Rossetti, MD;  Location: [facility];  Service: Orthopedics;  Laterality: Right;  . TOTAL HIP ARTHROPLASTY Left  11/05/2012   Procedure: LEFT TOTAL HIP ARTHROPLASTY ANTERIOR APPROACH;  Surgeon: Mcarthur Rossetti, MD;  [facility];  Service: Orthopedics;  Laterality: Left;  . UMBILICAL HERNIA REPAIR  04/26/03   JENKINS  . UPPER GASTROINTESTINAL ENDOSCOPY  08/03/2009   NUR  . UPPER GASTROINTESTINAL ENDOSCOPY  03/29/2003   [facility] ED TCS   Social History   Occupational History  . Not on file  Tobacco Use  . Smoking status: Current Some Day Smoker    Packs/day: 0.25    Years: 50.00  Pack years: 12.50    Types: Cigarettes  . Smokeless tobacco: Never Used  . Tobacco comment: Hasn't smoked in 5-6 weeks  Substance and Sexual Activity  . Alcohol use: Yes    Alcohol/week: 0.0 oz    Comment: rare  . Drug use: No  . Sexual activity: Not Currently    Birth control/protection: Post-menopausal

## 2016-12-30 ENCOUNTER — Encounter (INDEPENDENT_AMBULATORY_CARE_PROVIDER_SITE_OTHER): Payer: Self-pay | Admitting: *Deleted

## 2016-12-30 ENCOUNTER — Telehealth (INDEPENDENT_AMBULATORY_CARE_PROVIDER_SITE_OTHER): Payer: Self-pay | Admitting: *Deleted

## 2016-12-30 MED ORDER — PEG 3350-KCL-NA BICARB-NACL 420 G PO SOLR: 4000.0000 mL | Freq: Once | ORAL | 0 refills | Status: AC

## 2016-12-30 NOTE — Telephone Encounter (Signed)
IC pharm and LMVM and advised

## 2016-12-30 NOTE — Telephone Encounter (Signed)
Patient needs trilyte 

## 2017-01-02 DIAGNOSIS — J40 Bronchitis, not specified as acute or chronic: Secondary | ICD-10-CM | POA: Diagnosis not present

## 2017-01-02 DIAGNOSIS — R05 Cough: Secondary | ICD-10-CM | POA: Diagnosis not present

## 2017-01-16 DIAGNOSIS — R05 Cough: Secondary | ICD-10-CM | POA: Diagnosis not present

## 2017-01-16 DIAGNOSIS — J40 Bronchitis, not specified as acute or chronic: Secondary | ICD-10-CM | POA: Diagnosis not present

## 2017-01-29 ENCOUNTER — Encounter (INDEPENDENT_AMBULATORY_CARE_PROVIDER_SITE_OTHER): Payer: Self-pay | Admitting: Physician Assistant

## 2017-01-29 ENCOUNTER — Ambulatory Visit (INDEPENDENT_AMBULATORY_CARE_PROVIDER_SITE_OTHER): Payer: PPO | Admitting: Physician Assistant

## 2017-01-29 DIAGNOSIS — R2241 Localized swelling, mass and lump, right lower limb: Secondary | ICD-10-CM

## 2017-01-29 DIAGNOSIS — M545 Low back pain: Secondary | ICD-10-CM

## 2017-01-29 NOTE — Progress Notes (Signed)
Zoe [name] returns today follow-up of her low back pain.  She states her back pain is much improved.  She is having no radicular symptoms down either leg.  She feels that changing her mattress out has greatly helped with her low back pain.  She has not been vigilant about doing the back exercises that were given to her.  She does ask about the nodule remains painful to touch in her right groin area.  She was scheduled for this to be removed however had to cancel due to transportation issues.  She would like to have this removed sometime in the near future.  Review of systems: Please see HPI otherwise negative  Physical exam: Negative straight leg raise bilaterally.  Good range of motion of both hips without pain.  Impression: Low back pain resolved   Right hip painful mass differential retained suture versus scar tissue  Plan: Recommended that she continue to work on stretching and also on back exercises shown.  In regards to the right hip mass she will call Samella Parr and set up excision of this mass in the near future.  Questions were encouraged and answered at length.  Patient was given Samella Parr business card.

## 2017-02-03 ENCOUNTER — Ambulatory Visit: Payer: PPO | Admitting: Urology

## 2017-02-03 DIAGNOSIS — R3915 Urgency of urination: Secondary | ICD-10-CM | POA: Diagnosis not present

## 2017-02-03 DIAGNOSIS — R351 Nocturia: Secondary | ICD-10-CM | POA: Diagnosis not present

## 2017-02-03 DIAGNOSIS — R350 Frequency of micturition: Secondary | ICD-10-CM | POA: Diagnosis not present

## 2017-02-04 ENCOUNTER — Ambulatory Visit (HOSPITAL_COMMUNITY)
Admission: RE | Admit: 2017-02-04 | Discharge: 2017-02-04 | Disposition: A | Discharge status: Home / Self Care | Payer: PPO | Source: Ambulatory Visit | Attending: Internal Medicine | Admitting: Internal Medicine

## 2017-02-04 ENCOUNTER — Encounter (HOSPITAL_COMMUNITY)
Admission: RE | Disposition: A | Discharge status: Home / Self Care | Payer: Self-pay | Source: Ambulatory Visit | Attending: Internal Medicine

## 2017-02-04 ENCOUNTER — Other Ambulatory Visit: Payer: Self-pay

## 2017-02-04 ENCOUNTER — Encounter (HOSPITAL_COMMUNITY): Payer: Self-pay | Admitting: *Deleted

## 2017-02-04 DIAGNOSIS — R3915 Urgency of urination: Secondary | ICD-10-CM | POA: Insufficient documentation

## 2017-02-04 DIAGNOSIS — F17210 Nicotine dependence, cigarettes, uncomplicated: Secondary | ICD-10-CM | POA: Diagnosis not present

## 2017-02-04 DIAGNOSIS — F419 Anxiety disorder, unspecified: Secondary | ICD-10-CM | POA: Insufficient documentation

## 2017-02-04 DIAGNOSIS — J45909 Unspecified asthma, uncomplicated: Secondary | ICD-10-CM | POA: Insufficient documentation

## 2017-02-04 DIAGNOSIS — Z86010 Personal history of colonic polyps: Secondary | ICD-10-CM | POA: Diagnosis not present

## 2017-02-04 DIAGNOSIS — Z1211 Encounter for screening for malignant neoplasm of colon: Secondary | ICD-10-CM | POA: Insufficient documentation

## 2017-02-04 DIAGNOSIS — R000 Tachycardia, unspecified: Secondary | ICD-10-CM | POA: Diagnosis not present

## 2017-02-04 DIAGNOSIS — Z9049 Acquired absence of other specified parts of digestive tract: Secondary | ICD-10-CM | POA: Insufficient documentation

## 2017-02-04 DIAGNOSIS — Z79899 Other long term (current) drug therapy: Secondary | ICD-10-CM | POA: Diagnosis not present

## 2017-02-04 DIAGNOSIS — K219 Gastro-esophageal reflux disease without esophagitis: Secondary | ICD-10-CM | POA: Insufficient documentation

## 2017-02-04 DIAGNOSIS — E7800 Pure hypercholesterolemia, unspecified: Secondary | ICD-10-CM | POA: Insufficient documentation

## 2017-02-04 DIAGNOSIS — Z7951 Long term (current) use of inhaled steroids: Secondary | ICD-10-CM | POA: Insufficient documentation

## 2017-02-04 DIAGNOSIS — K6389 Other specified diseases of intestine: Secondary | ICD-10-CM | POA: Insufficient documentation

## 2017-02-04 DIAGNOSIS — M1990 Unspecified osteoarthritis, unspecified site: Secondary | ICD-10-CM | POA: Insufficient documentation

## 2017-02-04 DIAGNOSIS — F329 Major depressive disorder, single episode, unspecified: Secondary | ICD-10-CM | POA: Diagnosis not present

## 2017-02-04 DIAGNOSIS — M797 Fibromyalgia: Secondary | ICD-10-CM | POA: Insufficient documentation

## 2017-02-04 DIAGNOSIS — Z7982 Long term (current) use of aspirin: Secondary | ICD-10-CM | POA: Insufficient documentation

## 2017-02-04 DIAGNOSIS — Z885 Allergy status to narcotic agent status: Secondary | ICD-10-CM | POA: Insufficient documentation

## 2017-02-04 DIAGNOSIS — I10 Essential (primary) hypertension: Secondary | ICD-10-CM | POA: Diagnosis not present

## 2017-02-04 DIAGNOSIS — Z85828 Personal history of other malignant neoplasm of skin: Secondary | ICD-10-CM | POA: Insufficient documentation

## 2017-02-04 DIAGNOSIS — Z09 Encounter for follow-up examination after completed treatment for conditions other than malignant neoplasm: Secondary | ICD-10-CM | POA: Diagnosis not present

## 2017-02-04 DIAGNOSIS — K644 Residual hemorrhoidal skin tags: Secondary | ICD-10-CM | POA: Diagnosis not present

## 2017-02-04 DIAGNOSIS — Z96643 Presence of artificial hip joint, bilateral: Secondary | ICD-10-CM | POA: Diagnosis not present

## 2017-02-04 DIAGNOSIS — Z888 Allergy status to other drugs, medicaments and biological substances status: Secondary | ICD-10-CM | POA: Diagnosis not present

## 2017-02-04 DIAGNOSIS — K5909 Other constipation: Secondary | ICD-10-CM | POA: Insufficient documentation

## 2017-02-04 HISTORY — PX: COLONOSCOPY: SHX5424

## 2017-02-04 HISTORY — DX: Constipation, unspecified: K59.00

## 2017-02-04 SURGERY — COLONOSCOPY
Anesthesia: Moderate Sedation

## 2017-02-04 MED ORDER — MIDAZOLAM HCL 5 MG/5ML IJ SOLN
INTRAMUSCULAR | Status: AC
  Filled 2017-02-04: qty 10

## 2017-02-04 MED ORDER — MIDAZOLAM HCL 5 MG/5ML IJ SOLN
INTRAMUSCULAR | Status: DC | PRN
  Administered 2017-02-04 (×2): 2 mg via INTRAVENOUS
  Administered 2017-02-04: 1 mg via INTRAVENOUS
  Administered 2017-02-04: 2 mg via INTRAVENOUS

## 2017-02-04 MED ORDER — STERILE WATER FOR IRRIGATION IR SOLN
Status: DC | PRN
  Administered 2017-02-04: 10:00:00

## 2017-02-04 MED ORDER — MEPERIDINE HCL 50 MG/ML IJ SOLN
INTRAMUSCULAR | Status: AC
  Filled 2017-02-04: qty 1

## 2017-02-04 MED ORDER — SODIUM CHLORIDE 0.9 % IV SOLN
INTRAVENOUS | Status: DC
  Administered 2017-02-04: 09:00:00 via INTRAVENOUS

## 2017-02-04 MED ORDER — MEPERIDINE HCL 50 MG/ML IJ SOLN
INTRAMUSCULAR | Status: DC | PRN
  Administered 2017-02-04 (×2): 25 mg via INTRAVENOUS

## 2017-02-04 NOTE — Discharge Instructions (Signed)
Resume usual medications and diet as before. °No driving for 24 hours. ° °Colonoscopy, Adult, Care After °This sheet gives you information about how to care for yourself after your procedure. Your doctor may also give you more specific instructions. If you have problems or questions, call your doctor. °Follow these instructions at home: °General instructions ° °· For the first 24 hours after the procedure: °? Do not drive or use machinery. °? Do not sign important documents. °? Do not drink alcohol. °? Do your daily activities more slowly than normal. °? Eat foods that are soft and easy to digest. °? Rest often. °· Take over-the-counter or prescription medicines only as told by your doctor. °· It is up to you to get the results of your procedure. Ask your doctor, or the department performing the procedure, when your results will be ready. °To help cramping and bloating: °· Try walking around. °· Put heat on your belly (abdomen) as told by your doctor. Use a heat source that your doctor recommends, such as a moist heat pack or a heating pad. °? Put a towel between your skin and the heat source. °? Leave the heat on for 20-30 minutes. °? Remove the heat if your skin turns bright red. This is especially important if you cannot feel pain, heat, or cold. You can get burned. °Eating and drinking °· Drink enough fluid to keep your pee (urine) clear or pale yellow. °· Return to your normal diet as told by your doctor. Avoid heavy or fried foods that are hard to digest. °· Avoid drinking alcohol for as long as told by your doctor. °Contact a doctor if: °· You have blood in your poop (stool) 2-3 days after the procedure. °Get help right away if: °· You have more than a small amount of blood in your poop. °· You see large clumps of tissue (blood clots) in your poop. °· Your belly is swollen. °· You feel sick to your stomach (nauseous). °· You throw up (vomit). °· You have a fever. °· You have belly pain that gets worse, and  medicine does not help your pain. °This information is not intended to replace advice given to you by your health care provider. Make sure you discuss any questions you have with your health care provider. °Document Released: 02/15/2010 Document Revised: 10/08/2015 Document Reviewed: 10/08/2015 °Elsevier Interactive Patient Education © 2017 [employer] ° °

## 2017-02-04 NOTE — H&P (Signed)
Zoe [name] is an 82 y.o. female.   Chief Complaint: Patient is here for colonoscopy. HPI: Who has a history of colonic adenomas.  Last exam was in November 2015 with removal of 3 tubular adenomas 1 of which was flat and large.  She denies rectal bleeding or melena.  She has chronic constipation and is dependent on laxatives.  She is having some results with Amitiza. Family history is negative for CRC.  Past Medical History:  Diagnosis Date  .       Marland Kitchen Anxiety   . Arthritis   . Asthma   . Cancer ([place])    skin CA  . Chronic constipation   . Constipation   . Cough   . Depression   . Dysrhythmia    tachycardia  . Environmental allergies   . Fibromyalgia   . GERD (gastroesophageal reflux disease)   . Hemorrhoids   . High cholesterol   . Hypercholesteremia   . Hypertension   . Nausea   . Pneumonia    7+ YRS  . Urgency of urination     Past Surgical History:  Procedure Laterality Date  . ANAL RECTAL MANOMETRY N/A 11/28/2013   Procedure: ANO RECTAL MANOMETRY;  Surgeon: Leighton Ruff, MD;  Location: [facility];  Service: Endoscopy;  Laterality: N/A;  . APPENDECTOMY    . BACK SURGERY     X2   . CHOLECYSTECTOMY    . COLONOSCOPY  03/29/03  . COLONOSCOPY N/A 12/09/2013   Procedure: COLONOSCOPY;  Surgeon: Rogene Houston, MD;  Location: [facility];  Service: Endoscopy;  Laterality: N/A;  730  . EYE SURGERY     bilateral cataract removal  . SKIN CANCER EXCISION     RIGHT NECK    . TONSILLECTOMY     T+A  . TOTAL HIP ARTHROPLASTY Right 09/14/2012   Procedure: RIGHT TOTAL HIP ARTHROPLASTY ANTERIOR APPROACH and RIGHT KNEE STEROID INJECTION;  Surgeon: Mcarthur Rossetti, MD;  Location: [facility];  Service: Orthopedics;  Laterality: Right;  . TOTAL HIP ARTHROPLASTY Left 11/05/2012   Procedure: LEFT TOTAL HIP ARTHROPLASTY ANTERIOR APPROACH;  Surgeon: Mcarthur Rossetti, MD;  Location: [facility];  Service: Orthopedics;  Laterality: Left;  . UMBILICAL HERNIA REPAIR  04/26/03   JENKINS  . UPPER GASTROINTESTINAL ENDOSCOPY  08/03/2009   NUR  . UPPER GASTROINTESTINAL ENDOSCOPY  03/29/2003   EGD ED TCS    Family History  Problem Relation Age of Onset  . Healthy Son   . Healthy Son   . Healthy Daughter   . Colon cancer Neg Hx    Social History:  reports that she has been smoking cigarettes.  She has a 12.50 pack-year smoking history. she has never used smokeless tobacco. She reports that she drinks alcohol. She reports that she does not use drugs.  Allergies:  Allergies  Allergen Reactions  . Codeine Other (See Comments)    Pains in abdominal area  . Escitalopram Itching  . Other     NOVACAINE     ITCHING  . Oxycodone     Abdominal pain  . Paxil [Paroxetine Hcl] Itching  . Procaine Itching    Medications Prior to Admission  Medication Sig Dispense Refill  . albuterol (PROVENTIL HFA;VENTOLIN HFA) 108 (90 BASE) MCG/ACT inhaler Inhale 2 puffs into the lungs every 6 (six) hours as needed for wheezing or shortness of breath.    . allopurinol (ZYLOPRIM) 300 MG tablet Take 300 mg by mouth daily.    Marland Kitchen ALPRAZolam (  XANAX) 0.5 MG tablet Take 0.5 mg by mouth as needed for anxiety.     Marland Kitchen amLODipine (NORVASC) 2.5 MG tablet Take 2.5 mg by mouth every other day. In the am    . aspirin 81 MG tablet Take 81 mg by mouth daily.    . Biotin 5000 MCG CAPS Take 5,000 mcg by mouth daily.    . digoxin (LANOXIN) 0.25 MG tablet Take 0.25 mg by mouth every other day.     Marland Kitchen doxepin (SINEQUAN) 100 MG capsule Take 100 mg by mouth at bedtime.     . fexofenadine (ALLEGRA) 60 MG tablet Take 30 mg by mouth daily.     . fluticasone-salmeterol (ADVAIR HFA) 115-21 MCG/ACT inhaler Inhale 1 puff into the lungs 2 (two) times daily.    Marland Kitchen gabapentin (NEURONTIN) 300 MG capsule Take 300 mg by mouth 2 (two) times daily.     Marland Kitchen lubiprostone (AMITIZA) 24 MCG capsule Take 24 mcg by mouth twice daily    . Naphazoline-Glycerin (CLEAR EYES COOLING COMFORT OP) Apply 1 drop to eye daily as needed (blurry  eyes).    . naproxen (NAPROSYN) 500 MG tablet Take 250 mg by mouth 2 (two) times daily with a meal.     . omeprazole (PRILOSEC) 20 MG capsule Take 20 mg by mouth every morning.    . pravastatin (PRAVACHOL) 20 MG tablet Take 20 mg by mouth every other day.     . quiNINE (QUALAQUIN) 324 MG capsule Take 324 mg by mouth at bedtime.     . senna (SENOKOT) 8.6 MG tablet Take 5 tablets (43 mg total) by mouth daily. (Patient taking differently: Take 10 tablets by mouth at bedtime. )    . sertraline (ZOLOFT) 25 MG tablet Take 12.5 mg by mouth daily.    Marland Kitchen albuterol (PROVENTIL) (2.5 MG/3ML) 0.083% nebulizer solution Take 2.5 mg by nebulization 2 (two) times daily as needed for wheezing or shortness of breath.    . celecoxib (CELEBREX) 200 MG capsule Take 1 capsule (200 mg total) by mouth daily. (Patient not taking: Reported on 01/26/2017) 30 capsule 1  . Plecanatide (TRULANCE) 3 MG TABS Take 3 mg by mouth daily with breakfast. (Patient not taking: Reported on 11/19/2016) 30 tablet 5  . traMADol (ULTRAM) 50 MG tablet Take 1 tablet (50 mg total) by mouth every 12 (twelve) hours as needed for moderate pain. (Patient not taking: Reported on 01/26/2017) 30 tablet 0    No results found for this or any previous visit (from the past 48 hour(s)). No results found.  ROS  Blood pressure 139/80, pulse (!) 58, temperature 97.7 F (36.5 C), temperature source Oral, resp. rate 16, height 5\' 7"  (1.702 m), weight 177 lb (80.3 kg), SpO2 95 %. Physical Exam  Constitutional: She appears well-developed and well-nourished.  HENT:  Mouth/Throat: Oropharynx is clear and moist.  Eyes: Conjunctivae are normal. No scleral icterus.  Neck: No thyromegaly present.  Cardiovascular: Normal rate, regular rhythm and normal heart sounds.  No murmur heard. Respiratory: Effort normal and breath sounds normal.  GI:  Vertical right subcostal scar and an appendectomy scar.  Abdomen is full but soft and nontender without organomegaly or  masses.  Musculoskeletal: She exhibits no edema.  Lymphadenopathy:    She has no cervical adenopathy.  Neurological: She is alert.  Skin: Skin is warm and dry.     Assessment/Plan History of colonic adenomas. Surveillance colonoscopy.  Hildred Laser, MD 02/04/2017, 9:29 AM

## 2017-02-04 NOTE — Op Note (Signed)
[facility] Patient Name: Zoe [name] Procedure Date: 02/04/2017 8:55 AM MRN: [id number] Date of Birth: 10/06/1935 Attending MD: Hildred Laser , MD CSN: [id number] Age: 82 Admit Type: Outpatient Procedure:                Colonoscopy Indications:              High risk colon cancer surveillance: Personal                            history of colonic polyps Providers:                Hildred Laser, MD, Otis Peak B. Sharon Seller, RN, Aram Candela Referring MD:             Jasper Loser. Luan Pulling, MD Medicines:                Meperidine 50 mg IV, Midazolam 7 mg IV Complications:            No immediate complications. Estimated Blood Loss:     Estimated blood loss: none. Procedure:                Pre-Anesthesia Assessment:                           - Prior to the procedure, a History and Physical                            was performed, and patient medications and                            allergies were reviewed. The patient's tolerance of                            previous anesthesia was also reviewed. The risks                            and benefits of the procedure and the sedation                            options and risks were discussed with the patient.                            All questions were answered, and informed consent                            was obtained. Prior Anticoagulants: The patient                            last took aspirin 1 day prior to the procedure and                            last took naproxen on the day of the procedure. ASA  Grade Assessment: II - A patient with mild systemic                            disease. After reviewing the risks and benefits,                            the patient was deemed in satisfactory condition to                            undergo the procedure.                           After obtaining informed consent, the colonoscope                            was passed under  direct vision. Throughout the                            procedure, the patient's blood pressure, pulse, and                            oxygen saturations were monitored continuously. The                            [id number] ([id number]) scope was introduced through                            the anus and advanced to the the cecum, identified                            by appendiceal orifice and ileocecal valve. The                            colonoscopy was technically difficult and complex                            due to a tortuous colon. Successful completion of                            the procedure was aided by changing the patient's                            position, using manual pressure and withdrawing and                            reinserting the scope. The patient tolerated the                            procedure well. The quality of the bowel                            preparation was fair. The ileocecal valve,  appendiceal orifice, and rectum were photographed. Scope In: 9:42:33 AM Scope Out: 10:18:48 AM Scope Withdrawal Time: 0 hours 15 minutes 55 seconds  Total Procedure Duration: 0 hours 36 minutes 15 seconds  Findings:      The perianal and digital rectal examinations were normal.      A diffuse area of mild melanosis was found in the entire colon.      The exam was otherwise normal throughout the examined colon.      External hemorrhoids were found during retroflexion. The hemorrhoids       were small. Impression:               - Preparation of the colon was fair.                           - Melanosis in the colon.                           - External hemorrhoids.                           - No specimens collected. Moderate Sedation:      Moderate (conscious) sedation was administered by the endoscopy nurse       and supervised by the endoscopist. The following parameters were       monitored: oxygen saturation, heart rate, blood  pressure, CO2       capnography and response to care. Total physician intraservice time was       43 minutes. Recommendation:           - Patient has a contact number available for                            emergencies. The signs and symptoms of potential                            delayed complications were discussed with the                            patient. Return to normal activities tomorrow.                            Written discharge instructions were provided to the                            patient.                           - High fiber diet today.                           - Continue present medications.                           - No recommendation at this time regarding repeat                            colonoscopy. Procedure Code(s):        --- Professional ---  [phone], Colonoscopy, flexible; diagnostic, including                            collection of specimen(s) by brushing or washing,                            when performed (separate procedure)                           99152, Moderate sedation services provided by the                            same physician or other qualified health care                            professional performing the diagnostic or                            therapeutic service that the sedation supports,                            requiring the presence of an independent trained                            observer to assist in the monitoring of the                            patient's level of consciousness and physiological                            status; initial 15 minutes of intraservice time,                            patient age 102 years or older                           [phone], Moderate sedation services; each additional                            15 minutes intraservice time                           99153, Moderate sedation services; each additional                            15 minutes intraservice  time Diagnosis Code(s):        --- Professional ---                           Z86.010, Personal history of colonic polyps                           K64.4, Residual hemorrhoidal skin tags                           K63.89,  Other specified diseases of intestine CPT copyright 2016 [employer]. All rights reserved. The codes documented in this report are preliminary and upon coder review may  be revised to meet current compliance requirements. Hildred Laser, MD Hildred Laser, MD 02/04/2017 10:27:57 AM This report has been signed electronically. Number of Addenda: 0

## 2017-02-05 DIAGNOSIS — H532 Diplopia: Secondary | ICD-10-CM | POA: Diagnosis not present

## 2017-02-05 DIAGNOSIS — H40013 Open angle with borderline findings, low risk, bilateral: Secondary | ICD-10-CM | POA: Diagnosis not present

## 2017-02-05 DIAGNOSIS — Z961 Presence of intraocular lens: Secondary | ICD-10-CM | POA: Diagnosis not present

## 2017-02-06 ENCOUNTER — Encounter (HOSPITAL_COMMUNITY): Payer: Self-pay | Admitting: Internal Medicine

## 2017-03-25 ENCOUNTER — Telehealth (INDEPENDENT_AMBULATORY_CARE_PROVIDER_SITE_OTHER): Payer: Self-pay | Admitting: *Deleted

## 2017-03-25 NOTE — Telephone Encounter (Signed)
Patient called requesting samples of amtiza, patient states it will be 400$ with her insurance. Please advise  [phone]

## 2017-03-25 NOTE — Telephone Encounter (Signed)
I advised her we did have any samples

## 2017-04-15 DIAGNOSIS — Z961 Presence of intraocular lens: Secondary | ICD-10-CM | POA: Diagnosis not present

## 2017-04-21 ENCOUNTER — Encounter (INDEPENDENT_AMBULATORY_CARE_PROVIDER_SITE_OTHER): Payer: Self-pay | Admitting: *Deleted

## 2017-04-21 ENCOUNTER — Ambulatory Visit (INDEPENDENT_AMBULATORY_CARE_PROVIDER_SITE_OTHER): Payer: PPO | Admitting: Internal Medicine

## 2017-04-21 ENCOUNTER — Encounter (INDEPENDENT_AMBULATORY_CARE_PROVIDER_SITE_OTHER): Payer: Self-pay | Admitting: Internal Medicine

## 2017-04-21 VITALS — BP 116/72 | HR 68 | Temp 97.6°F | Resp 18 | Ht 67.5 in | Wt 186.6 lb

## 2017-04-21 DIAGNOSIS — R1310 Dysphagia, unspecified: Secondary | ICD-10-CM | POA: Diagnosis not present

## 2017-04-21 DIAGNOSIS — K5909 Other constipation: Secondary | ICD-10-CM | POA: Diagnosis not present

## 2017-04-21 DIAGNOSIS — R1319 Other dysphagia: Secondary | ICD-10-CM

## 2017-04-21 DIAGNOSIS — K219 Gastro-esophageal reflux disease without esophagitis: Secondary | ICD-10-CM | POA: Diagnosis not present

## 2017-04-21 NOTE — Patient Instructions (Addendum)
Please arrange visit with Dr. Barrie Dunker for pelvic exam to rule out rectocele. Physician will call with results of barium pill esophagogram when completed. Decrease sennakot dose by 1 pill every month.

## 2017-04-21 NOTE — Progress Notes (Signed)
Presenting complaint;  Follow-up for GERD and chronic constipation. She complains of dysphagia.  Database and subjective:  Patient is 82 year old Caucasian female who has lifelong constipation with dependence on high-dose OTC laxative whose last colonoscopy was in January this year who is here for scheduled visit.  She was begun on Amitiza with the hope of getting her off Senokot. She is having 3-4 bowel movements per week.  Most of the time she feels is satisfactory evacuation.  She says she has to portion change of position and sometimes push the perineum in order to have a bowel movement.  However she has not been able to drop her senna dose.  On most days she is taking 10 tablets/day.  She has noted that if she does not take Amitiza bowels would not move with senna alone. She is trying to eat fiber rich foods.  She says he rarely has heartburn but she has noted more hoarseness lately.  She also complains of dysphagia to solids.  She points to the suprasternal area.  She also has noted more cough and at times she has noted scant amount of blood in the sputum.  She denies chest pain shortness of breath fever or chills. She has not brought these symptoms to Dr. Luan Pulling his attention. Her appetite is normal and she has not lost any weight. She underwent EGD with esophageal dilation back in March 2005.    Current Medications: Outpatient Encounter Medications as of 04/21/2017  Medication Sig  . albuterol (PROVENTIL HFA;VENTOLIN HFA) 108 (90 BASE) MCG/ACT inhaler Inhale 2 puffs into the lungs every 6 (six) hours as needed for wheezing or shortness of breath.  Marland Kitchen albuterol (PROVENTIL) (2.5 MG/3ML) 0.083% nebulizer solution Take 2.5 mg by nebulization 2 (two) times daily as needed for wheezing or shortness of breath.  . allopurinol (ZYLOPRIM) 300 MG tablet Take 300 mg by mouth daily.  Marland Kitchen ALPRAZolam (XANAX) 0.5 MG tablet Take 0.5 mg by mouth as needed for anxiety.   Marland Kitchen amLODipine (NORVASC) 2.5 MG tablet  Take 2.5 mg by mouth every other day. In the am  . aspirin 81 MG tablet Take 81 mg by mouth daily.  . Biotin 5000 MCG CAPS Take 5,000 mcg by mouth daily.  . digoxin (LANOXIN) 0.25 MG tablet Take 0.25 mg by mouth every other day.   Marland Kitchen doxepin (SINEQUAN) 100 MG capsule Take 100 mg by mouth at bedtime.   . fexofenadine (ALLEGRA) 60 MG tablet Take 30 mg by mouth daily.   . fluticasone-salmeterol (ADVAIR HFA) 115-21 MCG/ACT inhaler Inhale 1 puff into the lungs 2 (two) times daily.  Marland Kitchen gabapentin (NEURONTIN) 300 MG capsule Take 300 mg by mouth 2 (two) times daily.   Marland Kitchen lubiprostone (AMITIZA) 24 MCG capsule Take 24 mcg by mouth twice daily  . naproxen (NAPROSYN) 500 MG tablet Take 250 mg by mouth 2 (two) times daily with a meal.   . omeprazole (PRILOSEC) 20 MG capsule Take 20 mg by mouth every morning.  . pravastatin (PRAVACHOL) 20 MG tablet Take 20 mg by mouth every other day.   . quiNINE (QUALAQUIN) 324 MG capsule Take 324 mg by mouth at bedtime.   . senna (SENOKOT) 8.6 MG tablet Take 5 tablets (43 mg total) by mouth daily. (Patient taking differently: Take 10 tablets by mouth at bedtime. )  . sertraline (ZOLOFT) 25 MG tablet Take 12.5 mg by mouth daily.  . [DISCONTINUED] Naphazoline-Glycerin (CLEAR EYES COOLING COMFORT OP) Apply 1 drop to eye daily as needed (blurry eyes).  No facility-administered encounter medications on file as of 04/21/2017.      Objective: Blood pressure 116/72, pulse 68, temperature 97.6 F (36.4 C), temperature source Oral, resp. rate 18, height 5' 7.5" (1.715 m), weight 186 lb 9.6 oz (84.6 kg). Patient is alert and in no acute distress. Conjunctiva is pink. Sclera is nonicteric Oropharyngeal mucosa is normal. No neck masses or thyromegaly noted. Cardiac exam with regular rhythm normal S1 and S2. No murmur or gallop noted. Lungs are clear to auscultation. Abdomen is full.  It is symmetrical soft and nontender without organomegaly or masses. No LE edema or clubbing  noted.   Assessment:  #1.  Chronic constipation.  She is laxative dependent.  She is on amities up with so far she has not been successful in decreasing Senokot dose.  Remains to goal.  She is also having difficult defecation and I wonder if she has rectocele.  #2.  Solid food dysphagia.  Last esophageal dilation was 14 years ago.  She has chronic GERD and heartburn is well controlled with therapy.  #3.  Productive cough.  He reports blood tinged sputum sometimes.  She does not have acute symptoms.  Plan:  Barium pill esophagogram. Patient advised to decrease senna court dose by 1 tablet every 4 weeks. She should not go back to higher dose like she has been doing. She will continue Amitiza at current dose. And advised to make an appointment to see Dr. Barrie Dunker for pelvic exam to make sure she does not have large rectocele. Patient advised to follow-up with Dr. Luan Pulling regarding blood tinged sputum. Office visit in 6 months.

## 2017-04-29 ENCOUNTER — Other Ambulatory Visit (INDEPENDENT_AMBULATORY_CARE_PROVIDER_SITE_OTHER): Payer: Self-pay | Admitting: Internal Medicine

## 2017-05-13 ENCOUNTER — Ambulatory Visit (HOSPITAL_COMMUNITY)
Admission: RE | Admit: 2017-05-13 | Discharge: 2017-05-13 | Disposition: A | Discharge status: Home / Self Care | Payer: PPO | Source: Ambulatory Visit | Attending: Internal Medicine | Admitting: Internal Medicine

## 2017-05-13 DIAGNOSIS — R1319 Other dysphagia: Secondary | ICD-10-CM

## 2017-05-13 DIAGNOSIS — R1310 Dysphagia, unspecified: Secondary | ICD-10-CM | POA: Insufficient documentation

## 2017-05-13 DIAGNOSIS — K224 Dyskinesia of esophagus: Secondary | ICD-10-CM | POA: Diagnosis not present

## 2017-05-13 DIAGNOSIS — T17308A Unspecified foreign body in larynx causing other injury, initial encounter: Secondary | ICD-10-CM | POA: Diagnosis not present

## 2017-05-25 DIAGNOSIS — H01009 Unspecified blepharitis unspecified eye, unspecified eyelid: Secondary | ICD-10-CM | POA: Diagnosis not present

## 2017-05-25 DIAGNOSIS — H16223 Keratoconjunctivitis sicca, not specified as Sjogren's, bilateral: Secondary | ICD-10-CM | POA: Diagnosis not present

## 2017-06-02 ENCOUNTER — Ambulatory Visit: Payer: PPO | Admitting: Urology

## 2017-07-07 DIAGNOSIS — K5901 Slow transit constipation: Secondary | ICD-10-CM | POA: Diagnosis not present

## 2017-07-27 DIAGNOSIS — M545 Low back pain: Secondary | ICD-10-CM | POA: Diagnosis not present

## 2017-07-27 DIAGNOSIS — J449 Chronic obstructive pulmonary disease, unspecified: Secondary | ICD-10-CM | POA: Diagnosis not present

## 2017-07-27 DIAGNOSIS — E785 Hyperlipidemia, unspecified: Secondary | ICD-10-CM | POA: Diagnosis not present

## 2017-07-27 DIAGNOSIS — J301 Allergic rhinitis due to pollen: Secondary | ICD-10-CM | POA: Diagnosis not present

## 2017-08-13 DIAGNOSIS — M79671 Pain in right foot: Secondary | ICD-10-CM | POA: Diagnosis not present

## 2017-08-13 DIAGNOSIS — M79672 Pain in left foot: Secondary | ICD-10-CM | POA: Diagnosis not present

## 2017-08-13 DIAGNOSIS — M7742 Metatarsalgia, left foot: Secondary | ICD-10-CM | POA: Diagnosis not present

## 2017-09-07 ENCOUNTER — Ambulatory Visit (INDEPENDENT_AMBULATORY_CARE_PROVIDER_SITE_OTHER): Payer: PPO | Admitting: Otolaryngology

## 2017-09-07 DIAGNOSIS — R42 Dizziness and giddiness: Secondary | ICD-10-CM | POA: Diagnosis not present

## 2017-09-07 DIAGNOSIS — H8111 Benign paroxysmal vertigo, right ear: Secondary | ICD-10-CM

## 2017-09-07 DIAGNOSIS — H903 Sensorineural hearing loss, bilateral: Secondary | ICD-10-CM

## 2017-09-11 ENCOUNTER — Other Ambulatory Visit (INDEPENDENT_AMBULATORY_CARE_PROVIDER_SITE_OTHER): Payer: Self-pay | Admitting: Otolaryngology

## 2017-09-14 ENCOUNTER — Other Ambulatory Visit (INDEPENDENT_AMBULATORY_CARE_PROVIDER_SITE_OTHER): Payer: Self-pay | Admitting: Otolaryngology

## 2017-09-14 DIAGNOSIS — H9042 Sensorineural hearing loss, unilateral, left ear, with unrestricted hearing on the contralateral side: Secondary | ICD-10-CM

## 2017-09-14 DIAGNOSIS — IMO0001 Reserved for inherently not codable concepts without codable children: Secondary | ICD-10-CM

## 2017-09-22 ENCOUNTER — Ambulatory Visit (HOSPITAL_COMMUNITY)
Admission: RE | Admit: 2017-09-22 | Discharge: 2017-09-22 | Disposition: A | Discharge status: Home / Self Care | Payer: PPO | Source: Ambulatory Visit | Attending: Otolaryngology | Admitting: Otolaryngology

## 2017-09-22 DIAGNOSIS — IMO0001 Reserved for inherently not codable concepts without codable children: Secondary | ICD-10-CM

## 2017-09-22 DIAGNOSIS — H9042 Sensorineural hearing loss, unilateral, left ear, with unrestricted hearing on the contralateral side: Secondary | ICD-10-CM | POA: Diagnosis not present

## 2017-09-22 DIAGNOSIS — R42 Dizziness and giddiness: Secondary | ICD-10-CM | POA: Diagnosis not present

## 2017-09-22 DIAGNOSIS — R410 Disorientation, unspecified: Secondary | ICD-10-CM | POA: Diagnosis not present

## 2017-09-22 LAB — POCT I-STAT CREATININE: Creatinine, Ser: 0.9 mg/dL (ref 0.44–1.00)

## 2017-09-22 MED ORDER — GADOBENATE DIMEGLUMINE 529 MG/ML IV SOLN
20.0000 mL | Freq: Once | INTRAVENOUS | Status: AC | PRN
  Administered 2017-09-22: 17 mL via INTRAVENOUS

## 2017-10-05 ENCOUNTER — Ambulatory Visit (INDEPENDENT_AMBULATORY_CARE_PROVIDER_SITE_OTHER): Payer: PPO | Admitting: Otolaryngology

## 2017-10-05 DIAGNOSIS — H8112 Benign paroxysmal vertigo, left ear: Secondary | ICD-10-CM

## 2017-10-05 DIAGNOSIS — R42 Dizziness and giddiness: Secondary | ICD-10-CM | POA: Diagnosis not present

## 2017-10-06 ENCOUNTER — Other Ambulatory Visit (HOSPITAL_COMMUNITY): Payer: Self-pay | Admitting: Pulmonary Disease

## 2017-10-06 DIAGNOSIS — R739 Hyperglycemia, unspecified: Secondary | ICD-10-CM | POA: Diagnosis not present

## 2017-10-06 DIAGNOSIS — M545 Low back pain: Secondary | ICD-10-CM | POA: Diagnosis not present

## 2017-10-06 DIAGNOSIS — Z0000 Encounter for general adult medical examination without abnormal findings: Secondary | ICD-10-CM | POA: Diagnosis not present

## 2017-10-06 DIAGNOSIS — F419 Anxiety disorder, unspecified: Secondary | ICD-10-CM | POA: Diagnosis not present

## 2017-10-06 DIAGNOSIS — E785 Hyperlipidemia, unspecified: Secondary | ICD-10-CM | POA: Diagnosis not present

## 2017-10-06 DIAGNOSIS — M109 Gout, unspecified: Secondary | ICD-10-CM | POA: Diagnosis not present

## 2017-10-06 DIAGNOSIS — M159 Polyosteoarthritis, unspecified: Secondary | ICD-10-CM | POA: Diagnosis not present

## 2017-10-06 DIAGNOSIS — G589 Mononeuropathy, unspecified: Secondary | ICD-10-CM | POA: Diagnosis not present

## 2017-10-06 DIAGNOSIS — Z780 Asymptomatic menopausal state: Secondary | ICD-10-CM

## 2017-10-06 DIAGNOSIS — K5900 Constipation, unspecified: Secondary | ICD-10-CM | POA: Diagnosis not present

## 2017-10-09 DIAGNOSIS — L821 Other seborrheic keratosis: Secondary | ICD-10-CM | POA: Diagnosis not present

## 2017-10-09 DIAGNOSIS — L812 Freckles: Secondary | ICD-10-CM | POA: Diagnosis not present

## 2017-10-09 DIAGNOSIS — L570 Actinic keratosis: Secondary | ICD-10-CM | POA: Diagnosis not present

## 2017-10-09 DIAGNOSIS — L820 Inflamed seborrheic keratosis: Secondary | ICD-10-CM | POA: Diagnosis not present

## 2017-10-13 ENCOUNTER — Ambulatory Visit (INDEPENDENT_AMBULATORY_CARE_PROVIDER_SITE_OTHER): Payer: PPO | Admitting: Internal Medicine

## 2017-10-15 DIAGNOSIS — M7742 Metatarsalgia, left foot: Secondary | ICD-10-CM | POA: Diagnosis not present

## 2017-10-15 DIAGNOSIS — M79672 Pain in left foot: Secondary | ICD-10-CM | POA: Diagnosis not present

## 2017-10-15 DIAGNOSIS — M79671 Pain in right foot: Secondary | ICD-10-CM | POA: Diagnosis not present

## 2017-10-15 DIAGNOSIS — M7741 Metatarsalgia, right foot: Secondary | ICD-10-CM | POA: Diagnosis not present

## 2017-10-19 ENCOUNTER — Encounter (INDEPENDENT_AMBULATORY_CARE_PROVIDER_SITE_OTHER): Payer: Self-pay | Admitting: Internal Medicine

## 2017-10-19 ENCOUNTER — Ambulatory Visit (INDEPENDENT_AMBULATORY_CARE_PROVIDER_SITE_OTHER): Payer: PPO | Admitting: Internal Medicine

## 2017-10-19 VITALS — BP 110/70 | HR 60 | Temp 97.1°F | Resp 18 | Ht 67.5 in | Wt 170.5 lb

## 2017-10-19 DIAGNOSIS — K5909 Other constipation: Secondary | ICD-10-CM | POA: Diagnosis not present

## 2017-10-19 MED ORDER — LUBIPROSTONE 24 MCG PO CAPS: 24.0000 ug | ORAL_CAPSULE | Freq: Two times a day (BID) | ORAL | 3 refills | Status: DC

## 2017-10-19 NOTE — Patient Instructions (Signed)
Notify if present therapy stops working re-constipation

## 2017-10-19 NOTE — Progress Notes (Signed)
Presenting complaint;  Follow-up for chronic constipation.  Subjective:  Patient is 82 year old Caucasian female who has been dealing with laxative dependent lifelong constipation who is here for scheduled visit.  She was last seen 6 months ago. She states she is doing well as far as her bowels are concerned.  She has bowel movement daily or every other day although some of her movements are small volume.  She is only been able to cut laxative dose from 10 to 9 pills daily.  She said she buys thousand tablets at a time and it causes her $18.  She is also taking Amitiza and needs new prescription. She denies melena or rectal bleeding.  Lately she has not had a throbbing pain the last of the abdomen she has had off and on for the past few years.  She was having more back pain.  She decided to go on a diet and has lost 16 pounds since her last visit.  She states she has lost this weight in 3 months.  Her goal is to get down to 165 pounds.  She is also exercising regularly but has not done much in the last 8 weeks.  She states she has very good appetite.  She is hoping to get back on her schedule.  On her last visit she was also complaining of blood-tinged sputum.  It resolved spontaneously and she did not follow with Dr. Luan Pulling.  In retrospect she feels it could have been due to drying from antihistaminic.  She was also seen by Dr. Barrie Dunker and rectocele was ruled out.  She says she has been tried on various antidepressants but they all cause itching.  She has decreased sertraline dose to 12.5 mg daily. She states she had blood work by Dr. Luan Pulling about 2 weeks ago.  She was surprised to learn her lipid profile was abnormal.  She states her LFTs and CBC were normal.  Current Medications: Outpatient Encounter Medications as of 10/19/2017  Medication Sig  . albuterol (PROVENTIL HFA;VENTOLIN HFA) 108 (90 BASE) MCG/ACT inhaler Inhale 2 puffs into the lungs every 6 (six) hours as needed for wheezing or shortness  of breath.  Marland Kitchen albuterol (PROVENTIL) (2.5 MG/3ML) 0.083% nebulizer solution Take 2.5 mg by nebulization 2 (two) times daily as needed for wheezing or shortness of breath.  . allopurinol (ZYLOPRIM) 300 MG tablet Take 300 mg by mouth daily.  Marland Kitchen ALPRAZolam (XANAX) 0.5 MG tablet Take 0.5 mg by mouth as needed for anxiety.   . AMITIZA 24 MCG capsule TAKE 1 CAPSULE BY MOUTH TWICE DAILY WITH MEALS  . amLODipine (NORVASC) 2.5 MG tablet Take 2.5 mg by mouth every other day. In the am  . aspirin 81 MG tablet Take 81 mg by mouth daily.  . Biotin 5000 MCG CAPS Take 5,000 mcg by mouth daily.  . digoxin (LANOXIN) 0.25 MG tablet Take 0.25 mg by mouth every other day.   Marland Kitchen doxepin (SINEQUAN) 100 MG capsule Take 100 mg by mouth at bedtime.   . fexofenadine (ALLEGRA) 60 MG tablet Take 30 mg by mouth daily.   . fluticasone-salmeterol (ADVAIR HFA) 115-21 MCG/ACT inhaler Inhale 1 puff into the lungs 2 (two) times daily.  Marland Kitchen gabapentin (NEURONTIN) 300 MG capsule Take 300 mg by mouth 2 (two) times daily.   Marland Kitchen lubiprostone (AMITIZA) 24 MCG capsule Take 24 mcg by mouth twice daily  . naproxen (NAPROSYN) 500 MG tablet Take 500 mg by mouth 2 (two) times daily with a meal.   .  omeprazole (PRILOSEC) 20 MG capsule Take 20 mg by mouth every morning.  . pravastatin (PRAVACHOL) 20 MG tablet Take 20 mg by mouth every other day.   . quiNINE (QUALAQUIN) 324 MG capsule Take 324 mg by mouth at bedtime.   . senna (SENOKOT) 8.6 MG tablet Take 5 tablets (43 mg total) by mouth daily. (Patient taking differently: Take 9 tablets by mouth at bedtime. )  . sertraline (ZOLOFT) 25 MG tablet Take 12.5 mg by mouth daily.   No facility-administered encounter medications on file as of 10/19/2017.      Objective: Blood pressure 110/70, pulse 60, temperature (!) 97.1 F (36.2 C), temperature source Oral, resp. rate 18, height 5' 7.5" (1.715 m), weight 170 lb 8 oz (77.3 kg). Patient is alert and in no acute distress. Conjunctiva is pink. Sclera is  nonicteric Oropharyngeal mucosa is normal. No neck masses or thyromegaly noted. Cardiac exam with regular rhythm normal S1 and S2. No murmur or gallop noted. Lungs are clear to auscultation. Abdomen is symmetrical soft and nontender with organomegaly or masses. No LE edema or clubbing noted.   Assessment:  #1.  Refractory chronic constipation requiring high-dose senna and Amitiza or lubiprostone.  She is not having any side effects with senna.  She is been successful in dropping dose from 10 tablets to 9 tablets daily.  Hopefully between now her next visit she will drop the dose further by 1 to 2 tablets.  If current combination fails will try her new medication.   Plan:  Will request copy of recent blood work from Dr. Luan Pulling office for review. New prescription for Amitiza 24 mcg twice daily sent to patient's pharmacy.  89-month supply with 3 refills. Patient advised to decrease senna dose from 9 tablets to 7 or 8 daily. Office visit in 6 months.

## 2017-11-11 ENCOUNTER — Other Ambulatory Visit (HOSPITAL_COMMUNITY): Payer: PPO

## 2017-11-19 ENCOUNTER — Encounter (INDEPENDENT_AMBULATORY_CARE_PROVIDER_SITE_OTHER): Payer: Self-pay | Admitting: Physician Assistant

## 2017-11-19 ENCOUNTER — Ambulatory Visit (INDEPENDENT_AMBULATORY_CARE_PROVIDER_SITE_OTHER): Payer: PPO | Admitting: Physician Assistant

## 2017-11-19 DIAGNOSIS — R2241 Localized swelling, mass and lump, right lower limb: Secondary | ICD-10-CM | POA: Diagnosis not present

## 2017-11-19 NOTE — Progress Notes (Signed)
HPI: Zoe [name] returns today complaining of the right hip mass at the distal end of her right total hip surgical incision.  She denies any fevers chills.  She had no drainage from the area.  She states that the nodule remains painful to the touch.  She had been scheduled for to have this removed but canceled it due to transportation issues.  Physical exam: Right hip good range of motion without pain.  She has tenderness at the distal end of her surgical incision with a palpable tubular-like structure.  She has significant tenderness in this area but no erythema ecchymosis or drainage.  There is no abnormal warmth about the incision.  Impression: Right right thigh mass  Plan: This point time she would like to have this mass excised.  Risks including wound healing problems prolonged pain worsening pain infection all reviewed with the patient.  We will proceed with the excision of her right thigh mass in the near future this be done an outpatient setting.  She will follow-up with [place] 2 weeks postop.  Questions were encouraged and answered at length.

## 2017-11-25 ENCOUNTER — Ambulatory Visit (HOSPITAL_COMMUNITY)
Admission: RE | Admit: 2017-11-25 | Discharge: 2017-11-25 | Disposition: A | Discharge status: Home / Self Care | Payer: PPO | Source: Ambulatory Visit | Attending: Pulmonary Disease | Admitting: Pulmonary Disease

## 2017-11-25 DIAGNOSIS — Z780 Asymptomatic menopausal state: Secondary | ICD-10-CM | POA: Insufficient documentation

## 2017-11-25 DIAGNOSIS — M85831 Other specified disorders of bone density and structure, right forearm: Secondary | ICD-10-CM | POA: Diagnosis not present

## 2017-11-25 DIAGNOSIS — Z96643 Presence of artificial hip joint, bilateral: Secondary | ICD-10-CM | POA: Diagnosis not present

## 2017-11-25 DIAGNOSIS — M85832 Other specified disorders of bone density and structure, left forearm: Secondary | ICD-10-CM | POA: Diagnosis not present

## 2017-12-02 DIAGNOSIS — Z961 Presence of intraocular lens: Secondary | ICD-10-CM | POA: Diagnosis not present

## 2017-12-02 DIAGNOSIS — H532 Diplopia: Secondary | ICD-10-CM | POA: Diagnosis not present

## 2017-12-02 DIAGNOSIS — H40013 Open angle with borderline findings, low risk, bilateral: Secondary | ICD-10-CM | POA: Diagnosis not present

## 2017-12-02 DIAGNOSIS — H04123 Dry eye syndrome of bilateral lacrimal glands: Secondary | ICD-10-CM | POA: Diagnosis not present

## 2017-12-31 DIAGNOSIS — M60251 Foreign body granuloma of soft tissue, not elsewhere classified, right thigh: Secondary | ICD-10-CM | POA: Diagnosis not present

## 2018-01-12 ENCOUNTER — Ambulatory Visit (INDEPENDENT_AMBULATORY_CARE_PROVIDER_SITE_OTHER): Payer: PPO | Admitting: Orthopaedic Surgery

## 2018-01-12 ENCOUNTER — Encounter (INDEPENDENT_AMBULATORY_CARE_PROVIDER_SITE_OTHER): Payer: Self-pay | Admitting: Orthopaedic Surgery

## 2018-01-12 DIAGNOSIS — R2241 Localized swelling, mass and lump, right lower limb: Secondary | ICD-10-CM

## 2018-01-12 DIAGNOSIS — H532 Diplopia: Secondary | ICD-10-CM | POA: Diagnosis not present

## 2018-01-12 DIAGNOSIS — H40013 Open angle with borderline findings, low risk, bilateral: Secondary | ICD-10-CM | POA: Diagnosis not present

## 2018-01-12 NOTE — Progress Notes (Signed)
The patient is 12 days status post surgery on her right hip.  This was really surgery remove the superficial mass that was a walled off retained suture in the soft tissue that had been bugging her quite a bit.  We made a small incision and remove this material.  It looked to [place] that it was scar tissue and likely walled off retained not of a suture that did never absorb.  She is doing well overall.  On exam I remove the sutures in place Steri-Strips.  I gave her wound care instructions for not getting this wet in terms of the bathtub for the next 2 weeks.  She can get it wet in the shower and will leave the Steri-Strips fall off on their own.  All question concerns were answered and addressed.  Follow will be as needed.

## 2018-01-13 DIAGNOSIS — R3915 Urgency of urination: Secondary | ICD-10-CM | POA: Diagnosis not present

## 2018-01-13 DIAGNOSIS — N952 Postmenopausal atrophic vaginitis: Secondary | ICD-10-CM | POA: Diagnosis not present

## 2018-02-02 DIAGNOSIS — H532 Diplopia: Secondary | ICD-10-CM | POA: Diagnosis not present

## 2018-02-02 DIAGNOSIS — H52221 Regular astigmatism, right eye: Secondary | ICD-10-CM | POA: Diagnosis not present

## 2018-02-02 DIAGNOSIS — H16223 Keratoconjunctivitis sicca, not specified as Sjogren's, bilateral: Secondary | ICD-10-CM | POA: Diagnosis not present

## 2018-03-15 ENCOUNTER — Ambulatory Visit (INDEPENDENT_AMBULATORY_CARE_PROVIDER_SITE_OTHER): Payer: PPO

## 2018-03-15 ENCOUNTER — Other Ambulatory Visit (INDEPENDENT_AMBULATORY_CARE_PROVIDER_SITE_OTHER): Payer: Self-pay

## 2018-03-15 ENCOUNTER — Ambulatory Visit (INDEPENDENT_AMBULATORY_CARE_PROVIDER_SITE_OTHER): Payer: PPO | Admitting: Physician Assistant

## 2018-03-15 ENCOUNTER — Encounter (INDEPENDENT_AMBULATORY_CARE_PROVIDER_SITE_OTHER): Payer: Self-pay | Admitting: Physician Assistant

## 2018-03-15 DIAGNOSIS — M25512 Pain in left shoulder: Secondary | ICD-10-CM | POA: Diagnosis not present

## 2018-03-15 DIAGNOSIS — G8929 Other chronic pain: Secondary | ICD-10-CM

## 2018-03-15 DIAGNOSIS — M545 Low back pain, unspecified: Secondary | ICD-10-CM

## 2018-03-15 DIAGNOSIS — M4807 Spinal stenosis, lumbosacral region: Secondary | ICD-10-CM

## 2018-03-15 MED ORDER — METHYLPREDNISOLONE ACETATE 40 MG/ML IJ SUSP
40.0000 mg | INTRAMUSCULAR | Status: AC | PRN
  Administered 2018-03-15: 40 mg via INTRA_ARTICULAR

## 2018-03-15 MED ORDER — DICLOFENAC SODIUM 1 % TD GEL: 2.0000 g | Freq: Four times a day (QID) | TRANSDERMAL | 3 refills | Status: DC

## 2018-03-15 MED ORDER — LIDOCAINE HCL 1 % IJ SOLN
3.0000 mL | INTRAMUSCULAR | Status: AC | PRN
Start: 2018-03-15 — End: 2018-03-15
  Administered 2018-03-15: 3 mL
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
## 2018-03-18 ENCOUNTER — Telehealth (INDEPENDENT_AMBULATORY_CARE_PROVIDER_SITE_OTHER): Payer: Self-pay | Admitting: *Deleted

## 2018-03-18 NOTE — Telephone Encounter (Signed)
IC pt and lvm to see if pt would like to have her MRI done at [facility] which is closer to home.  Pending call back from pt

## 2018-03-18 NOTE — Telephone Encounter (Signed)
Patient called back stating that she does prefer to have her MRI at [facility].  Thank you.

## 2018-03-19 NOTE — Telephone Encounter (Signed)
Order changed to [facility]

## 2018-03-22 ENCOUNTER — Other Ambulatory Visit (INDEPENDENT_AMBULATORY_CARE_PROVIDER_SITE_OTHER): Payer: Self-pay

## 2018-03-22 MED ORDER — DICLOFENAC SODIUM 1 % TD GEL: 2.0000 g | Freq: Four times a day (QID) | TRANSDERMAL | 3 refills | Status: DC

## 2018-03-23 ENCOUNTER — Ambulatory Visit (HOSPITAL_COMMUNITY)
Admission: RE | Admit: 2018-03-23 | Discharge: 2018-03-23 | Disposition: A | Discharge status: Home / Self Care | Payer: PPO | Source: Ambulatory Visit | Attending: Orthopaedic Surgery | Admitting: Orthopaedic Surgery

## 2018-03-23 DIAGNOSIS — M4807 Spinal stenosis, lumbosacral region: Secondary | ICD-10-CM | POA: Diagnosis not present

## 2018-03-23 DIAGNOSIS — M545 Low back pain: Secondary | ICD-10-CM | POA: Diagnosis not present

## 2018-03-24 ENCOUNTER — Telehealth (INDEPENDENT_AMBULATORY_CARE_PROVIDER_SITE_OTHER): Payer: Self-pay | Admitting: Orthopaedic Surgery

## 2018-03-24 MED ORDER — DICLOFENAC SODIUM 1 % TD GEL: 2.0000 g | Freq: Four times a day (QID) | TRANSDERMAL | 3 refills | Status: DC

## 2018-03-24 NOTE — Telephone Encounter (Signed)
Patient requests refill diclofenac 1% gel.

## 2018-03-29 ENCOUNTER — Encounter (INDEPENDENT_AMBULATORY_CARE_PROVIDER_SITE_OTHER): Payer: Self-pay | Admitting: Physician Assistant

## 2018-03-29 ENCOUNTER — Ambulatory Visit (INDEPENDENT_AMBULATORY_CARE_PROVIDER_SITE_OTHER): Payer: PPO | Admitting: Physician Assistant

## 2018-03-29 DIAGNOSIS — M5441 Lumbago with sciatica, right side: Secondary | ICD-10-CM

## 2018-03-29 DIAGNOSIS — G8929 Other chronic pain: Secondary | ICD-10-CM | POA: Diagnosis not present

## 2018-03-29 MED ORDER — LIDOCAINE HCL 1 % IJ SOLN
0.5000 mL | INTRAMUSCULAR | Status: AC | PRN
  Administered 2018-03-29: .5 mL

## 2018-03-29 MED ORDER — METHYLPREDNISOLONE ACETATE 40 MG/ML IJ SUSP
40.0000 mg | INTRAMUSCULAR | Status: AC | PRN
  Administered 2018-03-29: 40 mg via INTRAMUSCULAR

## 2018-03-29 NOTE — Progress Notes (Signed)
HPI: Mrs. Zoe [name] returns today to go over the MRI of her lumbar spine.  She states that her left shoulder pain is much better after undergoing the injection.  In regards to her low back pain she continues to have radicular symptoms down the right leg.  Has had no bowel bladder dysfunction.  Complains of one particular area in the low lumbar paraspinous region on the right that is quite uncomfortable.  She denies any pain down the left leg. MRI images are reviewed with the patient and I lumbar model is used for visualization purposes along with the actual MRI images.  MRI lumbar spine dated 03/23/2018 showed a broad-based disc protrusion at L2-3 moderate foraminal narrowing worse on the right.  L3-4 broad-based disc protrusion with moderate right subarticular and severe right foraminal stenosis.  Mild left foraminal stenosis.  L4-5 broad-based disc protrusion.  Moderate foraminal narrowing worse on the left.  L5-S1 broad-based disc protrusion with moderate to severe foraminal stenosis worse on the left.  Physical exam: Lumbar spine she has tenderness the in the right paraspinous region at level of L5-S1.  There is no ecchymosis erythema in this region.  Impression: Low back pain with radicular symptoms down the right leg.  Plan: Recommend epidural steroid injection lumbar spine.  Also recommended trigger point injection to the right lower paraspinous region patient wished to proceed with this today.  The injection was performed without any adverse effects.  She will follow-up with [place] in 6 weeks to check her response to the injections with Dr. Ernestina Patches.  Questions encouraged and answered both patient and her husband was present today.    Procedure Note  Patient: Zoe [name]             Date of Birth: 03-23-1935           MRN: [id number]             Visit Date: 03/29/2018  Procedures: Visit Diagnoses: Chronic right-sided low back pain with right-sided sciatica  Trigger Point Inj Date/Time:  03/29/2018 6:28 PM Performed by: Pete Pelt, PA-C Authorized by: Pete Pelt, PA-C   Indications:  Pain Total # of Trigger Points:  1 Location: back   Needle Size:  25 G Approach:  Dorsal Medications #1:  40 mg methylPREDNISolone acetate 40 MG/ML; 0.5 mL lidocaine 1 % Patient tolerance:  Patient tolerated the procedure well with no immediate complications

## 2018-03-30 ENCOUNTER — Other Ambulatory Visit (INDEPENDENT_AMBULATORY_CARE_PROVIDER_SITE_OTHER): Payer: Self-pay

## 2018-03-30 DIAGNOSIS — M5431 Sciatica, right side: Secondary | ICD-10-CM

## 2018-03-30 DIAGNOSIS — R29898 Other symptoms and signs involving the musculoskeletal system: Secondary | ICD-10-CM

## 2018-04-02 DIAGNOSIS — L570 Actinic keratosis: Secondary | ICD-10-CM | POA: Diagnosis not present

## 2018-04-02 DIAGNOSIS — L821 Other seborrheic keratosis: Secondary | ICD-10-CM | POA: Diagnosis not present

## 2018-04-02 DIAGNOSIS — L812 Freckles: Secondary | ICD-10-CM | POA: Diagnosis not present

## 2018-04-02 DIAGNOSIS — L820 Inflamed seborrheic keratosis: Secondary | ICD-10-CM | POA: Diagnosis not present

## 2018-04-07 ENCOUNTER — Other Ambulatory Visit: Payer: Self-pay

## 2018-04-07 ENCOUNTER — Encounter (INDEPENDENT_AMBULATORY_CARE_PROVIDER_SITE_OTHER): Payer: Self-pay | Admitting: *Deleted

## 2018-04-07 ENCOUNTER — Encounter (INDEPENDENT_AMBULATORY_CARE_PROVIDER_SITE_OTHER): Payer: Self-pay | Admitting: Internal Medicine

## 2018-04-07 ENCOUNTER — Ambulatory Visit (INDEPENDENT_AMBULATORY_CARE_PROVIDER_SITE_OTHER): Payer: PPO | Admitting: Internal Medicine

## 2018-04-07 VITALS — BP 104/78 | HR 60 | Temp 97.2°F | Ht 67.5 in | Wt 175.3 lb

## 2018-04-07 DIAGNOSIS — R1030 Lower abdominal pain, unspecified: Secondary | ICD-10-CM

## 2018-04-07 DIAGNOSIS — J301 Allergic rhinitis due to pollen: Secondary | ICD-10-CM | POA: Diagnosis not present

## 2018-04-07 NOTE — Patient Instructions (Signed)
US abdomen complete

## 2018-04-07 NOTE — Progress Notes (Signed)
Subjective:    Patient ID: Zoe [name], female    DOB: Apr 30, 1935, 83 y.o.   MRN: [id number]  HPI She thinks she has a hernia. Last night, she wore a pair of jeans yesterday. Her jeans were pretty tight.  She had a lot of abdominal pain while wearing them. . She says she had hernia a long time ago. She says she stays constipated which is normal for her.   Her appetite is okay. No weight loss.  She has a BM daily for 3 weeks then it may once a month. She says usually she has a BM x 1 a week.     Review of Systems Past Medical History:  Diagnosis Date  . Abdominal discomfort 11-02-12   suspected ?UTI- PCP MD placed on Cipro  . Anxiety   . Arthritis   . Asthma   . Cancer ([place])    skin CA  . Chronic constipation   . Constipation   . Cough   . Depression   . Dysrhythmia    tachycardia  . Environmental allergies   . Fibromyalgia   . GERD (gastroesophageal reflux disease)   . Hemorrhoids   . High cholesterol   . Hypercholesteremia   . Hypertension   . Nausea   . Pneumonia    7+ YRS  . Urgency of urination     Past Surgical History:  Procedure Laterality Date  . ANAL RECTAL MANOMETRY N/A 11/28/2013   Procedure: ANO RECTAL MANOMETRY;  Surgeon: Leighton Ruff, MD;  [facility];  Service: Endoscopy;  Laterality: N/A;  . APPENDECTOMY    . BACK SURGERY     X2   . CHOLECYSTECTOMY    . COLONOSCOPY  03/29/03  . COLONOSCOPY N/A 12/09/2013   Procedure: COLONOSCOPY;  Surgeon: Rogene Houston, MD;  [facility];  Service: Endoscopy;  Laterality: N/A;  730  . COLONOSCOPY N/A 02/04/2017   Procedure: COLONOSCOPY;  Surgeon: Rogene Houston, MD;  [facility];  Service: Endoscopy;  Laterality: N/A;  930  . EYE SURGERY     bilateral cataract removal  . SKIN CANCER EXCISION     RIGHT NECK    . TONSILLECTOMY     T+A  . TOTAL HIP ARTHROPLASTY Right 09/14/2012   Procedure: RIGHT TOTAL HIP ARTHROPLASTY ANTERIOR APPROACH and RIGHT KNEE STEROID INJECTION;   Surgeon: Mcarthur Rossetti, MD;  Location: [facility];  Service: Orthopedics;  Laterality: Right;  . TOTAL HIP ARTHROPLASTY Left 11/05/2012   Procedure: LEFT TOTAL HIP ARTHROPLASTY ANTERIOR APPROACH;  Surgeon: Mcarthur Rossetti, MD;  [facility];  Service: Orthopedics;  Laterality: Left;  . UMBILICAL HERNIA REPAIR  04/26/03   JENKINS  . UPPER GASTROINTESTINAL ENDOSCOPY  08/03/2009   NUR  . UPPER GASTROINTESTINAL ENDOSCOPY  03/29/2003   EGD ED TCS    Allergies  Allergen Reactions  . Codeine Other (See Comments)    Pains in abdominal area  . Escitalopram Itching  . Other     NOVACAINE     ITCHING  . Oxycodone     Abdominal pain  . Paxil [Paroxetine Hcl] Itching  . Procaine Itching    Current Outpatient Medications on File Prior to Visit  Medication Sig Dispense Refill  . albuterol (PROVENTIL HFA;VENTOLIN HFA) 108 (90 BASE) MCG/ACT inhaler Inhale 2 puffs into the lungs every 6 (six) hours as needed for wheezing or shortness of breath.    Marland Kitchen albuterol (PROVENTIL) (2.5 MG/3ML) 0.083% nebulizer solution Take 2.5  mg by nebulization 2 (two) times daily as needed for wheezing or shortness of breath.    . allopurinol (ZYLOPRIM) 300 MG tablet Take 300 mg by mouth daily.    Marland Kitchen ALPRAZolam (XANAX) 0.5 MG tablet Take 0.5 mg by mouth as needed for anxiety.     Marland Kitchen amLODipine (NORVASC) 2.5 MG tablet Take 2.5 mg by mouth every other day. In the am    . aspirin 81 MG tablet Take 81 mg by mouth daily.    . Biotin 5000 MCG CAPS Take 5,000 mcg by mouth daily.    . digoxin (LANOXIN) 0.25 MG tablet Take 0.25 mg by mouth every other day.     Marland Kitchen doxepin (SINEQUAN) 100 MG capsule Take 100 mg by mouth at bedtime.     . fexofenadine (ALLEGRA) 60 MG tablet Take 30 mg by mouth daily.     Marland Kitchen gabapentin (NEURONTIN) 300 MG capsule Take 300 mg by mouth 2 (two) times daily.     Marland Kitchen lubiprostone (AMITIZA) 24 MCG capsule Take 1 capsule (24 mcg total) by mouth 2 (two) times daily with a meal. 180 capsule 3  .  naproxen (NAPROSYN) 500 MG tablet Take 500 mg by mouth daily.     Marland Kitchen omeprazole (PRILOSEC) 20 MG capsule Take 20 mg by mouth every morning.    . pravastatin (PRAVACHOL) 20 MG tablet Take 20 mg by mouth every other day.     . quiNINE (QUALAQUIN) 324 MG capsule Take 324 mg by mouth at bedtime.     . senna (SENOKOT) 8.6 MG tablet Take 5 tablets (43 mg total) by mouth daily. (Patient taking differently: Take 9 tablets by mouth at bedtime. )     No current facility-administered medications on file prior to visit.         Objective:   Physical Exam Blood pressure 104/78, pulse 60, temperature (!) 97.2 F (36.2 C), height 5' 7.5" (1.715 m), weight 175 lb 4.8 oz (79.5 kg).  Alert and oriented. Skin warm and dry. Oral mucosa is moist.   . Sclera anicteric, conjunctivae is pink. Thyroid not enlarged. No cervical lymphadenopathy. Lungs clear. Heart regular rate and rhythm.  Abdomen is soft. Bowel sounds are positive. No hepatomegaly. No abdominal masses felt. Slight tenderness LLQ (inguinal area)  No edema to lower extremities.         Assessment & Plan:  Abdominal pain. She thinks she has hernia. Will get an US abdomen complete.

## 2018-04-09 ENCOUNTER — Ambulatory Visit (HOSPITAL_COMMUNITY)
Admission: RE | Admit: 2018-04-09 | Discharge: 2018-04-09 | Disposition: A | Discharge status: Home / Self Care | Payer: PPO | Source: Ambulatory Visit | Attending: Internal Medicine | Admitting: Internal Medicine

## 2018-04-09 ENCOUNTER — Other Ambulatory Visit: Payer: Self-pay

## 2018-04-09 DIAGNOSIS — R1030 Lower abdominal pain, unspecified: Secondary | ICD-10-CM | POA: Insufficient documentation

## 2018-04-19 ENCOUNTER — Encounter (INDEPENDENT_AMBULATORY_CARE_PROVIDER_SITE_OTHER): Payer: Self-pay | Admitting: Physical Medicine and Rehabilitation

## 2018-04-20 ENCOUNTER — Ambulatory Visit (INDEPENDENT_AMBULATORY_CARE_PROVIDER_SITE_OTHER): Payer: PPO | Admitting: Internal Medicine

## 2018-05-04 ENCOUNTER — Ambulatory Visit (INDEPENDENT_AMBULATORY_CARE_PROVIDER_SITE_OTHER): Payer: PPO | Admitting: Internal Medicine

## 2018-05-13 ENCOUNTER — Ambulatory Visit (INDEPENDENT_AMBULATORY_CARE_PROVIDER_SITE_OTHER): Payer: PPO | Admitting: Orthopaedic Surgery

## 2018-05-17 ENCOUNTER — Ambulatory Visit (INDEPENDENT_AMBULATORY_CARE_PROVIDER_SITE_OTHER): Payer: PPO | Admitting: Orthopaedic Surgery

## 2018-05-18 ENCOUNTER — Encounter (INDEPENDENT_AMBULATORY_CARE_PROVIDER_SITE_OTHER): Payer: Self-pay | Admitting: Physical Medicine and Rehabilitation

## 2018-05-19 ENCOUNTER — Other Ambulatory Visit: Payer: Self-pay

## 2018-05-19 ENCOUNTER — Encounter (INDEPENDENT_AMBULATORY_CARE_PROVIDER_SITE_OTHER): Payer: Self-pay | Admitting: Orthopaedic Surgery

## 2018-05-19 ENCOUNTER — Ambulatory Visit (INDEPENDENT_AMBULATORY_CARE_PROVIDER_SITE_OTHER): Payer: PPO

## 2018-05-19 ENCOUNTER — Ambulatory Visit (INDEPENDENT_AMBULATORY_CARE_PROVIDER_SITE_OTHER): Payer: PPO | Admitting: Orthopaedic Surgery

## 2018-05-19 ENCOUNTER — Other Ambulatory Visit (INDEPENDENT_AMBULATORY_CARE_PROVIDER_SITE_OTHER): Payer: Self-pay

## 2018-05-19 DIAGNOSIS — M5441 Lumbago with sciatica, right side: Secondary | ICD-10-CM

## 2018-05-19 DIAGNOSIS — M25551 Pain in right hip: Secondary | ICD-10-CM | POA: Diagnosis not present

## 2018-05-19 DIAGNOSIS — Z96641 Presence of right artificial hip joint: Secondary | ICD-10-CM | POA: Diagnosis not present

## 2018-05-19 DIAGNOSIS — G8929 Other chronic pain: Secondary | ICD-10-CM | POA: Diagnosis not present

## 2018-05-19 MED ORDER — METHYLPREDNISOLONE 4 MG PO TABS: ORAL_TABLET | ORAL | 0 refills | Status: DC

## 2018-05-19 MED ORDER — GABAPENTIN 300 MG PO CAPS: 300.0000 mg | ORAL_CAPSULE | Freq: Three times a day (TID) | ORAL | 3 refills | Status: DC

## 2018-05-19 MED ORDER — HYDROCODONE-ACETAMINOPHEN 5-325 MG PO TABS: 1.0000 | ORAL_TABLET | Freq: Four times a day (QID) | ORAL | 0 refills | Status: DC | PRN

## 2018-05-19 NOTE — Progress Notes (Addendum)
The patient is very well-known to [place].  She has had a history of bilateral total hip arthroplasties that have been done remotely.  She also has significant stenosis of her lumbar spine and severe degenerative disc disease between L1 and L2.  She has been getting recent right thigh pain and groin pain when she walks and it swells at night.  She is very uncomfortable feeling and appearing.  She is 83 years old.  She has taken an occasional Norco.  She says she is allergic to hydrocodone but once I told her that codeine and hydrocodone were the main ingredients with Norco she said that she would like to have at least refill this.  She is only taking a half a pill when she needs it.  Is taking naproxen as well.  She also takes gabapentin 300 mg twice a day.  She is walking upright with only just a slight limp.  Examination of her right hip shows no gross issues in terms of pain when I put the hip through range of motion.  It moves smoothly.  Her incisions well-healed.  There is no swelling redness or warmth around the proximal right femur or hip area.  X-rays of the pelvis and right hip show well-seated bilateral total hip arthroplasties and I see no complicating features.  The stem appears bone ingrown.  She has minimal pain of the trochanteric area to palpation on the right or left sides.  I did look at plain films of her lumbar spine that were done just 2 months ago.  She has severe degenerative disc disease at L1 and L2 and this may be causing radicular aspect of the pain that she experiencing around the right hip and groin area.  She did have an MRI of her lumbar spine earlier this year just 2 months ago.  It showed at L2-L3 there is a broad-based disc protrusion asymmetric to the right with also moderate foraminal narrowing from facet hypertrophy.  She also has right-sided disease most significant at L3-L4 with severe right foraminal stenosis.  Both of these things can be causing the symptoms that she is  experiencing in that leg.  At this point given her level discomfort I will put her on a 6 days of a steroid taper.  I will have her increase her gabapentin to 3 times a day I did refill her hydrocodone.  I would like to obtain MRI of the right hip  to rule out any issues with the prosthesis on the right hip such as loosening or fluid collections.  We will see her back  once we have this MRI.   All question concerns were answered and addressed.  Of note she does deny any fever, chills or recent illnesses.  I do feel that she would likely end up benefiting from an epidural steroid injection in her lumbar spine at the L3-L4 level.  We can address that with her as well at her next visit.

## 2018-06-07 ENCOUNTER — Telehealth: Payer: Self-pay | Admitting: Orthopaedic Surgery

## 2018-06-07 NOTE — Telephone Encounter (Signed)
Can you edit the order in the system to [facility]? Thanks.

## 2018-06-07 NOTE — Telephone Encounter (Signed)
Pt called in said her mri was scheduled at [facility] and they're booked a long way out. Pt said she would like to have the mri done at [facility] and she prefers to do a closed mri.  [phone]

## 2018-06-08 NOTE — Telephone Encounter (Signed)
Order changed to [facility]

## 2018-06-14 ENCOUNTER — Ambulatory Visit (INDEPENDENT_AMBULATORY_CARE_PROVIDER_SITE_OTHER): Payer: PPO | Admitting: Internal Medicine

## 2018-06-14 ENCOUNTER — Encounter (INDEPENDENT_AMBULATORY_CARE_PROVIDER_SITE_OTHER): Payer: Self-pay | Admitting: Internal Medicine

## 2018-06-14 ENCOUNTER — Other Ambulatory Visit: Payer: Self-pay

## 2018-06-14 VITALS — BP 128/81 | HR 70 | Temp 97.7°F | Resp 18 | Ht 67.5 in | Wt 176.2 lb

## 2018-06-14 DIAGNOSIS — K5909 Other constipation: Secondary | ICD-10-CM | POA: Diagnosis not present

## 2018-06-14 DIAGNOSIS — R1030 Lower abdominal pain, unspecified: Secondary | ICD-10-CM

## 2018-06-14 MED ORDER — PRUCALOPRIDE SUCCINATE 2 MG PO TABS: 2.0000 mg | ORAL_TABLET | Freq: Every day | ORAL | 0 refills | Status: DC

## 2018-06-14 NOTE — Patient Instructions (Signed)
Discontinue Amitiza. Begin Motegrity 2 mg by mouth every morning. Please call office with progress report next week. Please reports to emergency room if he has another episode of severe abdominal pain.

## 2018-06-14 NOTE — Progress Notes (Signed)
Presenting complaint;  Follow-up for chronic constipation. Patient complains of abdominal pain.  Database and subjective:  Patient is 83 year old Caucasian female with a lifelong constipation who is been dependent on laxatives at a high dose.  She also has a history of small bowel obstruction possibly due to adhesions responding to medical therapy who was last seen in the office by mid [place] NP on 04/07/2022 left-sided abdominal pain.  Patient was concerned that she may have incisional hernia.  She underwent ultrasound was negative.  Patient states the pain resolved after about 2 weeks.  She does not recall having had nausea vomiting hematuria diarrhea or fever with this pain.  Since then she has been having this pain intermittently but it does not last long.  May last for couple minutes.  She reports that pain started when her weight increased to 182 pounds.  She says she has lost weight prior to her office visit on 04/07/2018. As far as her bowels are concerned she has good weeks and bad weeks.  She is still having to take 9 tablets of senna every day in addition to amities up.  She denies melena rectal bleeding nausea or vomiting. She also complains of back pain as well as pain in her right hip.  She has seen Dr. 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  She was given pain medication but it makes her constipation even worse.  She is therefore not taking it. She says her son who has COPD and history of lung carcinoma tested positive for COVID-19 and has fully recovered.  She says her daughter who lives in [place] state also tested positive and has recovered.  She has not had any contact with them lately.  Current Medications: Outpatient Encounter Medications as of 06/14/2018  Medication Sig  . albuterol (PROVENTIL HFA;VENTOLIN HFA) 108 (90 BASE) MCG/ACT inhaler Inhale 2 puffs into the lungs every 6 (six) hours  as needed for wheezing or shortness of breath.  . allopurinol (ZYLOPRIM) 300 MG tablet Take 300 mg by mouth daily.  Marland Kitchen ALPRAZolam (XANAX) 0.5 MG tablet Take 0.5 mg by mouth as needed for anxiety.   Marland Kitchen amLODipine (NORVASC) 2.5 MG tablet Take 2.5 mg by mouth every other day. In the am  . aspirin 81 MG tablet Take 81 mg by mouth daily.  . Biotin 5000 MCG CAPS Take 5,000 mcg by mouth daily.  . digoxin (LANOXIN) 0.25 MG tablet Take 0.25 mg by mouth every other day.   Marland Kitchen doxepin (SINEQUAN) 100 MG capsule Take 100 mg by mouth at bedtime.   . fexofenadine (ALLEGRA) 60 MG tablet Take 30 mg by mouth daily.   Marland Kitchen gabapentin (NEURONTIN) 300 MG capsule Take 1 capsule (300 mg total) by mouth 3 (three) times daily.  Marland Kitchen HYDROcodone-acetaminophen (NORCO/VICODIN) 5-325 MG tablet Take 1 tablet by mouth every 6 (six) hours as needed for moderate pain.  Marland Kitchen lubiprostone (AMITIZA) 24 MCG capsule Take 1 capsule (24 mcg total) by mouth 2 (two) times daily with a meal.  . naproxen (NAPROSYN) 500 MG tablet Take 500 mg by mouth daily.   Marland Kitchen omeprazole (PRILOSEC) 20 MG capsule Take 20 mg by mouth every morning.  . pravastatin (PRAVACHOL) 20 MG tablet Take 20 mg by mouth every other day.   . quiNINE (QUALAQUIN) 324 MG capsule Take 324 mg by mouth at bedtime.   . senna (SENOKOT) 8.6 MG tablet Take 5 tablets (  43 mg total) by mouth daily. (Patient taking differently: Take 9 tablets by mouth at bedtime. )  . [DISCONTINUED] albuterol (PROVENTIL) (2.5 MG/3ML) 0.083% nebulizer solution Take 2.5 mg by nebulization 2 (two) times daily as needed for wheezing or shortness of breath.  . [DISCONTINUED] methylPREDNISolone (MEDROL) 4 MG tablet Medrol dose pack. Take as instructed (Patient not taking: Reported on 06/14/2018)   No facility-administered encounter medications on file as of 06/14/2018.      Objective: Blood pressure 128/81, pulse 70, temperature 97.7 F (36.5 C), temperature source Oral, resp. rate 18, height 5' 7.5" (1.715 m),  weight 176 lb 3.2 oz (79.9 kg). Patient is alert and in no acute distress. Conjunctiva is pink. Sclera is nonicteric Oropharyngeal mucosa is normal. No neck masses or thyromegaly noted. Cardiac exam with regular rhythm normal S1 and S2. No murmur or gallop noted. Lungs are clear to auscultation. Abdomen is symmetrical.  She has a vertical scar in right upper mid abdomen.  Bowel sounds are normal.  On palpation abdomen is soft.  She has mild tenderness below into the left of umbilicus on deep palpation but no mass noted. No LE edema or clubbing noted.   Assessment:  #1.  Chronic constipation.  She is dependent on high-dose stimulant laxatives.  All other therapies have had marginal benefit.  Lubiprostone/Amitiza is not helping and therefore needs to be discontinued.  Will try her on prucalopride is 5HT-4 agonist.  If it works she can try decreasing senna dose.  #2.  Left-sided abdominal pain.  She has had this pain for few years.  She was pain-free for a while and now the pain is back.  It may be due to her constipation.  If she has another episode of severe pain she should immediately report to emergency room where she can be evaluated while she is symptomatic.   Plan:  Patient advised to take Naprosyn with food or snack. Discontinue lubiprostone. Begin procalopride 2 mg every morning.  1 week supply given to the patient.  If it works well send in a prescription or try to obtain more samples. Office visit in 6 months.

## 2018-06-25 ENCOUNTER — Ambulatory Visit (HOSPITAL_COMMUNITY)
Admission: RE | Admit: 2018-06-25 | Discharge: 2018-06-25 | Disposition: A | Discharge status: Home / Self Care | Payer: PPO | Source: Ambulatory Visit | Attending: Orthopaedic Surgery | Admitting: Orthopaedic Surgery

## 2018-06-25 ENCOUNTER — Other Ambulatory Visit: Payer: Self-pay

## 2018-06-25 DIAGNOSIS — S32048A Other fracture of fourth lumbar vertebra, initial encounter for closed fracture: Secondary | ICD-10-CM | POA: Diagnosis not present

## 2018-06-25 DIAGNOSIS — Z96641 Presence of right artificial hip joint: Secondary | ICD-10-CM | POA: Diagnosis not present

## 2018-06-28 ENCOUNTER — Ambulatory Visit (HOSPITAL_COMMUNITY): Payer: PPO

## 2018-07-01 ENCOUNTER — Encounter: Payer: Self-pay | Admitting: Orthopaedic Surgery

## 2018-07-01 ENCOUNTER — Other Ambulatory Visit: Payer: Self-pay

## 2018-07-01 ENCOUNTER — Ambulatory Visit (INDEPENDENT_AMBULATORY_CARE_PROVIDER_SITE_OTHER): Payer: PPO | Admitting: Orthopaedic Surgery

## 2018-07-01 DIAGNOSIS — M25512 Pain in left shoulder: Secondary | ICD-10-CM

## 2018-07-01 DIAGNOSIS — M5441 Lumbago with sciatica, right side: Secondary | ICD-10-CM

## 2018-07-01 DIAGNOSIS — G8929 Other chronic pain: Secondary | ICD-10-CM | POA: Diagnosis not present

## 2018-07-01 NOTE — Progress Notes (Signed)
The patient comes in today for follow-up to go over an MRI of her right knee.  And her lumbar spine.  She has had a history of a hip replacement.  The MRI did not show any complicating feature of the hip replacement and she does not really hurt in the hip itself.  All of her pain is across the lower back medially and laterally and it does radiate into the sciatic region and down her leg even to the top of her foot.  She has been very uncomfortable with this.  We have seen her for her left shoulder as well and provided steroid injection subacromial and left shoulder.  She has known glenohumeral arthritis is not severe but is painful in her left shoulder joint.  Went over the MRI findings with her in detail of her lumbar spine today.  She has multifactorial spondylosis at multiple levels but it is quite severe to the right-sided L3-L4 which is likely causing nerve root irritation in this area.  She does have facet arthritis at multiple levels as well.  On exam she has excellent flexion-extension of her lumbar spine and she is very mobile in general she just hurts quite a bit.  She has excellent strength in her lower extremities well which is some numbness on the top of her right foot.  At this point she already has an appointment with Dr. Ernestina Patches on June 9.  This will likely be for an intervention for her lumbar spine to the right-sided L3-L4.  She is not a diabetic so hopefully in the same setting we can have him perform an intra-articular steroid injection in her left shoulder joint.  All question concerns were answered addressed.  I will see her back myself in 6 weeks.

## 2018-07-06 ENCOUNTER — Encounter: Payer: Self-pay | Admitting: Physical Medicine and Rehabilitation

## 2018-07-06 ENCOUNTER — Ambulatory Visit (INDEPENDENT_AMBULATORY_CARE_PROVIDER_SITE_OTHER): Payer: PPO | Admitting: Physical Medicine and Rehabilitation

## 2018-07-06 ENCOUNTER — Ambulatory Visit: Payer: Self-pay

## 2018-07-06 ENCOUNTER — Other Ambulatory Visit: Payer: Self-pay

## 2018-07-06 VITALS — BP 134/83 | HR 70

## 2018-07-06 DIAGNOSIS — M25551 Pain in right hip: Secondary | ICD-10-CM | POA: Diagnosis not present

## 2018-07-06 DIAGNOSIS — G8929 Other chronic pain: Secondary | ICD-10-CM

## 2018-07-06 DIAGNOSIS — M5416 Radiculopathy, lumbar region: Secondary | ICD-10-CM | POA: Diagnosis not present

## 2018-07-06 DIAGNOSIS — M25512 Pain in left shoulder: Secondary | ICD-10-CM

## 2018-07-06 MED ORDER — BETAMETHASONE SOD PHOS & ACET 6 (3-3) MG/ML IJ SUSP
12.0000 mg | Freq: Once | INTRAMUSCULAR | Status: AC
  Administered 2018-07-06: 12 mg

## 2018-07-06 NOTE — Progress Notes (Signed)
   Numeric Pain Rating Scale and Functional Assessment Average Pain 10   In the last MONTH (on 0-10 scale) has pain interfered with the following?  1. General activity like being  able to carry out your everyday physical activities such as walking, climbing stairs, carrying groceries, or moving a chair?  Rating(8)   +Driver, -BT, -Dye Allergies.  

## 2018-07-06 NOTE — Progress Notes (Addendum)
LATIQUA DALOIA - 83 y.o. female MRN [id number]  Date of birth: [birth year]  Office Visit Note: Visit Date: 07/06/2018 PCP: Sinda Du, MD Referred by: Sinda Du, MD  Subjective: Chief Complaint  Patient presents with   Lower Back - Pain   Left Shoulder - Pain   Right Hip - Pain   HPI: MALONE ADMIRE is a 83 y.o. female who comes in today At the request of Dr. Jean Rosenthal for right L3 transforaminal injection for chronic worsening right hip and leg pain.  She is failed conservative care and has MRI shown below and we talked about this at length with her.  She also is reporting left shoulder pain and requests was given for left intra-articular glenohumeral joint injection.  This would be a diagnostic medically therapeutic anesthetic arthrogram.  ROS Otherwise per HPI.  Assessment & Plan: Visit Diagnoses:  1. Lumbar radiculopathy   2. Chronic left shoulder pain     Plan: No additional findings.   Meds & Orders:  Meds ordered this encounter  Medications   betamethasone acetate-betamethasone sodium phosphate (CELESTONE) injection 12 mg   bupivacaine (MARCAINE) 0.5 % (with pres) injection 3 mL   triamcinolone acetonide (KENALOG-40) injection 80 mg    Orders Placed This Encounter  Procedures   Large Joint Inj: L glenohumeral   XR C-ARM NO REPORT   Epidural Steroid injection    Follow-up: Return if symptoms worsen or fail to improve, for Jean Rosenthal, M.D..   Procedures: Large Joint Inj: L glenohumeral on 07/06/2018 11:19 AM Indications: pain and diagnostic evaluation Details: 22 G 3.5 in needle, fluoroscopy-guided anteromedial approach  Arthrogram: No  Medications: 3 mL bupivacaine 0.5 %; 80 mg triamcinolone acetonide 40 MG/ML Outcome: tolerated well, no immediate complications  There was excellent flow of contrast producing a partial arthrogram of the glenohumeral joint. The patient did have relief of symptoms during the anesthetic  phase of the injection. Procedure, treatment alternatives, risks and benefits explained, specific risks discussed. Consent was given by the patient. Immediately prior to procedure a time out was called to verify the correct patient, procedure, equipment, support staff and site/side marked as required. Patient was prepped and draped in the usual sterile fashion.      Lumbosacral Transforaminal Epidural Steroid Injection - Sub-Pedicular Approach with Fluoroscopic Guidance  Patient: [name]      Date of Birth: 1935-12-11 MRN: [id number] PCP: Sinda Du, MD      Visit Date: 07/06/2018   Universal Protocol:    Date/Time: 07/06/2018  Consent Given By: the patient  Position: PRONE  Additional Comments: Vital signs were monitored before and after the procedure. Patient was prepped and draped in the usual sterile fashion. The correct patient, procedure, and site was verified.   Injection Procedure Details:  Procedure Site One Meds Administered:  Meds ordered this encounter  Medications   betamethasone acetate-betamethasone sodium phosphate (CELESTONE) injection 12 mg    Laterality: Right  Location/Site:  L3-L4  Needle size: 11 G  Needle type: Spinal  Needle Placement: Transforaminal  Findings:    -Comments: Excellent flow of contrast along the nerve and into the epidural space.  Procedure Details: After squaring off the end-plates to get a true AP view, the C-arm was positioned so that an oblique view of the foramen as noted above was visualized. The target area is just inferior to the "nose of the scotty dog" or sub pedicular. The soft tissues overlying this structure were infiltrated with 2-3 ml. of  1% Lidocaine without Epinephrine.  The spinal needle was inserted toward the target using a "trajectory" view along the fluoroscope beam.  Under AP and lateral visualization, the needle was advanced so it did not puncture dura and was located close the 6 O'Clock  position of the pedical in AP tracterory. Biplanar projections were used to confirm position. Aspiration was confirmed to be negative for CSF and/or blood. A 1-2 ml. volume of Isovue-250 was injected and flow of contrast was noted at each level. Radiographs were obtained for documentation purposes.   After attaining the desired flow of contrast documented above, a 0.5 to 1.0 ml test dose of 0.25% Marcaine was injected into each respective transforaminal space.  The patient was observed for 90 seconds post injection.  After no sensory deficits were reported, and normal lower extremity motor function was noted,   the above injectate was administered so that equal amounts of the injectate were placed at each foramen (level) into the transforaminal epidural space.   Additional Comments:  The patient tolerated the procedure well Dressing: 2 x 2 sterile gauze and Band-Aid    Post-procedure details: Patient was observed during the procedure. Post-procedure instructions were reviewed.  Patient left the clinic in stable condition.    Clinical History: MRI LUMBAR SPINE WITHOUT CONTRAST  TECHNIQUE: Multiplanar, multisequence MR imaging of the lumbar spine was performed. No intravenous contrast was administered.  COMPARISON:  CT of the abdomen and pelvis 10/04/2014  FINDINGS: Segmentation: 5 non rib-bearing lumbar type vertebral bodies are present. The lowest fully formed vertebral body is L5.  Alignment: 3-4 mm retrolisthesis is present at L2-3 and at L3-4. AP alignment is otherwise anatomic. Levoconvex curvature of the lumbar spine is centered at L3.  Vertebrae: Chronic endplate sclerotic changes are again noted at L1-2. Chronic endplate changes are more prominent on the right at L2-3 and L3-4. Schmorl's nodes are noted as well. An inferior endplate Schmorl's node is present on the right at L4-5. Mild endplate changes are noted posteriorly and on the left at L5-S1.  Conus  medullaris and cauda equina: Conus extends to the L1 level. Conus and cauda equina appear normal.  Paraspinal and other soft tissues: Limited imaging the abdomen is unremarkable. There is no significant adenopathy. No solid organ lesions are present.  Disc levels:  T12-L1: Leftward disc bulging is present without significant stenosis.  L1-2: There is chronic loss of disc height. Central canal is patent. Foraminal narrowing is worse on the left.  L2-3: A broad-based disc protrusion is asymmetric to the right. Facet hypertrophy is noted bilaterally. Moderate right and mild left subarticular stenosis is present. Left laminectomy is noted. Posterior canal is decompressed. Moderate foraminal narrowing is worse on the right.  L3-4: A broad-based disc protrusion is present. Facet hypertrophy is noted bilaterally. Moderate right subarticular and severe right foraminal stenosis is present. Mild left subarticular and moderate left foraminal stenosis is present.  L4-5: A broad-based disc protrusion is present. Moderate facet hypertrophy is noted bilaterally. Central canal is patent. Moderate foraminal narrowing is worse on the left.  L5-S1: A broad-based disc protrusion is present. Moderate facet hypertrophy is noted bilaterally. Mild left subarticular narrowing is present. Moderate to severe foraminal stenosis is worse on the left.  IMPRESSION: 1. Scoliosis and multilevel spondylosis of the lumbar spine as described. 2. Right-sided disease is most significant at L3-4 with moderate right subarticular and severe right foraminal stenosis. 3. Left-sided disease is greatest at L5-S1 with moderate to severe left foraminal stenosis.  Electronically Signed   By: San Morelle M.D.   On: 03/24/2018 13:47   She reports that she has been smoking cigarettes. She has a 12.50 pack-year smoking history. She has never used smokeless tobacco. No results for input(s): HGBA1C,  LABURIC in the last 8760 hours.  Objective:  VS:  HT:     WT:    BMI:      BP:134/83   HR:70bpm   TEMP: ( )   RESP:  Physical Exam  Ortho Exam Imaging: No results found.  Past Medical/Family/Surgical/Social History: Medications & Allergies reviewed per EMR, new medications updated. Patient Active Problem List   Diagnosis Date Noted   History of colonic polyps 10/22/2016   SBO (small bowel obstruction) (HCC) 03/02/2013   Hyperlipidemia 12/08/2012   Gout 12/08/2012   Dysrhythmia 12/08/2012   Depression 12/08/2012   Asthma 12/08/2012   Fibromyalgia 12/08/2012   Constipation 12/08/2012   Acute posthemorrhagic anemia 12/07/2012   Essential hypertension, benign 12/07/2012   Degenerative arthritis of hip, left 11/05/2012   Arthritis pain of hip 09/14/2012   Weakness of right leg 03/24/2011   OA (osteoarthritis) 03/24/2011   Stiffness of cervical spine 03/24/2011   Chronic constipation 11/19/2010   GERD (gastroesophageal reflux disease) 11/19/2010   SPINAL STENOSIS 04/18/2009   HIP PAIN 11/17/2007   DEGEN LUMBAR/LUMBOSACRAL INTERVERTEBRAL [place] 11/17/2007   ANSERINE BURSITIS, RIGHT 08/11/2007   SCIATICA 06/22/2007   KNEE PAIN 03/17/2007   Past Medical History:  Diagnosis Date   Abdominal discomfort 11-02-12   suspected ?UTI- PCP MD placed on Cipro   Anxiety    Arthritis    Asthma    Cancer ([facility])    skin CA   Chronic constipation    Constipation    Cough    Depression    Dysrhythmia    tachycardia   Environmental allergies    Fibromyalgia    GERD (gastroesophageal reflux disease)    Hemorrhoids    High cholesterol    Hypercholesteremia    Hypertension    Nausea    Pneumonia    7+ YRS   Urgency of urination    Family History  Problem Relation Age of Onset   Healthy Son    Healthy Son    Healthy Daughter    Colon cancer Neg Hx    Past Surgical History:  Procedure Laterality Date   ANAL RECTAL MANOMETRY N/A  11/28/2013   Procedure: ANO RECTAL MANOMETRY;  Surgeon: Leighton Ruff, MD;  Location: [facility];  Service: Endoscopy;  Laterality: N/A;   APPENDECTOMY     BACK SURGERY     X2    CHOLECYSTECTOMY     COLONOSCOPY  03/29/03   COLONOSCOPY N/A 12/09/2013   Procedure: COLONOSCOPY;  Surgeon: Rogene Houston, MD;  Location: [facility];  Service: Endoscopy;  Laterality: N/A;  730   COLONOSCOPY N/A 02/04/2017   Procedure: COLONOSCOPY;  Surgeon: Rogene Houston, MD;  Location: [facility];  Service: Endoscopy;  Laterality: N/A;  930   EYE SURGERY     bilateral cataract removal   SKIN CANCER EXCISION     RIGHT NECK     TONSILLECTOMY     T+A   TOTAL HIP ARTHROPLASTY Right 09/14/2012   Procedure: RIGHT TOTAL HIP ARTHROPLASTY ANTERIOR APPROACH and RIGHT KNEE STEROID INJECTION;  Surgeon: Mcarthur Rossetti, MD;  Location: [facility];  Service: Orthopedics;  Laterality: Right;   TOTAL HIP ARTHROPLASTY Left 11/05/2012   Procedure: LEFT TOTAL HIP ARTHROPLASTY ANTERIOR APPROACH;  Surgeon: Mcarthur Rossetti, MD;  Location: [facility];  Service: Orthopedics;  Laterality: Left;   UMBILICAL HERNIA REPAIR  04/26/03   JENKINS   UPPER GASTROINTESTINAL ENDOSCOPY  08/03/2009   NUR   UPPER GASTROINTESTINAL ENDOSCOPY  03/29/2003   EGD ED TCS   Social History   Occupational History   Not on file  Tobacco Use   Smoking status: Current Some Day Smoker    Packs/day: 0.25    Years: 50.00    Pack years: 12.50    Types: Cigarettes   Smokeless tobacco: Never Used   Tobacco comment: Hasn't smoked in 5-6 weeks  Substance and Sexual Activity   Alcohol use: Yes    Alcohol/week: 0.0 standard drinks    Comment: rare   Drug use: No   Sexual activity: Not Currently    Birth control/protection: Post-menopausal

## 2018-07-13 DIAGNOSIS — H04123 Dry eye syndrome of bilateral lacrimal glands: Secondary | ICD-10-CM | POA: Diagnosis not present

## 2018-07-13 DIAGNOSIS — H40013 Open angle with borderline findings, low risk, bilateral: Secondary | ICD-10-CM | POA: Diagnosis not present

## 2018-07-19 ENCOUNTER — Other Ambulatory Visit (HOSPITAL_COMMUNITY): Payer: Self-pay | Admitting: Pulmonary Disease

## 2018-07-19 ENCOUNTER — Other Ambulatory Visit: Payer: Self-pay

## 2018-07-19 ENCOUNTER — Ambulatory Visit (HOSPITAL_COMMUNITY)
Admission: RE | Admit: 2018-07-19 | Discharge: 2018-07-19 | Disposition: A | Discharge status: Home / Self Care | Payer: PPO | Source: Ambulatory Visit | Attending: Pulmonary Disease | Admitting: Pulmonary Disease

## 2018-07-19 DIAGNOSIS — R52 Pain, unspecified: Secondary | ICD-10-CM | POA: Diagnosis not present

## 2018-07-19 DIAGNOSIS — I700 Atherosclerosis of aorta: Secondary | ICD-10-CM | POA: Diagnosis not present

## 2018-07-20 ENCOUNTER — Ambulatory Visit (INDEPENDENT_AMBULATORY_CARE_PROVIDER_SITE_OTHER): Payer: PPO | Admitting: Internal Medicine

## 2018-07-21 MED ORDER — TRIAMCINOLONE ACETONIDE 40 MG/ML IJ SUSP
80.0000 mg | INTRAMUSCULAR | Status: AC | PRN
  Administered 2018-07-06: 80 mg via INTRA_ARTICULAR

## 2018-07-21 MED ORDER — BUPIVACAINE HCL 0.5 % IJ SOLN
3.0000 mL | INTRAMUSCULAR | Status: AC | PRN
  Administered 2018-07-06: 3 mL via INTRA_ARTICULAR

## 2018-07-21 NOTE — Procedures (Signed)
Lumbosacral Transforaminal Epidural Steroid Injection - Sub-Pedicular Approach with Fluoroscopic Guidance  Patient: Zoe [name]      Date of Birth: Jul 04, 1935 MRN: [id number] PCP: Sinda Du, MD      Visit Date: 07/06/2018   Universal Protocol:    Date/Time: 07/06/2018  Consent Given By: the patient  Position: PRONE  Additional Comments: Vital signs were monitored before and after the procedure. Patient was prepped and draped in the usual sterile fashion. The correct patient, procedure, and site was verified.   Injection Procedure Details:  Procedure Site One Meds Administered:  Meds ordered this encounter  Medications  . betamethasone acetate-betamethasone sodium phosphate (CELESTONE) injection 12 mg    Laterality: Right  Location/Site:  L3-L4  Needle size: 22 G  Needle type: Spinal  Needle Placement: Transforaminal  Findings:    -Comments: Excellent flow of contrast along the nerve and into the epidural space.  Procedure Details: After squaring off the end-plates to get a true AP view, the C-arm was positioned so that an oblique view of the foramen as noted above was visualized. The target area is just inferior to the "nose of the scotty dog" or sub pedicular. The soft tissues overlying this structure were infiltrated with 2-3 ml. of 1% Lidocaine without Epinephrine.  The spinal needle was inserted toward the target using a "trajectory" view along the fluoroscope beam.  Under AP and lateral visualization, the needle was advanced so it did not puncture dura and was located close the 6 O'Clock position of the pedical in AP tracterory. Biplanar projections were used to confirm position. Aspiration was confirmed to be negative for CSF and/or blood. A 1-2 ml. volume of Isovue-250 was injected and flow of contrast was noted at each level. Radiographs were obtained for documentation purposes.   After attaining the desired flow of contrast documented above, a 0.5  to 1.0 ml test dose of 0.25% Marcaine was injected into each respective transforaminal space.  The patient was observed for 90 seconds post injection.  After no sensory deficits were reported, and normal lower extremity motor function was noted,   the above injectate was administered so that equal amounts of the injectate were placed at each foramen (level) into the transforaminal epidural space.   Additional Comments:  The patient tolerated the procedure well Dressing: 2 x 2 sterile gauze and Band-Aid    Post-procedure details: Patient was observed during the procedure. Post-procedure instructions were reviewed.  Patient left the clinic in stable condition.

## 2018-07-22 ENCOUNTER — Ambulatory Visit: Payer: Self-pay | Admitting: Physician Assistant

## 2018-08-11 DIAGNOSIS — M545 Low back pain: Secondary | ICD-10-CM | POA: Diagnosis not present

## 2018-08-11 DIAGNOSIS — J449 Chronic obstructive pulmonary disease, unspecified: Secondary | ICD-10-CM | POA: Diagnosis not present

## 2018-08-11 DIAGNOSIS — K5900 Constipation, unspecified: Secondary | ICD-10-CM | POA: Diagnosis not present

## 2018-08-11 DIAGNOSIS — F419 Anxiety disorder, unspecified: Secondary | ICD-10-CM | POA: Diagnosis not present

## 2018-08-12 ENCOUNTER — Ambulatory Visit: Payer: PPO | Admitting: Physical Medicine and Rehabilitation

## 2018-08-12 ENCOUNTER — Ambulatory Visit: Payer: PPO | Admitting: Orthopaedic Surgery

## 2018-08-31 DIAGNOSIS — J8410 Pulmonary fibrosis, unspecified: Secondary | ICD-10-CM | POA: Diagnosis not present

## 2018-08-31 DIAGNOSIS — R600 Localized edema: Secondary | ICD-10-CM | POA: Diagnosis not present

## 2018-08-31 DIAGNOSIS — R5383 Other fatigue: Secondary | ICD-10-CM | POA: Diagnosis not present

## 2018-08-31 DIAGNOSIS — R808 Other proteinuria: Secondary | ICD-10-CM | POA: Diagnosis not present

## 2018-08-31 DIAGNOSIS — R635 Abnormal weight gain: Secondary | ICD-10-CM | POA: Diagnosis not present

## 2018-08-31 DIAGNOSIS — R609 Edema, unspecified: Secondary | ICD-10-CM | POA: Diagnosis not present

## 2018-08-31 DIAGNOSIS — R0602 Shortness of breath: Secondary | ICD-10-CM | POA: Diagnosis not present

## 2018-09-23 ENCOUNTER — Ambulatory Visit: Payer: PPO | Admitting: Physician Assistant

## 2018-09-27 ENCOUNTER — Other Ambulatory Visit (HOSPITAL_COMMUNITY): Payer: Self-pay | Admitting: Pulmonary Disease

## 2018-09-27 ENCOUNTER — Other Ambulatory Visit: Payer: Self-pay | Admitting: Pulmonary Disease

## 2018-09-27 DIAGNOSIS — R609 Edema, unspecified: Secondary | ICD-10-CM

## 2018-09-27 DIAGNOSIS — R1900 Intra-abdominal and pelvic swelling, mass and lump, unspecified site: Secondary | ICD-10-CM

## 2018-09-27 DIAGNOSIS — F419 Anxiety disorder, unspecified: Secondary | ICD-10-CM | POA: Diagnosis not present

## 2018-09-27 DIAGNOSIS — J449 Chronic obstructive pulmonary disease, unspecified: Secondary | ICD-10-CM | POA: Diagnosis not present

## 2018-09-27 DIAGNOSIS — M545 Low back pain: Secondary | ICD-10-CM | POA: Diagnosis not present

## 2018-09-27 DIAGNOSIS — R739 Hyperglycemia, unspecified: Secondary | ICD-10-CM | POA: Diagnosis not present

## 2018-09-27 DIAGNOSIS — I10 Essential (primary) hypertension: Secondary | ICD-10-CM | POA: Diagnosis not present

## 2018-09-29 ENCOUNTER — Ambulatory Visit (INDEPENDENT_AMBULATORY_CARE_PROVIDER_SITE_OTHER): Payer: PPO | Admitting: Physician Assistant

## 2018-09-29 ENCOUNTER — Encounter: Payer: Self-pay | Admitting: Physician Assistant

## 2018-09-29 ENCOUNTER — Other Ambulatory Visit: Payer: Self-pay

## 2018-09-29 VITALS — Ht 67.5 in

## 2018-09-29 DIAGNOSIS — M7061 Trochanteric bursitis, right hip: Secondary | ICD-10-CM

## 2018-09-29 MED ORDER — METHYLPREDNISOLONE ACETATE 40 MG/ML IJ SUSP
40.0000 mg | INTRAMUSCULAR | Status: AC | PRN
  Administered 2018-09-29: 40 mg via INTRA_ARTICULAR

## 2018-09-29 MED ORDER — LIDOCAINE HCL 1 % IJ SOLN
3.0000 mL | INTRAMUSCULAR | Status: AC | PRN
  Administered 2018-09-29: 3 mL

## 2018-09-29 NOTE — Progress Notes (Signed)
HPI: [name] is comes in today with right hip pain and swelling.  However she states she does not have any swelling today.  She also notes that she continues to have lumps and bumps over the lateral aspect of her right hip.  She says this is been ongoing since staples were removed from her right total hip arthroplasty.  She has had no new injury of the hip.  She has pain lateral aspect of the hip that radiates into the groin.  She is had no fever chills.  Review of systems see HPI otherwise negative.  Physical exam: Right hip excellent range of motion right hip without pain.  She has tenderness over the right hip trochanteric region.  Surgical incisions well-healed no signs of infection i.e. abnormal warmth, erythema, wound dehiscence or drainage.  Bilateral legs without edema.  Impression: Right hip trochanteric bursitis  Plan discussed with patient trochanteric injection she was agreeable notes.  In regards to the swelling problems problems told her that she can always take a picture and show it to [place] or come in whenever she is having the swelling going on.  However reassurance given that I see no signs of infection will have MRI of the hip earlier this summer and there is no evidence of infection at that time or any masses that were concerning.    Procedure Note  Patient: Zoe [name]             Date of Birth: 06/03/1935           MRN: [id number]             Visit Date: 09/29/2018  Procedures: Visit Diagnoses:  1. Trochanteric bursitis of right hip     Large Joint Inj: R greater trochanter on 09/29/2018 5:00 PM Indications: pain Details: 22 G 1.5 in needle, lateral approach  Arthrogram: No  Medications: 3 mL lidocaine 1 %; 40 mg methylPREDNISolone acetate 40 MG/ML Outcome: tolerated well, no immediate complications Procedure, treatment alternatives, risks and benefits explained, specific risks discussed. Consent was given by the patient. Immediately prior to procedure a time  out was called to verify the correct patient, procedure, equipment, support staff and site/side marked as required. Patient was prepped and draped in the usual sterile fashion.

## 2018-10-01 ENCOUNTER — Other Ambulatory Visit (HOSPITAL_COMMUNITY): Payer: PPO

## 2018-10-05 ENCOUNTER — Ambulatory Visit (HOSPITAL_BASED_OUTPATIENT_CLINIC_OR_DEPARTMENT_OTHER)
Admission: RE | Admit: 2018-10-05 | Discharge: 2018-10-05 | Disposition: A | Discharge status: Home / Self Care | Payer: PPO | Source: Ambulatory Visit | Attending: Pulmonary Disease | Admitting: Pulmonary Disease

## 2018-10-05 ENCOUNTER — Other Ambulatory Visit: Payer: Self-pay

## 2018-10-05 ENCOUNTER — Ambulatory Visit (HOSPITAL_COMMUNITY)
Admission: RE | Admit: 2018-10-05 | Discharge: 2018-10-05 | Disposition: A | Discharge status: Home / Self Care | Payer: PPO | Source: Ambulatory Visit | Attending: Pulmonary Disease | Admitting: Pulmonary Disease

## 2018-10-05 ENCOUNTER — Other Ambulatory Visit (HOSPITAL_COMMUNITY): Payer: PPO

## 2018-10-05 DIAGNOSIS — K219 Gastro-esophageal reflux disease without esophagitis: Secondary | ICD-10-CM | POA: Insufficient documentation

## 2018-10-05 DIAGNOSIS — R1900 Intra-abdominal and pelvic swelling, mass and lump, unspecified site: Secondary | ICD-10-CM

## 2018-10-05 DIAGNOSIS — E785 Hyperlipidemia, unspecified: Secondary | ICD-10-CM | POA: Insufficient documentation

## 2018-10-05 DIAGNOSIS — I10 Essential (primary) hypertension: Secondary | ICD-10-CM | POA: Diagnosis not present

## 2018-10-05 DIAGNOSIS — R609 Edema, unspecified: Secondary | ICD-10-CM

## 2018-10-05 NOTE — Progress Notes (Signed)
*  PRELIMINARY RESULTS* Echocardiogram 2D Echocardiogram has been performed.  Samuel [place] 10/05/2018, 2:02 PM

## 2018-10-12 ENCOUNTER — Other Ambulatory Visit: Payer: Self-pay

## 2018-10-12 DIAGNOSIS — F419 Anxiety disorder, unspecified: Secondary | ICD-10-CM | POA: Diagnosis not present

## 2018-10-12 DIAGNOSIS — Z20822 Contact with and (suspected) exposure to covid-19: Secondary | ICD-10-CM

## 2018-10-12 DIAGNOSIS — R6889 Other general symptoms and signs: Secondary | ICD-10-CM | POA: Diagnosis not present

## 2018-10-12 DIAGNOSIS — I5032 Chronic diastolic (congestive) heart failure: Secondary | ICD-10-CM | POA: Diagnosis not present

## 2018-10-12 DIAGNOSIS — M545 Low back pain: Secondary | ICD-10-CM | POA: Diagnosis not present

## 2018-10-12 DIAGNOSIS — J441 Chronic obstructive pulmonary disease with (acute) exacerbation: Secondary | ICD-10-CM | POA: Diagnosis not present

## 2018-10-13 LAB — NOVEL CORONAVIRUS, NAA: SARS-CoV-2, NAA: NOT DETECTED

## 2018-10-16 DIAGNOSIS — J4521 Mild intermittent asthma with (acute) exacerbation: Secondary | ICD-10-CM | POA: Diagnosis not present

## 2018-10-16 DIAGNOSIS — R05 Cough: Secondary | ICD-10-CM | POA: Diagnosis not present

## 2018-10-16 DIAGNOSIS — R0602 Shortness of breath: Secondary | ICD-10-CM | POA: Diagnosis not present

## 2018-11-09 ENCOUNTER — Telehealth: Payer: Self-pay | Admitting: Cardiovascular Disease

## 2018-11-09 NOTE — Telephone Encounter (Signed)

## 2018-11-10 ENCOUNTER — Ambulatory Visit: Payer: PPO | Admitting: Cardiovascular Disease

## 2018-11-10 ENCOUNTER — Encounter: Payer: Self-pay | Admitting: *Deleted

## 2018-11-10 ENCOUNTER — Encounter: Payer: Self-pay | Admitting: Cardiovascular Disease

## 2018-11-10 ENCOUNTER — Other Ambulatory Visit: Payer: Self-pay

## 2018-11-10 VITALS — BP 124/70 | HR 68 | Ht 67.5 in | Wt 182.8 lb

## 2018-11-10 DIAGNOSIS — I5032 Chronic diastolic (congestive) heart failure: Secondary | ICD-10-CM | POA: Diagnosis not present

## 2018-11-10 DIAGNOSIS — R600 Localized edema: Secondary | ICD-10-CM

## 2018-11-10 DIAGNOSIS — I872 Venous insufficiency (chronic) (peripheral): Secondary | ICD-10-CM

## 2018-11-10 NOTE — Patient Instructions (Addendum)
Your physician wants you to follow-up in: [place] will receive a reminder letter in the mail two months in advance. If you don't receive a letter, please call our office to schedule the follow-up appointment.  Your physician has recommended you make the following change in your medication:   STOP AMLODIPINE   WE Marshall have been referred to [facility]   Thank you for choosing [facility]!!

## 2018-11-10 NOTE — Progress Notes (Signed)
CARDIOLOGY CONSULT NOTE  Patient ID: Zoe [name] MRN: [id number] DOB/AGE: 1935-04-24 83 y.o.  Admit date: (Not on file) Primary Physician: Sinda Du, MD  Reason for Consultation: Diastolic heart failure  HPI: Zoe [name] is a 83 y.o. female who is being seen today for the evaluation of diastolic heart failure at the request of Sinda Du, MD.   Chest x-ray on 07/20/2018 showed no acute abnormalities.  Echocardiogram from 10/05/2018 demonstrated low normal LV systolic function, EF 50 to [id number], grade 1 diastolic dysfunction, and thickening and calcification of the aortic valve without stenosis.  I reviewed notes from her PCP.  Past medical history also includes hypertension and COPD.  She has diffuse arthritis.  Labs dated 09/28/2018: A1c 5.2%.  As per PCP notes on 09/27/2018, she was having problems with swelling in her legs.  She was noted to be on amlodipine.  She told me that her legs became bigger over the past few years.  She feels comfortable at a weight of about 170 pounds and presently weighs 182 pounds.  She has a history of right leg DVT about 40 years ago.  She denies chest pain, shortness of breath, orthopnea, and paroxysmal nocturnal dyspnea.  She takes Lasix 20 mg daily and occasionally takes 40 mg.  She has had chronic issues with constipation.  She used to have thigh-high compression stockings that she wore for several years but threw them away about 5 years ago.  She brought in additional labs and studies for me to review.  She went to an urgent care in [place].  Labs on 10/12/2018: Sodium 141, potassium 4.5, BUN 16, creatinine 0.92.  Abdominal ultrasound showed trace perisplenic ascites.  Social history: Married.  She is originally from [place], [place].  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  She lived in the [place] and [place], [place], area for 10 years.  They then lived in [place] for 15 years and have  lived in [place] for the past 20 years.   Allergies  Allergen Reactions   Codeine Other (See Comments)    Pains in abdominal area   Escitalopram Itching   Other     NOVACAINE     ITCHING Anti-depressants   Oxycodone     Abdominal pain   Paxil [Paroxetine Hcl] Itching   Procaine Itching    Current Outpatient Medications  Medication Sig Dispense Refill   albuterol (PROVENTIL HFA;VENTOLIN HFA) 108 (90 BASE) MCG/ACT inhaler Inhale 2 puffs into the lungs every 6 (six) hours as needed for wheezing or shortness of breath.     allopurinol (ZYLOPRIM) 300 MG tablet Take 300 mg by mouth daily.     ALPRAZolam (XANAX) 0.5 MG tablet Take 0.5 mg by mouth as needed for anxiety.      amLODipine (NORVASC) 2.5 MG tablet Take 2.5 mg by mouth every other day. In the am     aspirin 81 MG tablet Take 81 mg by mouth daily.     Biotin 5000 MCG CAPS Take 5,000 mcg by mouth daily.     brompheniramine-pseudoephedrine-DM 30-2-10 MG/5ML syrup Take 2.5 mLs by mouth 4 (four) times daily as needed.     digoxin (LANOXIN) 0.25 MG tablet Take 0.25 mg by mouth daily.      doxepin (SINEQUAN) 100 MG capsule Take 100 mg by mouth at bedtime.      fexofenadine (ALLEGRA) 60 MG tablet Take 30 mg by mouth daily.      furosemide (LASIX)  40 MG tablet Take 1 tablet by mouth daily.     gabapentin (NEURONTIN) 300 MG capsule Take 1 capsule (300 mg total) by mouth 3 (three) times daily. (Patient taking differently: Take 300 mg by mouth 2 (two) times daily. ) 60 capsule 3   ipratropium-albuterol (DUONEB) 0.5-2.5 (3) MG/3ML SOLN Inhale 3 mLs into the lungs every 4 (four) hours as needed.     Multiple Vitamin (MULTIVITAMIN) tablet Take 1 tablet by mouth daily.     naproxen (NAPROSYN) 500 MG tablet Take 500 mg by mouth daily.      omeprazole (PRILOSEC) 20 MG capsule Take 20 mg by mouth every morning.     pravastatin (PRAVACHOL) 20 MG tablet Take 20 mg by mouth every other day.      quiNINE (QUALAQUIN) 324  MG capsule Take 324 mg by mouth at bedtime.      senna (SENOKOT) 8.6 MG tablet Take 5 tablets (43 mg total) by mouth daily. (Patient taking differently: Take 9 tablets by mouth at bedtime. )     vitamin A 10000 UT capsule Take 1 capsule by mouth daily.     No current facility-administered medications for this visit.     Past Medical History:  Diagnosis Date   Abdominal discomfort 11-02-12   suspected ?UTI- PCP MD placed on Cipro   Anxiety    Arthritis    Asthma    Cancer ([facility])    skin CA   Chronic constipation    Constipation    Cough    Depression    Dysrhythmia    tachycardia   Environmental allergies    Fibromyalgia    GERD (gastroesophageal reflux disease)    Hemorrhoids    High cholesterol    Hypercholesteremia    Hypertension    Nausea    Pneumonia    7+ YRS   Urgency of urination     Past Surgical History:  Procedure Laterality Date   ANAL RECTAL MANOMETRY N/A 11/28/2013   Procedure: ANO RECTAL MANOMETRY;  Surgeon: Leighton Ruff, MD;  Location: [facility];  Service: Endoscopy;  Laterality: N/A;   APPENDECTOMY     BACK SURGERY     X2    CHOLECYSTECTOMY     COLONOSCOPY  03/29/03   COLONOSCOPY N/A 12/09/2013   Procedure: COLONOSCOPY;  Surgeon: Rogene Houston, MD;  Location: [facility];  Service: Endoscopy;  Laterality: N/A;  730   COLONOSCOPY N/A 02/04/2017   Procedure: COLONOSCOPY;  Surgeon: Rogene Houston, MD;  Location: [facility];  Service: Endoscopy;  Laterality: N/A;  930   EYE SURGERY     bilateral cataract removal   SKIN CANCER EXCISION     RIGHT NECK     TONSILLECTOMY     T+A   TOTAL HIP ARTHROPLASTY Right 09/14/2012   Procedure: RIGHT TOTAL HIP ARTHROPLASTY ANTERIOR APPROACH and RIGHT KNEE STEROID INJECTION;  Surgeon: Mcarthur Rossetti, MD;  Location: [facility];  Service: Orthopedics;  Laterality: Right;   TOTAL HIP ARTHROPLASTY Left 11/05/2012   Procedure: LEFT TOTAL HIP ARTHROPLASTY ANTERIOR APPROACH;   Surgeon: Mcarthur Rossetti, MD;  Location: [facility];  Service: Orthopedics;  Laterality: Left;   UMBILICAL HERNIA REPAIR  04/26/03   JENKINS   UPPER GASTROINTESTINAL ENDOSCOPY  08/03/2009   NUR   UPPER GASTROINTESTINAL ENDOSCOPY  03/29/2003   EGD ED TCS    Social History   Socioeconomic History   Marital status: Married    Spouse name: Not on file  Number of children: Not on file   Years of education: Not on file   Highest education level: Not on file  Occupational History   Not on file  Social Needs   Financial resource strain: Not on file   Food insecurity    Worry: Not on file    Inability: Not on file   Transportation needs    Medical: Not on file    Non-medical: Not on file  Tobacco Use   Smoking status: Former Smoker    Packs/day: 0.25    Years: 50.00    Pack years: 12.50    Types: Cigarettes    Quit date: 11/10/2018   Smokeless tobacco: Never Used   Tobacco comment: Hasn't smoked in 5-6 weeks  Substance and Sexual Activity   Alcohol use: Yes    Alcohol/week: 0.0 standard drinks    Comment: rare   Drug use: No   Sexual activity: Not Currently    Birth control/protection: Post-menopausal  Lifestyle   Physical activity    Days per week: Not on file    Minutes per session: Not on file   Stress: Not on file  Relationships   Social connections    Talks on phone: Not on file    Gets together: Not on file    Attends religious service: Not on file    Active member of club or organization: Not on file    Attends meetings of clubs or organizations: Not on file    Relationship status: Not on file   Intimate partner violence    Fear of current or ex partner: Not on file    Emotionally abused: Not on file    Physically abused: Not on file    Forced sexual activity: Not on file  Other Topics Concern   Not on file  Social History Narrative   Not on file     No family history of premature CAD in 1st degree relatives.  Current Meds   Medication Sig   albuterol (PROVENTIL HFA;VENTOLIN HFA) 108 (90 BASE) MCG/ACT inhaler Inhale 2 puffs into the lungs every 6 (six) hours as needed for wheezing or shortness of breath.   allopurinol (ZYLOPRIM) 300 MG tablet Take 300 mg by mouth daily.   ALPRAZolam (XANAX) 0.5 MG tablet Take 0.5 mg by mouth as needed for anxiety.    amLODipine (NORVASC) 2.5 MG tablet Take 2.5 mg by mouth every other day. In the am   aspirin 81 MG tablet Take 81 mg by mouth daily.   Biotin 5000 MCG CAPS Take 5,000 mcg by mouth daily.   brompheniramine-pseudoephedrine-DM 30-2-10 MG/5ML syrup Take 2.5 mLs by mouth 4 (four) times daily as needed.   digoxin (LANOXIN) 0.25 MG tablet Take 0.25 mg by mouth daily.    doxepin (SINEQUAN) 100 MG capsule Take 100 mg by mouth at bedtime.    fexofenadine (ALLEGRA) 60 MG tablet Take 30 mg by mouth daily.    furosemide (LASIX) 40 MG tablet Take 1 tablet by mouth daily.   gabapentin (NEURONTIN) 300 MG capsule Take 1 capsule (300 mg total) by mouth 3 (three) times daily. (Patient taking differently: Take 300 mg by mouth 2 (two) times daily. )   ipratropium-albuterol (DUONEB) 0.5-2.5 (3) MG/3ML SOLN Inhale 3 mLs into the lungs every 4 (four) hours as needed.   Multiple Vitamin (MULTIVITAMIN) tablet Take 1 tablet by mouth daily.   naproxen (NAPROSYN) 500 MG tablet Take 500 mg by mouth daily.    omeprazole (PRILOSEC) 20 MG  capsule Take 20 mg by mouth every morning.   pravastatin (PRAVACHOL) 20 MG tablet Take 20 mg by mouth every other day.    quiNINE (QUALAQUIN) 324 MG capsule Take 324 mg by mouth at bedtime.    senna (SENOKOT) 8.6 MG tablet Take 5 tablets (43 mg total) by mouth daily. (Patient taking differently: Take 9 tablets by mouth at bedtime. )   vitamin A 10000 UT capsule Take 1 capsule by mouth daily.      Review of systems complete and found to be negative unless listed above in HPI    Physical exam Blood pressure 124/70, pulse 68, height 5' 7.5"  (1.715 m), weight 182 lb 12.8 oz (82.9 kg), SpO2 98 %. General: NAD Neck: No JVD, no thyromegaly or thyroid nodule.  Lungs: Clear to auscultation bilaterally with normal respiratory effort. CV: Nondisplaced PMI. Regular rate and rhythm, normal S1/S2, no S3/S4, no murmur.  Bilateral lymph edema with significant adipose tissue in the thighs just above and below the knees.  There is no significant pretibial nor peri-ankle edema.  No carotid bruit.    Abdomen: Soft, nontender, no distention.  Skin: Intact without lesions or rashes.  Neurologic: Alert and oriented x 3.  Psych: Normal affect. Extremities: No clubbing or cyanosis.  HEENT: Normal.   ECG: Most recent ECG reviewed.   Labs: Lab Results  Component Value Date/Time   K 3.9 10/04/2014 10:30 AM   BUN 22 (H) 10/04/2014 10:30 AM   CREATININE 0.90 09/22/2017 11:00 AM   ALT 17 10/04/2014 10:30 AM   HGB 14.9 10/04/2014 10:30 AM     Lipids: No results found for: LDLCALC, LDLDIRECT, CHOL, TRIG, HDL      ASSESSMENT AND PLAN:  1.  Chronic diastolic heart failure: She has grade 1 diastolic dysfunction.  She takes Lasix 20 mg daily and occasionally takes 40 mg.  She has problems with chronic constipation.  2.  Bilateral leg edema: I cannot attribute this purely to grade 1 diastolic dysfunction.  I will have her stop amlodipine altogether as she is only taking 2.5 mg and I doubt this is providing much antihypertensive benefit.  I will prescribe thigh-high compression stockings 15-20 mmHg to start.  I will also make a referral to vascular surgery to evaluate for venous reflux disease, although I suspect thigh-high compression stockings would still be the treatment of choice.  I am not certain that she would benefit from compression devices for lymphedema but this could also be considered.   Disposition: Follow up in 6 months  Signed: Kate Sable, M.D., F.A.C.C.  11/10/2018, 8:52 AM

## 2018-11-15 ENCOUNTER — Telehealth: Payer: Self-pay | Admitting: Physical Medicine and Rehabilitation

## 2018-11-15 NOTE — Telephone Encounter (Signed)
ok 

## 2018-11-16 NOTE — Telephone Encounter (Signed)
Scheduled for 11/6 at 1000.

## 2018-11-18 DIAGNOSIS — L820 Inflamed seborrheic keratosis: Secondary | ICD-10-CM | POA: Diagnosis not present

## 2018-11-22 ENCOUNTER — Telehealth (INDEPENDENT_AMBULATORY_CARE_PROVIDER_SITE_OTHER): Payer: Self-pay | Admitting: Internal Medicine

## 2018-11-22 NOTE — Telephone Encounter (Signed)
Patient left voice mail message wanting a call back regarding her medication - ph# [phone]

## 2018-11-24 ENCOUNTER — Other Ambulatory Visit: Payer: Self-pay

## 2018-11-24 DIAGNOSIS — Z20822 Contact with and (suspected) exposure to covid-19: Secondary | ICD-10-CM

## 2018-11-24 NOTE — Telephone Encounter (Signed)
Patient was called at the number provided. It rang numerous times.No voice mail . Please have the patient to verify what Pharmacy she needs to have the prescription sent to , and we will send it after I talk with Dr.Rehman.

## 2018-11-25 LAB — NOVEL CORONAVIRUS, NAA: SARS-CoV-2, NAA: NOT DETECTED

## 2018-12-03 ENCOUNTER — Ambulatory Visit (INDEPENDENT_AMBULATORY_CARE_PROVIDER_SITE_OTHER): Payer: PPO | Admitting: Physical Medicine and Rehabilitation

## 2018-12-03 ENCOUNTER — Encounter: Payer: Self-pay | Admitting: Physical Medicine and Rehabilitation

## 2018-12-03 ENCOUNTER — Other Ambulatory Visit: Payer: Self-pay

## 2018-12-03 ENCOUNTER — Ambulatory Visit: Payer: Self-pay

## 2018-12-03 DIAGNOSIS — M48061 Spinal stenosis, lumbar region without neurogenic claudication: Secondary | ICD-10-CM

## 2018-12-03 DIAGNOSIS — M47816 Spondylosis without myelopathy or radiculopathy, lumbar region: Secondary | ICD-10-CM

## 2018-12-03 DIAGNOSIS — M4125 Other idiopathic scoliosis, thoracolumbar region: Secondary | ICD-10-CM

## 2018-12-03 DIAGNOSIS — M25512 Pain in left shoulder: Secondary | ICD-10-CM | POA: Diagnosis not present

## 2018-12-03 DIAGNOSIS — G8929 Other chronic pain: Secondary | ICD-10-CM | POA: Diagnosis not present

## 2018-12-03 DIAGNOSIS — M797 Fibromyalgia: Secondary | ICD-10-CM

## 2018-12-03 NOTE — Progress Notes (Signed)
  Numeric Pain Rating Scale and Functional Assessment Average Pain 5   In the last MONTH (on 0-10 scale) has pain interfered with the following?  1. General activity like being  able to carry out your everyday physical activities such as walking, climbing stairs, carrying groceries, or moving a chair?  Rating(10)   +Driver, -BT, -Dye Allergies.  

## 2018-12-03 NOTE — Progress Notes (Signed)
Zoe [name] - 83 y.o. female MRN [id number]  Date of birth: [birth year]  Office Visit Note: Visit Date: 12/03/2018 PCP: Sinda Du, MD Referred by: Sinda Du, MD  Subjective: Chief Complaint  Patient presents with  . Left Shoulder - Pain, Injections  . Lower Back - Pain   HPI:  Zoe [name] is a 83 y.o. female who comes in today For evaluation and management of chronic worsening left shoulder pain as well as low back pain and history of either radicular pain or trochanteric bursitis.  In terms of her left shoulder she was evaluated by Benita Stabile, P.A.-C and Dr. Ninfa Linden back in February x-rays were obtained of the left glenohumeral joint showing some degenerative joint disease.  Ultimately we completed intra-articular injection sometime after that as part of the injection with her spine.  She reports that both the injection and the spine as well as the injection of her shoulder worked out greatly up until just recently.  In terms of her shoulder pain she has reduced range of motion she does have pain that refers down into the upper part of the humerus.  She has had no new trauma.  No radicular pain down into the hands no paresthesias.  She does get neck pain at times.  She does carry multiple drug intolerances and intolerances to different pain medications.  She has not had follow-up with Dr. Ninfa Linden concerning her shoulder.  She has no right shoulder complaints at least to the extent of her left.  Right shoulder can hurt her at times.  She is also complaining today of axial low back pain no radicular pain or trochanteric pain.  She has pain worse with standing and ambulating.  She feels like a lot of the pain is been in the legs may be related to her veins and she does have appointment with a vein specialist.  She has had no paresthesias or focal weakness or no new trauma there.  Review of Systems  Constitutional: Positive for malaise/fatigue. Negative for chills, fever and  weight loss.  HENT: Negative for hearing loss and sinus pain.   Eyes: Negative for blurred vision, double vision and photophobia.  Respiratory: Negative for cough and shortness of breath.   Cardiovascular: Negative for chest pain, palpitations and leg swelling.  Gastrointestinal: Negative for abdominal pain, nausea and vomiting.  Genitourinary: Negative for flank pain.  Musculoskeletal: Positive for back pain and joint pain. Negative for myalgias.  Skin: Negative for itching and rash.  Neurological: Negative for tingling, tremors, focal weakness and weakness.  Endo/Heme/Allergies: Negative.   Psychiatric/Behavioral: Negative for depression.  All other systems reviewed and are negative.  Otherwise per HPI.  Assessment & Plan: Visit Diagnoses:  1. Chronic left shoulder pain   2. Other idiopathic scoliosis, thoracolumbar region   3. Spondylosis without myelopathy or radiculopathy, lumbar region   4. Foraminal stenosis of lumbar region   5. Stenosis of lateral recess of lumbar spine   6. Fibromyalgia     Plan: Findings:  Chronic worsening left shoulder pain likely degenerative joint disease with significantly improved with prior intra-articular injection.  We will repeat the injection today.  She will continue to follow-up with Dr. Ninfa Linden from an orthopedic standpoint and dictate numbers of injections and we did go over that in detail.  Obviously she can have repeat injection at times he does have disorder maintain and watch numbers.  In terms of her low back she is actually doing fairly well.  She is  not having any radicular complaints although she complains of some greater trochanteric bursitis type pain.  She does have complicating features of scoliosis and degenerative changes of the spine along with fibromyalgia.  No high-grade central stenosis.  She will continue to follow with vein specialist.    Meds & Orders: No orders of the defined types were placed in this encounter.   Orders  Placed This Encounter  Procedures  . Large Joint Inj: L glenohumeral  . C-ARM NO Order    Follow-up: Return if symptoms worsen or fail to improve, for Jean Rosenthal, MD.   Procedures: Large Joint Inj: L glenohumeral on 12/03/2018 10:20 AM Indications: pain and diagnostic evaluation Details: 22 G 3.5 in needle, fluoroscopy-guided anteromedial approach  Arthrogram: No  Medications: 3 mL bupivacaine 0.5 %; 60 mg triamcinolone acetonide 40 MG/ML Outcome: tolerated well, no immediate complications  There was excellent flow of contrast producing a partial arthrogram of the glenohumeral joint. The patient did have MILD relief of symptoms during the anesthetic phase of the injection. Procedure, treatment alternatives, risks and benefits explained, specific risks discussed. Consent was given by the patient. Immediately prior to procedure a time out was called to verify the correct patient, procedure, equipment, support staff and site/side marked as required. Patient was prepped and draped in the usual sterile fashion.      No notes on file   Clinical History: MRI LUMBAR SPINE WITHOUT CONTRAST  TECHNIQUE: Multiplanar, multisequence MR imaging of the lumbar spine was performed. No intravenous contrast was administered.  COMPARISON:  CT of the abdomen and pelvis 10/04/2014  FINDINGS: Segmentation: 5 non rib-bearing lumbar type vertebral bodies are present. The lowest fully formed vertebral body is L5.  Alignment: 3-4 mm retrolisthesis is present at L2-3 and at L3-4. AP alignment is otherwise anatomic. Levoconvex curvature of the lumbar spine is centered at L3.  Vertebrae: Chronic endplate sclerotic changes are again noted at L1-2. Chronic endplate changes are more prominent on the right at L2-3 and L3-4. Schmorl's nodes are noted as well. An inferior endplate Schmorl's node is present on the right at L4-5. Mild endplate changes are noted posteriorly and on the left at  L5-S1.  Conus medullaris and cauda equina: Conus extends to the L1 level. Conus and cauda equina appear normal.  Paraspinal and other soft tissues: Limited imaging the abdomen is unremarkable. There is no significant adenopathy. No solid organ lesions are present.  Disc levels:  T12-L1: Leftward disc bulging is present without significant stenosis.  L1-2: There is chronic loss of disc height. Central canal is patent. Foraminal narrowing is worse on the left.  L2-3: A broad-based disc protrusion is asymmetric to the right. Facet hypertrophy is noted bilaterally. Moderate right and mild left subarticular stenosis is present. Left laminectomy is noted. Posterior canal is decompressed. Moderate foraminal narrowing is worse on the right.  L3-4: A broad-based disc protrusion is present. Facet hypertrophy is noted bilaterally. Moderate right subarticular and severe right foraminal stenosis is present. Mild left subarticular and moderate left foraminal stenosis is present.  L4-5: A broad-based disc protrusion is present. Moderate facet hypertrophy is noted bilaterally. Central canal is patent. Moderate foraminal narrowing is worse on the left.  L5-S1: A broad-based disc protrusion is present. Moderate facet hypertrophy is noted bilaterally. Mild left subarticular narrowing is present. Moderate to severe foraminal stenosis is worse on the left.  IMPRESSION: 1. Scoliosis and multilevel spondylosis of the lumbar spine as described. 2. Right-sided disease is most significant at L3-4 with  moderate right subarticular and severe right foraminal stenosis. 3. Left-sided disease is greatest at L5-S1 with moderate to severe left foraminal stenosis.   Electronically Signed   By: San Morelle M.D.   On: 03/24/2018 13:47     Objective:  VS:  HT:    WT:   BMI:     BP:   HR: bpm  TEMP: ( )  RESP:  Physical Exam Vitals signs and nursing note reviewed.   Constitutional:      General: She is not in acute distress.    Appearance: Normal appearance. She is well-developed. She is obese. She is not ill-appearing.  HENT:     Head: Normocephalic and atraumatic.  Eyes:     Conjunctiva/sclera: Conjunctivae normal.     Pupils: Pupils are equal, round, and reactive to light.  Cardiovascular:     Rate and Rhythm: Normal rate.     Pulses: Normal pulses.  Pulmonary:     Effort: Pulmonary effort is normal.  Musculoskeletal:     Right lower leg: No edema.     Left lower leg: No edema.     Comments: Patient sits with forward flexed cervical spine she has some limits of range of motion bilaterally left shoulder painful with abduction and crossarm movement.  No pain at the [employer] joint.  Pain with external rotation.  She has good strength in the upper extremities.  Lower back exam shows patient with pain on standing in extension.  Some pain over the greater trochanters good distal strength.  Skin:    General: Skin is warm and dry.     Findings: No erythema or rash.  Neurological:     General: No focal deficit present.     Mental Status: She is alert and oriented to person, place, and time.     Sensory: No sensory deficit.     Motor: No abnormal muscle tone.     Coordination: Coordination normal.     Gait: Gait normal.  Psychiatric:        Mood and Affect: Mood normal.        Behavior: Behavior normal.     Ortho Exam Imaging: No results found.

## 2018-12-06 ENCOUNTER — Encounter: Payer: Self-pay | Admitting: Physical Medicine and Rehabilitation

## 2018-12-06 DIAGNOSIS — M4125 Other idiopathic scoliosis, thoracolumbar region: Secondary | ICD-10-CM | POA: Insufficient documentation

## 2018-12-06 DIAGNOSIS — M47816 Spondylosis without myelopathy or radiculopathy, lumbar region: Secondary | ICD-10-CM | POA: Insufficient documentation

## 2018-12-06 DIAGNOSIS — M48061 Spinal stenosis, lumbar region without neurogenic claudication: Secondary | ICD-10-CM | POA: Insufficient documentation

## 2018-12-06 DIAGNOSIS — G8929 Other chronic pain: Secondary | ICD-10-CM | POA: Insufficient documentation

## 2018-12-06 MED ORDER — TRIAMCINOLONE ACETONIDE 40 MG/ML IJ SUSP
60.0000 mg | INTRAMUSCULAR | Status: AC | PRN
  Administered 2018-12-03: 10:00:00 60 mg via INTRA_ARTICULAR

## 2018-12-06 MED ORDER — BUPIVACAINE HCL 0.5 % IJ SOLN
3.0000 mL | INTRAMUSCULAR | Status: AC | PRN
  Administered 2018-12-03: 3 mL via INTRA_ARTICULAR

## 2018-12-07 ENCOUNTER — Encounter (INDEPENDENT_AMBULATORY_CARE_PROVIDER_SITE_OTHER): Payer: Self-pay | Admitting: Internal Medicine

## 2018-12-07 DIAGNOSIS — Z23 Encounter for immunization: Secondary | ICD-10-CM | POA: Diagnosis not present

## 2018-12-14 ENCOUNTER — Ambulatory Visit (INDEPENDENT_AMBULATORY_CARE_PROVIDER_SITE_OTHER): Payer: PPO | Admitting: Internal Medicine

## 2018-12-14 ENCOUNTER — Other Ambulatory Visit: Payer: Self-pay

## 2018-12-14 ENCOUNTER — Encounter (INDEPENDENT_AMBULATORY_CARE_PROVIDER_SITE_OTHER): Payer: Self-pay | Admitting: Internal Medicine

## 2018-12-14 VITALS — BP 133/84 | HR 69 | Temp 97.1°F | Ht 67.5 in | Wt 178.8 lb

## 2018-12-14 DIAGNOSIS — K5909 Other constipation: Secondary | ICD-10-CM | POA: Diagnosis not present

## 2018-12-14 DIAGNOSIS — Z20822 Contact with and (suspected) exposure to covid-19: Secondary | ICD-10-CM

## 2018-12-14 MED ORDER — MOTEGRITY 2 MG PO TABS: 2.0000 mg | ORAL_TABLET | Freq: Every day | ORAL | 1 refills | Status: DC

## 2018-12-14 NOTE — Progress Notes (Signed)
Presenting complaint;  Follow-up for chronic constipation.  Database and subjective:  Patient is 83 year old Caucasian female with a lifelong constipation and dependence on laxatives(she has been on as many as 10 senna tablets per day) and history of colonic adenomas who is here for scheduled visit.  She was last seen 6 months ago. She states she developed lower extremity edema and she was given furosemide by Dr. Luan Pulling.  She had echocardiogram which revealed diastolic dysfunction.  She was begun on other medicine.  Patient was subsequently seen by Dr. Bronson Ing who recommended stopping amlodipine which is only taking small dose.  He recommended diet high compression stockings and recommended vascular surgery consultation.  Patient has an appointment at the clinic next month. Patient states while she was on furosemide 40 mg daily she developed constipation and did not have a bowel movement for 10 days.  She was miserable.  She almost ended up in emergency room but finally able to have a bowel movement.  She says now she is taking very small dose of furosemide on as-needed basis.  She believes she is taking maybe 5 mg at a time.  She is having 3-4 bowel movements per week.  She has at least 1 good bowel movement.  She denies melena or rectal bleeding.  Her appetite is good.  Her weight has been stable since her last visit.  She is back on Amitiza since she ran out of [place].  She is wearing for Motegrity supply from the manufacturer.  While on Motegrity she has been trying to cut down her laxative pills.  She says heartburn is well controlled with omeprazole. She did not experience any side effects with Motegrity.  Current Medications: Outpatient Encounter Medications as of 12/14/2018  Medication Sig  . albuterol (PROVENTIL HFA;VENTOLIN HFA) 108 (90 BASE) MCG/ACT inhaler Inhale 2 puffs into the lungs every 6 (six) hours as needed for wheezing or shortness of breath.  . allopurinol (ZYLOPRIM) 300  MG tablet Take 300 mg by mouth daily.  Marland Kitchen ALPRAZolam (XANAX) 0.5 MG tablet Take 0.5 mg by mouth as needed for anxiety.   . AMITIZA 24 MCG capsule Take 24 mcg by mouth 2 (two) times daily.  Marland Kitchen aspirin 81 MG tablet Take 81 mg by mouth daily.  . Biotin 5000 MCG CAPS Take 5,000 mcg by mouth daily.  . digoxin (LANOXIN) 0.25 MG tablet Take 0.25 mg by mouth daily.   Marland Kitchen doxepin (SINEQUAN) 100 MG capsule Take 100 mg by mouth at bedtime.   . fexofenadine (ALLEGRA) 60 MG tablet Take 30 mg by mouth daily.   . furosemide (LASIX) 40 MG tablet Take 1 tablet by mouth daily.  Marland Kitchen gabapentin (NEURONTIN) 300 MG capsule Take 1 capsule (300 mg total) by mouth 3 (three) times daily. (Patient taking differently: Take 300 mg by mouth 2 (two) times daily. )  . ipratropium-albuterol (DUONEB) 0.5-2.5 (3) MG/3ML SOLN Inhale 3 mLs into the lungs every 4 (four) hours as needed.  . Multiple Vitamin (MULTIVITAMIN) tablet Take 1 tablet by mouth daily.  . naproxen (NAPROSYN) 500 MG tablet Take 500 mg by mouth daily.   Marland Kitchen omeprazole (PRILOSEC) 20 MG capsule Take 20 mg by mouth every morning.  . pravastatin (PRAVACHOL) 20 MG tablet Take 20 mg by mouth every other day.   . quiNINE (QUALAQUIN) 324 MG capsule Take 324 mg by mouth at bedtime.   . senna (SENOKOT) 8.6 MG tablet Take 5 tablets (43 mg total) by mouth daily. (Patient taking differently: Take 9  tablets by mouth at bedtime. )  . vitamin A 10000 UT capsule Take 1 capsule by mouth daily.  . [DISCONTINUED] brompheniramine-pseudoephedrine-DM 30-2-10 MG/5ML syrup Take 2.5 mLs by mouth 4 (four) times daily as needed.   No facility-administered encounter medications on file as of 12/14/2018.      Objective: Blood pressure 133/84, pulse 69, temperature (!) 97.1 F (36.2 C), temperature source Oral, height 5' 7.5" (1.715 m), weight 178 lb 12.8 oz (81.1 kg). Patient is alert and in no acute distress. Patient is wearing a mask. Conjunctiva is pink. Sclera is nonicteric Oropharyngeal  mucosa is normal. No neck masses or thyromegaly noted. Cardiac exam with regular rhythm normal S1 and S2. No murmur or gallop noted. Lungs are clear to auscultation. Abdomen abdomen is full.  She has right paramedian  scar.  On palpation is soft and nontender with organomegaly or masses. She has primarily nonpitting pretibial edema bilaterally.   Assessment:  #1.  Chronic constipation with dependent on stimulant laxatives.  No other therapy has worked except may be Motegrity/prucalopride.  Luckily she is not having any side effects.  She needs to keep trying to reduce senna dose to as low as possible.  #2.  History of colonic adenomas.  She is up-to-date on surveillance protocol.  Last colonoscopy was in January 2019.   Plan:  Patient will discontinue Amitiza when she goes back to taking Motegrity/prucalopride 2 mg by mouth daily. She should continue high-fiber diet and keep trying to decrease senna dose by 1 pill every week or so. Office visit in 6 months.

## 2018-12-14 NOTE — Patient Instructions (Signed)
Please notify if you experience any side-effects with motegrity

## 2018-12-15 LAB — NOVEL CORONAVIRUS, NAA: SARS-CoV-2, NAA: NOT DETECTED

## 2018-12-21 ENCOUNTER — Ambulatory Visit (INDEPENDENT_AMBULATORY_CARE_PROVIDER_SITE_OTHER): Payer: PPO | Admitting: Internal Medicine

## 2018-12-22 ENCOUNTER — Other Ambulatory Visit: Payer: Self-pay

## 2018-12-28 DIAGNOSIS — F419 Anxiety disorder, unspecified: Secondary | ICD-10-CM | POA: Diagnosis not present

## 2018-12-28 DIAGNOSIS — M791 Myalgia, unspecified site: Secondary | ICD-10-CM | POA: Diagnosis not present

## 2018-12-28 DIAGNOSIS — M545 Low back pain: Secondary | ICD-10-CM | POA: Diagnosis not present

## 2018-12-28 DIAGNOSIS — I5032 Chronic diastolic (congestive) heart failure: Secondary | ICD-10-CM | POA: Diagnosis not present

## 2019-01-13 ENCOUNTER — Other Ambulatory Visit (INDEPENDENT_AMBULATORY_CARE_PROVIDER_SITE_OTHER): Payer: Self-pay | Admitting: Orthopaedic Surgery

## 2019-01-13 DIAGNOSIS — H04123 Dry eye syndrome of bilateral lacrimal glands: Secondary | ICD-10-CM | POA: Diagnosis not present

## 2019-01-13 DIAGNOSIS — H532 Diplopia: Secondary | ICD-10-CM | POA: Diagnosis not present

## 2019-01-13 DIAGNOSIS — H40013 Open angle with borderline findings, low risk, bilateral: Secondary | ICD-10-CM | POA: Diagnosis not present

## 2019-01-13 DIAGNOSIS — Z961 Presence of intraocular lens: Secondary | ICD-10-CM | POA: Diagnosis not present

## 2019-01-14 ENCOUNTER — Encounter (HOSPITAL_COMMUNITY): Payer: PPO

## 2019-01-14 ENCOUNTER — Encounter: Payer: PPO | Admitting: Vascular Surgery

## 2019-02-10 ENCOUNTER — Other Ambulatory Visit: Payer: Self-pay

## 2019-02-10 ENCOUNTER — Telehealth (HOSPITAL_COMMUNITY): Payer: Self-pay

## 2019-02-10 DIAGNOSIS — I872 Venous insufficiency (chronic) (peripheral): Secondary | ICD-10-CM

## 2019-02-10 NOTE — Telephone Encounter (Signed)

## 2019-02-11 ENCOUNTER — Ambulatory Visit (HOSPITAL_COMMUNITY)
Admission: RE | Admit: 2019-02-11 | Discharge: 2019-02-11 | Disposition: A | Discharge status: Home / Self Care | Payer: PPO | Source: Ambulatory Visit | Attending: Vascular Surgery | Admitting: Vascular Surgery

## 2019-02-11 ENCOUNTER — Encounter: Payer: Self-pay | Admitting: Vascular Surgery

## 2019-02-11 ENCOUNTER — Other Ambulatory Visit: Payer: Self-pay

## 2019-02-11 ENCOUNTER — Ambulatory Visit (INDEPENDENT_AMBULATORY_CARE_PROVIDER_SITE_OTHER): Payer: PPO | Admitting: Vascular Surgery

## 2019-02-11 ENCOUNTER — Encounter (HOSPITAL_COMMUNITY): Payer: PPO

## 2019-02-11 ENCOUNTER — Encounter: Payer: PPO | Admitting: Vascular Surgery

## 2019-02-11 VITALS — BP 93/67 | HR 73 | Temp 97.9°F | Resp 20 | Ht 67.5 in | Wt 185.0 lb

## 2019-02-11 DIAGNOSIS — M7989 Other specified soft tissue disorders: Secondary | ICD-10-CM | POA: Diagnosis not present

## 2019-02-11 DIAGNOSIS — I872 Venous insufficiency (chronic) (peripheral): Secondary | ICD-10-CM | POA: Diagnosis not present

## 2019-02-11 NOTE — Progress Notes (Signed)
Patient ID: Zoe [name], female   DOB: May 15, 1935, 84 y.o.   MRN: [id number]  Reason for Consult: New Patient (Initial Visit)   Referred by Sinda Du, MD  Subjective:     HPI:  Zoe [name] is a 84 y.o. female history of bilateral hip replacements.  States that she has worsening pain in her bilateral lower extremities right greater than left.  She also has burning on the dorsum of both feet.  She does have some swelling mostly around her knees.  She has been started on Lasix.  She does not wear compression stockings.  She does elevate her legs when recumbent.  She has never had tissue loss or ulceration.  She is never had venous intervention or DVT in the past.  She does not take blood thinners.  She is a former smoker having quit very recently.  She has also recently gained 12 pounds which she also attributes to some of her lower extremity swelling.  Past Medical History:  Diagnosis Date  . Abdominal discomfort 11-02-12   suspected ?UTI- PCP MD placed on Cipro  . Anxiety   . Arthritis   . Asthma   . Cancer ([place])    skin CA  . Chronic constipation   . Constipation   . Cough   . Depression   . Dysrhythmia    tachycardia  . Environmental allergies   . Fibromyalgia   . GERD (gastroesophageal reflux disease)   . Hemorrhoids   . High cholesterol   . Hypercholesteremia   . Hypertension   . Nausea   . Pneumonia    7+ YRS  . Urgency of urination    Family History  Problem Relation Age of Onset  . Healthy Son   . Healthy Son   . Healthy Daughter   . Colon cancer Neg Hx    Past Surgical History:  Procedure Laterality Date  . ANAL RECTAL MANOMETRY N/A 11/28/2013   Procedure: ANO RECTAL MANOMETRY;  Surgeon: Leighton Ruff, MD;  Location: [facility];  Service: Endoscopy;  Laterality: N/A;  . APPENDECTOMY    . BACK SURGERY     X2   . CHOLECYSTECTOMY    . COLONOSCOPY  03/29/03  . COLONOSCOPY N/A 12/09/2013   Procedure: COLONOSCOPY;  Surgeon: Rogene Houston,  MD;  [facility];  Service: Endoscopy;  Laterality: N/A;  730  . COLONOSCOPY N/A 02/04/2017   Procedure: COLONOSCOPY;  Surgeon: Rogene Houston, MD;  [facility];  Service: Endoscopy;  Laterality: N/A;  930  . EYE SURGERY     bilateral cataract removal  . SKIN CANCER EXCISION     RIGHT NECK    . TONSILLECTOMY     T+A  . TOTAL HIP ARTHROPLASTY Right 09/14/2012   Procedure: RIGHT TOTAL HIP ARTHROPLASTY ANTERIOR APPROACH and RIGHT KNEE STEROID INJECTION;  Surgeon: Mcarthur Rossetti, MD;  Location: [facility];  Service: Orthopedics;  Laterality: Right;  . TOTAL HIP ARTHROPLASTY Left 11/05/2012   Procedure: LEFT TOTAL HIP ARTHROPLASTY ANTERIOR APPROACH;  Surgeon: Mcarthur Rossetti, MD;  [facility];  Service: Orthopedics;  Laterality: Left;  . UMBILICAL HERNIA REPAIR  04/26/03   JENKINS  . UPPER GASTROINTESTINAL ENDOSCOPY  08/03/2009   NUR  . UPPER GASTROINTESTINAL ENDOSCOPY  03/29/2003   EGD ED TCS    Short Social History:  Social History   Tobacco Use  . Smoking status: Former Smoker    Packs/day: 0.25    Years: 50.00  Pack years: 12.50    Types: Cigarettes    Quit date: 11/10/2018    Years since quitting: 0.2  . Smokeless tobacco: Never Used  . Tobacco comment: Hasn't smoked in 5-6 weeks  Substance Use Topics  . Alcohol use: Yes    Alcohol/week: 0.0 standard drinks    Comment: rare    Allergies  Allergen Reactions  . Codeine Other (See Comments)    Pains in abdominal area  . Escitalopram Itching  . Other     NOVACAINE     ITCHING Anti-depressants  . Oxycodone     Abdominal pain  . Paxil [Paroxetine Hcl] Itching  . Procaine Itching    Current Outpatient Medications  Medication Sig Dispense Refill  . albuterol (PROVENTIL HFA;VENTOLIN HFA) 108 (90 BASE) MCG/ACT inhaler Inhale 2 puffs into the lungs every 6 (six) hours as needed for wheezing or shortness of breath.    . allopurinol (ZYLOPRIM) 300 MG tablet Take 300 mg by mouth  daily.    Marland Kitchen ALPRAZolam (XANAX) 0.5 MG tablet Take 0.5 mg by mouth as needed for anxiety.     Marland Kitchen aspirin 81 MG tablet Take 81 mg by mouth daily.    . Biotin 5000 MCG CAPS Take 5,000 mcg by mouth daily.    . diclofenac Sodium (VOLTAREN) 1 % GEL APPLY 2-4 GRAMS TOPICALLY FOUR TIMES DAILY 200 g 3  . digoxin (LANOXIN) 0.25 MG tablet Take 0.25 mg by mouth daily.     Marland Kitchen doxepin (SINEQUAN) 100 MG capsule Take 100 mg by mouth at bedtime.     . fexofenadine (ALLEGRA) 60 MG tablet Take 30 mg by mouth daily.     . furosemide (LASIX) 40 MG tablet Take 1 tablet by mouth daily.    Marland Kitchen gabapentin (NEURONTIN) 300 MG capsule Take 1 capsule (300 mg total) by mouth 3 (three) times daily. (Patient taking differently: Take 300 mg by mouth 2 (two) times daily. ) 60 capsule 3  . ipratropium-albuterol (DUONEB) 0.5-2.5 (3) MG/3ML SOLN Inhale 3 mLs into the lungs every 4 (four) hours as needed.    . Multiple Vitamin (MULTIVITAMIN) tablet Take 1 tablet by mouth daily.    . naproxen (NAPROSYN) 500 MG tablet Take 500 mg by mouth daily.     Marland Kitchen omeprazole (PRILOSEC) 20 MG capsule Take 20 mg by mouth every morning.    . pravastatin (PRAVACHOL) 20 MG tablet Take 20 mg by mouth every other day.     . vitamin A 10000 UT capsule Take 1 capsule by mouth daily.    . quiNINE (QUALAQUIN) 324 MG capsule Take 324 mg by mouth at bedtime.     . senna (SENOKOT) 8.6 MG tablet Take 5 tablets (43 mg total) by mouth daily. (Patient not taking: Reported on 02/11/2019)     No current facility-administered medications for this visit.    Review of Systems  Constitutional:  Constitutional negative. HENT: HENT negative.  Eyes: Eyes negative.  Respiratory: Respiratory negative.  Cardiovascular: Positive for leg swelling.  GI: Gastrointestinal negative.  Musculoskeletal: Musculoskeletal negative.  Skin: Skin negative.  Neurological:       Burning dorsum of the feet Hematologic: Hematologic/lymphatic negative.  Psychiatric: Psychiatric negative.          Objective:  Objective  Vitals:   02/11/19 1429  BP: 93/67  Pulse: 73  Resp: 20  Temp: 97.9 F (36.6 C)  SpO2: 96%    Physical Exam HENT:     Head: Normocephalic.     Mouth/Throat:  Mouth: Mucous membranes are moist.  Eyes:     Pupils: Pupils are equal, round, and reactive to light.  Cardiovascular:     Rate and Rhythm: Normal rate.  Pulmonary:     Effort: Pulmonary effort is normal.  Abdominal:     General: Abdomen is flat.     Palpations: Abdomen is soft. There is no mass.  Musculoskeletal:        General: No swelling. Normal range of motion.     Cervical back: Normal range of motion.  Skin:    General: Skin is warm and dry.     Capillary Refill: Capillary refill takes less than 2 seconds.  Neurological:     General: No focal deficit present.     Mental Status: She is alert.  Psychiatric:        Mood and Affect: Mood normal.        Behavior: Behavior normal.        Thought Content: Thought content normal.        Judgment: Judgment normal.     Data: Independently interpreted her lower extremity venous reflux study.  On the right she has reflux at the saphenofemoral junction measuring 0.7 cm.  Greatest diameter after that 0.47 cm.  There is reflux in the proximal calf and mid calf.  On the left side there is reflux throughout the greater saphenous vein from the junction of the mid calf.  Greatest diameter 0.65 cm at the junction no areas over 0.3 cm distal to that.     Assessment/Plan:    84 year old female with bilateral lower extremity discomfort with associated swelling.  No significant reflux for intervention in her veins.  I have recommended compression as tolerated either thigh or knee-high is high grade she can tolerate and can get on given her history of arthritis in her fingers.  Of also recommended elevating her legs when recumbent.  She demonstrates good understanding can follow-up on an as-needed basis.     Waynetta Sandy  MD [facility]

## 2019-02-17 DIAGNOSIS — R2241 Localized swelling, mass and lump, right lower limb: Secondary | ICD-10-CM | POA: Diagnosis not present

## 2019-02-17 DIAGNOSIS — G9009 Other idiopathic peripheral autonomic neuropathy: Secondary | ICD-10-CM | POA: Diagnosis not present

## 2019-02-17 DIAGNOSIS — M130 Polyarthritis, unspecified: Secondary | ICD-10-CM | POA: Diagnosis not present

## 2019-02-17 DIAGNOSIS — I4891 Unspecified atrial fibrillation: Secondary | ICD-10-CM | POA: Diagnosis not present

## 2019-02-17 DIAGNOSIS — G4700 Insomnia, unspecified: Secondary | ICD-10-CM | POA: Diagnosis not present

## 2019-02-17 DIAGNOSIS — J309 Allergic rhinitis, unspecified: Secondary | ICD-10-CM | POA: Diagnosis not present

## 2019-02-17 DIAGNOSIS — M79602 Pain in left arm: Secondary | ICD-10-CM | POA: Diagnosis not present

## 2019-02-17 DIAGNOSIS — J45909 Unspecified asthma, uncomplicated: Secondary | ICD-10-CM | POA: Diagnosis not present

## 2019-02-17 DIAGNOSIS — F419 Anxiety disorder, unspecified: Secondary | ICD-10-CM | POA: Diagnosis not present

## 2019-02-17 DIAGNOSIS — K5900 Constipation, unspecified: Secondary | ICD-10-CM | POA: Diagnosis not present

## 2019-03-08 NOTE — Telephone Encounter (Signed)
This encounter was created in error - please disregard.

## 2019-03-09 ENCOUNTER — Ambulatory Visit (INDEPENDENT_AMBULATORY_CARE_PROVIDER_SITE_OTHER): Payer: PPO | Admitting: Orthopaedic Surgery

## 2019-03-09 ENCOUNTER — Other Ambulatory Visit: Payer: Self-pay

## 2019-03-09 DIAGNOSIS — G8929 Other chronic pain: Secondary | ICD-10-CM

## 2019-03-09 DIAGNOSIS — Z96612 Presence of left artificial shoulder joint: Secondary | ICD-10-CM

## 2019-03-09 DIAGNOSIS — M25512 Pain in left shoulder: Secondary | ICD-10-CM | POA: Diagnosis not present

## 2019-03-09 MED ORDER — HYDROCODONE-ACETAMINOPHEN 5-325 MG PO TABS: 1.0000 | ORAL_TABLET | Freq: Four times a day (QID) | ORAL | 0 refills | Status: DC | PRN

## 2019-03-09 MED ORDER — LIDOCAINE HCL 1 % IJ SOLN
3.0000 mL | INTRAMUSCULAR | Status: AC | PRN
  Administered 2019-03-09: 3 mL

## 2019-03-09 MED ORDER — METHYLPREDNISOLONE ACETATE 40 MG/ML IJ SUSP
40.0000 mg | INTRAMUSCULAR | Status: AC | PRN
  Administered 2019-03-09: 15:00:00 40 mg via INTRA_ARTICULAR

## 2019-03-09 NOTE — Progress Notes (Signed)
Office Visit Note   Patient: Zoe [name]           Date of Birth: 01-01-1936           MRN: [id number] Visit Date: 03/09/2019              Requested by: Sinda Du, MD No address on file PCP: Sinda Du, MD   Assessment & Plan: Visit Diagnoses:  1. Chronic left shoulder pain     Plan: To treat the acute nature of her pain in her left shoulder I did provide a steroid injection in the subacromial outlet.  At this point a MRI of her left shoulder is warranted to assess the rotator cuff and cartilage in general given the severity of her pain and the failure of conservative treatment for several years now.  She agrees with this treatment plan.  We will see her back after the MRI of her left shoulder.  Follow-Up Instructions: Return in about 2 weeks (around 03/23/2019).   Orders:  Orders Placed This Encounter  Procedures  . Large Joint Inj   No orders of the defined types were placed in this encounter.     Procedures: Large Joint Inj: L subacromial bursa on 03/09/2019 3:03 PM Indications: pain and diagnostic evaluation Details: 22 G 1.5 in needle  Arthrogram: No  Medications: 3 mL lidocaine 1 %; 40 mg methylPREDNISolone acetate 40 MG/ML Outcome: tolerated well, no immediate complications Procedure, treatment alternatives, risks and benefits explained, specific risks discussed. Consent was given by the patient. Immediately prior to procedure a time out was called to verify the correct patient, procedure, equipment, support staff and site/side marked as required. Patient was prepped and draped in the usual sterile fashion.       Clinical Data: No additional findings.   Subjective: Chief Complaint  Patient presents with  . Left Shoulder - Pain  The patient is continuing to experience severe debilitating pain with her left shoulder.  She did reports decreased overhead activities and weakness in the shoulder in general.  We followed her several years for this  and have injected it in the past.  At this point she is not sleeping well at night and is getting worse for her.  She says that the toothache type of pain and she feels like she is getting weaker as well.  HPI  Review of Systems She currently denies any headache, chest pain, shortness of breath, fever, chills, nausea, vomiting  Objective: Vital Signs: There were no vitals taken for this visit.  Physical Exam She is alert and orient x3 and in no acute distress Ortho Exam Examination of her left shoulder shows that is well located but very tender with positive Neer and Hawkins signs and a weak rotator cuff.  She has weakness with abduction and external rotation. Specialty Comments:  No specialty comments available.  Imaging: No results found.   PMFS History: Patient Active Problem List   Diagnosis Date Noted  . Stenosis of lateral recess of lumbar spine 12/06/2018  . Foraminal stenosis of lumbar region 12/06/2018  . Spondylosis without myelopathy or radiculopathy, lumbar region 12/06/2018  . Other idiopathic scoliosis, thoracolumbar region 12/06/2018  . Chronic left shoulder pain 12/06/2018  . History of colonic polyps 10/22/2016  . SBO (small bowel obstruction) ([place]) 03/02/2013  . Hyperlipidemia 12/08/2012  . Gout 12/08/2012  . Dysrhythmia 12/08/2012  . Depression 12/08/2012  . Asthma 12/08/2012  . Fibromyalgia 12/08/2012  . Constipation 12/08/2012  . Acute posthemorrhagic anemia  12/07/2012  . Essential hypertension, benign 12/07/2012  . Degenerative arthritis of hip, left 11/05/2012  . Arthritis pain of hip 09/14/2012  . Weakness of right leg 03/24/2011  . OA (osteoarthritis) 03/24/2011  . Stiffness of cervical spine 03/24/2011  . GERD (gastroesophageal reflux disease) 11/19/2010  . SPINAL STENOSIS 04/18/2009  . HIP PAIN 11/17/2007  . DEGEN LUMBAR/LUMBOSACRAL INTERVERTEBRAL DISC 11/17/2007  . ANSERINE BURSITIS, RIGHT 08/11/2007  . SCIATICA 06/22/2007  . KNEE PAIN  03/17/2007   Past Medical History:  Diagnosis Date  . Abdominal discomfort 11-02-12   suspected ?UTI- PCP MD placed on Cipro  . Anxiety   . Arthritis   . Asthma   . Cancer ([place])    skin CA  . Chronic constipation   . Constipation   . Cough   . Depression   . Dysrhythmia    tachycardia  . Environmental allergies   . Fibromyalgia   . GERD (gastroesophageal reflux disease)   . Hemorrhoids   . High cholesterol   . Hypercholesteremia   . Hypertension   . Nausea   . Pneumonia    7+ YRS  . Urgency of urination     Family History  Problem Relation Age of Onset  . Healthy Son   . Healthy Son   . Healthy Daughter   . Colon cancer Neg Hx     Past Surgical History:  Procedure Laterality Date  . ANAL RECTAL MANOMETRY N/A 11/28/2013   Procedure: ANO RECTAL MANOMETRY;  Surgeon: Leighton Ruff, MD;  [facility];  Service: Endoscopy;  Laterality: N/A;  . APPENDECTOMY    . BACK SURGERY     X2   . CHOLECYSTECTOMY    . COLONOSCOPY  03/29/03  . COLONOSCOPY N/A 12/09/2013   Procedure: COLONOSCOPY;  Surgeon: Rogene Houston, MD;  [facility];  Service: Endoscopy;  Laterality: N/A;  730  . COLONOSCOPY N/A 02/04/2017   Procedure: COLONOSCOPY;  Surgeon: Rogene Houston, MD;  [facility];  Service: Endoscopy;  Laterality: N/A;  930  . EYE SURGERY     bilateral cataract removal  . SKIN CANCER EXCISION     RIGHT NECK    . TONSILLECTOMY     T+A  . TOTAL HIP ARTHROPLASTY Right 09/14/2012   Procedure: RIGHT TOTAL HIP ARTHROPLASTY ANTERIOR APPROACH and RIGHT KNEE STEROID INJECTION;  Surgeon: Mcarthur Rossetti, MD;  Location: [place];  Service: Orthopedics;  Laterality: Right;  . TOTAL HIP ARTHROPLASTY Left 11/05/2012   Procedure: LEFT TOTAL HIP ARTHROPLASTY ANTERIOR APPROACH;  Surgeon: Mcarthur Rossetti, MD;  [facility];  Service: Orthopedics;  Laterality: Left;  . UMBILICAL HERNIA REPAIR  04/26/03   JENKINS  . UPPER GASTROINTESTINAL ENDOSCOPY   08/03/2009   NUR  . UPPER GASTROINTESTINAL ENDOSCOPY  03/29/2003   EGD ED TCS   Social History   Occupational History  . Not on file  Tobacco Use  . Smoking status: Former Smoker    Packs/day: 0.25    Years: 50.00    Pack years: 12.50    Types: Cigarettes    Quit date: 11/10/2018    Years since quitting: 0.3  . Smokeless tobacco: Never Used  . Tobacco comment: Hasn't smoked in 5-6 weeks  Substance and Sexual Activity  . Alcohol use: Yes    Alcohol/week: 0.0 standard drinks    Comment: rare  . Drug use: No  . Sexual activity: Not Currently    Birth control/protection: Post-menopausal

## 2019-03-16 ENCOUNTER — Ambulatory Visit: Payer: PPO

## 2019-03-26 ENCOUNTER — Other Ambulatory Visit: Payer: Self-pay

## 2019-03-26 ENCOUNTER — Ambulatory Visit (HOSPITAL_COMMUNITY)
Admission: RE | Admit: 2019-03-26 | Discharge: 2019-03-26 | Disposition: A | Discharge status: Home / Self Care | Payer: PPO | Source: Ambulatory Visit | Attending: Orthopaedic Surgery | Admitting: Orthopaedic Surgery

## 2019-03-26 DIAGNOSIS — M75112 Incomplete rotator cuff tear or rupture of left shoulder, not specified as traumatic: Secondary | ICD-10-CM | POA: Diagnosis not present

## 2019-03-26 DIAGNOSIS — Z96612 Presence of left artificial shoulder joint: Secondary | ICD-10-CM | POA: Diagnosis not present

## 2019-03-30 ENCOUNTER — Other Ambulatory Visit: Payer: Self-pay

## 2019-03-30 ENCOUNTER — Ambulatory Visit: Payer: Self-pay

## 2019-03-30 ENCOUNTER — Encounter: Payer: Self-pay | Admitting: Orthopaedic Surgery

## 2019-03-30 ENCOUNTER — Ambulatory Visit (INDEPENDENT_AMBULATORY_CARE_PROVIDER_SITE_OTHER): Payer: PPO | Admitting: Orthopaedic Surgery

## 2019-03-30 DIAGNOSIS — G8929 Other chronic pain: Secondary | ICD-10-CM | POA: Diagnosis not present

## 2019-03-30 DIAGNOSIS — M25512 Pain in left shoulder: Secondary | ICD-10-CM

## 2019-03-30 NOTE — Progress Notes (Signed)
Subjective: Patient is here for ultrasound-guided intra-articular left glenohumeral injection.    Objective:  Pain and decreased ROM.  Procedure: Ultrasound-guided left glenohumeral injection: After sterile prep with Betadine, injected 8 cc 1% lidocaine without epinephrine and 40 mg methylprednisolone using a 22-gauge spinal needle, passing the needle through approach into the glenohumeral joint.  Injectate seen filling joint capsule.

## 2019-03-30 NOTE — Progress Notes (Signed)
The patient is here today to go over an MRI of her left shoulder.  She has been having a lot of pain with overhead activities.  She is very active 84 years old.  I did provide a steroid injection subacromial space that helped somewhat but not as much as she would like.  On exam she still has significant pain with abduction past 90 degrees and with reaching overhead.  Her ex rotation is also painful.  Most of her pain seems to be deep in the shoulder joint itself.  There is a component of pain at the [facility] joint and the subacromial outlet with positive Neer and Hawkins signs.  The MRI is reviewed with her and it does show moderate glenohumeral arthritic changes and synovitis as well as a fluid collection in the joint itself.  There is severe tendinosis of the rotator cuff throughout the cuff itself.  There is a small partial-thickness tear but no muscle atrophy.  There is moderate AC joint arthritic changes as well.  I do feel that she would benefit from an intra-articular steroid injection under ultrasound with Dr. Junius Roads.  We will see if we can get this done for her.  Otherwise, I do feel another intervention could be an arthroscopic shoulder intervention which I explained in detail using a shoulder model.  I think we should try to stay conservative and the patient agrees with this as well and would like to try an intra-articular injection by Dr. Junius Roads in her left shoulder joint.  I can then see her back in a week or 2 to see how she is done with that injection.  All question concerns were answered and addressed.

## 2019-05-05 DIAGNOSIS — L821 Other seborrheic keratosis: Secondary | ICD-10-CM | POA: Diagnosis not present

## 2019-05-05 DIAGNOSIS — L812 Freckles: Secondary | ICD-10-CM | POA: Diagnosis not present

## 2019-05-05 DIAGNOSIS — L820 Inflamed seborrheic keratosis: Secondary | ICD-10-CM | POA: Diagnosis not present

## 2019-05-05 DIAGNOSIS — L570 Actinic keratosis: Secondary | ICD-10-CM | POA: Diagnosis not present

## 2019-05-09 DIAGNOSIS — R7301 Impaired fasting glucose: Secondary | ICD-10-CM | POA: Diagnosis not present

## 2019-05-09 DIAGNOSIS — Z0000 Encounter for general adult medical examination without abnormal findings: Secondary | ICD-10-CM | POA: Diagnosis not present

## 2019-05-09 DIAGNOSIS — E785 Hyperlipidemia, unspecified: Secondary | ICD-10-CM | POA: Diagnosis not present

## 2019-05-12 DIAGNOSIS — M79602 Pain in left arm: Secondary | ICD-10-CM | POA: Diagnosis not present

## 2019-05-12 DIAGNOSIS — J309 Allergic rhinitis, unspecified: Secondary | ICD-10-CM | POA: Diagnosis not present

## 2019-05-12 DIAGNOSIS — F419 Anxiety disorder, unspecified: Secondary | ICD-10-CM | POA: Diagnosis not present

## 2019-05-12 DIAGNOSIS — I4891 Unspecified atrial fibrillation: Secondary | ICD-10-CM | POA: Diagnosis not present

## 2019-05-12 DIAGNOSIS — G4700 Insomnia, unspecified: Secondary | ICD-10-CM | POA: Diagnosis not present

## 2019-05-12 DIAGNOSIS — K5900 Constipation, unspecified: Secondary | ICD-10-CM | POA: Diagnosis not present

## 2019-05-12 DIAGNOSIS — R2241 Localized swelling, mass and lump, right lower limb: Secondary | ICD-10-CM | POA: Diagnosis not present

## 2019-05-12 DIAGNOSIS — J45909 Unspecified asthma, uncomplicated: Secondary | ICD-10-CM | POA: Diagnosis not present

## 2019-05-12 DIAGNOSIS — M130 Polyarthritis, unspecified: Secondary | ICD-10-CM | POA: Diagnosis not present

## 2019-05-12 DIAGNOSIS — E781 Pure hyperglyceridemia: Secondary | ICD-10-CM | POA: Diagnosis not present

## 2019-05-12 DIAGNOSIS — G9009 Other idiopathic peripheral autonomic neuropathy: Secondary | ICD-10-CM | POA: Diagnosis not present

## 2019-05-12 DIAGNOSIS — R7301 Impaired fasting glucose: Secondary | ICD-10-CM | POA: Diagnosis not present

## 2019-05-19 DIAGNOSIS — J309 Allergic rhinitis, unspecified: Secondary | ICD-10-CM | POA: Diagnosis not present

## 2019-05-19 DIAGNOSIS — G4700 Insomnia, unspecified: Secondary | ICD-10-CM | POA: Diagnosis not present

## 2019-05-31 DIAGNOSIS — R3911 Hesitancy of micturition: Secondary | ICD-10-CM | POA: Diagnosis not present

## 2019-05-31 DIAGNOSIS — R3912 Poor urinary stream: Secondary | ICD-10-CM | POA: Diagnosis not present

## 2019-05-31 DIAGNOSIS — R34 Anuria and oliguria: Secondary | ICD-10-CM | POA: Diagnosis not present

## 2019-06-10 ENCOUNTER — Telehealth: Payer: Self-pay | Admitting: Cardiovascular Disease

## 2019-06-10 DIAGNOSIS — R351 Nocturia: Secondary | ICD-10-CM | POA: Diagnosis not present

## 2019-06-10 DIAGNOSIS — R109 Unspecified abdominal pain: Secondary | ICD-10-CM | POA: Diagnosis not present

## 2019-06-10 DIAGNOSIS — R350 Frequency of micturition: Secondary | ICD-10-CM | POA: Diagnosis not present

## 2019-06-10 NOTE — Telephone Encounter (Signed)
Patient does not want to schedule her follow up.

## 2019-06-21 ENCOUNTER — Other Ambulatory Visit: Payer: Self-pay

## 2019-06-21 ENCOUNTER — Ambulatory Visit (INDEPENDENT_AMBULATORY_CARE_PROVIDER_SITE_OTHER): Payer: PPO | Admitting: Internal Medicine

## 2019-06-21 ENCOUNTER — Encounter (INDEPENDENT_AMBULATORY_CARE_PROVIDER_SITE_OTHER): Payer: Self-pay | Admitting: Internal Medicine

## 2019-06-21 VITALS — BP 117/64 | HR 83 | Temp 97.0°F | Ht 67.5 in | Wt 181.2 lb

## 2019-06-21 DIAGNOSIS — K5900 Constipation, unspecified: Secondary | ICD-10-CM | POA: Diagnosis not present

## 2019-06-21 DIAGNOSIS — R1310 Dysphagia, unspecified: Secondary | ICD-10-CM | POA: Insufficient documentation

## 2019-06-21 DIAGNOSIS — R1319 Other dysphagia: Secondary | ICD-10-CM | POA: Insufficient documentation

## 2019-06-21 MED ORDER — SENNOSIDES 8.6 MG PO TABS: 5.0000 | ORAL_TABLET | Freq: Every day | ORAL | Status: DC

## 2019-06-21 MED ORDER — TRULANCE 3 MG PO TABS: 3.0000 mg | ORAL_TABLET | Freq: Every day | ORAL | 0 refills | Status: DC

## 2019-06-21 NOTE — Patient Instructions (Addendum)
Please drink more water and eat one to two apples daily. Please call office with progress report in 2 to 3 weeks. If Trulance works will send prescription to your pharmacy. Notify if swallowign difficulty worsens in which case will proceed with EGD and esophageal dilation

## 2019-06-21 NOTE — Progress Notes (Signed)
Presenting complaint;  Follow-up for chronic constipation.  Database and subjective:  Patient is 84 year old Caucasian female who has lifelong history of constipation who is dependent on stimulant laxatives who is here for scheduled visit.  She was last seen in November 2020. Patient says she is not doing well.  She is taking 10 tablets of senna every day.  It is not even working like it was.  In addition to Senokot she has to take mag citrate and she is lucky if she has 1 bowel movement per day and when she does this loose stool.  She denies abdominal pain nausea or vomiting.  She says she is only taking 500 mg of Naprosyn daily and not 1 g. Patient states she is not eating fiber rich foods.  She does not eat apple daily as recommended before.  She did not eat her breakfast or lunch today.  She just took her meds with few sips of water.  She did have 2 cups of coffee. She denies melena or rectal bleeding.  She is having multiple other symptoms which include vertigo back pain right leg pain. Patient tells me that her grandmother would give her laxative every night even when she was few years old.  She believes that this is what triggered her lifelong problem. Patient says heartburn is well controlled with PPI.  Her appetite is good and her weight has been stable.  She has dysphagia no more than once every 3 to 4 weeks.  She does not feel as bad enough to warrant further investigation. Patient has received both doses of Covid vaccine.  Current Medications: Outpatient Encounter Medications as of 06/21/2019  Medication Sig  . albuterol (PROVENTIL HFA;VENTOLIN HFA) 108 (90 BASE) MCG/ACT inhaler Inhale 2 puffs into the lungs every 6 (six) hours as needed for wheezing or shortness of breath.  . allopurinol (ZYLOPRIM) 300 MG tablet Take 300 mg by mouth daily.  Marland Kitchen ALPRAZolam (XANAX) 0.5 MG tablet Take 0.5 mg by mouth 2 (two) times daily as needed for anxiety.   Marland Kitchen aspirin 81 MG tablet Take 81 mg by mouth  daily.  . Biotin 5000 MCG CAPS Take 5,000 mcg by mouth daily.  . diclofenac Sodium (VOLTAREN) 1 % GEL APPLY 2-4 GRAMS TOPICALLY FOUR TIMES DAILY  . digoxin (LANOXIN) 0.25 MG tablet Take 0.25 mg by mouth daily.   Marland Kitchen doxepin (SINEQUAN) 100 MG capsule Take 100 mg by mouth at bedtime.   . fexofenadine (ALLEGRA) 60 MG tablet Take 60 mg by mouth daily.   Marland Kitchen gabapentin (NEURONTIN) 300 MG capsule Take 1 capsule (300 mg total) by mouth 3 (three) times daily. (Patient taking differently: Take 300 mg by mouth 2 (two) times daily. )  . ipratropium-albuterol (DUONEB) 0.5-2.5 (3) MG/3ML SOLN Inhale 3 mLs into the lungs every 4 (four) hours as needed.  . Multiple Vitamin (MULTIVITAMIN) tablet Take 1 tablet by mouth daily.  . naproxen (NAPROSYN) 500 MG tablet Take 1,000 mg by mouth daily.   Marland Kitchen omeprazole (PRILOSEC) 20 MG capsule Take 20 mg by mouth every morning.  . pravastatin (PRAVACHOL) 20 MG tablet Take 20 mg by mouth every other day.   . quiNINE (QUALAQUIN) 324 MG capsule Take 324 mg by mouth at bedtime.   . senna (SENOKOT) 8.6 MG tablet Take 5 tablets (43 mg total) by mouth daily. (Patient taking differently: Take 10 tablets by mouth daily. )  . vitamin A 10000 UT capsule Take 1 capsule by mouth daily.  . furosemide (LASIX) 40 MG  tablet Take 1 tablet by mouth daily.  . [DISCONTINUED] HYDROcodone-acetaminophen (NORCO/VICODIN) 5-325 MG tablet Take 1 tablet by mouth every 6 (six) hours as needed for moderate pain. (Patient not taking: Reported on 06/21/2019)   No facility-administered encounter medications on file as of 06/21/2019.     Objective: Blood pressure 117/64, pulse 83, temperature (!) 97 F (36.1 C), temperature source Temporal, height 5' 7.5" (1.715 m), weight 181 lb 3.2 oz (82.2 kg). Patient is alert and in no acute distress. She is wearing a facial mask. Conjunctiva is pink. Sclera is nonicteric Oropharyngeal mucosa is normal. No neck masses or thyromegaly noted. Cardiac exam with regular  rhythm normal S1 and S2. No murmur or gallop noted. Lungs are clear to auscultation. Abdomen is full.  Bowel sounds are normal.  On palpation abdomen is soft and nontender with organomegaly or masses. No LE edema or clubbing noted.   Assessment:  #1.  Chronic constipation.  She is taking too many stimulant laxative pills.  Prior attempts are getting her off this medication have failed.  She has been tried on Amitiza/lubiprostone, Linzess/linaclotide as well as Motegrity and polyethylene glycol.  These medications have not worked.  It is unclear whether or not she has tried Trulance in the past.  It be worth trying this medication. Patient also needs to make a serious effort to improve dietary habits and also drink a lot of water which might help. Patient was evaluated by colorectal surgeon couple of years ago and surgical intervention was not recommended.  #2.  History of colonic adenomas.  She is up-to-date on surveillance schedule.  Last colonoscopy was in January 2019.  #3.  Esophageal dysphagia.  Will monitor symptoms.  If dysphagia becomes more frequent patient will call office.  Plan:  Patient advised to drink more water every day and eat 1 or 2 apples daily. Medication list updated.  Patient advised to take Naprosyn with food or snack and not empty stomach.  Patient informed of the potential risks. Patient will start Trulance/plecanatide 3 mg by mouth every morning.  She was given 30-day supply. Patient advised to start cutting back on senna. Patient will call with progress report in 2 to 3 weeks and return for office visit in 6 months.

## 2019-06-22 DIAGNOSIS — H8111 Benign paroxysmal vertigo, right ear: Secondary | ICD-10-CM | POA: Diagnosis not present

## 2019-06-22 DIAGNOSIS — H6121 Impacted cerumen, right ear: Secondary | ICD-10-CM | POA: Diagnosis not present

## 2019-06-22 DIAGNOSIS — H903 Sensorineural hearing loss, bilateral: Secondary | ICD-10-CM | POA: Diagnosis not present

## 2019-06-22 DIAGNOSIS — R42 Dizziness and giddiness: Secondary | ICD-10-CM | POA: Diagnosis not present

## 2019-06-22 DIAGNOSIS — R109 Unspecified abdominal pain: Secondary | ICD-10-CM | POA: Diagnosis not present

## 2019-06-29 ENCOUNTER — Ambulatory Visit (INDEPENDENT_AMBULATORY_CARE_PROVIDER_SITE_OTHER): Payer: PPO | Admitting: Physician Assistant

## 2019-06-29 ENCOUNTER — Other Ambulatory Visit: Payer: Self-pay

## 2019-06-29 ENCOUNTER — Encounter: Payer: Self-pay | Admitting: Physician Assistant

## 2019-06-29 ENCOUNTER — Ambulatory Visit (INDEPENDENT_AMBULATORY_CARE_PROVIDER_SITE_OTHER): Payer: PPO

## 2019-06-29 DIAGNOSIS — M545 Low back pain, unspecified: Secondary | ICD-10-CM

## 2019-06-29 MED ORDER — METHYLPREDNISOLONE 4 MG PO TABS: ORAL_TABLET | ORAL | 0 refills | Status: DC

## 2019-06-29 NOTE — Progress Notes (Addendum)
Office Visit Note   Patient: Zoe [name]           Date of Birth: 15-Apr-1935           MRN: [id number] Visit Date: 06/29/2019              Requested by: Celene Squibb, MD [place],  [place] [id number] PCP: Celene Squibb, MD   Assessment & Plan: Visit Diagnoses:  1. Low back pain, unspecified back pain laterality, unspecified chronicity, unspecified whether sciatica present     Plan: Due to patient's pending urinalysis and CVA tenderness on exam today would not recommend any type of steroid.  Explained to her that she definitely has enough changes within her back to be causing some of the back problems but there is definite suspicion that this could be some type of urinary tract infection.  She does have clean urinalysis then we can definitely call a Medrol Dosepak and for her.  I explained to her husband in patient my reasoning.  Follow-Up Instructions: Return if symptoms worsen or fail to improve.   Orders:  Orders Placed This Encounter  Procedures  . XR Lumbar Spine 2-3 Views   Meds ordered this encounter  Medications  . DISCONTD: methylPREDNISolone (MEDROL) 4 MG tablet    Sig: Take as directed    Dispense:  21 tablet    Refill:  0      Procedures: No procedures performed   Clinical Data: No additional findings.   Subjective: Chief Complaint  Patient presents with  . Lower Back - Pain    States a knot like area came up on back.    HPI  Patient comes in today with low back pain for the last week came on all of a sudden.  No known injury.  She states she has had some swelling of her back and that there is a hard round nodule in or around her spine.  She states it feels like she has an open wound throbbing on her left side in the lower lumbar region.  Her husband's been massaging and feels that the swelling in her back is decreased.  She had no fevers chills.  She denies any change in bowel or bladder function she does have some constipation.   However she is being seen by urology and has had 2 specimens with questionable contaminant versus infection.  She denies any dysuria.  She denies any saddle anesthesia.  Pain does not radiate down either leg.  She does have some chronic numbness and tingling in both feet though.  Pain is worse with sitting she feels best when lying down.  No waking pain.  No saddle anesthesia like symptoms.  Prior MRI February 2020 showed right-sided bill 3 4 moderate subarticular stenosis and severe right foraminal stenosis.  L4-L5 5 moderate facet changes bilaterally and moderate foraminal stenosis on the left at L4-5.  Left-sided moderate to severe stenosis at L5-S1.   Review of Systems See HPI otherwise negative or noncontributory.  Objective: Vital Signs: There were no vitals taken for this visit.  Physical Exam Constitutional:      Appearance: She is not ill-appearing or diaphoretic.  Pulmonary:     Effort: Pulmonary effort is normal.  Neurological:     Mental Status: She is oriented to person, place, and time.  Psychiatric:        Behavior: Behavior normal.     Ortho Exam Lower extremities 5 out of 5  strength throughout against resistance.  Negative straight leg raise bilaterally.  Deep tendon reflexes are 2+ at the knees and ankle and equal symmetric.  She has a lipoma over the mid lumbar spinal column.  There is no erythema no edema no rashes over the lumbar spine.  She has CVA tenderness on the left negative on the right. Specialty Comments:  No specialty comments available.  Imaging: Lumbar spine 2 views: No acute fractures.  Disc space narrowing at L1-L2 3.  Facet arthritic changes lower lumbar spine.  Arthrosclerosis of the aorta is noted.  No spondylolisthesis.   PMFS History: Patient Active Problem List   Diagnosis Date Noted  . Esophageal dysphagia 06/21/2019  . Stenosis of lateral recess of lumbar spine 12/06/2018  . Foraminal stenosis of lumbar region 12/06/2018  . Spondylosis  without myelopathy or radiculopathy, lumbar region 12/06/2018  . Other idiopathic scoliosis, thoracolumbar region 12/06/2018  . Chronic left shoulder pain 12/06/2018  . History of colonic polyps 10/22/2016  . SBO (small bowel obstruction) ([place]) 03/02/2013  . Hyperlipidemia 12/08/2012  . Gout 12/08/2012  . Dysrhythmia 12/08/2012  . Depression 12/08/2012  . Asthma 12/08/2012  . Fibromyalgia 12/08/2012  . Constipation 12/08/2012  . Acute posthemorrhagic anemia 12/07/2012  . Essential hypertension, benign 12/07/2012  . Degenerative arthritis of hip, left 11/05/2012  . Arthritis pain of hip 09/14/2012  . Weakness of right leg 03/24/2011  . OA (osteoarthritis) 03/24/2011  . Stiffness of cervical spine 03/24/2011  . GERD (gastroesophageal reflux disease) 11/19/2010  . SPINAL STENOSIS 04/18/2009  . HIP PAIN 11/17/2007  . DEGEN LUMBAR/LUMBOSACRAL INTERVERTEBRAL DISC 11/17/2007  . ANSERINE BURSITIS, RIGHT 08/11/2007  . SCIATICA 06/22/2007  . KNEE PAIN 03/17/2007   Past Medical History:  Diagnosis Date  . Abdominal discomfort 11-02-12   suspected ?UTI- PCP MD placed on Cipro  . Anxiety   . Arthritis   . Asthma   . Cancer ([place])    skin CA  . Chronic constipation   . Constipation   . Cough   . Depression   . Dysrhythmia    tachycardia  . Environmental allergies   . Fibromyalgia   . GERD (gastroesophageal reflux disease)   . Hemorrhoids   . High cholesterol   . Hypercholesteremia   . Hypertension   . Nausea   . Pneumonia    7+ YRS  . Urgency of urination     Family History  Problem Relation Age of Onset  . Healthy Son   . Healthy Son   . Healthy Daughter   . Colon cancer Neg Hx     Past Surgical History:  Procedure Laterality Date  . ANAL RECTAL MANOMETRY N/A 11/28/2013   Procedure: ANO RECTAL MANOMETRY;  Surgeon: Leighton Ruff, MD;  Location: [facility];  Service: Endoscopy;  Laterality: N/A;  . APPENDECTOMY    . BACK SURGERY     X2   . CHOLECYSTECTOMY    .  COLONOSCOPY  03/29/03  . COLONOSCOPY N/A 12/09/2013   Procedure: COLONOSCOPY;  Surgeon: Rogene Houston, MD;  Location: [facility];  Service: Endoscopy;  Laterality: N/A;  730  . COLONOSCOPY N/A 02/04/2017   Procedure: COLONOSCOPY;  Surgeon: Rogene Houston, MD;  Location: [facility];  Service: Endoscopy;  Laterality: N/A;  930  . EYE SURGERY     bilateral cataract removal  . SKIN CANCER EXCISION     RIGHT NECK    . TONSILLECTOMY     T+A  . TOTAL HIP ARTHROPLASTY Right 09/14/2012  Procedure: RIGHT TOTAL HIP ARTHROPLASTY ANTERIOR APPROACH and RIGHT KNEE STEROID INJECTION;  Surgeon: Mcarthur Rossetti, MD;  Location: [facility];  Service: Orthopedics;  Laterality: Right;  . TOTAL HIP ARTHROPLASTY Left 11/05/2012   Procedure: LEFT TOTAL HIP ARTHROPLASTY ANTERIOR APPROACH;  Surgeon: Mcarthur Rossetti, MD;  Location: [facility];  Service: Orthopedics;  Laterality: Left;  . UMBILICAL HERNIA REPAIR  04/26/03   JENKINS  . UPPER GASTROINTESTINAL ENDOSCOPY  08/03/2009   NUR  . UPPER GASTROINTESTINAL ENDOSCOPY  03/29/2003   [facility] ED TCS   Social History   Occupational History  . Not on file  Tobacco Use  . Smoking status: Former Smoker    Packs/day: 0.25    Years: 50.00    Pack years: 12.50    Types: Cigarettes    Quit date: 11/10/2018    Years since quitting: 0.6  . Smokeless tobacco: Never Used  . Tobacco comment: Hasn't smoked in 5-6 weeks  Substance and Sexual Activity  . Alcohol use: Yes    Alcohol/week: 0.0 standard drinks    Comment: rare  . Drug use: No  . Sexual activity: Not Currently    Birth control/protection: Post-menopausal

## 2019-06-30 DIAGNOSIS — N3000 Acute cystitis without hematuria: Secondary | ICD-10-CM | POA: Diagnosis not present

## 2019-06-30 DIAGNOSIS — R351 Nocturia: Secondary | ICD-10-CM | POA: Diagnosis not present

## 2019-07-01 ENCOUNTER — Telehealth: Payer: Self-pay | Admitting: Orthopaedic Surgery

## 2019-07-01 NOTE — Telephone Encounter (Signed)
Pt called wanting to let us know her urine test came back and she's clear to be prescribed the pretnizone.   [phone]

## 2019-07-02 ENCOUNTER — Other Ambulatory Visit: Payer: Self-pay | Admitting: Specialist

## 2019-07-02 MED ORDER — METHYLPREDNISOLONE 4 MG PO TBPK: ORAL_TABLET | ORAL | 0 refills | Status: DC

## 2019-07-04 ENCOUNTER — Telehealth (INDEPENDENT_AMBULATORY_CARE_PROVIDER_SITE_OTHER): Payer: Self-pay | Admitting: Internal Medicine

## 2019-07-04 ENCOUNTER — Other Ambulatory Visit: Payer: Self-pay | Admitting: Physician Assistant

## 2019-07-04 MED ORDER — METHYLPREDNISOLONE 4 MG PO TABS: ORAL_TABLET | ORAL | 0 refills | Status: DC

## 2019-07-04 NOTE — Telephone Encounter (Signed)
Patient was seen 06/01/2019 in our office. She was given samples of Trulance.. This is the medication that the patient states - is not working and will need to try something else. Please advise.

## 2019-07-04 NOTE — Telephone Encounter (Signed)
Patient left voice mail message stating the new medication is not working - needs to try something else - ph# [phone]

## 2019-07-04 NOTE — Telephone Encounter (Signed)
Sent in

## 2019-07-05 ENCOUNTER — Telehealth: Payer: Self-pay | Admitting: Orthopaedic Surgery

## 2019-07-05 NOTE — Telephone Encounter (Signed)
Patient called. Says she is in a lot of pain. Nothing is helping. Would like someone to call her. Her call back number is [phone]

## 2019-07-05 NOTE — Telephone Encounter (Signed)
Called left message we can send in pain meds and get an ESI

## 2019-07-06 ENCOUNTER — Telehealth: Payer: Self-pay | Admitting: Orthopaedic Surgery

## 2019-07-06 ENCOUNTER — Other Ambulatory Visit: Payer: Self-pay | Admitting: Radiology

## 2019-07-06 DIAGNOSIS — M545 Low back pain, unspecified: Secondary | ICD-10-CM

## 2019-07-06 NOTE — Telephone Encounter (Signed)
Patient called. She would like something else for pain and a MRI ordered. Her call back number is [phone]

## 2019-07-11 ENCOUNTER — Telehealth (INDEPENDENT_AMBULATORY_CARE_PROVIDER_SITE_OTHER): Payer: Self-pay | Admitting: Internal Medicine

## 2019-07-11 NOTE — Telephone Encounter (Signed)
Trulance/plecanatide 3 mg by mouth every morning--looks like Dr Laural Golden gave her a 30 day supply, did this help any? SHe has tried most regimens including miralax, linzess and amitiza. She can use miralax in addition to trulance if needed. Also make sure she is eating fruit (apple) daily per Dr Otelia Limes recommendations. Thanks.

## 2019-07-11 NOTE — Telephone Encounter (Signed)
This has been sent to Laurine Blazer to review and make recommendations.

## 2019-07-11 NOTE — Telephone Encounter (Signed)
Thayer Headings - this patient has chronic Constipation. We have given her about every thing that we can. None of it helps. Review her last recent office note, she has a regiment that she uses.  Thank you for your help.

## 2019-07-11 NOTE — Telephone Encounter (Signed)
This did not help the patient either. To be addessed with Dr.Rehman.

## 2019-07-11 NOTE — Telephone Encounter (Signed)
Patient left voice mail message stating when she was last seen Dr Laural Golden put her on a laxative type medication - states she has done everything Dr Laural Golden requested her do and nothing has worked - please advise - ph# [phone]

## 2019-07-14 DIAGNOSIS — H04123 Dry eye syndrome of bilateral lacrimal glands: Secondary | ICD-10-CM | POA: Diagnosis not present

## 2019-07-14 DIAGNOSIS — H353131 Nonexudative age-related macular degeneration, bilateral, early dry stage: Secondary | ICD-10-CM | POA: Diagnosis not present

## 2019-07-14 DIAGNOSIS — H524 Presbyopia: Secondary | ICD-10-CM | POA: Diagnosis not present

## 2019-07-14 DIAGNOSIS — H40013 Open angle with borderline findings, low risk, bilateral: Secondary | ICD-10-CM | POA: Diagnosis not present

## 2019-07-14 NOTE — Telephone Encounter (Signed)
Dr.Rehman - what other recommendation may be made for patient?

## 2019-07-15 ENCOUNTER — Other Ambulatory Visit: Payer: Self-pay

## 2019-07-15 ENCOUNTER — Ambulatory Visit
Admission: RE | Admit: 2019-07-15 | Discharge: 2019-07-15 | Disposition: A | Discharge status: Home / Self Care | Payer: PPO | Source: Ambulatory Visit | Attending: Physician Assistant | Admitting: Physician Assistant

## 2019-07-15 DIAGNOSIS — M545 Low back pain, unspecified: Secondary | ICD-10-CM

## 2019-07-15 DIAGNOSIS — M48061 Spinal stenosis, lumbar region without neurogenic claudication: Secondary | ICD-10-CM | POA: Diagnosis not present

## 2019-07-18 ENCOUNTER — Encounter (INDEPENDENT_AMBULATORY_CARE_PROVIDER_SITE_OTHER): Payer: Self-pay | Admitting: *Deleted

## 2019-07-18 ENCOUNTER — Other Ambulatory Visit: Payer: Self-pay | Admitting: Radiology

## 2019-07-18 ENCOUNTER — Telehealth: Payer: Self-pay | Admitting: Physician Assistant

## 2019-07-18 DIAGNOSIS — S22000A Wedge compression fracture of unspecified thoracic vertebra, initial encounter for closed fracture: Secondary | ICD-10-CM

## 2019-07-18 NOTE — Telephone Encounter (Signed)
No other recommendations at this time She can use suppository or Fleet Enema every third day as needed

## 2019-07-18 NOTE — Telephone Encounter (Signed)
Pt called wanting to know if she could have the MRI done as well as get an injection due to her husband having surgery and her availability being shortened. Pt would like a CB with this answer.   [phone]

## 2019-07-18 NOTE — Telephone Encounter (Signed)
Needs MRI t spine to evaluate compression fracture at T11 with and without contrast

## 2019-07-18 NOTE — Telephone Encounter (Signed)
Patient was sent a patient Advice Message with Dr.Rehman's response to her question about medication given not helping with her Constipation.

## 2019-07-18 NOTE — Telephone Encounter (Signed)
Please advise.  Looks like patient had MRI on 6/18. I did not see order for injection.

## 2019-07-19 NOTE — Telephone Encounter (Signed)
Order placed yesterday.

## 2019-07-21 ENCOUNTER — Other Ambulatory Visit: Payer: Self-pay | Admitting: Radiology

## 2019-07-21 ENCOUNTER — Other Ambulatory Visit: Payer: PPO

## 2019-07-21 ENCOUNTER — Encounter: Payer: Self-pay | Admitting: Orthopaedic Surgery

## 2019-07-21 DIAGNOSIS — M545 Low back pain, unspecified: Secondary | ICD-10-CM

## 2019-07-22 ENCOUNTER — Telehealth: Payer: Self-pay

## 2019-07-22 ENCOUNTER — Other Ambulatory Visit: Payer: Self-pay | Admitting: Orthopaedic Surgery

## 2019-07-22 ENCOUNTER — Ambulatory Visit
Admission: RE | Admit: 2019-07-22 | Discharge: 2019-07-22 | Disposition: A | Discharge status: Home / Self Care | Payer: PPO | Source: Ambulatory Visit | Attending: Physician Assistant | Admitting: Physician Assistant

## 2019-07-22 DIAGNOSIS — M545 Low back pain, unspecified: Secondary | ICD-10-CM

## 2019-07-22 DIAGNOSIS — M8448XA Pathological fracture, other site, initial encounter for fracture: Secondary | ICD-10-CM | POA: Diagnosis not present

## 2019-07-22 DIAGNOSIS — S22000A Wedge compression fracture of unspecified thoracic vertebra, initial encounter for closed fracture: Secondary | ICD-10-CM

## 2019-07-22 MED ORDER — GADOBENATE DIMEGLUMINE 529 MG/ML IV SOLN
17.0000 mL | Freq: Once | INTRAVENOUS | Status: AC | PRN
  Administered 2019-07-22: 17 mL via INTRAVENOUS

## 2019-07-22 NOTE — Telephone Encounter (Signed)
Patient would like to worked in one day next week to go over her MRI results.  Patient is trying to get things done before her husband has surgery 08/08/2019.  CB# [phone].  Please advise.  Thank you.

## 2019-07-22 NOTE — Telephone Encounter (Signed)
Made appt Wed. 6/30 3pm

## 2019-07-25 ENCOUNTER — Other Ambulatory Visit: Payer: PPO

## 2019-07-27 ENCOUNTER — Ambulatory Visit: Payer: PPO | Admitting: Orthopaedic Surgery

## 2019-07-27 DIAGNOSIS — R42 Dizziness and giddiness: Secondary | ICD-10-CM | POA: Diagnosis not present

## 2019-07-27 DIAGNOSIS — J338 Other polyp of sinus: Secondary | ICD-10-CM | POA: Diagnosis not present

## 2019-07-27 DIAGNOSIS — J310 Chronic rhinitis: Secondary | ICD-10-CM | POA: Diagnosis not present

## 2019-07-27 DIAGNOSIS — J342 Deviated nasal septum: Secondary | ICD-10-CM | POA: Diagnosis not present

## 2019-07-27 DIAGNOSIS — J343 Hypertrophy of nasal turbinates: Secondary | ICD-10-CM | POA: Diagnosis not present

## 2019-07-27 DIAGNOSIS — H8111 Benign paroxysmal vertigo, right ear: Secondary | ICD-10-CM | POA: Diagnosis not present

## 2019-07-28 ENCOUNTER — Encounter: Payer: Self-pay | Admitting: Orthopaedic Surgery

## 2019-07-28 ENCOUNTER — Other Ambulatory Visit: Payer: Self-pay

## 2019-07-28 ENCOUNTER — Ambulatory Visit: Payer: PPO | Admitting: Orthopaedic Surgery

## 2019-07-28 DIAGNOSIS — S22000A Wedge compression fracture of unspecified thoracic vertebra, initial encounter for closed fracture: Secondary | ICD-10-CM

## 2019-07-28 NOTE — Progress Notes (Signed)
The patient comes in to go over MRIs of her thoracic and lumbar spine.  She is having severe low back pain and is in the thoracic area of the lower thoracic and upper lumbar area but she is also having radicular symptoms down the left leg and the right leg.  This is causing her to lean forward because the pain she is having that is worst in the lower thoracic-upper lumbar area and into the ribs.  The MRI does confirm of the thoracic spine that there is a acute T11 compression fracture.  The vertebral height loss is only about 20%.  The MRI of the lumbar spine does show multifactorial foraminal stenosis at L3-L4 bilaterally as well as L5-S1 there is more to the left than the right.  Both of these can be accounting for her radicular symptoms going down her legs.  Most of her issues seem to be related to the severe pain she has from the compression fracture.  At this point I would like to send her to interventional radiology for them to consider a vertebroplasty versus kyphoplasty of T11.  I am uncertain as to whether they would consider any other epidural types of injections given her radicular symptoms.  I do feel the T11 issue is more pressing.  We will work on making that referral.  All questions and concerns were answered and addressed.

## 2019-08-02 ENCOUNTER — Other Ambulatory Visit: Payer: PPO

## 2019-08-02 ENCOUNTER — Telehealth: Payer: Self-pay | Admitting: Orthopaedic Surgery

## 2019-08-02 ENCOUNTER — Other Ambulatory Visit: Payer: Self-pay | Admitting: Orthopaedic Surgery

## 2019-08-02 DIAGNOSIS — S22000A Wedge compression fracture of unspecified thoracic vertebra, initial encounter for closed fracture: Secondary | ICD-10-CM

## 2019-08-02 NOTE — Telephone Encounter (Signed)
Patient called.   She was calling to let us know that paperwork was sent in from [facility] on her behalf. It will need to filled out.   Call back: [phone]

## 2019-08-02 NOTE — Telephone Encounter (Signed)
Patient aware it is [facility]

## 2019-08-02 NOTE — Telephone Encounter (Signed)
Pt would like a CB with the name and number of the place where Dr.Blackman was referring her.   [phone]

## 2019-08-03 ENCOUNTER — Encounter: Payer: Self-pay | Admitting: *Deleted

## 2019-08-03 ENCOUNTER — Ambulatory Visit
Admission: RE | Admit: 2019-08-03 | Discharge: 2019-08-03 | Disposition: A | Discharge status: Home / Self Care | Payer: PPO | Source: Ambulatory Visit | Attending: Orthopaedic Surgery | Admitting: Orthopaedic Surgery

## 2019-08-03 DIAGNOSIS — S22000A Wedge compression fracture of unspecified thoracic vertebra, initial encounter for closed fracture: Secondary | ICD-10-CM

## 2019-08-03 DIAGNOSIS — S22080A Wedge compression fracture of T11-T12 vertebra, initial encounter for closed fracture: Secondary | ICD-10-CM | POA: Diagnosis not present

## 2019-08-03 HISTORY — PX: IR RADIOLOGIST EVAL & MGMT: IMG5224
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
## 2019-08-05 ENCOUNTER — Other Ambulatory Visit: Payer: PPO

## 2019-08-15 ENCOUNTER — Telehealth: Payer: Self-pay | Admitting: Orthopaedic Surgery

## 2019-08-15 NOTE — Telephone Encounter (Signed)
Patient called. She missed a phone call. Would like a call back.

## 2019-08-15 NOTE — Telephone Encounter (Signed)
I called patient, no answer. Left voicemail for return call.

## 2019-08-15 NOTE — Telephone Encounter (Signed)
Pt would like a CB in regards to miscommunication between [place] and [facility].   [phone]

## 2019-08-15 NOTE — Telephone Encounter (Signed)
Left voicemail for call back with more information about miscommunication.

## 2019-08-22 ENCOUNTER — Other Ambulatory Visit: Payer: Self-pay | Admitting: Radiology
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
## 2019-08-23 ENCOUNTER — Ambulatory Visit (HOSPITAL_COMMUNITY)
Admission: RE | Admit: 2019-08-23 | Discharge: 2019-08-23 | Disposition: A | Discharge status: Home / Self Care | Payer: PPO | Source: Ambulatory Visit | Attending: Orthopaedic Surgery | Admitting: Orthopaedic Surgery

## 2019-08-23 ENCOUNTER — Other Ambulatory Visit: Payer: Self-pay | Admitting: Orthopaedic Surgery

## 2019-08-23 ENCOUNTER — Other Ambulatory Visit: Payer: Self-pay

## 2019-08-23 DIAGNOSIS — F329 Major depressive disorder, single episode, unspecified: Secondary | ICD-10-CM | POA: Diagnosis not present

## 2019-08-23 DIAGNOSIS — I10 Essential (primary) hypertension: Secondary | ICD-10-CM | POA: Diagnosis not present

## 2019-08-23 DIAGNOSIS — K219 Gastro-esophageal reflux disease without esophagitis: Secondary | ICD-10-CM | POA: Diagnosis not present

## 2019-08-23 DIAGNOSIS — E7800 Pure hypercholesterolemia, unspecified: Secondary | ICD-10-CM | POA: Diagnosis not present

## 2019-08-23 DIAGNOSIS — X58XXXS Exposure to other specified factors, sequela: Secondary | ICD-10-CM | POA: Diagnosis not present

## 2019-08-23 DIAGNOSIS — F419 Anxiety disorder, unspecified: Secondary | ICD-10-CM | POA: Diagnosis not present

## 2019-08-23 DIAGNOSIS — Z7982 Long term (current) use of aspirin: Secondary | ICD-10-CM | POA: Diagnosis not present

## 2019-08-23 DIAGNOSIS — E785 Hyperlipidemia, unspecified: Secondary | ICD-10-CM | POA: Insufficient documentation

## 2019-08-23 DIAGNOSIS — Z87891 Personal history of nicotine dependence: Secondary | ICD-10-CM | POA: Insufficient documentation

## 2019-08-23 DIAGNOSIS — M797 Fibromyalgia: Secondary | ICD-10-CM | POA: Diagnosis not present

## 2019-08-23 DIAGNOSIS — M4854XA Collapsed vertebra, not elsewhere classified, thoracic region, initial encounter for fracture: Secondary | ICD-10-CM | POA: Diagnosis not present

## 2019-08-23 DIAGNOSIS — S22000A Wedge compression fracture of unspecified thoracic vertebra, initial encounter for closed fracture: Secondary | ICD-10-CM

## 2019-08-23 DIAGNOSIS — Z79899 Other long term (current) drug therapy: Secondary | ICD-10-CM | POA: Insufficient documentation

## 2019-08-23 DIAGNOSIS — S22080S Wedge compression fracture of T11-T12 vertebra, sequela: Secondary | ICD-10-CM | POA: Insufficient documentation

## 2019-08-23 HISTORY — PX: IR KYPHO THORACIC WITH BONE BIOPSY: IMG5518

## 2019-08-23 LAB — CBC
HCT: 39.9 % (ref 36.0–46.0)
Hemoglobin: 12.9 g/dL (ref 12.0–15.0)
MCH: 32.4 pg (ref 26.0–34.0)
MCHC: 32.3 g/dL (ref 30.0–36.0)
MCV: 100.3 fL — ABNORMAL HIGH (ref 80.0–100.0)
Platelets: 186 10*3/uL (ref 150–400)
RBC: 3.98 MIL/uL (ref 3.87–5.11)
RDW: 14.2 % (ref 11.5–15.5)
WBC: 6.6 10*3/uL (ref 4.0–10.5)
nRBC: 0 % (ref 0.0–0.2)

## 2019-08-23 LAB — BASIC METABOLIC PANEL
Anion gap: 12 (ref 5–15)
BUN: 15 mg/dL (ref 8–23)
CO2: 23 mmol/L (ref 22–32)
Calcium: 9.6 mg/dL (ref 8.9–10.3)
Chloride: 106 mmol/L (ref 98–111)
Creatinine, Ser: 0.99 mg/dL (ref 0.44–1.00)
GFR calc Af Amer: >60 mL/min (ref >60)
GFR calc non Af Amer: 53 mL/min — ABNORMAL LOW (ref >60)
Glucose, Bld: 121 mg/dL — ABNORMAL HIGH (ref 70–99)
Potassium: 3.9 mmol/L (ref 3.5–5.1)
Sodium: 141 mmol/L (ref 135–145)

## 2019-08-23 LAB — PROTIME-INR
INR: 1 (ref 0.8–1.2)
Prothrombin Time: 12.9 seconds (ref 11.4–15.2)

## 2019-08-23 MED ORDER — FENTANYL CITRATE (PF) 100 MCG/2ML IJ SOLN
INTRAMUSCULAR | Status: AC | PRN
  Administered 2019-08-23: 25 ug via INTRAVENOUS
  Administered 2019-08-23: 12.5 ug via INTRAVENOUS

## 2019-08-23 MED ORDER — FENTANYL CITRATE (PF) 100 MCG/2ML IJ SOLN
INTRAMUSCULAR | Status: AC
  Filled 2019-08-23: qty 2

## 2019-08-23 MED ORDER — MIDAZOLAM HCL 2 MG/2ML IJ SOLN
INTRAMUSCULAR | Status: AC
  Filled 2019-08-23: qty 2

## 2019-08-23 MED ORDER — IOHEXOL 300 MG/ML  SOLN
50.0000 mL | Freq: Once | INTRAMUSCULAR | Status: AC | PRN
  Administered 2019-08-23: 10 mL

## 2019-08-23 MED ORDER — LIDOCAINE HCL 1 % IJ SOLN
INTRAMUSCULAR | Status: AC | PRN
  Administered 2019-08-23: 15 mL

## 2019-08-23 MED ORDER — CEFAZOLIN SODIUM-DEXTROSE 2-4 GM/100ML-% IV SOLN
INTRAVENOUS | Status: AC
  Filled 2019-08-23: qty 100

## 2019-08-23 MED ORDER — MIDAZOLAM HCL 2 MG/2ML IJ SOLN
INTRAMUSCULAR | Status: AC | PRN
  Administered 2019-08-23 (×2): 0.5 mg via INTRAVENOUS

## 2019-08-23 MED ORDER — SODIUM CHLORIDE 0.9 % IV SOLN: INTRAVENOUS | Status: DC

## 2019-08-23 MED ORDER — LIDOCAINE HCL (PF) 1 % IJ SOLN
INTRAMUSCULAR | Status: AC
  Filled 2019-08-23: qty 30

## 2019-08-23 MED ORDER — CEFAZOLIN SODIUM-DEXTROSE 2-4 GM/100ML-% IV SOLN
2.0000 g | INTRAVENOUS | Status: AC
  Administered 2019-08-23: 2 g via INTRAVENOUS

## 2019-08-23 NOTE — Discharge Instructions (Signed)
Balloon Kyphoplasty, Care After This sheet gives you information about how to care for yourself after your procedure. Your health care provider may also give you more specific instructions. If you have problems or questions, contact your health care provider. What can I expect after the procedure? After your procedure, it is common to have back pain. Follow these instructions at home: Medicines  Take over-the-counter and prescription medicines only as told by your health care provider.  Ask your health care provider if the medicine prescribed to you: ? Requires you to avoid driving or using heavy machinery. ? Can cause constipation. You may need to take steps to prevent or treat constipation, such as:  Drink enough fluid to keep your urine pale yellow.  Take over-the-counter or prescription medicines.  Eat foods that are high in fiber, such as beans, whole grains, and fresh fruits and vegetables.  Limit foods that are high in fat and processed sugars, such as fried or sweet foods. Puncture site care   Follow instructions from your health care provider about how to take care of your puncture site. Make sure you: ? Wash your hands with soap and water before and after you change your bandage (dressing). If soap and water are not available, use hand sanitizer. ? Remove your dressing as told by your health care provider. In 24 hours  Check your puncture site every day for signs of infection. Watch for: ? Redness, swelling, or pain. ? Fluid or blood. ? Warmth. ? Pus or a bad smell.  Keep your dressing dry until your health care provider says that it can be removed. Managing pain, stiffness, and swelling   If directed, put ice on the painful area. ? Put ice in a plastic bag. ? Place a towel between your skin and the bag. ? Leave the ice on for 20 minutes, 2-3 times a day. Activity  Rest your back and avoid intense physical activity for as long as told by your health care  provider.  Avoid bending, lifting, or twisting your back for as long as told by your health care provider.  Return to your normal activities as told by your health care provider. Ask your health care provider what activities are safe for you.  Do not lift anything that is heavier than 5 lb (2.2 kg). You may need to avoid heavy lifting for several weeks. General instructions  Do not use any products that contain nicotine or tobacco, such as cigarettes, e-cigarettes, and chewing tobacco. These can delay bone healing. If you need help quitting, ask your health care provider.  Do not drive for 24 hours if you were given a sedative during your procedure.  Keep all follow-up visits as told by your health care provider. This is important. Contact a health care provider if:  You have a fever or chills.  You have redness, swelling, or pain at the site of your puncture.  You have fluid, blood, or pus coming from the puncture site.  You have pain that gets worse or does not get better with medicine.  You develop numbness or weakness in any part of your body. Get help right away if:  You have chest pain.  You have difficulty breathing.  You have weakness, numbness, or tingling in your legs.  You cannot control your bladder or bowel movements.  You suddenly become weak or numb on one side of your body.  You become very confused.  You have trouble speaking or understanding, or both. Summary  Follow instructions from your health care provider about how to take care of your puncture site.  Take over-the-counter and prescription medicines only as told by your health care provider.  Rest your back and avoid intense physical activity for as long as told by your health care provider.  Contact a health care provider if you have pain that gets worse or does not get better with medicine.  Keep all follow-up visits as told by your health care provider. This is important. This information  is not intended to replace advice given to you by your health care provider. Make sure you discuss any questions you have with your health care provider. Document Revised: 12/21/2017 Document Reviewed: 12/21/2017 Elsevier Patient Education  2020 [employer].

## 2019-08-23 NOTE — Procedures (Signed)
  Procedure: KP T11   EBL:   minimal Complications:  none immediate  See full dictation in [facility] PACS.  D. Winta Barcelo MD Main # [phone] Pager  [phone]    

## 2019-08-24 ENCOUNTER — Other Ambulatory Visit: Payer: PPO

## 2019-08-29 ENCOUNTER — Ambulatory Visit: Payer: PPO | Admitting: Orthopaedic Surgery

## 2019-08-29 DIAGNOSIS — S22000A Wedge compression fracture of unspecified thoracic vertebra, initial encounter for closed fracture: Secondary | ICD-10-CM | POA: Diagnosis not present

## 2019-08-29 DIAGNOSIS — M545 Low back pain, unspecified: Secondary | ICD-10-CM

## 2019-08-29 MED ORDER — METHOCARBAMOL 500 MG PO TABS
500.0000 mg | ORAL_TABLET | Freq: Four times a day (QID) | ORAL | 0 refills | Status: DC | PRN
Start: 2019-08-29 — End: 2019-12-27

## 2019-08-29 NOTE — Progress Notes (Signed)
The patient is returning for follow-up 6 days after having a T11 kyphoplasty by interventional radiology.  She is still reporting significant back pain but is only able to take Tylenol due to severe side effects including constipation from any narcotics.  She is ambulating without assistive device.  She says coughing causes severe pain.  On exam she has appropriate pain in her lower back as it relates to kyphoplasty types of procedures.  I did share with her the x-rays of the time of her procedure showing the bone cement being placed in the vertebral body.  It really looks good overall.  I gave her reassurance that some of the pain will subside with time but it does take time.  I at least will try sending in some methocarbamol to see if that will help since she does not tolerate narcotics.  All question concerns were answered and addressed.  We can reevaluate her in 3 weeks.  At that visit we will have a standing AP and lateral lumbar spine but really focus on T11 and T12.

## 2019-09-02 ENCOUNTER — Other Ambulatory Visit: Payer: Self-pay | Admitting: Interventional Radiology

## 2019-09-02 DIAGNOSIS — S22000A Wedge compression fracture of unspecified thoracic vertebra, initial encounter for closed fracture: Secondary | ICD-10-CM

## 2019-09-10 DIAGNOSIS — Z20828 Contact with and (suspected) exposure to other viral communicable diseases: Secondary | ICD-10-CM | POA: Diagnosis not present

## 2019-09-13 ENCOUNTER — Other Ambulatory Visit: Payer: PPO

## 2019-09-13 DIAGNOSIS — Z20822 Contact with and (suspected) exposure to covid-19: Secondary | ICD-10-CM | POA: Diagnosis not present

## 2019-09-13 DIAGNOSIS — J069 Acute upper respiratory infection, unspecified: Secondary | ICD-10-CM | POA: Diagnosis not present

## 2019-09-20 ENCOUNTER — Telehealth: Payer: Self-pay | Admitting: Orthopaedic Surgery

## 2019-09-20 NOTE — Telephone Encounter (Signed)
She can cancel that follow-up appointment for tomorrow.  She needs to just follow-up with the surgeon.  It is not appropriate to order an MRI of the same from my standpoint.  They may have a different opinion about that in terms of the surgeon, but usually do not order a repeat MRI this soon.  I would have her keep her follow-up appointment on 2 September with the other physician.

## 2019-09-20 NOTE — Telephone Encounter (Signed)
Patient called requesting a call back from Dr. Ninfa Linden. Patient states can Dr. Ninfa Linden order MRI before 9/2 before her surgery follow up appt with her back doctor. Patient states if Dr. Ninfa Linden can't order MRI then she will cancel appt for tomorrow 8/25. Please call this patient appt this matter [phone].

## 2019-09-20 NOTE — Telephone Encounter (Signed)
See below, patient asking if she should come in tomorrow or would it be waste of time She's asking if we will order an MRI for her or if she has to wait to see the surgeon 09/29/19

## 2019-09-20 NOTE — Telephone Encounter (Signed)
Patient aware of the  below message and her appt was cancelled for tomorrow

## 2019-09-21 ENCOUNTER — Ambulatory Visit: Payer: PPO | Admitting: Orthopaedic Surgery

## 2019-09-29 ENCOUNTER — Ambulatory Visit
Admission: RE | Admit: 2019-09-29 | Discharge: 2019-09-29 | Disposition: A | Discharge status: Home / Self Care | Payer: PPO | Source: Ambulatory Visit | Attending: Interventional Radiology | Admitting: Interventional Radiology

## 2019-09-29 ENCOUNTER — Other Ambulatory Visit: Payer: Self-pay | Admitting: Interventional Radiology

## 2019-09-29 DIAGNOSIS — S22080A Wedge compression fracture of T11-T12 vertebra, initial encounter for closed fracture: Secondary | ICD-10-CM | POA: Diagnosis not present

## 2019-09-29 DIAGNOSIS — S22000A Wedge compression fracture of unspecified thoracic vertebra, initial encounter for closed fracture: Secondary | ICD-10-CM

## 2019-09-29 DIAGNOSIS — M546 Pain in thoracic spine: Secondary | ICD-10-CM | POA: Diagnosis not present

## 2019-09-29 HISTORY — PX: IR RADIOLOGIST EVAL & MGMT: IMG5224

## 2019-09-29 NOTE — Progress Notes (Signed)
Chief Complaint: Patient was seen in consultation today for persistent back pain at the request of Hassell,Daniel  Referring Physician(s): Jean Rosenthal, MD  History of Present Illness: Zoe [name] is a 84 y.o. female who initially presented this past July with persistent pain from a subacute T11 compression fracture. She underwent percutaneous cement augmentation with kyphoplasty performed by my partner, Dr. Vernard Gambles, on 08/23/2019.  Unfortunately, following the procedure she had a significant exacerbation of her pain which she reports as a terrible experience.  Her husband subsequently underwent open heart surgery and her daughter was in town and had to help with both of them as she was almost completely incapacitated for the first 1-2 weeks following her kyphoplasty procedure.  Since then, she has regained some of her function but still finds it extremely challenging to stand for any prolonged period of time.  Standing results in severe pressure-like pains in her lower thoracic spine which radiate around her left ribs.  The pain is so severe that she has to sit down or lay down to achieve relief.  She notes it helps if she flexes her back and puts her head toward her knees.  She took a few Norco but these did not touch the pain.  Additionally, narcotics give her severe constipation and so when she did not see benefit she stopped taking them.  She has continued to take Tylenol which helps minimally.  Her pain remains quite severe and is limiting her ability to perform her activities of daily living and live her normally active lifestyle.  She denies new onset lower extremity weakness, incontinence or paresthesias.  Past Medical History:  Diagnosis Date  . Abdominal discomfort 11-02-12   suspected ?UTI- PCP MD placed on Cipro  . Anxiety   . Arthritis   . Asthma   . Cancer ([place])    skin CA  . Chronic constipation   . Constipation   . Cough   . Depression   .  Dysrhythmia    tachycardia  . Environmental allergies   . Fibromyalgia   . GERD (gastroesophageal reflux disease)   . Hemorrhoids   . High cholesterol   . Hypercholesteremia   . Hypertension   . Nausea   . Pneumonia    7+ YRS  . Urgency of urination     Past Surgical History:  Procedure Laterality Date  . ANAL RECTAL MANOMETRY N/A 11/28/2013   Procedure: ANO RECTAL MANOMETRY;  Surgeon: Leighton Ruff, MD;  Location: [facility];  Service: Endoscopy;  Laterality: N/A;  . APPENDECTOMY    . BACK SURGERY     X2   . CHOLECYSTECTOMY    . COLONOSCOPY  03/29/03  . COLONOSCOPY N/A 12/09/2013   Procedure: COLONOSCOPY;  Surgeon: Rogene Houston, MD;  Location: [facility];  Service: Endoscopy;  Laterality: N/A;  730  . COLONOSCOPY N/A 02/04/2017   Procedure: COLONOSCOPY;  Surgeon: Rogene Houston, MD;  Location: [facility];  Service: Endoscopy;  Laterality: N/A;  930  . EYE SURGERY     bilateral cataract removal  . IR KYPHO THORACIC WITH BONE BIOPSY  08/23/2019  . IR RADIOLOGIST EVAL & MGMT  08/03/2019  . SKIN CANCER EXCISION     RIGHT NECK    . TONSILLECTOMY     T+A  . TOTAL HIP ARTHROPLASTY Right 09/14/2012   Procedure: RIGHT TOTAL HIP ARTHROPLASTY ANTERIOR APPROACH and RIGHT KNEE STEROID INJECTION;  Surgeon: Mcarthur Rossetti, MD;  Location: [facility];  Service:  Orthopedics;  Laterality: Right;  . TOTAL HIP ARTHROPLASTY Left 11/05/2012   Procedure: LEFT TOTAL HIP ARTHROPLASTY ANTERIOR APPROACH;  Surgeon: Mcarthur Rossetti, MD;  Location: [facility];  Service: Orthopedics;  Laterality: Left;  . UMBILICAL HERNIA REPAIR  04/26/03   JENKINS  . UPPER GASTROINTESTINAL ENDOSCOPY  08/03/2009   NUR  . UPPER GASTROINTESTINAL ENDOSCOPY  03/29/2003   [facility] ED TCS    Allergies: Codeine, Escitalopram, Other, Oxycodone, Paxil [paroxetine hcl], and Procaine  Medications: Prior to Admission medications   Medication Sig Start Date End Date Taking? Authorizing Provider  albuterol (PROVENTIL  HFA;VENTOLIN HFA) 108 (90 BASE) MCG/ACT inhaler Inhale 2 puffs into the lungs every 6 (six) hours as needed for wheezing or shortness of breath.    [provider]  allopurinol (ZYLOPRIM) 300 MG tablet Take 300 mg by mouth daily. 03/10/11   [provider]  ALPRAZolam Duanne Moron) 0.5 MG tablet Take 0.5 mg by mouth 2 (two) times daily as needed for anxiety.     [provider]  amLODipine (NORVASC) 2.5 MG tablet Take 2.5 mg by mouth 3 (three) times a week. 08/04/19   [provider]  aspirin 81 MG tablet Take 81 mg by mouth daily.    [provider]  Biotin 5000 MCG CAPS Take 5,000 mcg by mouth daily.    [provider]  diclofenac Sodium (VOLTAREN) 1 % GEL APPLY 2-4 GRAMS TOPICALLY FOUR TIMES DAILY Patient not taking: Reported on 08/19/2019 01/13/19   Mcarthur Rossetti, MD  digoxin (LANOXIN) 0.125 MG tablet Take 125 mcg by mouth daily. 08/04/19   [provider]  doxepin (SINEQUAN) 100 MG capsule Take 100 mg by mouth at bedtime.     [provider]  fexofenadine (ALLEGRA) 60 MG tablet Take 60 mg by mouth daily.     [provider]  gabapentin (NEURONTIN) 300 MG capsule Take 1 capsule (300 mg total) by mouth 3 (three) times daily. Patient taking differently: Take 300 mg by mouth 2 (two) times daily.  05/19/18   Mcarthur Rossetti, MD  ipratropium-albuterol (DUONEB) 0.5-2.5 (3) MG/3ML SOLN Inhale 3 mLs into the lungs every 4 (four) hours as needed (SOB).  10/12/18   [provider]  lubiprostone (AMITIZA) 24 MCG capsule Take 24 mcg by mouth 2 (two) times daily with a meal.    [provider]  methocarbamol (ROBAXIN) 500 MG tablet Take 1 tablet (500 mg total) by mouth every 6 (six) hours as needed for muscle spasms. 08/29/19   Mcarthur Rossetti, MD  Multiple Vitamin (MULTIVITAMIN) tablet Take 1 tablet by mouth daily.    [provider]  naproxen (NAPROSYN) 500 MG tablet Take 500 mg by mouth 2  (two) times daily with a meal.     [provider]  omeprazole (PRILOSEC) 20 MG capsule Take 20 mg by mouth every morning.    [provider]  Plecanatide (TRULANCE) 3 MG TABS Take 3 mg by mouth daily with breakfast. Patient not taking: Reported on 08/19/2019 06/21/19   Rogene Houston, MD  pravastatin (PRAVACHOL) 20 MG tablet Take 20 mg by mouth every other day.     [provider]  senna (SENOKOT) 8.6 MG tablet Take 5 tablets (43 mg total) by mouth daily. Patient taking differently: Take 5 tablets by mouth 2 (two) times daily.  06/21/19   Rogene Houston, MD     Family History  Problem Relation Age of Onset  . Healthy Son   .  Healthy Son   . Healthy Daughter   . Colon cancer Neg Hx     Social History   Socioeconomic History  . Marital status: Married    Spouse name: Not on file  . Number of children: Not on file  . Years of education: Not on file  . Highest education level: Not on file  Occupational History  . Not on file  Tobacco Use  . Smoking status: Former Smoker    Packs/day: 0.25    Years: 50.00    Pack years: 12.50    Types: Cigarettes    Quit date: 11/10/2018    Years since quitting: 0.8  . Smokeless tobacco: Never Used  . Tobacco comment: Hasn't smoked in 5-6 weeks  Vaping Use  . Vaping Use: Never used  Substance and Sexual Activity  . Alcohol use: Yes    Alcohol/week: 0.0 standard drinks    Comment: rare  . Drug use: No  . Sexual activity: Not Currently    Birth control/protection: Post-menopausal  Other Topics Concern  . Not on file  Social History Narrative  . Not on file   Social Determinants of Health   Financial Resource Strain:   . Difficulty of Paying Living Expenses: Not on file  Food Insecurity:   . Worried About Charity fundraiser in the Last Year: Not on file  . Ran Out of Food in the Last Year: Not on file  Transportation Needs:   . Lack of Transportation (Medical): Not on file  . Lack of Transportation  (Non-Medical): Not on file  Physical Activity:   . Days of Exercise per Week: Not on file  . Minutes of Exercise per Session: Not on file  Stress:   . Feeling of Stress : Not on file  Social Connections:   . Frequency of Communication with Friends and Family: Not on file  . Frequency of Social Gatherings with Friends and Family: Not on file  . Attends Religious Services: Not on file  . Active Member of Clubs or Organizations: Not on file  . Attends Archivist Meetings: Not on file  . Marital Status: Not on file    Review of Systems: A 12 point ROS discussed and pertinent positives are indicated in the HPI above.  All other systems are negative.  Review of Systems  Vital Signs: There were no vitals taken for this visit.  Physical Exam Constitutional:      General: She is not in acute distress.    Appearance: Normal appearance.  HENT:     Head: Normocephalic and atraumatic.  Eyes:     General: No scleral icterus. Cardiovascular:     Rate and Rhythm: Normal rate.  Pulmonary:     Effort: Pulmonary effort is normal.  Abdominal:     General: Abdomen is flat.     Palpations: Abdomen is soft.  Musculoskeletal:       Back:     Comments: TTP overlying the T9/T10 spinous process.  Minimally TTP as well higher up around T6.   Skin:    General: Skin is warm and dry.  Neurological:     Mental Status: She is alert and oriented to person, place, and time.  Psychiatric:        Mood and Affect: Mood normal.        Behavior: Behavior normal.      Imaging: No results found.  Labs:  CBC: Recent Labs    08/23/19 0805  WBC 6.6  HGB 12.9  HCT 39.9  PLT 186    COAGS: Recent Labs    08/23/19 0805  INR 1.0    BMP: Recent Labs    08/23/19 0805  NA 141  K 3.9  CL 106  CO2 23  GLUCOSE 121*  BUN 15  CALCIUM 9.6  CREATININE 0.99  GFRNONAA 53*  GFRAA >60    LIVER FUNCTION TESTS: No results for input(s): BILITOT, AST, ALT, ALKPHOS, PROT, ALBUMIN in  the last 8760 hours.  TUMOR MARKERS: No results for input(s): AFPTM, CEA, CA199, CHROMGRNA in the last 8760 hours.  Assessment and Plan:  Unfortunately, Mrs. Sheldon Silvan has experienced persistent and increased pain since her recent T11 kyphoplasty on 08/23/2019.  Her physical exam findings today are concerning for an adjacent level fracture, I am suspicious for T9 or T10.  I believe it is less likely that this is due to some complication related to her procedure.  We need further imaging to assess for new fractures.  Her symptoms remain focal to the thoracic spine with fairly minimal symptoms in the lumbar spine.  1.)  Repeat MRI thoracic spine.  Once I have reviewed her imaging, I will need a follow-up phone consultation to discuss potential treatment options.  She would prefer to have her MRI at [place] if possible.   Electronically Signed: Jacqulynn Cadet 09/29/2019, 10:43 AM   I spent a total of  25 Minutes in face to face in clinical consultation, greater than 50% of which was counseling/coordinating care for thoracic spine pain, known T11 fracture s/p KP.

## 2019-10-06 ENCOUNTER — Other Ambulatory Visit: Payer: Self-pay | Admitting: Interventional Radiology

## 2019-10-06 DIAGNOSIS — M545 Low back pain, unspecified: Secondary | ICD-10-CM

## 2019-10-06 DIAGNOSIS — S22000A Wedge compression fracture of unspecified thoracic vertebra, initial encounter for closed fracture: Secondary | ICD-10-CM

## 2019-10-07 ENCOUNTER — Other Ambulatory Visit (HOSPITAL_COMMUNITY): Payer: PPO

## 2019-10-07 ENCOUNTER — Telehealth: Payer: Self-pay | Admitting: Orthopaedic Surgery

## 2019-10-07 NOTE — Telephone Encounter (Signed)
Can we schedule her another injection in this time from

## 2019-10-07 NOTE — Telephone Encounter (Signed)
Pt is going to the beach in 3 weeks and would like to get her lower back injection or her left arm injection before then; pt would prefer to get the lower back done but if that's not possible she would like to do her arm. Pt also states she does have an MRI on 10/13/19 for her thoracic.  [phone] Pt would like a CB

## 2019-10-10 ENCOUNTER — Telehealth: Payer: Self-pay | Admitting: Orthopaedic Surgery

## 2019-10-10 NOTE — Telephone Encounter (Signed)
Scheduled for OV on 10/5 at 1030 and added to wait list.

## 2019-10-10 NOTE — Telephone Encounter (Signed)
She would like one in her shoulder as well, is that ok?

## 2019-10-10 NOTE — Telephone Encounter (Signed)
Please advise 

## 2019-10-10 NOTE — Telephone Encounter (Signed)
See below, what kindof injection would she need from [facility]?

## 2019-10-10 NOTE — Telephone Encounter (Signed)
See below

## 2019-10-10 NOTE — Telephone Encounter (Signed)
I saw that the interventional radiologist saw her again recently and he was concerned about the potential for compression fractures around her previous compression fracture and he has ordered a MRI of her thoracic spine to evaluate for other compression fractures.  I am not sure at what level to have Dr. Ernestina Patches inject since she has significant disease at L3-L4 and L5-S1.  I have not seen her back since early August when she was just 6 days out from her kyphoplasty.  Try to find out if she is having any pain that is radiating down either leg and then we can determine potentially where to recommend an injection versus do we just need to have her see Dr. Ernestina Patches as a consultation so he can help determine where injection may best be placed.

## 2019-10-10 NOTE — Telephone Encounter (Signed)
Yes sometime next week is fine to inject her shoulder Ninfa Linden or Artis Delay

## 2019-10-10 NOTE — Telephone Encounter (Signed)
Patient called. She would like to have an injection on her shoulder before she leaves to go out of town on 9/26. She would like a call from [place]. [phone]

## 2019-10-10 NOTE — Telephone Encounter (Signed)
What kind of injection is Dr. Ninfa Linden requesting?

## 2019-10-10 NOTE — Telephone Encounter (Signed)
I am okay with me or Zoe [name] seen her to inject her shoulder.

## 2019-10-10 NOTE — Telephone Encounter (Signed)
Ov

## 2019-10-12 ENCOUNTER — Ambulatory Visit: Payer: PPO | Admitting: Cardiology

## 2019-10-13 ENCOUNTER — Telehealth: Payer: Self-pay | Admitting: Physical Medicine and Rehabilitation

## 2019-10-13 ENCOUNTER — Ambulatory Visit (HOSPITAL_COMMUNITY)
Admission: RE | Admit: 2019-10-13 | Discharge: 2019-10-13 | Disposition: A | Discharge status: Home / Self Care | Payer: PPO | Source: Ambulatory Visit | Attending: Interventional Radiology | Admitting: Interventional Radiology

## 2019-10-13 ENCOUNTER — Other Ambulatory Visit: Payer: Self-pay

## 2019-10-13 DIAGNOSIS — S22000A Wedge compression fracture of unspecified thoracic vertebra, initial encounter for closed fracture: Secondary | ICD-10-CM | POA: Diagnosis not present

## 2019-10-13 DIAGNOSIS — S22068A Other fracture of T7-T8 thoracic vertebra, initial encounter for closed fracture: Secondary | ICD-10-CM | POA: Diagnosis not present

## 2019-10-13 DIAGNOSIS — S22089A Unspecified fracture of T11-T12 vertebra, initial encounter for closed fracture: Secondary | ICD-10-CM | POA: Diagnosis not present

## 2019-10-13 DIAGNOSIS — S22050A Wedge compression fracture of T5-T6 vertebra, initial encounter for closed fracture: Secondary | ICD-10-CM | POA: Diagnosis not present

## 2019-10-13 DIAGNOSIS — M4319 Spondylolisthesis, multiple sites in spine: Secondary | ICD-10-CM | POA: Diagnosis not present

## 2019-10-13 MED ORDER — GADOBUTROL 1 MMOL/ML IV SOLN
7.0000 mL | Freq: Once | INTRAVENOUS | Status: AC | PRN
  Administered 2019-10-13: 7 mL via INTRAVENOUS

## 2019-10-13 NOTE — Telephone Encounter (Signed)
Patient called asked if she can get a sooner appointment with Dr Ernestina Patches. Patient said she is not going out out of town now and can come anytime. The number to contact patient is [phone]

## 2019-10-14 ENCOUNTER — Other Ambulatory Visit: Payer: Self-pay | Admitting: Interventional Radiology

## 2019-10-14 DIAGNOSIS — S22000A Wedge compression fracture of unspecified thoracic vertebra, initial encounter for closed fracture: Secondary | ICD-10-CM

## 2019-10-14 NOTE — Telephone Encounter (Signed)
Spoke to husband and rescheduled appointment. Patient will call back if this does not work. I will hold original appointment until patient calls back later this morning.

## 2019-10-18 ENCOUNTER — Ambulatory Visit
Admission: RE | Admit: 2019-10-18 | Discharge: 2019-10-18 | Disposition: A | Discharge status: Home / Self Care | Payer: PPO | Source: Ambulatory Visit | Attending: Interventional Radiology | Admitting: Interventional Radiology

## 2019-10-18 ENCOUNTER — Telehealth: Payer: Self-pay | Admitting: Physical Medicine and Rehabilitation

## 2019-10-18 ENCOUNTER — Encounter: Payer: Self-pay | Admitting: *Deleted

## 2019-10-18 DIAGNOSIS — S22080A Wedge compression fracture of T11-T12 vertebra, initial encounter for closed fracture: Secondary | ICD-10-CM | POA: Diagnosis not present

## 2019-10-18 DIAGNOSIS — S32010A Wedge compression fracture of first lumbar vertebra, initial encounter for closed fracture: Secondary | ICD-10-CM | POA: Diagnosis not present

## 2019-10-18 DIAGNOSIS — S22060A Wedge compression fracture of T7-T8 vertebra, initial encounter for closed fracture: Secondary | ICD-10-CM | POA: Diagnosis not present

## 2019-10-18 DIAGNOSIS — S22000A Wedge compression fracture of unspecified thoracic vertebra, initial encounter for closed fracture: Secondary | ICD-10-CM

## 2019-10-18 HISTORY — PX: IR RADIOLOGIST EVAL & MGMT: IMG5224

## 2019-10-18 NOTE — Telephone Encounter (Signed)
Patient called requesting a call back about her upcoming appt. Please call patient at [phone].

## 2019-10-18 NOTE — Progress Notes (Addendum)
Chief Complaint: Patient was seen in consultation today for compression fractures at the request of [facility]  Referring Physician(s): Jean Rosenthal, MD  History of Present Illness: Zoe [name] is a 84 y.o. female who initially presented this past July with persistent pain from a subacute T11 compression fracture. She underwent percutaneous cement augmentation with kyphoplasty performed by my partner, Dr. Vernard Gambles, on 08/23/2019.  Unfortunately, following the procedure she had a significant exacerbation of her pain which she reports as a terrible experience.  Her husband subsequently underwent open heart surgery and her daughter was in town and had to help with both of them as she was almost completely incapacitated for the first 1-2 weeks following her kyphoplasty procedure.  Since then, she has regained some of her function but still finds it extremely challenging to stand for any prolonged period of time.  Standing results in severe pressure-like pains in her lower thoracic spine which radiate around her left ribs.  The pain is so severe that she has to sit down or lay down to achieve relief.  She notes it helps if she flexes her back and puts her head toward her knees.  She took a few Norco but these did not touch the pain.  Additionally, narcotics give her severe constipation and so when she did not see benefit she stopped taking them.  She has continued to take Tylenol which helps minimally.  Her pain remains quite severe and is limiting her ability to perform her activities of daily living and live her normally active lifestyle.  She had a repeat MRI of the thoracic spine performed on 10/13/2019 which demonstrated 3 new acute fractures involving the superior endplate of T7, with more significant fractures and edema at T12 and L1.  For that reason, we brought her back into the office today to reexamine her back.  She is not really tender at the T7 site, however she is  quite tender at T12 and L1 and has significant pain radiating along the right T12 rib.  Unfortunately, she also has fairly significant tenderness and symptoms in the lower lumbar spine at the L4-L5 level which was not included in the field-of-view on her recent thoracic spine MRI.  She denies new onset lower extremity weakness, incontinence or paresthesias.  Past Medical History:  Diagnosis Date  . Abdominal discomfort 11-02-12   suspected ?UTI- PCP MD placed on Cipro  . Anxiety   . Arthritis   . Asthma   . Cancer ([place])    skin CA  . Chronic constipation   . Constipation   . Cough   . Depression   . Dysrhythmia    tachycardia  . Environmental allergies   . Fibromyalgia   . GERD (gastroesophageal reflux disease)   . Hemorrhoids   . High cholesterol   . Hypercholesteremia   . Hypertension   . Nausea   . Pneumonia    7+ YRS  . Urgency of urination     Past Surgical History:  Procedure Laterality Date  . ANAL RECTAL MANOMETRY N/A 11/28/2013   Procedure: ANO RECTAL MANOMETRY;  Surgeon: Leighton Ruff, MD;  Location: [facility];  Service: Endoscopy;  Laterality: N/A;  . APPENDECTOMY    . BACK SURGERY     X2   . CHOLECYSTECTOMY    . COLONOSCOPY  03/29/03  . COLONOSCOPY N/A 12/09/2013   Procedure: COLONOSCOPY;  Surgeon: Rogene Houston, MD;  Location: [facility];  Service: Endoscopy;  Laterality: N/A;  730  .  COLONOSCOPY N/A 02/04/2017   Procedure: COLONOSCOPY;  Surgeon: Rogene Houston, MD;  Location: [facility];  Service: Endoscopy;  Laterality: N/A;  930  . EYE SURGERY     bilateral cataract removal  . IR KYPHO THORACIC WITH BONE BIOPSY  08/23/2019  . IR RADIOLOGIST EVAL & MGMT  08/03/2019  . IR RADIOLOGIST EVAL & MGMT  09/29/2019  . IR RADIOLOGIST EVAL & MGMT  10/18/2019  . SKIN CANCER EXCISION     RIGHT NECK    . TONSILLECTOMY     T+A  . TOTAL HIP ARTHROPLASTY Right 09/14/2012   Procedure: RIGHT TOTAL HIP ARTHROPLASTY ANTERIOR APPROACH and RIGHT KNEE STEROID  INJECTION;  Surgeon: Mcarthur Rossetti, MD;  Location: [facility];  Service: Orthopedics;  Laterality: Right;  . TOTAL HIP ARTHROPLASTY Left 11/05/2012   Procedure: LEFT TOTAL HIP ARTHROPLASTY ANTERIOR APPROACH;  Surgeon: Mcarthur Rossetti, MD;  Location: [facility];  Service: Orthopedics;  Laterality: Left;  . UMBILICAL HERNIA REPAIR  04/26/03   JENKINS  . UPPER GASTROINTESTINAL ENDOSCOPY  08/03/2009   NUR  . UPPER GASTROINTESTINAL ENDOSCOPY  03/29/2003   EGD ED TCS    Allergies: Codeine, Escitalopram, Other, Oxycodone, Paxil [paroxetine hcl], and Procaine  Medications: Prior to Admission medications   Medication Sig Start Date End Date Taking? Authorizing Provider  albuterol (PROVENTIL HFA;VENTOLIN HFA) 108 (90 BASE) MCG/ACT inhaler Inhale 2 puffs into the lungs every 6 (six) hours as needed for wheezing or shortness of breath.    [provider]  allopurinol (ZYLOPRIM) 300 MG tablet Take 300 mg by mouth daily. 03/10/11   [provider]  ALPRAZolam Duanne Moron) 0.5 MG tablet Take 0.5 mg by mouth 2 (two) times daily as needed for anxiety.     [provider]  amLODipine (NORVASC) 2.5 MG tablet Take 2.5 mg by mouth 3 (three) times a week. 08/04/19   [provider]  aspirin 81 MG tablet Take 81 mg by mouth daily.    [provider]  Biotin 5000 MCG CAPS Take 5,000 mcg by mouth daily.    [provider]  diclofenac Sodium (VOLTAREN) 1 % GEL APPLY 2-4 GRAMS TOPICALLY FOUR TIMES DAILY Patient not taking: Reported on 08/19/2019 01/13/19   Mcarthur Rossetti, MD  digoxin (LANOXIN) 0.125 MG tablet Take 125 mcg by mouth daily. 08/04/19   [provider]  doxepin (SINEQUAN) 100 MG capsule Take 100 mg by mouth at bedtime.     [provider]  fexofenadine (ALLEGRA) 60 MG tablet Take 60 mg by mouth daily.     [provider]  gabapentin (NEURONTIN) 300 MG capsule Take 1 capsule (300 mg total) by mouth 3 (three) times  daily. Patient taking differently: Take 300 mg by mouth 2 (two) times daily.  05/19/18   Mcarthur Rossetti, MD  ipratropium-albuterol (DUONEB) 0.5-2.5 (3) MG/3ML SOLN Inhale 3 mLs into the lungs every 4 (four) hours as needed (SOB).  10/12/18   [provider]  lubiprostone (AMITIZA) 24 MCG capsule Take 24 mcg by mouth 2 (two) times daily with a meal.    [provider]  methocarbamol (ROBAXIN) 500 MG tablet Take 1 tablet (500 mg total) by mouth every 6 (six) hours as needed for muscle spasms. 08/29/19   Mcarthur Rossetti, MD  Multiple Vitamin (MULTIVITAMIN) tablet Take 1 tablet by mouth daily.    [provider]  naproxen (NAPROSYN) 500 MG tablet Take 500 mg by mouth 2 (two) times daily with a meal.  [provider]  omeprazole (PRILOSEC) 20 MG capsule Take 20 mg by mouth every morning.    [provider]  Plecanatide (TRULANCE) 3 MG TABS Take 3 mg by mouth daily with breakfast. Patient not taking: Reported on 08/19/2019 06/21/19   Rogene Houston, MD  pravastatin (PRAVACHOL) 20 MG tablet Take 20 mg by mouth every other day.     [provider]  senna (SENOKOT) 8.6 MG tablet Take 5 tablets (43 mg total) by mouth daily. Patient taking differently: Take 5 tablets by mouth 2 (two) times daily.  06/21/19   Rogene Houston, MD     Family History  Problem Relation Age of Onset  . Healthy Son   . Healthy Son   . Healthy Daughter   . Colon cancer Neg Hx     Social History   Socioeconomic History  . Marital status: Married    Spouse name: Not on file  . Number of children: Not on file  . Years of education: Not on file  . Highest education level: Not on file  Occupational History  . Not on file  Tobacco Use  . Smoking status: Former Smoker    Packs/day: 0.25    Years: 50.00    Pack years: 12.50    Types: Cigarettes    Quit date: 11/10/2018    Years since quitting: 0.9  . Smokeless tobacco: Never Used  . Tobacco comment:  Hasn't smoked in 5-6 weeks  Vaping Use  . Vaping Use: Never used  Substance and Sexual Activity  . Alcohol use: Yes    Alcohol/week: 0.0 standard drinks    Comment: rare  . Drug use: No  . Sexual activity: Not Currently    Birth control/protection: Post-menopausal  Other Topics Concern  . Not on file  Social History Narrative  . Not on file   Social Determinants of Health   Financial Resource Strain:   . Difficulty of Paying Living Expenses: Not on file  Food Insecurity:   . Worried About Charity fundraiser in the Last Year: Not on file  . Ran Out of Food in the Last Year: Not on file  Transportation Needs:   . Lack of Transportation (Medical): Not on file  . Lack of Transportation (Non-Medical): Not on file  Physical Activity:   . Days of Exercise per Week: Not on file  . Minutes of Exercise per Session: Not on file  Stress:   . Feeling of Stress : Not on file  Social Connections:   . Frequency of Communication with Friends and Family: Not on file  . Frequency of Social Gatherings with Friends and Family: Not on file  . Attends Religious Services: Not on file  . Active Member of Clubs or Organizations: Not on file  . Attends Archivist Meetings: Not on file  . Marital Status: Not on file    Review of Systems: A 12 point ROS discussed and pertinent positives are indicated in the HPI above.  All other systems are negative.  Review of Systems  Vital Signs: There were no vitals taken for this visit.  Physical Exam Vitals reviewed.  Constitutional:      General: She is not in acute distress.    Appearance: Normal appearance.  HENT:     Head: Normocephalic and atraumatic.  Eyes:     General: No scleral icterus. Cardiovascular:     Rate and Rhythm: Normal rate.  Pulmonary:     Effort: Pulmonary effort  is normal.  Abdominal:     General: Abdomen is flat.     Palpations: Abdomen is soft.  Musculoskeletal:       Back:     Comments: TTP at T12, L1 and  lower at L4/L5  Neurological:     Mental Status: She is alert.     Imaging: MR THORACIC SPINE W WO CONTRAST  Result Date: 10/14/2019 CLINICAL DATA:  Thoracic compression fracture. EXAM: MRI THORACIC WITHOUT AND WITH CONTRAST TECHNIQUE: Multiplanar and multiecho pulse sequences of the thoracic spine were obtained without and with intravenous contrast. CONTRAST:  60mL GADAVIST GADOBUTROL 1 MMOL/ML IV SOLN COMPARISON:  MRI thoracic spine 07/22/2019 FINDINGS: MRI THORACIC SPINE FINDINGS Alignment: Mild anterolisthesis T10-11, T11-T12, T12-L1. Mild retrolisthesis L1-2. Vertebrae: T6 compression fracture appears chronic and unchanged from the prior study Mild superior endplate fracture T7 is new since the prior study and shows mild bone marrow edema and enhancement. Moderate superior endplate fracture of X90 shows interval healing since the prior study. Minimal residual edema and enhancement. Interval kyphoplasty on the right. No further fracture T11 Mild superior endplate fracture of W40 is new since the prior study with diffuse bone marrow edema and enhancement. No bony retropulsion Mild compression fracture superior endplate of L1 is new since the prior study with bone marrow edema and enhancement. No bony retropulsion. Cord:  Normal signal and morphology.  Negative for cord compression. Paraspinal and other soft tissues: Negative Disc levels: Mild disc bulging at T5-6 and T6-7. Central disc protrusion and spurring at T9-10. Bilateral foraminal encroachment right greater than left due to spurring. Mild disc bulging T10-11, T11-12, and T12-L1, and L1-2 with associated endplate spurring. IMPRESSION: 1. Compared with the recent MRI of 07/22/2019, there are new fractures involving T7, T12, and L1. 2. Chronic fracture T6 unchanged 3. Healing fracture T11 unchanged with interval kyphoplasty on the right. 4. No significant spinal stenosis or cord compression. 5. Right foraminal encroachment due to spurring at T9-10  unchanged. Electronically Signed   By: Franchot Gallo M.D.   On: 10/14/2019 08:50   IR Radiologist Eval & Mgmt  Result Date: 10/18/2019 Please refer to notes tab for details about interventional procedure. (Op Note)  IR Radiologist Eval & Mgmt  Result Date: 09/29/2019 Please refer to notes tab for details about interventional procedure. (Op Note)   Labs:  CBC: Recent Labs    08/23/19 0805  WBC 6.6  HGB 12.9  HCT 39.9  PLT 186    COAGS: Recent Labs    08/23/19 0805  INR 1.0    BMP: Recent Labs    08/23/19 0805  NA 141  K 3.9  CL 106  CO2 23  GLUCOSE 121*  BUN 15  CALCIUM 9.6  CREATININE 0.99  GFRNONAA 53*  GFRAA >60    LIVER FUNCTION TESTS: No results for input(s): BILITOT, AST, ALT, ALKPHOS, PROT, ALBUMIN in the last 8760 hours.  TUMOR MARKERS: No results for input(s): AFPTM, CEA, CA199, CHROMGRNA in the last 8760 hours.  Assessment and Plan:  Persistent severe lower thoracic and lumbar spine pain.  The most recent thoracic spine MRI demonstrates acute fractures at T7, T12 and L1.  The T12 and L1 fractures are clearly symptomatic on physical exam and would benefit from percutaneous cement augmentation.  However, she also has significant symptoms and point tenderness in the lower lumbar spine in the region of L4/L5.  This was not included in the field-of-view of her thoracic spine MRI.  We really need an additional  lumbar spine MRI to make sure we are not missing another site of acute fracture.  1.) Lumbar spine MRI 2.)  Schedule for percutaneous cement augmentation with kyphoplasty to be performed at [facility] by me.  We will be treating T12, L1 and potentially any additional fractures in the lower lumbar spine. 3.) Tramadol 25 mg PO as needed for pain, script issued.  4.) Also wrote for Rollator walker    Electronically Signed: Jacqulynn Cadet 10/18/2019, 12:09 PM   I spent a total of  15 Minutes in face to face in clinical consultation,  greater than 50% of which was counseling/coordinating care for back pain, compression fractures.

## 2019-10-19 ENCOUNTER — Ambulatory Visit: Payer: PPO | Admitting: Orthopaedic Surgery

## 2019-10-19 NOTE — Telephone Encounter (Signed)
Returned patients call.

## 2019-10-20 ENCOUNTER — Ambulatory Visit (HOSPITAL_COMMUNITY)
Admission: RE | Admit: 2019-10-20 | Discharge: 2019-10-20 | Disposition: A | Discharge status: Home / Self Care | Payer: PPO | Source: Ambulatory Visit | Attending: Interventional Radiology | Admitting: Interventional Radiology

## 2019-10-20 ENCOUNTER — Other Ambulatory Visit: Payer: Self-pay

## 2019-10-20 DIAGNOSIS — M545 Low back pain, unspecified: Secondary | ICD-10-CM

## 2019-10-20 DIAGNOSIS — S22000A Wedge compression fracture of unspecified thoracic vertebra, initial encounter for closed fracture: Secondary | ICD-10-CM | POA: Diagnosis not present

## 2019-10-20 DIAGNOSIS — M7138 Other bursal cyst, other site: Secondary | ICD-10-CM | POA: Diagnosis not present

## 2019-10-20 DIAGNOSIS — M48061 Spinal stenosis, lumbar region without neurogenic claudication: Secondary | ICD-10-CM | POA: Diagnosis not present

## 2019-10-20 DIAGNOSIS — M5126 Other intervertebral disc displacement, lumbar region: Secondary | ICD-10-CM | POA: Diagnosis not present

## 2019-10-20 DIAGNOSIS — M47816 Spondylosis without myelopathy or radiculopathy, lumbar region: Secondary | ICD-10-CM | POA: Diagnosis not present

## 2019-10-20 MED ORDER — GADOBUTROL 1 MMOL/ML IV SOLN
7.0000 mL | Freq: Once | INTRAVENOUS | Status: AC | PRN
  Administered 2019-10-20: 7 mL via INTRAVENOUS

## 2019-10-26 ENCOUNTER — Ambulatory Visit: Payer: PPO | Admitting: Physical Medicine and Rehabilitation

## 2019-10-28 ENCOUNTER — Encounter: Payer: Self-pay | Admitting: Orthopaedic Surgery

## 2019-10-28 ENCOUNTER — Other Ambulatory Visit (HOSPITAL_COMMUNITY): Payer: PPO

## 2019-10-31 ENCOUNTER — Telehealth: Payer: Self-pay

## 2019-11-01 ENCOUNTER — Ambulatory Visit (INDEPENDENT_AMBULATORY_CARE_PROVIDER_SITE_OTHER): Payer: PPO | Admitting: Family Medicine

## 2019-11-01 ENCOUNTER — Ambulatory Visit: Payer: PPO | Admitting: Physical Medicine and Rehabilitation

## 2019-11-01 ENCOUNTER — Ambulatory Visit: Payer: Self-pay

## 2019-11-01 ENCOUNTER — Encounter: Payer: Self-pay | Admitting: Family Medicine

## 2019-11-01 ENCOUNTER — Other Ambulatory Visit: Payer: Self-pay

## 2019-11-01 DIAGNOSIS — G8929 Other chronic pain: Secondary | ICD-10-CM | POA: Diagnosis not present

## 2019-11-01 DIAGNOSIS — M25512 Pain in left shoulder: Secondary | ICD-10-CM

## 2019-11-01 DIAGNOSIS — M25511 Pain in right shoulder: Secondary | ICD-10-CM

## 2019-11-01 NOTE — Progress Notes (Signed)
Subjective: Patient is here for ultrasound-guided intra-articular bilateral glenohumeral injection.  Prior injections gave some relief.  Objective:  Pain with overhead reach.  Right shoulder hurts inferior to the shoulder in the latissimus area.  Procedure: Ultrasound-guided bilateral glenohumeral injection: After sterile prep with Betadine, injected 8 cc 1% lidocaine without epinephrine and 40 mg methylprednisolone using a 22-gauge spinal needle, passing the needle from posterior approach into the glenohumeral joint.  Injectate seen filling both joint capsules.

## 2019-11-04 ENCOUNTER — Telehealth: Payer: Self-pay | Admitting: Family Medicine

## 2019-11-04 ENCOUNTER — Other Ambulatory Visit (HOSPITAL_COMMUNITY): Payer: Self-pay | Admitting: Interventional Radiology

## 2019-11-04 ENCOUNTER — Encounter: Payer: Self-pay | Admitting: Orthopaedic Surgery

## 2019-11-04 DIAGNOSIS — S22080S Wedge compression fracture of T11-T12 vertebra, sequela: Secondary | ICD-10-CM

## 2019-11-04 NOTE — Telephone Encounter (Signed)
Patient called advised her left arm is swollen and sore after the injection. Patient said she applied ice and the swelling went down some. Patient want to know should she be concerned? The number to contact patient is [phone]

## 2019-11-07 NOTE — Telephone Encounter (Signed)
I called and left this information on the patient's home voice mail. Will fax the referral and the facility will call the patient to schedule an appointment.

## 2019-11-07 NOTE — Telephone Encounter (Signed)
(  Please note, this message was from Friday afternoon)  Please advise.

## 2019-11-07 NOTE — Telephone Encounter (Signed)
Usually the pain is better by now, but sometimes it takes more than a week.  Could certainly see her in clinic this week if she'd like, especially if she has fever.  Or could refer her to PT in [place] for treatment.

## 2019-11-07 NOTE — Telephone Encounter (Signed)
I called and advised the patient. The swelling has gone down a lot over the weekend. She said she is not really good with PT but would try some exercises, if some could be sent to her in the mail.

## 2019-11-07 NOTE — Telephone Encounter (Signed)
Rx written for [facility] in [place] (please fax).  She can go once or twice and they can teach her what to do and make sure she does it properly.

## 2019-11-08 ENCOUNTER — Other Ambulatory Visit (HOSPITAL_COMMUNITY): Payer: PPO

## 2019-11-08 ENCOUNTER — Ambulatory Visit (HOSPITAL_COMMUNITY): Payer: PPO

## 2019-11-10 DIAGNOSIS — I10 Essential (primary) hypertension: Secondary | ICD-10-CM | POA: Diagnosis not present

## 2019-11-10 DIAGNOSIS — R7301 Impaired fasting glucose: Secondary | ICD-10-CM | POA: Diagnosis not present

## 2019-11-10 DIAGNOSIS — E785 Hyperlipidemia, unspecified: Secondary | ICD-10-CM | POA: Diagnosis not present

## 2019-11-21 DIAGNOSIS — M130 Polyarthritis, unspecified: Secondary | ICD-10-CM | POA: Diagnosis not present

## 2019-11-21 DIAGNOSIS — E781 Pure hyperglyceridemia: Secondary | ICD-10-CM | POA: Diagnosis not present

## 2019-11-21 DIAGNOSIS — Z0001 Encounter for general adult medical examination with abnormal findings: Secondary | ICD-10-CM | POA: Diagnosis not present

## 2019-11-21 DIAGNOSIS — K5900 Constipation, unspecified: Secondary | ICD-10-CM | POA: Diagnosis not present

## 2019-11-21 DIAGNOSIS — R2241 Localized swelling, mass and lump, right lower limb: Secondary | ICD-10-CM | POA: Diagnosis not present

## 2019-11-21 DIAGNOSIS — R7301 Impaired fasting glucose: Secondary | ICD-10-CM | POA: Diagnosis not present

## 2019-11-21 DIAGNOSIS — I4891 Unspecified atrial fibrillation: Secondary | ICD-10-CM | POA: Diagnosis not present

## 2019-11-21 DIAGNOSIS — M79602 Pain in left arm: Secondary | ICD-10-CM | POA: Diagnosis not present

## 2019-11-21 DIAGNOSIS — J45909 Unspecified asthma, uncomplicated: Secondary | ICD-10-CM | POA: Diagnosis not present

## 2019-11-21 DIAGNOSIS — G9009 Other idiopathic peripheral autonomic neuropathy: Secondary | ICD-10-CM | POA: Diagnosis not present

## 2019-11-21 DIAGNOSIS — G4700 Insomnia, unspecified: Secondary | ICD-10-CM | POA: Diagnosis not present

## 2019-11-21 DIAGNOSIS — F419 Anxiety disorder, unspecified: Secondary | ICD-10-CM | POA: Diagnosis not present

## 2019-11-23 ENCOUNTER — Other Ambulatory Visit: Payer: Self-pay | Admitting: Student

## 2019-11-24 ENCOUNTER — Other Ambulatory Visit: Payer: Self-pay

## 2019-11-24 ENCOUNTER — Ambulatory Visit (HOSPITAL_COMMUNITY)
Admission: RE | Admit: 2019-11-24 | Discharge: 2019-11-24 | Disposition: A | Discharge status: Home / Self Care | Payer: PPO | Source: Ambulatory Visit | Attending: Interventional Radiology | Admitting: Interventional Radiology

## 2019-11-24 ENCOUNTER — Encounter (HOSPITAL_COMMUNITY): Payer: Self-pay

## 2019-11-24 DIAGNOSIS — Z79899 Other long term (current) drug therapy: Secondary | ICD-10-CM | POA: Diagnosis not present

## 2019-11-24 DIAGNOSIS — I10 Essential (primary) hypertension: Secondary | ICD-10-CM | POA: Diagnosis not present

## 2019-11-24 DIAGNOSIS — S22080S Wedge compression fracture of T11-T12 vertebra, sequela: Secondary | ICD-10-CM | POA: Insufficient documentation

## 2019-11-24 DIAGNOSIS — M4856XA Collapsed vertebra, not elsewhere classified, lumbar region, initial encounter for fracture: Secondary | ICD-10-CM | POA: Insufficient documentation

## 2019-11-24 DIAGNOSIS — S32010A Wedge compression fracture of first lumbar vertebra, initial encounter for closed fracture: Secondary | ICD-10-CM | POA: Diagnosis not present

## 2019-11-24 DIAGNOSIS — S22080A Wedge compression fracture of T11-T12 vertebra, initial encounter for closed fracture: Secondary | ICD-10-CM | POA: Diagnosis not present

## 2019-11-24 DIAGNOSIS — Z7982 Long term (current) use of aspirin: Secondary | ICD-10-CM | POA: Diagnosis not present

## 2019-11-24 DIAGNOSIS — J45909 Unspecified asthma, uncomplicated: Secondary | ICD-10-CM | POA: Diagnosis not present

## 2019-11-24 DIAGNOSIS — M4854XA Collapsed vertebra, not elsewhere classified, thoracic region, initial encounter for fracture: Secondary | ICD-10-CM | POA: Diagnosis not present

## 2019-11-24 DIAGNOSIS — Z87891 Personal history of nicotine dependence: Secondary | ICD-10-CM | POA: Insufficient documentation

## 2019-11-24 HISTORY — PX: IR KYPHO THORACIC WITH BONE BIOPSY: IMG5518

## 2019-11-24 HISTORY — PX: IR KYPHO LUMBAR INC FX REDUCE BONE BX UNI/BIL CANNULATION INC/IMAGING: IMG5519

## 2019-11-24 LAB — PROTIME-INR
INR: 1 (ref 0.8–1.2)
Prothrombin Time: 12.8 seconds (ref 11.4–15.2)

## 2019-11-24 LAB — CBC
HCT: 37.9 % (ref 36.0–46.0)
Hemoglobin: 12.5 g/dL (ref 12.0–15.0)
MCH: 32.8 pg (ref 26.0–34.0)
MCHC: 33 g/dL (ref 30.0–36.0)
MCV: 99.5 fL (ref 80.0–100.0)
Platelets: 172 10*3/uL (ref 150–400)
RBC: 3.81 MIL/uL — ABNORMAL LOW (ref 3.87–5.11)
RDW: 13.6 % (ref 11.5–15.5)
WBC: 6.8 10*3/uL (ref 4.0–10.5)
nRBC: 0 % (ref 0.0–0.2)

## 2019-11-24 MED ORDER — FENTANYL CITRATE (PF) 100 MCG/2ML IJ SOLN
INTRAMUSCULAR | Status: AC
  Filled 2019-11-24: qty 2

## 2019-11-24 MED ORDER — FENTANYL CITRATE (PF) 100 MCG/2ML IJ SOLN
INTRAMUSCULAR | Status: AC | PRN
Start: 2019-11-24 — End: 2019-11-24
  Administered 2019-11-24 (×4): 50 ug via INTRAVENOUS

## 2019-11-24 MED ORDER — MIDAZOLAM HCL 2 MG/2ML IJ SOLN
INTRAMUSCULAR | Status: AC
  Filled 2019-11-24: qty 4

## 2019-11-24 MED ORDER — SODIUM CHLORIDE 0.9 % IV SOLN: INTRAVENOUS | Status: DC

## 2019-11-24 MED ORDER — DIPHENHYDRAMINE HCL 50 MG/ML IJ SOLN
INTRAMUSCULAR | Status: AC | PRN
  Administered 2019-11-24: 25 mg via INTRAVENOUS

## 2019-11-24 MED ORDER — LIDOCAINE HCL (PF) 1 % IJ SOLN
INTRAMUSCULAR | Status: AC
  Filled 2019-11-24: qty 30

## 2019-11-24 MED ORDER — LIDOCAINE HCL (PF) 1 % IJ SOLN
INTRAMUSCULAR | Status: AC | PRN
  Administered 2019-11-24: 10 mL

## 2019-11-24 MED ORDER — CEFAZOLIN SODIUM-DEXTROSE 2-4 GM/100ML-% IV SOLN: 2.0000 g | INTRAVENOUS | Status: AC

## 2019-11-24 MED ORDER — MIDAZOLAM HCL 2 MG/2ML IJ SOLN
INTRAMUSCULAR | Status: AC
  Filled 2019-11-24: qty 2

## 2019-11-24 MED ORDER — DIPHENHYDRAMINE HCL 50 MG/ML IJ SOLN
INTRAMUSCULAR | Status: AC
  Filled 2019-11-24: qty 1

## 2019-11-24 MED ORDER — CEFAZOLIN SODIUM-DEXTROSE 2-4 GM/100ML-% IV SOLN
INTRAVENOUS | Status: AC
  Administered 2019-11-24: 2 g via INTRAVENOUS
  Filled 2019-11-24: qty 100

## 2019-11-24 MED ORDER — MIDAZOLAM HCL 2 MG/2ML IJ SOLN
INTRAMUSCULAR | Status: AC | PRN
  Administered 2019-11-24 (×5): 1 mg via INTRAVENOUS

## 2019-11-24 MED ORDER — IOHEXOL 300 MG/ML  SOLN
50.0000 mL | Freq: Once | INTRAMUSCULAR | Status: AC | PRN
  Administered 2019-11-24: 15 mL

## 2019-11-24 NOTE — Sedation Documentation (Signed)
O2 increased to 6l sats labile. 70% then up to 90% with chin lift.

## 2019-11-24 NOTE — Procedures (Signed)
Interventional Radiology Procedure Note  Procedure: W24 and K1 KP  Complications: None  Estimated Blood Loss: None  Recommendations: - Bedrest x3 hrs - DC home   Signed,  Criselda Peaches, MD

## 2019-11-24 NOTE — Sedation Documentation (Signed)
Pt responding and taking deep breaths on command. sats 92%

## 2019-11-24 NOTE — Discharge Instructions (Signed)
Kyphoplasty, Care After This sheet gives you information about how to care for yourself after your procedure. Your health care provider may also give you more specific instructions. If you have problems or questions, contact your health care provider. What can I expect after the procedure? After your procedure, it is common to have back pain. Follow these instructions at home: Medicines  Take over-the-counter and prescription medicines only as told by your health care provider.  Ask your health care provider if the medicine prescribed to you: ? Requires you to avoid driving or using heavy machinery. ? Can cause constipation. You may need to take steps to prevent or treat constipation, such as:  Drink enough fluid to keep your urine pale yellow.  Take over-the-counter or prescription medicines.  Eat foods that are high in fiber, such as beans, whole grains, and fresh fruits and vegetables.  Limit foods that are high in fat and processed sugars, such as fried or sweet foods. Puncture site care   Follow instructions from your health care provider about how to take care of your puncture site. Make sure you: ? Wash your hands with soap and water before and after you change your bandage (dressing). If soap and water are not available, use hand sanitizer. ? Change your dressing as told by your health care provider. ? Leave skin glue or adhesive strips in place. These skin closures may need to be in place for 2 weeks or longer. If adhesive strip edges start to loosen and curl up, you may trim the loose edges. Do not remove adhesive strips completely unless your health care provider tells you to do that.  Check your puncture site every day for signs of infection. Watch for: ? Redness, swelling, or pain. ? Fluid or blood. ? Warmth. ? Pus or a bad smell.  Keep your dressing dry until your health care provider says that it can be removed. Managing pain, stiffness, and swelling   If  directed, put ice on the painful area. ? Put ice in a plastic bag. ? Place a towel between your skin and the bag. ? Leave the ice on for 20 minutes, 2-3 times a day. Activity  Rest your back and avoid intense physical activity for as long as told by your health care provider.  Avoid bending, lifting, or twisting your back for as long as told by your health care provider.  Return to your normal activities as told by your health care provider. Ask your health care provider what activities are safe for you.  Do not lift anything that is heavier than 5 lb (2.2 kg). You may need to avoid heavy lifting for several weeks. General instructions  Do not use any products that contain nicotine or tobacco, such as cigarettes, e-cigarettes, and chewing tobacco. These can delay bone healing. If you need help quitting, ask your health care provider.  Do not drive for 24 hours if you were given a sedative during your procedure.  Keep all follow-up visits as told by your health care provider. This is important. Contact a health care provider if:  You have a fever or chills.  You have redness, swelling, or pain at the site of your puncture.  You have fluid, blood, or pus coming from the puncture site.  You have pain that gets worse or does not get better with medicine.  You develop numbness or weakness in any part of your body. Get help right away if:  You have chest pain.  You  have difficulty breathing.  You have weakness, numbness, or tingling in your legs.  You cannot control your bladder or bowel movements.  You suddenly become weak or numb on one side of your body.  You become very confused.  You have trouble speaking or understanding, or both. Summary  Follow instructions from your health care provider about how to take care of your puncture site.  Take over-the-counter and prescription medicines only as told by your health care provider.  Rest your back and avoid intense  physical activity for as long as told by your health care provider.  Contact a health care provider if you have pain that gets worse or does not get better with medicine.  Keep all follow-up visits as told by your health care provider. This is important. This information is not intended to replace advice given to you by your health care provider. Make sure you discuss any questions you have with your health care provider. Document Revised: 12/21/2017 Document Reviewed: 12/21/2017 Elsevier Patient Education  [employer].     Moderate Conscious Sedation, Adult, Care After These instructions provide you with information about caring for yourself after your procedure. Your health care provider may also give you more specific instructions. Your treatment has been planned according to current medical practices, but problems sometimes occur. Call your health care provider if you have any problems or questions after your procedure. What can I expect after the procedure? After your procedure, it is common:  To feel sleepy for several hours.  To feel clumsy and have poor balance for several hours.  To have poor judgment for several hours.  To vomit if you eat too soon. Follow these instructions at home: For at least 24 hours after the procedure:   Do not: ? Participate in activities where you could fall or become injured. ? Drive. ? Use heavy machinery. ? Drink alcohol. ? Take sleeping pills or medicines that cause drowsiness. ? Make important decisions or sign legal documents. ? Take care of children on your own.  Rest. Eating and drinking  Follow the diet recommended by your health care provider.  If you vomit: ? Drink water, juice, or soup when you can drink without vomiting. ? Make sure you have little or no nausea before eating solid foods. General instructions  Have a responsible adult stay with you until you are awake and alert.  Take over-the-counter and  prescription medicines only as told by your health care provider.  If you smoke, do not smoke without supervision.  Keep all follow-up visits as told by your health care provider. This is important. Contact a health care provider if:  You keep feeling nauseous or you keep vomiting.  You feel light-headed.  You develop a rash.  You have a fever. Get help right away if:  You have trouble breathing. This information is not intended to replace advice given to you by your health care provider. Make sure you discuss any questions you have with your health care provider. Document Revised: 12/26/2016 Document Reviewed: 05/05/2015 Elsevier Patient Education  2020 [employer].

## 2019-11-24 NOTE — H&P (Signed)
Chief Complaint: Patient was seen in consultation today for T12 and L1 compression fractures/veterbal augmentation.  Referring Physician(s): Blackman,Christopher (orthopedics)  Supervising Physician: Jacqulynn Cadet  Patient Status: [facility] - Out-pt  History of Present Illness: Zoe [name] is a 84 y.o. female with a past medical history of hypertension, hypercholesteremia, GERD, asthma, fibromyalgia, anxiety, and depression. She is known to IR- she underwent a T11 kyphoplasty with Dr. Vernard Gambles 08/23/2019. Unfortunately, following procedure, patient developed a significant exacerbation of low back pain. She went to orthopedics for further management and was found to have a compression fracture (T12). She was referred back to IR for further management, and met with Dr. Laurence Ferrari 10/18/2019 to discuss management options. On PE, patient also had lower lumbar spine tenderness. Additional MR was obtained, also revealing a L1 compression fracture.  MR thoracic spine 10/13/2019: 1. Compared with the recent MRI of 07/22/2019, there are new fractures involving T7, T12, and L1. 2. Chronic fracture T6 unchanged 3. Healing fracture T11 unchanged with interval kyphoplasty on the right. 4. No significant spinal stenosis or cord compression. 5. Right foraminal encroachment due to spurring at T9-10 unchanged.  MR lumbar spine 10/20/2019: 1. Interval development of acute superior endplate fractures of I34 and L1 with bone marrow edema and enhancement. 2. Chronic fracture L4 unchanged 3. Multilevel degenerative change. There is multilevel neural impingement in the foramen bilaterally similar to the prior study. Posterior synovial cyst at L3-4 unchanged.  Patient presents today for possible image-guided T12 and L1 kyphoplasty/vertebroplasty. Patient awake and alert laying in bed. Complains of low back pain, currently 3-4/10. States standing exacerbates pain, rating back pain as 10/10 at its  worst. Denies fever, chills, chest pain, dyspnea, abdominal pain, or headache.   Past Medical History:  Diagnosis Date  . Abdominal discomfort 11-02-12   suspected ?UTI- PCP MD placed on Cipro  . Anxiety   . Arthritis   . Asthma   . Cancer ([place])    skin CA  . Chronic constipation   . Constipation   . Cough   . Depression   . Dysrhythmia    tachycardia  . Environmental allergies   . Fibromyalgia   . GERD (gastroesophageal reflux disease)   . Hemorrhoids   . High cholesterol   . Hypercholesteremia   . Hypertension   . Nausea   . Pneumonia    7+ YRS  . Urgency of urination     Past Surgical History:  Procedure Laterality Date  . ANAL RECTAL MANOMETRY N/A 11/28/2013   Procedure: ANO RECTAL MANOMETRY;  Surgeon: Leighton Ruff, MD;  Location: [facility];  Service: Endoscopy;  Laterality: N/A;  . APPENDECTOMY    . BACK SURGERY     X2   . CHOLECYSTECTOMY    . COLONOSCOPY  03/29/03  . COLONOSCOPY N/A 12/09/2013   Procedure: COLONOSCOPY;  Surgeon: Rogene Houston, MD;  Location: [facility];  Service: Endoscopy;  Laterality: N/A;  730  . COLONOSCOPY N/A 02/04/2017   Procedure: COLONOSCOPY;  Surgeon: Rogene Houston, MD;  Location: [facility];  Service: Endoscopy;  Laterality: N/A;  930  . EYE SURGERY     bilateral cataract removal  . IR KYPHO THORACIC WITH BONE BIOPSY  08/23/2019  . IR RADIOLOGIST EVAL & MGMT  08/03/2019  . IR RADIOLOGIST EVAL & MGMT  09/29/2019  . IR RADIOLOGIST EVAL & MGMT  10/18/2019  . SKIN CANCER EXCISION     RIGHT NECK    . TONSILLECTOMY  T+A  . TOTAL HIP ARTHROPLASTY Right 09/14/2012   Procedure: RIGHT TOTAL HIP ARTHROPLASTY ANTERIOR APPROACH and RIGHT KNEE STEROID INJECTION;  Surgeon: Mcarthur Rossetti, MD;  Location: [facility];  Service: Orthopedics;  Laterality: Right;  . TOTAL HIP ARTHROPLASTY Left 11/05/2012   Procedure: LEFT TOTAL HIP ARTHROPLASTY ANTERIOR APPROACH;  Surgeon: Mcarthur Rossetti, MD;  Location: [facility];  Service:  Orthopedics;  Laterality: Left;  . UMBILICAL HERNIA REPAIR  04/26/03   JENKINS  . UPPER GASTROINTESTINAL ENDOSCOPY  08/03/2009   NUR  . UPPER GASTROINTESTINAL ENDOSCOPY  03/29/2003   EGD ED TCS    Allergies: Codeine, Escitalopram, Other, Oxycodone, Paxil [paroxetine hcl], and Procaine  Medications: Prior to Admission medications   Medication Sig Start Date End Date Taking? Authorizing Provider  albuterol (PROVENTIL HFA;VENTOLIN HFA) 108 (90 BASE) MCG/ACT inhaler Inhale 2 puffs into the lungs every 6 (six) hours as needed for wheezing or shortness of breath.    [provider]  allopurinol (ZYLOPRIM) 300 MG tablet Take 300 mg by mouth daily. 03/10/11   [provider]  ALPRAZolam Duanne Moron) 0.5 MG tablet Take 0.5 mg by mouth 2 (two) times daily as needed for anxiety.     [provider]  amLODipine (NORVASC) 2.5 MG tablet Take 2.5 mg by mouth 3 (three) times a week. 08/04/19   [provider]  aspirin 81 MG tablet Take 81 mg by mouth daily.    [provider]  Biotin 5000 MCG CAPS Take 5,000 mcg by mouth daily.    [provider]  diclofenac Sodium (VOLTAREN) 1 % GEL APPLY 2-4 GRAMS TOPICALLY FOUR TIMES DAILY Patient not taking: Reported on 08/19/2019 01/13/19   Mcarthur Rossetti, MD  digoxin (LANOXIN) 0.125 MG tablet Take 125 mcg by mouth daily. 08/04/19   [provider]  doxepin (SINEQUAN) 100 MG capsule Take 100 mg by mouth at bedtime.     [provider]  fexofenadine (ALLEGRA) 60 MG tablet Take 60 mg by mouth daily.     [provider]  gabapentin (NEURONTIN) 300 MG capsule Take 1 capsule (300 mg total) by mouth 3 (three) times daily. Patient taking differently: Take 300 mg by mouth 2 (two) times daily.  05/19/18   Mcarthur Rossetti, MD  ipratropium-albuterol (DUONEB) 0.5-2.5 (3) MG/3ML SOLN Inhale 3 mLs into the lungs every 4 (four) hours as needed (SOB).  10/12/18   [provider]   lubiprostone (AMITIZA) 24 MCG capsule Take 24 mcg by mouth 2 (two) times daily with a meal.    [provider]  methocarbamol (ROBAXIN) 500 MG tablet Take 1 tablet (500 mg total) by mouth every 6 (six) hours as needed for muscle spasms. 08/29/19   Mcarthur Rossetti, MD  Multiple Vitamin (MULTIVITAMIN) tablet Take 1 tablet by mouth daily.    [provider]  naproxen (NAPROSYN) 500 MG tablet Take 500 mg by mouth 2 (two) times daily with a meal.     [provider]  omeprazole (PRILOSEC) 20 MG capsule Take 20 mg by mouth every morning.    [provider]  Plecanatide (TRULANCE) 3 MG TABS Take 3 mg by mouth daily with breakfast. Patient not taking: Reported on 08/19/2019 06/21/19   Rogene Houston, MD  pravastatin (PRAVACHOL) 20 MG tablet Take 20 mg by mouth every other day.     [provider]  senna (SENOKOT) 8.6 MG tablet Take 5 tablets (43 mg total) by mouth daily. Patient taking differently: Take 5  tablets by mouth 2 (two) times daily.  06/21/19   Rogene Houston, MD     Family History  Problem Relation Age of Onset  . Healthy Son   . Healthy Son   . Healthy Daughter   . Colon cancer Neg Hx     Social History   Socioeconomic History  . Marital status: Married    Spouse name: Not on file  . Number of children: Not on file  . Years of education: Not on file  . Highest education level: Not on file  Occupational History  . Not on file  Tobacco Use  . Smoking status: Former Smoker    Packs/day: 0.25    Years: 50.00    Pack years: 12.50    Types: Cigarettes    Quit date: 11/10/2018    Years since quitting: 1.0  . Smokeless tobacco: Never Used  . Tobacco comment: Hasn't smoked in 5-6 weeks  Vaping Use  . Vaping Use: Never used  Substance and Sexual Activity  . Alcohol use: Yes    Alcohol/week: 0.0 standard drinks    Comment: rare  . Drug use: No  . Sexual activity: Not Currently    Birth control/protection: Post-menopausal   Other Topics Concern  . Not on file  Social History Narrative  . Not on file   Social Determinants of Health   Financial Resource Strain:   . Difficulty of Paying Living Expenses: Not on file  Food Insecurity:   . Worried About Charity fundraiser in the Last Year: Not on file  . Ran Out of Food in the Last Year: Not on file  Transportation Needs:   . Lack of Transportation (Medical): Not on file  . Lack of Transportation (Non-Medical): Not on file  Physical Activity:   . Days of Exercise per Week: Not on file  . Minutes of Exercise per Session: Not on file  Stress:   . Feeling of Stress : Not on file  Social Connections:   . Frequency of Communication with Friends and Family: Not on file  . Frequency of Social Gatherings with Friends and Family: Not on file  . Attends Religious Services: Not on file  . Active Member of Clubs or Organizations: Not on file  . Attends Archivist Meetings: Not on file  . Marital Status: Not on file     Review of Systems: A 12 point ROS discussed and pertinent positives are indicated in the HPI above.  All other systems are negative.  Review of Systems  Constitutional: Negative for chills and fever.  Respiratory: Negative for shortness of breath and wheezing.   Cardiovascular: Negative for chest pain and palpitations.  Gastrointestinal: Negative for abdominal pain.  Neurological: Negative for headaches.  Psychiatric/Behavioral: Negative for behavioral problems and confusion.    Vital Signs: BP 136/82   Pulse 70   Temp 98.3 F (36.8 C) (Oral)   Resp 16   Ht 5' 7.5" (1.715 m)   Wt 171 lb (77.6 kg)   SpO2 98%   BMI 26.39 kg/m   Physical Exam Vitals and nursing note reviewed.  Constitutional:      General: She is not in acute distress.    Appearance: Normal appearance.  Cardiovascular:     Rate and Rhythm: Normal rate and regular rhythm.     Heart sounds: Normal heart sounds. No murmur heard.   Pulmonary:     Effort:  Pulmonary effort is normal. No respiratory distress.  Breath sounds: Normal breath sounds. No wheezing.  Musculoskeletal:     Comments: Moderate-severe tenderness of midline lower back.  Skin:    General: Skin is warm and dry.  Neurological:     Mental Status: She is alert and oriented to person, place, and time.  Psychiatric:        Mood and Affect: Mood normal.        Behavior: Behavior normal.      MD Evaluation Airway: WNL Heart: WNL Abdomen: WNL Chest/ Lungs: WNL ASA  Classification: 3 Mallampati/Airway Score: Two   Imaging: US Guided Needle Placement - No Linked Charges  Result Date: 11/01/2019 Ultrasound-guided bilateral glenohumeral injection: After sterile prep with Betadine, injected 8 cc 1% lidocaine without epinephrine and 40 mg methylprednisolone using a 22-gauge spinal needle, passing the needle from posterior approach into the glenohumeral joint.  Injectate seen filling both joint capsules.     Labs:  CBC: Recent Labs    08/23/19 0805 11/24/19 1025  WBC 6.6 6.8  HGB 12.9 12.5  HCT 39.9 37.9  PLT 186 172    COAGS: Recent Labs    08/23/19 0805  INR 1.0    BMP: Recent Labs    08/23/19 0805  NA 141  K 3.9  CL 106  CO2 23  GLUCOSE 121*  BUN 15  CALCIUM 9.6  CREATININE 0.99  GFRNONAA 53*  GFRAA >60    LIVER FUNCTION TESTS: No results for input(s): BILITOT, AST, ALT, ALKPHOS, PROT, ALBUMIN in the last 8760 hours.   Assessment and Plan:  T12 and L1 compression fractures. Plan for image-guided T12 and L1 kyphoplasty/vertebroplasty today in IR. Patient is NPO. Afebrile. She does not take blood thinners. INR pending.  Risks and benefits of T12 and L1 kyphoplasty/vertebroplasty were discussed with the patient including, but not limited to education regarding the natural healing process of compression fractures without intervention, bleeding, infection, cement migration which may cause spinal cord damage, paralysis, pulmonary  embolism or even death. This interventional procedure involves the use of X-rays and because of the nature of the planned procedure, it is possible that we will have prolonged use of X-ray fluoroscopy. Potential radiation risks to you include (but are not limited to) the following: - A slightly elevated risk for cancer  several years later in life. This risk is typically less than 0.5% percent. This risk is low in comparison to the normal incidence of human cancer, which is 33% for women and 50% for men according to the [employer]. - Radiation induced injury can include skin redness, resembling a rash, tissue breakdown / ulcers and hair loss (which can be temporary or permanent).  The likelihood of either of these occurring depends on the difficulty of the procedure and whether you are sensitive to radiation due to previous procedures, disease, or genetic conditions.  IF your procedure requires a prolonged use of radiation, you will be notified and given written instructions for further action.  It is your responsibility to monitor the irradiated area for the 2 weeks following the procedure and to notify your physician if you are concerned that you have suffered a radiation induced injury.   All of the patient's questions were answered, patient is agreeable to proceed. Consent signed and in chart.   Thank you for this interesting consult.  I greatly enjoyed meeting Zoe [name] and look forward to participating in their care.  A copy of this report was sent to the requesting provider on this date.  Electronically Signed: Earley Abide, PA-C 11/24/2019, 10:50 AM   I spent a total of 25 Minutes in face to face in clinical consultation, greater than 50% of which was counseling/coordinating care for T12 and L1 compression fractures/veterbal augmentation.

## 2019-11-29 ENCOUNTER — Other Ambulatory Visit: Payer: Self-pay | Admitting: Interventional Radiology

## 2019-11-29 DIAGNOSIS — M5442 Lumbago with sciatica, left side: Secondary | ICD-10-CM

## 2019-11-29 DIAGNOSIS — M546 Pain in thoracic spine: Secondary | ICD-10-CM

## 2019-12-07 DIAGNOSIS — H6123 Impacted cerumen, bilateral: Secondary | ICD-10-CM | POA: Diagnosis not present

## 2019-12-09 ENCOUNTER — Other Ambulatory Visit: Payer: Self-pay

## 2019-12-09 ENCOUNTER — Ambulatory Visit (HOSPITAL_COMMUNITY)
Admission: RE | Admit: 2019-12-09 | Discharge: 2019-12-09 | Disposition: A | Discharge status: Home / Self Care | Payer: PPO | Source: Ambulatory Visit | Attending: Interventional Radiology | Admitting: Interventional Radiology

## 2019-12-09 DIAGNOSIS — G8929 Other chronic pain: Secondary | ICD-10-CM | POA: Diagnosis not present

## 2019-12-09 DIAGNOSIS — M546 Pain in thoracic spine: Secondary | ICD-10-CM | POA: Diagnosis not present

## 2019-12-09 DIAGNOSIS — M5442 Lumbago with sciatica, left side: Secondary | ICD-10-CM | POA: Diagnosis not present

## 2019-12-09 DIAGNOSIS — M5126 Other intervertebral disc displacement, lumbar region: Secondary | ICD-10-CM | POA: Diagnosis not present

## 2019-12-09 DIAGNOSIS — M5124 Other intervertebral disc displacement, thoracic region: Secondary | ICD-10-CM | POA: Diagnosis not present

## 2019-12-09 DIAGNOSIS — M545 Low back pain, unspecified: Secondary | ICD-10-CM | POA: Diagnosis not present

## 2019-12-09 DIAGNOSIS — M4319 Spondylolisthesis, multiple sites in spine: Secondary | ICD-10-CM | POA: Diagnosis not present

## 2019-12-09 DIAGNOSIS — M48061 Spinal stenosis, lumbar region without neurogenic claudication: Secondary | ICD-10-CM | POA: Diagnosis not present

## 2019-12-12 ENCOUNTER — Other Ambulatory Visit: Payer: Self-pay

## 2019-12-12 DIAGNOSIS — E7849 Other hyperlipidemia: Secondary | ICD-10-CM

## 2019-12-12 DIAGNOSIS — I10 Essential (primary) hypertension: Secondary | ICD-10-CM

## 2019-12-12 DIAGNOSIS — I499 Cardiac arrhythmia, unspecified: Secondary | ICD-10-CM

## 2019-12-12 DIAGNOSIS — K219 Gastro-esophageal reflux disease without esophagitis: Secondary | ICD-10-CM

## 2019-12-14 ENCOUNTER — Ambulatory Visit: Payer: PPO | Admitting: Cardiology

## 2019-12-26 ENCOUNTER — Telehealth: Payer: Self-pay | Admitting: Physical Medicine and Rehabilitation

## 2019-12-26 NOTE — Telephone Encounter (Signed)
Pt would like to be called to schedule an apt with Dr. Ernestina Patches for an injection

## 2019-12-26 NOTE — Telephone Encounter (Signed)
L4-5 interlam esi - confirm not Bt's, I did not see any

## 2019-12-26 NOTE — Telephone Encounter (Signed)
Pt called would like to sch another inj. Lasted inj on 2/10.

## 2019-12-27 ENCOUNTER — Ambulatory Visit (INDEPENDENT_AMBULATORY_CARE_PROVIDER_SITE_OTHER): Payer: PPO | Admitting: Internal Medicine

## 2019-12-27 ENCOUNTER — Other Ambulatory Visit: Payer: Self-pay

## 2019-12-27 ENCOUNTER — Encounter (INDEPENDENT_AMBULATORY_CARE_PROVIDER_SITE_OTHER): Payer: Self-pay | Admitting: Internal Medicine

## 2019-12-27 ENCOUNTER — Other Ambulatory Visit (INDEPENDENT_AMBULATORY_CARE_PROVIDER_SITE_OTHER): Payer: Self-pay | Admitting: Orthopaedic Surgery

## 2019-12-27 VITALS — BP 113/71 | HR 85 | Temp 98.1°F | Ht 67.5 in | Wt 173.3 lb

## 2019-12-27 DIAGNOSIS — R1319 Other dysphagia: Secondary | ICD-10-CM

## 2019-12-27 DIAGNOSIS — K5909 Other constipation: Secondary | ICD-10-CM

## 2019-12-27 DIAGNOSIS — K219 Gastro-esophageal reflux disease without esophagitis: Secondary | ICD-10-CM

## 2019-12-27 MED ORDER — OMEPRAZOLE 40 MG PO CPDR
40.0000 mg | DELAYED_RELEASE_CAPSULE | Freq: Every day | ORAL | 5 refills | Status: DC
Start: 2019-12-27 — End: 2020-02-01

## 2019-12-27 NOTE — Telephone Encounter (Signed)
Called pt and lvm #1 

## 2019-12-27 NOTE — Telephone Encounter (Signed)
Called pt and sch 1/3.

## 2019-12-27 NOTE — Patient Instructions (Signed)
Physician will call with results of GI study when completed

## 2019-12-27 NOTE — Progress Notes (Signed)
Presenting complaint;  Follow for chronic constipation and GERD.  Database and subjective:  Patient is 84 year old Caucasian female with lifelong constipation with dependence on stimulant laxative at a high dose who has failed most therapies but seem to be getting some benefit by using Amitiza/lubiprostone in addition to senna. She is here for scheduled visit.  She was last seen 6 months ago. She states she generally has 3-4 bowel movements per week.  She generally sense of complete evacuation.  She denies abdominal pain melena or rectal bleeding.  She says heartburn is well controlled with therapy.  She is still having dysphagia she has one or two episodes per month.  She has had sporadic food impaction relieved with regurgitation.  She does not feel her dysphagia is bad enough for her esophagus to be dilated.  Last dilation was about 16 years ago. She complains of intermittent hoarseness.  She also complains of throat pain on swallowing.  She was seen by Dr. Festus Holts 1 month ago and no abnormality was noted. Her appetite is good.  She has lost 8 pounds since her last visit and she is quite pleased. She is using Voltaren gel for bilateral shoulder as well as back pain.  She is also on naproxen but usually takes 5 mg daily.  Current Medications: Outpatient Encounter Medications as of 12/27/2019  Medication Sig  . ADVAIR DISKUS 250-50 MCG/DOSE AEPB Inhale 1 puff into the lungs 2 (two) times daily.  Marland Kitchen albuterol (PROVENTIL HFA;VENTOLIN HFA) 108 (90 BASE) MCG/ACT inhaler Inhale 2 puffs into the lungs every 6 (six) hours as needed for wheezing or shortness of breath.  . allopurinol (ZYLOPRIM) 300 MG tablet Take 300 mg by mouth daily.  Marland Kitchen ALPRAZolam (XANAX) 0.5 MG tablet Take 0.5 mg by mouth 2 (two) times daily as needed for anxiety.   Marland Kitchen aspirin 81 MG tablet Take 81 mg by mouth daily.  . Biotin 5000 MCG CAPS Take 5,000 mcg by mouth daily.  . calcium-vitamin D 250-100 MG-UNIT tablet Take 1 tablet by mouth  2 (two) times daily. Started 2 days ago  . diclofenac Sodium (VOLTAREN) 1 % GEL APPLY 2-4 GRAMS TOPICALLY FOUR TIMES DAILY  . digoxin (LANOXIN) 0.125 MG tablet Take 125 mcg by mouth daily.  Marland Kitchen doxepin (SINEQUAN) 100 MG capsule Take 100 mg by mouth at bedtime.   . fexofenadine (ALLEGRA) 60 MG tablet Take 60 mg by mouth daily.   Marland Kitchen gabapentin (NEURONTIN) 300 MG capsule Take 1 capsule (300 mg total) by mouth 3 (three) times daily. (Patient taking differently: Take 300 mg by mouth 2 (two) times daily. )  . ipratropium-albuterol (DUONEB) 0.5-2.5 (3) MG/3ML SOLN Inhale 3 mLs into the lungs every 4 (four) hours as needed (SOB).   Marland Kitchen lubiprostone (AMITIZA) 24 MCG capsule Take 24 mcg by mouth 2 (two) times daily with a meal.  . Multiple Vitamin (MULTIVITAMIN) tablet Take 1 tablet by mouth daily.  . naproxen (NAPROSYN) 500 MG tablet Take 500 mg by mouth 2 (two) times daily with a meal.   . omeprazole (PRILOSEC) 20 MG capsule Take 20 mg by mouth every morning.  . pravastatin (PRAVACHOL) 20 MG tablet Take 20 mg by mouth every other day.   . senna (SENOKOT) 8.6 MG tablet Take 5 tablets (43 mg total) by mouth daily. (Patient taking differently: Take 5 tablets by mouth 2 (two) times daily. )  . amLODipine (NORVASC) 2.5 MG tablet Take 2.5 mg by mouth 3 (three) times a week. (Patient not taking: Reported on  12/27/2019)  . methocarbamol (ROBAXIN) 500 MG tablet Take 1 tablet (500 mg total) by mouth every 6 (six) hours as needed for muscle spasms.  . Plecanatide (TRULANCE) 3 MG TABS Take 3 mg by mouth daily with breakfast. (Patient not taking: Reported on 08/19/2019)   No facility-administered encounter medications on file as of 12/27/2019.     Objective: Blood pressure 113/71, pulse 85, temperature 98.1 F (36.7 C), temperature source Oral, height 5' 7.5" (1.715 m), weight 173 lb 4.8 oz (78.6 kg). Patient is alert and in no acute distress. She is wearing a mask. Conjunctiva is pink. Sclera is  nonicteric Oropharyngeal mucosa is normal. No neck masses or thyromegaly noted. Cardiac exam with regular rhythm normal S1 and S2. No murmur or gallop noted. Lungs are clear to auscultation. Abdomen is full.  She has right upper paramedian scar.  Bowel sounds are normal.  On palpation abdomen is soft and nontender with organomegaly or masses. No LE edema or clubbing noted.  Labs/studies Results:  CBC Latest Ref Rng & Units 11/24/2019 08/23/2019 10/04/2014  WBC 4.0 - 10.5 K/uL 6.8 6.6 6.2  Hemoglobin 12.0 - 15.0 g/dL 12.5 12.9 14.9  Hematocrit 36 - 46 % 37.9 39.9 43.4  Platelets 150 - 400 K/uL 172 186 150    CMP Latest Ref Rng & Units 08/23/2019 09/22/2017 10/04/2014  Glucose 70 - 99 mg/dL 121(H) - 137(H)  BUN 8 - 23 mg/dL 15 - 22(H)  Creatinine 0.44 - 1.00 mg/dL 0.99 0.90 0.77  Sodium 135 - 145 mmol/L 141 - 139  Potassium 3.5 - 5.1 mmol/L 3.9 - 3.9  Chloride 98 - 111 mmol/L 106 - 106  CO2 22 - 32 mmol/L 23 - 23  Calcium 8.9 - 10.3 mg/dL 9.6 - 8.7(L)  Total Protein 6.5 - 8.1 g/dL - - 7.1  Total Bilirubin 0.3 - 1.2 mg/dL - - 0.9  Alkaline Phos 38 - 126 U/L - - 50  AST 15 - 41 U/L - - 25  ALT 14 - 54 U/L - - 17    Hepatic Function Latest Ref Rng & Units 10/04/2014 03/16/2013 03/02/2013  Total Protein 6.5 - 8.1 g/dL 7.1 7.7 7.5  Albumin 3.5 - 5.0 g/dL 4.1 4.0 3.9  AST 15 - 41 U/L _0 ALT 14 - 54 U/L _1 Alk Phosphatase 38 - 126 U/L 50 80 72  Total Bilirubin 0.3 - 1.2 mg/dL 0.9 0.8 0.8  Bilirubin, Direct 0.0 - 0.3 mg/dL - 0.1 -     Assessment:  #1.  Chronic constipation.  She is laxative dependent.  Even high-dose senna has stopped working.  She is not on combination of senna and Amitiza doing reasonably well.  Prior attempts at weaning off senna have failed.  Surgical consultation few years ago for subtotal colectomy was felt to be too risky.  #2.  Chronic GERD.  Heartburn is well controlled with low-dose PPI however she remains with throat symptoms.  Recent ENT evaluation  was negative.  Will increase omeprazole dose for 8 weeks to see and see what happens.  #3.  Esophageal dysphagia.  Suspect motility disorder.  Need to make sure she does not have structural abnormality which may lead to ER visit.   Plan:  Increase omeprazole to 40 mg p.o. every morning.  New prescription sent to patient's pharmacy. Barium pill esophagogram. Office visit in 6 months.

## 2019-12-30 ENCOUNTER — Other Ambulatory Visit: Payer: Self-pay

## 2019-12-30 ENCOUNTER — Ambulatory Visit (HOSPITAL_COMMUNITY)
Admission: RE | Admit: 2019-12-30 | Discharge: 2019-12-30 | Disposition: A | Discharge status: Home / Self Care | Payer: PPO | Source: Ambulatory Visit | Attending: Internal Medicine | Admitting: Internal Medicine

## 2019-12-30 DIAGNOSIS — K224 Dyskinesia of esophagus: Secondary | ICD-10-CM | POA: Diagnosis not present

## 2019-12-30 DIAGNOSIS — R1319 Other dysphagia: Secondary | ICD-10-CM | POA: Insufficient documentation

## 2019-12-30 DIAGNOSIS — R1310 Dysphagia, unspecified: Secondary | ICD-10-CM | POA: Diagnosis not present

## 2020-01-04 ENCOUNTER — Telehealth: Payer: Self-pay | Admitting: Physical Medicine and Rehabilitation

## 2020-01-04 NOTE — Telephone Encounter (Signed)
Rescheduled

## 2020-01-04 NOTE — Telephone Encounter (Signed)
Patient called back advised she can take the appointment tomorrow at 2:45pm. The number to contact patient is [phone]

## 2020-01-10 ENCOUNTER — Encounter: Payer: Self-pay | Admitting: Nurse Practitioner

## 2020-01-10 ENCOUNTER — Ambulatory Visit (INDEPENDENT_AMBULATORY_CARE_PROVIDER_SITE_OTHER): Payer: PPO | Admitting: Nurse Practitioner

## 2020-01-10 ENCOUNTER — Other Ambulatory Visit: Payer: Self-pay

## 2020-01-10 VITALS — BP 104/79 | HR 60 | Resp 18 | Ht 67.5 in

## 2020-01-10 DIAGNOSIS — R110 Nausea: Secondary | ICD-10-CM | POA: Diagnosis not present

## 2020-01-10 DIAGNOSIS — R109 Unspecified abdominal pain: Secondary | ICD-10-CM | POA: Insufficient documentation

## 2020-01-10 DIAGNOSIS — R1084 Generalized abdominal pain: Secondary | ICD-10-CM

## 2020-01-10 MED ORDER — ONDANSETRON 8 MG PO TBDP: 8.0000 mg | ORAL_TABLET | Freq: Three times a day (TID) | ORAL | 0 refills | Status: DC | PRN

## 2020-01-10 MED ORDER — PROMETHAZINE HCL 25 MG/ML IJ SOLN
25.0000 mg | Freq: Once | INTRAMUSCULAR | Status: AC
  Administered 2020-01-10: 25 mg via INTRAMUSCULAR

## 2020-01-10 NOTE — Assessment & Plan Note (Addendum)
-  reviewed notes from Dr. Laural Golden -pain is worse at epigastric and periumbilical palpation sites, but is tender generally -EKG performed d/t fatigue and SOB, but this showed NSR -she has extensive history of being laxative dependent -exam did not show mass, so not likely to have massive stool burden -distension relieved by belching -ordered CT abdomen -Rx. zofran -will check CBC and CMP; if WBC elevated will consider adding abx -we discussed that if her pain or symptoms get worse, she should go to the ED for a full work-up and STAT imaging

## 2020-01-10 NOTE — Progress Notes (Signed)
 Acute Office Visit  Subjective:    Patient ID: Zoe [name], female    DOB: 12/30/1935, 84 y.o.   MRN: [id number]  Chief Complaint  Patient presents with  . GI Problem    Pt sick to stomach has been having bloating diarrhea and constipation stomach has been swelling and getting hard she has been throwing up and having diarrhea and horrible burps started 2 weeks ago     HPI Patient is in today for nausea.  This has been ongoing for several weeks.  She states he abdomen has been getting larger and larger.  She started belching last night, and this rseovled her abdominal distention.  She has abdominal pain that she rates at 8/10.  She has been vomiting since 0200 this AM.  She states she has been on a liquid diet for several days.  She had had a few small liquid BMs, but no large BMs.  Her pain is periumbilical and epigastric.  She has hx of chronic constipation and is followed by Dr. Rehman.  She is laxative dependent and uses a combination of senna and amitiza.  She also has a hx of chronic GERD.  She had recent ENT eval, and that was negative.  Dr. Rehman, on 12/27/19, increased her omeprazole.   Past Medical History:  Diagnosis Date  . Abdominal discomfort 11-02-12   suspected ?UTI- PCP MD placed on Cipro  . Anxiety   . Arthritis   . Asthma   . Cancer (HCC)    skin CA  . Chronic constipation   . Constipation   . Cough   . Depression   . Dysrhythmia    tachycardia  . Environmental allergies   . Fibromyalgia   . GERD (gastroesophageal reflux disease)   . Hemorrhoids   . High cholesterol   . Hypercholesteremia   . Hypertension   . Nausea   . Pneumonia    7+ YRS  . Urgency of urination     Past Surgical History:  Procedure Laterality Date  . ANAL RECTAL MANOMETRY N/A 11/28/2013   Procedure: ANO RECTAL MANOMETRY;  Surgeon: Alicia Thomas, MD;  Location: [facility];  Service: Endoscopy;  Laterality: N/A;  . APPENDECTOMY    . BACK SURGERY     X2   .  CHOLECYSTECTOMY    . COLONOSCOPY  03/29/03  . COLONOSCOPY N/A 12/09/2013   Procedure: COLONOSCOPY;  Surgeon: Najeeb U Rehman, MD;  [facility];  Service: Endoscopy;  Laterality: N/A;  730  . COLONOSCOPY N/A 02/04/2017   Procedure: COLONOSCOPY;  Surgeon: Rehman, Najeeb U, MD;  [facility];  Service: Endoscopy;  Laterality: N/A;  930  . EYE SURGERY     bilateral cataract removal  . IR KYPHO LUMBAR INC FX REDUCE BONE BX UNI/BIL CANNULATION INC/IMAGING  11/24/2019  . IR KYPHO THORACIC WITH BONE BIOPSY  08/23/2019  . IR KYPHO THORACIC WITH BONE BIOPSY  11/24/2019  . IR RADIOLOGIST EVAL & MGMT  08/03/2019  . IR RADIOLOGIST EVAL & MGMT  09/29/2019  . IR RADIOLOGIST EVAL & MGMT  10/18/2019  . SKIN CANCER EXCISION     RIGHT NECK    . TONSILLECTOMY     T+A  . TOTAL HIP ARTHROPLASTY Right 09/14/2012   Procedure: RIGHT TOTAL HIP ARTHROPLASTY ANTERIOR APPROACH and RIGHT KNEE STEROID INJECTION;  Surgeon: Christopher Y Blackman, MD;  Location: [facility];  Service: Orthopedics;  Laterality: Right;  . TOTAL HIP ARTHROPLASTY Left 11/05/2012   Procedure: LEFT   TOTAL HIP ARTHROPLASTY ANTERIOR APPROACH;  Surgeon: Christopher Y Blackman, MD;  [facility];  Service: Orthopedics;  Laterality: Left;  . UMBILICAL HERNIA REPAIR  04/26/03   JENKINS  . UPPER GASTROINTESTINAL ENDOSCOPY  08/03/2009   NUR  . UPPER GASTROINTESTINAL ENDOSCOPY  03/29/2003   EGD ED TCS    Family History  Problem Relation Age of Onset  . Healthy Son   . Healthy Son   . Healthy Daughter   . Colon cancer Neg Hx     Social History   Socioeconomic History  . Marital status: Married    Spouse name: Not on file  . Number of children: Not on file  . Years of education: Not on file  . Highest education level: Not on file  Occupational History  . Not on file  Tobacco Use  . Smoking status: Former Smoker    Packs/day: 0.25    Years: 50.00    Pack years: 12.50    Types: Cigarettes    Quit date: 11/10/2018     Years since quitting: 1.1  . Smokeless tobacco: Never Used  . Tobacco comment: Hasn't smoked in 5-6 weeks  Vaping Use  . Vaping Use: Never used  Substance and Sexual Activity  . Alcohol use: Yes    Alcohol/week: 0.0 standard drinks    Comment: rare  . Drug use: No  . Sexual activity: Not Currently    Birth control/protection: Post-menopausal  Other Topics Concern  . Not on file  Social History Narrative  . Not on file   Social Determinants of Health   Financial Resource Strain: Not on file  Food Insecurity: Not on file  Transportation Needs: Not on file  Physical Activity: Not on file  Stress: Not on file  Social Connections: Not on file  Intimate Partner Violence: Not on file    Outpatient Medications Prior to Visit  Medication Sig Dispense Refill  . ADVAIR DISKUS 250-50 MCG/DOSE AEPB Inhale 1 puff into the lungs 2 (two) times daily.    . albuterol (PROVENTIL HFA;VENTOLIN HFA) 108 (90 BASE) MCG/ACT inhaler Inhale 2 puffs into the lungs every 6 (six) hours as needed for wheezing or shortness of breath.    . allopurinol (ZYLOPRIM) 300 MG tablet Take 300 mg by mouth daily.    . ALPRAZolam (XANAX) 0.5 MG tablet Take 0.5 mg by mouth 2 (two) times daily as needed for anxiety.    . amLODipine (NORVASC) 2.5 MG tablet Take 2.5 mg by mouth 3 (three) times a week.    . aspirin 81 MG tablet Take 81 mg by mouth daily.    . Biotin 5000 MCG CAPS Take 5,000 mcg by mouth daily.    . calcium-vitamin D 250-100 MG-UNIT tablet Take 1 tablet by mouth 2 (two) times daily. Started 2 days ago    . diclofenac Sodium (VOLTAREN) 1 % GEL APPLY 2-4 GRAMS TOPICALLY FOUR TIMES DAILY 200 g 3  . digoxin (LANOXIN) 0.125 MG tablet Take 125 mcg by mouth daily.    . doxepin (SINEQUAN) 100 MG capsule Take 100 mg by mouth at bedtime.    . fexofenadine (ALLEGRA) 60 MG tablet Take 60 mg by mouth daily.     . gabapentin (NEURONTIN) 300 MG capsule Take 1 capsule (300 mg total) by mouth 3 (three) times daily. (Patient  taking differently: Take 300 mg by mouth 2 (two) times daily.) 60 capsule 3  . ipratropium-albuterol (DUONEB) 0.5-2.5 (3) MG/3ML SOLN Inhale 3 mLs into the lungs every 4 (  four) hours as needed (SOB).     . lubiprostone (AMITIZA) 24 MCG capsule Take 24 mcg by mouth 2 (two) times daily with a meal.    . Multiple Vitamin (MULTIVITAMIN) tablet Take 1 tablet by mouth daily.    . naproxen (NAPROSYN) 500 MG tablet Take 500 mg by mouth 2 (two) times daily with a meal.     . omeprazole (PRILOSEC) 40 MG capsule Take 1 capsule (40 mg total) by mouth daily before breakfast. 30 capsule 5  . pravastatin (PRAVACHOL) 20 MG tablet Take 20 mg by mouth every other day.    . senna (SENOKOT) 8.6 MG tablet Take 5 tablets (43 mg total) by mouth daily. (Patient taking differently: Take 5 tablets by mouth 2 (two) times daily.)     No facility-administered medications prior to visit.    Allergies  Allergen Reactions  . Codeine Other (See Comments)    Pains in abdominal area  . Escitalopram Itching  . Other     NOVACAINE     ITCHING Anti-depressants  . Oxycodone     Abdominal pain  . Paxil [Paroxetine Hcl] Itching  . Procaine Itching    Review of Systems  Constitutional: Positive for appetite change and fatigue. Negative for chills and fever.       She had decreased appetite d/t abdominal pain  Respiratory: Positive for shortness of breath. Negative for cough, choking, chest tightness, wheezing and stridor.   Cardiovascular: Negative.   Gastrointestinal: Positive for abdominal distention, abdominal pain, diarrhea, nausea and vomiting. Negative for blood in stool.       She has been bloated and distended, but she began belching overnight and her distention has resolved. Symptoms per HPI.       Objective:    Physical Exam Constitutional:      Appearance: Normal appearance.  Cardiovascular:     Rate and Rhythm: Normal rate and regular rhythm.     Pulses: Normal pulses.     Heart sounds: Normal heart  sounds.  Pulmonary:     Effort: Pulmonary effort is normal.     Breath sounds: Normal breath sounds.  Abdominal:     General: Bowel sounds are normal. There is no distension.     Palpations: Abdomen is soft. There is no mass.     Tenderness: There is abdominal tenderness. There is no guarding or rebound.     Hernia: No hernia is present.  Neurological:     Mental Status: She is alert.     BP 104/79 (BP Location: Right Arm, Patient Position: Sitting, Cuff Size: Normal)   Pulse 60   Resp 18   Ht 5' 7.5" (1.715 m)   SpO2 97%   BMI 26.74 kg/m  Wt Readings from Last 3 Encounters:  12/27/19 173 lb 4.8 oz (78.6 kg)  11/24/19 171 lb (77.6 kg)  08/23/19 173 lb 8 oz (78.7 kg)    Health Maintenance Due  Topic Date Due  . COVID-19 Vaccine (1) Never done  . TETANUS/TDAP  Never done  . PNA vac Low Risk Adult (1 of 2 - PCV13) Never done    There are no preventive care reminders to display for this patient.   No results found for: TSH Lab Results  Component Value Date   WBC 6.8 11/24/2019   HGB 12.5 11/24/2019   HCT 37.9 11/24/2019   MCV 99.5 11/24/2019   PLT 172 11/24/2019   Lab Results  Component Value Date   NA 141 08/23/2019     K 3.9 08/23/2019   CO2 23 08/23/2019   GLUCOSE 121 (H) 08/23/2019   BUN 15 08/23/2019   CREATININE 0.99 08/23/2019   BILITOT 0.9 10/04/2014   ALKPHOS 50 10/04/2014   AST 25 10/04/2014   ALT 17 10/04/2014   PROT 7.1 10/04/2014   ALBUMIN 4.1 10/04/2014   CALCIUM 9.6 08/23/2019   ANIONGAP 12 08/23/2019   No results found for: CHOL No results found for: HDL No results found for: LDLCALC No results found for: TRIG No results found for: CHOLHDL No results found for: HGBA1C     Assessment & Plan:   Problem List Items Addressed This Visit      Other   Abdominal pain    -reviewed notes from Dr. Rehman -pain is worse at epigastric and periumbilical palpation sites, but is tender generally -EKG performed d/t fatigue and SOB, but this  showed NSR -she has extensive history of being laxative dependent -exam did not show mass, so not likely to have massive stool burden -distension relieved by belching -ordered CT abdomen -Rx. zofran -will check CBC and CMP; if WBC elevated will consider adding abx -we discussed that if her pain or symptoms get worse, she should go to the ED for a full work-up and STAT imaging      Relevant Orders   CBC with Differential/Platelet   CMP14+EGFR   CT Abdomen Pelvis W Contrast    Other Visit Diagnoses    Nausea    -  Primary   Relevant Medications   promethazine (PHENERGAN) injection 25 mg (Completed) (Start on 01/10/2020 11:30 AM)   Other Relevant Orders   CT Abdomen Pelvis W Contrast       Meds ordered this encounter  Medications  . promethazine (PHENERGAN) injection 25 mg  . ondansetron (ZOFRAN ODT) 8 MG disintegrating tablet    Sig: Take 1 tablet (8 mg total) by mouth every 8 (eight) hours as needed for nausea or vomiting.    Dispense:  20 tablet    Refill:  0   Time spent 49 minutes   M , NP 

## 2020-01-10 NOTE — Progress Notes (Signed)
5

## 2020-01-11 ENCOUNTER — Ambulatory Visit (INDEPENDENT_AMBULATORY_CARE_PROVIDER_SITE_OTHER): Payer: PPO | Admitting: Physical Medicine and Rehabilitation

## 2020-01-11 ENCOUNTER — Ambulatory Visit: Payer: Self-pay

## 2020-01-11 ENCOUNTER — Encounter: Payer: Self-pay | Admitting: Physical Medicine and Rehabilitation

## 2020-01-11 VITALS — BP 131/80 | HR 74

## 2020-01-11 DIAGNOSIS — M7918 Myalgia, other site: Secondary | ICD-10-CM

## 2020-01-11 DIAGNOSIS — G8929 Other chronic pain: Secondary | ICD-10-CM | POA: Diagnosis not present

## 2020-01-11 DIAGNOSIS — M546 Pain in thoracic spine: Secondary | ICD-10-CM | POA: Diagnosis not present

## 2020-01-11 DIAGNOSIS — M797 Fibromyalgia: Secondary | ICD-10-CM | POA: Diagnosis not present

## 2020-01-11 DIAGNOSIS — Z9889 Other specified postprocedural states: Secondary | ICD-10-CM

## 2020-01-11 LAB — CBC WITH DIFFERENTIAL/PLATELET
Basophils Absolute: 0 10*3/uL (ref 0.0–0.2)
Basos: 0 %
EOS (ABSOLUTE): 0.1 10*3/uL (ref 0.0–0.4)
Eos: 2 %
Hematocrit: 39.9 % (ref 34.0–46.6)
Hemoglobin: 14 g/dL (ref 11.1–15.9)
Immature Grans (Abs): 0 10*3/uL (ref 0.0–0.1)
Immature Granulocytes: 0 %
Lymphocytes Absolute: 0.6 10*3/uL — ABNORMAL LOW (ref 0.7–3.1)
Lymphs: 7 %
MCH: 32.4 pg (ref 26.6–33.0)
MCHC: 35.1 g/dL (ref 31.5–35.7)
MCV: 92 fL (ref 79–97)
Monocytes Absolute: 1.2 10*3/uL — ABNORMAL HIGH (ref 0.1–0.9)
Monocytes: 14 %
Neutrophils Absolute: 6.4 10*3/uL (ref 1.4–7.0)
Neutrophils: 77 %
Platelets: 195 10*3/uL (ref 150–450)
RBC: 4.32 x10E6/uL (ref 3.77–5.28)
RDW: 11.9 % (ref 11.7–15.4)
WBC: 8.3 10*3/uL (ref 3.4–10.8)

## 2020-01-11 LAB — CMP14+EGFR
ALT: 17 IU/L (ref 0–32)
AST: 18 IU/L (ref 0–40)
Albumin/Globulin Ratio: 1.9 (ref 1.2–2.2)
Albumin: 4.4 g/dL (ref 3.6–4.6)
Alkaline Phosphatase: 74 IU/L (ref 44–121)
BUN/Creatinine Ratio: 31 — ABNORMAL HIGH (ref 12–28)
BUN: 26 mg/dL (ref 8–27)
Bilirubin Total: 0.4 mg/dL (ref 0.0–1.2)
CO2: 24 mmol/L (ref 20–29)
Calcium: 9.4 mg/dL (ref 8.7–10.3)
Chloride: 104 mmol/L (ref 96–106)
Creatinine, Ser: 0.83 mg/dL (ref 0.57–1.00)
GFR calc Af Amer: 76 mL/min/{1.73_m2} (ref >59)
GFR calc non Af Amer: 66 mL/min/{1.73_m2} (ref >59)
Globulin, Total: 2.3 g/dL (ref 1.5–4.5)
Glucose: 133 mg/dL — ABNORMAL HIGH (ref 65–99)
Potassium: 4.1 mmol/L (ref 3.5–5.2)
Sodium: 143 mmol/L (ref 134–144)
Total Protein: 6.7 g/dL (ref 6.0–8.5)

## 2020-01-11 MED ORDER — METHYLPREDNISOLONE ACETATE 80 MG/ML IJ SUSP: 80.0000 mg | Freq: Once | INTRAMUSCULAR | Status: DC

## 2020-01-11 NOTE — Progress Notes (Signed)
Pt state middle back mostly to her left rib pain. Pt state standing makes the pain worse. Pt state laying down and pain meds help ease the pain.   Numeric Pain Rating Scale and Functional Assessment Average Pain 7   In the last MONTH (on 0-10 scale) has pain interfered with the following?  1. General activity like being  able to carry out your everyday physical activities such as walking, climbing stairs, carrying groceries, or moving a chair?  Rating(10)   +Driver, -BT, -Dye Allergies.

## 2020-01-11 NOTE — Progress Notes (Signed)
No elevated WBC, so not going to prescribe abx. Is she feeling better today?

## 2020-01-13 ENCOUNTER — Other Ambulatory Visit: Payer: Self-pay

## 2020-01-13 ENCOUNTER — Ambulatory Visit (HOSPITAL_COMMUNITY)
Admission: RE | Admit: 2020-01-13 | Discharge: 2020-01-13 | Disposition: A | Discharge status: Home / Self Care | Payer: PPO | Source: Ambulatory Visit | Attending: Nurse Practitioner | Admitting: Nurse Practitioner

## 2020-01-13 ENCOUNTER — Other Ambulatory Visit: Payer: Self-pay | Admitting: Nurse Practitioner

## 2020-01-13 DIAGNOSIS — R110 Nausea: Secondary | ICD-10-CM

## 2020-01-13 DIAGNOSIS — R1084 Generalized abdominal pain: Secondary | ICD-10-CM

## 2020-01-13 DIAGNOSIS — K8689 Other specified diseases of pancreas: Secondary | ICD-10-CM | POA: Diagnosis not present

## 2020-01-13 DIAGNOSIS — R140 Abdominal distension (gaseous): Secondary | ICD-10-CM | POA: Diagnosis not present

## 2020-01-13 DIAGNOSIS — I700 Atherosclerosis of aorta: Secondary | ICD-10-CM | POA: Diagnosis not present

## 2020-01-13 DIAGNOSIS — R109 Unspecified abdominal pain: Secondary | ICD-10-CM | POA: Diagnosis not present

## 2020-01-16 NOTE — Progress Notes (Signed)
CT of abdomen and pelvis showed acute issues.

## 2020-01-17 DIAGNOSIS — R351 Nocturia: Secondary | ICD-10-CM | POA: Diagnosis not present

## 2020-01-17 DIAGNOSIS — R3912 Poor urinary stream: Secondary | ICD-10-CM | POA: Diagnosis not present

## 2020-01-19 ENCOUNTER — Other Ambulatory Visit: Payer: Self-pay | Admitting: Nurse Practitioner

## 2020-01-19 ENCOUNTER — Telehealth: Payer: Self-pay

## 2020-01-19 DIAGNOSIS — R5382 Chronic fatigue, unspecified: Secondary | ICD-10-CM

## 2020-01-19 DIAGNOSIS — R1084 Generalized abdominal pain: Secondary | ICD-10-CM

## 2020-01-19 NOTE — Telephone Encounter (Signed)
Referral sent to Dr. Aundra Dubin

## 2020-01-19 NOTE — Telephone Encounter (Signed)
Pt husband called back and states that the actual cardiologist/vascular are in the hospital at [facility] and the referral needs to be directed to Dr. Marigene Ehlers per cardiology office.

## 2020-01-19 NOTE — Progress Notes (Signed)
Cards referral completed

## 2020-01-30 ENCOUNTER — Ambulatory Visit: Payer: PPO | Admitting: Physical Medicine and Rehabilitation

## 2020-01-30 ENCOUNTER — Telehealth: Payer: Self-pay | Admitting: Physical Medicine and Rehabilitation

## 2020-01-30 NOTE — Telephone Encounter (Signed)
L5-S1 interlam, and if she feels hips are actually an issue then f/up Dr. Magnus Ivan

## 2020-01-30 NOTE — Telephone Encounter (Signed)
Pt had Facets inj on 01/11/20 in her upper back. Pt state she has pain in her lower back and she feels pain in both hips. Pt state laying flat helps but walking and standing hurts her. Pt mention she had hips replace before.

## 2020-01-30 NOTE — Telephone Encounter (Signed)
Patient called requesting a call back about an appt. Please call pt at [phone].

## 2020-01-31 ENCOUNTER — Encounter: Payer: Self-pay | Admitting: Cardiology

## 2020-01-31 DIAGNOSIS — I5032 Chronic diastolic (congestive) heart failure: Secondary | ICD-10-CM | POA: Insufficient documentation

## 2020-01-31 DIAGNOSIS — M7989 Other specified soft tissue disorders: Secondary | ICD-10-CM | POA: Insufficient documentation

## 2020-01-31 NOTE — Progress Notes (Addendum)
Cardiology Office Note   Date:  02/01/2020   ID:  Zoe [name], DOB 1935/05/02, MRN [id number]  PCP:  Noreene Larsson, NP  Cardiologist:   No primary care provider on file. Referring:  Noreene Larsson, NP  Chief Complaint  Patient presents with  . Fatigue      History of Present Illness: Zoe [name] is a 85 y.o. female who presents for follow up of diastolic HF.  She was previously seen by Dr. Bronson Ing.    Echo in 2020 demonstrated an EF of 50 - 55%.  There were no significant valve abnormalities.  She does not really recall any problems with heart failure.  She states she did have 3 documented episodes of atrial fibrillation over the years and that is why she is on digoxin chronically.  She denies any fibrillation for the last 20 years and says is been well controlled and digoxin.  She does not have palpitations, presyncope or syncope.  She really came here today because CT of her abdomen demonstrated aortic atherosclerosis.  This was done to evaluate intermittent problems she has with GI distress without a clear etiology.  She does not get around very well because of back problems for the most part.  She can walk to the grocery store leaning on a cart.  She does not have any dyspnea necessarily with this.  She will occasionally get short of breath but she checks her oxygen level is well controlled.  She denies any chest pressure, neck or arm discomfort.  She does not feel PND or orthopnea.  She has had no weight gain or edema.  She is never had any kind of ischemia work-up that she can recall or not in a long time.   Past Medical History:  Diagnosis Date  . Abdominal discomfort 11-02-12   suspected ?UTI- PCP MD placed on Cipro  . Anxiety   . Arthritis   . Asthma   . Cancer ([place])    skin CA  . Chronic constipation   . Depression   . Dysrhythmia    tachycardia  . Environmental allergies   . Fibromyalgia   . GERD (gastroesophageal reflux disease)   . Hemorrhoids   .  Hypercholesteremia   . Hypertension     Past Surgical History:  Procedure Laterality Date  . ANAL RECTAL MANOMETRY N/A 11/28/2013   Procedure: ANO RECTAL MANOMETRY;  Surgeon: Leighton Ruff, MD;  [facility];  Service: Endoscopy;  Laterality: N/A;  . APPENDECTOMY    . BACK SURGERY     X2   . CHOLECYSTECTOMY    . COLONOSCOPY  03/29/03  . COLONOSCOPY N/A 12/09/2013   Procedure: COLONOSCOPY;  Surgeon: Rogene Houston, MD;  [facility];  Service: Endoscopy;  Laterality: N/A;  730  . COLONOSCOPY N/A 02/04/2017   Procedure: COLONOSCOPY;  Surgeon: Rogene Houston, MD;  [facility];  Service: Endoscopy;  Laterality: N/A;  930  . EYE SURGERY     bilateral cataract removal  . IR KYPHO LUMBAR INC FX REDUCE BONE BX UNI/BIL CANNULATION INC/IMAGING  11/24/2019  . IR KYPHO THORACIC WITH BONE BIOPSY  08/23/2019  . IR KYPHO THORACIC WITH BONE BIOPSY  11/24/2019  . IR RADIOLOGIST EVAL & MGMT  08/03/2019  . IR RADIOLOGIST EVAL & MGMT  09/29/2019  . IR RADIOLOGIST EVAL & MGMT  10/18/2019  . SKIN CANCER EXCISION     RIGHT NECK    . TONSILLECTOMY  T+A  . TOTAL HIP ARTHROPLASTY Right 09/14/2012   Procedure: RIGHT TOTAL HIP ARTHROPLASTY ANTERIOR APPROACH and RIGHT KNEE STEROID INJECTION;  Surgeon: Mcarthur Rossetti, MD;  Location: [facility];  Service: Orthopedics;  Laterality: Right;  . TOTAL HIP ARTHROPLASTY Left 11/05/2012   Procedure: LEFT TOTAL HIP ARTHROPLASTY ANTERIOR APPROACH;  Surgeon: Mcarthur Rossetti, MD;  [facility];  Service: Orthopedics;  Laterality: Left;  . UMBILICAL HERNIA REPAIR  04/26/03   JENKINS  . UPPER GASTROINTESTINAL ENDOSCOPY  08/03/2009   NUR  . UPPER GASTROINTESTINAL ENDOSCOPY  03/29/2003   EGD ED TCS     Current Outpatient Medications  Medication Sig Dispense Refill  . ADVAIR DISKUS 250-50 MCG/DOSE AEPB Inhale 1 puff into the lungs 2 (two) times daily.    Marland Kitchen albuterol (PROVENTIL HFA;VENTOLIN HFA) 108 (90 BASE) MCG/ACT inhaler  Inhale 2 puffs into the lungs every 6 (six) hours as needed for wheezing or shortness of breath.    . allopurinol (ZYLOPRIM) 300 MG tablet Take 300 mg by mouth daily.    Marland Kitchen ALPRAZolam (XANAX) 0.5 MG tablet Take 0.5 mg by mouth 2 (two) times daily as needed for anxiety.    Marland Kitchen amLODipine (NORVASC) 2.5 MG tablet Take 2.5 mg by mouth 3 (three) times a week.    Marland Kitchen aspirin 81 MG tablet Take 81 mg by mouth daily.    . Biotin 5000 MCG CAPS Take 5,000 mcg by mouth daily.    . calcium-vitamin D 250-100 MG-UNIT tablet Take 1 tablet by mouth 2 (two) times daily. Started 2 days ago    . diclofenac Sodium (VOLTAREN) 1 % GEL APPLY 2-4 GRAMS TOPICALLY FOUR TIMES DAILY 200 g 3  . digoxin (LANOXIN) 0.125 MG tablet Take 125 mcg by mouth daily.    Marland Kitchen doxepin (SINEQUAN) 100 MG capsule Take 100 mg by mouth at bedtime.    . fexofenadine (ALLEGRA) 60 MG tablet Take 60 mg by mouth daily.     Marland Kitchen gabapentin (NEURONTIN) 300 MG capsule Take 1 capsule (300 mg total) by mouth 3 (three) times daily. (Patient taking differently: Take 300 mg by mouth 2 (two) times daily.) 60 capsule 3  . ipratropium-albuterol (DUONEB) 0.5-2.5 (3) MG/3ML SOLN Inhale 3 mLs into the lungs every 4 (four) hours as needed (SOB).     Marland Kitchen lubiprostone (AMITIZA) 24 MCG capsule Take 24 mcg by mouth 2 (two) times daily with a meal.    . Multiple Vitamin (MULTIVITAMIN) tablet Take 1 tablet by mouth daily.    . naproxen (NAPROSYN) 500 MG tablet Take 500 mg by mouth 2 (two) times daily with a meal.     . omeprazole (PRILOSEC) 20 MG capsule Take 20 mg by mouth daily.    . ondansetron (ZOFRAN ODT) 8 MG disintegrating tablet Take 1 tablet (8 mg total) by mouth every 8 (eight) hours as needed for nausea or vomiting. 20 tablet 0  . pravastatin (PRAVACHOL) 20 MG tablet Take 20 mg by mouth every other day.    . senna (SENOKOT) 8.6 MG tablet Take 5 tablets (43 mg total) by mouth daily. (Patient taking differently: Take 5 tablets by mouth 2 (two) times daily.)     Current  Facility-Administered Medications  Medication Dose Route Frequency Provider Last Rate Last Admin  . methylPREDNISolone acetate (DEPO-MEDROL) injection 80 mg  80 mg Other Once Magnus Sinning, MD        Allergies:   Codeine, Escitalopram, Other, Oxycodone, Paxil [paroxetine hcl], and Procaine    ROS:  Please  see the history of present illness.   Otherwise, review of systems are positive for back pain, balance problems, fatigue, decreased sleep.   All other systems are reviewed and negative.    PHYSICAL EXAM: VS:  BP 120/82   Pulse 84   Ht 5' 7.5" (1.715 m)   Wt 173 lb (78.5 kg)   BMI 26.70 kg/m  , BMI Body mass index is 26.7 kg/m. GENERAL:  Well appearing HEENT:  Pupils equal round and reactive, fundi not visualized, oral mucosa unremarkable NECK:  No jugular venous distention, waveform within normal limits, carotid upstroke brisk and symmetric, no bruits, no thyromegaly LYMPHATICS:  No cervical, inguinal adenopathy LUNGS:  Clear to auscultation bilaterally BACK:  No CVA tenderness CHEST:  Unremarkable HEART:  PMI not displaced or sustained,S1 and S2 within normal limits, no S3, no S4, no clicks, no rubs, no murmurs ABD:  Flat, positive bowel sounds normal in frequency in pitch,no rebound, no guarding, no midline pulsatile mass, no hepatomegaly, no splenomegaly, questionable soft bruit. EXT:  2 plus pulses throughout, no edema, no cyanosis no clubbing SKIN:  No rashes no nodules NEURO:  Cranial nerves II through XII grossly intact, motor grossly intact throughout PSYCH:  Cognitively intact, oriented to person place and time    EKG:  EKG is not ordered today. The ekg ordered 01/10/2020 demonstrates normal sinus rhythm, rate 87, axis within normal limits, intervals within normal limits, no acute ST-T wave changes.   Recent Labs: 01/10/2020: ALT 17; BUN 26; Creatinine, Ser 0.83; Hemoglobin 14.0; Platelets 195; Potassium 4.1; Sodium 143    Lipid Panel No results found for:  CHOL, TRIG, HDL, CHOLHDL, VLDL, LDLCALC, LDLDIRECT    Wt Readings from Last 3 Encounters:  02/01/20 173 lb (78.5 kg)  12/27/19 173 lb 4.8 oz (78.6 kg)  11/24/19 171 lb (77.6 kg)      Other studies Reviewed: Additional studies/ records that were reviewed today include: Labs. Review of the above records demonstrates:  Please see elsewhere in the note.     ASSESSMENT AND PLAN:  CHRONIC DIASTOLIC HF: The patient does not recall any heart failure.  She has a slightly reduced ejection fraction as above but no problems with volume.  We talked about this and salt and fluid restriction no change in therapy is needed.  ATRIAL FIB: I would typically discontinue the digoxin but in this situation she would prefer to stay on it and she has had absolutely no symptoms since using all these years.  I see no absolute contraindication.  In the absence of recent documented atrial fibrillation I see no reason for anticoagulation.  We will follow this expectantly.  AORTIC ATHEROSCLEROSIS:   She has no chest pain.  I do not think further cardiovascular testing is suggested.  Suggested risk reduction.  DYSLIPIDEMIA: I do not see the recent LDL.  She only takes her pravastatin 3 days a week.  I am going to get these results from her primary provider and if her LDL is not at least below 161 or ideally 70 I would ask her to take the pravastatin every day.   Current medicines are reviewed at length with the patient today.  The patient does not have concerns regarding medicines.  The following changes have been made:  no change  Labs/ tests ordered today include: None No orders of the defined types were placed in this encounter.    Disposition:   FU with me as needed.     Signed, Rollene Rotunda, MD  02/01/2020 11:58 AM    [facility]

## 2020-02-01 ENCOUNTER — Ambulatory Visit: Payer: PPO | Admitting: Cardiovascular Disease

## 2020-02-01 ENCOUNTER — Ambulatory Visit: Payer: PPO | Admitting: Cardiology

## 2020-02-01 ENCOUNTER — Encounter: Payer: Self-pay | Admitting: Cardiology

## 2020-02-01 ENCOUNTER — Other Ambulatory Visit: Payer: Self-pay

## 2020-02-01 VITALS — BP 120/82 | HR 84 | Ht 67.5 in | Wt 173.0 lb

## 2020-02-01 DIAGNOSIS — I5032 Chronic diastolic (congestive) heart failure: Secondary | ICD-10-CM | POA: Diagnosis not present

## 2020-02-01 DIAGNOSIS — M7989 Other specified soft tissue disorders: Secondary | ICD-10-CM

## 2020-02-01 NOTE — Patient Instructions (Signed)
Medication Instructions:  The current medical regimen is effective;  continue present plan and medications.  *If you need a refill on your cardiac medications before your next appointment, please call your pharmacy*  Follow-Up: At [facility], you and your health needs are our priority.  As part of our continuing mission to provide you with exceptional heart care, we have created designated Provider Care Teams.  These Care Teams include your primary Cardiologist (physician) and Advanced Practice Providers (APPs -  Physician Assistants and Nurse Practitioners) who all work together to provide you with the care you need, when you need it.  Follow up as needed with Dr Antoine Poche.  We recommend signing up for the patient portal called "MyChart".  Sign up information is provided on this After Visit Summary.  MyChart is used to connect with patients for Virtual Visits (Telemedicine).  Patients are able to view lab/test results, encounter notes, upcoming appointments, etc.  Non-urgent messages can be sent to your provider as well.   To learn more about what you can do with MyChart, go to [email or web address].    Thank you for choosing [facility]!!

## 2020-02-01 NOTE — Telephone Encounter (Signed)
Scheduled for 1/24 at 1430 with driver and no blood thinners. Added to wait list.

## 2020-02-02 ENCOUNTER — Telehealth: Payer: Self-pay | Admitting: *Deleted

## 2020-02-02 DIAGNOSIS — Z87891 Personal history of nicotine dependence: Secondary | ICD-10-CM

## 2020-02-02 DIAGNOSIS — R0989 Other specified symptoms and signs involving the circulatory and respiratory systems: Secondary | ICD-10-CM

## 2020-02-02 NOTE — Telephone Encounter (Signed)
Pt has been ordered to have abdominal ultrasound for possible AAA d/t soft bruit and HX: smoking.

## 2020-02-02 NOTE — Telephone Encounter (Signed)
   Sure, Order an abdominal ultrasound to rule out AAA.  Abdominal pain, aortic atherosclerosis, bruit.    Message text    You routed conversation to Rollene Rotunda, MD 1 hour ago (9:32 AM)   Dossie Arbour, RN routed conversation to You 2 hours ago (8:30 AM)   Tyresha, Fede "Kateri Mc, MD 2 hours ago (8:28 AM)      01/01/21 Dr. Antoine Poche: Just want to run this by you for your input........... Given my history of intense and persistent abdominal and back pain that radiates to my buttocks, hips, and down my legs (especially the right leg) and feet and my history of constipation and gastro issues and my history of cyclical recurrence of episodes of sweating, dizziness, nausea and vomiting, diarrhea, and shortness of breath, it would provide me "peace of mind" if you would order an ultrasound (or whatever other test you would think best in my case) to either confirm or rule out the presence of any abdominal aortic aneurysm that might exist or be forming. I know AAA are sometimes found incidentally (since symptoms are not obvious otherwise) in tests conducted for other reasons, in my case an abdominal CT scan ordered by my Primary Care Specialist 01/10/20 for a recent vomiting, diarrhea, sweating, dizzy, nausea, shortness of breath episode.  My chronic constipation, gastro, back and leg pain, and possible lower body vascular issues have become such a challenge that ruling out the possibility of an AAA would be most helpful to me when seeking further treatment options for those existing conditions.  Thank you for your consideration. Zoe [name] DOB 19-Apr-1935

## 2020-02-06 ENCOUNTER — Telehealth: Payer: Self-pay

## 2020-02-06 DIAGNOSIS — R42 Dizziness and giddiness: Secondary | ICD-10-CM | POA: Diagnosis not present

## 2020-02-06 DIAGNOSIS — J310 Chronic rhinitis: Secondary | ICD-10-CM | POA: Diagnosis not present

## 2020-02-06 DIAGNOSIS — H608X1 Other otitis externa, right ear: Secondary | ICD-10-CM | POA: Diagnosis not present

## 2020-02-06 NOTE — Telephone Encounter (Signed)
Med refill lubiprostone (AMITIZA) [facility]

## 2020-02-07 ENCOUNTER — Other Ambulatory Visit: Payer: Self-pay

## 2020-02-07 DIAGNOSIS — K56609 Unspecified intestinal obstruction, unspecified as to partial versus complete obstruction: Secondary | ICD-10-CM

## 2020-02-07 MED ORDER — LUBIPROSTONE 24 MCG PO CAPS: 24.0000 ug | ORAL_CAPSULE | Freq: Two times a day (BID) | ORAL | 1 refills | Status: DC

## 2020-02-07 NOTE — Telephone Encounter (Signed)
Rx sent. Pt informed.  

## 2020-02-08 ENCOUNTER — Telehealth: Payer: Self-pay

## 2020-02-08 ENCOUNTER — Other Ambulatory Visit: Payer: Self-pay | Admitting: Nurse Practitioner

## 2020-02-08 MED ORDER — PREDNISONE 10 MG PO TABS: 10.0000 mg | ORAL_TABLET | Freq: Every day | ORAL | 1 refills | Status: DC

## 2020-02-08 NOTE — Telephone Encounter (Signed)
Pt called and states she needs her Prednisone 10mg  refilled and sent to [facility].

## 2020-02-08 NOTE — Telephone Encounter (Signed)
sent 

## 2020-02-13 ENCOUNTER — Ambulatory Visit (HOSPITAL_COMMUNITY)
Admission: RE | Admit: 2020-02-13 | Discharge: 2020-02-13 | Disposition: A | Discharge status: Home / Self Care | Payer: PPO | Source: Ambulatory Visit | Attending: Cardiology | Admitting: Cardiology

## 2020-02-13 ENCOUNTER — Other Ambulatory Visit: Payer: Self-pay

## 2020-02-13 DIAGNOSIS — Z87891 Personal history of nicotine dependence: Secondary | ICD-10-CM | POA: Diagnosis not present

## 2020-02-13 DIAGNOSIS — R0989 Other specified symptoms and signs involving the circulatory and respiratory systems: Secondary | ICD-10-CM

## 2020-02-13 DIAGNOSIS — Z136 Encounter for screening for cardiovascular disorders: Secondary | ICD-10-CM | POA: Diagnosis not present

## 2020-02-20 ENCOUNTER — Encounter: Payer: Self-pay | Admitting: Physical Medicine and Rehabilitation

## 2020-02-20 ENCOUNTER — Ambulatory Visit (INDEPENDENT_AMBULATORY_CARE_PROVIDER_SITE_OTHER): Payer: PPO | Admitting: Physical Medicine and Rehabilitation

## 2020-02-20 ENCOUNTER — Other Ambulatory Visit: Payer: Self-pay

## 2020-02-20 ENCOUNTER — Ambulatory Visit: Payer: Self-pay

## 2020-02-20 VITALS — BP 117/80 | HR 92

## 2020-02-20 DIAGNOSIS — M5416 Radiculopathy, lumbar region: Secondary | ICD-10-CM

## 2020-02-20 MED ORDER — BETAMETHASONE SOD PHOS & ACET 6 (3-3) MG/ML IJ SUSP
12.0000 mg | Freq: Once | INTRAMUSCULAR | Status: AC
  Administered 2020-02-20: 12 mg

## 2020-02-20 NOTE — Progress Notes (Signed)
Will call in 7 days, I fno help new MRI and PT for TP's, did L4-5 interlam.Pt state lower back pain that travels up her back. Pt state standing makes the pain worse. Pt state she has to lay down and take over the counter pain meds. Pt has hx of inj on 01/11/20 pt state it didn't work.  Numeric Pain Rating Scale and Functional Assessment Average Pain 4   In the last MONTH (on 0-10 scale) has pain interfered with the following?  1. General activity like being  able to carry out your everyday physical activities such as walking, climbing stairs, carrying groceries, or moving a chair?  Rating(9)   +Driver, -BT, -Dye Allergies.

## 2020-02-20 NOTE — Patient Instructions (Signed)

## 2020-02-22 ENCOUNTER — Ambulatory Visit: Payer: PPO | Admitting: Cardiology

## 2020-02-29 DIAGNOSIS — M722 Plantar fascial fibromatosis: Secondary | ICD-10-CM | POA: Diagnosis not present

## 2020-03-08 ENCOUNTER — Ambulatory Visit: Payer: PPO | Admitting: Nurse Practitioner

## 2020-03-12 ENCOUNTER — Encounter: Payer: Self-pay | Admitting: Physical Medicine and Rehabilitation

## 2020-03-12 NOTE — Progress Notes (Signed)
Zoe [name] - 85 y.o. female MRN [id number]  Date of birth: 11-Jul-1935  Office Visit Note: Visit Date: 01/11/2020 PCP: System, Provider Not In Referred by: Noreene Larsson, NP  Subjective: Chief Complaint  Patient presents with  . Middle Back - Pain   HPI: Zoe [name] is a 85 y.o. female who comes in today For planned L4-5 interlaminar epidural steroid injection.  She has been followed by Dr. Jean Rosenthal.  She has had updated MRIs of the thoracic and lumbar spine.  She has a history of multiple kyphoplasty's down to L1.  Her pain complaints are really all over upper back mid back low back.  When she saw Dr. Ninfa Linden he was more focused on her lower back and suggested epidural injection.  She is getting more pain even towards the right rib at this point posterior laterally.  She endorses no radicular leg pain.  Most of her back pain is with standing and moving.  She has a history of fibromyalgia with multiple drug intolerances including SSRIs.  She does carry a diagnosis of anxiety.  She rates her pain as a 7 out of 10.  She has facet arthropathy throughout the lower spine.  Particularly L3-4 shows facet arthropathy with facet joint cyst centrally.  Pain medication seem to help her pain and laying down.  She has had multiple rounds of physical therapy but not really doing exercises at this point.  No red flag complaints of focal weakness or bowel or bladder changes etc.  Review of Systems  Musculoskeletal: Positive for back pain.  All other systems reviewed and are negative.  Otherwise per HPI.  Assessment & Plan: Visit Diagnoses:    ICD-10-CM   1. Chronic bilateral thoracic back pain  M54.6 XR C-ARM NO REPORT   G89.29 Facet Injection    DISCONTINUED: methylPREDNISolone acetate (DEPO-MEDROL) injection 80 mg  2. Myofascial pain syndrome  M79.18   3. History of kyphoplasty  Z98.890   4. Fibromyalgia  M79.7      Plan: Findings:  Very tough case of chronic back pain both  upper back mid back and lower back.  Her biggest complaint is middle back pain today points really at the thoracolumbar junction.  She has had kyphoplasty in this area.  Today we'll get a complete diagnostic and hopefully therapeutic facet joint blocks at L3-4.  This is at the level of greatest facet arthropathy.  We had a long discussion today about fibromyalgia and myofascial pain and trigger points.  She really does hurt all over.  She continue to follow-up with Dr. Ninfa Linden from an orthopedic standpoint.  She does represent a difficult case of chronic pain.  She may end up being a better referral for medication management.  In terms of fibromyalgia the best approach is consistent sleep hygiene as well as physical activity.    Meds & Orders:  Meds ordered this encounter  Medications  . DISCONTD: methylPREDNISolone acetate (DEPO-MEDROL) injection 80 mg    Orders Placed This Encounter  Procedures  . Facet Injection  . XR C-ARM NO REPORT    Follow-up: Return if symptoms worsen or fail to improve.   Procedures: No procedures performed  Lumbar Diagnostic Facet Joint Nerve Block with Fluoroscopic Guidance   Patient: Zoe [name]      Date of Birth: 11/19/1935 MRN: [id number] PCP: System, Provider Not In      Visit Date: 01/11/2020   Universal Protocol:    Date/Time: 02/14/226:05 AM  Consent Given  By: the patient  Position: PRONE  Additional Comments: Vital signs were monitored before and after the procedure. Patient was prepped and draped in the usual sterile fashion. The correct patient, procedure, and site was verified.   Injection Procedure Details:   Procedure diagnoses:  1. Chronic bilateral thoracic back pain   2. Myofascial pain syndrome   3. History of kyphoplasty   4. Fibromyalgia      Meds Administered:  Meds ordered this encounter  Medications  . DISCONTD: methylPREDNISolone acetate (DEPO-MEDROL) injection 80 mg     Laterality:  Bilateral  Location/Site: L3-L4, L2 and L3 medial branches  Needle: 5.0 in., 25 ga.  Short bevel or Quincke spinal needle  Needle Placement: Oblique pedical  Findings:   -Comments: There was excellent flow of contrast along the articular pillars without intravascular flow.  Procedure Details: The fluoroscope beam is vertically oriented in AP and then obliqued 15 to 20 degrees to the ipsilateral side of the desired nerve to achieve the "Scotty dog" appearance.  The skin over the target area of the junction of the superior articulating process and the transverse process (sacral ala if blocking the L5 dorsal rami) was locally anesthetized with a 1 ml volume of 1% Lidocaine without Epinephrine.  The spinal needle was inserted and advanced in a trajectory view down to the target.   After contact with periosteum and negative aspirate for blood and CSF, correct placement without intravascular or epidural spread was confirmed by injecting 0.5 ml. of Isovue-250.  A spot radiograph was obtained of this image.    Next, a 0.5 ml. volume of the injectate described above was injected. The needle was then redirected to the other facet joint nerves mentioned above if needed.  Prior to the procedure, the patient was given a Pain Diary which was completed for baseline measurements.  After the procedure, the patient rated their pain every 30 minutes and will continue rating at this frequency for a total of 5 hours.  The patient has been asked to complete the Diary and return to [place] by mail, fax or hand delivered as soon as possible.   Additional Comments:  The patient tolerated the procedure well Dressing: 2 x 2 sterile gauze and Band-Aid    Post-procedure details: Patient was observed during the procedure. Post-procedure instructions were reviewed.  Patient left the clinic in stable condition.     Clinical History: MRI THORACIC AND LUMBAR SPINE WITHOUT CONTRAST  TECHNIQUE: Multiplanar and  multiecho pulse sequences of the thoracic and lumbar spine were obtained without intravenous contrast.  COMPARISON:  10/13/2019 MRI thoracic spine and prior. 10/20/2019 MRI lumbar spine.  FINDINGS: MRI THORACIC SPINE FINDINGS  Alignment: Minimal grade 1 stepwise anterolisthesis at the T10-L1 levels.  Vertebrae: Chronic T6 and T7 superior endplate deformities with mild height loss are unchanged. Sequela of kyphoplasty and chronic mild height loss at the T11 and T12 levels. No new fracture deformity. No significant retropulsion.  Cord:  Normal signal and morphology.  Paraspinal and other soft tissues: Negative.  Disc levels:  Multilevel desiccation. Mild T9-10 disc space loss. Minimal posterior disc bulge at the T3-T7 levels. Mild T9-10 disc bulge. Minimal disc bulge at the T10-L1 levels. Multilevel facet degenerative spurring.  Partial effacement of the ventral CSF containing spaces at the T9-10 level. Patent spinal canal and neural foramen at the remaining thoracic levels.  MRI LUMBAR SPINE FINDINGS  Segmentation:  Standard.  Alignment: Stepwise grade 1 retrolisthesis at the L1-4 levels. Straightening of lordosis.  Vertebrae:  Sequela of T11-L1 kyphoplasties with mild chronic height loss. Chronic superior L4 endplate deformity is unchanged. Multilevel Schmorl's node formation and Modic type 2 endplate degenerative changes.  Conus medullaris and cauda equina: Conus extends to the L1 level. Conus and cauda equina appear normal.  Disc levels: Multilevel desiccation, disc space loss and osteophytosis.  L1-2: Mild disc bulge and bilateral facet hypertrophy. Sequela of left laminectomy. Patent spinal canal. Mild bilateral neural foraminal narrowing.  L2-3: Disc bulge with superimposed right paracentral protrusion. Bilateral facet hypertrophy. Sequela of left laminectomy. Patent spinal canal. Bilateral neural foraminal narrowing.  L3-4: Disc bulge  with ligamentum flavum and bilateral facet hypertrophy. Redemonstration of 8 mm posterior midline synovial cyst with prominent overlying epidural fat (15:20). Mild spinal canal and moderate to severe bilateral neural foraminal narrowing, unchanged.  L4-5: Disc bulge, ligamentum flavum and bilateral facet hypertrophy. Patent spinal canal. Moderate to severe bilateral neural foraminal narrowing, unchanged.  L5-S1: Disc bulge with shallow central protrusion. Bilateral facet hypertrophy. Patent spinal canal. Moderate bilateral neural foraminal narrowing, unchanged.  Paraspinal and other soft tissues: Negative.  IMPRESSION: Chronic thoracolumbar fracture deformities and sequela of multilevel kyphoplasty, unchanged. No acute/subacute fracture deformity.  Multilevel thoracolumbar spondylosis, grossly unchanged.  8 mm posterior midline L3-4 synovial cyst with mild spinal canal narrowing is similar to prior exam.  Moderate to severe bilateral neural foraminal narrowing at the L3-S1 levels, unchanged.   Electronically Signed   By: Primitivo Gauze M.D.   On: 12/09/2019 10:30   She reports that she has been smoking cigarettes. She has a 12.50 pack-year smoking history. She has never used smokeless tobacco. No results for input(s): HGBA1C, LABURIC in the last 8760 hours.  Objective:  VS:  HT:    WT:   BMI:     BP:131/80  HR:74bpm  TEMP: ( )  RESP:  Physical Exam Vitals and nursing note reviewed.  Constitutional:      General: She is not in acute distress.    Appearance: Normal appearance. She is not ill-appearing.  HENT:     Head: Normocephalic and atraumatic.     Right Ear: External ear normal.     Left Ear: External ear normal.  Eyes:     Extraocular Movements: Extraocular movements intact.  Cardiovascular:     Rate and Rhythm: Normal rate.     Pulses: Normal pulses.  Pulmonary:     Effort: Pulmonary effort is normal. No respiratory distress.  Abdominal:      General: There is no distension.     Palpations: Abdomen is soft.  Musculoskeletal:        General: Tenderness present.     Cervical back: Neck supple.     Right lower leg: No edema.     Left lower leg: No edema.     Comments: Patient has good distal strength with no pain over the greater trochanters.  No clonus or focal weakness.Patient somewhat slow to rise from a seated position to full extension.  There is concordant low back pain with facet loading and lumbar spine extension rotation.  There are no definitive trigger points but the patient is somewhat tender across the lower back and PSIS.  There is no pain with hip rotation.   Skin:    Findings: No erythema, lesion or rash.  Neurological:     General: No focal deficit present.     Mental Status: She is alert and oriented to person, place, and time.     Sensory: No sensory deficit.  Motor: No weakness or abnormal muscle tone.     Coordination: Coordination normal.  Psychiatric:        Mood and Affect: Mood normal.        Behavior: Behavior normal.     Ortho Exam  Imaging: No results found.  Past Medical/Family/Surgical/Social History: Medications & Allergies reviewed per EMR, new medications updated. Patient Active Problem List   Diagnosis Date Noted  . Leg swelling 01/31/2020  . Chronic diastolic CHF (congestive heart failure) ([place]) 01/31/2020  . Abdominal pain 01/10/2020  . Esophageal dysphagia 06/21/2019  . Stenosis of lateral recess of lumbar spine 12/06/2018  . Foraminal stenosis of lumbar region 12/06/2018  . Spondylosis without myelopathy or radiculopathy, lumbar region 12/06/2018  . Other idiopathic scoliosis, thoracolumbar region 12/06/2018  . Chronic left shoulder pain 12/06/2018  . History of colonic polyps 10/22/2016  . SBO (small bowel obstruction) ([place]) 03/02/2013  . Hyperlipidemia 12/08/2012  . Gout 12/08/2012  . Dysrhythmia 12/08/2012  . Depression 12/08/2012  . Asthma 12/08/2012  . Fibromyalgia  12/08/2012  . Constipation 12/08/2012  . Acute posthemorrhagic anemia 12/07/2012  . Benign essential hypertension 12/07/2012  . Degenerative arthritis of hip, left 11/05/2012  . Weakness of right leg 03/24/2011  . OA (osteoarthritis) 03/24/2011  . Stiffness of cervical spine 03/24/2011  . GERD (gastroesophageal reflux disease) 11/19/2010  . Spinal stenosis 04/18/2009  . HIP PAIN 11/17/2007  . DDD (degenerative disc disease), lumbosacral 11/17/2007  . ANSERINE BURSITIS, RIGHT 08/11/2007  . Sciatica 06/22/2007  . KNEE PAIN 03/17/2007  . Knee pain 03/17/2007   Past Medical History:  Diagnosis Date  . Abdominal discomfort 11-02-12   suspected ?UTI- PCP MD placed on Cipro  . Anxiety   . Arthritis   . Asthma   . Cancer ([place])    skin CA  . Chronic constipation   . Depression   . Dysrhythmia    tachycardia  . Environmental allergies   . Fibromyalgia   . GERD (gastroesophageal reflux disease)   . Hemorrhoids   . Hypercholesteremia   . Hypertension    Family History  Problem Relation Age of Onset  . Healthy Son   . Healthy Son   . Healthy Daughter   . Colon cancer Neg Hx    Past Surgical History:  Procedure Laterality Date  . ANAL RECTAL MANOMETRY N/A 11/28/2013   Procedure: ANO RECTAL MANOMETRY;  Surgeon: Leighton Ruff, MD;  Location: [facility];  Service: Endoscopy;  Laterality: N/A;  . APPENDECTOMY    . BACK SURGERY     X2   . CHOLECYSTECTOMY    . COLONOSCOPY  03/29/03  . COLONOSCOPY N/A 12/09/2013   Procedure: COLONOSCOPY;  Surgeon: Rogene Houston, MD;  Location: [facility];  Service: Endoscopy;  Laterality: N/A;  730  . COLONOSCOPY N/A 02/04/2017   Procedure: COLONOSCOPY;  Surgeon: Rogene Houston, MD;  Location: [facility];  Service: Endoscopy;  Laterality: N/A;  930  . EYE SURGERY     bilateral cataract removal  . IR KYPHO LUMBAR INC FX REDUCE BONE BX UNI/BIL CANNULATION INC/IMAGING  11/24/2019  . IR KYPHO THORACIC WITH BONE BIOPSY  08/23/2019  . IR  KYPHO THORACIC WITH BONE BIOPSY  11/24/2019  . IR RADIOLOGIST EVAL & MGMT  08/03/2019  . IR RADIOLOGIST EVAL & MGMT  09/29/2019  . IR RADIOLOGIST EVAL & MGMT  10/18/2019  . SKIN CANCER EXCISION     RIGHT NECK    . TONSILLECTOMY     T+A  .  TOTAL HIP ARTHROPLASTY Right 09/14/2012   Procedure: RIGHT TOTAL HIP ARTHROPLASTY ANTERIOR APPROACH and RIGHT KNEE STEROID INJECTION;  Surgeon: Mcarthur Rossetti, MD;  Location: [facility];  Service: Orthopedics;  Laterality: Right;  . TOTAL HIP ARTHROPLASTY Left 11/05/2012   Procedure: LEFT TOTAL HIP ARTHROPLASTY ANTERIOR APPROACH;  Surgeon: Mcarthur Rossetti, MD;  Location: [facility];  Service: Orthopedics;  Laterality: Left;  . UMBILICAL HERNIA REPAIR  04/26/03   JENKINS  . UPPER GASTROINTESTINAL ENDOSCOPY  08/03/2009   NUR  . UPPER GASTROINTESTINAL ENDOSCOPY  03/29/2003   EGD ED TCS   Social History   Occupational History  . Not on file  Tobacco Use  . Smoking status: Current Some Day Smoker    Packs/day: 0.25    Years: 50.00    Pack years: 12.50    Types: Cigarettes    Last attempt to quit: 11/10/2018    Years since quitting: 1.3  . Smokeless tobacco: Never Used  . Tobacco comment: Hasn't smoked in 5-6 weeks  Vaping Use  . Vaping Use: Never used  Substance and Sexual Activity  . Alcohol use: Yes    Alcohol/week: 0.0 standard drinks    Comment: rare  . Drug use: No  . Sexual activity: Not Currently    Birth control/protection: Post-menopausal

## 2020-03-12 NOTE — Procedures (Signed)
Lumbar Diagnostic Facet Joint Nerve Block with Fluoroscopic Guidance   Patient: Zoe [name]      Date of Birth: February 17, 1935 MRN: [id number] PCP: System, Provider Not In      Visit Date: 01/11/2020   Universal Protocol:    Date/Time: 02/14/226:05 AM  Consent Given By: the patient  Position: PRONE  Additional Comments: Vital signs were monitored before and after the procedure. Patient was prepped and draped in the usual sterile fashion. The correct patient, procedure, and site was verified.   Injection Procedure Details:   Procedure diagnoses:  1. Chronic bilateral thoracic back pain   2. Myofascial pain syndrome   3. History of kyphoplasty   4. Fibromyalgia      Meds Administered:  Meds ordered this encounter  Medications  . DISCONTD: methylPREDNISolone acetate (DEPO-MEDROL) injection 80 mg     Laterality: Bilateral  Location/Site: L3-L4, L2 and L3 medial branches  Needle: 5.0 in., 25 ga.  Short bevel or Quincke spinal needle  Needle Placement: Oblique pedical  Findings:   -Comments: There was excellent flow of contrast along the articular pillars without intravascular flow.  Procedure Details: The fluoroscope beam is vertically oriented in AP and then obliqued 15 to 20 degrees to the ipsilateral side of the desired nerve to achieve the "Scotty dog" appearance.  The skin over the target area of the junction of the superior articulating process and the transverse process (sacral ala if blocking the L5 dorsal rami) was locally anesthetized with a 1 ml volume of 1% Lidocaine without Epinephrine.  The spinal needle was inserted and advanced in a trajectory view down to the target.   After contact with periosteum and negative aspirate for blood and CSF, correct placement without intravascular or epidural spread was confirmed by injecting 0.5 ml. of Isovue-250.  A spot radiograph was obtained of this image.    Next, a 0.5 ml. volume of the injectate described above  was injected. The needle was then redirected to the other facet joint nerves mentioned above if needed.  Prior to the procedure, the patient was given a Pain Diary which was completed for baseline measurements.  After the procedure, the patient rated their pain every 30 minutes and will continue rating at this frequency for a total of 5 hours.  The patient has been asked to complete the Diary and return to [place] by mail, fax or hand delivered as soon as possible.   Additional Comments:  The patient tolerated the procedure well Dressing: 2 x 2 sterile gauze and Band-Aid    Post-procedure details: Patient was observed during the procedure. Post-procedure instructions were reviewed.  Patient left the clinic in stable condition.

## 2020-03-15 ENCOUNTER — Other Ambulatory Visit: Payer: Self-pay | Admitting: Interventional Radiology

## 2020-03-15 DIAGNOSIS — M546 Pain in thoracic spine: Secondary | ICD-10-CM

## 2020-03-15 DIAGNOSIS — M5440 Lumbago with sciatica, unspecified side: Secondary | ICD-10-CM

## 2020-03-26 DIAGNOSIS — L821 Other seborrheic keratosis: Secondary | ICD-10-CM | POA: Diagnosis not present

## 2020-03-26 DIAGNOSIS — D0439 Carcinoma in situ of skin of other parts of face: Secondary | ICD-10-CM | POA: Diagnosis not present

## 2020-03-26 DIAGNOSIS — D485 Neoplasm of uncertain behavior of skin: Secondary | ICD-10-CM | POA: Diagnosis not present

## 2020-03-28 ENCOUNTER — Ambulatory Visit (HOSPITAL_COMMUNITY)
Admission: RE | Admit: 2020-03-28 | Discharge: 2020-03-28 | Disposition: A | Discharge status: Home / Self Care | Payer: PPO | Source: Ambulatory Visit | Attending: Interventional Radiology | Admitting: Interventional Radiology

## 2020-03-28 ENCOUNTER — Other Ambulatory Visit: Payer: Self-pay

## 2020-03-28 DIAGNOSIS — M546 Pain in thoracic spine: Secondary | ICD-10-CM | POA: Insufficient documentation

## 2020-03-28 DIAGNOSIS — M47814 Spondylosis without myelopathy or radiculopathy, thoracic region: Secondary | ICD-10-CM | POA: Diagnosis not present

## 2020-03-28 DIAGNOSIS — M5440 Lumbago with sciatica, unspecified side: Secondary | ICD-10-CM | POA: Diagnosis not present

## 2020-03-28 DIAGNOSIS — M5125 Other intervertebral disc displacement, thoracolumbar region: Secondary | ICD-10-CM | POA: Diagnosis not present

## 2020-03-28 DIAGNOSIS — M545 Low back pain, unspecified: Secondary | ICD-10-CM | POA: Diagnosis not present

## 2020-03-29 DIAGNOSIS — H353131 Nonexudative age-related macular degeneration, bilateral, early dry stage: Secondary | ICD-10-CM | POA: Diagnosis not present

## 2020-03-29 DIAGNOSIS — K219 Gastro-esophageal reflux disease without esophagitis: Secondary | ICD-10-CM | POA: Diagnosis not present

## 2020-03-29 DIAGNOSIS — R7301 Impaired fasting glucose: Secondary | ICD-10-CM | POA: Diagnosis not present

## 2020-03-29 DIAGNOSIS — E7849 Other hyperlipidemia: Secondary | ICD-10-CM | POA: Diagnosis not present

## 2020-03-29 DIAGNOSIS — H26492 Other secondary cataract, left eye: Secondary | ICD-10-CM | POA: Diagnosis not present

## 2020-03-29 DIAGNOSIS — H04123 Dry eye syndrome of bilateral lacrimal glands: Secondary | ICD-10-CM | POA: Diagnosis not present

## 2020-03-29 DIAGNOSIS — H40013 Open angle with borderline findings, low risk, bilateral: Secondary | ICD-10-CM | POA: Diagnosis not present

## 2020-03-29 DIAGNOSIS — H524 Presbyopia: Secondary | ICD-10-CM | POA: Diagnosis not present

## 2020-03-29 DIAGNOSIS — I10 Essential (primary) hypertension: Secondary | ICD-10-CM | POA: Diagnosis not present

## 2020-03-29 DIAGNOSIS — I499 Cardiac arrhythmia, unspecified: Secondary | ICD-10-CM | POA: Diagnosis not present

## 2020-03-30 ENCOUNTER — Other Ambulatory Visit: Payer: Self-pay | Admitting: Interventional Radiology

## 2020-03-30 DIAGNOSIS — S32000A Wedge compression fracture of unspecified lumbar vertebra, initial encounter for closed fracture: Secondary | ICD-10-CM

## 2020-03-30 LAB — CBC WITH DIFFERENTIAL/PLATELET
Basophils Absolute: 0 10*3/uL (ref 0.0–0.2)
Basos: 1 %
EOS (ABSOLUTE): 0.5 10*3/uL — ABNORMAL HIGH (ref 0.0–0.4)
Eos: 7 %
Hematocrit: 37.8 % (ref 34.0–46.6)
Hemoglobin: 12.6 g/dL (ref 11.1–15.9)
Immature Grans (Abs): 0 10*3/uL (ref 0.0–0.1)
Immature Granulocytes: 1 %
Lymphocytes Absolute: 1.7 10*3/uL (ref 0.7–3.1)
Lymphs: 22 %
MCH: 31.9 pg (ref 26.6–33.0)
MCHC: 33.3 g/dL (ref 31.5–35.7)
MCV: 96 fL (ref 79–97)
Monocytes Absolute: 0.5 10*3/uL (ref 0.1–0.9)
Monocytes: 7 %
Neutrophils Absolute: 4.7 10*3/uL (ref 1.4–7.0)
Neutrophils: 62 %
Platelets: 200 10*3/uL (ref 150–450)
RBC: 3.95 x10E6/uL (ref 3.77–5.28)
RDW: 12.7 % (ref 11.7–15.4)
WBC: 7.5 10*3/uL (ref 3.4–10.8)

## 2020-03-30 LAB — LIPID PANEL
Chol/HDL Ratio: 3.2 ratio (ref 0.0–4.4)
Cholesterol, Total: 155 mg/dL (ref 100–199)
HDL: 49 mg/dL (ref >39)
LDL Chol Calc (NIH): 80 mg/dL (ref 0–99)
Triglycerides: 150 mg/dL — ABNORMAL HIGH (ref 0–149)
VLDL Cholesterol Cal: 26 mg/dL (ref 5–40)

## 2020-03-30 LAB — CMP14+EGFR
ALT: 16 IU/L (ref 0–32)
AST: 19 IU/L (ref 0–40)
Albumin/Globulin Ratio: 1.6 (ref 1.2–2.2)
Albumin: 4.1 g/dL (ref 3.6–4.6)
Alkaline Phosphatase: 74 IU/L (ref 44–121)
BUN/Creatinine Ratio: 20 (ref 12–28)
BUN: 13 mg/dL (ref 8–27)
Bilirubin Total: 0.3 mg/dL (ref 0.0–1.2)
CO2: 20 mmol/L (ref 20–29)
Calcium: 9.3 mg/dL (ref 8.7–10.3)
Chloride: 104 mmol/L (ref 96–106)
Creatinine, Ser: 0.65 mg/dL (ref 0.57–1.00)
Globulin, Total: 2.5 g/dL (ref 1.5–4.5)
Glucose: 112 mg/dL — ABNORMAL HIGH (ref 65–99)
Potassium: 4.5 mmol/L (ref 3.5–5.2)
Sodium: 141 mmol/L (ref 134–144)
Total Protein: 6.6 g/dL (ref 6.0–8.5)
eGFR: 88 mL/min/{1.73_m2} (ref >59)

## 2020-03-30 NOTE — Progress Notes (Signed)
Labs look great. I look forward to seeing her on 3/8.

## 2020-03-31 LAB — HEMOGLOBIN A1C
Est. average glucose Bld gHb Est-mCnc: 97 mg/dL
Hgb A1c MFr Bld: 5 % (ref 4.8–5.6)

## 2020-03-31 LAB — SPECIMEN STATUS REPORT

## 2020-04-03 ENCOUNTER — Encounter: Payer: Self-pay | Admitting: Nurse Practitioner

## 2020-04-03 ENCOUNTER — Other Ambulatory Visit: Payer: Self-pay

## 2020-04-03 ENCOUNTER — Ambulatory Visit (INDEPENDENT_AMBULATORY_CARE_PROVIDER_SITE_OTHER): Payer: PPO | Admitting: Nurse Practitioner

## 2020-04-03 VITALS — BP 112/77 | HR 86 | Temp 98.0°F | Resp 20 | Ht 67.5 in | Wt 167.0 lb

## 2020-04-03 DIAGNOSIS — Z139 Encounter for screening, unspecified: Secondary | ICD-10-CM

## 2020-04-03 DIAGNOSIS — I4891 Unspecified atrial fibrillation: Secondary | ICD-10-CM

## 2020-04-03 DIAGNOSIS — M5137 Other intervertebral disc degeneration, lumbosacral region: Secondary | ICD-10-CM

## 2020-04-03 DIAGNOSIS — M130 Polyarthritis, unspecified: Secondary | ICD-10-CM

## 2020-04-03 DIAGNOSIS — R110 Nausea: Secondary | ICD-10-CM | POA: Diagnosis not present

## 2020-04-03 DIAGNOSIS — E785 Hyperlipidemia, unspecified: Secondary | ICD-10-CM

## 2020-04-03 DIAGNOSIS — I10 Essential (primary) hypertension: Secondary | ICD-10-CM | POA: Diagnosis not present

## 2020-04-03 MED ORDER — GABAPENTIN 300 MG PO CAPS: 300.0000 mg | ORAL_CAPSULE | Freq: Three times a day (TID) | ORAL | 3 refills | Status: DC

## 2020-04-03 NOTE — Assessment & Plan Note (Signed)
-  nausea has been ongoing, and she has significant GI history -she has been taking digoxin for over 30 years, but she states it was started by a PCP and she has not had any digoxin levels monitored -will check digoxin level today

## 2020-04-03 NOTE — Assessment & Plan Note (Signed)
-  had kyphoplasty x2 in 2021 -will get DEXA scan

## 2020-04-03 NOTE — Progress Notes (Signed)
Established Patient Office Visit  Subjective:  Patient ID: Zoe [name], female    DOB: 1935/09/25  Age: 85 y.o. MRN: [id number]  CC:  Chief Complaint  Patient presents with  . New Patient (Initial Visit)    HPI [name] presents for lab follow-up.  She has hx of chronic abdominal pain. She was referred to Dr. Laural Golden at her visit on 01/10/20.  She has been laxative dependent for many years.  She was having chronic fatigue, so she was referred to cardiology to r/o heart issues. She sees Dr. Percival Spanish for cardiology and her LVEF in 2020 was 50-55%. She had an abdominal CT that showed abdominal atherosclerosis.  Her last EKG 01/10/20 showed NSR. She had hx of a-fib and she has been taking digoxin for years. She takes pravastatin 3x per week.  She had kyphoplasty x 2. One in July and 1 in Oct 2021.  She gets right ear pain that she sees ENT for, Dr. Benjamine Mola. She states she has been taking steroid drops to her right ear.   Past Medical History:  Diagnosis Date  . Abdominal discomfort 11-02-12   suspected ?UTI- PCP MD placed on Cipro  . Anxiety   . Arthritis   . Asthma   . Cancer ([place])    skin CA  . Chronic constipation   . Depression   . Dysrhythmia    tachycardia  . Environmental allergies   . Fibromyalgia   . GERD (gastroesophageal reflux disease)   . Hemorrhoids   . Hypercholesteremia   . Hypertension     Past Surgical History:  Procedure Laterality Date  . ANAL RECTAL MANOMETRY N/A 11/28/2013   Procedure: ANO RECTAL MANOMETRY;  Surgeon: Leighton Ruff, MD;  Location: [facility];  Service: Endoscopy;  Laterality: N/A;  . APPENDECTOMY    . BACK SURGERY     X2   . CHOLECYSTECTOMY    . COLONOSCOPY  03/29/03  . COLONOSCOPY N/A 12/09/2013   Procedure: COLONOSCOPY;  Surgeon: Rogene Houston, MD;  Location: [facility];  Service: Endoscopy;  Laterality: N/A;  730  . COLONOSCOPY N/A 02/04/2017   Procedure: COLONOSCOPY;  Surgeon: Rogene Houston, MD;  Location:  [facility];  Service: Endoscopy;  Laterality: N/A;  930  . EYE SURGERY     bilateral cataract removal  . IR KYPHO LUMBAR INC FX REDUCE BONE BX UNI/BIL CANNULATION INC/IMAGING  11/24/2019  . IR KYPHO THORACIC WITH BONE BIOPSY  08/23/2019  . IR KYPHO THORACIC WITH BONE BIOPSY  11/24/2019  . IR RADIOLOGIST EVAL & MGMT  08/03/2019  . IR RADIOLOGIST EVAL & MGMT  09/29/2019  . IR RADIOLOGIST EVAL & MGMT  10/18/2019  . SKIN CANCER EXCISION     RIGHT NECK    . TONSILLECTOMY     T+A  . TOTAL HIP ARTHROPLASTY Right 09/14/2012   Procedure: RIGHT TOTAL HIP ARTHROPLASTY ANTERIOR APPROACH and RIGHT KNEE STEROID INJECTION;  Surgeon: Mcarthur Rossetti, MD;  Location: [facility];  Service: Orthopedics;  Laterality: Right;  . TOTAL HIP ARTHROPLASTY Left 11/05/2012   Procedure: LEFT TOTAL HIP ARTHROPLASTY ANTERIOR APPROACH;  Surgeon: Mcarthur Rossetti, MD;  Location: [facility];  Service: Orthopedics;  Laterality: Left;  . UMBILICAL HERNIA REPAIR  04/26/03   JENKINS  . UPPER GASTROINTESTINAL ENDOSCOPY  08/03/2009   NUR  . UPPER GASTROINTESTINAL ENDOSCOPY  03/29/2003   EGD ED TCS    Family History  Problem Relation Age of Onset  . Healthy Son   .  Healthy Son   . Healthy Daughter   . Colon cancer Neg Hx     Social History   Socioeconomic History  . Marital status: Married    Spouse name: Not on file  . Number of children: Not on file  . Years of education: Not on file  . Highest education level: Not on file  Occupational History  . Not on file  Tobacco Use  . Smoking status: Current Some Day Smoker    Packs/day: 0.25    Years: 50.00    Pack years: 12.50    Types: Cigarettes    Last attempt to quit: 11/10/2018    Years since quitting: 1.3  . Smokeless tobacco: Never Used  . Tobacco comment: Hasn't smoked in 5-6 weeks  Vaping Use  . Vaping Use: Never used  Substance and Sexual Activity  . Alcohol use: Yes    Alcohol/week: 0.0 standard drinks    Comment: rare  . Drug use: No  .  Sexual activity: Not Currently    Birth control/protection: Post-menopausal  Other Topics Concern  . Not on file  Social History Narrative  . Not on file   Social Determinants of Health   Financial Resource Strain: Not on file  Food Insecurity: Not on file  Transportation Needs: Not on file  Physical Activity: Not on file  Stress: Not on file  Social Connections: Not on file  Intimate Partner Violence: Not on file    Outpatient Medications Prior to Visit  Medication Sig Dispense Refill  . ADVAIR DISKUS 250-50 MCG/DOSE AEPB Inhale 1 puff into the lungs 2 (two) times daily.    Marland Kitchen albuterol (PROVENTIL HFA;VENTOLIN HFA) 108 (90 BASE) MCG/ACT inhaler Inhale 2 puffs into the lungs every 6 (six) hours as needed for wheezing or shortness of breath.    . allopurinol (ZYLOPRIM) 300 MG tablet Take 300 mg by mouth daily.    Marland Kitchen ALPRAZolam (XANAX) 0.5 MG tablet Take 0.5 mg by mouth 2 (two) times daily as needed for anxiety.    Marland Kitchen amLODipine (NORVASC) 2.5 MG tablet Take 2.5 mg by mouth 3 (three) times a week.    Marland Kitchen aspirin 81 MG tablet Take 81 mg by mouth daily.    . Biotin 5000 MCG CAPS Take 5,000 mcg by mouth daily.    . calcium-vitamin D 250-100 MG-UNIT tablet Take 1 tablet by mouth 2 (two) times daily. Started 2 days ago    . diclofenac Sodium (VOLTAREN) 1 % GEL APPLY 2-4 GRAMS TOPICALLY FOUR TIMES DAILY 200 g 3  . digoxin (LANOXIN) 0.125 MG tablet Take 125 mcg by mouth daily.    Marland Kitchen doxepin (SINEQUAN) 100 MG capsule Take 100 mg by mouth at bedtime.    . fexofenadine (ALLEGRA) 60 MG tablet Take 60 mg by mouth daily.     Marland Kitchen ipratropium-albuterol (DUONEB) 0.5-2.5 (3) MG/3ML SOLN Inhale 3 mLs into the lungs every 4 (four) hours as needed (SOB).     Marland Kitchen lubiprostone (AMITIZA) 24 MCG capsule Take 1 capsule (24 mcg total) by mouth 2 (two) times daily with a meal. 60 capsule 1  . Multiple Vitamin (MULTIVITAMIN) tablet Take 1 tablet by mouth daily.    . naproxen (NAPROSYN) 500 MG tablet Take 500 mg by mouth 2  (two) times daily with a meal.     . omeprazole (PRILOSEC) 20 MG capsule Take 20 mg by mouth daily.    . ondansetron (ZOFRAN ODT) 8 MG disintegrating tablet Take 1 tablet (8 mg total) by mouth  every 8 (eight) hours as needed for nausea or vomiting. 20 tablet 0  . pravastatin (PRAVACHOL) 20 MG tablet Take 20 mg by mouth every other day.    . senna (SENOKOT) 8.6 MG tablet Take 5 tablets (43 mg total) by mouth daily. (Patient taking differently: Take 5 tablets by mouth 2 (two) times daily.)    . gabapentin (NEURONTIN) 300 MG capsule Take 1 capsule (300 mg total) by mouth 3 (three) times daily. (Patient taking differently: Take 300 mg by mouth 2 (two) times daily.) 60 capsule 3  . predniSONE (DELTASONE) 10 MG tablet Take 1 tablet (10 mg total) by mouth daily with breakfast. (Patient not taking: Reported on 04/03/2020) 90 tablet 1   Facility-Administered Medications Prior to Visit  Medication Dose Route Frequency Provider Last Rate Last Admin  . betamethasone acetate-betamethasone sodium phosphate (CELESTONE) injection 12 mg  12 mg Other Once Magnus Sinning, MD        Allergies  Allergen Reactions  . Codeine Other (See Comments)    Pains in abdominal area  . Escitalopram Itching  . Other     NOVACAINE     ITCHING Anti-depressants  . Oxycodone     Abdominal pain  . Paxil [Paroxetine Hcl] Itching  . Procaine Itching    ROS Review of Systems  Constitutional: Negative.   HENT: Positive for ear pain. Negative for dental problem, ear discharge, rhinorrhea, sinus pressure, sinus pain, sore throat and voice change.        Left neck pain at times tender to touch  Respiratory: Negative.   Cardiovascular: Negative.   Gastrointestinal: Positive for nausea. Negative for vomiting.  Musculoskeletal: Positive for arthralgias.       Pain in multiple joints      Objective:    Physical Exam Constitutional:      Appearance: Normal appearance.  HENT:     Right Ear: There is impacted cerumen.      Left Ear: Tympanic membrane, ear canal and external ear normal.  Neck:     Comments: Tenderness to left sternocleidomastoid on palpation. Cardiovascular:     Rate and Rhythm: Normal rate and regular rhythm.     Pulses: Normal pulses.     Heart sounds: Normal heart sounds.  Pulmonary:     Effort: Pulmonary effort is normal.     Breath sounds: Normal breath sounds.  Musculoskeletal:     Cervical back: Normal range of motion. Tenderness present.  Neurological:     Mental Status: She is alert.  Psychiatric:        Mood and Affect: Mood normal.        Behavior: Behavior normal.        Thought Content: Thought content normal.        Judgment: Judgment normal.     BP 112/77   Pulse 86   Temp 98 F (36.7 C)   Resp 20   Ht 5' 7.5" (1.715 m)   Wt 167 lb (75.8 kg)   SpO2 94%   BMI 25.77 kg/m  Wt Readings from Last 3 Encounters:  04/03/20 167 lb (75.8 kg)  02/01/20 173 lb (78.5 kg)  12/27/19 173 lb 4.8 oz (78.6 kg)     Health Maintenance Due  Topic Date Due  . COVID-19 Vaccine (1) Never done  . TETANUS/TDAP  Never done  . PNA vac Low Risk Adult (1 of 2 - PCV13) Never done    There are no preventive care reminders to display for this patient.  No results found for: TSH Lab Results  Component Value Date   WBC 7.5 03/29/2020   HGB 12.6 03/29/2020   HCT 37.8 03/29/2020   MCV 96 03/29/2020   PLT 200 03/29/2020   Lab Results  Component Value Date   NA 141 03/29/2020   K 4.5 03/29/2020   CO2 20 03/29/2020   GLUCOSE 112 (H) 03/29/2020   BUN 13 03/29/2020   CREATININE 0.65 03/29/2020   BILITOT 0.3 03/29/2020   ALKPHOS 74 03/29/2020   AST 19 03/29/2020   ALT 16 03/29/2020   PROT 6.6 03/29/2020   ALBUMIN 4.1 03/29/2020   CALCIUM 9.3 03/29/2020   ANIONGAP 12 08/23/2019   Lab Results  Component Value Date   CHOL 155 03/29/2020   Lab Results  Component Value Date   HDL 49 03/29/2020   Lab Results  Component Value Date   LDLCALC 80 03/29/2020   Lab Results   Component Value Date   TRIG 150 (H) 03/29/2020   Lab Results  Component Value Date   CHOLHDL 3.2 03/29/2020   Lab Results  Component Value Date   HGBA1C 5.0 03/29/2020      Assessment & Plan:   Problem List Items Addressed This Visit      Cardiovascular and Mediastinum   Benign essential hypertension    -BP well controlled today -no change in meds      Dysrhythmia    -last EKG showed NSR -takes digoxin and has been doing so for over 30 years, cardiology continued digoxin since she has had no issues -she states no digoxin level checked many years and she ahs nausea -check digoxin        Musculoskeletal and Integument   DDD (degenerative disc disease), lumbosacral    -had kyphoplasty x2 in 2021 -will get DEXA scan      Relevant Orders   DG Bone Density   Polyarthritis    -she has pain in multiple joints as well as her feet and lower back -had kyphoplasty x2 in 2021 -will check autoimmune panel today- ESR, CRP, ANA, RF      Relevant Orders   Rheumatoid Factor   Antinuclear Antib (ANA)   Sed Rate (ESR)   C-reactive protein   DG Bone Density     Other   Hyperlipidemia    Lab Results  Component Value Date   CHOL 155 03/29/2020   HDL 49 03/29/2020   LDLCALC 80 03/29/2020   TRIG 150 (H) 03/29/2020   CHOLHDL 3.2 03/29/2020  -no change in medication today; continue pravastatin       Nausea    -nausea has been ongoing, and she has significant GI history -she has been taking digoxin for over 30 years, but she states it was started by a PCP and she has not had any digoxin levels monitored -will check digoxin level today      Relevant Orders   Digoxin level    Other Visit Diagnoses    Screening due    -  Primary   Relevant Orders   DG Bone Density      Meds ordered this encounter  Medications  . gabapentin (NEURONTIN) 300 MG capsule    Sig: Take 1 capsule (300 mg total) by mouth 3 (three) times daily.    Dispense:  60 capsule    Refill:  3     Follow-up: Return in about 3 months (around 07/04/2020) for Lab follow-up.    Noreene Larsson, NP

## 2020-04-03 NOTE — Patient Instructions (Signed)
We will draw labs today for your back pain and nausea.

## 2020-04-03 NOTE — Assessment & Plan Note (Addendum)
-  she has pain in multiple joints as well as her feet and lower back -had kyphoplasty x2 in 2021 -will check autoimmune panel today- ESR, CRP, ANA, RF

## 2020-04-03 NOTE — Assessment & Plan Note (Signed)
Lab Results  Component Value Date   CHOL 155 03/29/2020   HDL 49 03/29/2020   LDLCALC 80 03/29/2020   TRIG 150 (H) 03/29/2020   CHOLHDL 3.2 03/29/2020  -no change in medication today; continue pravastatin

## 2020-04-03 NOTE — Assessment & Plan Note (Signed)
-  last EKG showed NSR -takes digoxin and has been doing so for over 30 years, cardiology continued digoxin since she has had no issues -she states no digoxin level checked many years and she ahs nausea -check digoxin

## 2020-04-03 NOTE — Assessment & Plan Note (Signed)
-  BP well controlled today -no change in meds

## 2020-04-04 DIAGNOSIS — C44329 Squamous cell carcinoma of skin of other parts of face: Secondary | ICD-10-CM | POA: Diagnosis not present

## 2020-04-04 NOTE — Progress Notes (Signed)
Most of her labs have not resulted yet, but her digoxin level was not elevated, so that is not causing her nausea.

## 2020-04-04 NOTE — Procedures (Signed)
Lumbar Epidural Steroid Injection - Interlaminar Approach with Fluoroscopic Guidance  Patient: Zoe [name]      Date of Birth: 1935-06-16 MRN: [id number] PCP: Noreene Larsson, NP      Visit Date: 02/20/2020   Universal Protocol:     Consent Given By: the patient  Position: PRONE  Additional Comments: Vital signs were monitored before and after the procedure. Patient was prepped and draped in the usual sterile fashion. The correct patient, procedure, and site was verified.   Injection Procedure Details:   Procedure diagnoses: Lumbar radiculopathy [M54.16]   Meds Administered:  Meds ordered this encounter  Medications  . betamethasone acetate-betamethasone sodium phosphate (CELESTONE) injection 12 mg     Laterality: Left  Location/Site:  L4-L5  Needle: 3.5 in., 20 ga. Tuohy  Needle Placement: Paramedian epidural  Findings:   -Comments: Excellent flow of contrast into the epidural space.  Procedure Details: Using a paramedian approach from the side mentioned above, the region overlying the inferior lamina was localized under fluoroscopic visualization and the soft tissues overlying this structure were infiltrated with 4 ml. of 1% Lidocaine without Epinephrine. The Tuohy needle was inserted into the epidural space using a paramedian approach.   The epidural space was localized using loss of resistance along with counter oblique bi-planar fluoroscopic views.  After negative aspirate for air, blood, and CSF, a 2 ml. volume of Isovue-250 was injected into the epidural space and the flow of contrast was observed. Radiographs were obtained for documentation purposes.    The injectate was administered into the level noted above.   Additional Comments:  The patient tolerated the procedure well Dressing: 2 x 2 sterile gauze and Band-Aid    Post-procedure details: Patient was observed during the procedure. Post-procedure instructions were reviewed.  Patient left the  clinic in stable condition.

## 2020-04-04 NOTE — Progress Notes (Signed)
Zoe [name] - 85 y.o. female MRN [id number]  Date of birth: 1935-02-04  Office Visit Note: Visit Date: 02/20/2020 PCP: Noreene Larsson, NP Referred by: Noreene Larsson, NP  Subjective: Chief Complaint  Patient presents with  . Lower Back - Pain  . Middle Back - Pain   HPI:  Zoe [name] is a 85 y.o. female who comes in today at the request of Dr. Jean Rosenthal for planned Left L4-L5 Lumbar epidural steroid injection with fluoroscopic guidance.  The patient has failed conservative care including home exercise, medications, time and activity modification.  This injection will be diagnostic and hopefully therapeutic.  Please see requesting physician notes for further details and justification.  I did have a pretty extensive note on her from the last visit.  Her case is very complicated with fibromyalgia and multiple drug intolerances.  She reports the last injection which was a diagnostic medial branch block at L3-4 was really not beneficial at all.  Unfortunately I have these very long talks with folks about diagnostic blocks and the potential for radiofrequency ablation is a longer term relief and somehow just even with most people in general it just seems to get lost in translation even despite multiple attempts of explaining this.  Nonetheless with long talk today she did not get any relief from those diagnostic blocks.  We are going to try diagnostic and hopefully therapeutic L4-5 interlaminar injection today.  She is going to let us know how she does in the next few weeks at least 7 to 10 days from now and if no relief we probably need to update MRI and probably regroup with physical therapy.  She can also return to Dr. Ninfa Linden if needed.   ROS Otherwise per HPI.  Assessment & Plan: Visit Diagnoses:    ICD-10-CM   1. Lumbar radiculopathy  M54.16 XR C-ARM NO REPORT    Epidural Steroid injection    betamethasone acetate-betamethasone sodium phosphate (CELESTONE) injection 12  mg    Plan: No additional findings.   Meds & Orders:  Meds ordered this encounter  Medications  . betamethasone acetate-betamethasone sodium phosphate (CELESTONE) injection 12 mg    Orders Placed This Encounter  Procedures  . XR C-ARM NO REPORT  . Epidural Steroid injection    Follow-up: Return if symptoms worsen or fail to improve.   Procedures: No procedures performed  Lumbar Epidural Steroid Injection - Interlaminar Approach with Fluoroscopic Guidance  Patient: Zoe [name]      Date of Birth: 06-Jul-1935 MRN: [id number] PCP: Noreene Larsson, NP      Visit Date: 02/20/2020   Universal Protocol:     Consent Given By: the patient  Position: PRONE  Additional Comments: Vital signs were monitored before and after the procedure. Patient was prepped and draped in the usual sterile fashion. The correct patient, procedure, and site was verified.   Injection Procedure Details:   Procedure diagnoses: Lumbar radiculopathy [M54.16]   Meds Administered:  Meds ordered this encounter  Medications  . betamethasone acetate-betamethasone sodium phosphate (CELESTONE) injection 12 mg     Laterality: Left  Location/Site:  L4-L5  Needle: 3.5 in., 20 ga. Tuohy  Needle Placement: Paramedian epidural  Findings:   -Comments: Excellent flow of contrast into the epidural space.  Procedure Details: Using a paramedian approach from the side mentioned above, the region overlying the inferior lamina was localized under fluoroscopic visualization and the soft tissues overlying this structure were infiltrated with 4 ml. of 1%  Lidocaine without Epinephrine. The Tuohy needle was inserted into the epidural space using a paramedian approach.   The epidural space was localized using loss of resistance along with counter oblique bi-planar fluoroscopic views.  After negative aspirate for air, blood, and CSF, a 2 ml. volume of Isovue-250 was injected into the epidural space and the flow of  contrast was observed. Radiographs were obtained for documentation purposes.    The injectate was administered into the level noted above.   Additional Comments:  The patient tolerated the procedure well Dressing: 2 x 2 sterile gauze and Band-Aid    Post-procedure details: Patient was observed during the procedure. Post-procedure instructions were reviewed.  Patient left the clinic in stable condition.    Clinical History: MRI THORACIC AND LUMBAR SPINE WITHOUT CONTRAST  TECHNIQUE: Multiplanar and multiecho pulse sequences of the thoracic and lumbar spine were obtained without intravenous contrast.  COMPARISON:  10/13/2019 MRI thoracic spine and prior. 10/20/2019 MRI lumbar spine.  FINDINGS: MRI THORACIC SPINE FINDINGS  Alignment: Minimal grade 1 stepwise anterolisthesis at the T10-L1 levels.  Vertebrae: Chronic T6 and T7 superior endplate deformities with mild height loss are unchanged. Sequela of kyphoplasty and chronic mild height loss at the T11 and T12 levels. No new fracture deformity. No significant retropulsion.  Cord:  Normal signal and morphology.  Paraspinal and other soft tissues: Negative.  Disc levels:  Multilevel desiccation. Mild T9-10 disc space loss. Minimal posterior disc bulge at the T3-T7 levels. Mild T9-10 disc bulge. Minimal disc bulge at the T10-L1 levels. Multilevel facet degenerative spurring.  Partial effacement of the ventral CSF containing spaces at the T9-10 level. Patent spinal canal and neural foramen at the remaining thoracic levels.  MRI LUMBAR SPINE FINDINGS  Segmentation:  Standard.  Alignment: Stepwise grade 1 retrolisthesis at the L1-4 levels. Straightening of lordosis.  Vertebrae: Sequela of T11-L1 kyphoplasties with mild chronic height loss. Chronic superior L4 endplate deformity is unchanged. Multilevel Schmorl's node formation and Modic type 2 endplate degenerative changes.  Conus medullaris and  cauda equina: Conus extends to the L1 level. Conus and cauda equina appear normal.  Disc levels: Multilevel desiccation, disc space loss and osteophytosis.  L1-2: Mild disc bulge and bilateral facet hypertrophy. Sequela of left laminectomy. Patent spinal canal. Mild bilateral neural foraminal narrowing.  L2-3: Disc bulge with superimposed right paracentral protrusion. Bilateral facet hypertrophy. Sequela of left laminectomy. Patent spinal canal. Bilateral neural foraminal narrowing.  L3-4: Disc bulge with ligamentum flavum and bilateral facet hypertrophy. Redemonstration of 8 mm posterior midline synovial cyst with prominent overlying epidural fat (15:20). Mild spinal canal and moderate to severe bilateral neural foraminal narrowing, unchanged.  L4-5: Disc bulge, ligamentum flavum and bilateral facet hypertrophy. Patent spinal canal. Moderate to severe bilateral neural foraminal narrowing, unchanged.  L5-S1: Disc bulge with shallow central protrusion. Bilateral facet hypertrophy. Patent spinal canal. Moderate bilateral neural foraminal narrowing, unchanged.  Paraspinal and other soft tissues: Negative.  IMPRESSION: Chronic thoracolumbar fracture deformities and sequela of multilevel kyphoplasty, unchanged. No acute/subacute fracture deformity.  Multilevel thoracolumbar spondylosis, grossly unchanged.  8 mm posterior midline L3-4 synovial cyst with mild spinal canal narrowing is similar to prior exam.  Moderate to severe bilateral neural foraminal narrowing at the L3-S1 levels, unchanged.   Electronically Signed   By: Primitivo Gauze M.D.   On: 12/09/2019 10:30     Objective:  VS:  HT:    WT:   BMI:     BP:117/80  HR:92bpm  TEMP: ( )  RESP:  Physical  Exam Vitals and nursing note reviewed.  Constitutional:      General: She is not in acute distress.    Appearance: Normal appearance. She is not ill-appearing.  HENT:     Head: Normocephalic  and atraumatic.     Right Ear: External ear normal.     Left Ear: External ear normal.  Eyes:     Extraocular Movements: Extraocular movements intact.  Cardiovascular:     Rate and Rhythm: Normal rate.     Pulses: Normal pulses.  Pulmonary:     Effort: Pulmonary effort is normal. No respiratory distress.  Abdominal:     General: There is no distension.     Palpations: Abdomen is soft.  Musculoskeletal:        General: Tenderness present.     Cervical back: Neck supple.     Right lower leg: No edema.     Left lower leg: No edema.     Comments: Patient has good distal strength with no pain over the greater trochanters.  No clonus or focal weakness.  Skin:    Findings: No erythema, lesion or rash.  Neurological:     General: No focal deficit present.     Mental Status: She is alert and oriented to person, place, and time.     Sensory: No sensory deficit.     Motor: No weakness or abnormal muscle tone.     Coordination: Coordination normal.  Psychiatric:        Mood and Affect: Mood normal.        Behavior: Behavior normal.      Imaging: No results found.

## 2020-04-05 LAB — SEDIMENTATION RATE: Sed Rate: 12 mm/hr (ref 0–40)

## 2020-04-05 LAB — ANA: Anti Nuclear Antibody (ANA): NEGATIVE

## 2020-04-05 LAB — C-REACTIVE PROTEIN: CRP: <1 mg/L (ref 0–10)

## 2020-04-05 LAB — RHEUMATOID FACTOR: Rheumatoid fact SerPl-aCnc: <10 IU/mL (ref <14.0)

## 2020-04-05 LAB — DIGOXIN LEVEL: Digoxin, Serum: <0.4 ng/mL — ABNORMAL LOW (ref 0.5–0.9)

## 2020-04-10 ENCOUNTER — Telehealth: Payer: Self-pay

## 2020-04-10 NOTE — Telephone Encounter (Signed)
Is she having allergy symptoms and wants a steroid shot, or does she want to see an allergist for allergen-specific desensitization? If she wants a steroid shot, we can hook her up with a visit. If she wants shot from an allergist, let me know and I can send in a referral.

## 2020-04-10 NOTE — Telephone Encounter (Signed)
Pt would like to speak with you about an allergy injection. She used to get these and wants to discuss getting them again. Please advise.

## 2020-04-11 ENCOUNTER — Ambulatory Visit
Admission: RE | Admit: 2020-04-11 | Discharge: 2020-04-11 | Disposition: A | Discharge status: Home / Self Care | Payer: PPO | Source: Ambulatory Visit | Attending: Interventional Radiology | Admitting: Interventional Radiology

## 2020-04-11 ENCOUNTER — Encounter: Payer: Self-pay | Admitting: *Deleted

## 2020-04-11 DIAGNOSIS — S32000A Wedge compression fracture of unspecified lumbar vertebra, initial encounter for closed fracture: Secondary | ICD-10-CM

## 2020-04-11 DIAGNOSIS — Z9889 Other specified postprocedural states: Secondary | ICD-10-CM | POA: Diagnosis not present

## 2020-04-11 DIAGNOSIS — S32050A Wedge compression fracture of fifth lumbar vertebra, initial encounter for closed fracture: Secondary | ICD-10-CM | POA: Diagnosis not present

## 2020-04-11 HISTORY — PX: IR RADIOLOGIST EVAL & MGMT: IMG5224

## 2020-04-11 NOTE — Progress Notes (Signed)
Chief Complaint: Patient was seen in consultation today for persistent chronic back pain at the request of Momina Hunton K  Referring Physician(s): Shelly Shoultz K  History of Present Illness: Zoe [name] is a 85 y.o. female who initially presented this past July with persistent pain from a subacute T11 compression fracture.She underwent percutaneous cement augmentation with kyphoplasty performed by my partner, Dr. Vernard Gambles, on 08/23/2019.  Unfortunately, following the procedure she had a significant exacerbation of her pain which she reports as a terrible experience. Her husband subsequently underwent open heart surgery and her daughter was in town and had to help with both of them as she was almost completely incapacitated for the first 1-2 weeks following her kyphoplasty procedure.  Follow-up MR imaging demonstrated new compression fractures at T7, T12 and L1.  The T12 and L1 fractures were felt to be symptomatic on physical exam and so the patient underwent T12 and L1 cement augmentation with kyphoplasty on 11/24/2019.  Unfortunately, she has continued to experience intermittent significant generalized back pain which became severe enough to recontact my office recently.  We got another MRI scan which now shows a 2 subacute compression fracture of the superior endplate of L5 with minimal height loss.  She presents today with her husband for clinical evaluation.  She reports that her back pain is significant and near constant when standing or walking.  She experiences great relief when extending backwards, bending over to touch her toes, or laying down.  She does not understand why she can perform these maneuvers and feels so much better in the setting of fractures.  She denies pain specifically in the lower back only and endorses pain in the mid and lower back with significant radiation in the right along the 12th rib.  This is similar to her prior symptoms.  She denies  weakness, paresthesias or other new symptoms.  Past Medical History:  Diagnosis Date  . Abdominal discomfort 11-02-12   suspected ?UTI- PCP MD placed on Cipro  . Anxiety   . Arthritis   . Asthma   . Cancer ([place])    skin CA  . Chronic constipation   . Depression   . Dysrhythmia    tachycardia  . Environmental allergies   . Fibromyalgia   . GERD (gastroesophageal reflux disease)   . Hemorrhoids   . Hypercholesteremia   . Hypertension     Past Surgical History:  Procedure Laterality Date  . ANAL RECTAL MANOMETRY N/A 11/28/2013   Procedure: ANO RECTAL MANOMETRY;  Surgeon: Leighton Ruff, MD;  Location: [facility];  Service: Endoscopy;  Laterality: N/A;  . APPENDECTOMY    . BACK SURGERY     X2   . CHOLECYSTECTOMY    . COLONOSCOPY  03/29/03  . COLONOSCOPY N/A 12/09/2013   Procedure: COLONOSCOPY;  Surgeon: Rogene Houston, MD;  Location: [facility];  Service: Endoscopy;  Laterality: N/A;  730  . COLONOSCOPY N/A 02/04/2017   Procedure: COLONOSCOPY;  Surgeon: Rogene Houston, MD;  Location: [facility];  Service: Endoscopy;  Laterality: N/A;  930  . EYE SURGERY     bilateral cataract removal  . IR KYPHO LUMBAR INC FX REDUCE BONE BX UNI/BIL CANNULATION INC/IMAGING  11/24/2019  . IR KYPHO THORACIC WITH BONE BIOPSY  08/23/2019  . IR KYPHO THORACIC WITH BONE BIOPSY  11/24/2019  . IR RADIOLOGIST EVAL & MGMT  08/03/2019  . IR RADIOLOGIST EVAL & MGMT  09/29/2019  . IR RADIOLOGIST EVAL & MGMT  10/18/2019  .  IR RADIOLOGIST EVAL & MGMT  04/11/2020  . SKIN CANCER EXCISION     RIGHT NECK    . TONSILLECTOMY     T+A  . TOTAL HIP ARTHROPLASTY Right 09/14/2012   Procedure: RIGHT TOTAL HIP ARTHROPLASTY ANTERIOR APPROACH and RIGHT KNEE STEROID INJECTION;  Surgeon: Mcarthur Rossetti, MD;  Location: [facility];  Service: Orthopedics;  Laterality: Right;  . TOTAL HIP ARTHROPLASTY Left 11/05/2012   Procedure: LEFT TOTAL HIP ARTHROPLASTY ANTERIOR APPROACH;  Surgeon: Mcarthur Rossetti, MD;   Location: [facility];  Service: Orthopedics;  Laterality: Left;  . UMBILICAL HERNIA REPAIR  04/26/03   JENKINS  . UPPER GASTROINTESTINAL ENDOSCOPY  08/03/2009   NUR  . UPPER GASTROINTESTINAL ENDOSCOPY  03/29/2003   [facility] ED TCS    Allergies: Codeine, Escitalopram, Other, Oxycodone, Paxil [paroxetine hcl], and Procaine  Medications: Prior to Admission medications   Medication Sig Start Date End Date Taking? Authorizing Provider  ADVAIR DISKUS 250-50 MCG/DOSE AEPB Inhale 1 puff into the lungs 2 (two) times daily. 11/21/19   [provider]  albuterol (PROVENTIL HFA;VENTOLIN HFA) 108 (90 BASE) MCG/ACT inhaler Inhale 2 puffs into the lungs every 6 (six) hours as needed for wheezing or shortness of breath.    [provider]  allopurinol (ZYLOPRIM) 300 MG tablet Take 300 mg by mouth daily. 03/10/11   [provider]  ALPRAZolam Duanne Moron) 0.5 MG tablet Take 0.5 mg by mouth 2 (two) times daily as needed for anxiety.    [provider]  amLODipine (NORVASC) 2.5 MG tablet Take 2.5 mg by mouth 3 (three) times a week. 08/04/19   [provider]  aspirin 81 MG tablet Take 81 mg by mouth daily.    [provider]  Biotin 5000 MCG CAPS Take 5,000 mcg by mouth daily.    [provider]  calcium-vitamin D 250-100 MG-UNIT tablet Take 1 tablet by mouth 2 (two) times daily. Started 2 days ago    [provider]  diclofenac Sodium (VOLTAREN) 1 % GEL APPLY 2-4 GRAMS TOPICALLY FOUR TIMES DAILY 12/27/19   Mcarthur Rossetti, MD  digoxin (LANOXIN) 0.125 MG tablet Take 125 mcg by mouth daily. 08/04/19   [provider]  doxepin (SINEQUAN) 100 MG capsule Take 100 mg by mouth at bedtime.    [provider]  fexofenadine (ALLEGRA) 60 MG tablet Take 60 mg by mouth daily.     [provider]  gabapentin (NEURONTIN) 300 MG capsule Take 1 capsule (300 mg total) by mouth 3 (three) times daily. 04/03/20   Noreene Larsson, NP   ipratropium-albuterol (DUONEB) 0.5-2.5 (3) MG/3ML SOLN Inhale 3 mLs into the lungs every 4 (four) hours as needed (SOB).  10/12/18   [provider]  lubiprostone (AMITIZA) 24 MCG capsule Take 1 capsule (24 mcg total) by mouth 2 (two) times daily with a meal. 02/07/20   Noreene Larsson, NP  Multiple Vitamin (MULTIVITAMIN) tablet Take 1 tablet by mouth daily.    [provider]  naproxen (NAPROSYN) 500 MG tablet Take 500 mg by mouth 2 (two) times daily with a meal.     [provider]  omeprazole (PRILOSEC) 20 MG capsule Take 20 mg by mouth daily.    [provider]  ondansetron (ZOFRAN ODT) 8 MG disintegrating tablet Take 1 tablet (8 mg total) by mouth every 8 (eight) hours as needed for nausea or vomiting. 01/10/20   Noreene Larsson, NP  pravastatin (PRAVACHOL) 20 MG tablet Take 20 mg  by mouth every other day.    [provider]  senna (SENOKOT) 8.6 MG tablet Take 5 tablets (43 mg total) by mouth daily. Patient taking differently: Take 5 tablets by mouth 2 (two) times daily. 06/21/19   Rogene Houston, MD     Family History  Problem Relation Age of Onset  . Healthy Son   . Healthy Son   . Healthy Daughter   . Colon cancer Neg Hx     Social History   Socioeconomic History  . Marital status: Married    Spouse name: Not on file  . Number of children: Not on file  . Years of education: Not on file  . Highest education level: Not on file  Occupational History  . Not on file  Tobacco Use  . Smoking status: Current Some Day Smoker    Packs/day: 0.25    Years: 50.00    Pack years: 12.50    Types: Cigarettes    Last attempt to quit: 11/10/2018    Years since quitting: 1.4  . Smokeless tobacco: Never Used  . Tobacco comment: Hasn't smoked in 5-6 weeks  Vaping Use  . Vaping Use: Never used  Substance and Sexual Activity  . Alcohol use: Yes    Alcohol/week: 0.0 standard drinks    Comment: rare  . Drug use: No  . Sexual activity: Not  Currently    Birth control/protection: Post-menopausal  Other Topics Concern  . Not on file  Social History Narrative  . Not on file   Social Determinants of Health   Financial Resource Strain: Not on file  Food Insecurity: Not on file  Transportation Needs: Not on file  Physical Activity: Not on file  Stress: Not on file  Social Connections: Not on file    Review of Systems: A 12 point ROS discussed and pertinent positives are indicated in the HPI above.  All other systems are negative.  Review of Systems  Vital Signs: There were no vitals taken for this visit.  Physical Exam Constitutional:      Appearance: Normal appearance.  HENT:     Head: Normocephalic and atraumatic.  Eyes:     General: No scleral icterus. Cardiovascular:     Rate and Rhythm: Normal rate.  Pulmonary:     Effort: Pulmonary effort is normal.  Abdominal:     General: Abdomen is flat.     Palpations: Abdomen is soft.  Musculoskeletal:        General: No swelling.     Comments: No significant TTP over the L5 spinous process.  Generalized TTP along the spinous processes from T11-L4 and in the right paraspinal muscles.   Skin:    General: Skin is warm and dry.  Neurological:     Mental Status: She is alert and oriented to person, place, and time.  Psychiatric:        Mood and Affect: Mood normal.        Behavior: Behavior normal.      Imaging: MR THORACIC SPINE WO CONTRAST  Result Date: 03/29/2020 CLINICAL DATA:  Chronic back pain.  Acute bilateral thoracic pain. EXAM: MRI THORACIC AND LUMBAR SPINE WITHOUT CONTRAST TECHNIQUE: Multiplanar and multiecho pulse sequences of the thoracic and lumbar spine were obtained without intravenous contrast. COMPARISON:  12/09/2019 FINDINGS: MRI THORACIC SPINE FINDINGS Alignment:  Unremarkable. Vertebrae: Remote T6, T11, and T12 compression fractures. Chronic Schmorl's node at the T7 superior endplate. Vertebroplasty at T11 and T12 remotely. Slight marrow edema  at  the T12 level which is improved-residual likely from altered weight-bearing rather than unhealed fracture. No marrow edema seen at the other levels. No acute fracture. Paraspinal and other soft tissues: No perispinal mass or inflammation seen. Disc levels: Ordinary disc desiccation and narrowing. Mild lower thoracic facet spurring. Chronic T9-10 and T11-12 moderate right foraminal narrowing. MRI LUMBAR SPINE FINDINGS Segmentation:  5 lumbar type vertebrae Alignment:  Physiologic. Vertebrae: L5 compression fracture with marrow edema around a horizontal fracture plane. Superior endplate depression is approximately 10% when compared to the posterior and intact cortex. No retropulsion. No paravertebral edema. Remote L4 compression fracture with mild height loss and no edema. Conus medullaris and cauda equina: Conus extends to the L1 level. Conus and cauda equina appear normal. Paraspinal and other soft tissues: Negative Disc levels: T12- L1: Mild disc bulging.  No neural impingement L1-L2: Disc collapse and endplate degeneration with ventral spurring. No impingement L2-L3: Disc narrowing and endplate degeneration with bulging and small right paracentral protrusion. Noncompressive right subarticular recess narrowing L3-L4: Disc narrowing and bulging with endplate spurring. Degenerative facet spurring on both sides. Moderate bilateral foraminal narrowing. Subarticular recess narrowing greater on the right. A ligamentum flavum ganglion is present posteriorly at this level. L4-L5: Disc narrowing and bulging. Mild facet and endplate spurring. Moderate left foraminal stenosis. L5-S1:Disc narrowing and bulging with degenerative facet spurring. Left more than right moderate foraminal impingement. IMPRESSION: MR THORACIC SPINE IMPRESSION 1. No acute finding. 2. Remote compression fractures with T11 and T12 vertebroplasty. There has been healing at the T12 level since November 2021. 3. Ordinary degenerative changes without  cord impingement. MR LUMBAR SPINE IMPRESSION 1. Subacute L5 compression fracture with mild height loss and no retropulsion. 2. Degenerative changes as previously described. Electronically Signed   By: Monte Fantasia M.D.   On: 03/29/2020 05:28   MR LUMBAR SPINE WO CONTRAST  Result Date: 03/29/2020 CLINICAL DATA:  Chronic back pain.  Acute bilateral thoracic pain. EXAM: MRI THORACIC AND LUMBAR SPINE WITHOUT CONTRAST TECHNIQUE: Multiplanar and multiecho pulse sequences of the thoracic and lumbar spine were obtained without intravenous contrast. COMPARISON:  12/09/2019 FINDINGS: MRI THORACIC SPINE FINDINGS Alignment:  Unremarkable. Vertebrae: Remote T6, T11, and T12 compression fractures. Chronic Schmorl's node at the T7 superior endplate. Vertebroplasty at T11 and T12 remotely. Slight marrow edema at the T12 level which is improved-residual likely from altered weight-bearing rather than unhealed fracture. No marrow edema seen at the other levels. No acute fracture. Paraspinal and other soft tissues: No perispinal mass or inflammation seen. Disc levels: Ordinary disc desiccation and narrowing. Mild lower thoracic facet spurring. Chronic T9-10 and T11-12 moderate right foraminal narrowing. MRI LUMBAR SPINE FINDINGS Segmentation:  5 lumbar type vertebrae Alignment:  Physiologic. Vertebrae: L5 compression fracture with marrow edema around a horizontal fracture plane. Superior endplate depression is approximately 10% when compared to the posterior and intact cortex. No retropulsion. No paravertebral edema. Remote L4 compression fracture with mild height loss and no edema. Conus medullaris and cauda equina: Conus extends to the L1 level. Conus and cauda equina appear normal. Paraspinal and other soft tissues: Negative Disc levels: T12- L1: Mild disc bulging.  No neural impingement L1-L2: Disc collapse and endplate degeneration with ventral spurring. No impingement L2-L3: Disc narrowing and endplate degeneration with  bulging and small right paracentral protrusion. Noncompressive right subarticular recess narrowing L3-L4: Disc narrowing and bulging with endplate spurring. Degenerative facet spurring on both sides. Moderate bilateral foraminal narrowing. Subarticular recess narrowing greater on the right. A ligamentum flavum ganglion is  present posteriorly at this level. L4-L5: Disc narrowing and bulging. Mild facet and endplate spurring. Moderate left foraminal stenosis. L5-S1:Disc narrowing and bulging with degenerative facet spurring. Left more than right moderate foraminal impingement. IMPRESSION: MR THORACIC SPINE IMPRESSION 1. No acute finding. 2. Remote compression fractures with T11 and T12 vertebroplasty. There has been healing at the T12 level since November 2021. 3. Ordinary degenerative changes without cord impingement. MR LUMBAR SPINE IMPRESSION 1. Subacute L5 compression fracture with mild height loss and no retropulsion. 2. Degenerative changes as previously described. Electronically Signed   By: Monte Fantasia M.D.   On: 03/29/2020 05:28   IR Radiologist Eval & Mgmt  Result Date: 04/11/2020 Please refer to notes tab for details about interventional procedure. (Op Note)   Labs:  CBC: Recent Labs    08/23/19 0805 11/24/19 1025 01/10/20 1141 03/29/20 1146  WBC 6.6 6.8 8.3 7.5  HGB 12.9 12.5 14.0 12.6  HCT 39.9 37.9 39.9 37.8  PLT 186 172 195 200    COAGS: Recent Labs    08/23/19 0805 11/24/19 1025  INR 1.0 1.0    BMP: Recent Labs    08/23/19 0805 01/10/20 1141 03/29/20 1146  NA 141 143 141  K 3.9 4.1 4.5  CL 106 104 104  CO2 23 24 20   GLUCOSE 121* 133* 112*  BUN 15 26 13   CALCIUM 9.6 9.4 9.3  CREATININE 0.99 0.83 0.65  GFRNONAA 53* 66  --   GFRAA >60 76  --     LIVER FUNCTION TESTS: Recent Labs    01/10/20 1141 03/29/20 1146  BILITOT 0.4 0.3  AST 18 19  ALT 17 16  ALKPHOS 74 74  PROT 6.7 6.6  ALBUMIN 4.4 4.1    TUMOR MARKERS: No results for input(s):  AFPTM, CEA, CA199, CHROMGRNA in the last 8760 hours.  Assessment and Plan:  Persistent diffuse back pain slightly worse on the right than the left and extending from approximately T11-L4.  She has undergone prior cement augmentation with kyphoplasty at T11 and T12 without significant clinical improvement.  Currently, she has a subacute mild compression fracture of the superior endplate of L5, but her clinical symptoms do not seem to localize to this region.  Her symptoms are quite distressing and nearly constant when she is standing or walking.  She feels relief only while sitting or laying down.  We discussed the utility of considering additional cement augmentation of her L5 compression fracture but feel that this is unlikely to offer clinical benefit.  Given the diffuse and nonspecific nature of her symptoms, I suggested that a combination of interlaminar epidural steroid injection and physical therapy may be of utility.  She reports that Dr. Ernestina Patches has offered physical therapy in the past and knows of a good practice.  She will follow up with him for that.  In the meantime, I will attempt to get her in at the spine center for an epidural steroid injection.  1.)  Please schedule for interlaminar L4-L5 epidural steroid injection at [facility] imaging.  Ideally this coming Friday when I am there to give her the injection.  If there are no available slots, one of my partners can treat her next week.   Electronically Signed: Criselda Peaches 04/11/2020, 5:06 PM   I spent a total of 15 Minutes in face to face in clinical consultation, greater than 50% of which was counseling/coordinating care for back pain.

## 2020-04-12 ENCOUNTER — Telehealth: Payer: Self-pay

## 2020-04-12 ENCOUNTER — Other Ambulatory Visit: Payer: Self-pay | Admitting: Orthopaedic Surgery

## 2020-04-12 ENCOUNTER — Other Ambulatory Visit: Payer: Self-pay | Admitting: Nurse Practitioner

## 2020-04-12 DIAGNOSIS — M545 Low back pain, unspecified: Secondary | ICD-10-CM

## 2020-04-12 MED ORDER — TRIAMCINOLONE ACETONIDE 40 MG/ML IJ SUSP: 40.0000 mg | Freq: Once | INTRAMUSCULAR | 0 refills | Status: AC

## 2020-04-12 NOTE — Telephone Encounter (Signed)
Referral placed in chart, called [facility] and informed them it will be sent to them soon

## 2020-04-12 NOTE — Addendum Note (Signed)
Addended by: Robyne Peers on: 04/12/2020 01:15 PM   Modules accepted: Orders

## 2020-04-12 NOTE — Telephone Encounter (Signed)
c 

## 2020-04-12 NOTE — Telephone Encounter (Signed)
Pt states around allergy season she always went in and got a one time injection of kenalog and it has always worked well. I told pt we do not have that in office, but we do have depo medrol and she says that never works and could we get the kenalog.

## 2020-04-12 NOTE — Telephone Encounter (Signed)
[place] from [facility] called regarding a referral they received she is requesting a order for epidural injections to be sent over  call back:[phone] Fax:[phone]

## 2020-04-12 NOTE — Telephone Encounter (Signed)
Sure

## 2020-04-12 NOTE — Telephone Encounter (Signed)
Pt states she started reading the side effects on the Kenalog and now she does not want to get it, but wants depo medrol instead. However she is getting a steroid/pain injection for her back tomorrow so she will have to hold off on getting the depo as of now.

## 2020-04-12 NOTE — Telephone Encounter (Signed)
I sent the kenalog to [name], and we can do the injection as a nurse's visit. We wouldn't use it enough to buy a brick of 25 vials, so we don't keep it on hand. That is used more for joint injections, and that is why we don't keep it on hand.

## 2020-04-12 NOTE — Telephone Encounter (Signed)
Is this ok?

## 2020-04-13 ENCOUNTER — Ambulatory Visit
Admission: RE | Admit: 2020-04-13 | Discharge: 2020-04-13 | Disposition: A | Discharge status: Home / Self Care | Payer: PPO | Source: Ambulatory Visit | Attending: Orthopaedic Surgery | Admitting: Orthopaedic Surgery

## 2020-04-13 ENCOUNTER — Ambulatory Visit (HOSPITAL_COMMUNITY)
Admission: RE | Admit: 2020-04-13 | Discharge: 2020-04-13 | Disposition: A | Discharge status: Home / Self Care | Payer: PPO | Source: Ambulatory Visit | Attending: Nurse Practitioner | Admitting: Nurse Practitioner

## 2020-04-13 ENCOUNTER — Other Ambulatory Visit: Payer: Self-pay

## 2020-04-13 DIAGNOSIS — Z780 Asymptomatic menopausal state: Secondary | ICD-10-CM | POA: Diagnosis not present

## 2020-04-13 DIAGNOSIS — M5137 Other intervertebral disc degeneration, lumbosacral region: Secondary | ICD-10-CM | POA: Diagnosis not present

## 2020-04-13 DIAGNOSIS — M130 Polyarthritis, unspecified: Secondary | ICD-10-CM | POA: Diagnosis not present

## 2020-04-13 DIAGNOSIS — M47817 Spondylosis without myelopathy or radiculopathy, lumbosacral region: Secondary | ICD-10-CM | POA: Diagnosis not present

## 2020-04-13 DIAGNOSIS — Z13820 Encounter for screening for osteoporosis: Secondary | ICD-10-CM | POA: Diagnosis not present

## 2020-04-13 DIAGNOSIS — M810 Age-related osteoporosis without current pathological fracture: Secondary | ICD-10-CM | POA: Diagnosis not present

## 2020-04-13 DIAGNOSIS — Z139 Encounter for screening, unspecified: Secondary | ICD-10-CM

## 2020-04-13 DIAGNOSIS — M545 Low back pain, unspecified: Secondary | ICD-10-CM

## 2020-04-13 MED ORDER — IOPAMIDOL (ISOVUE-M 200) INJECTION 41%
1.0000 mL | Freq: Once | INTRAMUSCULAR | Status: AC
  Administered 2020-04-13: 1 mL via EPIDURAL

## 2020-04-13 MED ORDER — METHYLPREDNISOLONE ACETATE 40 MG/ML INJ SUSP (RADIOLOG
120.0000 mg | Freq: Once | INTRAMUSCULAR | Status: AC
  Administered 2020-04-13: 120 mg via EPIDURAL

## 2020-04-13 NOTE — Discharge Instructions (Signed)

## 2020-04-13 NOTE — Progress Notes (Signed)
She is taking a calcium and Vit D supplement, so that is great. Since she has osteoporosis-related fractures, I am going to try to get her on Prolia injections every 6 months. I am talking to the drug rep to get her approved. If that doesn't work, we will try an oral medication, but the Prolia is just a twice a year injection that helps make her bones stronger.

## 2020-04-17 ENCOUNTER — Other Ambulatory Visit: Payer: Self-pay | Admitting: Nurse Practitioner

## 2020-04-17 MED ORDER — ALENDRONATE SODIUM 70 MG PO TABS: 70.0000 mg | ORAL_TABLET | ORAL | 11 refills | Status: DC

## 2020-04-17 MED ORDER — ALLOPURINOL 300 MG PO TABS: 300.0000 mg | ORAL_TABLET | Freq: Every day | ORAL | 1 refills | Status: DC

## 2020-04-17 NOTE — Progress Notes (Signed)
Sent in allopurinol for gout.  Also, sent in weekly alendronate for osteoporosis. She should avoid lying down for 30 minutes after taking the medicine to avoid ulcers in her esophagus.

## 2020-04-18 ENCOUNTER — Encounter: Payer: Self-pay | Admitting: Physician Assistant

## 2020-04-19 ENCOUNTER — Other Ambulatory Visit: Payer: Self-pay

## 2020-04-19 DIAGNOSIS — M47816 Spondylosis without myelopathy or radiculopathy, lumbar region: Secondary | ICD-10-CM

## 2020-04-19 MED ORDER — GABAPENTIN 300 MG PO CAPS: 300.0000 mg | ORAL_CAPSULE | Freq: Three times a day (TID) | ORAL | 2 refills | Status: DC

## 2020-05-09 DIAGNOSIS — L858 Other specified epidermal thickening: Secondary | ICD-10-CM | POA: Diagnosis not present

## 2020-05-09 DIAGNOSIS — L570 Actinic keratosis: Secondary | ICD-10-CM | POA: Diagnosis not present

## 2020-05-09 DIAGNOSIS — L821 Other seborrheic keratosis: Secondary | ICD-10-CM | POA: Diagnosis not present

## 2020-05-09 DIAGNOSIS — Z85828 Personal history of other malignant neoplasm of skin: Secondary | ICD-10-CM | POA: Diagnosis not present

## 2020-05-09 DIAGNOSIS — L820 Inflamed seborrheic keratosis: Secondary | ICD-10-CM | POA: Diagnosis not present

## 2020-05-16 ENCOUNTER — Other Ambulatory Visit: Payer: Self-pay | Admitting: Nurse Practitioner

## 2020-05-16 ENCOUNTER — Telehealth: Payer: Self-pay

## 2020-05-16 MED ORDER — ALPRAZOLAM 0.5 MG PO TABS: 0.5000 mg | ORAL_TABLET | Freq: Two times a day (BID) | ORAL | 2 refills | Status: DC | PRN

## 2020-05-16 NOTE — Telephone Encounter (Signed)
Sent in xanax

## 2020-05-16 NOTE — Telephone Encounter (Signed)
Pt informed

## 2020-05-16 NOTE — Telephone Encounter (Signed)
Pt called and states she needs a refill on her xanax and pharmacy told her she would need to contact our office for refill. Pt uses [facility].

## 2020-05-18 ENCOUNTER — Other Ambulatory Visit: Payer: Self-pay

## 2020-05-18 ENCOUNTER — Encounter: Payer: PPO | Attending: Physical Medicine & Rehabilitation | Admitting: Physical Medicine & Rehabilitation

## 2020-05-18 ENCOUNTER — Encounter: Payer: Self-pay | Admitting: Physical Medicine & Rehabilitation

## 2020-05-18 VITALS — BP 116/70 | HR 70 | Temp 97.8°F | Ht 67.5 in | Wt 169.2 lb

## 2020-05-18 DIAGNOSIS — M47817 Spondylosis without myelopathy or radiculopathy, lumbosacral region: Secondary | ICD-10-CM

## 2020-05-18 NOTE — Progress Notes (Signed)
Subjective:    Patient ID: Zoe [name], female    DOB: 1935/02/16, 85 y.o.   MRN: [id number]  HPI CC:  Low back pain 85 year old female referred by orthopedics for the evaluation of chronic low back pain.  The patient states her pain started after multiple vertebral fractures. The patient was referred to interventional radiology and underwent.  She has been on tramadol but states that she is unable to take stronger pain medications.  Her pain has been rated as severe but relieved with laying down.  In addition to her low back she also has some pain in her legs but this has been relieved after epidural injections.  She also has bilateral shoulder pain and states that she has a history of rotator cuff problems bilaterally.  She has been retired since 2000 she needs some assistance with meal prep household duties and shopping.  She is otherwise independent.  She at times has some troubles walking.  She also complains of leg weakness.  Her review of systems positive for constipation and this is made worse by any type of narcotic analgesics.  She has a history of sleep apnea as well as wheezing. She uses heating pads as well as diclofenac gel for pain she also takes 6-8 Tylenol's per day as well as 200 to 500 mg of naproxen per day she sleeps 8-9 at 9 hours sits 6 hours a day stands 1 to 2 hours a day and walks about 1 hour/day.  She gets minimal exercise limited to walking only  10/21 T12 and L1 Kyphoplasty, July 2021 T 11 Kyphoplasty  04/13/2020 L4-5 translaminar [facility]   No longer has leg pain since epidural injection 02/20/20 per Dr Ernestina Patches, but still has numb feeling behind the legs  Pain relieved by sitting , exacerbated by standing   Has not tried PT  Pain Inventory Average Pain 8 Pain Right Now 9 My pain is sharp, aching and throbbing  In the last 24 hours, has pain interfered with the following? General activity 7 Relation with others 7 Enjoyment of life 7 What  TIME of day is your pain at its worst? morning , daytime, evening and night Sleep (in general) Poor  Pain is worse with: walking, inactivity and standing Pain improves with: rest, heat/ice and laying flat on the floor Relief from Meds: 2  use a cane ability to climb steps?  yes do you drive?  yes  retired I need assistance with the following:  meal prep, household duties and shopping  numbness tingling trouble walking dizziness depression loss of taste or smell    Primary care Dr Erskine Emer[facility] as well   Family History  Problem Relation Age of Onset  . Healthy Son   . Healthy Son   . Healthy Daughter   . Colon cancer Neg Hx    Social History   Socioeconomic History  . Marital status: Married    Spouse name: Not on file  . Number of children: Not on file  . Years of education: Not on file  . Highest education level: Not on file  Occupational History  . Not on file  Tobacco Use  . Smoking status: Current Some Day Smoker    Packs/day: 0.25    Years: 50.00    Pack years: 12.50    Types: Cigarettes    Last attempt to quit: 11/10/2018    Years since quitting: 1.5  . Smokeless tobacco: Never Used  .  Tobacco comment: Hasn't smoked in 5-6 weeks  Vaping Use  . Vaping Use: Never used  Substance and Sexual Activity  . Alcohol use: Yes    Alcohol/week: 0.0 standard drinks    Comment: rare  . Drug use: No  . Sexual activity: Not Currently    Birth control/protection: Post-menopausal  Other Topics Concern  . Not on file  Social History Narrative  . Not on file   Social Determinants of Health   Financial Resource Strain: Not on file  Food Insecurity: Not on file  Transportation Needs: Not on file  Physical Activity: Not on file  Stress: Not on file  Social Connections: Not on file   Past Surgical History:  Procedure Laterality Date  . ANAL RECTAL MANOMETRY N/A 11/28/2013   Procedure: ANO RECTAL MANOMETRY;  Surgeon: Leighton Ruff, MD;   Location: [facility];  Service: Endoscopy;  Laterality: N/A;  . APPENDECTOMY    . BACK SURGERY     X2   . CHOLECYSTECTOMY    . COLONOSCOPY  03/29/03  . COLONOSCOPY N/A 12/09/2013   Procedure: COLONOSCOPY;  Surgeon: Rogene Houston, MD;  Location: [facility];  Service: Endoscopy;  Laterality: N/A;  730  . COLONOSCOPY N/A 02/04/2017   Procedure: COLONOSCOPY;  Surgeon: Rogene Houston, MD;  Location: [facility];  Service: Endoscopy;  Laterality: N/A;  930  . EYE SURGERY     bilateral cataract removal  . IR KYPHO LUMBAR INC FX REDUCE BONE BX UNI/BIL CANNULATION INC/IMAGING  11/24/2019  . IR KYPHO THORACIC WITH BONE BIOPSY  08/23/2019  . IR KYPHO THORACIC WITH BONE BIOPSY  11/24/2019  . IR RADIOLOGIST EVAL & MGMT  08/03/2019  . IR RADIOLOGIST EVAL & MGMT  09/29/2019  . IR RADIOLOGIST EVAL & MGMT  10/18/2019  . IR RADIOLOGIST EVAL & MGMT  04/11/2020  . SKIN CANCER EXCISION     RIGHT NECK    . TONSILLECTOMY     T+A  . TOTAL HIP ARTHROPLASTY Right 09/14/2012   Procedure: RIGHT TOTAL HIP ARTHROPLASTY ANTERIOR APPROACH and RIGHT KNEE STEROID INJECTION;  Surgeon: Mcarthur Rossetti, MD;  Location: [place];  Service: Orthopedics;  Laterality: Right;  . TOTAL HIP ARTHROPLASTY Left 11/05/2012   Procedure: LEFT TOTAL HIP ARTHROPLASTY ANTERIOR APPROACH;  Surgeon: Mcarthur Rossetti, MD;  Location: [facility];  Service: Orthopedics;  Laterality: Left;  . UMBILICAL HERNIA REPAIR  04/26/03   JENKINS  . UPPER GASTROINTESTINAL ENDOSCOPY  08/03/2009   NUR  . UPPER GASTROINTESTINAL ENDOSCOPY  03/29/2003   EGD ED TCS   Past Medical History:  Diagnosis Date  . Abdominal discomfort 11-02-12   suspected ?UTI- PCP MD placed on Cipro  . Anxiety   . Arthritis   . Asthma   . Cancer ([place])    skin CA  . Chronic constipation   . Depression   . Dysrhythmia    tachycardia  . Environmental allergies   . Fibromyalgia   . GERD (gastroesophageal reflux disease)   . Hemorrhoids   . Hypercholesteremia   .  Hypertension    BP 116/70   Pulse 70   Temp 97.8 F (36.6 C)   Ht 5' 7.5" (1.715 m)   Wt 169 lb 3.2 oz (76.7 kg)   SpO2 95%   BMI 26.11 kg/m   Opioid Risk Score:   Fall Risk Score:  `1  Depression screen PHQ 2/9  Depression screen [facility] 2/9 05/18/2020 04/03/2020 01/10/2020  Decreased Interest 1 1 0  Down,  Depressed, Hopeless 1 3 0  PHQ - 2 Score 2 4 0  Altered sleeping 3 1 -  Tired, decreased energy 1 1 -  Change in appetite 0 0 -  Feeling bad or failure about yourself  0 0 -  Trouble concentrating 1 1 -  Moving slowly or fidgety/restless 1 0 -  Suicidal thoughts 0 0 -  PHQ-9 Score 8 7 -  Difficult doing work/chores Somewhat difficult - -  Some recent data might be hidden   Review of Systems  Constitutional: Negative.   HENT: Negative.   Eyes: Negative.   Respiratory: Negative.   Cardiovascular: Negative.   Gastrointestinal: Positive for constipation.  Endocrine: Negative.   Genitourinary: Negative.   Musculoskeletal: Positive for back pain and gait problem.  Skin: Negative.   Allergic/Immunologic: Negative.   Neurological: Positive for dizziness and numbness.       Tingling  Hematological: Negative.   Psychiatric/Behavioral: Positive for dysphoric mood.  All other systems reviewed and are negative.      Objective:   Physical Exam Vitals and nursing note reviewed.  Constitutional:      General: She is not in acute distress.    Appearance: She is normal weight.  HENT:     Head: Normocephalic.     Mouth/Throat:     Mouth: Mucous membranes are moist.  Eyes:     Extraocular Movements: Extraocular movements intact.     Conjunctiva/sclera: Conjunctivae normal.     Pupils: Pupils are equal, round, and reactive to light.  Cardiovascular:     Rate and Rhythm: Normal rate and regular rhythm.     Pulses: Normal pulses.     Heart sounds: Normal heart sounds. No murmur heard.   Pulmonary:     Effort: Pulmonary effort is normal. No respiratory distress.      Breath sounds: Normal breath sounds.  Abdominal:     General: Abdomen is flat. Bowel sounds are normal. There is no distension.     Palpations: Abdomen is soft.  Musculoskeletal:     Right lower leg: No edema.     Left lower leg: No edema.     Comments: Tenderness to palpation bilateral lumbar paraspinals no tenderness over the PSIS Patient has limited shoulder abduction bilaterally as well as shoulder flexion bilaterally.  Hand wrist and elbow range of motion is full knee and ankle range of motion is full.  There is reduced hip internal and external rotation bilaterally  Skin:    General: Skin is warm and dry.  Neurological:     Mental Status: She is alert and oriented to person, place, and time.  Psychiatric:        Mood and Affect: Mood normal.        Behavior: Behavior normal.           Assessment & Plan:  #1.  Chronic low back pain she had some radicular type pain at one point but these resolved after epidural injections.  She primarily has lumbar axial pain at this time. She is already taking over-the-counter medications she states she has been doing some home exercises but I will print out some additional ones.  I do think she would benefit from physical therapy and will make referral. Would also benefit from lumbar medial branch blocks, diagnostic facet joints are the main pain generators.  If so she may benefit from lumbar radiofrequency neurotomy She will continue over-the-counter medications for now.  Given her constipation history would be judicious with any  type of opioid medications may be limited to occasional tramadol

## 2020-05-18 NOTE — Patient Instructions (Addendum)
Back Exercises These exercises help to make your trunk and back strong. They also help to keep the lower back flexible. Doing these exercises can help to prevent back pain or lessen existing pain.  If you have back pain, try to do these exercises 2-3 times each day or as told by your doctor.  As you get better, do the exercises once each day. Repeat the exercises more often as told by your doctor.  To stop back pain from coming back, do the exercises once each day, or as told by your doctor. Exercises Single knee to chest Do these steps 3-5 times in a row for each leg: 1. Lie on your back on a firm bed or the floor with your legs stretched out. 2. Bring one knee to your chest. 3. Grab your knee or thigh with both hands and hold them it in place. 4. Pull on your knee until you feel a gentle stretch in your lower back or buttocks. 5. Keep doing the stretch for 10-30 seconds. 6. Slowly let go of your leg and straighten it. Pelvic tilt Do these steps 5-10 times in a row: 1. Lie on your back on a firm bed or the floor with your legs stretched out. 2. Bend your knees so they point up to the ceiling. Your feet should be flat on the floor. 3. Tighten your lower belly (abdomen) muscles to press your lower back against the floor. This will make your tailbone point up to the ceiling instead of pointing down to your feet or the floor. 4. Stay in this position for 5-10 seconds while you gently tighten your muscles and breathe evenly. Cat-cow Do these steps until your lower back bends more easily: 1. Get on your hands and knees on a firm surface. Keep your hands under your shoulders, and keep your knees under your hips. You may put padding under your knees. 2. Let your head hang down toward your chest. Tighten (contract) the muscles in your belly. Point your tailbone toward the floor so your lower back becomes rounded like the back of a cat. 3. Stay in this position for 5 seconds. 4. Slowly lift your  head. Let the muscles of your belly relax. Point your tailbone up toward the ceiling so your back forms a sagging arch like the back of a cow. 5. Stay in this position for 5 seconds.   Press-ups Do these steps 5-10 times in a row: 1. Lie on your belly (face-down) on the floor. 2. Place your hands near your head, about shoulder-width apart. 3. While you keep your back relaxed and keep your hips on the floor, slowly straighten your arms to raise the top half of your body and lift your shoulders. Do not use your back muscles. You may change where you place your hands in order to make yourself more comfortable. 4. Stay in this position for 5 seconds. 5. Slowly return to lying flat on the floor.   Bridges Do these steps 10 times in a row: 1. Lie on your back on a firm surface. 2. Bend your knees so they point up to the ceiling. Your feet should be flat on the floor. Your arms should be flat at your sides, next to your body. 3. Tighten your butt muscles and lift your butt off the floor until your waist is almost as high as your knees. If you do not feel the muscles working in your butt and the back of your thighs, slide your feet   1-2 inches farther away from your butt. 4. Stay in this position for 3-5 seconds. 5. Slowly lower your butt to the floor, and let your butt muscles relax. If this exercise is too easy, try doing it with your arms crossed over your chest.   Belly crunches Do these steps 5-10 times in a row: 1. Lie on your back on a firm bed or the floor with your legs stretched out. 2. Bend your knees so they point up to the ceiling. Your feet should be flat on the floor. 3. Cross your arms over your chest. 4. Tip your chin a little bit toward your chest but do not bend your neck. 5. Tighten your belly muscles and slowly raise your chest just enough to lift your shoulder blades a tiny bit off of the floor. Avoid raising your body higher than that, because it can put too much stress on your  low back. 6. Slowly lower your chest and your head to the floor. Back lifts Do these steps 5-10 times in a row: 1. Lie on your belly (face-down) with your arms at your sides, and rest your forehead on the floor. 2. Tighten the muscles in your legs and your butt. 3. Slowly lift your chest off of the floor while you keep your hips on the floor. Keep the back of your head in line with the curve in your back. Look at the floor while you do this. 4. Stay in this position for 3-5 seconds. 5. Slowly lower your chest and your face to the floor. Contact a doctor if:  Your back pain gets a lot worse when you do an exercise.  Your back pain does not get better 2 hours after you exercise. If you have any of these problems, stop doing the exercises. Do not do them again unless your doctor says it is okay. Get help right away if:  You have sudden, very bad back pain. If this happens, stop doing the exercises. Do not do them again unless your doctor says it is okay. This information is not intended to replace advice given to you by your health care provider. Make sure you discuss any questions you have with your health care provider. Document Revised: 10/08/2017 Document Reviewed: 10/08/2017 Elsevier Patient Education  2021 [employer].   Back Exercises These exercises help to make your trunk and back strong. They also help to keep the lower back flexible. Doing these exercises can help to prevent back pain or lessen existing pain.  If you have back pain, try to do these exercises 2-3 times each day or as told by your doctor.  As you get better, do the exercises once each day. Repeat the exercises more often as told by your doctor.  To stop back pain from coming back, do the exercises once each day, or as told by your doctor. Exercises Single knee to chest Do these steps 3-5 times in a row for each leg: 7. Lie on your back on a firm bed or the floor with your legs stretched out. 8. Bring one  knee to your chest. 9. Grab your knee or thigh with both hands and hold them it in place. 10. Pull on your knee until you feel a gentle stretch in your lower back or buttocks. 11. Keep doing the stretch for 10-30 seconds. 12. Slowly let go of your leg and straighten it. Pelvic tilt Do these steps 5-10 times in a row: 5. Lie on your back on a  firm bed or the floor with your legs stretched out. 6. Bend your knees so they point up to the ceiling. Your feet should be flat on the floor. 7. Tighten your lower belly (abdomen) muscles to press your lower back against the floor. This will make your tailbone point up to the ceiling instead of pointing down to your feet or the floor. 8. Stay in this position for 5-10 seconds while you gently tighten your muscles and breathe evenly. Cat-cow Do these steps until your lower back bends more easily: 6. Get on your hands and knees on a firm surface. Keep your hands under your shoulders, and keep your knees under your hips. You may put padding under your knees. 7. Let your head hang down toward your chest. Tighten (contract) the muscles in your belly. Point your tailbone toward the floor so your lower back becomes rounded like the back of a cat. 8. Stay in this position for 5 seconds. 9. Slowly lift your head. Let the muscles of your belly relax. Point your tailbone up toward the ceiling so your back forms a sagging arch like the back of a cow. 10. Stay in this position for 5 seconds.   Press-ups Do these steps 5-10 times in a row: 6. Lie on your belly (face-down) on the floor. 7. Place your hands near your head, about shoulder-width apart. 8. While you keep your back relaxed and keep your hips on the floor, slowly straighten your arms to raise the top half of your body and lift your shoulders. Do not use your back muscles. You may change where you place your hands in order to make yourself more comfortable. 9. Stay in this position for 5 seconds. 10. Slowly  return to lying flat on the floor.   Bridges Do these steps 10 times in a row: 6. Lie on your back on a firm surface. 7. Bend your knees so they point up to the ceiling. Your feet should be flat on the floor. Your arms should be flat at your sides, next to your body. 8. Tighten your butt muscles and lift your butt off the floor until your waist is almost as high as your knees. If you do not feel the muscles working in your butt and the back of your thighs, slide your feet 1-2 inches farther away from your butt. 9. Stay in this position for 3-5 seconds. 10. Slowly lower your butt to the floor, and let your butt muscles relax. If this exercise is too easy, try doing it with your arms crossed over your chest.   Belly crunches Do these steps 5-10 times in a row: 7. Lie on your back on a firm bed or the floor with your legs stretched out. 8. Bend your knees so they point up to the ceiling. Your feet should be flat on the floor. 9. Cross your arms over your chest. 10. Tip your chin a little bit toward your chest but do not bend your neck. 62. Tighten your belly muscles and slowly raise your chest just enough to lift your shoulder blades a tiny bit off of the floor. Avoid raising your body higher than that, because it can put too much stress on your low back. 12. Slowly lower your chest and your head to the floor. Back lifts Do these steps 5-10 times in a row: 6. Lie on your belly (face-down) with your arms at your sides, and rest your forehead on the floor. 7. Tighten the muscles in  your legs and your butt. 8. Slowly lift your chest off of the floor while you keep your hips on the floor. Keep the back of your head in line with the curve in your back. Look at the floor while you do this. 9. Stay in this position for 3-5 seconds. 10. Slowly lower your chest and your face to the floor. Contact a doctor if:  Your back pain gets a lot worse when you do an exercise.  Your back pain does not get  better 2 hours after you exercise. If you have any of these problems, stop doing the exercises. Do not do them again unless your doctor says it is okay. Get help right away if:  You have sudden, very bad back pain. If this happens, stop doing the exercises. Do not do them again unless your doctor says it is okay. This information is not intended to replace advice given to you by your health care provider. Make sure you discuss any questions you have with your health care provider. Document Revised: 10/08/2017 Document Reviewed: 10/08/2017 Elsevier Patient Education  2021 [employer].

## 2020-05-21 ENCOUNTER — Telehealth: Payer: Self-pay | Admitting: Physical Medicine & Rehabilitation

## 2020-05-21 DIAGNOSIS — M25559 Pain in unspecified hip: Secondary | ICD-10-CM

## 2020-05-21 DIAGNOSIS — M545 Low back pain, unspecified: Secondary | ICD-10-CM

## 2020-05-21 NOTE — Telephone Encounter (Signed)
Patient had a fall last night called to see if she will need xray/MRI prior to having the procedure?  Please Advise. If needs imaging can please send to [facility]

## 2020-05-21 NOTE — Telephone Encounter (Signed)
Left message for her to call back with more detail.

## 2020-05-22 ENCOUNTER — Telehealth: Payer: Self-pay | Admitting: Physical Medicine & Rehabilitation

## 2020-05-22 NOTE — Telephone Encounter (Signed)
Mrs Zoe [name] called bac.  She is having so much pain.  She says it is at the base of her spine and into her hip.  She tripped over her dog and fell on this area.  Pleas advise. (she is wanting another mri)

## 2020-05-22 NOTE — Addendum Note (Signed)
Addended by: Caro Hight on: 05/22/2020 03:37 PM   Modules accepted: Orders

## 2020-05-22 NOTE — Telephone Encounter (Addendum)
Per Dr Letta Pate : Order x-ray of bilateral hips as well as thoracic and lumbar spine. Have patient follow-up with me or Zella Ball next week. Her other option would be to follow-up with Ortho "  Orders placed for bil hip and thoracic and lumbar xrays @ preferred location [facility].  Appt scheduled for her to follow up with [facility] next week.

## 2020-05-22 NOTE — Telephone Encounter (Signed)
Patient Returning call

## 2020-05-23 ENCOUNTER — Other Ambulatory Visit: Payer: Self-pay

## 2020-05-23 ENCOUNTER — Other Ambulatory Visit: Payer: Self-pay | Admitting: Physical Medicine & Rehabilitation

## 2020-05-23 ENCOUNTER — Ambulatory Visit (HOSPITAL_COMMUNITY)
Admission: RE | Admit: 2020-05-23 | Discharge: 2020-05-23 | Disposition: A | Discharge status: Home / Self Care | Payer: PPO | Source: Ambulatory Visit | Attending: Physical Medicine & Rehabilitation | Admitting: Physical Medicine & Rehabilitation

## 2020-05-23 DIAGNOSIS — M25552 Pain in left hip: Secondary | ICD-10-CM | POA: Diagnosis not present

## 2020-05-23 DIAGNOSIS — M545 Low back pain, unspecified: Secondary | ICD-10-CM | POA: Diagnosis not present

## 2020-05-23 DIAGNOSIS — S22050A Wedge compression fracture of T5-T6 vertebra, initial encounter for closed fracture: Secondary | ICD-10-CM | POA: Diagnosis not present

## 2020-05-23 DIAGNOSIS — S22080A Wedge compression fracture of T11-T12 vertebra, initial encounter for closed fracture: Secondary | ICD-10-CM | POA: Diagnosis not present

## 2020-05-23 DIAGNOSIS — M25551 Pain in right hip: Secondary | ICD-10-CM | POA: Diagnosis not present

## 2020-05-23 DIAGNOSIS — M47816 Spondylosis without myelopathy or radiculopathy, lumbar region: Secondary | ICD-10-CM | POA: Diagnosis not present

## 2020-05-23 DIAGNOSIS — M25559 Pain in unspecified hip: Secondary | ICD-10-CM

## 2020-05-23 DIAGNOSIS — I700 Atherosclerosis of aorta: Secondary | ICD-10-CM | POA: Diagnosis not present

## 2020-05-23 DIAGNOSIS — Z96642 Presence of left artificial hip joint: Secondary | ICD-10-CM | POA: Diagnosis not present

## 2020-06-01 ENCOUNTER — Encounter: Payer: Self-pay | Admitting: Physical Medicine & Rehabilitation

## 2020-06-01 ENCOUNTER — Other Ambulatory Visit: Payer: Self-pay

## 2020-06-01 ENCOUNTER — Encounter: Payer: PPO | Attending: Physical Medicine & Rehabilitation | Admitting: Physical Medicine & Rehabilitation

## 2020-06-01 VITALS — BP 141/86 | HR 64 | Temp 97.9°F | Ht 67.5 in | Wt 169.0 lb

## 2020-06-01 DIAGNOSIS — M25552 Pain in left hip: Secondary | ICD-10-CM | POA: Insufficient documentation

## 2020-06-01 DIAGNOSIS — M47817 Spondylosis without myelopathy or radiculopathy, lumbosacral region: Secondary | ICD-10-CM

## 2020-06-01 NOTE — Patient Instructions (Signed)
Has appt for injection this month

## 2020-06-01 NOTE — Progress Notes (Signed)
Subjective:    Patient ID: Zoe [name], female    DOB: [birth year], 85 y.o.   MRN: [id number]  HPI 85 year old female with primary complaint of left-sided buttock pain. She has a prior medical history significant for osteoporosis and has had kyphoplasty for T11-T12 and L1 compression fractures.  The patient had recent MRI of the lumbar spine as well as thoracic spine in March 2022 demonstrating no new compression fractures.  The patient had a fall 05/21/2020 and she started having left-sided buttock pain.  X-rays of the thoracic lumbar spines as well as pelvis and hips were obtained.  These showed no new compression fractures or worsening of other prior fractures.  There is no evidence of periprosthetic fracture.  There was no evidence of pubic ramus fracture or abnormalities around the ischium.  Reviewed x-rays with the patient including actual films. She has no radiating pain down the lower extremities.  She ambulates without an assistive device for short distances.   Pain Inventory Average Pain 7 Pain Right Now 7 My pain is sharp, burning and aching  In the last 24 hours, has pain interfered with the following? General activity 5 Relation with others 3 Enjoyment of life 7 What TIME of day is your pain at its worst? morning , daytime and evening Sleep (in general) Fair  Pain is worse with: walking, inactivity and standing Pain improves with: rest and heat/ice Relief from Meds: 0  Family History  Problem Relation Age of Onset  . Healthy Son   . Healthy Son   . Healthy Daughter   . Colon cancer Neg Hx    Social History   Socioeconomic History  . Marital status: Married    Spouse name: Not on file  . Number of children: Not on file  . Years of education: Not on file  . Highest education level: Not on file  Occupational History  . Not on file  Tobacco Use  . Smoking status: Current Some Day Smoker    Packs/day: 0.25    Years: 50.00    Pack years: 12.50    Types:  Cigarettes    Last attempt to quit: 11/10/2018    Years since quitting: 1.5  . Smokeless tobacco: Never Used  . Tobacco comment: Hasn't smoked in 5-6 weeks  Vaping Use  . Vaping Use: Never used  Substance and Sexual Activity  . Alcohol use: Yes    Alcohol/week: 0.0 standard drinks    Comment: rare  . Drug use: No  . Sexual activity: Not Currently    Birth control/protection: Post-menopausal  Other Topics Concern  . Not on file  Social History Narrative  . Not on file   Social Determinants of Health   Financial Resource Strain: Not on file  Food Insecurity: Not on file  Transportation Needs: Not on file  Physical Activity: Not on file  Stress: Not on file  Social Connections: Not on file   Past Surgical History:  Procedure Laterality Date  . ANAL RECTAL MANOMETRY N/A 11/28/2013   Procedure: ANO RECTAL MANOMETRY;  Surgeon: Leighton Ruff, MD;  Location: [facility];  Service: Endoscopy;  Laterality: N/A;  . APPENDECTOMY    . BACK SURGERY     X2   . CHOLECYSTECTOMY    . COLONOSCOPY  03/29/03  . COLONOSCOPY N/A 12/09/2013   Procedure: COLONOSCOPY;  Surgeon: Rogene Houston, MD;  [facility]: [facility];  Service: Endoscopy;  Laterality: N/A;  730  . COLONOSCOPY N/A 02/04/2017   Procedure:  COLONOSCOPY;  Surgeon: Rogene Houston, MD;  [facility]: [facility];  Service: Endoscopy;  Laterality: N/A;  930  . EYE SURGERY     bilateral cataract removal  . IR KYPHO LUMBAR INC FX REDUCE BONE BX UNI/BIL CANNULATION INC/IMAGING  11/24/2019  . IR KYPHO THORACIC WITH BONE BIOPSY  08/23/2019  . IR KYPHO THORACIC WITH BONE BIOPSY  11/24/2019  . IR RADIOLOGIST EVAL & MGMT  08/03/2019  . IR RADIOLOGIST EVAL & MGMT  09/29/2019  . IR RADIOLOGIST EVAL & MGMT  10/18/2019  . IR RADIOLOGIST EVAL & MGMT  04/11/2020  . SKIN CANCER EXCISION     RIGHT NECK    . TONSILLECTOMY     T+A  . TOTAL HIP ARTHROPLASTY Right 09/14/2012   Procedure: RIGHT TOTAL HIP ARTHROPLASTY ANTERIOR APPROACH and RIGHT KNEE  STEROID INJECTION;  Surgeon: Mcarthur Rossetti, MD;  Location: [facility];  Service: Orthopedics;  Laterality: Right;  . TOTAL HIP ARTHROPLASTY Left 11/05/2012   Procedure: LEFT TOTAL HIP ARTHROPLASTY ANTERIOR APPROACH;  Surgeon: Mcarthur Rossetti, MD;  Location: [facility];  Service: Orthopedics;  Laterality: Left;  . UMBILICAL HERNIA REPAIR  04/26/03   JENKINS  . UPPER GASTROINTESTINAL ENDOSCOPY  08/03/2009   NUR  . UPPER GASTROINTESTINAL ENDOSCOPY  03/29/2003   EGD ED TCS   Past Surgical History:  Procedure Laterality Date  . ANAL RECTAL MANOMETRY N/A 11/28/2013   Procedure: ANO RECTAL MANOMETRY;  Surgeon: Leighton Ruff, MD;  Location: [facility];  Service: Endoscopy;  Laterality: N/A;  . APPENDECTOMY    . BACK SURGERY     X2   . CHOLECYSTECTOMY    . COLONOSCOPY  03/29/03  . COLONOSCOPY N/A 12/09/2013   Procedure: COLONOSCOPY;  Surgeon: Rogene Houston, MD;  [facility];  Service: Endoscopy;  Laterality: N/A;  730  . COLONOSCOPY N/A 02/04/2017   Procedure: COLONOSCOPY;  Surgeon: Rogene Houston, MD;  [facility];  Service: Endoscopy;  Laterality: N/A;  930  . EYE SURGERY     bilateral cataract removal  . IR KYPHO LUMBAR INC FX REDUCE BONE BX UNI/BIL CANNULATION INC/IMAGING  11/24/2019  . IR KYPHO THORACIC WITH BONE BIOPSY  08/23/2019  . IR KYPHO THORACIC WITH BONE BIOPSY  11/24/2019  . IR RADIOLOGIST EVAL & MGMT  08/03/2019  . IR RADIOLOGIST EVAL & MGMT  09/29/2019  . IR RADIOLOGIST EVAL & MGMT  10/18/2019  . IR RADIOLOGIST EVAL & MGMT  04/11/2020  . SKIN CANCER EXCISION     RIGHT NECK    . TONSILLECTOMY     T+A  . TOTAL HIP ARTHROPLASTY Right 09/14/2012   Procedure: RIGHT TOTAL HIP ARTHROPLASTY ANTERIOR APPROACH and RIGHT KNEE STEROID INJECTION;  Surgeon: Mcarthur Rossetti, MD;  Location: [facility];  Service: Orthopedics;  Laterality: Right;  . TOTAL HIP ARTHROPLASTY Left 11/05/2012   Procedure: LEFT TOTAL HIP ARTHROPLASTY ANTERIOR APPROACH;  Surgeon:  Mcarthur Rossetti, MD;  Location: [facility];  Service: Orthopedics;  Laterality: Left;  . UMBILICAL HERNIA REPAIR  04/26/03   JENKINS  . UPPER GASTROINTESTINAL ENDOSCOPY  08/03/2009   NUR  . UPPER GASTROINTESTINAL ENDOSCOPY  03/29/2003   EGD ED TCS   Past Medical History:  Diagnosis Date  . Abdominal discomfort 11-02-12   suspected ?UTI- PCP MD placed on Cipro  . Anxiety   . Arthritis   . Asthma   . Cancer ([place])    skin CA  . Chronic constipation   . Depression   .  Dysrhythmia    tachycardia  . Environmental allergies   . Fibromyalgia   . GERD (gastroesophageal reflux disease)   . Hemorrhoids   . Hypercholesteremia   . Hypertension    BP (!) 141/86   Pulse 64   Temp 97.9 F (36.6 C)   Ht 5' 7.5" (1.715 m)   Wt 169 lb (76.7 kg)   SpO2 94%   BMI 26.08 kg/m   Opioid Risk Score:   Fall Risk Score:  `1  Depression screen PHQ 2/9  Depression screen [facility] 2/9 05/18/2020 04/03/2020 01/10/2020  Decreased Interest 1 1 0  Down, Depressed, Hopeless 1 3 0  PHQ - 2 Score 2 4 0  Altered sleeping 3 1 -  Tired, decreased energy 1 1 -  Change in appetite 0 0 -  Feeling bad or failure about yourself  0 0 -  Trouble concentrating 1 1 -  Moving slowly or fidgety/restless 1 0 -  Suicidal thoughts 0 0 -  PHQ-9 Score 8 7 -  Difficult doing work/chores Somewhat difficult - -  Some recent data might be hidden    Review of Systems  Constitutional: Negative.   HENT: Negative.   Eyes: Negative.   Respiratory: Negative.   Cardiovascular: Negative.   Gastrointestinal: Negative.   Endocrine: Negative.   Genitourinary: Negative.   Musculoskeletal: Positive for arthralgias, back pain and gait problem.  Allergic/Immunologic: Negative.   Psychiatric/Behavioral: Negative.   All other systems reviewed and are negative.      Objective:   Physical Exam Vitals and nursing note reviewed.  Constitutional:      Appearance: She is obese.  HENT:     Head: Normocephalic and atraumatic.   Eyes:     Conjunctiva/sclera: Conjunctivae normal.     Pupils: Pupils are equal, round, and reactive to light.  Musculoskeletal:     Comments: Patient with reduced lumbar spine range of motion she has approximately 25% flexion extension.  She has no tenderness along the thoracic or lumbar area to palpation.  She has tenderness over the left ischial tuberosity.   Skin:    General: Skin is warm and dry.  Neurological:     Mental Status: She is alert and oriented to person, place, and time.     Comments: Negative straight leg raising bilaterally motor strength is 5/5 bilateral hip flexor knee extensor ankle dorsiflexor. Gait is without evidence of toe drag or knee instability  Psychiatric:        Mood and Affect: Mood normal.        Behavior: Behavior normal.    Lumbar extension is more painful than lumbar flexion there is some tenderness around the lumbosacral junction bilaterally       Assessment & Plan:  1.  Left ischial pain over the tuberosity.  No evidence of fracture.  Likely has contusion.  This should improve with time.  2.  Lumbar spondylosis without myelopathy we will schedule for lumbar medial branch blocks.  We will follow this up with physical therapy.  #3.  Recurrent falls with osteoporosis referral to physical therapy she wishes to wait till after the medial branch blocks to proceed with this.

## 2020-06-19 ENCOUNTER — Encounter (HOSPITAL_BASED_OUTPATIENT_CLINIC_OR_DEPARTMENT_OTHER): Payer: PPO | Admitting: Physical Medicine & Rehabilitation

## 2020-06-19 ENCOUNTER — Other Ambulatory Visit: Payer: Self-pay

## 2020-06-19 ENCOUNTER — Encounter: Payer: Self-pay | Admitting: Physical Medicine & Rehabilitation

## 2020-06-19 VITALS — BP 95/64 | HR 78 | Temp 98.5°F | Ht 67.5 in | Wt 162.2 lb

## 2020-06-19 DIAGNOSIS — M47817 Spondylosis without myelopathy or radiculopathy, lumbosacral region: Secondary | ICD-10-CM

## 2020-06-19 NOTE — Patient Instructions (Signed)

## 2020-06-19 NOTE — Progress Notes (Signed)
  [facility]   Name: Zoe [name] DOB:01-07-1936 MRN: [id number]  Date:06/19/2020  Physician: Alysia Penna, MD    Nurse/CMA: Geryl Rankins CMA  Allergies:  Allergies  Allergen Reactions  . Codeine Other (See Comments)    Pains in abdominal area  . Escitalopram Itching  . Other     NOVACAINE     ITCHING Anti-depressants  . Oxycodone     Abdominal pain  . Paxil [Paroxetine Hcl] Itching  . Procaine Itching    Consent Signed: Yes.    Is patient diabetic? No.  CBG today? N/A  Pregnant: No. LMP: No LMP recorded. Patient is postmenopausal. (age 65-55)  Anticoagulants: no Anti-inflammatory: yes (Naproxen & Baby ASA) Antibiotics: no  Procedure: Bilateral Medials Branch Block L3-4-5 Position: Prone Start Time: 1:55pm End Time: 2:07pm Fluoro Time: 51s  RN/CMA [employer], CMA    Time 1:34 pm 2:11pm    BP 95/64 106/70    Pulse 78 77    Respirations 12 12    O2 Sat 95 96    S/S 6 6    Pain Level 6/10 2/10     D/C home with Husband, patient A & O X 3, D/C instructions reviewed, and sits independently.

## 2020-06-19 NOTE — Progress Notes (Signed)
Bilateral Lumbar L3, L4  medial branch blocks and L 5 dorsal ramus injection under fluoroscopic guidance  Indication: Lumbar pain which is not relieved by medication management or other conservative care and interfering with self-care and mobility.  Informed consent was obtained after describing risks and benefits of the procedure with the patient, this includes bleeding, infection, paralysis and medication side effects.  The patient wishes to proceed and has given written consent.  The patient was placed in prone position. Right site and RIght side confirmed after time out. The lumbar area was marked and prepped with Betadine.  One mL of 1% lidocaine was injected into each of 6 areas into the skin and subcutaneous tissue.  Then a 22-gauge 3.5 in spinal needle was inserted targeting the junction of the left S1 superior articular process and sacral ala junction. Needle was advanced under fluoroscopic guidance.  Bone contact was made.  Isovue 200 was injected x 0.5 mL demonstrating no intravascular uptake.  Then a solution  of 2% MPF lidocaine was injected x 0.5 mL.  Then the left L5 superior articular process in transverse process junction was targeted.  Bone contact was made.  Isovue 200 was injected x 0.5 mL demonstrating no intravascular uptake. Then a solution containing  2% MPF lidocaine was injected x 0.5 mL.  Then the left L4 superior articular process in transverse process junction was targeted.  Bone contact was made.  Isovue 200 was injected x 0.5 mL demonstrating no intravascular uptake.  Then a solution containing2% MPF lidocaine was injected x 0.5 mL.  This same procedure was performed on the right side using the same needle, technique and injectate.  Patient tolerated procedure well.  Post procedure instructions were given.  Pre injection back pain 6/10 Post injection back pain 2/10

## 2020-06-26 ENCOUNTER — Ambulatory Visit: Payer: PPO | Admitting: Physical Medicine & Rehabilitation

## 2020-07-03 ENCOUNTER — Ambulatory Visit (INDEPENDENT_AMBULATORY_CARE_PROVIDER_SITE_OTHER): Payer: PPO | Admitting: Internal Medicine

## 2020-07-04 ENCOUNTER — Ambulatory Visit (INDEPENDENT_AMBULATORY_CARE_PROVIDER_SITE_OTHER): Payer: PPO | Admitting: Nurse Practitioner

## 2020-07-04 ENCOUNTER — Other Ambulatory Visit: Payer: Self-pay

## 2020-07-04 ENCOUNTER — Encounter: Payer: Self-pay | Admitting: Nurse Practitioner

## 2020-07-04 VITALS — BP 105/69 | HR 81 | Temp 98.2°F | Resp 20 | Ht 67.5 in | Wt 170.0 lb

## 2020-07-04 DIAGNOSIS — E785 Hyperlipidemia, unspecified: Secondary | ICD-10-CM

## 2020-07-04 DIAGNOSIS — J302 Other seasonal allergic rhinitis: Secondary | ICD-10-CM

## 2020-07-04 DIAGNOSIS — G4700 Insomnia, unspecified: Secondary | ICD-10-CM | POA: Insufficient documentation

## 2020-07-04 DIAGNOSIS — R7301 Impaired fasting glucose: Secondary | ICD-10-CM

## 2020-07-04 DIAGNOSIS — M47816 Spondylosis without myelopathy or radiculopathy, lumbar region: Secondary | ICD-10-CM

## 2020-07-04 DIAGNOSIS — J4520 Mild intermittent asthma, uncomplicated: Secondary | ICD-10-CM | POA: Diagnosis not present

## 2020-07-04 DIAGNOSIS — M810 Age-related osteoporosis without current pathological fracture: Secondary | ICD-10-CM | POA: Insufficient documentation

## 2020-07-04 MED ORDER — TRAZODONE HCL 50 MG PO TABS: 25.0000 mg | ORAL_TABLET | Freq: Every evening | ORAL | 3 refills | Status: DC | PRN

## 2020-07-04 MED ORDER — METHYLPREDNISOLONE ACETATE 80 MG/ML IJ SUSP
40.0000 mg | Freq: Once | INTRAMUSCULAR | Status: AC
  Administered 2020-07-04: 40 mg via INTRAMUSCULAR

## 2020-07-04 MED ORDER — GABAPENTIN 300 MG PO CAPS: 300.0000 mg | ORAL_CAPSULE | Freq: Four times a day (QID) | ORAL | 1 refills | Status: DC

## 2020-07-04 NOTE — Assessment & Plan Note (Signed)
-  IM depomedrol today for her allergies

## 2020-07-04 NOTE — Addendum Note (Signed)
Addended by: Lonn Georgia on: 07/04/2020 02:39 PM   Modules accepted: Orders

## 2020-07-04 NOTE — Assessment & Plan Note (Signed)
-  she states that she had an episode where her airway close doff and she was unable to speak for a few minutes -asthma vs esophageal dysfunction (sees Rehman at end of month) -discussed using albuterol when she feels like this to treat asthma attack/opoen her airways

## 2020-07-04 NOTE — Assessment & Plan Note (Signed)
-  Rx. Trazodone -she has been taking xanax, but that hasn't been helping her sleep recently -decreased sleep d/t pain, and she is seeing pain mgmt

## 2020-07-04 NOTE — Assessment & Plan Note (Signed)
-  was started on alendronate after DEXA scan -she didn't take alendronate d/t concerns with her esophagus -she would like to wait for Dr. Laural Golden to say her esophagus is working well before she risks esophageal ulcers form alendronate

## 2020-07-04 NOTE — Patient Instructions (Signed)
Please have fasting labs drawn this week. You can have them drawn today if you are fasting.

## 2020-07-04 NOTE — Progress Notes (Signed)
Established Patient Office Visit  Subjective:  Patient ID: Zoe [name], female    DOB: [birth year]  Age: 85 y.o. MRN: [id number]  CC:  Chief Complaint  Patient presents with  . Back Pain    HPI Zoe [name] presents for lab follow-up, but Zoe [name] has not had fasting labs drawn prior to this visit.  At Zoe [name] last visit, Zoe [name] was having nausea. Zoe [name] has significant GI history (GERD, constipation, esophageal dysphagia) and has been followed by Dr. Laural Golden, and Zoe [name] has a follow-up appointment later this month. We checked Zoe [name] digoxin at that time, and it was subtherapeutic, so not likely to be the cuase of Zoe [name] nausea.  At Zoe [name] last appointment, Zoe [name] fasting glucose was elevated, and Zoe [name] A1c was 5.0.  Zoe [name] has left shoulder pain, and that keeps Zoe [name] awake at night. Zoe [name] has back pain, and Zoe [name] feels better with gabapentin QID instead of TID.    Past Medical History:  Diagnosis Date  . Abdominal discomfort 11-02-12   suspected ?UTI- PCP MD placed on Cipro  . Anxiety   . Arthritis   . Asthma   . Cancer ([place])    skin CA  . Chronic constipation   . Depression   . Dysrhythmia    tachycardia  . Environmental allergies   . Fibromyalgia   . GERD (gastroesophageal reflux disease)   . Hemorrhoids   . Hypercholesteremia   . Hypertension     Past Surgical History:  Procedure Laterality Date  . ANAL RECTAL MANOMETRY N/A 11/28/2013   Procedure: ANO RECTAL MANOMETRY;  Surgeon: Leighton Ruff, MD;  Location: [facility];  Service: Endoscopy;  Laterality: N/A;  . APPENDECTOMY    . BACK SURGERY     X2   . CHOLECYSTECTOMY    . COLONOSCOPY  03/29/03  . COLONOSCOPY N/A 12/09/2013   Procedure: COLONOSCOPY;  Surgeon: Rogene Houston, MD;  Location: [facility];  Service: Endoscopy;  Laterality: N/A;  730  . COLONOSCOPY N/A 02/04/2017   Procedure: COLONOSCOPY;  Surgeon: Rogene Houston, MD;  Location: [facility];  Service: Endoscopy;  Laterality: N/A;  930  . EYE SURGERY     bilateral  cataract removal  . IR KYPHO LUMBAR INC FX REDUCE BONE BX UNI/BIL CANNULATION INC/IMAGING  11/24/2019  . IR KYPHO THORACIC WITH BONE BIOPSY  08/23/2019  . IR KYPHO THORACIC WITH BONE BIOPSY  11/24/2019  . IR RADIOLOGIST EVAL & MGMT  08/03/2019  . IR RADIOLOGIST EVAL & MGMT  09/29/2019  . IR RADIOLOGIST EVAL & MGMT  10/18/2019  . IR RADIOLOGIST EVAL & MGMT  04/11/2020  . SKIN CANCER EXCISION     RIGHT NECK    . TONSILLECTOMY     T+A  . TOTAL HIP ARTHROPLASTY Right 09/14/2012   Procedure: RIGHT TOTAL HIP ARTHROPLASTY ANTERIOR APPROACH and RIGHT KNEE STEROID INJECTION;  Surgeon: Mcarthur Rossetti, MD;  Location: [facility];  Service: Orthopedics;  Laterality: Right;  . TOTAL HIP ARTHROPLASTY Left 11/05/2012   Procedure: LEFT TOTAL HIP ARTHROPLASTY ANTERIOR APPROACH;  Surgeon: Mcarthur Rossetti, MD;  Location: [facility];  Service: Orthopedics;  Laterality: Left;  . UMBILICAL HERNIA REPAIR  04/26/03   JENKINS  . UPPER GASTROINTESTINAL ENDOSCOPY  08/03/2009   NUR  . UPPER GASTROINTESTINAL ENDOSCOPY  03/29/2003   EGD ED TCS    Family History  Problem Relation Age of Onset  . Healthy Son   . Healthy Son   . Healthy Daughter   . Colon cancer  Neg Hx     Social History   Socioeconomic History  . Marital status: Married    Spouse name: Not on file  . Number of children: Not on file  . Years of education: Not on file  . Highest education level: Not on file  Occupational History  . Not on file  Tobacco Use  . Smoking status: Current Some Day Smoker    Packs/day: 0.25    Years: 50.00    Pack years: 12.50    Types: Cigarettes    Last attempt to quit: 11/10/2018    Years since quitting: 1.6  . Smokeless tobacco: Never Used  . Tobacco comment: Hasn't smoked in 5-6 weeks  Vaping Use  . Vaping Use: Never used  Substance and Sexual Activity  . Alcohol use: Yes    Alcohol/week: 0.0 standard drinks    Comment: rare  . Drug use: No  . Sexual activity: Not Currently    Birth  control/protection: Post-menopausal  Other Topics Concern  . Not on file  Social History Narrative  . Not on file   Social Determinants of Health   Financial Resource Strain: Not on file  Food Insecurity: Not on file  Transportation Needs: Not on file  Physical Activity: Not on file  Stress: Not on file  Social Connections: Not on file  Intimate Partner Violence: Not on file    Outpatient Medications Prior to Visit  Medication Sig Dispense Refill  . ADVAIR DISKUS 250-50 MCG/DOSE AEPB Inhale 1 puff into the lungs 2 (two) times daily.    Marland Kitchen albuterol (PROVENTIL HFA;VENTOLIN HFA) 108 (90 BASE) MCG/ACT inhaler Inhale 2 puffs into the lungs every 6 (six) hours as needed for wheezing or shortness of breath.    Marland Kitchen alendronate (FOSAMAX) 70 MG tablet Take 1 tablet (70 mg total) by mouth every 7 (seven) days. Take with a full glass of water on an empty stomach. 4 tablet 11  . allopurinol (ZYLOPRIM) 300 MG tablet Take 1 tablet (300 mg total) by mouth daily. 90 tablet 1  . ALPRAZolam (XANAX) 0.5 MG tablet Take 1 tablet (0.5 mg total) by mouth 2 (two) times daily as needed for anxiety. 60 tablet 2  . aspirin 81 MG tablet Take 81 mg by mouth daily.    . Biotin 5000 MCG CAPS Take 5,000 mcg by mouth daily.    . calcium-vitamin D 250-100 MG-UNIT tablet Take 1 tablet by mouth 2 (two) times daily. Started 2 days ago    . diclofenac Sodium (VOLTAREN) 1 % GEL APPLY 2-4 GRAMS TOPICALLY FOUR TIMES DAILY 200 g 3  . digoxin (LANOXIN) 0.125 MG tablet Take 125 mcg by mouth daily.    Marland Kitchen doxepin (SINEQUAN) 100 MG capsule Take 100 mg by mouth at bedtime.    . fexofenadine (ALLEGRA) 60 MG tablet Take 60 mg by mouth daily.     . IBU 800 MG tablet Take 800 mg by mouth 3 (three) times daily.    Marland Kitchen ipratropium-albuterol (DUONEB) 0.5-2.5 (3) MG/3ML SOLN Inhale 3 mLs into the lungs every 4 (four) hours as needed (SOB).     Marland Kitchen lubiprostone (AMITIZA) 24 MCG capsule Take 1 capsule (24 mcg total) by mouth 2 (two) times daily  with a meal. 60 capsule 1  . Multiple Vitamin (MULTIVITAMIN) tablet Take 1 tablet by mouth daily.    . naproxen (NAPROSYN) 500 MG tablet Take 500 mg by mouth 2 (two) times daily with a meal.     . omeprazole (PRILOSEC) 20  MG capsule Take 20 mg by mouth daily.    . pravastatin (PRAVACHOL) 20 MG tablet Take 20 mg by mouth every other day.    . prednisoLONE acetate (PRED FORTE) 1 % ophthalmic suspension SMARTSIG:4 Drop(s) Right Ear Twice Daily PRN    . senna (SENOKOT) 8.6 MG tablet Take 5 tablets (43 mg total) by mouth daily. (Patient taking differently: Take 5 tablets by mouth 2 (two) times daily.)    . gabapentin (NEURONTIN) 300 MG capsule Take 1 capsule (300 mg total) by mouth 3 (three) times daily. 270 capsule 2   No facility-administered medications prior to visit.    Allergies  Allergen Reactions  . Codeine Other (See Comments)    Pains in abdominal area  . Escitalopram Itching  . Other     NOVACAINE     ITCHING Anti-depressants  . Oxycodone     Abdominal pain  . Paxil [Paroxetine Hcl] Itching  . Procaine Itching    ROS Review of Systems  Constitutional: Negative.   HENT: Positive for rhinorrhea and sneezing.   Eyes: Positive for itching.  Respiratory: Negative.   Cardiovascular: Negative.   Musculoskeletal: Positive for arthralgias and back pain.       Chronic pain  Psychiatric/Behavioral: Positive for sleep disturbance. Negative for self-injury and suicidal ideas.      Objective:    Physical Exam Constitutional:      Appearance: Normal appearance.  HENT:     Head: Normocephalic and atraumatic.     Nose: Rhinorrhea present.  Eyes:     Extraocular Movements: Extraocular movements intact.     Conjunctiva/sclera: Conjunctivae normal.     Pupils: Pupils are equal, round, and reactive to light.  Cardiovascular:     Rate and Rhythm: Normal rate and regular rhythm.     Pulses: Normal pulses.     Heart sounds: Normal heart sounds.  Pulmonary:     Effort: Pulmonary  effort is normal.     Breath sounds: Normal breath sounds.  Neurological:     Mental Status: Zoe [name] is alert.  Psychiatric:        Mood and Affect: Mood normal.        Behavior: Behavior normal.        Thought Content: Thought content normal.        Judgment: Judgment normal.     BP 105/69   Pulse 81   Temp 98.2 F (36.8 C)   Resp 20   Ht 5' 7.5" (1.715 m)   Wt 170 lb (77.1 kg)   SpO2 93%   BMI 26.23 kg/m  Wt Readings from Last 3 Encounters:  07/04/20 170 lb (77.1 kg)  06/19/20 162 lb 3.2 oz (73.6 kg)  06/01/20 169 lb (76.7 kg)     Health Maintenance Due  Topic Date Due  . COVID-19 Vaccine (1) Never done  . Pneumococcal Vaccine 42-28 Years old (1 of 2 - PPSV23) Never done  . TETANUS/TDAP  Never done  . PNA vac Low Risk Adult (1 of 2 - PCV13) Never done    There are no preventive care reminders to display for this patient.  No results found for: TSH Lab Results  Component Value Date   WBC 7.5 03/29/2020   HGB 12.6 03/29/2020   HCT 37.8 03/29/2020   MCV 96 03/29/2020   PLT 200 03/29/2020   Lab Results  Component Value Date   NA 141 03/29/2020   K 4.5 03/29/2020   CO2 20 03/29/2020   GLUCOSE 112 (  H) 03/29/2020   BUN 13 03/29/2020   CREATININE 0.65 03/29/2020   BILITOT 0.3 03/29/2020   ALKPHOS 74 03/29/2020   AST 19 03/29/2020   ALT 16 03/29/2020   PROT 6.6 03/29/2020   ALBUMIN 4.1 03/29/2020   CALCIUM 9.3 03/29/2020   ANIONGAP 12 08/23/2019   EGFR 88 03/29/2020   Lab Results  Component Value Date   CHOL 155 03/29/2020   Lab Results  Component Value Date   HDL 49 03/29/2020   Lab Results  Component Value Date   LDLCALC 80 03/29/2020   Lab Results  Component Value Date   TRIG 150 (H) 03/29/2020   Lab Results  Component Value Date   CHOLHDL 3.2 03/29/2020   Lab Results  Component Value Date   HGBA1C 5.0 03/29/2020      Assessment & Plan:   Problem List Items Addressed This Visit      Respiratory   Asthma    -Zoe [name] states that Zoe [name]  had an episode where Zoe [name] airway close doff and Zoe [name] was unable to speak for a few minutes -asthma vs esophageal dysfunction (sees Rehman at end of month) -discussed using albuterol when Zoe [name] feels like this to treat asthma attack/opoen Zoe [name] airways         Musculoskeletal and Integument   Spondylosis without myelopathy or radiculopathy, lumbar region   Relevant Medications   gabapentin (NEURONTIN) 300 MG capsule   Osteoporosis    -was started on alendronate after DEXA scan -Zoe [name] didn't take alendronate d/t concerns with Zoe [name] esophagus -Zoe [name] would like to wait for Dr. Laural Golden to say Zoe [name] esophagus is working well before Zoe [name] risks esophageal ulcers form alendronate      Relevant Orders   VITAMIN D 25 Hydroxy (Vit-D Deficiency, Fractures)     Other   Hyperlipidemia - Primary   Relevant Orders   CBC with Differential/Platelet   CMP14+EGFR   Lipid Panel With LDL/HDL Ratio   Seasonal allergies    -IM depomedrol today for Zoe [name] allergies      Insomnia    -Rx. Trazodone -Zoe [name] has been taking xanax, but that hasn't been helping Zoe [name] sleep recently -decreased sleep d/t pain, and Zoe [name] is seeing pain mgmt       Relevant Medications   traZODone (DESYREL) 50 MG tablet    Other Visit Diagnoses    IFG (impaired fasting glucose)       Relevant Orders   Hemoglobin A1c      Meds ordered this encounter  Medications  . gabapentin (NEURONTIN) 300 MG capsule    Sig: Take 1 capsule (300 mg total) by mouth 4 (four) times daily.    Dispense:  360 capsule    Refill:  1  . traZODone (DESYREL) 50 MG tablet    Sig: Take 0.5-1 tablets (25-50 mg total) by mouth at bedtime as needed for sleep.    Dispense:  30 tablet    Refill:  3    Follow-up: Return in about 6 months (around 01/03/2021) for Lab follow-up.    Noreene Larsson, NP

## 2020-07-10 ENCOUNTER — Other Ambulatory Visit: Payer: Self-pay

## 2020-07-10 ENCOUNTER — Ambulatory Visit (INDEPENDENT_AMBULATORY_CARE_PROVIDER_SITE_OTHER): Payer: PPO

## 2020-07-10 ENCOUNTER — Ambulatory Visit (INDEPENDENT_AMBULATORY_CARE_PROVIDER_SITE_OTHER): Payer: PPO | Admitting: Internal Medicine

## 2020-07-10 ENCOUNTER — Encounter (INDEPENDENT_AMBULATORY_CARE_PROVIDER_SITE_OTHER): Payer: Self-pay | Admitting: Internal Medicine

## 2020-07-10 VITALS — BP 130/82 | HR 80 | Temp 97.6°F | Ht 67.5 in | Wt 167.0 lb

## 2020-07-10 VITALS — BP 138/82 | Ht 67.5 in | Wt 167.0 lb

## 2020-07-10 DIAGNOSIS — Z0000 Encounter for general adult medical examination without abnormal findings: Secondary | ICD-10-CM | POA: Diagnosis not present

## 2020-07-10 DIAGNOSIS — K5900 Constipation, unspecified: Secondary | ICD-10-CM

## 2020-07-10 DIAGNOSIS — R1319 Other dysphagia: Secondary | ICD-10-CM

## 2020-07-10 NOTE — Progress Notes (Signed)
Presenting complaint;  Follow for chronic constipation. Patient complains of swallowing difficulty.  Database and subjective:  Patient is a 85-year-old Caucasian female with lifelong constipation dependent on high-dose OTC laxatives, chronic GERD history of colonic adenomas who is here for scheduled visit.  She was last seen in November 2021. She says her constipation is about the same.  She generally has 2 bowel movements per week.  She is taking 10 tablets of senna every day along with Amitiza.  She has not been able to wean herself off senna.  She does not experience abdominal pain or bloating.  She feels she is doing well if she has 2 bowel movements per week.  Once in a while she may have as many as 4 stools per week.  Her appetite is good.  She is trying to eat fruits vegetables raisins and walnuts. Her new complaint today is 1 of swallowing difficulty which started 4 to 6 weeks ago.  She feels heartburn is well controlled with omeprazole.  She says she had 2 episodes where she felt she could not breathe.  She recalls 1 episode occurred 8 years ago when she had 1 episode few weeks ago.  She says the last episode occurred while she was sitting and was not even eating or drinking.  Dysphagia spells occur every 3 to 4 weeks primarily with solids particularly meat.  She says when she has a spell she drinks few sips of warm water which results in burping and food regurgitation. She is on alendronate but has not had any difficulty swallowing this pill. Patient believes she had her esophagus dilated several years ago but there is no record about this.  She did have EGD in March 2005 which revealed small sliding hiatal hernia. Her last colonoscopy was in January 2019.  She had melanosis coli.  Prep was fair and no polyps were found. She says she has osteoporosis and pain in multiple joints.  She is worried that she could get compression fracture.   Current Medications: Outpatient Encounter Medications  as of 07/10/2020  Medication Sig   ADVAIR DISKUS 250-50 MCG/DOSE AEPB Inhale 1 puff into the lungs 2 (two) times daily.   albuterol (PROVENTIL HFA;VENTOLIN HFA) 108 (90 BASE) MCG/ACT inhaler Inhale 2 puffs into the lungs every 6 (six) hours as needed for wheezing or shortness of breath.   allopurinol (ZYLOPRIM) 300 MG tablet Take 1 tablet (300 mg total) by mouth daily.   ALPRAZolam (XANAX) 0.5 MG tablet Take 1 tablet (0.5 mg total) by mouth 2 (two) times daily as needed for anxiety.   aspirin 81 MG tablet Take 81 mg by mouth daily.   Biotin 5000 MCG CAPS Take 5,000 mcg by mouth daily.   calcium-vitamin D 250-100 MG-UNIT tablet Take 1 tablet by mouth 2 (two) times daily. Started 2 days ago   diclofenac Sodium (VOLTAREN) 1 % GEL APPLY 2-4 GRAMS TOPICALLY FOUR TIMES DAILY   digoxin (LANOXIN) 0.125 MG tablet Take 125 mcg by mouth daily.   doxepin (SINEQUAN) 100 MG capsule Take 100 mg by mouth at bedtime.   fexofenadine (ALLEGRA) 60 MG tablet Take 60 mg by mouth daily.    gabapentin (NEURONTIN) 300 MG capsule Take 1 capsule (300 mg total) by mouth 4 (four) times daily.   ipratropium-albuterol (DUONEB) 0.5-2.5 (3) MG/3ML SOLN Inhale 3 mLs into the lungs every 4 (four) hours as needed (SOB).    lubiprostone (AMITIZA) 24 MCG capsule Take 1 capsule (24 mcg total) by mouth 2 (two) times  daily with a meal.   Multiple Vitamin (MULTIVITAMIN) tablet Take 1 tablet by mouth daily.   naproxen (NAPROSYN) 500 MG tablet Take 500 mg by mouth 2 (two) times daily with a meal.    omeprazole (PRILOSEC) 20 MG capsule Take 20 mg by mouth daily.   pravastatin (PRAVACHOL) 20 MG tablet Take 20 mg by mouth every other day.   prednisoLONE acetate (PRED FORTE) 1 % ophthalmic suspension SMARTSIG:4 Drop(s) Right Ear Twice Daily PRN   senna (SENOKOT) 8.6 MG tablet Take 5 tablets (43 mg total) by mouth daily. (Patient taking differently: Take 5 tablets by mouth 2 (two) times daily.)   alendronate (FOSAMAX) 70 MG tablet Take 1  tablet (70 mg total) by mouth every 7 (seven) days. Take with a full glass of water on an empty stomach. (Patient not taking: Reported on 07/10/2020)   IBU 800 MG tablet Take 800 mg by mouth 3 (three) times daily. (Patient not taking: Reported on 07/10/2020)   traZODone (DESYREL) 50 MG tablet Take 0.5-1 tablets (25-50 mg total) by mouth at bedtime as needed for sleep. (Patient not taking: Reported on 07/10/2020)   No facility-administered encounter medications on file as of 07/10/2020.     Objective: Blood pressure 130/82, pulse 80, temperature 97.6 F (36.4 C), temperature source Oral, height 5' 7.5" (1.715 m), weight 167 lb (75.8 kg). Patient is alert and in no acute distress. She is wearing a mask. Conjunctiva is pink. Sclera is nonicteric Oropharyngeal mucosa is normal. No neck masses or thyromegaly noted. Cardiac exam with regular rhythm normal S1 and S2. No murmur or gallop noted. Lungs are clear to auscultation. Abdomen is full but soft and nontender with organomegaly or masses. No LE edema or clubbing noted.  Labs/studies Results:   CBC Latest Ref Rng & Units 03/29/2020 01/10/2020 11/24/2019  WBC 3.4 - 10.8 x10E3/uL 7.5 8.3 6.8  Hemoglobin 11.1 - 15.9 g/dL 12.6 14.0 12.5  Hematocrit 34.0 - 46.6 % 37.8 39.9 37.9  Platelets 150 - 450 x10E3/uL 200 195 172    CMP Latest Ref Rng & Units 03/29/2020 01/10/2020 08/23/2019  Glucose 65 - 99 mg/dL 112(H) 133(H) 121(H)  BUN 8 - 27 mg/dL 13 26 15   Creatinine 0.57 - 1.00 mg/dL 0.65 0.83 0.99  Sodium 134 - 144 mmol/L 141 143 141  Potassium 3.5 - 5.2 mmol/L 4.5 4.1 3.9  Chloride 96 - 106 mmol/L 104 104 106  CO2 20 - 29 mmol/L 20 24 23   Calcium 8.7 - 10.3 mg/dL 9.3 9.4 9.6  Total Protein 6.0 - 8.5 g/dL 6.6 6.7 -  Total Bilirubin 0.0 - 1.2 mg/dL 0.3 0.4 -  Alkaline Phos 44 - 121 IU/L 74 74 -  AST 0 - 40 IU/L 19 18 -  ALT 0 - 32 IU/L 16 17 -    Hepatic Function Latest Ref Rng & Units 03/29/2020 01/10/2020 10/04/2014  Total Protein 6.0 - 8.5  g/dL 6.6 6.7 7.1  Albumin 3.6 - 4.6 g/dL 4.1 4.4 4.1  AST 0 - 40 IU/L 19 18 25   ALT 0 - 32 IU/L 16 17 17   Alk Phosphatase 44 - 121 IU/L 74 74 50  Total Bilirubin 0.0 - 1.2 mg/dL 0.3 0.4 0.9  Bilirubin, Direct 0.0 - 0.3 mg/dL - - -     Assessment:  #1.  Chronic constipation with dependence on OTC stimulant laxative at unusual high-dose.  She is requiring additional use of Amitiza/lubiprostone in order to have bowel movement.  She was referred to colorectal  surgeon few years ago and surgery was not recommended.  She has tried all other laxatives and did not work.  #2.  Chronic GERD.  She is doing well with omeprazole.  #3.  Esophageal dysphagia.  Patient is on alendronate but has not had any difficulty swallowing this pill.  She could have a Schatzki's ring stricture or motility disorder.  #4.  Choking spells.  She is at 2 choking spells.  Most recent episode occurred few weeks ago.  It appears she is having laryngeal spasm and I wonder if she is aspirating.  She has not been treated for bronchitis or pneumonia that she can think of.   Plan:  Patient will continue Amitiza as before and Senokot at lower dose if possible. Barium pill esophagogram. Office visit in 6 months.

## 2020-07-10 NOTE — Progress Notes (Signed)
Subjective:   Zoe [name] is a 85 y.o. female who presents for Medicare Annual (Subsequent) preventive examination.  Review of Systems          Objective:    There were no vitals filed for this visit. There is no height or weight on file to calculate BMI.  Advanced Directives 08/23/2019 02/11/2019 02/04/2017 10/04/2014 05/11/2014 05/05/2014 04/17/2014  Does Patient Have a Medical Advance Directive? Yes Yes Yes Yes Yes Yes Yes  Type of Paramedic of [place];Living will [place];Living will [place];Living will Living will Living will;Healthcare Power of Attorney Living will;Healthcare Power of Attorney Living will;Healthcare Power of Attorney  Does patient want to make changes to medical advance directive? No - Patient declined No - Patient declined - - No - Patient declined No - Patient declined No - Patient declined  Copy of [place] in Chart? No - copy requested - No - copy requested - No - copy requested No - copy requested No - copy requested  Pre-existing out of facility DNR order (yellow form or pink MOST form) - - - - - - -    Current Medications (verified) Outpatient Encounter Medications as of 07/10/2020  Medication Sig   ADVAIR DISKUS 250-50 MCG/DOSE AEPB Inhale 1 puff into the lungs 2 (two) times daily.   albuterol (PROVENTIL HFA;VENTOLIN HFA) 108 (90 BASE) MCG/ACT inhaler Inhale 2 puffs into the lungs every 6 (six) hours as needed for wheezing or shortness of breath.   alendronate (FOSAMAX) 70 MG tablet Take 1 tablet (70 mg total) by mouth every 7 (seven) days. Take with a full glass of water on an empty stomach. (Patient not taking: Reported on 07/10/2020)   allopurinol (ZYLOPRIM) 300 MG tablet Take 1 tablet (300 mg total) by mouth daily.   ALPRAZolam (XANAX) 0.5 MG tablet Take 1 tablet (0.5 mg total) by mouth 2 (two) times daily as needed for anxiety.   aspirin 81 MG tablet Take 81 mg by mouth  daily.   Biotin 5000 MCG CAPS Take 5,000 mcg by mouth daily.   calcium-vitamin D 250-100 MG-UNIT tablet Take 1 tablet by mouth 2 (two) times daily. Started 2 days ago   diclofenac Sodium (VOLTAREN) 1 % GEL APPLY 2-4 GRAMS TOPICALLY FOUR TIMES DAILY   digoxin (LANOXIN) 0.125 MG tablet Take 125 mcg by mouth daily.   doxepin (SINEQUAN) 100 MG capsule Take 100 mg by mouth at bedtime.   fexofenadine (ALLEGRA) 60 MG tablet Take 60 mg by mouth daily.    gabapentin (NEURONTIN) 300 MG capsule Take 1 capsule (300 mg total) by mouth 4 (four) times daily.   IBU 800 MG tablet Take 800 mg by mouth 3 (three) times daily. (Patient not taking: Reported on 07/10/2020)   ipratropium-albuterol (DUONEB) 0.5-2.5 (3) MG/3ML SOLN Inhale 3 mLs into the lungs every 4 (four) hours as needed (SOB).    lubiprostone (AMITIZA) 24 MCG capsule Take 1 capsule (24 mcg total) by mouth 2 (two) times daily with a meal.   Multiple Vitamin (MULTIVITAMIN) tablet Take 1 tablet by mouth daily.   naproxen (NAPROSYN) 500 MG tablet Take 500 mg by mouth 2 (two) times daily with a meal.    omeprazole (PRILOSEC) 20 MG capsule Take 20 mg by mouth daily.   pravastatin (PRAVACHOL) 20 MG tablet Take 20 mg by mouth every other day.   prednisoLONE acetate (PRED FORTE) 1 % ophthalmic suspension SMARTSIG:4 Drop(s) Right Ear Twice Daily PRN  senna (SENOKOT) 8.6 MG tablet Take 5 tablets (43 mg total) by mouth daily. (Patient taking differently: Take 5 tablets by mouth 2 (two) times daily.)   traZODone (DESYREL) 50 MG tablet Take 0.5-1 tablets (25-50 mg total) by mouth at bedtime as needed for sleep. (Patient not taking: Reported on 07/10/2020)   No facility-administered encounter medications on file as of 07/10/2020.    Allergies (verified) Codeine, Escitalopram, Other, Oxycodone, Paxil [paroxetine hcl], and Procaine   History: Past Medical History:  Diagnosis Date   Abdominal discomfort 11-02-12   suspected ?UTI- PCP MD placed on Cipro   Anxiety     Arthritis    Asthma    Cancer ([place])    skin CA   Chronic constipation    Depression    Dysrhythmia    tachycardia   Environmental allergies    Fibromyalgia    GERD (gastroesophageal reflux disease)    Hemorrhoids    Hypercholesteremia    Hypertension    Past Surgical History:  Procedure Laterality Date   ANAL RECTAL MANOMETRY N/A 11/28/2013   Procedure: ANO RECTAL MANOMETRY;  Surgeon: Leighton Ruff, MD;  Location: [facility];  Service: Endoscopy;  Laterality: N/A;   APPENDECTOMY     BACK SURGERY     X2    CHOLECYSTECTOMY     COLONOSCOPY  03/29/03   COLONOSCOPY N/A 12/09/2013   Procedure: COLONOSCOPY;  Surgeon: Rogene Houston, MD;  [facility];  Service: Endoscopy;  Laterality: N/A;  730   COLONOSCOPY N/A 02/04/2017   Procedure: COLONOSCOPY;  Surgeon: Rogene Houston, MD;  [facility];  Service: Endoscopy;  Laterality: N/A;  930   EYE SURGERY     bilateral cataract removal   IR KYPHO LUMBAR INC FX REDUCE BONE BX UNI/BIL CANNULATION INC/IMAGING  11/24/2019   IR KYPHO THORACIC WITH BONE BIOPSY  08/23/2019   IR KYPHO THORACIC WITH BONE BIOPSY  11/24/2019   IR RADIOLOGIST EVAL & MGMT  08/03/2019   IR RADIOLOGIST EVAL & MGMT  09/29/2019   IR RADIOLOGIST EVAL & MGMT  10/18/2019   IR RADIOLOGIST EVAL & MGMT  04/11/2020   SKIN CANCER EXCISION     RIGHT NECK     TONSILLECTOMY     T+A   TOTAL HIP ARTHROPLASTY Right 09/14/2012   Procedure: RIGHT TOTAL HIP ARTHROPLASTY ANTERIOR APPROACH and RIGHT KNEE STEROID INJECTION;  Surgeon: Mcarthur Rossetti, MD;  Location: [facility];  Service: Orthopedics;  Laterality: Right;   TOTAL HIP ARTHROPLASTY Left 11/05/2012   Procedure: LEFT TOTAL HIP ARTHROPLASTY ANTERIOR APPROACH;  Surgeon: Mcarthur Rossetti, MD;  Location: [facility];  Service: Orthopedics;  Laterality: Left;   UMBILICAL HERNIA REPAIR  04/26/03   JENKINS   UPPER GASTROINTESTINAL ENDOSCOPY  08/03/2009   NUR   UPPER GASTROINTESTINAL ENDOSCOPY  03/29/2003    EGD ED TCS   Family History  Problem Relation Age of Onset   Healthy Son    Healthy Son    Healthy Daughter    Colon cancer Neg Hx    Social History   Socioeconomic History   Marital status: Married    Spouse name: Not on file   Number of children: Not on file   Years of education: Not on file   Highest education level: Not on file  Occupational History   Not on file  Tobacco Use   Smoking status: Some Days    Packs/day: 0.25    Years: 50.00    Pack years: 12.50  Types: Cigarettes    Last attempt to quit: 11/10/2018    Years since quitting: 1.6   Smokeless tobacco: Never  Vaping Use   Vaping Use: Never used  Substance and Sexual Activity   Alcohol use: Yes    Alcohol/week: 0.0 standard drinks    Comment: rare   Drug use: No   Sexual activity: Not Currently    Birth control/protection: Post-menopausal  Other Topics Concern   Not on file  Social History Narrative   Not on file   Social Determinants of Health   Financial Resource Strain: Not on file  Food Insecurity: Not on file  Transportation Needs: Not on file  Physical Activity: Not on file  Stress: Not on file  Social Connections: Not on file    Tobacco Counseling Ready to quit: Not Answered Counseling given: Not Answered   Clinical Intake:                 Diabetic? no         Activities of Daily Living In your present state of health, do you have any difficulty performing the following activities: 01/10/2020 11/24/2019  Hearing? N N  Vision? N N  Difficulty concentrating or making decisions? N N  Walking or climbing stairs? Y -  Dressing or bathing? N N  Doing errands, shopping? N -  Some recent data might be hidden    Patient Care Team: Noreene Larsson, NP as PCP - General (Nurse Practitioner)  Indicate any recent Medical Services you may have received from other than [facility] providers in the past year (date may be approximate).     Assessment:   This is a routine  wellness examination for [facility].  Hearing/Vision screen No results found.  Dietary issues and exercise activities discussed:     Goals Addressed   None   Depression Screen PHQ 2/9 Scores 07/04/2020 06/19/2020 05/18/2020 04/03/2020 01/10/2020  PHQ - 2 Score 0 0 2 4 0  PHQ- 9 Score - - 8 7 -    Fall Risk Fall Risk  07/04/2020 06/19/2020 06/01/2020 05/18/2020 04/03/2020  Falls in the past year? 0 0 1 1 0  Comment - - - - -  Number falls in past yr: 0 0 0 0 0  Injury with Fall? 0 0 1 0 0  Comment - - exascerbated back/hip pain - -  Risk for fall due to : No Fall Risks - - Impaired balance/gait No Fall Risks  Follow up Falls evaluation completed - - - Falls evaluation completed    FALL RISK PREVENTION PERTAINING TO THE HOME:  Any stairs in or around the home? Yes  If so, are there any without handrails? No  Home free of loose throw rugs in walkways, pet beds, electrical cords, etc? Yes  Adequate lighting in your home to reduce risk of falls? Yes   ASSISTIVE DEVICES UTILIZED TO PREVENT FALLS:  Life alert? No  Use of a cane, walker or w/c? Yes  Grab bars in the bathroom? Yes  Shower chair or bench in shower? Yes  Elevated toilet seat or a handicapped toilet? Yes   TIMED UP AND GO:  Was the test performed? No .  Length of time to ambulate n/a   Cognitive Function:        Immunizations Immunization History  Administered Date(s) Administered   Influenza, High Dose Seasonal PF 11/05/2017   Influenza,inj,Quad PF,6+ Mos 02/11/2017   Influenza-Unspecified 12/12/2019   Zoster Recombinat (Shingrix) 02/11/2017, 04/17/2017  TDAP status: Due, Education has been provided regarding the importance of this vaccine. Advised may receive this vaccine at local pharmacy or Health Dept. Aware to provide a copy of the vaccination record if obtained from local pharmacy or Health Dept. Verbalized acceptance and understanding.  Flu Vaccine status: Up to date  Pneumococcal vaccine status: Declined,   Education has been provided regarding the importance of this vaccine but patient still declined. Advised may receive this vaccine at local pharmacy or Health Dept. Aware to provide a copy of the vaccination record if obtained from local pharmacy or Health Dept. Verbalized acceptance and understanding.   Covid-19 vaccine status: Completed vaccines  Qualifies for Shingles Vaccine? Yes   Zostavax completed No   Shingrix Completed?: No.    Education has been provided regarding the importance of this vaccine. Patient has been advised to call insurance company to determine out of pocket expense if they have not yet received this vaccine. Advised may also receive vaccine at local pharmacy or Health Dept. Verbalized acceptance and understanding.  Screening Tests Health Maintenance  Topic Date Due   COVID-19 Vaccine (1) Never done   TETANUS/TDAP  Never done   PNA vac Low Risk Adult (1 of 2 - PCV13) Never done   INFLUENZA VACCINE  08/27/2020   DEXA SCAN  Completed   Zoster Vaccines- Shingrix  Completed   HPV VACCINES  Aged Out    Health Maintenance  Health Maintenance Due  Topic Date Due   COVID-19 Vaccine (1) Never done   TETANUS/TDAP  Never done   PNA vac Low Risk Adult (1 of 2 - PCV13) Never done    Colorectal cancer screening: No longer required.   Mammogram status: No longer required due to age 3, declined.  Bone Density status: Completed 04/13/20. Results reflect: Bone density results: NORMAL. Repeat every 5 years.  Lung Cancer Screening: (Low Dose CT Chest recommended if Age 2-80 years, 30 pack-year currently smoking OR have quit w/in 15years.) does qualify.   Lung Cancer Screening Referral: Declined  Additional Screening:  Hepatitis C Screening: does not qualify; Completed  Vision Screening: Recommended annual ophthalmology exams for early detection of glaucoma and other disorders of the eye. Is the patient up to date with their annual eye exam?  Yes  Who is the provider  or what is the name of the office in which the patient attends annual eye exams? [place] If pt is not established with a provider, would they like to be referred to a provider to establish care?  N/a .   Dental Screening: Recommended annual dental exams for proper oral hygiene  Community Resource Referral / Chronic Care Management: CRR required this visit?  No   CCM required this visit?  No      Plan:     I have personally reviewed and noted the following in the patient's chart:   Medical and social history Use of alcohol, tobacco or illicit drugs  Current medications and supplements including opioid prescriptions.  Functional ability and status Nutritional status Physical activity Advanced directives List of other physicians Hospitalizations, surgeries, and ER visits in previous 12 months Vitals Screenings to include cognitive, depression, and falls Referrals and appointments  In addition, I have reviewed and discussed with patient certain preventive protocols, quality metrics, and best practice recommendations. A written personalized care plan for preventive services as well as general preventive health recommendations were provided to patient.     Laretta Bolster, [place]   [id number]   Nurse  Notes: AWV conducted by nurse in office. Patient gave consent for this visit. Patient and provider here in the office. Visit took 25 minutes to complete.

## 2020-07-10 NOTE — Patient Instructions (Signed)
Zoe [name] , Thank you for taking time to come for your Medicare Wellness Visit. I appreciate your ongoing commitment to your health goals. Please review the following plan we discussed and let me know if I can assist you in the future.   Screening recommendations/referrals: Colonoscopy: Not longer required Mammogram: Declined Bone Density: Complete Recommended yearly ophthalmology/optometry visit for glaucoma screening and checkup Recommended yearly dental visit for hygiene and checkup  Vaccinations: Influenza vaccine: Fall 2022 Pneumococcal vaccine: Declined Tdap vaccine: Declined Shingles vaccine: Declined    Advanced directives: Living Will  Conditions/risks identified: None  Next appointment: 07/16/20 @ 2:20 pm   Preventive Care 65 Years and Older, Female Preventive care refers to lifestyle choices and visits with your health care provider that can promote health and wellness. What does preventive care include? A yearly physical exam. This is also called an annual well check. Dental exams once or twice a year. Routine eye exams. Ask your health care provider how often you should have your eyes checked. Personal lifestyle choices, including: Daily care of your teeth and gums. Regular physical activity. Eating a healthy diet. Avoiding tobacco and drug use. Limiting alcohol use. Practicing safe sex. Taking low-dose aspirin every day. Taking vitamin and mineral supplements as recommended by your health care provider. What happens during an annual well check? The services and screenings done by your health care provider during your annual well check will depend on your age, overall health, lifestyle risk factors, and family history of disease. Counseling  Your health care provider may ask you questions about your: Alcohol use. Tobacco use. Drug use. Emotional well-being. Home and relationship well-being. Sexual activity. Eating habits. History of falls. Memory and  ability to understand (cognition). Work and work Statistician. Reproductive health. Screening  You may have the following tests or measurements: Height, weight, and BMI. Blood pressure. Lipid and cholesterol levels. These may be checked every 5 years, or more frequently if you are over 47 years old. Skin check. Lung cancer screening. You may have this screening every year starting at age 34 if you have a 30-pack-year history of smoking and currently smoke or have quit within the past 15 years. Fecal occult blood test (FOBT) of the stool. You may have this test every year starting at age 41. Flexible sigmoidoscopy or colonoscopy. You may have a sigmoidoscopy every 5 years or a colonoscopy every 10 years starting at age 28. Hepatitis C blood test. Hepatitis B blood test. Sexually transmitted disease (STD) testing. Diabetes screening. This is done by checking your blood sugar (glucose) after you have not eaten for a while (fasting). You may have this done every 1-3 years. Bone density scan. This is done to screen for osteoporosis. You may have this done starting at age 37. Mammogram. This may be done every 1-2 years. Talk to your health care provider about how often you should have regular mammograms. Talk with your health care provider about your test results, treatment options, and if necessary, the need for more tests. Vaccines  Your health care provider may recommend certain vaccines, such as: Influenza vaccine. This is recommended every year. Tetanus, diphtheria, and acellular pertussis (Tdap, Td) vaccine. You may need a Td booster every 10 years. Zoster vaccine. You may need this after age 56. Pneumococcal 13-valent conjugate (PCV13) vaccine. One dose is recommended after age 53. Pneumococcal polysaccharide (PPSV23) vaccine. One dose is recommended after age 42. Talk to your health care provider about which screenings and vaccines you need and how often  you need them. This information is  not intended to replace advice given to you by your health care provider. Make sure you discuss any questions you have with your health care provider. Document Released: 02/09/2015 Document Revised: 10/03/2015 Document Reviewed: 11/14/2014 Elsevier Interactive Patient Education  2017 [place] Prevention in the Home Falls can cause injuries. They can happen to people of all ages. There are many things you can do to make your home safe and to help prevent falls. What can I do on the outside of my home? Regularly fix the edges of walkways and driveways and fix any cracks. Remove anything that might make you trip as you walk through a door, such as a raised step or threshold. Trim any bushes or trees on the path to your home. Use bright outdoor lighting. Clear any walking paths of anything that might make someone trip, such as rocks or tools. Regularly check to see if handrails are loose or broken. Make sure that both sides of any steps have handrails. Any raised decks and porches should have guardrails on the edges. Have any leaves, snow, or ice cleared regularly. Use sand or salt on walking paths during winter. Clean up any spills in your garage right away. This includes oil or grease spills. What can I do in the bathroom? Use night lights. Install grab bars by the toilet and in the tub and shower. Do not use towel bars as grab bars. Use non-skid mats or decals in the tub or shower. If you need to sit down in the shower, use a plastic, non-slip stool. Keep the floor dry. Clean up any water that spills on the floor as soon as it happens. Remove soap buildup in the tub or shower regularly. Attach bath mats securely with double-sided non-slip rug tape. Do not have throw rugs and other things on the floor that can make you trip. What can I do in the bedroom? Use night lights. Make sure that you have a light by your bed that is easy to reach. Do not use any sheets or blankets that  are too big for your bed. They should not hang down onto the floor. Have a firm chair that has side arms. You can use this for support while you get dressed. Do not have throw rugs and other things on the floor that can make you trip. What can I do in the kitchen? Clean up any spills right away. Avoid walking on wet floors. Keep items that you use a lot in easy-to-reach places. If you need to reach something above you, use a strong step stool that has a grab bar. Keep electrical cords out of the way. Do not use floor polish or wax that makes floors slippery. If you must use wax, use non-skid floor wax. Do not have throw rugs and other things on the floor that can make you trip. What can I do with my stairs? Do not leave any items on the stairs. Make sure that there are handrails on both sides of the stairs and use them. Fix handrails that are broken or loose. Make sure that handrails are as long as the stairways. Check any carpeting to make sure that it is firmly attached to the stairs. Fix any carpet that is loose or worn. Avoid having throw rugs at the top or bottom of the stairs. If you do have throw rugs, attach them to the floor with carpet tape. Make sure that you have a light  switch at the top of the stairs and the bottom of the stairs. If you do not have them, ask someone to add them for you. What else can I do to help prevent falls? Wear shoes that: Do not have high heels. Have rubber bottoms. Are comfortable and fit you well. Are closed at the toe. Do not wear sandals. If you use a stepladder: Make sure that it is fully opened. Do not climb a closed stepladder. Make sure that both sides of the stepladder are locked into place. Ask someone to hold it for you, if possible. Clearly mark and make sure that you can see: Any grab bars or handrails. First and last steps. Where the edge of each step is. Use tools that help you move around (mobility aids) if they are needed. These  include: Canes. Walkers. Scooters. Crutches. Turn on the lights when you go into a dark area. Replace any light bulbs as soon as they burn out. Set up your furniture so you have a clear path. Avoid moving your furniture around. If any of your floors are uneven, fix them. If there are any pets around you, be aware of where they are. Review your medicines with your doctor. Some medicines can make you feel dizzy. This can increase your chance of falling. Ask your doctor what other things that you can do to help prevent falls. This information is not intended to replace advice given to you by your health care provider. Make sure you discuss any questions you have with your health care provider. Document Released: 11/09/2008 Document Revised: 06/21/2015 Document Reviewed: 02/17/2014 Elsevier Interactive Patient Education  2017 [employer].

## 2020-07-10 NOTE — Patient Instructions (Signed)
Physician will call with results of barium study when completed. 

## 2020-07-13 ENCOUNTER — Ambulatory Visit (HOSPITAL_COMMUNITY)
Admission: RE | Admit: 2020-07-13 | Discharge: 2020-07-13 | Disposition: A | Discharge status: Home / Self Care | Payer: PPO | Source: Ambulatory Visit | Attending: Internal Medicine | Admitting: Internal Medicine

## 2020-07-13 DIAGNOSIS — R1310 Dysphagia, unspecified: Secondary | ICD-10-CM | POA: Diagnosis not present

## 2020-07-13 DIAGNOSIS — R1319 Other dysphagia: Secondary | ICD-10-CM | POA: Insufficient documentation

## 2020-07-16 ENCOUNTER — Ambulatory Visit (INDEPENDENT_AMBULATORY_CARE_PROVIDER_SITE_OTHER): Payer: PPO | Admitting: Nurse Practitioner

## 2020-07-16 ENCOUNTER — Encounter: Payer: Self-pay | Admitting: Nurse Practitioner

## 2020-07-16 ENCOUNTER — Other Ambulatory Visit: Payer: Self-pay

## 2020-07-16 VITALS — BP 107/67 | HR 73 | Ht 67.5 in | Wt 167.0 lb

## 2020-07-16 DIAGNOSIS — H9191 Unspecified hearing loss, right ear: Secondary | ICD-10-CM | POA: Diagnosis not present

## 2020-07-16 DIAGNOSIS — Z9109 Other allergy status, other than to drugs and biological substances: Secondary | ICD-10-CM

## 2020-07-16 MED ORDER — CETIRIZINE HCL 10 MG PO TABS: 5.0000 mg | ORAL_TABLET | Freq: Every day | ORAL | 1 refills | Status: DC

## 2020-07-16 NOTE — Progress Notes (Signed)
Acute Office Visit  Subjective:    Patient ID: Zoe [name], female    DOB: 1935-05-22, 85 y.o.   MRN: [id number]  Chief Complaint  Patient presents with   Dizziness    Ongoing for awhile, thinks it may be related to her ears.    HPI Patient is in today for decreased hearing in her right ear that started about a week ago. She gets cerumen impactions, and wanted to get any wax irrigated.  She states that she has drainage in her throat all the time. She takes Human resources officer, and she had been taking this for years.  Past Medical History:  Diagnosis Date   Abdominal discomfort 11-02-12   suspected ?UTI- PCP MD placed on Cipro   Anxiety    Arthritis    Asthma    Cancer ([place])    skin CA   Chronic constipation    Depression    Dysrhythmia    tachycardia   Environmental allergies    Fibromyalgia    GERD (gastroesophageal reflux disease)    Hemorrhoids    Hypercholesteremia    Hypertension     Past Surgical History:  Procedure Laterality Date   ANAL RECTAL MANOMETRY N/A 11/28/2013   Procedure: ANO RECTAL MANOMETRY;  Surgeon: Leighton Ruff, MD;  [facility];  Service: Endoscopy;  Laterality: N/A;   APPENDECTOMY     BACK SURGERY     X2    CHOLECYSTECTOMY     COLONOSCOPY  03/29/03   COLONOSCOPY N/A 12/09/2013   Procedure: COLONOSCOPY;  Surgeon: Rogene Houston, MD;  [facility];  Service: Endoscopy;  Laterality: N/A;  730   COLONOSCOPY N/A 02/04/2017   Procedure: COLONOSCOPY;  Surgeon: Rogene Houston, MD;  [facility];  Service: Endoscopy;  Laterality: N/A;  930   EYE SURGERY     bilateral cataract removal   IR KYPHO LUMBAR INC FX REDUCE BONE BX UNI/BIL CANNULATION INC/IMAGING  11/24/2019   IR KYPHO THORACIC WITH BONE BIOPSY  08/23/2019   IR KYPHO THORACIC WITH BONE BIOPSY  11/24/2019   IR RADIOLOGIST EVAL & MGMT  08/03/2019   IR RADIOLOGIST EVAL & MGMT  09/29/2019   IR RADIOLOGIST EVAL & MGMT  10/18/2019   IR RADIOLOGIST EVAL & MGMT   04/11/2020   SKIN CANCER EXCISION     RIGHT NECK     TONSILLECTOMY     T+A   TOTAL HIP ARTHROPLASTY Right 09/14/2012   Procedure: RIGHT TOTAL HIP ARTHROPLASTY ANTERIOR APPROACH and RIGHT KNEE STEROID INJECTION;  Surgeon: Mcarthur Rossetti, MD;  Location: [place];  Service: Orthopedics;  Laterality: Right;   TOTAL HIP ARTHROPLASTY Left 11/05/2012   Procedure: LEFT TOTAL HIP ARTHROPLASTY ANTERIOR APPROACH;  Surgeon: Mcarthur Rossetti, MD;  [facility];  Service: Orthopedics;  Laterality: Left;   UMBILICAL HERNIA REPAIR  04/26/03   JENKINS   UPPER GASTROINTESTINAL ENDOSCOPY  08/03/2009   NUR   UPPER GASTROINTESTINAL ENDOSCOPY  03/29/2003   EGD ED TCS    Family History  Problem Relation Age of Onset   Healthy Son    Healthy Son    Healthy Daughter    Colon cancer Neg Hx     Social History   Socioeconomic History   Marital status: Married    Spouse name: Not on file   Number of children: Not on file   Years of education: Not on file   Highest education level: Not on file  Occupational History  Not on file  Tobacco Use   Smoking status: Some Days    Packs/day: 0.25    Years: 50.00    Pack years: 12.50    Types: Cigarettes    Last attempt to quit: 11/10/2018    Years since quitting: 1.6   Smokeless tobacco: Never  Vaping Use   Vaping Use: Never used  Substance and Sexual Activity   Alcohol use: Yes    Alcohol/week: 0.0 standard drinks    Comment: rare   Drug use: No   Sexual activity: Not Currently    Birth control/protection: Post-menopausal  Other Topics Concern   Not on file  Social History Narrative   Not on file   Social Determinants of Health   Financial Resource Strain: Low Risk    Difficulty of Paying Living Expenses: Not hard at all  Food Insecurity: No Food Insecurity   Worried About Charity fundraiser in the Last Year: Never true   [place] in the Last Year: Never true  Transportation Needs: No Transportation Needs   Lack of  Transportation (Medical): No   Lack of Transportation (Non-Medical): No  Physical Activity: Inactive   Days of Exercise per Week: 0 days   Minutes of Exercise per Session: 0 min  Stress: No Stress Concern Present   Feeling of Stress : Only a little  Social Connections: Moderately Integrated   Frequency of Communication with Friends and Family: More than three times a week   Frequency of Social Gatherings with Friends and Family: Three times a week   Attends Religious Services: 1 to 4 times per year   Active Member of Clubs or Organizations: No   Attends Archivist Meetings: Never   Marital Status: Married  Human resources officer Violence: Not At Risk   Fear of Current or Ex-Partner: No   Emotionally Abused: No   Physically Abused: No   Sexually Abused: No    Outpatient Medications Prior to Visit  Medication Sig Dispense Refill   ADVAIR DISKUS 250-50 MCG/DOSE AEPB Inhale 1 puff into the lungs 2 (two) times daily.     albuterol (PROVENTIL HFA;VENTOLIN HFA) 108 (90 BASE) MCG/ACT inhaler Inhale 2 puffs into the lungs every 6 (six) hours as needed for wheezing or shortness of breath.     alendronate (FOSAMAX) 70 MG tablet Take 1 tablet (70 mg total) by mouth every 7 (seven) days. Take with a full glass of water on an empty stomach. 4 tablet 11   allopurinol (ZYLOPRIM) 300 MG tablet Take 1 tablet (300 mg total) by mouth daily. 90 tablet 1   ALPRAZolam (XANAX) 0.5 MG tablet Take 1 tablet (0.5 mg total) by mouth 2 (two) times daily as needed for anxiety. 60 tablet 2   aspirin 81 MG tablet Take 81 mg by mouth daily.     Biotin 5000 MCG CAPS Take 5,000 mcg by mouth daily.     calcium-vitamin D 250-100 MG-UNIT tablet Take 1 tablet by mouth 2 (two) times daily. Started 2 days ago     diclofenac Sodium (VOLTAREN) 1 % GEL APPLY 2-4 GRAMS TOPICALLY FOUR TIMES DAILY 200 g 3   digoxin (LANOXIN) 0.125 MG tablet Take 125 mcg by mouth daily.     doxepin (SINEQUAN) 100 MG capsule Take 100 mg by mouth  at bedtime.     gabapentin (NEURONTIN) 300 MG capsule Take 1 capsule (300 mg total) by mouth 4 (four) times daily. 360 capsule 1   IBU 800 MG tablet  Take 800 mg by mouth 3 (three) times daily.     ipratropium-albuterol (DUONEB) 0.5-2.5 (3) MG/3ML SOLN Inhale 3 mLs into the lungs every 4 (four) hours as needed (SOB).      lubiprostone (AMITIZA) 24 MCG capsule Take 1 capsule (24 mcg total) by mouth 2 (two) times daily with a meal. 60 capsule 1   Multiple Vitamin (MULTIVITAMIN) tablet Take 1 tablet by mouth daily.     naproxen (NAPROSYN) 500 MG tablet Take 500 mg by mouth 2 (two) times daily with a meal.      omeprazole (PRILOSEC) 20 MG capsule Take 20 mg by mouth daily.     pravastatin (PRAVACHOL) 20 MG tablet Take 20 mg by mouth every other day.     prednisoLONE acetate (PRED FORTE) 1 % ophthalmic suspension SMARTSIG:4 Drop(s) Right Ear Twice Daily PRN     senna (SENOKOT) 8.6 MG tablet Take 5 tablets (43 mg total) by mouth daily. (Patient taking differently: Take 5 tablets by mouth 2 (two) times daily.)     traZODone (DESYREL) 50 MG tablet Take 0.5-1 tablets (25-50 mg total) by mouth at bedtime as needed for sleep. 30 tablet 3   fexofenadine (ALLEGRA) 60 MG tablet Take 60 mg by mouth daily.      No facility-administered medications prior to visit.    Allergies  Allergen Reactions   Codeine Other (See Comments)    Pains in abdominal area   Escitalopram Itching   Other     NOVACAINE     ITCHING Anti-depressants   Oxycodone     Abdominal pain   Paxil [Paroxetine Hcl] Itching   Procaine Itching    Review of Systems  Constitutional: Negative.   HENT:  Positive for congestion, hearing loss and postnasal drip.   Respiratory: Negative.    Cardiovascular: Negative.       Objective:    Physical Exam Constitutional:      Appearance: Normal appearance.  HENT:     Right Ear: Tympanic membrane, ear canal and external ear normal.     Left Ear: Tympanic membrane, ear canal and external  ear normal.     Mouth/Throat:     Mouth: Mucous membranes are moist.     Pharynx: Oropharynx is clear.  Cardiovascular:     Rate and Rhythm: Normal rate and regular rhythm.     Pulses: Normal pulses.     Heart sounds: Normal heart sounds.  Pulmonary:     Effort: Pulmonary effort is normal.     Breath sounds: Normal breath sounds.  Neurological:     Mental Status: She is alert.    BP 107/67 (BP Location: Right Arm, Patient Position: Sitting, Cuff Size: Normal)   Pulse 73   Ht 5' 7.5" (1.715 m)   Wt 167 lb (75.8 kg)   SpO2 95%   BMI 25.77 kg/m  Wt Readings from Last 3 Encounters:  07/16/20 167 lb (75.8 kg)  07/10/20 167 lb (75.8 kg)  07/10/20 167 lb (75.8 kg)    Health Maintenance Due  Topic Date Due   PNA vac Low Risk Adult (1 of 2 - PCV13) Never done    There are no preventive care reminders to display for this patient.   No results found for: TSH Lab Results  Component Value Date   WBC 7.5 03/29/2020   HGB 12.6 03/29/2020   HCT 37.8 03/29/2020   MCV 96 03/29/2020   PLT 200 03/29/2020   Lab Results  Component Value Date  NA 141 03/29/2020   K 4.5 03/29/2020   CO2 20 03/29/2020   GLUCOSE 112 (H) 03/29/2020   BUN 13 03/29/2020   CREATININE 0.65 03/29/2020   BILITOT 0.3 03/29/2020   ALKPHOS 74 03/29/2020   AST 19 03/29/2020   ALT 16 03/29/2020   PROT 6.6 03/29/2020   ALBUMIN 4.1 03/29/2020   CALCIUM 9.3 03/29/2020   ANIONGAP 12 08/23/2019   EGFR 88 03/29/2020   Lab Results  Component Value Date   CHOL 155 03/29/2020   Lab Results  Component Value Date   HDL 49 03/29/2020   Lab Results  Component Value Date   LDLCALC 80 03/29/2020   Lab Results  Component Value Date   TRIG 150 (H) 03/29/2020   Lab Results  Component Value Date   CHOLHDL 3.2 03/29/2020   Lab Results  Component Value Date   HGBA1C 5.0 03/29/2020       Assessment & Plan:   Problem List Items Addressed This Visit       Nervous and Auditory   Hearing loss, right     -affecting right ear -has had increased PND and has been on allegra for years -STOP allegra -Rx. Cetirizine -if no better in 1 week will consider ENT consult; she is followed by Dr. Benjamine Mola currently,and she would like to see a different ENT if she decides she wants to see a specialist; she declines today       Other Visit Diagnoses     Environmental allergies    -  Primary   Relevant Medications   cetirizine (ZYRTEC) 10 MG tablet        Meds ordered this encounter  Medications   cetirizine (ZYRTEC) 10 MG tablet    Sig: Take 0.5 tablets (5 mg total) by mouth daily.    Dispense:  45 tablet    Refill:  [place], NP

## 2020-07-16 NOTE — Assessment & Plan Note (Signed)
-  affecting right ear -has had increased PND and has been on allegra for years -STOP allegra -Rx. Cetirizine -if no better in 1 week will consider ENT consult; she is followed by Dr. Benjamine Mola currently,and she would like to see a different ENT if she decides she wants to see a specialist; she declines today

## 2020-07-16 NOTE — Patient Instructions (Signed)
If you aren't better in a week after taking cetirizine, let me know and I will send in a referral for you.

## 2020-07-16 NOTE — Addendum Note (Signed)
Addended by: Laretta Bolster on: 07/16/2020 02:51 PM   Modules accepted: Orders

## 2020-07-17 ENCOUNTER — Other Ambulatory Visit (INDEPENDENT_AMBULATORY_CARE_PROVIDER_SITE_OTHER): Payer: Self-pay

## 2020-07-17 DIAGNOSIS — R1319 Other dysphagia: Secondary | ICD-10-CM

## 2020-07-17 NOTE — Progress Notes (Signed)
Referral placed for speech pathologist Patient's PCP is on EPIC

## 2020-07-23 ENCOUNTER — Telehealth: Payer: Self-pay

## 2020-07-23 ENCOUNTER — Other Ambulatory Visit: Payer: Self-pay

## 2020-07-23 ENCOUNTER — Other Ambulatory Visit (HOSPITAL_COMMUNITY): Payer: Self-pay | Admitting: Specialist

## 2020-07-23 DIAGNOSIS — R1312 Dysphagia, oropharyngeal phase: Secondary | ICD-10-CM

## 2020-07-23 DIAGNOSIS — H9191 Unspecified hearing loss, right ear: Secondary | ICD-10-CM

## 2020-07-23 DIAGNOSIS — K219 Gastro-esophageal reflux disease without esophagitis: Secondary | ICD-10-CM

## 2020-07-23 NOTE — Telephone Encounter (Signed)
Referral sent 

## 2020-07-23 NOTE — Telephone Encounter (Signed)
Patient called changed her mind would like a referral for ENT Dr Melony Overly.

## 2020-07-31 ENCOUNTER — Ambulatory Visit: Payer: PPO | Admitting: Physical Medicine & Rehabilitation

## 2020-08-02 ENCOUNTER — Encounter: Payer: PPO | Attending: Physical Medicine & Rehabilitation | Admitting: Physical Medicine & Rehabilitation

## 2020-08-02 ENCOUNTER — Other Ambulatory Visit: Payer: Self-pay

## 2020-08-02 ENCOUNTER — Encounter: Payer: Self-pay | Admitting: Physical Medicine & Rehabilitation

## 2020-08-02 VITALS — BP 126/74 | HR 77 | Temp 97.8°F | Ht 67.5 in | Wt 169.0 lb

## 2020-08-02 DIAGNOSIS — I739 Peripheral vascular disease, unspecified: Secondary | ICD-10-CM | POA: Diagnosis not present

## 2020-08-02 DIAGNOSIS — M47817 Spondylosis without myelopathy or radiculopathy, lumbosacral region: Secondary | ICD-10-CM | POA: Insufficient documentation

## 2020-08-02 MED ORDER — TRAMADOL HCL 50 MG PO TABS: 50.0000 mg | ORAL_TABLET | Freq: Four times a day (QID) | ORAL | 0 refills | Status: AC | PRN

## 2020-08-02 NOTE — Patient Instructions (Signed)

## 2020-08-02 NOTE — Progress Notes (Addendum)
Bilateral Lumbar L3, L4  medial branch blocks and L 5 dorsal ramus injection under fluoroscopic guidance  Indication: Lumbar pain which is not relieved by medication management or other conservative care and interfering with self-care and mobility.  Informed consent was obtained after describing risks and benefits of the procedure with the patient, this includes bleeding, infection, paralysis and medication side effects.  The patient wishes to proceed and has given written consent.  The patient was placed in prone position. Right site and RIght side confirmed after time out. The lumbar area was marked and prepped with Betadine.  One mL of 1% lidocaine was injected into each of 6 areas into the skin and subcutaneous tissue.  Then a 22-gauge 3.5 in spinal needle was inserted targeting the junction of the left S1 superior articular process and sacral ala junction. Needle was advanced under fluoroscopic guidance.  Bone contact was made.  Isovue 200 was injected x 0.5 mL demonstrating no intravascular uptake.  Then a solution  of 2% MPF lidocaine was injected x 0.5 mL.  Then the left L5 superior articular process in transverse process junction was targeted.  Bone contact was made.  Isovue 200 was injected x 0.5 mL demonstrating no intravascular uptake. Then a solution containing  2% MPF lidocaine was injected x 0.5 mL.  Then the left L4 superior articular process in transverse process junction was targeted.  Bone contact was made.  Isovue 200 was injected x 0.5 mL demonstrating no intravascular uptake.  Then a solution containing2% MPF lidocaine was injected x 0.5 mL.  This same procedure was performed on the right side using the same needle, technique and injectate.  Patient tolerated procedure well.  Post procedure instructions were given.  Pre injection back pain 7/10 Post injection back pain 2/10

## 2020-08-02 NOTE — Progress Notes (Signed)
  PROCEDURE RECORD [facility]   Name: Zoe [name] DOB:21-Jun-1935 MRN: [id number]  Date:08/02/2020  Physician: Alysia Penna, MD    Nurse/CMA: Franchot Gallo MA  Allergies:  Allergies  Allergen Reactions   Codeine Other (See Comments)    Pains in abdominal area   Escitalopram Itching   Other     NOVACAINE     ITCHING Anti-depressants   Oxycodone     Abdominal pain   Paxil [Paroxetine Hcl] Itching   Procaine Itching    Consent Signed: Yes.    Is patient diabetic? No.  CBG today? N/A  Pregnant: No. LMP: No LMP recorded. Patient is postmenopausal. (age 72-55)  Anticoagulants: no Anti-inflammatory: yes (Naprosyn) Antibiotics: no  Procedure: Bilaterl Lumbar L3,4,5 Medial Branch Block  Position: Prone Start Time: 1:48pm End Time:1:59pm  Fluoro Time: 51  RN/CMA Zoe Esbenshade MA Zoe Blancett MA    Time 1:29 pm 1:58pm    BP 126/74 147/78    Pulse 77 76    Respirations 16 16    O2 Sat 94 97    S/S 6 6    Pain Level 7/10 2/10     D/C home with Spouse, patient A & O X 3, D/C instructions reviewed, and sits independently.

## 2020-08-08 ENCOUNTER — Other Ambulatory Visit (INDEPENDENT_AMBULATORY_CARE_PROVIDER_SITE_OTHER): Payer: Self-pay | Admitting: Internal Medicine

## 2020-08-08 ENCOUNTER — Encounter (HOSPITAL_COMMUNITY): Payer: Self-pay | Admitting: Speech Pathology

## 2020-08-08 ENCOUNTER — Ambulatory Visit (HOSPITAL_COMMUNITY): Payer: PPO | Attending: Internal Medicine | Admitting: Speech Pathology

## 2020-08-08 ENCOUNTER — Other Ambulatory Visit: Payer: Self-pay

## 2020-08-08 ENCOUNTER — Ambulatory Visit (HOSPITAL_COMMUNITY)
Admission: RE | Admit: 2020-08-08 | Discharge: 2020-08-08 | Disposition: A | Discharge status: Home / Self Care | Payer: PPO | Source: Ambulatory Visit | Attending: Internal Medicine | Admitting: Internal Medicine

## 2020-08-08 DIAGNOSIS — R1310 Dysphagia, unspecified: Secondary | ICD-10-CM | POA: Diagnosis not present

## 2020-08-08 DIAGNOSIS — R1312 Dysphagia, oropharyngeal phase: Secondary | ICD-10-CM | POA: Insufficient documentation

## 2020-08-08 DIAGNOSIS — K219 Gastro-esophageal reflux disease without esophagitis: Secondary | ICD-10-CM | POA: Diagnosis not present

## 2020-08-08 DIAGNOSIS — R1319 Other dysphagia: Secondary | ICD-10-CM | POA: Diagnosis not present

## 2020-08-08 NOTE — Therapy (Signed)
[place], [place], [address] Phone: [phone]   Fax:  [phone]  Modified Barium Swallow  Patient Details  Name: Zoe [name] MRN: [id number] Date of Birth: [birth year] No data recorded  Encounter Date: 08/08/2020   End of Session - 08/08/20 1522     Visit Number 1    Number of Visits 1    Activity Tolerance Patient tolerated treatment well            Past Medical History:  Past Medical History:  Diagnosis Date   Abdominal discomfort 11-02-12   suspected ?UTI- PCP MD placed on Cipro   Anxiety    Arthritis    Asthma    Cancer ([place])    skin CA   Chronic constipation    Depression    Dysrhythmia    tachycardia   Environmental allergies    Fibromyalgia    GERD (gastroesophageal reflux disease)    Hemorrhoids    Hypercholesteremia    Hypertension    Past Surgical History:  Past Surgical History:  Procedure Laterality Date   ANAL RECTAL MANOMETRY N/A 11/28/2013   Procedure: ANO RECTAL MANOMETRY;  Surgeon: Leighton Ruff, MD;  [facility];  Service: Endoscopy;  Laterality: N/A;   APPENDECTOMY     BACK SURGERY     X2    CHOLECYSTECTOMY     COLONOSCOPY  03/29/03   COLONOSCOPY N/A 12/09/2013   Procedure: COLONOSCOPY;  Surgeon: Rogene Houston, MD;  [facility];  Service: Endoscopy;  Laterality: N/A;  730   COLONOSCOPY N/A 02/04/2017   Procedure: COLONOSCOPY;  Surgeon: Rogene Houston, MD;  [facility];  Service: Endoscopy;  Laterality: N/A;  930   EYE SURGERY     bilateral cataract removal   IR KYPHO LUMBAR INC FX REDUCE BONE BX UNI/BIL CANNULATION INC/IMAGING  11/24/2019   IR KYPHO THORACIC WITH BONE BIOPSY  08/23/2019   IR KYPHO THORACIC WITH BONE BIOPSY  11/24/2019   IR RADIOLOGIST EVAL & MGMT  08/03/2019   IR RADIOLOGIST EVAL & MGMT  09/29/2019   IR RADIOLOGIST EVAL & MGMT  10/18/2019   IR RADIOLOGIST EVAL & MGMT  04/11/2020   SKIN CANCER EXCISION     RIGHT  NECK     TONSILLECTOMY     T+A   TOTAL HIP ARTHROPLASTY Right 09/14/2012   Procedure: RIGHT TOTAL HIP ARTHROPLASTY ANTERIOR APPROACH and RIGHT KNEE STEROID INJECTION;  Surgeon: Mcarthur Rossetti, MD;  Location: [place];  Service: Orthopedics;  Laterality: Right;   TOTAL HIP ARTHROPLASTY Left 11/05/2012   Procedure: LEFT TOTAL HIP ARTHROPLASTY ANTERIOR APPROACH;  Surgeon: Mcarthur Rossetti, MD;  [facility];  Service: Orthopedics;  Laterality: Left;   UMBILICAL HERNIA REPAIR  04/26/03   JENKINS   UPPER GASTROINTESTINAL ENDOSCOPY  08/03/2009   NUR   UPPER GASTROINTESTINAL ENDOSCOPY  03/29/2003   EGD ED TCS   HPI: [name] is an 85 y/o female with lifelong constipation dependent on high dose OTC laxatives, chronic GERD hx of colonic adenomas, hx of hiatal hernia and esophageal dysphagia. At her recent GI visit, Pt complained of swallowing difficulty which started in early May. Pt reports she has episodes when she "cannot breathe" this has happened 3 times; 1 episode was with food and 2 of these episodes occured when she was "just sitting there". She also reports episodes when she feels food gets stuck, she reports she stands up coughs and  usually regurgitates or expectorates large amounts of phlegm. Pt further reports almost constant globus sensation and reports other symptoms that sound questionably esophageal.   No data recorded   Assessment / Plan / Recommendation  CHL IP CLINICAL IMPRESSIONS 08/08/2020  Clinical Impression Pt's swallowing today presents to be within functional limits. Pt presents with occasional flash penetration of thin liquids with no aspiration. Oral stage was unremarkable. Note good laryngeal excursion. Question slightly decreased pharyngeal squeeze resulting in trace valleculae and pyriform residue across presentations, reflexive repeat swallow effectively clears residue. Pt consumed barium tablet with thin liquids without incident - esophageal sweep  was unremarkable. SLP reviewed universal aspriation precautions with the Pt. Pt reports consistent globus sensation and that when she coughs or clears her throat that she ofted expectorates large amounts of phlegm. Recommend continue with a regular diet and thin liquids; meds are ok whole with liquids. Recommend defer to GI for further treatment and recommendations for esophageal dysphagia. Thank you,  SLP Visit Diagnosis Dysphagia, unspecified (R13.10)  Attention and concentration deficit following --  Frontal lobe and executive function deficit following --  Impact on safety and function --      CHL IP TREATMENT RECOMMENDATION 08/08/2020  Treatment Recommendations No treatment recommended at this time     No flowsheet data found.  CHL IP DIET RECOMMENDATION 08/08/2020  SLP Diet Recommendations Regular solids;Thin liquid  Liquid Administration via Cup;Straw  Medication Administration Whole meds with liquid  Compensations Minimize environmental distractions;Slow rate;Small sips/bites  Postural Changes Seated upright at 90 degrees;Remain semi-upright after after feeds/meals (Comment)      CHL IP OTHER RECOMMENDATIONS 08/08/2020  Recommended Consults --  Oral Care Recommendations Oral care BID  Other Recommendations --      CHL IP FOLLOW UP RECOMMENDATIONS 08/08/2020  Follow up Recommendations None      No flowsheet data found.         CHL IP ORAL PHASE 08/08/2020  Oral Phase WFL  Oral - Pudding Teaspoon --  Oral - Pudding Cup --  Oral - Honey Teaspoon --  Oral - Honey Cup --  Oral - Nectar Teaspoon --  Oral - Nectar Cup --  Oral - Nectar Straw --  Oral - Thin Teaspoon --  Oral - Thin Cup --  Oral - Thin Straw --  Oral - Puree --  Oral - Mech Soft --  Oral - Regular --  Oral - Multi-Consistency --  Oral - Pill --  Oral Phase - Comment --    CHL IP PHARYNGEAL PHASE 08/08/2020  Pharyngeal Phase WFL  Pharyngeal- Pudding Teaspoon --  Pharyngeal --  Pharyngeal- Pudding Cup  --  Pharyngeal --  Pharyngeal- Honey Teaspoon --  Pharyngeal --  Pharyngeal- Honey Cup --  Pharyngeal --  Pharyngeal- Nectar Teaspoon --  Pharyngeal --  Pharyngeal- Nectar Cup --  Pharyngeal --  Pharyngeal- Nectar Straw --  Pharyngeal --  Pharyngeal- Thin Teaspoon --  Pharyngeal --  Pharyngeal- Thin Cup --  Pharyngeal --  Pharyngeal- Thin Straw --  Pharyngeal --  Pharyngeal- Puree --  Pharyngeal --  Pharyngeal- Mechanical Soft --  Pharyngeal --  Pharyngeal- Regular --  Pharyngeal --  Pharyngeal- Multi-consistency --  Pharyngeal --  Pharyngeal- Pill --  Pharyngeal --  Pharyngeal Comment --     CHL IP CERVICAL ESOPHAGEAL PHASE 08/08/2020  Cervical Esophageal Phase WFL  Pudding Teaspoon --  Pudding Cup --  Honey Teaspoon --  Honey Cup --  Nectar Teaspoon --  Nectar Cup --  Nectar Straw --  Thin Teaspoon --  Thin Cup --  Thin Straw --  Puree --  Mechanical Soft --  Regular --  Multi-consistency --  Pill --  Cervical Esophageal Comment --   Adya Wirz H. Roddie Mc, CCC-SLP Speech Language Pathologist   Wende Bushy 08/08/2020, 3:23 PM

## 2020-08-24 ENCOUNTER — Encounter: Payer: Self-pay | Admitting: Physical Medicine & Rehabilitation

## 2020-08-24 ENCOUNTER — Other Ambulatory Visit: Payer: Self-pay

## 2020-08-24 ENCOUNTER — Other Ambulatory Visit: Payer: Self-pay | Admitting: Nurse Practitioner

## 2020-08-24 ENCOUNTER — Encounter: Payer: PPO | Admitting: Physical Medicine & Rehabilitation

## 2020-08-24 ENCOUNTER — Telehealth: Payer: Self-pay

## 2020-08-24 VITALS — BP 116/77 | HR 69 | Temp 98.0°F | Ht 67.5 in | Wt 170.4 lb

## 2020-08-24 DIAGNOSIS — M47817 Spondylosis without myelopathy or radiculopathy, lumbosacral region: Secondary | ICD-10-CM

## 2020-08-24 MED ORDER — DOXEPIN HCL 100 MG PO CAPS: 100.0000 mg | ORAL_CAPSULE | Freq: Every day | ORAL | 1 refills | Status: AC

## 2020-08-24 MED ORDER — MELOXICAM 7.5 MG PO TABS: 7.5000 mg | ORAL_TABLET | Freq: Every day | ORAL | 0 refills | Status: DC

## 2020-08-24 NOTE — Telephone Encounter (Signed)
Do you want to refill? 

## 2020-08-24 NOTE — Telephone Encounter (Signed)
Patient called need med refill  doxepin (SINEQUAN) 100 MG capsule 90 day supply  Pharmacy:  [facility]  Patient call back # [phone]

## 2020-08-24 NOTE — Telephone Encounter (Signed)
I refilled it

## 2020-08-24 NOTE — Progress Notes (Signed)
Subjective:    Patient ID: Zoe [name], female    DOB: 01/07/1936, 85 y.o.   MRN: [id number]  HPI  Had >50% relief after L3-4-5 MBB x 2 on 08/02/2020, as well as 06/19/2020  Duration of relief ~2 hours, Lidocaine was injected  Discussed options regarding radiofrequency , spinal cord stim, med management , or "living with it" Discussed T spine and L spine MRIs , the Lower thoracic as well as L4 comp fx.  Low back pain is exacerbated by standing or walking, discussed the reason for this using spine model  Has hx of severe constipation cannot even tolerate tramaol  Pain Inventory Average Pain 7 Pain Right Now 7 My pain is sharp, burning, tingling, and aching  In the last 24 hours, has pain interfered with the following? General activity 7 Relation with others 8 Enjoyment of life 8 What TIME of day is your pain at its worst? evening Sleep (in general) Poor  Pain is worse with: sitting, inactivity, and standing Pain improves with: rest and heat/ice Relief from Meds: 3  Family History  Problem Relation Age of Onset   Healthy Son    Healthy Son    Healthy Daughter    Colon cancer Neg Hx    Social History   Socioeconomic History   Marital status: Married    Spouse name: Not on file   Number of children: Not on file   Years of education: Not on file   Highest education level: Not on file  Occupational History   Not on file  Tobacco Use   Smoking status: Some Days    Packs/day: 0.25    Years: 50.00    Pack years: 12.50    Types: Cigarettes    Last attempt to quit: 11/10/2018    Years since quitting: 1.7   Smokeless tobacco: Never  Vaping Use   Vaping Use: Never used  Substance and Sexual Activity   Alcohol use: Yes    Alcohol/week: 0.0 standard drinks    Comment: rare   Drug use: No   Sexual activity: Not Currently    Birth control/protection: Post-menopausal  Other Topics Concern   Not on file  Social History Narrative   Not on file   Social  Determinants of Health   Financial Resource Strain: Low Risk    Difficulty of Paying Living Expenses: Not hard at all  Food Insecurity: No Food Insecurity   Worried About Charity fundraiser in the Last Year: Never true   [place] in the Last Year: Never true  Transportation Needs: No Transportation Needs   Lack of Transportation (Medical): No   Lack of Transportation (Non-Medical): No  Physical Activity: Inactive   Days of Exercise per Week: 0 days   Minutes of Exercise per Session: 0 min  Stress: No Stress Concern Present   Feeling of Stress : Only a little  Social Connections: Moderately Integrated   Frequency of Communication with Friends and Family: More than three times a week   Frequency of Social Gatherings with Friends and Family: Three times a week   Attends Religious Services: 1 to 4 times per year   Active Member of Clubs or Organizations: No   Attends Archivist Meetings: Never   Marital Status: Married   Past Surgical History:  Procedure Laterality Date   ANAL RECTAL MANOMETRY N/A 11/28/2013   Procedure: ANO RECTAL MANOMETRY;  Surgeon: Leighton Ruff, MD;  [facility];  Service: Endoscopy;  Laterality: N/A;   APPENDECTOMY     BACK SURGERY     X2    CHOLECYSTECTOMY     COLONOSCOPY  03/29/03   COLONOSCOPY N/A 12/09/2013   Procedure: COLONOSCOPY;  Surgeon: Rogene Houston, MD;  [facility];  Service: Endoscopy;  Laterality: N/A;  730   COLONOSCOPY N/A 02/04/2017   Procedure: COLONOSCOPY;  Surgeon: Rogene Houston, MD;  [facility];  Service: Endoscopy;  Laterality: N/A;  930   EYE SURGERY     bilateral cataract removal   IR KYPHO LUMBAR INC FX REDUCE BONE BX UNI/BIL CANNULATION INC/IMAGING  11/24/2019   IR KYPHO THORACIC WITH BONE BIOPSY  08/23/2019   IR KYPHO THORACIC WITH BONE BIOPSY  11/24/2019   IR RADIOLOGIST EVAL & MGMT  08/03/2019   IR RADIOLOGIST EVAL & MGMT  09/29/2019   IR RADIOLOGIST EVAL & MGMT  10/18/2019    IR RADIOLOGIST EVAL & MGMT  04/11/2020   SKIN CANCER EXCISION     RIGHT NECK     TONSILLECTOMY     T+A   TOTAL HIP ARTHROPLASTY Right 09/14/2012   Procedure: RIGHT TOTAL HIP ARTHROPLASTY ANTERIOR APPROACH and RIGHT KNEE STEROID INJECTION;  Surgeon: Mcarthur Rossetti, MD;  Location: [place];  Service: Orthopedics;  Laterality: Right;   TOTAL HIP ARTHROPLASTY Left 11/05/2012   Procedure: LEFT TOTAL HIP ARTHROPLASTY ANTERIOR APPROACH;  Surgeon: Mcarthur Rossetti, MD;  [facility];  Service: Orthopedics;  Laterality: Left;   UMBILICAL HERNIA REPAIR  04/26/03   JENKINS   UPPER GASTROINTESTINAL ENDOSCOPY  08/03/2009   NUR   UPPER GASTROINTESTINAL ENDOSCOPY  03/29/2003   E[facility] ED TCS   Past Surgical History:  Procedure Laterality Date   ANAL RECTAL MANOMETRY N/A 11/28/2013   Procedure: ANO RECTAL MANOMETRY;  Surgeon: Leighton Ruff, MD;  [facility];  Service: Endoscopy;  Laterality: N/A;   APPENDECTOMY     BACK SURGERY     X2    CHOLECYSTECTOMY     COLONOSCOPY  03/29/03   COLONOSCOPY N/A 12/09/2013   Procedure: COLONOSCOPY;  Surgeon: Rogene Houston, MD;  [facility];  Service: Endoscopy;  Laterality: N/A;  730   COLONOSCOPY N/A 02/04/2017   Procedure: COLONOSCOPY;  Surgeon: Rogene Houston, MD;  [facility];  Service: Endoscopy;  Laterality: N/A;  930   EYE SURGERY     bilateral cataract removal   IR KYPHO LUMBAR INC FX REDUCE BONE BX UNI/BIL CANNULATION INC/IMAGING  11/24/2019   IR KYPHO THORACIC WITH BONE BIOPSY  08/23/2019   IR KYPHO THORACIC WITH BONE BIOPSY  11/24/2019   IR RADIOLOGIST EVAL & MGMT  08/03/2019   IR RADIOLOGIST EVAL & MGMT  09/29/2019   IR RADIOLOGIST EVAL & MGMT  10/18/2019   IR RADIOLOGIST EVAL & MGMT  04/11/2020   SKIN CANCER EXCISION     RIGHT NECK     TONSILLECTOMY     T+A   TOTAL HIP ARTHROPLASTY Right 09/14/2012   Procedure: RIGHT TOTAL HIP ARTHROPLASTY ANTERIOR APPROACH and RIGHT KNEE STEROID INJECTION;  Surgeon:  Mcarthur Rossetti, MD;  Location: [place];  Service: Orthopedics;  Laterality: Right;   TOTAL HIP ARTHROPLASTY Left 11/05/2012   Procedure: LEFT TOTAL HIP ARTHROPLASTY ANTERIOR APPROACH;  Surgeon: Mcarthur Rossetti, MD;  [facility];  Service: Orthopedics;  Laterality: Left;   UMBILICAL HERNIA REPAIR  04/26/03   JENKINS   UPPER GASTROINTESTINAL ENDOSCOPY  08/03/2009   NUR  UPPER GASTROINTESTINAL ENDOSCOPY  03/29/2003   EGD ED TCS   Past Medical History:  Diagnosis Date   Abdominal discomfort 11-02-12   suspected ?UTI- PCP MD placed on Cipro   Anxiety    Arthritis    Asthma    Cancer ([place])    skin CA   Chronic constipation    Depression    Dysrhythmia    tachycardia   Environmental allergies    Fibromyalgia    GERD (gastroesophageal reflux disease)    Hemorrhoids    Hypercholesteremia    Hypertension    BP 116/77 (BP Location: Right Arm, Patient Position: Sitting, Cuff Size: Normal)   Pulse 69   Temp 98 F (36.7 C) (Oral)   Ht 5' 7.5" (1.715 m)   Wt 170 lb 6.4 oz (77.3 kg)   SpO2 94%   BMI 26.29 kg/m   Opioid Risk Score:   Fall Risk Score:  `1  Depression screen PHQ 2/9  Depression screen [facility] 2/9 08/02/2020 07/16/2020 07/10/2020 07/04/2020 06/19/2020 05/18/2020 04/03/2020  Decreased Interest 1 0 0 0 0 1 1  Down, Depressed, Hopeless 1 0 1 0 0 1 3  PHQ - 2 Score 2 0 1 0 0 2 4  Altered sleeping - - - - - 3 1  Tired, decreased energy - - - - - 1 1  Change in appetite - - - - - 0 0  Feeling bad or failure about yourself  - - - - - 0 0  Trouble concentrating - - - - - 1 1  Moving slowly or fidgety/restless - - - - - 1 0  Suicidal thoughts - - - - - 0 0  PHQ-9 Score - - - - - 8 7  Difficult doing work/chores - - - - - Somewhat difficult -  Some recent data might be hidden     Review of Systems  Musculoskeletal:  Positive for back pain.       Bilateral shoulder pain Left arm pain Bilateral leg and foot pain  Neurological:  Positive for weakness and  numbness.  All other systems reviewed and are negative.     Objective:   Physical Exam Vitals and nursing note reviewed.  Constitutional:      Appearance: She is obese.  HENT:     Head: Normocephalic and atraumatic.  Eyes:     Extraocular Movements: Extraocular movements intact.     Conjunctiva/sclera: Conjunctivae normal.     Pupils: Pupils are equal, round, and reactive to light.  Musculoskeletal:     Comments: There is tenderness palpation lumbar paraspinal area at the lumbosacral junction.  Pain with extension greater than with flexion lumbar flexion is around 50% lumbar extension less than 25% of normal.  Neurological:     Mental Status: She is alert and oriented to person, place, and time. Mental status is at baseline.     Motor: No weakness, tremor or abnormal muscle tone.     Coordination: Coordination is intact.     Gait: Gait is intact.     Comments: Negative straight leg raising test  Psychiatric:        Mood and Affect: Mood normal.        Behavior: Behavior normal.          Assessment & Plan:  1.  Lumbar spondylosis without myelopathy.  She has had about 70% relief from lumbar medial branch blocks on 2 occasions. We discussed treatment options in terms of her pain.  We  discussed that radiofrequency neurotomy is likely to give her some longer-term pain relief i.e. 6 to 12 months, given her positive results on the medial branch blocks. We discussed other treatment alternatives with the patient and her husband.  She elects to proceed.  We discussed risk and benefits of the procedure.  She would like to have the left-sided procedure first  We will schedule for left L3-L4 medial branch left L5 dorsal ramus radiofrequency neurotomy under fluoroscopic guidance. Over half of the 35 min visit was spent counseling and coordinating care.  This was spent educating patient on her back pathology as well as treatment options

## 2020-09-07 ENCOUNTER — Other Ambulatory Visit: Payer: Self-pay | Admitting: Physical Medicine & Rehabilitation

## 2020-09-13 ENCOUNTER — Ambulatory Visit: Payer: PPO | Admitting: Physical Medicine & Rehabilitation

## 2020-09-14 ENCOUNTER — Other Ambulatory Visit: Payer: Self-pay | Admitting: Nurse Practitioner

## 2020-09-14 ENCOUNTER — Telehealth: Payer: Self-pay

## 2020-09-14 DIAGNOSIS — F419 Anxiety disorder, unspecified: Secondary | ICD-10-CM

## 2020-09-14 DIAGNOSIS — G4700 Insomnia, unspecified: Secondary | ICD-10-CM

## 2020-09-14 MED ORDER — ALPRAZOLAM 0.5 MG PO TABS: 0.5000 mg | ORAL_TABLET | Freq: Two times a day (BID) | ORAL | 2 refills | Status: DC | PRN

## 2020-09-14 NOTE — Telephone Encounter (Signed)
Amy from [facility] called need med refill.  ALPRAZolam Duanne Moron) 0.5 MG tablet   Pharmacy: [facility]

## 2020-09-14 NOTE — Telephone Encounter (Signed)
sent 

## 2020-10-02 ENCOUNTER — Other Ambulatory Visit: Payer: Self-pay

## 2020-10-02 ENCOUNTER — Ambulatory Visit: Payer: PPO | Admitting: Nurse Practitioner

## 2020-10-02 ENCOUNTER — Telehealth: Payer: Self-pay

## 2020-10-02 MED ORDER — ALLOPURINOL 300 MG PO TABS: 300.0000 mg | ORAL_TABLET | Freq: Every day | ORAL | 1 refills | Status: AC

## 2020-10-02 NOTE — Telephone Encounter (Signed)
Rx sent 

## 2020-10-02 NOTE — Telephone Encounter (Signed)
Amy from [facility] called said always gets a denial saying patient unknown to the scriber. Patient needs refill:  allopurinol (ZYLOPRIM) 300 MG tablet [id number]   [facility]

## 2020-10-03 DIAGNOSIS — H40013 Open angle with borderline findings, low risk, bilateral: Secondary | ICD-10-CM | POA: Diagnosis not present

## 2020-10-03 DIAGNOSIS — H04123 Dry eye syndrome of bilateral lacrimal glands: Secondary | ICD-10-CM | POA: Diagnosis not present

## 2020-10-10 ENCOUNTER — Ambulatory Visit: Payer: PPO | Admitting: Nurse Practitioner

## 2020-10-11 ENCOUNTER — Telehealth: Payer: Self-pay

## 2020-10-11 NOTE — Telephone Encounter (Signed)
Amy from [facility] called says every time sends a refill in for this patient for  Zoe [name] it always comes back denied "patient unknown to prescriber". Amy call back # [phone]

## 2020-10-11 NOTE — Telephone Encounter (Signed)
Spoke with pharmacist. Informed her this Rx may have been denied by someone whom may have put prescriber not at this office. Will let us know if this happens again.

## 2020-10-16 ENCOUNTER — Other Ambulatory Visit: Payer: Self-pay | Admitting: Physical Medicine & Rehabilitation

## 2020-11-01 ENCOUNTER — Ambulatory Visit (INDEPENDENT_AMBULATORY_CARE_PROVIDER_SITE_OTHER): Payer: PPO | Admitting: Nurse Practitioner

## 2020-11-01 ENCOUNTER — Encounter: Payer: Self-pay | Admitting: Nurse Practitioner

## 2020-11-01 ENCOUNTER — Other Ambulatory Visit: Payer: Self-pay

## 2020-11-01 VITALS — BP 111/71 | HR 74 | Ht 67.0 in | Wt 169.1 lb

## 2020-11-01 DIAGNOSIS — G4700 Insomnia, unspecified: Secondary | ICD-10-CM | POA: Diagnosis not present

## 2020-11-01 DIAGNOSIS — K130 Diseases of lips: Secondary | ICD-10-CM | POA: Diagnosis not present

## 2020-11-01 DIAGNOSIS — M130 Polyarthritis, unspecified: Secondary | ICD-10-CM | POA: Diagnosis not present

## 2020-11-01 MED ORDER — QUETIAPINE FUMARATE 50 MG PO TABS: 50.0000 mg | ORAL_TABLET | Freq: Every day | ORAL | 1 refills | Status: DC

## 2020-11-01 MED ORDER — CELECOXIB 200 MG PO CAPS: 200.0000 mg | ORAL_CAPSULE | Freq: Two times a day (BID) | ORAL | 1 refills | Status: DC

## 2020-11-01 NOTE — Progress Notes (Signed)
Acute Office Visit  Subjective:    Patient ID: Zoe [name], female    DOB: Aug 23, 1935, 85 y.o.   MRN: [id number]  Chief Complaint  Patient presents with   Follow-up    Still hoarse but recently quit smoking in august also wants to discuss Celebrex trazodone not helping, BB in lip     HPI Patient is in today for multiple issues.  She has been taking meloxicam for arthritis/back pain, but stopped taking that a while ago. She tried celebrex from her daughter 200 mg BID, and that has been helping.  For insomnia, trazodone isn't working. She states she only sleeps if she takes both of her xanax.  She is followed by physical medicine for back pain.  She has a pain in her upper lip. She had some masses removed about 5 years ago that were similar. She would like to see a surgeon to get this one removed.  Past Medical History:  Diagnosis Date   Abdominal discomfort 11-02-12   suspected ?UTI- PCP MD placed on Cipro   Anxiety    Arthritis    Asthma    Cancer ([place])    skin CA   Chronic constipation    Depression    Dysrhythmia    tachycardia   Environmental allergies    Fibromyalgia    GERD (gastroesophageal reflux disease)    Hemorrhoids    Hypercholesteremia    Hypertension     Past Surgical History:  Procedure Laterality Date   ANAL RECTAL MANOMETRY N/A 11/28/2013   Procedure: ANO RECTAL MANOMETRY;  Surgeon: Leighton Ruff, MD;  [facility];  Service: Endoscopy;  Laterality: N/A;   APPENDECTOMY     BACK SURGERY     X2    CHOLECYSTECTOMY     COLONOSCOPY  03/29/03   COLONOSCOPY N/A 12/09/2013   Procedure: COLONOSCOPY;  Surgeon: Rogene Houston, MD;  [facility];  Service: Endoscopy;  Laterality: N/A;  730   COLONOSCOPY N/A 02/04/2017   Procedure: COLONOSCOPY;  Surgeon: Rogene Houston, MD;  [facility];  Service: Endoscopy;  Laterality: N/A;  930   EYE SURGERY     bilateral cataract removal   IR KYPHO LUMBAR INC FX REDUCE BONE BX  UNI/BIL CANNULATION INC/IMAGING  11/24/2019   IR KYPHO THORACIC WITH BONE BIOPSY  08/23/2019   IR KYPHO THORACIC WITH BONE BIOPSY  11/24/2019   IR RADIOLOGIST EVAL & MGMT  08/03/2019   IR RADIOLOGIST EVAL & MGMT  09/29/2019   IR RADIOLOGIST EVAL & MGMT  10/18/2019   IR RADIOLOGIST EVAL & MGMT  04/11/2020   SKIN CANCER EXCISION     RIGHT NECK     TONSILLECTOMY     T+A   TOTAL HIP ARTHROPLASTY Right 09/14/2012   Procedure: RIGHT TOTAL HIP ARTHROPLASTY ANTERIOR APPROACH and RIGHT KNEE STEROID INJECTION;  Surgeon: Mcarthur Rossetti, MD;  Location: [facility];  Service: Orthopedics;  Laterality: Right;   TOTAL HIP ARTHROPLASTY Left 11/05/2012   Procedure: LEFT TOTAL HIP ARTHROPLASTY ANTERIOR APPROACH;  Surgeon: Mcarthur Rossetti, MD;  [facility];  Service: Orthopedics;  Laterality: Left;   UMBILICAL HERNIA REPAIR  04/26/03   JENKINS   UPPER GASTROINTESTINAL ENDOSCOPY  08/03/2009   NUR   UPPER GASTROINTESTINAL ENDOSCOPY  03/29/2003   EGD ED TCS    Family History  Problem Relation Age of Onset   Healthy Son    Healthy Son    Healthy Daughter    Colon  cancer Neg Hx     Social History   Socioeconomic History   Marital status: Married    Spouse name: Not on file   Number of children: Not on file   Years of education: Not on file   Highest education level: Not on file  Occupational History   Not on file  Tobacco Use   Smoking status: Former    Packs/day: 0.25    Years: 50.00    Pack years: 12.50    Types: Cigarettes    Quit date: 09/06/2020    Years since quitting: 0.1   Smokeless tobacco: Never  Vaping Use   Vaping Use: Never used  Substance and Sexual Activity   Alcohol use: Yes    Alcohol/week: 0.0 standard drinks    Comment: rare   Drug use: No   Sexual activity: Not Currently    Birth control/protection: Post-menopausal  Other Topics Concern   Not on file  Social History Narrative   Not on file   Social Determinants of Health   Financial Resource Strain:  Low Risk    Difficulty of Paying Living Expenses: Not hard at all  Food Insecurity: No Food Insecurity   Worried About Charity fundraiser in the Last Year: Never true   [place] in the Last Year: Never true  Transportation Needs: No Transportation Needs   Lack of Transportation (Medical): No   Lack of Transportation (Non-Medical): No  Physical Activity: Inactive   Days of Exercise per Week: 0 days   Minutes of Exercise per Session: 0 min  Stress: No Stress Concern Present   Feeling of Stress : Only a little  Social Connections: Moderately Integrated   Frequency of Communication with Friends and Family: More than three times a week   Frequency of Social Gatherings with Friends and Family: Three times a week   Attends Religious Services: 1 to 4 times per year   Active Member of Clubs or Organizations: No   Attends Archivist Meetings: Never   Marital Status: Married  Human resources officer Violence: Not At Risk   Fear of Current or Ex-Partner: No   Emotionally Abused: No   Physically Abused: No   Sexually Abused: No    Outpatient Medications Prior to Visit  Medication Sig Dispense Refill   ADVAIR DISKUS 250-50 MCG/DOSE AEPB Inhale 1 puff into the lungs 2 (two) times daily.     albuterol (PROVENTIL HFA;VENTOLIN HFA) 108 (90 BASE) MCG/ACT inhaler Inhale 2 puffs into the lungs every 6 (six) hours as needed for wheezing or shortness of breath.     alendronate (FOSAMAX) 70 MG tablet Take 1 tablet (70 mg total) by mouth every 7 (seven) days. Take with a full glass of water on an empty stomach. 4 tablet 11   allopurinol (ZYLOPRIM) 300 MG tablet Take 1 tablet (300 mg total) by mouth daily. 90 tablet 1   ALPRAZolam (XANAX) 0.5 MG tablet Take 1 tablet (0.5 mg total) by mouth 2 (two) times daily as needed for anxiety. 60 tablet 2   aspirin 81 MG tablet Take 81 mg by mouth daily.     Biotin 5000 MCG CAPS Take 5,000 mcg by mouth daily.     calcium-vitamin D 250-100 MG-UNIT tablet  Take 1 tablet by mouth 2 (two) times daily. Started 2 days ago     cetirizine (ZYRTEC) 10 MG tablet Take 0.5 tablets (5 mg total) by mouth daily. 45 tablet 1   diclofenac Sodium (VOLTAREN) 1 %  GEL APPLY 2-4 GRAMS TOPICALLY FOUR TIMES DAILY 200 g 3   digoxin (LANOXIN) 0.125 MG tablet Take 125 mcg by mouth daily.     doxepin (SINEQUAN) 100 MG capsule Take 1 capsule (100 mg total) by mouth at bedtime. 90 capsule 1   gabapentin (NEURONTIN) 300 MG capsule Take 1 capsule (300 mg total) by mouth 4 (four) times daily. 360 capsule 1   ipratropium-albuterol (DUONEB) 0.5-2.5 (3) MG/3ML SOLN Inhale 3 mLs into the lungs every 4 (four) hours as needed (SOB).      lubiprostone (AMITIZA) 24 MCG capsule Take 1 capsule (24 mcg total) by mouth 2 (two) times daily with a meal. 60 capsule 1   Multiple Vitamin (MULTIVITAMIN) tablet Take 1 tablet by mouth daily.     omeprazole (PRILOSEC) 20 MG capsule Take 20 mg by mouth daily.     pravastatin (PRAVACHOL) 20 MG tablet Take 20 mg by mouth every other day.     prednisoLONE acetate (PRED FORTE) 1 % ophthalmic suspension SMARTSIG:4 Drop(s) Right Ear Twice Daily PRN     senna (SENOKOT) 8.6 MG tablet Take 5 tablets (43 mg total) by mouth daily. (Patient taking differently: Take 5 tablets by mouth 2 (two) times daily.)     meloxicam (MOBIC) 7.5 MG tablet TAKE ONE TABLET BY MOUTH ONCE DAILY 30 tablet 0   traZODone (DESYREL) 50 MG tablet Take 0.5-1 tablets (25-50 mg total) by mouth at bedtime as needed for sleep. 30 tablet 3   No facility-administered medications prior to visit.    Allergies  Allergen Reactions   Codeine Other (See Comments)    Pains in abdominal area   Escitalopram Itching   Other     NOVACAINE     ITCHING Anti-depressants   Oxycodone     Abdominal pain   Paxil [Paroxetine Hcl] Itching   Procaine Itching    Review of Systems  Constitutional: Negative.   HENT:         Right-sided upper lip pain and palpable mass  Respiratory: Negative.     Cardiovascular: Negative.   Musculoskeletal:  Positive for back pain.  Psychiatric/Behavioral:  Positive for sleep disturbance. Negative for self-injury and suicidal ideas.       Objective:    Physical Exam Constitutional:      Appearance: Normal appearance.  Cardiovascular:     Rate and Rhythm: Normal rate and regular rhythm.     Pulses: Normal pulses.     Heart sounds: Normal heart sounds.  Pulmonary:     Effort: Pulmonary effort is normal.     Breath sounds: Normal breath sounds.  Musculoskeletal:        General: Normal range of motion.  Neurological:     Mental Status: She is alert.  Psychiatric:        Mood and Affect: Mood normal.        Behavior: Behavior normal.        Thought Content: Thought content normal.        Judgment: Judgment normal.    BP 111/71 (BP Location: Right Arm, Patient Position: Sitting, Cuff Size: Large)   Pulse 74   Ht 5' 7"  (1.702 m)   Wt 169 lb 1.9 oz (76.7 kg)   SpO2 95%   BMI 26.49 kg/m  Wt Readings from Last 3 Encounters:  11/01/20 169 lb 1.9 oz (76.7 kg)  08/24/20 170 lb 6.4 oz (77.3 kg)  08/02/20 169 lb (76.7 kg)    Health Maintenance Due  Topic Date Due  INFLUENZA VACCINE  08/27/2020    There are no preventive care reminders to display for this patient.   No results found for: TSH Lab Results  Component Value Date   WBC 7.5 03/29/2020   HGB 12.6 03/29/2020   HCT 37.8 03/29/2020   MCV 96 03/29/2020   PLT 200 03/29/2020   Lab Results  Component Value Date   NA 141 03/29/2020   K 4.5 03/29/2020   CO2 20 03/29/2020   GLUCOSE 112 (H) 03/29/2020   BUN 13 03/29/2020   CREATININE 0.65 03/29/2020   BILITOT 0.3 03/29/2020   ALKPHOS 74 03/29/2020   AST 19 03/29/2020   ALT 16 03/29/2020   PROT 6.6 03/29/2020   ALBUMIN 4.1 03/29/2020   CALCIUM 9.3 03/29/2020   ANIONGAP 12 08/23/2019   EGFR 88 03/29/2020   Lab Results  Component Value Date   CHOL 155 03/29/2020   Lab Results  Component Value Date   HDL 49  03/29/2020   Lab Results  Component Value Date   LDLCALC 80 03/29/2020   Lab Results  Component Value Date   TRIG 150 (H) 03/29/2020   Lab Results  Component Value Date   CHOLHDL 3.2 03/29/2020   Lab Results  Component Value Date   HGBA1C 5.0 03/29/2020       Assessment & Plan:   Problem List Items Addressed This Visit       Digestive   Lip pain    -just above right lip -had masses removed previously -referral to oral surgery      Relevant Orders   Ambulatory referral to Oral Maxillofacial Surgery     Musculoskeletal and Integument   Polyarthritis - Primary    -she stopped taking meloxicam  -Rx. celebrex      Relevant Medications   celecoxib (CELEBREX) 200 MG capsule     Other   Insomnia    -she has been doubling her dose of xanax at night for insomnia -referral to psych -trazodone didn't help -Rx. seroquel      Relevant Medications   QUEtiapine (SEROQUEL) 50 MG tablet     Meds ordered this encounter  Medications   celecoxib (CELEBREX) 200 MG capsule    Sig: Take 1 capsule (200 mg total) by mouth 2 (two) times daily.    Dispense:  180 capsule    Refill:  1   QUEtiapine (SEROQUEL) 50 MG tablet    Sig: Take 1 tablet (50 mg total) by mouth at bedtime.    Dispense:  90 tablet    Refill:  [place], NP

## 2020-11-01 NOTE — Assessment & Plan Note (Signed)
-  just above right lip -had masses removed previously -referral to oral surgery

## 2020-11-01 NOTE — Patient Instructions (Signed)
Please have fasting labs drawn today. 

## 2020-11-01 NOTE — Assessment & Plan Note (Signed)
-  she has been doubling her dose of xanax at night for insomnia -referral to psych -trazodone didn't help -Rx. seroquel

## 2020-11-01 NOTE — Assessment & Plan Note (Signed)
-  she stopped taking meloxicam  -Rx. celebrex

## 2020-11-06 ENCOUNTER — Encounter: Payer: PPO | Admitting: Physical Medicine & Rehabilitation

## 2020-11-12 ENCOUNTER — Telehealth: Payer: Self-pay | Admitting: Nurse Practitioner

## 2020-11-14 ENCOUNTER — Telehealth: Payer: Self-pay

## 2020-11-14 ENCOUNTER — Other Ambulatory Visit: Payer: Self-pay

## 2020-11-14 DIAGNOSIS — M130 Polyarthritis, unspecified: Secondary | ICD-10-CM

## 2020-11-14 NOTE — Telephone Encounter (Signed)
Referral entered to rheumatology

## 2020-11-14 NOTE — Telephone Encounter (Signed)
[facility]   [address] [phone] Dr Gavin Pound

## 2020-11-14 NOTE — Telephone Encounter (Signed)
Patient asking for a referral sent

## 2020-11-19 ENCOUNTER — Other Ambulatory Visit (INDEPENDENT_AMBULATORY_CARE_PROVIDER_SITE_OTHER): Payer: Self-pay | Admitting: Orthopaedic Surgery

## 2020-11-20 ENCOUNTER — Other Ambulatory Visit: Payer: Self-pay | Admitting: *Deleted

## 2020-11-20 DIAGNOSIS — M797 Fibromyalgia: Secondary | ICD-10-CM

## 2020-11-23 ENCOUNTER — Telehealth: Payer: Self-pay | Admitting: Nurse Practitioner

## 2020-11-23 ENCOUNTER — Telehealth: Payer: Self-pay

## 2020-11-23 NOTE — Telephone Encounter (Signed)
Patient called asked if Legrand Como will send a referral over to Dr. Gavin Pound  [facility] their phone # [phone].  Patient request to see this provider.

## 2020-11-23 NOTE — Telephone Encounter (Signed)
Called and business closes at 12 noon onFriday

## 2020-11-23 NOTE — Telephone Encounter (Signed)
Aleena with [facility] called in on pt behalf for  [employer] back # [phone]  Fax # [phone]

## 2020-11-26 ENCOUNTER — Other Ambulatory Visit: Payer: Self-pay | Admitting: *Deleted

## 2020-11-26 DIAGNOSIS — M797 Fibromyalgia: Secondary | ICD-10-CM

## 2020-11-26 DIAGNOSIS — M1990 Unspecified osteoarthritis, unspecified site: Secondary | ICD-10-CM

## 2020-11-26 NOTE — Telephone Encounter (Signed)
Referral has been placed to Gavin Pound, MD per patients request

## 2020-11-26 NOTE — Telephone Encounter (Signed)
Completed.

## 2020-11-27 ENCOUNTER — Telehealth: Payer: Self-pay | Admitting: Nurse Practitioner

## 2020-11-27 NOTE — Telephone Encounter (Signed)
Called and spoke with patient, all questions and concerns were answered

## 2020-11-27 NOTE — Telephone Encounter (Signed)
Pt is returning your call

## 2020-11-29 ENCOUNTER — Ambulatory Visit: Payer: PPO | Admitting: Physical Medicine & Rehabilitation

## 2020-12-14 ENCOUNTER — Other Ambulatory Visit: Payer: Self-pay | Admitting: Family Medicine

## 2020-12-14 ENCOUNTER — Telehealth: Payer: Self-pay | Admitting: Nurse Practitioner

## 2020-12-14 MED ORDER — ALPRAZOLAM 0.5 MG PO TABS: 0.5000 mg | ORAL_TABLET | Freq: Two times a day (BID) | ORAL | 0 refills | Status: DC

## 2020-12-14 NOTE — Telephone Encounter (Signed)
Pt called in for refills on   ALPRAZolam (XANAX) 0.5 MG tablet   Pt states that it was refused at [facility]

## 2020-12-17 ENCOUNTER — Encounter (INDEPENDENT_AMBULATORY_CARE_PROVIDER_SITE_OTHER): Payer: Self-pay

## 2020-12-18 ENCOUNTER — Ambulatory Visit (INDEPENDENT_AMBULATORY_CARE_PROVIDER_SITE_OTHER): Payer: PPO | Admitting: Internal Medicine

## 2020-12-19 NOTE — Telephone Encounter (Signed)
error 

## 2020-12-24 ENCOUNTER — Encounter (INDEPENDENT_AMBULATORY_CARE_PROVIDER_SITE_OTHER): Payer: Self-pay | Admitting: Internal Medicine

## 2020-12-24 ENCOUNTER — Ambulatory Visit (INDEPENDENT_AMBULATORY_CARE_PROVIDER_SITE_OTHER): Payer: PPO | Admitting: Internal Medicine

## 2020-12-24 ENCOUNTER — Other Ambulatory Visit: Payer: Self-pay

## 2020-12-24 VITALS — BP 149/82 | HR 72 | Temp 97.9°F | Ht 67.0 in | Wt 172.1 lb

## 2020-12-24 DIAGNOSIS — R1319 Other dysphagia: Secondary | ICD-10-CM

## 2020-12-24 DIAGNOSIS — K5909 Other constipation: Secondary | ICD-10-CM | POA: Diagnosis not present

## 2020-12-24 DIAGNOSIS — K219 Gastro-esophageal reflux disease without esophagitis: Secondary | ICD-10-CM | POA: Diagnosis not present

## 2020-12-24 NOTE — Patient Instructions (Signed)
Keep decreasing Senokot as tolerated. Esophagogastroduodenoscopy with esophageal dilation scheduled. Remember to stop aspirin 2 days prior to the procedure and Celebrex 1 day prior to procedure.
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
## 2020-12-28 ENCOUNTER — Other Ambulatory Visit (INDEPENDENT_AMBULATORY_CARE_PROVIDER_SITE_OTHER): Payer: Self-pay

## 2020-12-28 DIAGNOSIS — R1319 Other dysphagia: Secondary | ICD-10-CM

## 2020-12-28 DIAGNOSIS — K5909 Other constipation: Secondary | ICD-10-CM

## 2020-12-28 DIAGNOSIS — K219 Gastro-esophageal reflux disease without esophagitis: Secondary | ICD-10-CM

## 2020-12-31 ENCOUNTER — Encounter (INDEPENDENT_AMBULATORY_CARE_PROVIDER_SITE_OTHER): Payer: Self-pay

## 2021-01-03 ENCOUNTER — Ambulatory Visit: Payer: PPO | Admitting: Nurse Practitioner

## 2021-01-07 ENCOUNTER — Other Ambulatory Visit: Payer: Self-pay | Admitting: Nurse Practitioner

## 2021-01-07 DIAGNOSIS — M47816 Spondylosis without myelopathy or radiculopathy, lumbar region: Secondary | ICD-10-CM

## 2021-01-08 ENCOUNTER — Ambulatory Visit (INDEPENDENT_AMBULATORY_CARE_PROVIDER_SITE_OTHER): Payer: PPO | Admitting: Internal Medicine

## 2021-01-10 DIAGNOSIS — F419 Anxiety disorder, unspecified: Secondary | ICD-10-CM | POA: Diagnosis not present

## 2021-01-10 DIAGNOSIS — Z6827 Body mass index (BMI) 27.0-27.9, adult: Secondary | ICD-10-CM | POA: Diagnosis not present

## 2021-01-10 DIAGNOSIS — M549 Dorsalgia, unspecified: Secondary | ICD-10-CM | POA: Diagnosis not present

## 2021-01-10 DIAGNOSIS — Z299 Encounter for prophylactic measures, unspecified: Secondary | ICD-10-CM | POA: Diagnosis not present

## 2021-01-10 DIAGNOSIS — K219 Gastro-esophageal reflux disease without esophagitis: Secondary | ICD-10-CM | POA: Diagnosis not present

## 2021-01-10 DIAGNOSIS — M109 Gout, unspecified: Secondary | ICD-10-CM | POA: Diagnosis not present

## 2021-01-24 DIAGNOSIS — Z299 Encounter for prophylactic measures, unspecified: Secondary | ICD-10-CM | POA: Diagnosis not present

## 2021-01-24 DIAGNOSIS — U071 COVID-19: Secondary | ICD-10-CM | POA: Diagnosis not present

## 2021-01-24 DIAGNOSIS — J449 Chronic obstructive pulmonary disease, unspecified: Secondary | ICD-10-CM | POA: Diagnosis not present

## 2021-01-24 DIAGNOSIS — Z87891 Personal history of nicotine dependence: Secondary | ICD-10-CM | POA: Diagnosis not present

## 2021-01-30 ENCOUNTER — Other Ambulatory Visit: Payer: Self-pay | Admitting: Nurse Practitioner

## 2021-01-30 DIAGNOSIS — G4700 Insomnia, unspecified: Secondary | ICD-10-CM

## 2021-02-11 ENCOUNTER — Encounter (HOSPITAL_COMMUNITY): Payer: PPO

## 2021-02-21 ENCOUNTER — Encounter (INDEPENDENT_AMBULATORY_CARE_PROVIDER_SITE_OTHER): Payer: Self-pay | Admitting: Internal Medicine

## 2021-02-21 ENCOUNTER — Other Ambulatory Visit: Payer: Self-pay

## 2021-02-21 ENCOUNTER — Other Ambulatory Visit (HOSPITAL_COMMUNITY)
Admission: RE | Admit: 2021-02-21 | Discharge: 2021-02-21 | Disposition: A | Discharge status: Home / Self Care | Payer: PPO | Source: Ambulatory Visit | Attending: Internal Medicine | Admitting: Internal Medicine

## 2021-02-21 ENCOUNTER — Ambulatory Visit (HOSPITAL_COMMUNITY)
Admission: RE | Admit: 2021-02-21 | Disposition: A | Discharge status: Home / Self Care | Payer: PPO | Source: Ambulatory Visit | Admitting: Internal Medicine

## 2021-02-21 ENCOUNTER — Other Ambulatory Visit (INDEPENDENT_AMBULATORY_CARE_PROVIDER_SITE_OTHER): Payer: Self-pay

## 2021-02-21 ENCOUNTER — Ambulatory Visit (INDEPENDENT_AMBULATORY_CARE_PROVIDER_SITE_OTHER): Payer: PPO | Admitting: Internal Medicine

## 2021-02-21 VITALS — BP 129/72 | HR 76 | Temp 97.9°F | Ht 67.0 in | Wt 165.0 lb

## 2021-02-21 DIAGNOSIS — I700 Atherosclerosis of aorta: Secondary | ICD-10-CM | POA: Diagnosis not present

## 2021-02-21 DIAGNOSIS — Z01812 Encounter for preprocedural laboratory examination: Secondary | ICD-10-CM

## 2021-02-21 DIAGNOSIS — R109 Unspecified abdominal pain: Secondary | ICD-10-CM | POA: Diagnosis not present

## 2021-02-21 DIAGNOSIS — K5909 Other constipation: Secondary | ICD-10-CM | POA: Diagnosis not present

## 2021-02-21 LAB — CBC WITH DIFFERENTIAL/PLATELET
Abs Immature Granulocytes: 0.02 10*3/uL (ref 0.00–0.07)
Basophils Absolute: 0.1 10*3/uL (ref 0.0–0.1)
Basophils Relative: 1 %
Eosinophils Absolute: 0.3 10*3/uL (ref 0.0–0.5)
Eosinophils Relative: 4 %
HCT: 38.9 % (ref 36.0–46.0)
Hemoglobin: 12.9 g/dL (ref 12.0–15.0)
Immature Granulocytes: 0 %
Lymphocytes Relative: 25 %
Lymphs Abs: 2 10*3/uL (ref 0.7–4.0)
MCH: 33.1 pg (ref 26.0–34.0)
MCHC: 33.2 g/dL (ref 30.0–36.0)
MCV: 99.7 fL (ref 80.0–100.0)
Monocytes Absolute: 0.6 10*3/uL (ref 0.1–1.0)
Monocytes Relative: 7 %
Neutro Abs: 5 10*3/uL (ref 1.7–7.7)
Neutrophils Relative %: 63 %
Platelets: 185 10*3/uL (ref 150–400)
RBC: 3.9 MIL/uL (ref 3.87–5.11)
RDW: 14.2 % (ref 11.5–15.5)
WBC: 8 10*3/uL (ref 4.0–10.5)
nRBC: 0 % (ref 0.0–0.2)

## 2021-02-21 LAB — COMPREHENSIVE METABOLIC PANEL
ALT: 15 U/L (ref 0–44)
AST: 18 U/L (ref 15–41)
Albumin: 4.3 g/dL (ref 3.5–5.0)
Alkaline Phosphatase: 64 U/L (ref 38–126)
Anion gap: 6 (ref 5–15)
BUN: 18 mg/dL (ref 8–23)
CO2: 26 mmol/L (ref 22–32)
Calcium: 9.2 mg/dL (ref 8.9–10.3)
Chloride: 104 mmol/L (ref 98–111)
Creatinine, Ser: 0.93 mg/dL (ref 0.44–1.00)
GFR, Estimated: >60 mL/min (ref >60)
Glucose, Bld: 100 mg/dL — ABNORMAL HIGH (ref 70–99)
Potassium: 4.3 mmol/L (ref 3.5–5.1)
Sodium: 136 mmol/L (ref 135–145)
Total Bilirubin: 0.5 mg/dL (ref 0.3–1.2)
Total Protein: 7.4 g/dL (ref 6.5–8.1)

## 2021-02-21 LAB — POCT I-STAT CREATININE: Creatinine, Ser: 0.9 mg/dL (ref 0.44–1.00)

## 2021-02-21 MED ORDER — IOHEXOL 300 MG/ML  SOLN
100.0000 mL | Freq: Once | INTRAMUSCULAR | Status: AC | PRN
  Administered 2021-02-21: 100 mL via INTRAVENOUS

## 2021-02-21 MED ORDER — IOHEXOL 9 MG/ML PO SOLN
ORAL | Status: AC
  Filled 2021-02-21: qty 1000

## 2021-02-21 NOTE — Progress Notes (Signed)
Presenting complaint;  Abdominal pain of 3 days duration.  Database and subjective:  Patient is 86 year old Caucasian female with history of lifelong constipation refractory therapy and has been dependent on stimulant laxatives in addition to the Amitiza/lubiprostone who requested to be seen today because she has been having abdominal pain which started about 3 days ago.  She has constant steady pain in periumbilical region as well as left lower quadrant.  On top of this study pain she has worsening pain which may last few minutes each time.  She has nausea but no vomiting.  She has been afraid to eat.  She has not noted any change her appetite though.  She has not noted any change in her stool frequency.  She generally has 3-4 bowel movements per week.  She did take Amitiza this morning.  She does not feel the pain any worse with Amitiza.  She has been on this drug for a while.  She has not had fever or chills.  At times pain is throbbing.  She remains with chronic low back pain.  Presently she is not taking Naprosyn.  She also complains of feeling bloated.  She denies melena or rectal bleeding hematuria or vaginal bleeding.  She does report difficulty voiding.  Patient's past history is significant for small bowel obstruction for which she was hospitalized in February 2015 and responded to medical therapy.  She has not had any more spells. Regarding patient's chronic constipation she was referred to colorectal surgeon for 5 years ago with consideration of subtotal colectomy but was not recommended. She has a history of colonic polyps. 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  Outpatient Encounter Medications as of 02/21/2021  Medication Sig   ADVAIR DISKUS 250-50 MCG/DOSE AEPB Inhale 1 puff into the lungs 2 (two) times daily.   albuterol (PROVENTIL HFA;VENTOLIN HFA) 108 (90 BASE) MCG/ACT inhaler Inhale 2 puffs into the lungs every 6 (six) hours as  needed for wheezing or shortness of breath.   allopurinol (ZYLOPRIM) 300 MG tablet Take 1 tablet (300 mg total) by mouth daily.   ALPRAZolam (XANAX) 0.5 MG tablet Take 1 tablet (0.5 mg total) by mouth 2 (two) times daily.   aspirin 81 MG tablet Take 81 mg by mouth daily.   Biotin 5000 MCG CAPS Take 5,000 mcg by mouth daily.   calcium-vitamin D 250-100 MG-UNIT tablet Take 1 tablet by mouth 2 (two) times daily. Started 2 days ago   diclofenac Sodium (VOLTAREN) 1 % GEL APPLY 2-4 GRAMS TOPICALLY FOUR TIMES DAILY   digoxin (LANOXIN) 0.125 MG tablet Take 125 mcg by mouth daily. Takes it twice a week   doxepin (SINEQUAN) 100 MG capsule Take 1 capsule (100 mg total) by mouth at bedtime.   gabapentin (NEURONTIN) 300 MG capsule TAKE ONE CAPSULE BY MOUTH FOUR TIMES DAILY   lubiprostone (AMITIZA) 24 MCG capsule Take 1 capsule (24 mcg total) by mouth 2 (two) times daily with a meal.   Multiple Vitamin (MULTIVITAMIN) tablet Take 1 tablet by mouth daily.   NAPROXEN PO Take by mouth. Bid   omeprazole (PRILOSEC) 20 MG capsule Take 20 mg by mouth daily.   prednisoLONE acetate (PRED FORTE) 1 % ophthalmic suspension    senna (SENOKOT) 8.6 MG tablet Take 5 tablets (43 mg total) by mouth daily. (Patient taking differently: Take 5 tablets by mouth 2 (two) times daily.)   [DISCONTINUED] QUEtiapine (SEROQUEL) 50 MG tablet TAKE ONE TABLET BY MOUTH AT  BEDTIME   celecoxib (CELEBREX) 200 MG capsule Take 1 capsule (200 mg total) by mouth 2 (two) times daily. (Patient not taking: Reported on 02/21/2021)   [DISCONTINUED] cetirizine (ZYRTEC) 10 MG tablet Take 0.5 tablets (5 mg total) by mouth daily.   No facility-administered encounter medications on file as of 02/21/2021.     Objective: Blood pressure 129/72, pulse 76, temperature 97.9 F (36.6 C), temperature source Oral, height 5' 7"  (1.702 m), weight 165 lb (74.8 kg). Patient is alert and appears to be uncomfortable. Conjunctiva is pink. Sclera is  nonicteric Oropharyngeal mucosa is normal. No neck masses or thyromegaly noted. Cardiac exam with regular rhythm normal S1 and S2. No murmur or gallop noted. Lungs are clear to auscultation. Abdomen is symmetrical.  It is not distended.  Bowel sounds are present.  On palpation abdomen is soft.  She has fatty mitral has a mass LLQ tenderness.  There is no guarding or rebound.  No organomegaly or masses. Rectal examination reveals normal sphincter tone.  Rectum is distended.  No evidence of fecal impaction.  A scant amount of stool in vault.  Was tested is quite negative. No LE edema or clubbing noted.  Labs/studies Results:   CBC Latest Ref Rng & Units 03/29/2020 01/10/2020 11/24/2019  WBC 3.4 - 10.8 x10E3/uL 7.5 8.3 6.8  Hemoglobin 11.1 - 15.9 g/dL 12.6 14.0 12.5  Hematocrit 34.0 - 46.6 % 37.8 39.9 37.9  Platelets 150 - 450 x10E3/uL 200 195 172    CMP Latest Ref Rng & Units 03/29/2020 01/10/2020 08/23/2019  Glucose 65 - 99 mg/dL 112(H) 133(H) 121(H)  BUN 8 - 27 mg/dL 13 26 15   Creatinine 0.57 - 1.00 mg/dL 0.65 0.83 0.99  Sodium 134 - 144 mmol/L 141 143 141  Potassium 3.5 - 5.2 mmol/L 4.5 4.1 3.9  Chloride 96 - 106 mmol/L 104 104 106  CO2 20 - 29 mmol/L 20 24 23   Calcium 8.7 - 10.3 mg/dL 9.3 9.4 9.6  Total Protein 6.0 - 8.5 g/dL 6.6 6.7 -  Total Bilirubin 0.0 - 1.2 mg/dL 0.3 0.4 -  Alkaline Phos 44 - 121 IU/L 74 74 -  AST 0 - 40 IU/L 19 18 -  ALT 0 - 32 IU/L 16 17 -    Hepatic Function Latest Ref Rng & Units 03/29/2020 01/10/2020 10/04/2014  Total Protein 6.0 - 8.5 g/dL 6.6 6.7 7.1  Albumin 3.6 - 4.6 g/dL 4.1 4.4 4.1  AST 0 - 40 IU/L 19 18 25   ALT 0 - 32 IU/L 16 17 17   Alk Phosphatase 44 - 121 IU/L 74 74 50  Total Bilirubin 0.0 - 1.2 mg/dL 0.3 0.4 0.9  Bilirubin, Direct 0.0 - 0.3 mg/dL - - -    No recent blood work on [facility].  Assessment:  #1.  Abdominal pain.  Patient presents with 3-day history of abdominal pain associate with nausea without change in her bowel habits.  She has a  history of small bowel obstruction.  Rectal examination does not reveal impaction although she could have proximal impaction.  Diverticulitis is less likely.  #2.  Chronic constipation with dependent on laxatives.  Plan:  Patient advised to stay on full liquids until evaluation completed. CT abdomen and pelvis with contrast this afternoon. CBC and comprehensive chemistry health. Patient advised not to take Amitiza NSAIDs. She can take polyethylene glycol OTC 17 g p.o. twice daily. Probably contact patient with results of CT and blood work further recommendations.

## 2021-02-21 NOTE — Patient Instructions (Addendum)
Do not take Amitiza or arthritis medications. Full liquid diet for now Can take Miralax 17 g by mouth two times a day. Physician will call with results of CT and blood work

## 2021-02-22 ENCOUNTER — Telehealth (INDEPENDENT_AMBULATORY_CARE_PROVIDER_SITE_OTHER): Payer: Self-pay | Admitting: *Deleted

## 2021-02-22 NOTE — Telephone Encounter (Signed)
Per dr Laural Golden - call and see how pt is doing today. If not had BM may take suppository or enema.  I called patient and she states a little better but still no BM and stomach feels full. Having some nausea today. States she would like to try to take her amitiza first and see if she has a response and if none then will try enema.  I let dr Laural Golden know and he said ok to take amitiza.

## 2021-03-04 ENCOUNTER — Ambulatory Visit: Payer: PPO | Admitting: Nurse Practitioner

## 2021-03-07 DIAGNOSIS — J449 Chronic obstructive pulmonary disease, unspecified: Secondary | ICD-10-CM | POA: Diagnosis not present

## 2021-03-07 DIAGNOSIS — I10 Essential (primary) hypertension: Secondary | ICD-10-CM | POA: Diagnosis not present

## 2021-03-07 DIAGNOSIS — Z299 Encounter for prophylactic measures, unspecified: Secondary | ICD-10-CM | POA: Diagnosis not present

## 2021-03-07 DIAGNOSIS — I480 Paroxysmal atrial fibrillation: Secondary | ICD-10-CM | POA: Diagnosis not present

## 2021-03-07 DIAGNOSIS — J069 Acute upper respiratory infection, unspecified: Secondary | ICD-10-CM | POA: Diagnosis not present

## 2021-03-11 ENCOUNTER — Encounter (HOSPITAL_COMMUNITY): Payer: Self-pay

## 2021-03-11 ENCOUNTER — Encounter (HOSPITAL_COMMUNITY)
Admission: RE | Admit: 2021-03-11 | Discharge: 2021-03-11 | Disposition: A | Discharge status: Home / Self Care | Payer: PPO | Source: Ambulatory Visit | Attending: Internal Medicine | Admitting: Internal Medicine

## 2021-03-11 DIAGNOSIS — Z01810 Encounter for preprocedural cardiovascular examination: Secondary | ICD-10-CM | POA: Diagnosis not present

## 2021-03-11 DIAGNOSIS — K5909 Other constipation: Secondary | ICD-10-CM | POA: Diagnosis not present

## 2021-03-11 DIAGNOSIS — R1319 Other dysphagia: Secondary | ICD-10-CM | POA: Diagnosis not present

## 2021-03-11 DIAGNOSIS — K219 Gastro-esophageal reflux disease without esophagitis: Secondary | ICD-10-CM

## 2021-03-11 HISTORY — DX: Sleep apnea, unspecified: G47.30

## 2021-03-11 NOTE — Patient Instructions (Signed)
Zoe [name]  03/11/2021     @PREFPERIOPPHARMACY @   Your procedure is scheduled on  03/13/2021.   Report to [facility] at  [phone] A.M.   Call this number if you have problems the morning of surgery:  [phone]   Remember:  Follow the diet instructions given to you by the office.    Take these medicines the morning of surgery with A SIP OF WATER      Xanax(if needed), allopurinol, digoxin, gabapentin, omeprazole.      Do not wear jewelry, make-up or nail polish.  Do not wear lotions, powders, or perfumes, or deodorant.  Do not shave 48 hours prior to surgery.  Men may shave face and neck.  Do not bring valuables to the hospital.  [facility] is not responsible for any belongings or valuables.  Contacts, dentures or bridgework may not be worn into surgery.  Leave your suitcase in the car.  After surgery it may be brought to your room.  For patients admitted to the hospital, discharge time will be determined by your treatment team.  Patients discharged the day of surgery will not be allowed to drive home and must have someone with them for 24 hours.    Special instructions:   DO NOT smoke tobacco or vape for 24 hors before your procedure.  Please read over the following fact sheets that you were given. Anesthesia Post-op Instructions and Care and Recovery After Surgery      Upper Endoscopy, Adult, Care After This sheet gives you information about how to care for yourself after your procedure. Your health care provider may also give you more specific instructions. If you have problems or questions, contact your health care provider. What can I expect after the procedure? After the procedure, it is common to have: A sore throat. Mild stomach pain or discomfort. Bloating. Nausea. Follow these instructions at home:  Follow instructions from your health care provider about what to eat or drink after your procedure. Return to your normal activities as told  by your health care provider. Ask your health care provider what activities are safe for you. Take over-the-counter and prescription medicines only as told by your health care provider. If you were given a sedative during the procedure, it can affect you for several hours. Do not drive or operate machinery until your health care provider says that it is safe. Keep all follow-up visits as told by your health care provider. This is important. Contact a health care provider if you have: A sore throat that lasts longer than one day. Trouble swallowing. Get help right away if: You vomit blood or your vomit looks like coffee grounds. You have: A fever. Bloody, black, or tarry stools. A severe sore throat or you cannot swallow. Difficulty breathing. Severe pain in your chest or abdomen. Summary After the procedure, it is common to have a sore throat, mild stomach discomfort, bloating, and nausea. If you were given a sedative during the procedure, it can affect you for several hours. Do not drive or operate machinery until your health care provider says that it is safe. Follow instructions from your health care provider about what to eat or drink after your procedure. Return to your normal activities as told by your health care provider. This information is not intended to replace advice given to you by your health care provider. Make sure you discuss any questions you have with your health care provider. Document Revised:  11/19/2018 Document Reviewed: 06/15/2017 Elsevier Patient Education  2022 [place] After This sheet gives you information about how to care for yourself after your procedure. Your health care provider may also give you more specific instructions. If you have problems or questions, contact your health care provider. What can I expect after the procedure? After the procedure, it is common to have: Tiredness. Forgetfulness about what happened  after the procedure. Impaired judgment for important decisions. Nausea or vomiting. Some difficulty with balance. Follow these instructions at home: For the time period you were told by your health care provider:   Rest as needed. Do not participate in activities where you could fall or become injured. Do not drive or use machinery. Do not drink alcohol. Do not take sleeping pills or medicines that cause drowsiness. Do not make important decisions or sign legal documents. Do not take care of children on your own. Eating and drinking Follow the diet that is recommended by your health care provider. Drink enough fluid to keep your urine pale yellow. If you vomit: Drink water, juice, or soup when you can drink without vomiting. Make sure you have little or no nausea before eating solid foods. General instructions Have a responsible adult stay with you for the time you are told. It is important to have someone help care for you until you are awake and alert. Take over-the-counter and prescription medicines only as told by your health care provider. If you have sleep apnea, surgery and certain medicines can increase your risk for breathing problems. Follow instructions from your health care provider about wearing your sleep device: Anytime you are sleeping, including during daytime naps. While taking prescription pain medicines, sleeping medicines, or medicines that make you drowsy. Avoid smoking. Keep all follow-up visits as told by your health care provider. This is important. Contact a health care provider if: You keep feeling nauseous or you keep vomiting. You feel light-headed. You are still sleepy or having trouble with balance after 24 hours. You develop a rash. You have a fever. You have redness or swelling around the IV site. Get help right away if: You have trouble breathing. You have new-onset confusion at home. Summary For several hours after your procedure, you may feel  tired. You may also be forgetful and have poor judgment. Have a responsible adult stay with you for the time you are told. It is important to have someone help care for you until you are awake and alert. Rest as told. Do not drive or operate machinery. Do not drink alcohol or take sleeping pills. Get help right away if you have trouble breathing, or if you suddenly become confused. This information is not intended to replace advice given to you by your health care provider. Make sure you discuss any questions you have with your health care provider. Document Revised: 09/29/2019 Document Reviewed: 12/16/2018 Elsevier Patient Education  2022 [employer].

## 2021-03-13 ENCOUNTER — Encounter (HOSPITAL_COMMUNITY)
Admission: RE | Disposition: A | Discharge status: Home / Self Care | Payer: Self-pay | Source: Ambulatory Visit | Attending: Internal Medicine

## 2021-03-13 ENCOUNTER — Ambulatory Visit (HOSPITAL_COMMUNITY)
Admission: RE | Admit: 2021-03-13 | Discharge: 2021-03-13 | Disposition: A | Discharge status: Home / Self Care | Payer: PPO | Source: Ambulatory Visit | Attending: Internal Medicine | Admitting: Internal Medicine

## 2021-03-13 ENCOUNTER — Ambulatory Visit (HOSPITAL_COMMUNITY): Payer: PPO | Admitting: Certified Registered Nurse Anesthetist

## 2021-03-13 ENCOUNTER — Encounter (HOSPITAL_COMMUNITY): Payer: Self-pay | Admitting: Internal Medicine

## 2021-03-13 ENCOUNTER — Ambulatory Visit (HOSPITAL_BASED_OUTPATIENT_CLINIC_OR_DEPARTMENT_OTHER): Payer: PPO | Admitting: Certified Registered Nurse Anesthetist

## 2021-03-13 ENCOUNTER — Other Ambulatory Visit: Payer: Self-pay

## 2021-03-13 DIAGNOSIS — K2970 Gastritis, unspecified, without bleeding: Secondary | ICD-10-CM | POA: Insufficient documentation

## 2021-03-13 DIAGNOSIS — I110 Hypertensive heart disease with heart failure: Secondary | ICD-10-CM | POA: Insufficient documentation

## 2021-03-13 DIAGNOSIS — K319 Disease of stomach and duodenum, unspecified: Secondary | ICD-10-CM | POA: Insufficient documentation

## 2021-03-13 DIAGNOSIS — K449 Diaphragmatic hernia without obstruction or gangrene: Secondary | ICD-10-CM | POA: Insufficient documentation

## 2021-03-13 DIAGNOSIS — K219 Gastro-esophageal reflux disease without esophagitis: Secondary | ICD-10-CM

## 2021-03-13 DIAGNOSIS — G4730 Sleep apnea, unspecified: Secondary | ICD-10-CM | POA: Diagnosis not present

## 2021-03-13 DIAGNOSIS — J45909 Unspecified asthma, uncomplicated: Secondary | ICD-10-CM | POA: Insufficient documentation

## 2021-03-13 DIAGNOSIS — I509 Heart failure, unspecified: Secondary | ICD-10-CM | POA: Insufficient documentation

## 2021-03-13 DIAGNOSIS — K5909 Other constipation: Secondary | ICD-10-CM

## 2021-03-13 DIAGNOSIS — Z87891 Personal history of nicotine dependence: Secondary | ICD-10-CM | POA: Insufficient documentation

## 2021-03-13 DIAGNOSIS — K2270 Barrett's esophagus without dysplasia: Secondary | ICD-10-CM | POA: Diagnosis not present

## 2021-03-13 DIAGNOSIS — K3189 Other diseases of stomach and duodenum: Secondary | ICD-10-CM | POA: Diagnosis not present

## 2021-03-13 DIAGNOSIS — K2289 Other specified disease of esophagus: Secondary | ICD-10-CM

## 2021-03-13 DIAGNOSIS — R1319 Other dysphagia: Secondary | ICD-10-CM

## 2021-03-13 DIAGNOSIS — R1314 Dysphagia, pharyngoesophageal phase: Secondary | ICD-10-CM

## 2021-03-13 HISTORY — PX: ESOPHAGEAL DILATION: SHX303

## 2021-03-13 HISTORY — PX: BIOPSY: SHX5522

## 2021-03-13 HISTORY — PX: ESOPHAGOGASTRODUODENOSCOPY (EGD) WITH PROPOFOL: SHX5813

## 2021-03-13 SURGERY — ESOPHAGOGASTRODUODENOSCOPY (EGD) WITH PROPOFOL
Anesthesia: General

## 2021-03-13 MED ORDER — ETOMIDATE 2 MG/ML IV SOLN
INTRAVENOUS | Status: AC
  Filled 2021-03-13: qty 10

## 2021-03-13 MED ORDER — LACTATED RINGERS IV SOLN: INTRAVENOUS | Status: DC | PRN

## 2021-03-13 MED ORDER — PROPOFOL 500 MG/50ML IV EMUL
INTRAVENOUS | Status: DC | PRN
  Administered 2021-03-13: 50 ug/kg/min via INTRAVENOUS

## 2021-03-13 MED ORDER — ETOMIDATE 2 MG/ML IV SOLN
INTRAVENOUS | Status: DC | PRN
Start: 2021-03-13 — End: 2021-03-13
  Administered 2021-03-13: 6 mg via INTRAVENOUS

## 2021-03-13 MED ORDER — PROPOFOL 10 MG/ML IV BOLUS
INTRAVENOUS | Status: DC | PRN
  Administered 2021-03-13: 30 mg via INTRAVENOUS

## 2021-03-13 MED ORDER — PROPOFOL 500 MG/50ML IV EMUL
INTRAVENOUS | Status: AC
  Filled 2021-03-13: qty 50

## 2021-03-13 NOTE — Anesthesia Preprocedure Evaluation (Signed)
Anesthesia Evaluation  Patient identified by MRN, date of birth, ID band Patient awake    Reviewed: Allergy & Precautions, H&P , NPO status , Patient's Chart, lab work & pertinent test results, reviewed documented beta blocker date and time   Airway Mallampati: II  TM Distance: >3 FB Neck ROM: full    Dental no notable dental hx.    Pulmonary asthma , sleep apnea , Patient abstained from smoking., former smoker,    Pulmonary exam normal breath sounds clear to auscultation       Cardiovascular Exercise Tolerance: Good hypertension, +CHF  (-) dysrhythmias  Rhythm:regular Rate:Normal     Neuro/Psych PSYCHIATRIC DISORDERS Anxiety Depression  Neuromuscular disease    GI/Hepatic Neg liver ROS, GERD  Medicated,  Endo/Other  negative endocrine ROS  Renal/GU negative Renal ROS  negative genitourinary   Musculoskeletal   Abdominal   Peds  Hematology  (+) Blood dyscrasia, anemia ,   Anesthesia Other Findings   Reproductive/Obstetrics negative OB ROS                             Anesthesia Physical Anesthesia Plan  ASA: 3  Anesthesia Plan: General   Post-op Pain Management:    Induction:   PONV Risk Score and Plan: Propofol infusion  Airway Management Planned:   Additional Equipment:   Intra-op Plan:   Post-operative Plan:   Informed Consent: I have reviewed the patients History and Physical, chart, labs and discussed the procedure including the risks, benefits and alternatives for the proposed anesthesia with the patient or authorized representative who has indicated his/her understanding and acceptance.     Dental Advisory Given  Plan Discussed with: CRNA  Anesthesia Plan Comments:         Anesthesia Quick Evaluation

## 2021-03-13 NOTE — H&P (Signed)
Zoe [name] is an 86 y.o. female.   Chief Complaint: Patient is here for esophagogastroduodenoscopy with esophageal dilation HPI: Patient is 86 year old Caucasian female who has chronic GERD maintained on PPI.  She presents with dysphagia primarily to solids.  She points to upper sternal area as site of bolus obstruction.  She feels she is having dysphagia every day.  She has been able to push food bolus down by drinking few sips of water.  She says she has had 3 episodes of food impaction in the last 6 months or so.  She received Heimlich maneuver by her son for relief.  2 other episodes she was able to get spontaneous relief.  Her appetite is good.  Her weight has been stable. She has chronic back pain and takes celecoxib.  She does not report any side effects. Last aspirin dose was 3 days ago.  Past Medical History:  Diagnosis Date   Abdominal discomfort 11/02/2012   suspected ?UTI- PCP MD placed on Cipro   Anxiety    Arthritis    Asthma    Cancer ([place])    skin CA   Chronic constipation    Depression    Dysrhythmia    tachycardia   Environmental allergies    Fibromyalgia    GERD (gastroesophageal reflux disease)    Hemorrhoids    Hypercholesteremia    Hypertension    Sleep apnea     Past Surgical History:  Procedure Laterality Date   ANAL RECTAL MANOMETRY N/A 11/28/2013   Procedure: ANO RECTAL MANOMETRY;  Surgeon: Leighton Ruff, MD;  Location: [facility];  Service: Endoscopy;  Laterality: N/A;   APPENDECTOMY     BACK SURGERY     X2    CHOLECYSTECTOMY     COLONOSCOPY  03/29/03   COLONOSCOPY N/A 12/09/2013   Procedure: COLONOSCOPY;  Surgeon: Rogene Houston, MD;  Location: [facility];  Service: Endoscopy;  Laterality: N/A;  730   COLONOSCOPY N/A 02/04/2017   Procedure: COLONOSCOPY;  Surgeon: Rogene Houston, MD;  Location: [facility];  Service: Endoscopy;  Laterality: N/A;  930   EYE SURGERY     bilateral cataract removal   IR KYPHO LUMBAR INC FX REDUCE BONE BX  UNI/BIL CANNULATION INC/IMAGING  11/24/2019   IR KYPHO THORACIC WITH BONE BIOPSY  08/23/2019   IR KYPHO THORACIC WITH BONE BIOPSY  11/24/2019   IR RADIOLOGIST EVAL & MGMT  08/03/2019   IR RADIOLOGIST EVAL & MGMT  09/29/2019   IR RADIOLOGIST EVAL & MGMT  10/18/2019   IR RADIOLOGIST EVAL & MGMT  04/11/2020   SKIN CANCER EXCISION     RIGHT NECK     TONSILLECTOMY     T+A   TOTAL HIP ARTHROPLASTY Right 09/14/2012   Procedure: RIGHT TOTAL HIP ARTHROPLASTY ANTERIOR APPROACH and RIGHT KNEE STEROID INJECTION;  Surgeon: Mcarthur Rossetti, MD;  Location: [facility];  Service: Orthopedics;  Laterality: Right;   TOTAL HIP ARTHROPLASTY Left 11/05/2012   Procedure: LEFT TOTAL HIP ARTHROPLASTY ANTERIOR APPROACH;  Surgeon: Mcarthur Rossetti, MD;  Location: [facility];  Service: Orthopedics;  Laterality: Left;   UMBILICAL HERNIA REPAIR  04/26/03   JENKINS   UPPER GASTROINTESTINAL ENDOSCOPY  08/03/2009   NUR   UPPER GASTROINTESTINAL ENDOSCOPY  03/29/2003   EGD ED TCS    Family History  Problem Relation Age of Onset   Healthy Son    Healthy Son    Healthy Daughter    Colon cancer Neg Hx  Social History:  reports that she quit smoking about 6 months ago. Her smoking use included cigarettes. She has a 12.50 pack-year smoking history. She has never used smokeless tobacco. She reports current alcohol use. She reports that she does not use drugs.  Allergies:  Allergies  Allergen Reactions   Codeine Other (See Comments)    Pains in abdominal area   Escitalopram Itching   Other     NOVACAINE     ITCHING Anti-depressants   Oxycodone     Abdominal pain, chest pain   Paxil [Paroxetine Hcl] Itching   Procaine Itching    Medications Prior to Admission  Medication Sig Dispense Refill   ADVAIR DISKUS 250-50 MCG/DOSE AEPB Inhale 1 puff into the lungs daily as needed (asthma).     albuterol (PROVENTIL HFA;VENTOLIN HFA) 108 (90 BASE) MCG/ACT inhaler Inhale 2 puffs into the lungs every 6 (six) hours as needed  for wheezing or shortness of breath.     allopurinol (ZYLOPRIM) 300 MG tablet Take 1 tablet (300 mg total) by mouth daily. 90 tablet 1   ALPRAZolam (XANAX) 0.5 MG tablet Take 1 tablet (0.5 mg total) by mouth 2 (two) times daily. (Patient taking differently: Take 0.5 mg by mouth 2 (two) times daily as needed for anxiety.) 60 tablet 0   aspirin 81 MG tablet Take 81 mg by mouth daily.     Biotin 5000 MCG CAPS Take 10,000 mcg by mouth daily.     calcium-vitamin D 250-100 MG-UNIT tablet Take 1 tablet by mouth 2 (two) times daily.     digoxin (LANOXIN) 0.125 MG tablet Take 125 mcg by mouth daily.     doxepin (SINEQUAN) 100 MG capsule Take 1 capsule (100 mg total) by mouth at bedtime. 90 capsule 1   gabapentin (NEURONTIN) 300 MG capsule TAKE ONE CAPSULE BY MOUTH FOUR TIMES DAILY (Patient taking differently: Take 300 mg by mouth 2 (two) times daily.) 360 capsule 1   lubiprostone (AMITIZA) 24 MCG capsule Take 1 capsule (24 mcg total) by mouth 2 (two) times daily with a meal. 60 capsule 1   Multiple Vitamin (MULTIVITAMIN) tablet Take 1 tablet by mouth daily.     naproxen (NAPROSYN) 500 MG tablet Take 500 mg by mouth 2 (two) times daily with a meal.     omeprazole (PRILOSEC) 20 MG capsule Take 20 mg by mouth daily.     prednisoLONE acetate (PRED FORTE) 1 % ophthalmic suspension 4 drops in affected ear daily as needed     senna (SENOKOT) 8.6 MG tablet Take 5 tablets (43 mg total) by mouth daily. (Patient taking differently: Take 5 tablets by mouth 2 (two) times daily.)     diclofenac Sodium (VOLTAREN) 1 % GEL APPLY 2-4 GRAMS TOPICALLY FOUR TIMES DAILY (Patient taking differently: Apply 1 application topically daily as needed (pain).) 200 g 3    No results found for this or any previous visit (from the past 48 hour(s)). No results found.  Review of Systems  Blood pressure 135/82, pulse 64, temperature 98.3 F (36.8 C), temperature source Oral, resp. rate 18, SpO2 96 %. Physical Exam HENT:      Mouth/Throat:     Mouth: Mucous membranes are moist.     Pharynx: Oropharynx is clear.  Eyes:     Conjunctiva/sclera: Conjunctivae normal.  Cardiovascular:     Rate and Rhythm: Normal rate and regular rhythm.     Heart sounds: Normal heart sounds. No murmur heard. Pulmonary:     Effort: Pulmonary effort  is normal.     Breath sounds: Normal breath sounds.  Abdominal:     General: There is no distension.     Palpations: Abdomen is soft. There is no mass.     Tenderness: There is no abdominal tenderness.  Musculoskeletal:        General: No swelling.     Cervical back: Neck supple.  Lymphadenopathy:     Cervical: No cervical adenopathy.  Skin:    General: Skin is warm and dry.  Neurological:     Mental Status: She is alert.     Assessment/Plan  Esophageal dysphagia in patient with chronic GERD. Esophagogastroduodenoscopy with esophageal dilation under monitored anesthesia care.  Hildred Laser, MD 03/13/2021, 7:35 AM

## 2021-03-13 NOTE — Anesthesia Postprocedure Evaluation (Signed)
Anesthesia Post Note  Patient: Zoe [name]  Procedure(s) Performed: ESOPHAGOGASTRODUODENOSCOPY (EGD) WITH PROPOFOL ESOPHAGEAL DILATION BIOPSY  Patient location during evaluation: Phase II Anesthesia Type: General Level of consciousness: awake Pain management: pain level controlled Vital Signs Assessment: post-procedure vital signs reviewed and stable Respiratory status: spontaneous breathing and respiratory function stable Cardiovascular status: blood pressure returned to baseline and stable Postop Assessment: no headache and no apparent nausea or vomiting Anesthetic complications: no Comments: Late entry   No notable events documented.   Last Vitals:  Vitals:   03/13/21 0708 03/13/21 0909  BP: 135/82 113/62  Pulse: 64 (!) 57  Resp: 18 15  Temp: 36.8 C 36.7 C  SpO2: 96% 100%    Last Pain:  Vitals:   03/13/21 0909  TempSrc: Oral  PainSc: 0-No pain                 Louann Sjogren

## 2021-03-13 NOTE — Op Note (Signed)
[facility] Patient Name: Zoe [name] Procedure Date: 03/13/2021 8:35 AM MRN: [id number] Date of Birth: January 06, 1936 Attending MD: Hildred Laser , MD CSN: [id number] Age: 86 Admit Type: Outpatient Procedure:                Upper GI endoscopy Indications:              Esophageal dysphagia, Gastro-esophageal reflux                            disease Providers:                Hildred Laser, MD, Crystal Page, [place] Risa Grill, Technician Referring MD:             Jerene Bears, MD Medicines:                Propofol per Anesthesia Complications:            No immediate complications. Estimated Blood Loss:     Estimated blood loss was minimal. Procedure:                Pre-Anesthesia Assessment:                           - Prior to the procedure, a History and Physical                            was performed, and patient medications and                            allergies were reviewed. The patient's tolerance of                            previous anesthesia was also reviewed. The risks                            and benefits of the procedure and the sedation                            options and risks were discussed with the patient.                            All questions were answered, and informed consent                            was obtained. Prior Anticoagulants: The patient has                            taken no previous anticoagulant or antiplatelet                            agents except for aspirin and has taken no previous  anticoagulant or antiplatelet agents except for                            NSAID medication. ASA Grade Assessment: II - A                            patient with mild systemic disease. After reviewing                            the risks and benefits, the patient was deemed in                            satisfactory condition to undergo the procedure.                           After  obtaining informed consent, the endoscope was                            passed under direct vision. Throughout the                            procedure, the patient's blood pressure, pulse, and                            oxygen saturations were monitored continuously. The                            [id number]-H190 ([id number]) scope was introduced through the                            mouth, and advanced to the second part of duodenum.                            The upper GI endoscopy was accomplished without                            difficulty. The patient tolerated the procedure                            well. Scope In: 8:47:31 AM Scope Out: 9:04:36 AM Total Procedure Duration: 0 hours 17 minutes 5 seconds  Findings:      The hypopharynx was normal.      The examined esophagus was normal.      The Z-line was irregular with a texture change was found 38 cm from the       incisors. Biopsies were taken with a cold forceps for histology. The       pathology specimen was placed into Bottle Number 3.      A 2 cm hiatal hernia was present.      No endoscopic abnormality was evident in the esophagus to explain the       patient's complaint of dysphagia. It was decided, however, to proceed       with dilation of the entire esophagus. Esophageal dilation performed by       passing 54 [place]  Maloney dilator to full insertion. The dilation site       was examined following endoscope reinsertion and showed no change and no       bleeding, mucosal tear or perforation.      Diffuse mild inflammation characterized by congestion (edema), erythema,       friability and granularity was found in the entire examined stomach.       Biopsies were taken with a cold forceps for histology. The pathology       specimen gastric body was placed into Bottle Number 2. The pathology       specimen from gastric antrum was placed into Bottle Number 1.      The duodenal bulb and second portion of the duodenum were  normal. Impression:               - Normal hypopharynx.                           - Normal esophagus.                           - Z-line irregular, 38 cm from the incisors with                            texture change. Biopsied post dilation.                           - 2 cm hiatal hernia.                           - No endoscopic esophageal abnormality to explain                            patient's dysphagia. Esophagus dilated.                           - Gastritis. Biopsied.                           - Normal duodenal bulb and second portion of the                            duodenum. Moderate Sedation:      Per Anesthesia Care Recommendation:           - Patient has a contact number available for                            emergencies. The signs and symptoms of potential                            delayed complications were discussed with the                            patient. Return to normal activities tomorrow.                            Written discharge instructions were provided to the  patient.                           - Resume previous diet today.                           - Continue present medications.                           - No aspirin, ibuprofen, naproxen, or other                            non-steroidal anti-inflammatory drugs for 1 day.                           - Await pathology results. Procedure Code(s):        --- Professional ---                           [phone], Esophagogastroduodenoscopy, flexible,                            transoral; with biopsy, single or multiple Diagnosis Code(s):        --- Professional ---                           K22.8, Other specified diseases of esophagus                           K44.9, Diaphragmatic hernia without obstruction or                            gangrene                           K29.70, Gastritis, unspecified, without bleeding                           R13.14, Dysphagia,  pharyngoesophageal phase                           K21.9, Gastro-esophageal reflux disease without                            esophagitis CPT copyright 2019 American Medical Association. All rights reserved. The codes documented in this report are preliminary and upon coder review may  be revised to meet current compliance requirements. Hildred Laser, MD Hildred Laser, MD 03/13/2021 9:15:15 AM This report has been signed electronically. Number of Addenda: 0

## 2021-03-13 NOTE — Discharge Instructions (Addendum)
No aspirin or naproxen 24 hours Resume other medications and diet as before. No driving for 24 hours. Physician will call with biopsy results.

## 2021-03-13 NOTE — Transfer of Care (Signed)
Immediate Anesthesia Transfer of Care Note  Patient: Zoe [name]  Procedure(s) Performed: ESOPHAGOGASTRODUODENOSCOPY (EGD) WITH PROPOFOL ESOPHAGEAL DILATION BIOPSY  Patient Location: PACU  Anesthesia Type:General  Level of Consciousness: awake, alert  and oriented  Airway & Oxygen Therapy: Patient Spontanous Breathing  Post-op Assessment: Report given to RN, Post -op Vital signs reviewed and stable, Patient moving all extremities X 4 and Patient able to stick tongue midline  Post vital signs: Reviewed  Last Vitals:  Vitals Value Taken Time  BP 113/62   Temp 98.3   Pulse 54   Resp 16   SpO2 100     Last Pain:  Vitals:   03/13/21 0842  TempSrc:   PainSc: 5          Complications: No notable events documented.

## 2021-03-14 LAB — SURGICAL PATHOLOGY

## 2021-03-15 ENCOUNTER — Encounter (HOSPITAL_COMMUNITY): Payer: Self-pay | Admitting: Internal Medicine

## 2021-03-20 DIAGNOSIS — Z6827 Body mass index (BMI) 27.0-27.9, adult: Secondary | ICD-10-CM | POA: Diagnosis not present

## 2021-03-20 DIAGNOSIS — J069 Acute upper respiratory infection, unspecified: Secondary | ICD-10-CM | POA: Diagnosis not present

## 2021-03-20 DIAGNOSIS — Z299 Encounter for prophylactic measures, unspecified: Secondary | ICD-10-CM | POA: Diagnosis not present

## 2021-03-20 DIAGNOSIS — I10 Essential (primary) hypertension: Secondary | ICD-10-CM | POA: Diagnosis not present

## 2021-04-03 DIAGNOSIS — Z0000 Encounter for general adult medical examination without abnormal findings: Secondary | ICD-10-CM | POA: Diagnosis not present

## 2021-04-03 DIAGNOSIS — Z299 Encounter for prophylactic measures, unspecified: Secondary | ICD-10-CM | POA: Diagnosis not present

## 2021-04-03 DIAGNOSIS — Z6827 Body mass index (BMI) 27.0-27.9, adult: Secondary | ICD-10-CM | POA: Diagnosis not present

## 2021-04-03 DIAGNOSIS — F1320 Sedative, hypnotic or anxiolytic dependence, uncomplicated: Secondary | ICD-10-CM | POA: Diagnosis not present

## 2021-04-03 DIAGNOSIS — Z7189 Other specified counseling: Secondary | ICD-10-CM | POA: Diagnosis not present

## 2021-04-03 DIAGNOSIS — I10 Essential (primary) hypertension: Secondary | ICD-10-CM | POA: Diagnosis not present

## 2021-04-03 DIAGNOSIS — E7800 Pure hypercholesterolemia, unspecified: Secondary | ICD-10-CM | POA: Diagnosis not present

## 2021-04-03 DIAGNOSIS — Z1331 Encounter for screening for depression: Secondary | ICD-10-CM | POA: Diagnosis not present

## 2021-04-03 DIAGNOSIS — R5383 Other fatigue: Secondary | ICD-10-CM | POA: Diagnosis not present

## 2021-04-03 DIAGNOSIS — Z1339 Encounter for screening examination for other mental health and behavioral disorders: Secondary | ICD-10-CM | POA: Diagnosis not present

## 2021-04-04 DIAGNOSIS — H353131 Nonexudative age-related macular degeneration, bilateral, early dry stage: Secondary | ICD-10-CM | POA: Diagnosis not present

## 2021-04-04 DIAGNOSIS — H40013 Open angle with borderline findings, low risk, bilateral: Secondary | ICD-10-CM | POA: Diagnosis not present

## 2021-04-04 DIAGNOSIS — H04123 Dry eye syndrome of bilateral lacrimal glands: Secondary | ICD-10-CM | POA: Diagnosis not present

## 2021-04-04 DIAGNOSIS — H524 Presbyopia: Secondary | ICD-10-CM | POA: Diagnosis not present

## 2021-04-19 DIAGNOSIS — E7800 Pure hypercholesterolemia, unspecified: Secondary | ICD-10-CM | POA: Diagnosis not present

## 2021-04-19 DIAGNOSIS — Z79899 Other long term (current) drug therapy: Secondary | ICD-10-CM | POA: Diagnosis not present

## 2021-04-19 DIAGNOSIS — R5383 Other fatigue: Secondary | ICD-10-CM | POA: Diagnosis not present

## 2021-04-19 DIAGNOSIS — Z299 Encounter for prophylactic measures, unspecified: Secondary | ICD-10-CM | POA: Diagnosis not present

## 2021-04-19 DIAGNOSIS — J302 Other seasonal allergic rhinitis: Secondary | ICD-10-CM | POA: Diagnosis not present

## 2021-04-19 DIAGNOSIS — F419 Anxiety disorder, unspecified: Secondary | ICD-10-CM | POA: Diagnosis not present

## 2021-04-19 DIAGNOSIS — Z713 Dietary counseling and surveillance: Secondary | ICD-10-CM | POA: Diagnosis not present

## 2021-04-19 DIAGNOSIS — E559 Vitamin D deficiency, unspecified: Secondary | ICD-10-CM | POA: Diagnosis not present

## 2021-04-23 DIAGNOSIS — M79671 Pain in right foot: Secondary | ICD-10-CM | POA: Diagnosis not present

## 2021-04-23 DIAGNOSIS — M19072 Primary osteoarthritis, left ankle and foot: Secondary | ICD-10-CM | POA: Diagnosis not present

## 2021-04-23 DIAGNOSIS — M79672 Pain in left foot: Secondary | ICD-10-CM | POA: Diagnosis not present

## 2021-04-23 DIAGNOSIS — M19071 Primary osteoarthritis, right ankle and foot: Secondary | ICD-10-CM | POA: Diagnosis not present

## 2021-04-23 DIAGNOSIS — M76829 Posterior tibial tendinitis, unspecified leg: Secondary | ICD-10-CM | POA: Diagnosis not present

## 2021-04-25 DIAGNOSIS — H40013 Open angle with borderline findings, low risk, bilateral: Secondary | ICD-10-CM | POA: Diagnosis not present

## 2021-05-16 DIAGNOSIS — L570 Actinic keratosis: Secondary | ICD-10-CM | POA: Diagnosis not present

## 2021-05-16 DIAGNOSIS — L812 Freckles: Secondary | ICD-10-CM | POA: Diagnosis not present

## 2021-05-16 DIAGNOSIS — D1801 Hemangioma of skin and subcutaneous tissue: Secondary | ICD-10-CM | POA: Diagnosis not present

## 2021-05-16 DIAGNOSIS — L821 Other seborrheic keratosis: Secondary | ICD-10-CM | POA: Diagnosis not present

## 2021-05-16 DIAGNOSIS — L853 Xerosis cutis: Secondary | ICD-10-CM | POA: Diagnosis not present

## 2021-05-20 DIAGNOSIS — Z299 Encounter for prophylactic measures, unspecified: Secondary | ICD-10-CM | POA: Diagnosis not present

## 2021-05-20 DIAGNOSIS — I10 Essential (primary) hypertension: Secondary | ICD-10-CM | POA: Diagnosis not present

## 2021-05-20 DIAGNOSIS — R000 Tachycardia, unspecified: Secondary | ICD-10-CM | POA: Diagnosis not present

## 2021-05-20 DIAGNOSIS — J302 Other seasonal allergic rhinitis: Secondary | ICD-10-CM | POA: Diagnosis not present

## 2021-05-20 DIAGNOSIS — F419 Anxiety disorder, unspecified: Secondary | ICD-10-CM | POA: Diagnosis not present

## 2021-05-20 DIAGNOSIS — Z87891 Personal history of nicotine dependence: Secondary | ICD-10-CM | POA: Diagnosis not present

## 2021-05-27 DIAGNOSIS — H6123 Impacted cerumen, bilateral: Secondary | ICD-10-CM | POA: Diagnosis not present

## 2021-05-27 DIAGNOSIS — K219 Gastro-esophageal reflux disease without esophagitis: Secondary | ICD-10-CM | POA: Diagnosis not present

## 2021-05-27 DIAGNOSIS — J330 Polyp of nasal cavity: Secondary | ICD-10-CM | POA: Diagnosis not present

## 2021-05-27 DIAGNOSIS — Z87891 Personal history of nicotine dependence: Secondary | ICD-10-CM | POA: Diagnosis not present

## 2021-05-27 DIAGNOSIS — J3489 Other specified disorders of nose and nasal sinuses: Secondary | ICD-10-CM | POA: Diagnosis not present

## 2021-06-10 DIAGNOSIS — M25571 Pain in right ankle and joints of right foot: Secondary | ICD-10-CM | POA: Diagnosis not present

## 2021-06-10 DIAGNOSIS — M25572 Pain in left ankle and joints of left foot: Secondary | ICD-10-CM | POA: Diagnosis not present

## 2021-06-10 DIAGNOSIS — M79671 Pain in right foot: Secondary | ICD-10-CM | POA: Diagnosis not present

## 2021-06-10 DIAGNOSIS — M79672 Pain in left foot: Secondary | ICD-10-CM | POA: Diagnosis not present

## 2021-06-25 ENCOUNTER — Ambulatory Visit (INDEPENDENT_AMBULATORY_CARE_PROVIDER_SITE_OTHER): Payer: PPO | Admitting: Internal Medicine

## 2021-06-25 ENCOUNTER — Encounter (INDEPENDENT_AMBULATORY_CARE_PROVIDER_SITE_OTHER): Payer: Self-pay | Admitting: Internal Medicine

## 2021-06-25 VITALS — BP 111/73 | HR 67 | Temp 98.1°F | Ht 67.0 in | Wt 167.0 lb

## 2021-06-25 DIAGNOSIS — R1319 Other dysphagia: Secondary | ICD-10-CM

## 2021-06-25 DIAGNOSIS — K5909 Other constipation: Secondary | ICD-10-CM | POA: Diagnosis not present

## 2021-06-25 DIAGNOSIS — K2270 Barrett's esophagus without dysplasia: Secondary | ICD-10-CM | POA: Diagnosis not present

## 2021-06-25 NOTE — Patient Instructions (Addendum)
Pleas do not take Amitiza while you are on Senokot. Can use Miralax one or twice daily. Can also try milk of molasses enema if needed; 2 parts milk and one part molasses. Please let us know when you are able to go to [facility] for esophageal manometry

## 2021-06-25 NOTE — Progress Notes (Signed)
Presenting complaint;  Follow-up for chronic GERD, dysphagia and constipation.  Database and subjective:  Patient is 86 year old Caucasian female who is here for scheduled visit. She was last seen on 02/21/2021 for 3 days history of abdominal pain.  CBC and comprehensive chemistry panel were unremarkable.  Abdominal pelvic CT did not reveal evidence of small bowel obstruction which she has history of.  She has not had recurrence of this pain. He has over 50-year history of constipation with dependence on high dose OTC laxatives as well as history of GERD.  She underwent EGD on 03/13/2021 which did not reveal esophageal stricture or Schatzki's ring.  She did have short segment Barrett's esophagus.  She had gastritis but biopsies were negative for H. pylori.  Esophagus was dilated by passing Maloney dilator with provided no relief.  She was scheduled to go to [facility] for esophageal manometry but she had to cancel that appointment.  Patient says heartburn is well controlled with therapy.  She remains with dysphagia which she has every week but not daily.  She has most difficulty with bread and meat.  She also has difficulty with bagel.  She has not had any episode of food impaction.  She states she just waits and food bolus eventually goes down.  She also feels taking few sips of water helps.  She continues to have periods where she does not have a bowel movement for as many as 7 days.  She says she also has moments where she can have a bowel movement daily for few days.  She notices hematochezia only when she is constipated.  She has a history of colonic adenomas but none were found on her colonoscopy of January 2019.  This study revealed melanosis coli and external hemorrhoids. Patient is still taking Amitiza in addition to 8 to 10 tablets of Senokot daily.  Patient had been advised not to take Amitiza while she is on Senokot. Patient reports coughing for 1 week.  She has noted chest tightness.  She decided to  treat her cell with prednisone.  Patient advised to follow-up with PCP.    Current Medications: Outpatient Encounter Medications as of 06/25/2021  Medication Sig   ADVAIR DISKUS 250-50 MCG/DOSE AEPB Inhale 1 puff into the lungs daily as needed (asthma).   albuterol (PROVENTIL HFA;VENTOLIN HFA) 108 (90 BASE) MCG/ACT inhaler Inhale 2 puffs into the lungs every 6 (six) hours as needed for wheezing or shortness of breath.   allopurinol (ZYLOPRIM) 300 MG tablet Take 1 tablet (300 mg total) by mouth daily.   ALPRAZolam (XANAX) 0.5 MG tablet Take 1 tablet (0.5 mg total) by mouth 2 (two) times daily. (Patient taking differently: Take 0.5 mg by mouth 2 (two) times daily as needed for anxiety.)   aspirin 81 MG tablet Take 1 tablet (81 mg total) by mouth daily.   Biotin 10000 MCG TABS Take 10,000 mcg by mouth daily.   calcium-vitamin D 250-100 MG-UNIT tablet Take 1 tablet by mouth 2 (two) times daily.   diclofenac Sodium (VOLTAREN) 1 % GEL APPLY 2-4 GRAMS TOPICALLY FOUR TIMES DAILY (Patient taking differently: Apply 1 application. topically daily as needed (pain).)   digoxin (LANOXIN) 0.125 MG tablet Take 125 mcg by mouth daily.   doxepin (SINEQUAN) 100 MG capsule Take 1 capsule (100 mg total) by mouth at bedtime.   gabapentin (NEURONTIN) 300 MG capsule TAKE ONE CAPSULE BY MOUTH FOUR TIMES DAILY (Patient taking differently: Take 300 mg by mouth 2 (two) times daily.)   levocetirizine (XYZAL)  5 MG tablet SMARTSIG:1 Tablet(s) By Mouth Every Evening   lubiprostone (AMITIZA) 24 MCG capsule Take 1 capsule (24 mcg total) by mouth 2 (two) times daily with a meal.   meloxicam (MOBIC) 15 MG tablet meloxicam 15 mg tablet  TAKE ONE TABLET BY MOUTH ONCE DAILY   Multiple Vitamin (MULTIVITAMIN) tablet Take 1 tablet by mouth daily.   omeprazole (PRILOSEC) 20 MG capsule Take 20 mg by mouth daily.   senna (SENOKOT) 8.6 MG tablet Take 5 tablets (43 mg total) by mouth daily. (Patient taking differently: Take 5 tablets by  mouth 2 (two) times daily.)   prednisoLONE acetate (PRED FORTE) 1 % ophthalmic suspension 4 drops in affected ear daily as needed (Patient not taking: Reported on 06/25/2021)   [DISCONTINUED] naproxen (NAPROSYN) 500 MG tablet Take 1 tablet (500 mg total) by mouth 2 (two) times daily with a meal. (Patient not taking: Reported on 06/25/2021)   No facility-administered encounter medications on file as of 06/25/2021.     Objective: Blood pressure 111/73, pulse 67, temperature 98.1 F (36.7 C), temperature source Oral, height 5' 7"  (1.702 m), weight 167 lb (75.8 kg). Patient is alert.  She is wearing a mask. Conjunctiva is pink. Sclera is nonicteric Oropharyngeal mucosa is normal. No neck masses or thyromegaly noted. Cardiac exam with regular rhythm normal S1 and S2. No murmur or gallop noted. Lungs are clear to auscultation. Abdomen is symmetrical soft and nontender with organomegaly or masses. No LE edema or clubbing noted.  Labs/studies Results:      Latest Ref Rng & Units 02/21/2021    5:09 PM 03/29/2020   11:46 AM 01/10/2020   11:41 AM  CBC  WBC 4.0 - 10.5 K/uL 8.0   7.5   8.3    Hemoglobin 12.0 - 15.0 g/dL 12.9   12.6   14.0    Hematocrit 36.0 - 46.0 % 38.9   37.8   39.9    Platelets 150 - 400 K/uL 185   200   195         Latest Ref Rng & Units 02/21/2021    5:48 PM 02/21/2021    5:09 PM 03/29/2020   11:46 AM  CMP  Glucose 70 - 99 mg/dL  100   112    BUN 8 - 23 mg/dL  18   13    Creatinine 0.44 - 1.00 mg/dL 0.90   0.93   0.65    Sodium 135 - 145 mmol/L  136   141    Potassium 3.5 - 5.1 mmol/L  4.3   4.5    Chloride 98 - 111 mmol/L  104   104    CO2 22 - 32 mmol/L  26   20    Calcium 8.9 - 10.3 mg/dL  9.2   9.3    Total Protein 6.5 - 8.1 g/dL  7.4   6.6    Total Bilirubin 0.3 - 1.2 mg/dL  0.5   0.3    Alkaline Phos 38 - 126 U/L  64   74    AST 15 - 41 U/L  18   19    ALT 0 - 44 U/L  15   16         Latest Ref Rng & Units 02/21/2021    5:09 PM 03/29/2020   11:46 AM  01/10/2020   11:41 AM  Hepatic Function  Total Protein 6.5 - 8.1 g/dL 7.4   6.6   6.7    Albumin  3.5 - 5.0 g/dL 4.3   4.1   4.4    AST 15 - 41 U/L 18   19   18     ALT 0 - 44 U/L 15   16   17     Alk Phosphatase 38 - 126 U/L 64   74   74    Total Bilirubin 0.3 - 1.2 mg/dL 0.5   0.3   0.4        Assessment:  #1.  Chronic GERD complicated by short segment Barrett's esophagus.  GERD symptoms are well controlled with therapy.   #2.  Esophageal dysphagia.  EGD over 3 months ago revealed no structural abnormality and she did not respond to [facility] dilation.  Therefore she is suspected to have esophageal motility disorder.  It remains to be seen if she has an abnormality that is amenable to medical therapy.    #3.  Chronic constipation.  She has been dependent on high-dose OTC stimulant laxatives for decades.  Prior attempts at weaning her off have not been successful.  I would not recommend that she takes Amitiza while she is on Senokot.  NSAIDs she can take polyethylene glycol and use milk of molasses enemas as free tenesmus do not provide relief anymore.  Plan:  Patient to follow-up with PCP regarding bronchitis. Patient will contact GI lab at [facility] to schedule high-resolution esophageal manometry.  She states she has their number. Patient advised not to take Amitiza.  This medication was deleted from her list. Take polyethylene glycol 17 g once or twice daily as needed in addition to OTC laxative. Can try milk of molasses enemas on as-needed basis or if she goes more than 2 days without a bowel movement.  Patient instructed on composition.  2 parts milk and 1 part of molasses. Office visit with Dr. Jenetta Downer in 4 months.

## 2021-07-11 ENCOUNTER — Encounter: Payer: PPO | Admitting: *Deleted

## 2021-07-11 NOTE — Progress Notes (Signed)
This encounter was created in error - please disregard.

## 2021-08-06 DIAGNOSIS — Z299 Encounter for prophylactic measures, unspecified: Secondary | ICD-10-CM | POA: Diagnosis not present

## 2021-08-06 DIAGNOSIS — M2550 Pain in unspecified joint: Secondary | ICD-10-CM | POA: Diagnosis not present

## 2021-08-06 DIAGNOSIS — I10 Essential (primary) hypertension: Secondary | ICD-10-CM | POA: Diagnosis not present

## 2021-09-25 DIAGNOSIS — J449 Chronic obstructive pulmonary disease, unspecified: Secondary | ICD-10-CM | POA: Diagnosis not present

## 2021-09-25 DIAGNOSIS — Z6826 Body mass index (BMI) 26.0-26.9, adult: Secondary | ICD-10-CM | POA: Diagnosis not present

## 2021-09-25 DIAGNOSIS — I480 Paroxysmal atrial fibrillation: Secondary | ICD-10-CM | POA: Diagnosis not present

## 2021-09-25 DIAGNOSIS — K5909 Other constipation: Secondary | ICD-10-CM | POA: Diagnosis not present

## 2021-09-25 DIAGNOSIS — I10 Essential (primary) hypertension: Secondary | ICD-10-CM | POA: Diagnosis not present

## 2021-09-25 DIAGNOSIS — Z299 Encounter for prophylactic measures, unspecified: Secondary | ICD-10-CM | POA: Diagnosis not present

## 2021-10-29 DIAGNOSIS — J342 Deviated nasal septum: Secondary | ICD-10-CM | POA: Diagnosis not present

## 2021-10-29 DIAGNOSIS — J343 Hypertrophy of nasal turbinates: Secondary | ICD-10-CM | POA: Diagnosis not present

## 2021-10-29 DIAGNOSIS — H608X3 Other otitis externa, bilateral: Secondary | ICD-10-CM | POA: Diagnosis not present

## 2021-10-29 DIAGNOSIS — H6981 Other specified disorders of Eustachian tube, right ear: Secondary | ICD-10-CM | POA: Diagnosis not present

## 2021-10-29 DIAGNOSIS — H6121 Impacted cerumen, right ear: Secondary | ICD-10-CM | POA: Diagnosis not present

## 2021-10-29 DIAGNOSIS — J338 Other polyp of sinus: Secondary | ICD-10-CM | POA: Diagnosis not present

## 2021-10-29 DIAGNOSIS — J310 Chronic rhinitis: Secondary | ICD-10-CM | POA: Diagnosis not present

## 2021-11-21 DIAGNOSIS — J342 Deviated nasal septum: Secondary | ICD-10-CM | POA: Diagnosis not present

## 2021-11-21 DIAGNOSIS — J310 Chronic rhinitis: Secondary | ICD-10-CM | POA: Diagnosis not present

## 2021-11-21 DIAGNOSIS — J343 Hypertrophy of nasal turbinates: Secondary | ICD-10-CM | POA: Diagnosis not present

## 2021-11-22 DIAGNOSIS — H524 Presbyopia: Secondary | ICD-10-CM | POA: Diagnosis not present

## 2021-11-22 DIAGNOSIS — H40013 Open angle with borderline findings, low risk, bilateral: Secondary | ICD-10-CM | POA: Diagnosis not present

## 2021-11-28 ENCOUNTER — Encounter (INDEPENDENT_AMBULATORY_CARE_PROVIDER_SITE_OTHER): Payer: Self-pay | Admitting: Gastroenterology

## 2021-11-28 ENCOUNTER — Ambulatory Visit (INDEPENDENT_AMBULATORY_CARE_PROVIDER_SITE_OTHER): Payer: PPO | Admitting: Gastroenterology

## 2021-11-28 ENCOUNTER — Other Ambulatory Visit (INDEPENDENT_AMBULATORY_CARE_PROVIDER_SITE_OTHER): Payer: Self-pay

## 2021-11-28 VITALS — BP 118/64 | HR 72 | Temp 97.3°F | Ht 67.0 in | Wt 168.8 lb

## 2021-11-28 DIAGNOSIS — K581 Irritable bowel syndrome with constipation: Secondary | ICD-10-CM | POA: Diagnosis not present

## 2021-11-28 DIAGNOSIS — R1319 Other dysphagia: Secondary | ICD-10-CM

## 2021-11-28 DIAGNOSIS — T17908A Unspecified foreign body in respiratory tract, part unspecified causing other injury, initial encounter: Secondary | ICD-10-CM | POA: Insufficient documentation

## 2021-11-28 DIAGNOSIS — T17908D Unspecified foreign body in respiratory tract, part unspecified causing other injury, subsequent encounter: Secondary | ICD-10-CM

## 2021-11-28 DIAGNOSIS — K589 Irritable bowel syndrome without diarrhea: Secondary | ICD-10-CM | POA: Insufficient documentation

## 2021-11-28 DIAGNOSIS — K219 Gastro-esophageal reflux disease without esophagitis: Secondary | ICD-10-CM | POA: Diagnosis not present

## 2021-11-28 DIAGNOSIS — K2270 Barrett's esophagus without dysplasia: Secondary | ICD-10-CM

## 2021-11-28 MED ORDER — LUBIPROSTONE 24 MCG PO CAPS: 24.0000 ug | ORAL_CAPSULE | Freq: Two times a day (BID) | ORAL | 3 refills | Status: DC

## 2021-11-28 NOTE — Progress Notes (Signed)
Zoe [name], M.D. Gastroenterology & Hepatology [facility] [address] [place], [place] [address]  Primary Care Physician: Zoe Chroman, MD [place] [address]  I will communicate my assessment and recommendations to the referring MD via EMR.  Problems: GERD Short segment Barrett's esophagus IBS-C  History of Present Illness: Zoe [name] is a 86 y.o. female with PMH GERD c/d BE, IBS-C, who presents for follow up of IBS-C, throat discomfort and GERD.  The patient was last seen on 06/25/2021 (Dr. Laural [name]). At that time, the patient was referred for esophageal manometry as she persisted with dysphagia despite undergoing empiric dilation of her esophagus.  She was started on MiraLAX 1-2 times a day.  Patient reports that she has presented a chronic history of feeling there is some discomfort in her throat. She still has to clear her throat occasionally, but it does not bother her that much now. States she may have the discomfort in the throat even if she is not swallowing, but endorses feeling possibly some dysphagia with solids. She reports that when she swallows bread or milk she may feel food gets stuck in her chest. She has to vomit the contents or drink a fair amount of water to improve the symptoms.  Notably, she states that she has been scared with choking as she has had three episodes when she has sleeping which she describes as she "choked with her saliva". She felt she could not breath and coughed several times.  Most recent esophagram on 06/2020 showed age related dysmotility, and some mild laryngeal aspiration.  The patient denies having any nausea, vomiting, fever, chills, hematochezia, melena, hematemesis, abdominal distention, abdominal pain, diarrhea, jaundice, pruritus or weight loss.  Patient is currently taking omeprazole 20 mg every day. No heartburn or odynophagia.  She reports that she has presented  chronic constipation. Has been on multiple medications. Has a BM every 2-4 days. She currently takes 5 Sennakot in AM and PM. She was doing good with Amitiza but this was stopped for unclear reasons.  Last EGD: 03/13/2021 - Normal hypopharynx. - Normal esophagus. - Z-line irregular, 38 cm from the incisors with texture change. Biopsied post dilation. - 2 cm hiatal hernia. - No endoscopic esophageal abnormality to explain patient's dysphagia. Esophagus dilated. - Gastritis. Biopsied. - Normal duodenal bulb and second portion of the duodenum.  Path: A. ANTRUM, BIOPSY:  - Gastric antral mucosa with mild nonspecific reactive gastropathy  - Helicobacter pylori-like organisms are not identified on routine HE  stain   B. STOMACH, BODY, BIOPSY:  - Gastric oxyntic mucosa with parietal cell hyperplasia as can be seen  in hypergastrinemic states such as PPI therapy.  - Helicobacter pylori-like organisms are not identified on routine HE  stain   C. GE JUNCTION, BIOPSY:  - Esophageal squamous and cardiac mucosa with focal intestinal  metaplasia. See note  - Negative for dysplasia   Last Colonoscopy: 2019 - Preparation of the colon was fair. - Melanosis in the colon. - External hemorrhoids.  Past Medical History: Past Medical History:  Diagnosis Date   Abdominal discomfort 11/02/2012   suspected ?UTI- PCP MD placed on Cipro   Anxiety    Arthritis    Asthma    Cancer ([place])    skin CA   Chronic constipation    Depression    Dysrhythmia    tachycardia   Environmental allergies    Fibromyalgia    GERD (gastroesophageal reflux disease)  Hemorrhoids    Hypercholesteremia    Hypertension    Sleep apnea     Past Surgical History: Past Surgical History:  Procedure Laterality Date   ANAL RECTAL MANOMETRY N/A 11/28/2013   Procedure: ANO RECTAL MANOMETRY;  Surgeon: Zoe Ruff, MD;  Location: [facility];  Service: Endoscopy;  Laterality: N/A;   APPENDECTOMY     BACK SURGERY      X2    BIOPSY  03/13/2021   Procedure: BIOPSY;  Surgeon: Zoe Houston, MD;  Location: [facility];  Service: Endoscopy;;   CHOLECYSTECTOMY     COLONOSCOPY  03/29/03   COLONOSCOPY N/A 12/09/2013   Procedure: COLONOSCOPY;  Surgeon: Zoe Houston, MD;  [facility]: [facility];  Service: Endoscopy;  Laterality: N/A;  730   COLONOSCOPY N/A 02/04/2017   Procedure: COLONOSCOPY;  Surgeon: Zoe Houston, MD;  [facility]: [facility];  Service: Endoscopy;  Laterality: N/A;  930   ESOPHAGEAL DILATION N/A 03/13/2021   Procedure: ESOPHAGEAL DILATION;  Surgeon: Zoe Houston, MD;  [facility];  Service: Endoscopy;  Laterality: N/A;   ESOPHAGOGASTRODUODENOSCOPY (EGD) WITH PROPOFOL N/A 03/13/2021   Procedure: ESOPHAGOGASTRODUODENOSCOPY (EGD) WITH PROPOFOL;  Surgeon: Zoe Houston, MD;  [facility]: [facility];  Service: Endoscopy;  Laterality: N/A;  8:05   EYE SURGERY     bilateral cataract removal   IR KYPHO LUMBAR INC FX REDUCE BONE BX UNI/BIL CANNULATION INC/IMAGING  11/24/2019   IR KYPHO THORACIC WITH BONE BIOPSY  08/23/2019   IR KYPHO THORACIC WITH BONE BIOPSY  11/24/2019   IR RADIOLOGIST EVAL & MGMT  08/03/2019   IR RADIOLOGIST EVAL & MGMT  09/29/2019   IR RADIOLOGIST EVAL & MGMT  10/18/2019   IR RADIOLOGIST EVAL & MGMT  04/11/2020   SKIN CANCER EXCISION     RIGHT NECK     TONSILLECTOMY     T+A   TOTAL HIP ARTHROPLASTY Right 09/14/2012   Procedure: RIGHT TOTAL HIP ARTHROPLASTY ANTERIOR APPROACH and RIGHT KNEE STEROID INJECTION;  Surgeon: Zoe Rossetti, MD;  Location: [facility];  Service: Orthopedics;  Laterality: Right;   TOTAL HIP ARTHROPLASTY Left 11/05/2012   Procedure: LEFT TOTAL HIP ARTHROPLASTY ANTERIOR APPROACH;  Surgeon: Zoe Rossetti, MD;  Location: [facility];  Service: Orthopedics;  Laterality: Left;   UMBILICAL HERNIA REPAIR  04/26/03   JENKINS   UPPER GASTROINTESTINAL ENDOSCOPY  08/03/2009   NUR   UPPER GASTROINTESTINAL ENDOSCOPY  03/29/2003   EGD ED  TCS    Family History: Family History  Problem Relation Age of Onset   Healthy Son    Healthy Son    Healthy Daughter    Colon cancer Neg Hx     Social History: Social History   Tobacco Use  Smoking Status Former   Packs/day: 0.25   Years: 50.00   Total pack years: 12.50   Types: Cigarettes   Quit date: 09/06/2020   Years since quitting: 1.2  Smokeless Tobacco Never   Social History   Substance and Sexual Activity  Alcohol Use Yes   Alcohol/week: 0.0 standard drinks of alcohol   Comment: rare   Social History   Substance and Sexual Activity  Drug Use No    Allergies: Allergies  Allergen Reactions   Codeine Other (See Comments)    Pains in abdominal area   Escitalopram Itching   Other     NOVACAINE     ITCHING Anti-depressants   Oxycodone     Abdominal pain, chest  pain   Paxil [Paroxetine Hcl] Itching   Procaine Itching    Medications: Current Outpatient Medications  Medication Sig Dispense Refill   ADVAIR DISKUS 250-50 MCG/DOSE AEPB Inhale 1 puff into the lungs daily as needed (asthma).     albuterol (PROVENTIL HFA;VENTOLIN HFA) 108 (90 BASE) MCG/ACT inhaler Inhale 2 puffs into the lungs every 6 (six) hours as needed for wheezing or shortness of breath.     allopurinol (ZYLOPRIM) 300 MG tablet Take 1 tablet (300 mg total) by mouth daily. 90 tablet 1   ALPRAZolam (XANAX) 0.5 MG tablet Take 1 tablet (0.5 mg total) by mouth 2 (two) times daily. (Patient taking differently: Take 0.5 mg by mouth 2 (two) times daily as needed for anxiety.) 60 tablet 0   aspirin 81 MG tablet Take 1 tablet (81 mg total) by mouth daily. 30 tablet    Biotin 10000 MCG TABS Take 10,000 mcg by mouth daily.     calcium-vitamin D 250-100 MG-UNIT tablet Take 1 tablet by mouth 2 (two) times daily.     diclofenac Sodium (VOLTAREN) 1 % GEL APPLY 2-4 GRAMS TOPICALLY FOUR TIMES DAILY (Patient taking differently: Apply 1 application  topically daily as needed (pain).) 200 g 3   digoxin  (LANOXIN) 0.125 MG tablet Take 125 mcg by mouth daily.     doxepin (SINEQUAN) 100 MG capsule Take 1 capsule (100 mg total) by mouth at bedtime. 90 capsule 1   gabapentin (NEURONTIN) 300 MG capsule TAKE ONE CAPSULE BY MOUTH FOUR TIMES DAILY (Patient taking differently: Take 300 mg by mouth 2 (two) times daily.) 360 capsule 1   levocetirizine (XYZAL) 5 MG tablet One half daily     meloxicam (MOBIC) 15 MG tablet meloxicam 15 mg tablet  TAKE ONE TABLET BY MOUTH ONCE DAILY     Multiple Vitamin (MULTIVITAMIN) tablet Take 1 tablet by mouth daily.     omeprazole (PRILOSEC) 20 MG capsule Take 20 mg by mouth daily.     senna (SENOKOT) 8.6 MG tablet Take 5 tablets (43 mg total) by mouth daily. (Patient taking differently: Take 5 tablets by mouth 2 (two) times daily.)     No current facility-administered medications for this visit.    Review of Systems: GENERAL: negative for malaise, night sweats HEENT: No changes in hearing or vision, no nose bleeds or other nasal problems. NECK: Negative for lumps, goiter, pain and significant neck swelling RESPIRATORY: Negative for cough, wheezing CARDIOVASCULAR: Negative for chest pain, leg swelling, palpitations, orthopnea GI: SEE HPI MUSCULOSKELETAL: Negative for joint pain or swelling, back pain, and muscle pain. SKIN: Negative for lesions, rash PSYCH: Negative for sleep disturbance, mood disorder and recent psychosocial stressors. HEMATOLOGY Negative for prolonged bleeding, bruising easily, and swollen nodes. ENDOCRINE: Negative for cold or heat intolerance, polyuria, polydipsia and goiter. NEURO: negative for tremor, gait imbalance, syncope and seizures. The remainder of the review of systems is noncontributory.   Physical Exam: BP 118/64 (BP Location: Right Arm, Patient Position: Sitting, Cuff Size: Large)   Pulse 72   Temp (!) 97.3 F (36.3 C) (Oral)   Ht '5\' 7"'$  (1.702 m)   Wt 168 lb 12.8 oz (76.6 kg)   BMI 26.44 kg/m  GENERAL: The patient is AO  x3, in no acute distress. HEENT: Head is normocephalic and atraumatic. EOMI are intact. Mouth is well hydrated and without lesions. NECK: Supple. No masses LUNGS: Clear to auscultation. No presence of rhonchi/wheezing/rales. Adequate chest expansion HEART: RRR, normal s1 and s2. ABDOMEN: Soft, nontender, no  guarding, no peritoneal signs, and nondistended. BS +. No masses. EXTREMITIES: Without any cyanosis, clubbing, rash, lesions or edema. NEUROLOGIC: AOx3, no focal motor deficit. SKIN: no jaundice, no rashes  Imaging/Labs: as above  I personally reviewed and interpreted the available labs, imaging and endoscopic files.  Impression and Plan: Zoe [name] is a 86 y.o. female with PMH GERD c/d BE, IBS-C, who presents for follow up of IBS-C, throat discomfort and GERD. Patient has presented recurrent discomfort in her throat along with episodes of dysphagia that haven't improved after an empiric dilation. She is currently scheduled to undergo manometry - we discussed the possibility of adding a pH impedance study to evaluate the throat clearing further and determine if the symptoms are related to GERD. For now, she will need to continue omeprazole 20 mg day as this has controlled her subjective GERD symptoms.  Regarding her IBS-C, she will benefit from using Amitiza as she had significant improvement in the past with this medication. We discussed that the chronic use of this medication is safe and should spare her from using high dose Sennokot in the long term.  - Will add pH impedance testing to scheduled manometry - proceed with this test at [facility] -Continue omeprazole 20 mg qday -Start Amitiza 24 mcg BID -Attempt to decrease Sennakot as much as possible  All questions were answered.      Zoe Peppers, MD Gastroenterology and Hepatology [facility]

## 2021-11-28 NOTE — Patient Instructions (Addendum)
Will add pH impedance testing to scheduled manometry - proceed with this test at [facility] Continue omeprazole 20 mg qday Start Amitiza 24 mcg BID Attempt to decrease Sennakot as much as possible

## 2021-12-16 ENCOUNTER — Encounter (INDEPENDENT_AMBULATORY_CARE_PROVIDER_SITE_OTHER): Payer: Self-pay | Admitting: Gastroenterology

## 2021-12-17 DIAGNOSIS — J42 Unspecified chronic bronchitis: Secondary | ICD-10-CM | POA: Diagnosis not present

## 2021-12-17 DIAGNOSIS — Z299 Encounter for prophylactic measures, unspecified: Secondary | ICD-10-CM | POA: Diagnosis not present

## 2021-12-17 DIAGNOSIS — R059 Cough, unspecified: Secondary | ICD-10-CM | POA: Diagnosis not present

## 2021-12-17 DIAGNOSIS — R5383 Other fatigue: Secondary | ICD-10-CM | POA: Diagnosis not present

## 2021-12-17 DIAGNOSIS — J449 Chronic obstructive pulmonary disease, unspecified: Secondary | ICD-10-CM | POA: Diagnosis not present

## 2021-12-30 DIAGNOSIS — G629 Polyneuropathy, unspecified: Secondary | ICD-10-CM | POA: Diagnosis not present

## 2021-12-30 DIAGNOSIS — M159 Polyosteoarthritis, unspecified: Secondary | ICD-10-CM | POA: Diagnosis not present

## 2021-12-30 DIAGNOSIS — Z6825 Body mass index (BMI) 25.0-25.9, adult: Secondary | ICD-10-CM | POA: Diagnosis not present

## 2021-12-30 DIAGNOSIS — K589 Irritable bowel syndrome without diarrhea: Secondary | ICD-10-CM | POA: Diagnosis not present

## 2022-01-01 DIAGNOSIS — M79605 Pain in left leg: Secondary | ICD-10-CM | POA: Diagnosis not present

## 2022-01-01 DIAGNOSIS — Z299 Encounter for prophylactic measures, unspecified: Secondary | ICD-10-CM | POA: Diagnosis not present

## 2022-01-01 DIAGNOSIS — M79662 Pain in left lower leg: Secondary | ICD-10-CM | POA: Diagnosis not present

## 2022-01-01 DIAGNOSIS — I10 Essential (primary) hypertension: Secondary | ICD-10-CM | POA: Diagnosis not present

## 2022-01-01 DIAGNOSIS — Z789 Other specified health status: Secondary | ICD-10-CM | POA: Diagnosis not present

## 2022-01-01 DIAGNOSIS — M7121 Synovial cyst of popliteal space [Baker], right knee: Secondary | ICD-10-CM | POA: Diagnosis not present

## 2022-01-01 DIAGNOSIS — M79661 Pain in right lower leg: Secondary | ICD-10-CM | POA: Diagnosis not present

## 2022-01-01 DIAGNOSIS — M79604 Pain in right leg: Secondary | ICD-10-CM | POA: Diagnosis not present

## 2022-01-09 DIAGNOSIS — I739 Peripheral vascular disease, unspecified: Secondary | ICD-10-CM | POA: Diagnosis not present

## 2022-01-09 DIAGNOSIS — Z299 Encounter for prophylactic measures, unspecified: Secondary | ICD-10-CM | POA: Diagnosis not present

## 2022-01-09 DIAGNOSIS — I10 Essential (primary) hypertension: Secondary | ICD-10-CM | POA: Diagnosis not present

## 2022-01-09 DIAGNOSIS — D692 Other nonthrombocytopenic purpura: Secondary | ICD-10-CM | POA: Diagnosis not present

## 2022-01-09 DIAGNOSIS — M7121 Synovial cyst of popliteal space [Baker], right knee: Secondary | ICD-10-CM | POA: Diagnosis not present

## 2022-01-10 DIAGNOSIS — H40013 Open angle with borderline findings, low risk, bilateral: Secondary | ICD-10-CM | POA: Diagnosis not present

## 2022-02-03 DIAGNOSIS — L820 Inflamed seborrheic keratosis: Secondary | ICD-10-CM | POA: Diagnosis not present

## 2022-02-03 DIAGNOSIS — L723 Sebaceous cyst: Secondary | ICD-10-CM | POA: Diagnosis not present

## 2022-02-03 DIAGNOSIS — L821 Other seborrheic keratosis: Secondary | ICD-10-CM | POA: Diagnosis not present

## 2022-02-03 DIAGNOSIS — L570 Actinic keratosis: Secondary | ICD-10-CM | POA: Diagnosis not present

## 2022-02-07 DIAGNOSIS — Z7951 Long term (current) use of inhaled steroids: Secondary | ICD-10-CM | POA: Diagnosis not present

## 2022-02-07 DIAGNOSIS — G629 Polyneuropathy, unspecified: Secondary | ICD-10-CM | POA: Diagnosis not present

## 2022-02-07 DIAGNOSIS — J449 Chronic obstructive pulmonary disease, unspecified: Secondary | ICD-10-CM | POA: Diagnosis not present

## 2022-02-10 DIAGNOSIS — I70213 Atherosclerosis of native arteries of extremities with intermittent claudication, bilateral legs: Secondary | ICD-10-CM | POA: Diagnosis not present

## 2022-02-26 ENCOUNTER — Ambulatory Visit: Payer: PPO | Admitting: Orthopaedic Surgery

## 2022-03-05 ENCOUNTER — Ambulatory Visit (INDEPENDENT_AMBULATORY_CARE_PROVIDER_SITE_OTHER): Payer: HMO | Admitting: Orthopaedic Surgery

## 2022-03-05 ENCOUNTER — Ambulatory Visit (INDEPENDENT_AMBULATORY_CARE_PROVIDER_SITE_OTHER): Payer: HMO

## 2022-03-05 ENCOUNTER — Encounter: Payer: Self-pay | Admitting: Orthopaedic Surgery

## 2022-03-05 DIAGNOSIS — M25511 Pain in right shoulder: Secondary | ICD-10-CM

## 2022-03-05 DIAGNOSIS — M25512 Pain in left shoulder: Secondary | ICD-10-CM

## 2022-03-05 DIAGNOSIS — G8929 Other chronic pain: Secondary | ICD-10-CM

## 2022-03-05 NOTE — Progress Notes (Signed)
The patient is an 87 year old female that is been seen at this practice over the years.  She comes in with bilateral shoulder pain with she says the right worse than the left.  She has had 3 months ago she said a stock cart ran into her right shoulder and she has had some pain since then.  She denies any numbness and tingling in her hands and denies any neck pain.  She says sometimes she feels better with having her arms above her head.  She says pain is in both shoulders but she has had no decrease in motion she feels.  She does not tolerate pain medications due to chronic constipation.  She has had steroid injections in the past remotely.  Examination of both shoulders fortunately show they are well located.  She seems to be using her rotator cuff appropriately for abduction but there is certainly pain throughout the arc of motion of both shoulders.  It is difficult to get both shoulders fully abducted or fully forward flex.  I feel just some slight grinding of both shoulders with rotation.  3 views of both shoulders do show glenohumeral arthritis.  I would like to send her to my partner Dr. Rolena Infante for an ultrasound assessment of both shoulders and hopefully an intra-articular injection with steroid in both shoulder joints.  As she was leaving she asked that he be able to potentially take a look at a Baker's cyst in her knee and I think is reasonable to have a regular appointment with him instead of a work can in order to have a full assessment of everything.  She agrees with this treatment plan.  We will work on getting an appoint with Dr. Rolena Infante.

## 2022-03-06 ENCOUNTER — Other Ambulatory Visit: Payer: Self-pay

## 2022-03-06 ENCOUNTER — Telehealth: Payer: Self-pay | Admitting: Orthopaedic Surgery

## 2022-03-06 MED ORDER — DICLOFENAC SODIUM 1 % EX GEL
2.0000 g | Freq: Four times a day (QID) | CUTANEOUS | 3 refills | Status: AC
Start: 2022-03-06 — End: ?

## 2022-03-06 NOTE — Telephone Encounter (Signed)
Called into pharmacy

## 2022-03-06 NOTE — Telephone Encounter (Signed)
Patient need new rx for Diclofenac gel to [employer]

## 2022-03-10 ENCOUNTER — Ambulatory Visit: Payer: HMO | Admitting: Sports Medicine

## 2022-03-10 ENCOUNTER — Ambulatory Visit: Payer: PPO | Admitting: Orthopaedic Surgery

## 2022-03-11 ENCOUNTER — Ambulatory Visit (INDEPENDENT_AMBULATORY_CARE_PROVIDER_SITE_OTHER): Payer: HMO | Admitting: Sports Medicine

## 2022-03-11 ENCOUNTER — Ambulatory Visit: Payer: Self-pay

## 2022-03-11 ENCOUNTER — Encounter: Payer: Self-pay | Admitting: Sports Medicine

## 2022-03-11 DIAGNOSIS — G8929 Other chronic pain: Secondary | ICD-10-CM

## 2022-03-11 DIAGNOSIS — M25512 Pain in left shoulder: Secondary | ICD-10-CM | POA: Diagnosis not present

## 2022-03-11 DIAGNOSIS — M19011 Primary osteoarthritis, right shoulder: Secondary | ICD-10-CM | POA: Diagnosis not present

## 2022-03-11 DIAGNOSIS — M25511 Pain in right shoulder: Secondary | ICD-10-CM | POA: Diagnosis not present

## 2022-03-11 DIAGNOSIS — M19012 Primary osteoarthritis, left shoulder: Secondary | ICD-10-CM

## 2022-03-11 MED ORDER — BUPIVACAINE HCL 0.25 % IJ SOLN
2.0000 mL | INTRAMUSCULAR | Status: AC | PRN
  Administered 2022-03-11: 2 mL via INTRA_ARTICULAR

## 2022-03-11 MED ORDER — LIDOCAINE HCL 1 % IJ SOLN
2.0000 mL | INTRAMUSCULAR | Status: AC | PRN
  Administered 2022-03-11: 2 mL

## 2022-03-11 MED ORDER — METHYLPREDNISOLONE ACETATE 40 MG/ML IJ SUSP
40.0000 mg | INTRAMUSCULAR | Status: AC | PRN
  Administered 2022-03-11: 40 mg via INTRA_ARTICULAR

## 2022-03-11 NOTE — Progress Notes (Signed)
Zoe [name] - 87 y.o. female MRN [id number]  Date of birth: 02/28/1935  Office Visit Note: Visit Date: 03/11/2022 PCP: Glenda Chroman, MD Referred by: Glenda Chroman, MD  Subjective: Chief Complaint  Patient presents with   Left Shoulder - Pain   Right Shoulder - Pain   HPI: Zoe Beath "Zoe [name]" is a very pleasant 87 y.o. female who presents today for bilateral shoulder pain. Her granddaughter is present at today's visit.  Zoe [name] presents with chronic bilateral shoulder pain.  She has had x-rays that show osteoarthritic change.  The right shoulder is worse than the left, feels like this was exacerbated when he stopped cart ran into her at the store about 3 months ago.  Having pain lying on each of the shoulders at nighttime.  She used to be a belly sleeper, although this is difficult because of her pain.  She does take Tylenol with pain, although not significantly helpful.  Pertinent ROS were reviewed with the patient and found to be negative unless otherwise specified above in HPI.   Assessment & Plan: Visit Diagnoses:  1. Chronic right shoulder pain   2. Chronic left shoulder pain   3. Primary osteoarthritis of both shoulders    Plan: Discussed with Zoe [name] and family today the nature of her bilateral shoulder pain.  I do think she is doing more so with an exacerbation of her underlying glenohumeral joint arthritic change.  Limited ultrasound today does show rotator cuff arthropathy with a partial tear of the right bicep tendon but I think there is also a main driver of her pain.  The left shoulder shows an absent biceps tendon within the bicipital groove, however this is not painful for her so likely has been chronic or incompletely visualized on ultrasound examination.  We discussed all treatment options, through shared decision making elected to proceed with ultrasound-guided glenohumeral joint injection bilaterally, she tolerated procedures well.  Would like to see how much  improvement she gets over the next 2 weeks, if she is not at least 50% improved, we may consider a subacromial joint injection at a later date.  She may use Tylenol and or ice for any postinjection pain. Home or PT may be beneficial in the future as well.   Follow-up: Return in about 2 weeks (around 03/25/2022) for f/u in 2 weeks if not > 50% improvement .   Meds & Orders: No orders of the defined types were placed in this encounter.   Orders Placed This Encounter  Procedures   Large Joint Inj: R glenohumeral   Large Joint Inj: L glenohumeral   [place] Extrem Up Right Ltd   [place] Extrem Up Left Ltd     Procedures: Large Joint Inj: R glenohumeral on 03/11/2022 1:41 PM Indications: pain Details: 22 G 3.5 in needle, ultrasound-guided posterior approach Medications: 2 mL lidocaine 1 %; 2 mL bupivacaine 0.25 %; 40 mg methylPREDNISolone acetate 40 MG/ML Outcome: tolerated well, no immediate complications  US-guided glenohumeral joint injection, right shoulder After discussion on risks/benefits/indications, informed verbal consent was obtained. A timeout was then performed. The patient was positioned lying lateral recumbent on examination table. The patient's shoulder was prepped with betadine and multiple alcohol swabs and utilizing ultrasound guidance, the patient's glenohumeral joint was identified on ultrasound. Using ultrasound guidance a 22-gauge, 3.5 inch needle with a mixture of 2:2:1 cc's lidocaine:bupivicaine:depomedrol was directed from a lateral to medial direction via in-plane technique into the glenohumeral joint with visualization of appropriate spread of  injectate into the joint. Patient tolerated the procedure well without immediate complications.      Procedure, treatment alternatives, risks and benefits explained, specific risks discussed. Consent was given by the patient. Immediately prior to procedure a time out was called to verify the correct patient, procedure, equipment, support  staff and site/side marked as required. Patient was prepped and draped in the usual sterile fashion.    Large Joint Inj: L glenohumeral on 03/11/2022 1:42 PM Indications: pain Details: 22 G 3.5 in needle, ultrasound-guided posterior approach Medications: 2 mL lidocaine 1 %; 2 mL bupivacaine 0.25 %; 40 mg methylPREDNISolone acetate 40 MG/ML Outcome: tolerated well, no immediate complications  US-guided glenohumeral joint injection, left shoulder After discussion on risks/benefits/indications, informed verbal consent was obtained. A timeout was then performed. The patient was positioned lying lateral recumbent on examination table. The patient's shoulder was prepped with betadine and multiple alcohol swabs and utilizing ultrasound guidance, the patient's glenohumeral joint was identified on ultrasound. Using ultrasound guidance a 22-gauge, 3.5 inch needle with a mixture of 2:2:1 cc's lidocaine:bupivicaine:depomedrol was directed from a lateral to medial direction via in-plane technique into the glenohumeral joint with visualization of appropriate spread of injectate into the joint. Patient tolerated the procedure well without immediate complications.      Procedure, treatment alternatives, risks and benefits explained, specific risks discussed. Consent was given by the patient. Immediately prior to procedure a time out was called to verify the correct patient, procedure, equipment, support staff and site/side marked as required. Patient was prepped and draped in the usual sterile fashion.          Clinical History: No specialty comments available.  She reports that she quit smoking about 18 months ago. Her smoking use included cigarettes. She has a 12.50 pack-year smoking history. She has never used smokeless tobacco. No results for input(s): "HGBA1C", "LABURIC" in the last 8760 hours.  Objective:   Physical Exam  Gen: Well-appearing, in no acute distress; non-toxic CV:  Well-perfused. Warm.   Resp: Breathing unlabored on room air; no wheezing. Psych: Fluid speech in conversation; appropriate affect; normal thought process Neuro: Sensation intact throughout. No gross coordination deficits.   Ortho Exam - Right shoulder: Positive TTP over the anterior joint as well as within the bicipital groove.  There is no significant swelling or redness.  Forward flexion and external rotation are slightly limited both active and passively.  Painful speeds test.  - Left shoulder: No specific bony TTP throughout the shoulder.  There is some grinding noted with internal and external rotation at end range.  Positive pain at Codman's point.  Imaging: [place] Extrem Up Left Ltd  Result Date: 03/11/2022 Limited musculoskeletal ultrasound of the left upper extremity, left shoulder was performed today.  Evaluation of the glenohumeral joint shows a well located humeral head.  There is some cortical irregularity, indicative of osteoarthritic change.  There is a small joint effusion noted.  Evaluation of the overlying infraspinatus and teres minor tendon shows no gross full-thickness tearing.  There is some scattered calcific tendinopathy changes in the infraspinatus.  Evaluation of the biceps tendon in short axis shows an absent bicep tendon seated within the bicipital groove.  There is rather significant tendinopathic changes of the supraspinatus with insertional cortical irregularity.  [place] Extrem Up Right Ltd  Result Date: 03/11/2022 Limited musculoskeletal ultrasound of the right upper extremity, right shoulder was performed today.  Evaluation of the glenohumeral joint shows intact humeral head.  There is cortical irregularity indicative of osteoarthritic  change.  There is some scattered calcification within the overlying labrum that is incompletely visualized.  The evaluation of the teres minor and infraspinatus tendon shows no full-thickness tearing.  Short axis evaluation of the bicep tendon notes it well-seated  within the bicipital groove, although there is evidence of partial tearing of this tendon with a small amount of hypoechoic fluid.  Supraspinatus tendon appears intact without any evidence of high-grade tearing.  No significant subacromial/subdeltoid bursal distention.   Past Medical/Family/Surgical/Social History: Medications & Allergies reviewed per EMR, new medications updated. Patient Active Problem List   Diagnosis Date Noted   Aspiration into airway 11/28/2021   IBS (irritable bowel syndrome) 11/28/2021   SSBE (short-segment Barrett's esophagus) 06/25/2021   Lip pain 11/01/2020   Claudication ([place]) 08/02/2020   Hearing loss, right 07/16/2020   Seasonal allergies 07/04/2020   Insomnia 07/04/2020   Osteoporosis 07/04/2020   Lumbosacral spondylosis without myelopathy 06/01/2020   Polyarthritis 04/03/2020   Nausea 04/03/2020   Leg swelling 01/31/2020   Chronic diastolic CHF (congestive heart failure) ([place]) 01/31/2020   Abdominal pain 01/10/2020   Esophageal dysphagia 06/21/2019   Stenosis of lateral recess of lumbar spine 12/06/2018   Foraminal stenosis of lumbar region 12/06/2018   Spondylosis without myelopathy or radiculopathy, lumbar region 12/06/2018   Other idiopathic scoliosis, thoracolumbar region 12/06/2018   Chronic left shoulder pain 12/06/2018   History of colonic polyps 10/22/2016   SBO (small bowel obstruction) ([place]) 03/02/2013   Hyperlipidemia 12/08/2012   Gout 12/08/2012   Dysrhythmia 12/08/2012   Depression 12/08/2012   Asthma 12/08/2012   Fibromyalgia 12/08/2012   Constipation 12/08/2012   Acute posthemorrhagic anemia 12/07/2012   Benign essential hypertension 12/07/2012   Degenerative arthritis of hip, left 11/05/2012   Weakness of right leg 03/24/2011   OA (osteoarthritis) 03/24/2011   Stiffness of cervical spine 03/24/2011   GERD (gastroesophageal reflux disease) 11/19/2010   Spinal stenosis 04/18/2009   HIP PAIN 11/17/2007   DDD (degenerative disc  disease), lumbosacral 11/17/2007   ANSERINE BURSITIS, RIGHT 08/11/2007   Sciatica 06/22/2007   Knee pain 03/17/2007   Past Medical History:  Diagnosis Date   Abdominal discomfort 11/02/2012   suspected ?UTI- PCP MD placed on Cipro   Anxiety    Arthritis    Asthma    Cancer ([place])    skin CA   Chronic constipation    Depression    Dysrhythmia    tachycardia   Environmental allergies    Fibromyalgia    GERD (gastroesophageal reflux disease)    Hemorrhoids    Hypercholesteremia    Hypertension    Sleep apnea    Family History  Problem Relation Age of Onset   Healthy Son    Healthy Son    Healthy Daughter    Colon cancer Neg Hx    Past Surgical History:  Procedure Laterality Date   ANAL RECTAL MANOMETRY N/A 11/28/2013   Procedure: ANO RECTAL MANOMETRY;  Surgeon: Leighton Ruff, MD;  Location: [facility];  Service: Endoscopy;  Laterality: N/A;   APPENDECTOMY     BACK SURGERY     X2    BIOPSY  03/13/2021   Procedure: BIOPSY;  Surgeon: Rogene Houston, MD;  Location: [facility];  Service: Endoscopy;;   CHOLECYSTECTOMY     COLONOSCOPY  03/29/03   COLONOSCOPY N/A 12/09/2013   Procedure: COLONOSCOPY;  Surgeon: Rogene Houston, MD;  [facility]: [facility];  Service: Endoscopy;  Laterality: N/A;  730   COLONOSCOPY N/A 02/04/2017  Procedure: COLONOSCOPY;  Surgeon: Rogene Houston, MD;  [facility]: [facility];  Service: Endoscopy;  Laterality: N/A;  930   ESOPHAGEAL DILATION N/A 03/13/2021   Procedure: ESOPHAGEAL DILATION;  Surgeon: Rogene Houston, MD;  [facility];  Service: Endoscopy;  Laterality: N/A;   ESOPHAGOGASTRODUODENOSCOPY (EGD) WITH PROPOFOL N/A 03/13/2021   Procedure: ESOPHAGOGASTRODUODENOSCOPY (EGD) WITH PROPOFOL;  Surgeon: Rogene Houston, MD;  [facility]: [facility];  Service: Endoscopy;  Laterality: N/A;  8:05   EYE SURGERY     bilateral cataract removal   IR KYPHO LUMBAR INC FX REDUCE BONE BX UNI/BIL CANNULATION INC/IMAGING  11/24/2019   IR  KYPHO THORACIC WITH BONE BIOPSY  08/23/2019   IR KYPHO THORACIC WITH BONE BIOPSY  11/24/2019   IR RADIOLOGIST EVAL & MGMT  08/03/2019   IR RADIOLOGIST EVAL & MGMT  09/29/2019   IR RADIOLOGIST EVAL & MGMT  10/18/2019   IR RADIOLOGIST EVAL & MGMT  04/11/2020   SKIN CANCER EXCISION     RIGHT NECK     TONSILLECTOMY     T+A   TOTAL HIP ARTHROPLASTY Right 09/14/2012   Procedure: RIGHT TOTAL HIP ARTHROPLASTY ANTERIOR APPROACH and RIGHT KNEE STEROID INJECTION;  Surgeon: Mcarthur Rossetti, MD;  Location: [facility];  Service: Orthopedics;  Laterality: Right;   TOTAL HIP ARTHROPLASTY Left 11/05/2012   Procedure: LEFT TOTAL HIP ARTHROPLASTY ANTERIOR APPROACH;  Surgeon: Mcarthur Rossetti, MD;  Location: [facility];  Service: Orthopedics;  Laterality: Left;   UMBILICAL HERNIA REPAIR  04/26/03   JENKINS   UPPER GASTROINTESTINAL ENDOSCOPY  08/03/2009   NUR   UPPER GASTROINTESTINAL ENDOSCOPY  03/29/2003   EGD ED TCS   Social History   Occupational History   Not on file  Tobacco Use   Smoking status: Former    Packs/day: 0.25    Years: 50.00    Total pack years: 12.50    Types: Cigarettes    Quit date: 09/06/2020    Years since quitting: 1.5   Smokeless tobacco: Never  Vaping Use   Vaping Use: Never used  Substance and Sexual Activity   Alcohol use: Yes    Alcohol/week: 0.0 standard drinks of alcohol    Comment: rare   Drug use: No   Sexual activity: Not Currently    Birth control/protection: Post-menopausal

## 2022-03-11 NOTE — Progress Notes (Signed)
Bilateral shoulder pain Right is worse than left Years of pain; with worsening pain over the last few months She does have occasional pain into the hands Her main "severe" pain is anterior shoulder over the pec muscles  She takes tylenol for pain

## 2022-03-31 ENCOUNTER — Ambulatory Visit (INDEPENDENT_AMBULATORY_CARE_PROVIDER_SITE_OTHER): Payer: PPO | Admitting: Gastroenterology

## 2022-04-09 DIAGNOSIS — F1320 Sedative, hypnotic or anxiolytic dependence, uncomplicated: Secondary | ICD-10-CM | POA: Diagnosis not present

## 2022-04-09 DIAGNOSIS — Z1331 Encounter for screening for depression: Secondary | ICD-10-CM | POA: Diagnosis not present

## 2022-04-09 DIAGNOSIS — Z7189 Other specified counseling: Secondary | ICD-10-CM | POA: Diagnosis not present

## 2022-04-09 DIAGNOSIS — I739 Peripheral vascular disease, unspecified: Secondary | ICD-10-CM | POA: Diagnosis not present

## 2022-04-09 DIAGNOSIS — Z87891 Personal history of nicotine dependence: Secondary | ICD-10-CM | POA: Diagnosis not present

## 2022-04-09 DIAGNOSIS — Z299 Encounter for prophylactic measures, unspecified: Secondary | ICD-10-CM | POA: Diagnosis not present

## 2022-04-09 DIAGNOSIS — Z0000 Encounter for general adult medical examination without abnormal findings: Secondary | ICD-10-CM | POA: Diagnosis not present

## 2022-04-09 DIAGNOSIS — J449 Chronic obstructive pulmonary disease, unspecified: Secondary | ICD-10-CM | POA: Diagnosis not present

## 2022-04-09 DIAGNOSIS — Z1339 Encounter for screening examination for other mental health and behavioral disorders: Secondary | ICD-10-CM | POA: Diagnosis not present

## 2022-04-09 DIAGNOSIS — I10 Essential (primary) hypertension: Secondary | ICD-10-CM | POA: Diagnosis not present

## 2022-04-17 ENCOUNTER — Other Ambulatory Visit: Payer: Self-pay

## 2022-04-17 ENCOUNTER — Encounter: Payer: Self-pay | Admitting: Sports Medicine

## 2022-04-17 ENCOUNTER — Ambulatory Visit (INDEPENDENT_AMBULATORY_CARE_PROVIDER_SITE_OTHER): Payer: HMO | Admitting: Sports Medicine

## 2022-04-17 ENCOUNTER — Other Ambulatory Visit (INDEPENDENT_AMBULATORY_CARE_PROVIDER_SITE_OTHER): Payer: HMO

## 2022-04-17 DIAGNOSIS — G8929 Other chronic pain: Secondary | ICD-10-CM

## 2022-04-17 DIAGNOSIS — M25561 Pain in right knee: Secondary | ICD-10-CM

## 2022-04-17 DIAGNOSIS — M170 Bilateral primary osteoarthritis of knee: Secondary | ICD-10-CM

## 2022-04-17 DIAGNOSIS — M7121 Synovial cyst of popliteal space [Baker], right knee: Secondary | ICD-10-CM | POA: Diagnosis not present

## 2022-04-17 MED ORDER — METHYLPREDNISOLONE ACETATE 40 MG/ML IJ SUSP
80.0000 mg | INTRAMUSCULAR | Status: AC | PRN
  Administered 2022-04-17: 80 mg via INTRA_ARTICULAR

## 2022-04-17 MED ORDER — BUPIVACAINE HCL 0.25 % IJ SOLN
2.0000 mL | INTRAMUSCULAR | Status: AC | PRN
  Administered 2022-04-17: 2 mL via INTRA_ARTICULAR

## 2022-04-17 MED ORDER — LIDOCAINE HCL 1 % IJ SOLN
2.0000 mL | INTRAMUSCULAR | Status: AC | PRN
  Administered 2022-04-17: 2 mL

## 2022-04-17 NOTE — Progress Notes (Signed)
Zoe [name] - 87 y.o. female MRN [id number]  Date of birth: September 29, 1935  Office Visit Note: Visit Date: 04/17/2022 PCP: Glenda Chroman, MD Referred by: Glenda Chroman, MD  Subjective: No chief complaint on file.  HPI: Zoe [name] is a pleasant 87 y.o. female who presents today for acute on chronic right knee.   She has had some right knee pain similar between the 3-3-month mark.  Denies any specific injury or inciting event.  The pain is over the front aspect of the knee, sometimes goes to the back.  She will notice swelling, up and down.  When she is more active later in the day she will notice some swelling.  Denies any warmth or redness. Was told in the past that she may have had a Baker's cyst. Has not had imaging dedicated for this.  Postop topical Voltaren gel as well as taking meloxicam 15 mg daily as needed.  Pertinent ROS were reviewed with the patient and found to be negative unless otherwise specified above in HPI.   Assessment & Plan: Visit Diagnoses:  1. Chronic pain of right knee   2. Bilateral primary osteoarthritis of knee   3. Baker's cyst of knee, right    Plan: Discussed with Fraser Din today the likely etiology of her knee pain, which I think is more so an exacerbation of her knee osteoarthritis.  This is more so within the medial compartment of the knee joint, although certainly is not advanced arthritis.  We did ultrasound the knee and did note a small hypoechoic structure likely indicative of a Baker's cyst.  This is small but I do not think aspiration would be of benefit.  We discussed all treatment options as oral medication therapy, PT, injection therapy, advanced imaging.  Through shared decision-making, elected to proceed with corticosteroid injection into the right knee, patient tolerated well.  She may continue her meloxicam 15 mg daily as needed for her pain.  I would like to see what sort of relief she gets over the next 1 to 2 weeks.  If for some reason she  is still having pain, we discussed possibly getting an MRI of the knee to ensure that the posterior compartment was a Baker's cyst but not other internal structures.  We will hold on this for now.  She will follow-up with me as needed.  Follow-up: Return if symptoms worsen or fail to improve.   Meds & Orders:  -  Continue Meloxicam 15mg  qd as needed for knee pain  Orders Placed This Encounter  Procedures   Large Joint Inj   [place] Extrem Low Right Ltd   XR Knee Complete 4 Views Right     Procedures: Large Joint Inj: R knee on 04/17/2022 11:52 AM Details: 22 G 1.5 in needle, anterolateral approach Medications: 2 mL lidocaine 1 %; 2 mL bupivacaine 0.25 %; 80 mg methylPREDNISolone acetate 40 MG/ML Outcome: tolerated well, no immediate complications  Knee Injection, Right: After discussion on risks/benefits/indications, informed verbal consent was obtained and a timeout was performed, patient was seated on exam table. The patient's knee was prepped with Betadine and alcohol swab and utilizing anterolateral approach, the patient's knee was injected intraarticularly with 2:2:2 lidocaine 1%:bupivicaine 0.25%:depomedrol. Patient tolerated the procedure well without immediate complications.  Procedure, treatment alternatives, risks and benefits explained, specific risks discussed. Consent was given by the patient. Immediately prior to procedure a time out was called to verify the correct patient, procedure, equipment, support staff and site/side marked  as required. Patient was prepped and draped in the usual sterile fashion.          Clinical History: No specialty comments available.  She reports that she quit smoking about 19 months ago. Her smoking use included cigarettes. She has a 12.50 pack-year smoking history. She has never used smokeless tobacco. No results for input(s): "HGBA1C", "LABURIC" in the last 8760 hours.  Objective:    Physical Exam  Gen: Well-appearing, in no acute distress;  non-toxic CV: Well-perfused. Warm.  Resp: Breathing unlabored on room air; no wheezing. Psych: Fluid speech in conversation; appropriate affect; normal thought process Neuro: Sensation intact throughout. No gross coordination deficits.   Ortho Exam - Right knee: No erythema or redness of the knee.  There is both medial and lateral joint line TTP.  There is a very small mobile structure in the popliteal fossa.  Range of motion from 0-125 degrees.  Ligamentously intact.  She does walk with a mildly antalgic gait, with assistance of a cane.  Imaging:  *Review of 2-view right knee x-ray from 11/12/16 -evaluated by myself today, x-rays show relatively well-preserved tricompartmental joint space.  No acute fracture.  No joint effusion.  Comparing his x-rays from today's updated x-rays, there is progression of osteoarthritic change, more so in the medial compartment.  [place] Extrem Low Right Ltd  Result Date: 04/17/2022 Limited musculoskeletal ultrasound of the right lower extremity, right knee was performed today.  Ultrasound shows no cortical regularity of the patella.  There is no significant effusion within the knee.  Evaluation of the posterior knee and popliteal fossa shows a mixed echogenic hypoechoic collection of 1.4 x 1 cm in size. This structure does sit next to semimembranosus and medial gastrocnemius tendons.  Likely indicative of small Baker's cyst.  There is some notable hyperemia surrounding this however.  XR Knee Complete 4 Views Right  Result Date: 04/17/2022 4 views of the right knee including bilateral AP standing, Rosenberg, lateral and sunrise views were ordered and reviewed by myself.  X-rays demonstrate moderate medial joint space narrowing of the right knee with osteoarthritis, mild on the left.  Lateral joint line and patellofemoral joint are relatively well-preserved.  No acute fracture or other bony abnormality noted.  Very mild degree of likely chondrocalcinosis within the  joints.     Past Medical/Family/Surgical/Social History: Medications & Allergies reviewed per EMR, new medications updated. Patient Active Problem List   Diagnosis Date Noted   Aspiration into airway 11/28/2021   IBS (irritable bowel syndrome) 11/28/2021   SSBE (short-segment Barrett's esophagus) 06/25/2021   Lip pain 11/01/2020   Claudication ([place]) 08/02/2020   Hearing loss, right 07/16/2020   Seasonal allergies 07/04/2020   Insomnia 07/04/2020   Osteoporosis 07/04/2020   Lumbosacral spondylosis without myelopathy 06/01/2020   Polyarthritis 04/03/2020   Nausea 04/03/2020   Leg swelling 01/31/2020   Chronic diastolic CHF (congestive heart failure) ([place]) 01/31/2020   Abdominal pain 01/10/2020   Esophageal dysphagia 06/21/2019   Stenosis of lateral recess of lumbar spine 12/06/2018   Foraminal stenosis of lumbar region 12/06/2018   Spondylosis without myelopathy or radiculopathy, lumbar region 12/06/2018   Other idiopathic scoliosis, thoracolumbar region 12/06/2018   Chronic left shoulder pain 12/06/2018   History of colonic polyps 10/22/2016   SBO (small bowel obstruction) ([place]) 03/02/2013   Hyperlipidemia 12/08/2012   Gout 12/08/2012   Dysrhythmia 12/08/2012   Depression 12/08/2012   Asthma 12/08/2012   Fibromyalgia 12/08/2012   Constipation 12/08/2012   Acute posthemorrhagic anemia 12/07/2012  Benign essential hypertension 12/07/2012   Degenerative arthritis of hip, left 11/05/2012   Weakness of right leg 03/24/2011   OA (osteoarthritis) 03/24/2011   Stiffness of cervical spine 03/24/2011   GERD (gastroesophageal reflux disease) 11/19/2010   Spinal stenosis 04/18/2009   HIP PAIN 11/17/2007   DDD (degenerative disc disease), lumbosacral 11/17/2007   ANSERINE BURSITIS, RIGHT 08/11/2007   Sciatica 06/22/2007   Knee pain 03/17/2007   Past Medical History:  Diagnosis Date   Abdominal discomfort 11/02/2012   suspected ?UTI- PCP MD placed on Cipro   Anxiety     Arthritis    Asthma    Cancer ([place])    skin CA   Chronic constipation    Depression    Dysrhythmia    tachycardia   Environmental allergies    Fibromyalgia    GERD (gastroesophageal reflux disease)    Hemorrhoids    Hypercholesteremia    Hypertension    Sleep apnea    Family History  Problem Relation Age of Onset   Healthy Son    Healthy Son    Healthy Daughter    Colon cancer Neg Hx    Past Surgical History:  Procedure Laterality Date   ANAL RECTAL MANOMETRY N/A 11/28/2013   Procedure: ANO RECTAL MANOMETRY;  Surgeon: Leighton Ruff, MD;  [facility];  Service: Endoscopy;  Laterality: N/A;   APPENDECTOMY     BACK SURGERY     X2    BIOPSY  03/13/2021   Procedure: BIOPSY;  Surgeon: Rogene Houston, MD;  [facility];  Service: Endoscopy;;   CHOLECYSTECTOMY     COLONOSCOPY  03/29/03   COLONOSCOPY N/A 12/09/2013   Procedure: COLONOSCOPY;  Surgeon: Rogene Houston, MD;  [facility];  Service: Endoscopy;  Laterality: N/A;  730   COLONOSCOPY N/A 02/04/2017   Procedure: COLONOSCOPY;  Surgeon: Rogene Houston, MD;  [facility];  Service: Endoscopy;  Laterality: N/A;  930   ESOPHAGEAL DILATION N/A 03/13/2021   Procedure: ESOPHAGEAL DILATION;  Surgeon: Rogene Houston, MD;  [facility];  Service: Endoscopy;  Laterality: N/A;   ESOPHAGOGASTRODUODENOSCOPY (EGD) WITH PROPOFOL N/A 03/13/2021   Procedure: ESOPHAGOGASTRODUODENOSCOPY (EGD) WITH PROPOFOL;  Surgeon: Rogene Houston, MD;  [facility];  Service: Endoscopy;  Laterality: N/A;  8:05   EYE SURGERY     bilateral cataract removal   IR KYPHO LUMBAR INC FX REDUCE BONE BX UNI/BIL CANNULATION INC/IMAGING  11/24/2019   IR KYPHO THORACIC WITH BONE BIOPSY  08/23/2019   IR KYPHO THORACIC WITH BONE BIOPSY  11/24/2019   IR RADIOLOGIST EVAL & MGMT  08/03/2019   IR RADIOLOGIST EVAL & MGMT  09/29/2019   IR RADIOLOGIST EVAL & MGMT  10/18/2019   IR RADIOLOGIST EVAL & MGMT  04/11/2020    SKIN CANCER EXCISION     RIGHT NECK     TONSILLECTOMY     T+A   TOTAL HIP ARTHROPLASTY Right 09/14/2012   Procedure: RIGHT TOTAL HIP ARTHROPLASTY ANTERIOR APPROACH and RIGHT KNEE STEROID INJECTION;  Surgeon: Mcarthur Rossetti, MD;  Location: [facility];  Service: Orthopedics;  Laterality: Right;   TOTAL HIP ARTHROPLASTY Left 11/05/2012   Procedure: LEFT TOTAL HIP ARTHROPLASTY ANTERIOR APPROACH;  Surgeon: Mcarthur Rossetti, MD;  [facility];  Service: Orthopedics;  Laterality: Left;   UMBILICAL HERNIA REPAIR  04/26/03   JENKINS   UPPER GASTROINTESTINAL ENDOSCOPY  08/03/2009   NUR   UPPER GASTROINTESTINAL ENDOSCOPY  03/29/2003  EGD ED TCS   Social History   Occupational History   Not on file  Tobacco Use   Smoking status: Former    Packs/day: 0.25    Years: 50.00    Additional pack years: 0.00    Total pack years: 12.50    Types: Cigarettes    Quit date: 09/06/2020    Years since quitting: 1.6   Smokeless tobacco: Never  Vaping Use   Vaping Use: Never used  Substance and Sexual Activity   Alcohol use: Yes    Alcohol/week: 0.0 standard drinks of alcohol    Comment: rare   Drug use: No   Sexual activity: Not Currently    Birth control/protection: Post-menopausal

## 2022-04-22 ENCOUNTER — Ambulatory Visit (INDEPENDENT_AMBULATORY_CARE_PROVIDER_SITE_OTHER): Payer: HMO | Admitting: Sports Medicine

## 2022-04-22 ENCOUNTER — Encounter: Payer: Self-pay | Admitting: Sports Medicine

## 2022-04-22 DIAGNOSIS — M25461 Effusion, right knee: Secondary | ICD-10-CM | POA: Diagnosis not present

## 2022-04-22 DIAGNOSIS — G8929 Other chronic pain: Secondary | ICD-10-CM | POA: Diagnosis not present

## 2022-04-22 DIAGNOSIS — M170 Bilateral primary osteoarthritis of knee: Secondary | ICD-10-CM | POA: Diagnosis not present

## 2022-04-22 DIAGNOSIS — M25561 Pain in right knee: Secondary | ICD-10-CM | POA: Diagnosis not present

## 2022-04-22 MED ORDER — DICLOFENAC SODIUM 50 MG PO TBEC: 50.0000 mg | DELAYED_RELEASE_TABLET | Freq: Two times a day (BID) | ORAL | 0 refills | Status: AC

## 2022-04-22 MED ORDER — DICLOFENAC SODIUM 50 MG PO TBEC: 50.0000 mg | DELAYED_RELEASE_TABLET | Freq: Two times a day (BID) | ORAL | 0 refills | Status: DC

## 2022-04-22 NOTE — Progress Notes (Signed)
Zoe [name] - 87 y.o. female MRN [id number]  Date of birth: [birth year]  Office Visit Note: Visit Date: 04/22/2022 PCP: Glenda Chroman, MD Referred by: Glenda Chroman, MD  Subjective: Chief Complaint  Patient presents with   Right Leg - Pain   HPI: Zoe [name] is a pleasant 87 y.o. female who presents today for acute on chronic right knee pain.  I do see her just last week for some posterior knee pain.  Did notice a very small fluid collection in the posterior knee, possible Baker's cyst.  We did proceed with corticosteroid injection into the knee at that time.  Unfortunately, Zoe [name] states that later that day she was out walking at the store when she felt like the knee twisted and she had a sharp onset of her knee pain.  The injection has not seemed to settle down her pain.  She has been taking Tylenol as well as gabapentin.  She has had pain for at least 6 months in the knee, but also feels like it is new her pain is different over the lateral side.  Has trouble weightbearing at times, using a cane.  Feels slightly warm.  Did have a fall few months ago but no recent falls.  Pertinent ROS were reviewed with the patient and found to be negative unless otherwise specified above in HPI.   Assessment & Plan: Visit Diagnoses:  1. Chronic pain of right knee   2. Pain and swelling of right knee   3. Bilateral primary osteoarthritis of knee    Plan: Discussed with Pat that I am slightly puzzled with the severity of her pain.  We did proceed with corticosteroid injection into that knee a week ago, but she feels like this did not help.  Also feels like she had an exacerbation later that day after the injection.  Continues with chronic knee pain, but now worsening pain over the lateral side of the knee joint.  Her exam suggest likely meniscal injury versus possible stress reaction/fracture over the lateral tibial plateau as this is where she is rather exquisitely tender.  In the past, we did  ultrasound the knee which did show a fluid collection in the posterior knee, possible Baker's cyst but did have some hyperemia around this.  Given the chronicity of her pain, the acute worsening with difficulty weightbearing and these above findings, I do think it is pertinent to proceed with urgent MRI of the knee.  Did discuss relative offloading with weightbearing either with a cane or a walker in the interim.  We will start her on diclofenac 50 mg to be taken twice to 3 times daily as needed.  I will call her with MRI results to discuss next steps.  Follow-up: Return for Will call patient with MRI results to discuss next steps.   Meds & Orders:  Meds ordered this encounter  Medications   DISCONTD: diclofenac (VOLTAREN) 50 MG EC tablet    Sig: Take 1 tablet (50 mg total) by mouth 2 (two) times daily for 40 doses. May take twice to three times a day as needed    Dispense:  40 tablet    Refill:  0   diclofenac (VOLTAREN) 50 MG EC tablet    Sig: Take 1 tablet (50 mg total) by mouth 2 (two) times daily for 40 doses. May take twice to three times a day as needed    Dispense:  40 tablet    Refill:  0  Orders Placed This Encounter  Procedures   MR Knee Right w/o contrast     Procedures: No procedures performed      Clinical History: No specialty comments available.  She reports that she quit smoking about 19 months ago. Her smoking use included cigarettes. She has a 12.50 pack-year smoking history. She has never used smokeless tobacco. No results for input(s): "HGBA1C", "LABURIC" in the last 8760 hours.  Objective:    Physical Exam  Gen: Well-appearing, in no acute distress; non-toxic CV: Regular Rate. Well-perfused. Warm.  Resp: Breathing unlabored on room air; no wheezing. Psych: Fluid speech in conversation; appropriate affect; normal thought process Neuro: Sensation intact throughout. No gross coordination deficits.   Ortho Exam - Right knee: There is a very mild soft tissue  swelling and possible trace effusion of the right knee.  Exquisite TTP over the lateral joint line as well as the lateral tibial plateau.  Negative Tinel's over the fibular head.  Range of motion from 0-125 degrees.  There is pain with McMurray's on the lateral joint line.  There is an antalgic gait.  Bilateral calf circumference just distal to the tibial tubercle is 14-inch of both right and left leg, negative Homan's sign.  Imaging:  [place] Extrem Low Right Ltd Limited musculoskeletal ultrasound of the right lower extremity, right  knee was performed today.  Ultrasound shows no cortical regularity of the  patella.  There is no significant effusion within the knee.  Evaluation of  the posterior knee and popliteal fossa shows a mixed echogenic hypoechoic  collection of 1.4 x 1 cm in size. This structure does sit next to  semimembranosus and medial gastrocnemius tendons.  Likely indicative of  small Baker's cyst.  There is some notable hyperemia surrounding this  however. XR Knee Complete 4 Views Right 4 views of the right knee including bilateral AP standing, Rosenberg,  lateral and sunrise views were ordered and reviewed by myself.  X-rays  demonstrate moderate medial joint space narrowing of the right knee with  osteoarthritis, mild on the left.  Lateral joint line and patellofemoral  joint are relatively well-preserved.  No acute fracture or other bony  abnormality noted.  Very mild degree of likely chondrocalcinosis within  the joints.    Past Medical/Family/Surgical/Social History: Medications & Allergies reviewed per EMR, new medications updated. Patient Active Problem List   Diagnosis Date Noted   Aspiration into airway 11/28/2021   IBS (irritable bowel syndrome) 11/28/2021   SSBE (short-segment Barrett's esophagus) 06/25/2021   Lip pain 11/01/2020   Claudication ([place]) 08/02/2020   Hearing loss, right 07/16/2020   Seasonal allergies 07/04/2020   Insomnia 07/04/2020    Osteoporosis 07/04/2020   Lumbosacral spondylosis without myelopathy 06/01/2020   Polyarthritis 04/03/2020   Nausea 04/03/2020   Leg swelling 01/31/2020   Chronic diastolic CHF (congestive heart failure) ([place]) 01/31/2020   Abdominal pain 01/10/2020   Esophageal dysphagia 06/21/2019   Stenosis of lateral recess of lumbar spine 12/06/2018   Foraminal stenosis of lumbar region 12/06/2018   Spondylosis without myelopathy or radiculopathy, lumbar region 12/06/2018   Other idiopathic scoliosis, thoracolumbar region 12/06/2018   Chronic left shoulder pain 12/06/2018   History of colonic polyps 10/22/2016   SBO (small bowel obstruction) ([place]) 03/02/2013   Hyperlipidemia 12/08/2012   Gout 12/08/2012   Dysrhythmia 12/08/2012   Depression 12/08/2012   Asthma 12/08/2012   Fibromyalgia 12/08/2012   Constipation 12/08/2012   Acute posthemorrhagic anemia 12/07/2012   Benign essential hypertension 12/07/2012   Degenerative  arthritis of hip, left 11/05/2012   Weakness of right leg 03/24/2011   OA (osteoarthritis) 03/24/2011   Stiffness of cervical spine 03/24/2011   GERD (gastroesophageal reflux disease) 11/19/2010   Spinal stenosis 04/18/2009   HIP PAIN 11/17/2007   DDD (degenerative disc disease), lumbosacral 11/17/2007   ANSERINE BURSITIS, RIGHT 08/11/2007   Sciatica 06/22/2007   Knee pain 03/17/2007   Past Medical History:  Diagnosis Date   Abdominal discomfort 11/02/2012   suspected ?UTI- PCP MD placed on Cipro   Anxiety    Arthritis    Asthma    Cancer ([place])    skin CA   Chronic constipation    Depression    Dysrhythmia    tachycardia   Environmental allergies    Fibromyalgia    GERD (gastroesophageal reflux disease)    Hemorrhoids    Hypercholesteremia    Hypertension    Sleep apnea    Family History  Problem Relation Age of Onset   Healthy Son    Healthy Son    Healthy Daughter    Colon cancer Neg Hx    Past Surgical History:  Procedure Laterality Date   ANAL  RECTAL MANOMETRY N/A 11/28/2013   Procedure: ANO RECTAL MANOMETRY;  Surgeon: Leighton Ruff, MD;  [facility];  Service: Endoscopy;  Laterality: N/A;   APPENDECTOMY     BACK SURGERY     X2    BIOPSY  03/13/2021   Procedure: BIOPSY;  Surgeon: Rogene Houston, MD;  [facility];  Service: Endoscopy;;   CHOLECYSTECTOMY     COLONOSCOPY  03/29/03   COLONOSCOPY N/A 12/09/2013   Procedure: COLONOSCOPY;  Surgeon: Rogene Houston, MD;  [facility];  Service: Endoscopy;  Laterality: N/A;  730   COLONOSCOPY N/A 02/04/2017   Procedure: COLONOSCOPY;  Surgeon: Rogene Houston, MD;  [facility];  Service: Endoscopy;  Laterality: N/A;  930   ESOPHAGEAL DILATION N/A 03/13/2021   Procedure: ESOPHAGEAL DILATION;  Surgeon: Rogene Houston, MD;  [facility];  Service: Endoscopy;  Laterality: N/A;   ESOPHAGOGASTRODUODENOSCOPY (EGD) WITH PROPOFOL N/A 03/13/2021   Procedure: ESOPHAGOGASTRODUODENOSCOPY (EGD) WITH PROPOFOL;  Surgeon: Rogene Houston, MD;  [facility];  Service: Endoscopy;  Laterality: N/A;  8:05   EYE SURGERY     bilateral cataract removal   IR KYPHO LUMBAR INC FX REDUCE BONE BX UNI/BIL CANNULATION INC/IMAGING  11/24/2019   IR KYPHO THORACIC WITH BONE BIOPSY  08/23/2019   IR KYPHO THORACIC WITH BONE BIOPSY  11/24/2019   IR RADIOLOGIST EVAL & MGMT  08/03/2019   IR RADIOLOGIST EVAL & MGMT  09/29/2019   IR RADIOLOGIST EVAL & MGMT  10/18/2019   IR RADIOLOGIST EVAL & MGMT  04/11/2020   SKIN CANCER EXCISION     RIGHT NECK     TONSILLECTOMY     T+A   TOTAL HIP ARTHROPLASTY Right 09/14/2012   Procedure: RIGHT TOTAL HIP ARTHROPLASTY ANTERIOR APPROACH and RIGHT KNEE STEROID INJECTION;  Surgeon: Mcarthur Rossetti, MD;  Location: [facility];  Service: Orthopedics;  Laterality: Right;   TOTAL HIP ARTHROPLASTY Left 11/05/2012   Procedure: LEFT TOTAL HIP ARTHROPLASTY ANTERIOR APPROACH;  Surgeon: Mcarthur Rossetti, MD;  [facility];   Service: Orthopedics;  Laterality: Left;   UMBILICAL HERNIA REPAIR  04/26/03   JENKINS   UPPER GASTROINTESTINAL ENDOSCOPY  08/03/2009   NUR   UPPER GASTROINTESTINAL ENDOSCOPY  03/29/2003   E[facility] ED TCS   Social  History   Occupational History   Not on file  Tobacco Use   Smoking status: Former    Packs/day: 0.25    Years: 50.00    Additional pack years: 0.00    Total pack years: 12.50    Types: Cigarettes    Quit date: 09/06/2020    Years since quitting: 1.6   Smokeless tobacco: Never  Vaping Use   Vaping Use: Never used  Substance and Sexual Activity   Alcohol use: Yes    Alcohol/week: 0.0 standard drinks of alcohol    Comment: rare   Drug use: No   Sexual activity: Not Currently    Birth control/protection: Post-menopausal

## 2022-04-22 NOTE — Progress Notes (Signed)
In excruciating pain in right knee Doesn't feel like injection has done anything

## 2022-04-24 ENCOUNTER — Telehealth: Payer: Self-pay | Admitting: Sports Medicine

## 2022-04-24 NOTE — Telephone Encounter (Signed)
Patient states they need to speak to nurse about the MRI

## 2022-04-24 NOTE — Telephone Encounter (Signed)
Spoke with husband. Gave him the information to [facility] They will call to make apt.

## 2022-04-25 ENCOUNTER — Ambulatory Visit
Admission: RE | Admit: 2022-04-25 | Discharge: 2022-04-25 | Disposition: A | Discharge status: Home / Self Care | Payer: HMO | Source: Ambulatory Visit | Attending: Sports Medicine | Admitting: Sports Medicine

## 2022-04-25 DIAGNOSIS — M25561 Pain in right knee: Secondary | ICD-10-CM | POA: Diagnosis not present

## 2022-04-25 DIAGNOSIS — G8929 Other chronic pain: Secondary | ICD-10-CM

## 2022-04-29 ENCOUNTER — Encounter: Payer: Self-pay | Admitting: Sports Medicine

## 2022-04-29 ENCOUNTER — Telehealth: Payer: Self-pay | Admitting: Sports Medicine

## 2022-04-29 NOTE — Telephone Encounter (Signed)
Patient called in requesting a call back about her injection and about getting a brace on her leg and when can she do that. Please advise

## 2022-04-29 NOTE — Telephone Encounter (Signed)
Pt called requesting a call from [facility] about appt. Please call pt at [phone].

## 2022-04-29 NOTE — Telephone Encounter (Signed)
Left VM for patient regarding appointments. She needs either a follow up visit for brace fitting/HEP A separate appointment for injection once approved thru insurance.  Or she can wait to schedule them all in one visit.   30 min.

## 2022-04-29 NOTE — Progress Notes (Signed)
Called "Fraser Din" reviewed her MRI with her.  MRI demonstrates high-grade cartilage loss of the lateral tibiofemoral compartment with significant osteoarthritis on that side, as well as tricompartmental arthritis.  She does have rather significant edema within the lateral tibial plateau, likely the primary source of her pain.  This is thought to be due to the loss of joint space and osteoarthritic change.  She has failed corticosteroid injection.  At this point would like to send off for viscosupplementation.  She has done therapy in the past.  We will bring her in the office to fit her for hinged knee brace to help offload the knee with the use of a cane or walker.  Also will show her formalized PT exercises versus formal therapy at that time.  Will send off for viscosupplementation and notify her when this arrives we can proceed with this injection therapy.  Elba Barman, DO Primary Care Sports Medicine Physician  [facility] - Orthopedics  MR Knee Right w/o contrast CLINICAL DATA:  right knee pain  EXAM: MRI OF THE RIGHT KNEE WITHOUT CONTRAST  TECHNIQUE: Multiplanar, multisequence MR imaging of the knee was performed. No intravenous contrast was administered.  COMPARISON:  Radiograph 04/17/2022  FINDINGS: MENISCI  Medial: There is intrasubstance degeneration with free edge fraying of the medial meniscal body. No definitive medial meniscus tear.  Lateral: Intrasubstance degeneration with free edge fraying of the posterior horn and body. No definitive lateral meniscus tear.  LIGAMENTS  Cruciates: ACL and PCL are intact.  Collaterals: Medial collateral ligament is intact. Lateral collateral ligament complex is intact.  CARTILAGE  Patellofemoral:  Mild chondrosis.  No focal chondral defect.  Medial:  Mild to moderate chondrosis.  No focal chondral defect.  Lateral: Focal high-grade cartilage loss along the lateral tibial plateau, area measuring 4 x 9 mm. Moderate  partial-thickness cartilage loss along the lateral femoral condyle.  JOINT: No joint effusion.  POPLITEAL FOSSA: Moderate size Baker's cyst.  EXTENSOR MECHANISM: Intact quadriceps tendon. Intact patellar tendon.  BONES: There is extensive marrow edema within the lateral tibial plateau and also present to a lesser degree in the posterolateral femoral condyle, favored to related to arthritis. There is no discrete fracture line identified. There is no aggressive osseous lesion. There is tricompartment osteophyte formation.  Other: Soft tissue swelling along the knee most prominent laterally. No focal fluid collection. Ganglion cyst formation near the semimembranosus insertion on the tibia.  IMPRESSION: Tricompartment osteoarthritis, worst in the lateral compartment with focal high-grade cartilage loss along the lateral tibial plateau, area measuring 4 x 9 mm. Extensive marrow edema within the lateral tibial plateau and also present to a lesser degree in the posterolateral femoral condyle, favored to be related to arthritis. No discrete fracture line identified. Adjacent soft tissue swelling.  Intrasubstance degeneration with free edge fraying of the medial and lateral menisci. No definitive meniscus tear.  Intact cruciate and collateral ligaments.  Moderate size Baker's cyst.  Electronically Signed   By: Maurine Simmering M.D.   On: 04/25/2022 16:21

## 2022-04-30 ENCOUNTER — Other Ambulatory Visit: Payer: Self-pay | Admitting: Sports Medicine

## 2022-04-30 DIAGNOSIS — G8929 Other chronic pain: Secondary | ICD-10-CM

## 2022-04-30 NOTE — Telephone Encounter (Signed)
Attempted to call patient yesterday 04/29/22 at 4:30. No answer so I did LVM.  Attempted to call patient again this morning; no answer.

## 2022-04-30 NOTE — Telephone Encounter (Signed)
Spoke with patient; she is scheduled for apt for brace/HEP Will schedule second apt for injection once approved

## 2022-05-01 ENCOUNTER — Telehealth: Payer: Self-pay

## 2022-05-01 NOTE — Telephone Encounter (Signed)
VOB submitted for Monovisc, right knee  

## 2022-05-05 ENCOUNTER — Other Ambulatory Visit: Payer: Self-pay

## 2022-05-05 DIAGNOSIS — G8929 Other chronic pain: Secondary | ICD-10-CM

## 2022-05-06 ENCOUNTER — Ambulatory Visit (INDEPENDENT_AMBULATORY_CARE_PROVIDER_SITE_OTHER): Payer: HMO | Admitting: Sports Medicine

## 2022-05-06 ENCOUNTER — Ambulatory Visit: Payer: HMO | Admitting: Physical Therapy

## 2022-05-06 ENCOUNTER — Encounter: Payer: Self-pay | Admitting: Sports Medicine

## 2022-05-06 DIAGNOSIS — M47816 Spondylosis without myelopathy or radiculopathy, lumbar region: Secondary | ICD-10-CM | POA: Diagnosis not present

## 2022-05-06 DIAGNOSIS — M810 Age-related osteoporosis without current pathological fracture: Secondary | ICD-10-CM

## 2022-05-06 DIAGNOSIS — R937 Abnormal findings on diagnostic imaging of other parts of musculoskeletal system: Secondary | ICD-10-CM

## 2022-05-06 DIAGNOSIS — Z8781 Personal history of (healed) traumatic fracture: Secondary | ICD-10-CM | POA: Diagnosis not present

## 2022-05-06 DIAGNOSIS — M25561 Pain in right knee: Secondary | ICD-10-CM | POA: Diagnosis not present

## 2022-05-06 DIAGNOSIS — K2270 Barrett's esophagus without dysplasia: Secondary | ICD-10-CM

## 2022-05-06 DIAGNOSIS — M1711 Unilateral primary osteoarthritis, right knee: Secondary | ICD-10-CM | POA: Diagnosis not present

## 2022-05-06 MED ORDER — GABAPENTIN 300 MG PO CAPS: 300.0000 mg | ORAL_CAPSULE | Freq: Three times a day (TID) | ORAL | 1 refills | Status: DC

## 2022-05-06 MED ORDER — BUPIVACAINE HCL 0.25 % IJ SOLN
1.0000 mL | INTRAMUSCULAR | Status: AC | PRN
  Administered 2022-05-06: 1 mL via INTRA_ARTICULAR

## 2022-05-06 MED ORDER — HYALURONAN 88 MG/4ML IX SOSY
88.0000 mg | PREFILLED_SYRINGE | INTRA_ARTICULAR | Status: AC | PRN
  Administered 2022-05-06: 88 mg via INTRA_ARTICULAR

## 2022-05-06 MED ORDER — LIDOCAINE HCL 1 % IJ SOLN
2.0000 mL | INTRAMUSCULAR | Status: AC | PRN
  Administered 2022-05-06: 2 mL

## 2022-05-06 NOTE — Progress Notes (Signed)
Zoe [name] - 87 y.o. female MRN [id number]  Date of birth: 1935/11/19  Office Visit Note: Visit Date: 05/06/2022 PCP: Zoe Specking, MD Referred by: Zoe Specking, MD  Subjective: Chief Complaint  Patient presents with   Right Knee - Follow-up   HPI: Zoe [name] is a pleasant 87 y.o. female who presents today for acute on chronic right knee pain.   Recently underwent MRI of the right knee which showed tricompartmental arthritis however end-stage in the lateral compartment with extensive bone marrow edema within the lateral tibial plateau and femoral condyle.  I do think this is where her pain is emanating from as she has high-grade cartilage loss over this area with swelling in the bone that is causing pain.  She presents with her husband today, Zoe [name]), who states she has had a history of 3 compression vertebral fractures.  Can have these treated elsewhere, which history of kyphoplasty for 2 of them.  Zoe [name] states today that she does not think this really helped her pain.  She does take vitamin D, unclear on calcium.  She is unsure if she has had a DEXA scan.  *Chart review from 03/2020 shows that she had a DEXA scan ordered which does show osteoporosis.  See results below.  Per her chart review he does not feel that she was recommended alendronate, although she never took this given her history of Barrett's esophagus and worried about flaring up her reflux symptoms.  She does not have any monoclonal antibody either.  She would like more information regarding this.   Pertinent ROS were reviewed with the patient and found to be negative unless otherwise specified above in HPI.   Assessment & Plan: Visit Diagnoses:  1. Unilateral primary osteoarthritis, right knee   2. Spondylosis without myelopathy or radiculopathy, lumbar region   3. Age-related osteoporosis without current pathological fracture   4. History of vertebral compression fracture   5. Bone marrow edema     Plan: Discussed with past the nature of her knee pain which is high-grade cartilage loss over the lateral compartment, although she does have tricompartmental arthritis.  She has significant bone marrow edema over the lateral side of the knee joint, treating this essentially as a insufficiency fracture/stress reaction.  Gait proceed with Monovisc injection today, patient tolerated well.  We did fit her for an unloader brace today.  Discussed activity modifications and trying to stay off of the knee is much as possible, recommended nonweightbearing exercise such as stationary bicycle, aquatic therapy.  Gabapentin is one of the few things that is helpful for her pain, she will continue gabapentin 600 mg 3 times daily as needed.  She also has diclofenac 50 mg that she can take as needed.  In terms of this as well as her history of multiple compression fractures with osteoporosis, I would like to send a referral to the sports medicine center (Dr. Pearletha Forge) for her to discuss treatment options such as Prolia or other monoclonal antibodies to help prevention for future compression fractures and osteoporosis associated pathology. She is agreeable to this.  Follow-up: Return in about 5 weeks (around 06/10/2022) for f/u 1 month after starting PT.   Meds & Orders:  Meds ordered this encounter  Medications   gabapentin (NEURONTIN) 300 MG capsule    Sig: Take 1 capsule (300 mg total) by mouth 3 (three) times daily.    Dispense:  180 capsule    Refill:  1    Orders  Placed This Encounter  Procedures   Large Joint Inj   AMB referral to sports medicine     Procedures: Large Joint Inj: R knee on 05/06/2022 1:46 PM Indications: pain Details: 22 G 1.5 in needle, anterolateral approach Medications: 2 mL lidocaine 1 %; 1 mL bupivacaine 0.25 %; 88 mg Hyaluronan 88 MG/4ML Outcome: tolerated well, no immediate complications Procedure, treatment alternatives, risks and benefits explained, specific risks discussed.  Consent was given by the patient. Immediately prior to procedure a time out was called to verify the correct patient, procedure, equipment, support staff and site/side marked as required. Patient was prepped and draped in the usual sterile fashion.        Clinical History: No specialty comments available.  She reports that she quit smoking about 19 months ago. Her smoking use included cigarettes. She has a 12.50 pack-year smoking history. She has never used smokeless tobacco. No results for input(s): "HGBA1C", "LABURIC" in the last 8760 hours.  - Dexa Scan 04/13/20:  AP Spine L2-L4 (L3) 04/13/2020 82.3 Normal -0.4 1.157 g/cm2 0.4% - AP Spine L2-L4 (L3) 11/25/2017 79.9 Normal -0.4 1.152 g/cm2 6.6% Yes AP Spine L2-L4 (L3) 11/28/2009 71.9 Normal -1.0 1.081 g/cm2 - -   Right Forearm Radius 33% 04/13/2020 82.3 Osteoporosis -2.5 0.538 g/cm2 -3.0% - Right Forearm Radius 33% 11/25/2017 79.9 Osteopenia -2.2 0.555 g/cm2 - -   ASSESSMENT: The BMD measured at Forearm Radius 33% is 0.538 g/cm2 with a T-score of -2.5.   This patient is considered OSTEOPOROTIC according to World Health Organization [facility]) criteria.   The scan quality is good.   Compared with the prior study on 11/25/2017, the BMD of the lumbar spine and rt. forearm show no statistically significant change.   L1 and L3 were excluded due to advanced degenerative changes. Bilateral hips excluded due to surgical repair.   World Science writer [facility]) criteria for post-menopausal, Caucasian Women: Normal:       T-score at or above -1 SD Osteopenia:   T-score between -1 and -2.5 SD Osteoporosis: T-score at or below -2.5 SD   RECOMMENDATIONS: 1. All patients should optimize calcium and vitamin D intake. 2. Consider FDA-approved medical therapies in postmenopausal women and med aged 61 years and older, based on the following: a. A hip or vertebral (clinical or morphometric) fracture b. T-score < -2.5 at the femoral neck or  spine after appropriate evaluation to exclude secondary causes c. Low bone mass (T-score between -1.0 and -2.5 at the femoral neck or spine) and a 10-year probability of a hip fracture > 3% or a 10-year probability of a major osteoporosis-related fracture > 20% based on the [place]-adapted WHO algorithm d. Clinician judgment and/or patient preferences may indicate treatment for people with 10-year fracture probabilities above or below these levels   Objective:    Physical Exam  Gen: Well-appearing, in no acute distress; non-toxic CV: Well-perfused. Warm.  Resp: Breathing unlabored on room air; no wheezing. Psych: Fluid speech in conversation; appropriate affect; normal thought process Neuro: Sensation intact throughout. No gross coordination deficits.   Ortho Exam - Right knee: No gross effusion or redness.  There is positive TTP over the lateral joint line as well as the lateral tibial plateau more specifically.  Negative Tinel's at the fibular head.  Range of motion from 0-125 degrees.  There is pain with McMurray's over the lateral joint line without reproducible clicking.  There is an antalgic gait. NVI.  Imaging:  MR Knee Right w/o contrast CLINICAL DATA:  right  knee pain  EXAM: MRI OF THE RIGHT KNEE WITHOUT CONTRAST  TECHNIQUE: Multiplanar, multisequence MR imaging of the knee was performed. No intravenous contrast was administered.  COMPARISON:  Radiograph 04/17/2022  FINDINGS: MENISCI  Medial: There is intrasubstance degeneration with free edge fraying of the medial meniscal body. No definitive medial meniscus tear.  Lateral: Intrasubstance degeneration with free edge fraying of the posterior horn and body. No definitive lateral meniscus tear.  LIGAMENTS  Cruciates: ACL and PCL are intact.  Collaterals: Medial collateral ligament is intact. Lateral collateral ligament complex is intact.  CARTILAGE  Patellofemoral:  Mild chondrosis.  No focal chondral  defect.  Medial:  Mild to moderate chondrosis.  No focal chondral defect.  Lateral: Focal high-grade cartilage loss along the lateral tibial plateau, area measuring 4 x 9 mm. Moderate partial-thickness cartilage loss along the lateral femoral condyle.  JOINT: No joint effusion.  POPLITEAL FOSSA: Moderate size Baker's cyst.  EXTENSOR MECHANISM: Intact quadriceps tendon. Intact patellar tendon.  BONES: There is extensive marrow edema within the lateral tibial plateau and also present to a lesser degree in the posterolateral femoral condyle, favored to related to arthritis. There is no discrete fracture line identified. There is no aggressive osseous lesion. There is tricompartment osteophyte formation.  Other: Soft tissue swelling along the knee most prominent laterally. No focal fluid collection. Ganglion cyst formation near the semimembranosus insertion on the tibia.  IMPRESSION: Tricompartment osteoarthritis, worst in the lateral compartment with focal high-grade cartilage loss along the lateral tibial plateau, area measuring 4 x 9 mm. Extensive marrow edema within the lateral tibial plateau and also present to a lesser degree in the posterolateral femoral condyle, favored to be related to arthritis. No discrete fracture line identified. Adjacent soft tissue swelling.  Intrasubstance degeneration with free edge fraying of the medial and lateral menisci. No definitive meniscus tear.  Intact cruciate and collateral ligaments.  Moderate size Baker's cyst.  Electronically Signed   By: Caprice Renshaw M.D.   On: 04/25/2022 16:21    Past Medical/Family/Surgical/Social History: Medications & Allergies reviewed per EMR, new medications updated. Patient Active Problem List   Diagnosis Date Noted   Aspiration into airway 11/28/2021   IBS (irritable bowel syndrome) 11/28/2021   SSBE (short-segment Barrett's esophagus) 06/25/2021   Lip pain 11/01/2020   Claudication 08/02/2020    Hearing loss, right 07/16/2020   Seasonal allergies 07/04/2020   Insomnia 07/04/2020   Osteoporosis 07/04/2020   Lumbosacral spondylosis without myelopathy 06/01/2020   Polyarthritis 04/03/2020   Nausea 04/03/2020   Leg swelling 01/31/2020   Chronic diastolic CHF (congestive heart failure) 01/31/2020   Abdominal pain 01/10/2020   Esophageal dysphagia 06/21/2019   Stenosis of lateral recess of lumbar spine 12/06/2018   Foraminal stenosis of lumbar region 12/06/2018   Spondylosis without myelopathy or radiculopathy, lumbar region 12/06/2018   Other idiopathic scoliosis, thoracolumbar region 12/06/2018   Chronic left shoulder pain 12/06/2018   History of colonic polyps 10/22/2016   SBO (small bowel obstruction) 03/02/2013   Hyperlipidemia 12/08/2012   Gout 12/08/2012   Dysrhythmia 12/08/2012   Depression 12/08/2012   Asthma 12/08/2012   Fibromyalgia 12/08/2012   Constipation 12/08/2012   Acute posthemorrhagic anemia 12/07/2012   Benign essential hypertension 12/07/2012   Degenerative arthritis of hip, left 11/05/2012   Weakness of right leg 03/24/2011   OA (osteoarthritis) 03/24/2011   Stiffness of cervical spine 03/24/2011   GERD (gastroesophageal reflux disease) 11/19/2010   Spinal stenosis 04/18/2009   HIP PAIN 11/17/2007   DDD (degenerative  disc disease), lumbosacral 11/17/2007   ANSERINE BURSITIS, RIGHT 08/11/2007   Sciatica 06/22/2007   Knee pain 03/17/2007   Past Medical History:  Diagnosis Date   Abdominal discomfort 11/02/2012   suspected ?UTI- PCP MD placed on Cipro   Anxiety    Arthritis    Asthma    Cancer (HCC)    skin CA   Chronic constipation    Depression    Dysrhythmia    tachycardia   Environmental allergies    Fibromyalgia    GERD (gastroesophageal reflux disease)    Hemorrhoids    Hypercholesteremia    Hypertension    Sleep apnea    Family History  Problem Relation Age of Onset   Healthy Son    Healthy Son    Healthy Daughter     Colon cancer Neg Hx    Past Surgical History:  Procedure Laterality Date   ANAL RECTAL MANOMETRY N/A 11/28/2013   Procedure: ANO RECTAL MANOMETRY;  Surgeon: Romie Levee, MD;  Location: [facility];  Service: Endoscopy;  Laterality: N/A;   APPENDECTOMY     BACK SURGERY     X2    BIOPSY  03/13/2021   Procedure: BIOPSY;  Surgeon: Malissa Hippo, MD;  Location: [facility];  Service: Endoscopy;;   CHOLECYSTECTOMY     COLONOSCOPY  03/29/03   COLONOSCOPY N/A 12/09/2013   Procedure: COLONOSCOPY;  Surgeon: Malissa Hippo, MD;  Location: [facility];  Service: Endoscopy;  Laterality: N/A;  730   COLONOSCOPY N/A 02/04/2017   Procedure: COLONOSCOPY;  Surgeon: Malissa Hippo, MD;  Location: [facility];  Service: Endoscopy;  Laterality: N/A;  930   ESOPHAGEAL DILATION N/A 03/13/2021   Procedure: ESOPHAGEAL DILATION;  Surgeon: Malissa Hippo, MD;  Location: [facility];  Service: Endoscopy;  Laterality: N/A;   ESOPHAGOGASTRODUODENOSCOPY (EGD) WITH PROPOFOL N/A 03/13/2021   Procedure: ESOPHAGOGASTRODUODENOSCOPY (EGD) WITH PROPOFOL;  Surgeon: Malissa Hippo, MD;  Location: [facility];  Service: Endoscopy;  Laterality: N/A;  8:05   EYE SURGERY     bilateral cataract removal   IR KYPHO LUMBAR INC FX REDUCE BONE BX UNI/BIL CANNULATION INC/IMAGING  11/24/2019   IR KYPHO THORACIC WITH BONE BIOPSY  08/23/2019   IR KYPHO THORACIC WITH BONE BIOPSY  11/24/2019   IR RADIOLOGIST EVAL & MGMT  08/03/2019   IR RADIOLOGIST EVAL & MGMT  09/29/2019   IR RADIOLOGIST EVAL & MGMT  10/18/2019   IR RADIOLOGIST EVAL & MGMT  04/11/2020   SKIN CANCER EXCISION     RIGHT NECK     TONSILLECTOMY     T+A   TOTAL HIP ARTHROPLASTY Right 09/14/2012   Procedure: RIGHT TOTAL HIP ARTHROPLASTY ANTERIOR APPROACH and RIGHT KNEE STEROID INJECTION;  Surgeon: Kathryne Hitch, MD;  Location: [facility];  Service: Orthopedics;  Laterality: Right;   TOTAL HIP ARTHROPLASTY Left 11/05/2012   Procedure: LEFT TOTAL HIP ARTHROPLASTY  ANTERIOR APPROACH;  Surgeon: Kathryne Hitch, MD;  Location: [facility];  Service: Orthopedics;  Laterality: Left;   UMBILICAL HERNIA REPAIR  04/26/03   JENKINS   UPPER GASTROINTESTINAL ENDOSCOPY  08/03/2009   NUR   UPPER GASTROINTESTINAL ENDOSCOPY  03/29/2003   EGD ED TCS   Social History   Occupational History   Not on file  Tobacco Use   Smoking status: Former    Packs/day: 0.25    Years: 50.00    Additional pack years: 0.00    Total pack years: 12.50  Types: Cigarettes    Quit date: 09/06/2020    Years since quitting: 1.6   Smokeless tobacco: Never  Vaping Use   Vaping Use: Never used  Substance and Sexual Activity   Alcohol use: Yes    Alcohol/week: 0.0 standard drinks of alcohol    Comment: rare   Drug use: No   Sexual activity: Not Currently    Birth control/protection: Post-menopausal

## 2022-05-12 ENCOUNTER — Encounter: Payer: Self-pay | Admitting: Physical Therapy

## 2022-05-12 ENCOUNTER — Other Ambulatory Visit: Payer: Self-pay

## 2022-05-12 ENCOUNTER — Ambulatory Visit: Payer: HMO | Admitting: Rehabilitative and Restorative Service Providers"

## 2022-05-12 ENCOUNTER — Ambulatory Visit (INDEPENDENT_AMBULATORY_CARE_PROVIDER_SITE_OTHER): Payer: HMO | Admitting: Physical Therapy

## 2022-05-12 DIAGNOSIS — G8929 Other chronic pain: Secondary | ICD-10-CM | POA: Diagnosis not present

## 2022-05-12 DIAGNOSIS — M25661 Stiffness of right knee, not elsewhere classified: Secondary | ICD-10-CM | POA: Diagnosis not present

## 2022-05-12 DIAGNOSIS — M25561 Pain in right knee: Secondary | ICD-10-CM

## 2022-05-12 DIAGNOSIS — M6281 Muscle weakness (generalized): Secondary | ICD-10-CM

## 2022-05-12 NOTE — Therapy (Signed)
OUTPATIENT PHYSICAL THERAPY LOWER EXTREMITY EVALUATION   Patient Name: Zoe [name] MRN: [id number] DOB:01/20/1936, 87 y.o., female Today's Date: 05/12/2022  END OF SESSION:  PT End of Session - 05/12/22 1309     Visit Number 1    Number of Visits 20    Date for PT Re-Evaluation 07/25/22    Progress Note Due on Visit 10    PT Start Time 1300    PT Stop Time 1340    PT Time Calculation (min) 40 min    Activity Tolerance Patient tolerated treatment well    Behavior During Therapy [facility] for tasks assessed/performed             Past Medical History:  Diagnosis Date   Abdominal discomfort 11/02/2012   suspected ?UTI- PCP MD placed on Cipro   Anxiety    Arthritis    Asthma    Cancer    skin CA   Chronic constipation    Depression    Dysrhythmia    tachycardia   Environmental allergies    Fibromyalgia    GERD (gastroesophageal reflux disease)    Hemorrhoids    Hypercholesteremia    Hypertension    Sleep apnea    Past Surgical History:  Procedure Laterality Date   ANAL RECTAL MANOMETRY N/A 11/28/2013   Procedure: ANO RECTAL MANOMETRY;  Surgeon: Romie Levee, MD;  Location: [facility];  Service: Endoscopy;  Laterality: N/A;   APPENDECTOMY     BACK SURGERY     X2    BIOPSY  03/13/2021   Procedure: BIOPSY;  Surgeon: Malissa Hippo, MD;  Location: [facility];  Service: Endoscopy;;   CHOLECYSTECTOMY     COLONOSCOPY  03/29/03   COLONOSCOPY N/A 12/09/2013   Procedure: COLONOSCOPY;  Surgeon: Malissa Hippo, MD;  Location: [facility];  Service: Endoscopy;  Laterality: N/A;  730   COLONOSCOPY N/A 02/04/2017   Procedure: COLONOSCOPY;  Surgeon: Malissa Hippo, MD;  Location: [facility];  Service: Endoscopy;  Laterality: N/A;  930   ESOPHAGEAL DILATION N/A 03/13/2021   Procedure: ESOPHAGEAL DILATION;  Surgeon: Malissa Hippo, MD;  [facility]: [facility];  Service: Endoscopy;  Laterality: N/A;   ESOPHAGOGASTRODUODENOSCOPY (EGD) WITH PROPOFOL N/A 03/13/2021    Procedure: ESOPHAGOGASTRODUODENOSCOPY (EGD) WITH PROPOFOL;  Surgeon: Malissa Hippo, MD;  Location: [facility];  Service: Endoscopy;  Laterality: N/A;  8:05   EYE SURGERY     bilateral cataract removal   IR KYPHO LUMBAR INC FX REDUCE BONE BX UNI/BIL CANNULATION INC/IMAGING  11/24/2019   IR KYPHO THORACIC WITH BONE BIOPSY  08/23/2019   IR KYPHO THORACIC WITH BONE BIOPSY  11/24/2019   IR RADIOLOGIST EVAL & MGMT  08/03/2019   IR RADIOLOGIST EVAL & MGMT  09/29/2019   IR RADIOLOGIST EVAL & MGMT  10/18/2019   IR RADIOLOGIST EVAL & MGMT  04/11/2020   SKIN CANCER EXCISION     RIGHT NECK     TONSILLECTOMY     T+A   TOTAL HIP ARTHROPLASTY Right 09/14/2012   Procedure: RIGHT TOTAL HIP ARTHROPLASTY ANTERIOR APPROACH and RIGHT KNEE STEROID INJECTION;  Surgeon: Kathryne Hitch, MD;  Location: [facility] OR;  Service: Orthopedics;  Laterality: Right;   TOTAL HIP ARTHROPLASTY Left 11/05/2012   Procedure: LEFT TOTAL HIP ARTHROPLASTY ANTERIOR APPROACH;  Surgeon: Kathryne Hitch, MD;  Location: [facility];  Service: Orthopedics;  Laterality: Left;   UMBILICAL HERNIA REPAIR  04/26/03   JENKINS   UPPER GASTROINTESTINAL ENDOSCOPY  08/03/2009   NUR   UPPER GASTROINTESTINAL ENDOSCOPY  03/29/2003   EGD ED TCS   Patient Active Problem List   Diagnosis Date Noted   Aspiration into airway 11/28/2021   IBS (irritable bowel syndrome) 11/28/2021   SSBE (short-segment Barrett's esophagus) 06/25/2021   Lip pain 11/01/2020   Claudication 08/02/2020   Hearing loss, right 07/16/2020   Seasonal allergies 07/04/2020   Insomnia 07/04/2020   Osteoporosis 07/04/2020   Lumbosacral spondylosis without myelopathy 06/01/2020   Polyarthritis 04/03/2020   Nausea 04/03/2020   Leg swelling 01/31/2020   Chronic diastolic CHF (congestive heart failure) 01/31/2020   Abdominal pain 01/10/2020   Esophageal dysphagia 06/21/2019   Stenosis of lateral recess of lumbar spine 12/06/2018   Foraminal stenosis of lumbar region  12/06/2018   Spondylosis without myelopathy or radiculopathy, lumbar region 12/06/2018   Other idiopathic scoliosis, thoracolumbar region 12/06/2018   Chronic left shoulder pain 12/06/2018   History of colonic polyps 10/22/2016   SBO (small bowel obstruction) 03/02/2013   Hyperlipidemia 12/08/2012   Gout 12/08/2012   Dysrhythmia 12/08/2012   Depression 12/08/2012   Asthma 12/08/2012   Fibromyalgia 12/08/2012   Constipation 12/08/2012   Acute posthemorrhagic anemia 12/07/2012   Benign essential hypertension 12/07/2012   Degenerative arthritis of hip, left 11/05/2012   Weakness of right leg 03/24/2011   OA (osteoarthritis) 03/24/2011   Stiffness of cervical spine 03/24/2011   GERD (gastroesophageal reflux disease) 11/19/2010   Spinal stenosis 04/18/2009   HIP PAIN 11/17/2007   DDD (degenerative disc disease), lumbosacral 11/17/2007   ANSERINE BURSITIS, RIGHT 08/11/2007   Sciatica 06/22/2007   Knee pain 03/17/2007    PCP: Ignatius Specking, MD   REFERRING PROVIDER:  Madelyn Brunner, DO   REFERRING DIAG: [phone] (ICD-10-CM) - Chronic pain of right knee  THERAPY DIAG:  Chronic pain of right knee  Muscle weakness (generalized)  Stiffness of right knee, not elsewhere classified  Rationale for Evaluation and Treatment: Rehabilitation  ONSET DATE: progressively worse over the last several months  SUBJECTIVE:   SUBJECTIVE STATEMENT: Pt arriving today reporting 4-5/10 pain in her Rt knee after taking 2 tylenol and 2 gabapentin. Pt stating her pain has been on-going for a while.     Per MD note c Dr. Shon Baton:  Recently underwent MRI of the right knee which showed tricompartmental arthritis however end-stage in the lateral compartment with extensive bone marrow edema within the lateral tibial plateau and femoral condyle.  I do think this is where her pain is emanating from as she has high-grade cartilage loss over this area with swelling in the bone that is causing pain.    PERTINENT HISTORY: Anxiety, asthma, arthritis, CA, depression, fibromyalgia, GERD, HTN, back surgery, appendectomy, eye surgery, skin CA excision, THA Rt, THA left PAIN:  NPRS scale: 4-5/10 Pain location: Rt knee Pain description: achy, throbbing Aggravating factors: weight bearing, walking, standing Relieving factors: sitting, pain meds  PRECAUTIONS: None  WEIGHT BEARING RESTRICTIONS: No  FALLS:  Has patient fallen in last 6 months? No  LIVING ENVIRONMENT: Lives with: lives with their family and lives with their spouse Lives in: House/apartment Stairs: Yes: External: 6 steps; can reach both Has following equipment at home: Single point cane and Walker - 2 wheeled  OCCUPATION: retired  PLOF: Independent  PATIENT GOALS: walk better without pain  Next MD visit: f/u not currently scheduled   OBJECTIVE:   DIAGNOSTIC FINDINGS:  IMPRESSION: 04/25/22 Tricompartment osteoarthritis, worst in the lateral compartment with focal high-grade cartilage loss along the  lateral tibial plateau, area measuring 4 x 9 mm. Extensive marrow edema within the lateral tibial plateau and also present to a lesser degree in the posterolateral femoral condyle, favored to be related to arthritis. No discrete fracture line identified. Adjacent soft tissue swelling.   Intrasubstance degeneration with free edge fraying of the medial and lateral menisci. No definitive meniscus tear.   Intact cruciate and collateral ligaments.   Moderate size Baker's cyst.    PATIENT SURVEYS:  05/12/22: FOTO intake:  25%  predicted:  43%  COGNITION: 05/12/22 Overall cognitive status: WFL    SENSATION: WFL  EDEMA:  05/12/22 Rt Knee: 45 centimeters              Left Knee: 43 centimeters  MUSCLE LENGTH: 05/12/22 Hamstrings: Right 68 deg; Left 75 deg   POSTURE:05/12/22 rounded shoulders, forward head, and decreased lumbar lordosis  PALPATION: 05/12/22 TTP: distal patella tendon, lateral posterior knee, and  hamstring tendons  LOWER EXTREMITY ROM:   ROM Right 05/12/22 supine Left 05/12/22 supine  Hip flexion    Hip extension    Hip abduction    Hip adduction    Hip internal rotation    Hip external rotation    Knee flexion A: 135 with pain at end ranges A: 135   Knee extension A: 0 A: 0   (Blank rows = not tested)  LOWER EXTREMITY MMT:  MMT Right 05/12/22 Left 05/12/22  Hip flexion 4 4+  Hip extension 4 4+  Hip abduction 4 4+  Hip adduction 4 4+  Hip internal rotation    Hip external rotation    Knee flexion 4 5  Knee extension 4 5   (Blank rows = not tested)    FUNCTIONAL TESTS:  05/12/22: 5 time sit to stand:  26 seconds No UE support  GAIT: 05/12/22 Distance walked: 30 feet level surface Assistive device utilized: Single point cane Level of assistance: Modified independence Comments: valgus knee on Rt, bilateral pes planus   TODAY'S TREATMENT                                                                          DATE:  05/12/22:  Therex: HEP instruction/performance c cues for techniques, handout provided.  Trial set performed of each for comprehension and symptom assessment.  See below for exercise list  PATIENT EDUCATION:  Education details: HEP, POC Person educated: Patient Education method: Explanation, Demonstration, Verbal cues, and Handouts Education comprehension: verbalized understanding, returned demonstration, and verbal cues required  HOME EXERCISE PROGRAM: Access Code: [email or web address] URL: [email or web address] Date: 05/12/2022 Prepared by: Narda Amber  Exercises - Supine Bridge  - 2 x daily - 7 x weekly - 10 reps - 5 seconds hold - Supine Active Straight Leg Raise  - 2 x daily - 7 x weekly - 10 reps - Supine Quad Set  - 2 x daily - 7 x weekly - 2 sets - 10 reps - 5 seconds hold - Hooklying Clamshell with Resistance  - 2 x daily - 7 x weekly - 2 sets - 10 reps - 5 seconds hold  ASSESSMENT:  CLINICAL IMPRESSION: Patient is a  87 y.o. who comes to clinic with complaints of chronic Rt knee pain. Pt  dx with tricompartment OA which is worse on the lateral side with loss of cartilage along tibial plateau. Pt also with moderate size Baker's cyst  on the Rt. Pt presents with mobility, strength and movement coordination deficits that impair their ability to perform usual daily and recreational functional activities without increase difficulty/symptoms at this time.  Patient to benefit from skilled PT services to address impairments and limitations to improve to previous level of function without restriction secondary to condition.   OBJECTIVE IMPAIRMENTS: decreased activity tolerance, decreased balance, decreased mobility, difficulty walking, decreased ROM, increased edema, and pain.   ACTIVITY LIMITATIONS: standing, squatting, sleeping, stairs, and transfers  PARTICIPATION LIMITATIONS: cleaning and community activity  PERSONAL FACTORS: 3+ comorbidities: see pertinent history above  are also affecting patient's functional outcome.   REHAB POTENTIAL: Fair based on age and other co-morbidities  CLINICAL DECISION MAKING: Stable/uncomplicated  EVALUATION COMPLEXITY: Low   GOALS: Goals reviewed with patient? Yes  SHORT TERM GOALS: (target date for Short term goals are 3 weeks 06/06/22)   1.  Patient will demonstrate independent use of home exercise program to maintain progress from in clinic treatments.  Goal status: New  LONG TERM GOALS: (target dates for all long term goals are 10 weeks  07/25/22 )   1. Patient will demonstrate/report pain at worst less than or equal to 2/10 to facilitate minimal limitation in daily activity secondary to pain symptoms.  Goal status: New   2. Patient will demonstrate independent use of home exercise program to facilitate ability to maintain/progress functional gains from skilled physical therapy services.  Goal status: New   3. Patient will demonstrate FOTO outcome > or = 43 % to  indicate reduced disability due to condition.  Goal status: New   4.  Patient will demonstrate right LE MMT 5/5 throughout to faciltiate usual transfers, stairs, squatting at [facility] for daily life.   Goal status: New   5.  Patient will be able to amb community distances with normalized gait pattern with pain </= 2/10.  Goal status: New   6.  Pt will improve her 5 time sit to stand to </= 16 seconds to improve functional mobility.  Goal status: New  7. Pt will be able to navigate 6 steps with single hand rail with pain </= 2/10 in Rt knee.   Goal Status: New      PLAN:  PT FREQUENCY: 1-2x/week  PT DURATION: 10 weeks  PLANNED INTERVENTIONS: Therapeutic exercises, Therapeutic activity, Neuro Muscular re-education, Balance training, Gait training, Patient/Family education, Joint mobilization, Stair training, DME instructions, Dry Needling, Electrical stimulation, Traction, Cryotherapy, vasopneumatic deviceMoist heat, Taping, Ultrasound, Ionotophoresis /ml Dexamethasone, and aquatic therapy, Manual therapy.  All included unless contraindicated  PLAN FOR NEXT SESSION: Review HEP knowledge/results, knee ROM, strengthening, Nustep         Sharmon Leyden, PT, MPT 05/12/2022, 1:12 PM

## 2022-05-13 ENCOUNTER — Encounter: Payer: Self-pay | Admitting: Sports Medicine

## 2022-05-19 ENCOUNTER — Ambulatory Visit (INDEPENDENT_AMBULATORY_CARE_PROVIDER_SITE_OTHER): Payer: HMO | Admitting: Physical Therapy

## 2022-05-19 ENCOUNTER — Encounter: Payer: Self-pay | Admitting: Physical Therapy

## 2022-05-19 DIAGNOSIS — M6281 Muscle weakness (generalized): Secondary | ICD-10-CM | POA: Diagnosis not present

## 2022-05-19 DIAGNOSIS — M25661 Stiffness of right knee, not elsewhere classified: Secondary | ICD-10-CM

## 2022-05-19 DIAGNOSIS — M25561 Pain in right knee: Secondary | ICD-10-CM | POA: Diagnosis not present

## 2022-05-19 DIAGNOSIS — G8929 Other chronic pain: Secondary | ICD-10-CM

## 2022-05-19 NOTE — Therapy (Signed)
OUTPATIENT PHYSICAL THERAPY LOWER EXTREMITY  TREATMENT NOTE   Patient Name: Zoe [name] MRN: [id number] DOB:Mar 10, 1935, 87 y.o., female Today's Date: 05/19/2022  END OF SESSION:  PT End of Session - 05/19/22 1118     Visit Number 2    Number of Visits 20    Date for PT Re-Evaluation 07/25/22    Progress Note Due on Visit 10    PT Start Time 1104    PT Stop Time 1145    PT Time Calculation (min) 41 min    Activity Tolerance Patient tolerated treatment well    Behavior During Therapy [facility] for tasks assessed/performed             Past Medical History:  Diagnosis Date   Abdominal discomfort 11/02/2012   suspected ?UTI- PCP MD placed on Cipro   Anxiety    Arthritis    Asthma    Cancer    skin CA   Chronic constipation    Depression    Dysrhythmia    tachycardia   Environmental allergies    Fibromyalgia    GERD (gastroesophageal reflux disease)    Hemorrhoids    Hypercholesteremia    Hypertension    Sleep apnea    Past Surgical History:  Procedure Laterality Date   ANAL RECTAL MANOMETRY N/A 11/28/2013   Procedure: ANO RECTAL MANOMETRY;  Surgeon: Romie Levee, MD;  Location: [facility];  Service: Endoscopy;  Laterality: N/A;   APPENDECTOMY     BACK SURGERY     X2    BIOPSY  03/13/2021   Procedure: BIOPSY;  Surgeon: Malissa Hippo, MD;  Location: [facility];  Service: Endoscopy;;   CHOLECYSTECTOMY     COLONOSCOPY  03/29/03   COLONOSCOPY N/A 12/09/2013   Procedure: COLONOSCOPY;  Surgeon: Malissa Hippo, MD;  Location: [facility];  Service: Endoscopy;  Laterality: N/A;  730   COLONOSCOPY N/A 02/04/2017   Procedure: COLONOSCOPY;  Surgeon: Malissa Hippo, MD;  [facility]: [facility];  Service: Endoscopy;  Laterality: N/A;  930   ESOPHAGEAL DILATION N/A 03/13/2021   Procedure: ESOPHAGEAL DILATION;  Surgeon: Malissa Hippo, MD;  [facility]: [facility];  Service: Endoscopy;  Laterality: N/A;   ESOPHAGOGASTRODUODENOSCOPY (EGD) WITH PROPOFOL N/A  03/13/2021   Procedure: ESOPHAGOGASTRODUODENOSCOPY (EGD) WITH PROPOFOL;  Surgeon: Malissa Hippo, MD;  Location: [facility];  Service: Endoscopy;  Laterality: N/A;  8:05   EYE SURGERY     bilateral cataract removal   IR KYPHO LUMBAR INC FX REDUCE BONE BX UNI/BIL CANNULATION INC/IMAGING  11/24/2019   IR KYPHO THORACIC WITH BONE BIOPSY  08/23/2019   IR KYPHO THORACIC WITH BONE BIOPSY  11/24/2019   IR RADIOLOGIST EVAL & MGMT  08/03/2019   IR RADIOLOGIST EVAL & MGMT  09/29/2019   IR RADIOLOGIST EVAL & MGMT  10/18/2019   IR RADIOLOGIST EVAL & MGMT  04/11/2020   SKIN CANCER EXCISION     RIGHT NECK     TONSILLECTOMY     T+A   TOTAL HIP ARTHROPLASTY Right 09/14/2012   Procedure: RIGHT TOTAL HIP ARTHROPLASTY ANTERIOR APPROACH and RIGHT KNEE STEROID INJECTION;  Surgeon: Kathryne Hitch, MD;  Location: [facility];  Service: Orthopedics;  Laterality: Right;   TOTAL HIP ARTHROPLASTY Left 11/05/2012   Procedure: LEFT TOTAL HIP ARTHROPLASTY ANTERIOR APPROACH;  Surgeon: Kathryne Hitch, MD;  Location: [facility];  Service: Orthopedics;  Laterality: Left;   UMBILICAL HERNIA REPAIR  04/26/03   JENKINS   UPPER GASTROINTESTINAL  ENDOSCOPY  08/03/2009   NUR   UPPER GASTROINTESTINAL ENDOSCOPY  03/29/2003   EGD ED TCS   Patient Active Problem List   Diagnosis Date Noted   Aspiration into airway 11/28/2021   IBS (irritable bowel syndrome) 11/28/2021   SSBE (short-segment Barrett's esophagus) 06/25/2021   Lip pain 11/01/2020   Claudication 08/02/2020   Hearing loss, right 07/16/2020   Seasonal allergies 07/04/2020   Insomnia 07/04/2020   Osteoporosis 07/04/2020   Lumbosacral spondylosis without myelopathy 06/01/2020   Polyarthritis 04/03/2020   Nausea 04/03/2020   Leg swelling 01/31/2020   Chronic diastolic CHF (congestive heart failure) 01/31/2020   Abdominal pain 01/10/2020   Esophageal dysphagia 06/21/2019   Stenosis of lateral recess of lumbar spine 12/06/2018   Foraminal stenosis of lumbar  region 12/06/2018   Spondylosis without myelopathy or radiculopathy, lumbar region 12/06/2018   Other idiopathic scoliosis, thoracolumbar region 12/06/2018   Chronic left shoulder pain 12/06/2018   History of colonic polyps 10/22/2016   SBO (small bowel obstruction) 03/02/2013   Hyperlipidemia 12/08/2012   Gout 12/08/2012   Dysrhythmia 12/08/2012   Depression 12/08/2012   Asthma 12/08/2012   Fibromyalgia 12/08/2012   Constipation 12/08/2012   Acute posthemorrhagic anemia 12/07/2012   Benign essential hypertension 12/07/2012   Degenerative arthritis of hip, left 11/05/2012   Weakness of right leg 03/24/2011   OA (osteoarthritis) 03/24/2011   Stiffness of cervical spine 03/24/2011   GERD (gastroesophageal reflux disease) 11/19/2010   Spinal stenosis 04/18/2009   HIP PAIN 11/17/2007   DDD (degenerative disc disease), lumbosacral 11/17/2007   ANSERINE BURSITIS, RIGHT 08/11/2007   Sciatica 06/22/2007   Knee pain 03/17/2007    PCP: Ignatius Specking, MD   REFERRING PROVIDER:  Madelyn Brunner, DO   REFERRING DIAG: [phone] (ICD-10-CM) - Chronic pain of right knee  THERAPY DIAG:  Chronic pain of right knee  Muscle weakness (generalized)  Stiffness of right knee, not elsewhere classified  Rationale for Evaluation and Treatment: Rehabilitation  ONSET DATE: progressively worse over the last several months  SUBJECTIVE:   SUBJECTIVE STATEMENT: Pt arriving today reporting 8/10 first thing in the morning. Pt reporting 4-5/10 upon arrival. Pt stating she has taken 2 gabapentin and 2 tylenol.     Per MD note c Dr. Shon Baton:  Recently underwent MRI of the right knee which showed tricompartmental arthritis however end-stage in the lateral compartment with extensive bone marrow edema within the lateral tibial plateau and femoral condyle.  I do think this is where her pain is emanating from as she has high-grade cartilage loss over this area with swelling in the bone that is causing  pain.   PERTINENT HISTORY: Anxiety, asthma, arthritis, CA, depression, fibromyalgia, GERD, HTN, back surgery, appendectomy, eye surgery, skin CA excision, THA Rt, THA left PAIN:  NPRS scale: 4-5/10 Pain location: Rt knee Pain description: achy, throbbing Aggravating factors: weight bearing, walking, standing Relieving factors: sitting, pain meds  PRECAUTIONS: None  WEIGHT BEARING RESTRICTIONS: No  FALLS:  Has patient fallen in last 6 months? No  LIVING ENVIRONMENT: Lives with: lives with their family and lives with their spouse Lives in: House/apartment Stairs: Yes: External: 6 steps; can reach both Has following equipment at home: Single point cane and Walker - 2 wheeled  OCCUPATION: retired  PLOF: Independent  PATIENT GOALS: walk better without pain  Next MD visit: f/u not currently scheduled   OBJECTIVE:   DIAGNOSTIC FINDINGS:  IMPRESSION: 04/25/22 Tricompartment osteoarthritis, worst in the lateral compartment with focal high-grade cartilage loss along the  lateral tibial plateau, area measuring 4 x 9 mm. Extensive marrow edema within the lateral tibial plateau and also present to a lesser degree in the posterolateral femoral condyle, favored to be related to arthritis. No discrete fracture line identified. Adjacent soft tissue swelling.   Intrasubstance degeneration with free edge fraying of the medial and lateral menisci. No definitive meniscus tear.   Intact cruciate and collateral ligaments.   Moderate size Baker's cyst.    PATIENT SURVEYS:  05/12/22: FOTO intake:  25%  predicted:  43%  COGNITION: 05/12/22 Overall cognitive status: WFL    SENSATION: WFL  EDEMA:  05/12/22 Rt Knee: 45 centimeters              Left Knee: 43 centimeters  MUSCLE LENGTH: 05/12/22 Hamstrings: Right 68 deg; Left 75 deg   POSTURE:05/12/22 rounded shoulders, forward head, and decreased lumbar lordosis  PALPATION: 05/12/22 TTP: distal patella tendon, lateral posterior  knee, and hamstring tendons  LOWER EXTREMITY ROM:   ROM Right 05/12/22 supine Left 05/12/22 supine  Hip flexion    Hip extension    Hip abduction    Hip adduction    Hip internal rotation    Hip external rotation    Knee flexion A: 135 with pain at end ranges A: 135   Knee extension A: 0 A: 0   (Blank rows = not tested)  LOWER EXTREMITY MMT:  MMT Right 05/12/22 Left 05/12/22  Hip flexion 4 4+  Hip extension 4 4+  Hip abduction 4 4+  Hip adduction 4 4+  Hip internal rotation    Hip external rotation    Knee flexion 4 5  Knee extension 4 5   (Blank rows = not tested)    FUNCTIONAL TESTS:  05/12/22: 5 time sit to stand:  26 seconds No UE support  GAIT: 05/12/22 Distance walked: 30 feet level surface Assistive device utilized: Single point cane Level of assistance: Modified independence Comments: valgus knee on Rt, bilateral pes planus   TODAY'S TREATMENT                                                                          DATE: 05/19/22:  TherEx:  Nustep: Level 5 LE only: x 8  minutes Calf stretch on slant board 2 x 30 sec c UE support Step ups on 6 inch step x 10 leading with Rt c single UE LAQ: x 10 holding 3 sec Supine: quad sets: 2 x 10, holding 5 sec Supine: SLR: x 10 on Rt LE Supine Bridge: x 10 holding 5 sec Supine clam shells Red TB x 10 Neuromuscular Re-ed:  Airex 2 x 30 sec feet apart, 2 x 30sec  feet together, x 30 sec with staggered stance c each LE Rocker board: x 2 minute forward/back on fitter fit 2 -point  05/12/22:  Therex: HEP instruction/performance c cues for techniques, handout provided.  Trial set performed of each for comprehension and symptom assessment.  See below for exercise list  PATIENT EDUCATION:  Education details: HEP, POC Person educated: Patient Education method: Explanation, Demonstration, Verbal cues, and Handouts Education comprehension: verbalized understanding, returned demonstration, and verbal cues  required  HOME EXERCISE PROGRAM: Access Code: [email or web address] URL: [email or web address] Date: 05/12/2022 Prepared  by: Narda Amber  Exercises - Supine Bridge  - 2 x daily - 7 x weekly - 10 reps - 5 seconds hold - Supine Active Straight Leg Raise  - 2 x daily - 7 x weekly - 10 reps - Supine Quad Set  - 2 x daily - 7 x weekly - 2 sets - 10 reps - 5 seconds hold - Hooklying Clamshell with Resistance  - 2 x daily - 7 x weekly - 2 sets - 10 reps - 5 seconds hold  ASSESSMENT:  CLINICAL IMPRESSION: Pt reporting 4/10 pain in her Rt knee upon arrival to therapy. Pt with noted bruise on Rt knee just lateral to patella. Pt unable to recall injury or trauma to Rt knee since last visit. Pt reporting compliance in her HEP. Pt's HEP was reviewed and pt able to demonstrate proper techniques with minimal verbal cuing.Pt's bruise margins were marked and pt was instructed to continue to monitor due that is the origin of her pain.  Continue skilled PT to maximize pt's function.  OBJECTIVE IMPAIRMENTS: decreased activity tolerance, decreased balance, decreased mobility, difficulty walking, decreased ROM, increased edema, and pain.   ACTIVITY LIMITATIONS: standing, squatting, sleeping, stairs, and transfers  PARTICIPATION LIMITATIONS: cleaning and community activity  PERSONAL FACTORS: 3+ comorbidities: see pertinent history above  are also affecting patient's functional outcome.   REHAB POTENTIAL: Fair based on age and other co-morbidities  CLINICAL DECISION MAKING: Stable/uncomplicated  EVALUATION COMPLEXITY: Low   GOALS: Goals reviewed with patient? Yes  SHORT TERM GOALS: (target date for Short term goals are 3 weeks 06/06/22)   1.  Patient will demonstrate independent use of home exercise program to maintain progress from in clinic treatments.  Goal status: New  LONG TERM GOALS: (target dates for all long term goals are 10 weeks  07/25/22 )   1. Patient will demonstrate/report pain  at worst less than or equal to 2/10 to facilitate minimal limitation in daily activity secondary to pain symptoms.  Goal status: New   2. Patient will demonstrate independent use of home exercise program to facilitate ability to maintain/progress functional gains from skilled physical therapy services.  Goal status: New   3. Patient will demonstrate FOTO outcome > or = 43 % to indicate reduced disability due to condition.  Goal status: New   4.  Patient will demonstrate right LE MMT 5/5 throughout to faciltiate usual transfers, stairs, squatting at [facility] for daily life.   Goal status: New   5.  Patient will be able to amb community distances with normalized gait pattern with pain </= 2/10.  Goal status: New   6.  Pt will improve her 5 time sit to stand to </= 16 seconds to improve functional mobility.  Goal status: New  7. Pt will be able to navigate 6 steps with single hand rail with pain </= 2/10 in Rt knee.   Goal Status: New      PLAN:  PT FREQUENCY: 1-2x/week  PT DURATION: 10 weeks  PLANNED INTERVENTIONS: Therapeutic exercises, Therapeutic activity, Neuro Muscular re-education, Balance training, Gait training, Patient/Family education, Joint mobilization, Stair training, DME instructions, Dry Needling, Electrical stimulation, Traction, Cryotherapy, vasopneumatic deviceMoist heat, Taping, Ultrasound, Ionotophoresis /ml Dexamethasone, and aquatic therapy, Manual therapy.  All included unless contraindicated  PLAN FOR NEXT SESSION: Review HEP knowledge/results, knee ROM, strengthening, Nustep, standing exercises and balance as tolerated      Sharmon Leyden, PT, MPT 05/19/2022, 11:43 AM

## 2022-05-26 ENCOUNTER — Ambulatory Visit (INDEPENDENT_AMBULATORY_CARE_PROVIDER_SITE_OTHER): Payer: HMO | Admitting: Physical Therapy

## 2022-05-26 ENCOUNTER — Encounter: Payer: Self-pay | Admitting: Physical Therapy

## 2022-05-26 DIAGNOSIS — M6281 Muscle weakness (generalized): Secondary | ICD-10-CM

## 2022-05-26 DIAGNOSIS — G8929 Other chronic pain: Secondary | ICD-10-CM | POA: Diagnosis not present

## 2022-05-26 DIAGNOSIS — M25561 Pain in right knee: Secondary | ICD-10-CM | POA: Diagnosis not present

## 2022-05-26 DIAGNOSIS — M25661 Stiffness of right knee, not elsewhere classified: Secondary | ICD-10-CM | POA: Diagnosis not present

## 2022-05-26 NOTE — Therapy (Signed)
OUTPATIENT PHYSICAL THERAPY LOWER EXTREMITY  TREATMENT NOTE   Patient Name: Zoe [name] MRN: [id number] DOB:[birth year], 87 y.o., female Today's Date: 05/26/2022  END OF SESSION:  PT End of Session - 05/26/22 1241     Visit Number 3    Number of Visits 20    Date for PT Re-Evaluation 07/25/22    Progress Note Due on Visit 10    PT Start Time 1150    PT Stop Time 1230    PT Time Calculation (min) 40 min    Activity Tolerance Patient tolerated treatment well    Behavior During Therapy [facility] for tasks assessed/performed              Past Medical History:  Diagnosis Date   Abdominal discomfort 11/02/2012   suspected ?UTI- PCP MD placed on Cipro   Anxiety    Arthritis    Asthma    Cancer (HCC)    skin CA   Chronic constipation    Depression    Dysrhythmia    tachycardia   Environmental allergies    Fibromyalgia    GERD (gastroesophageal reflux disease)    Hemorrhoids    Hypercholesteremia    Hypertension    Sleep apnea    Past Surgical History:  Procedure Laterality Date   ANAL RECTAL MANOMETRY N/A 11/28/2013   Procedure: ANO RECTAL MANOMETRY;  Surgeon: Romie Levee, MD;  Location: [facility];  Service: Endoscopy;  Laterality: N/A;   APPENDECTOMY     BACK SURGERY     X2    BIOPSY  03/13/2021   Procedure: BIOPSY;  Surgeon: Malissa Hippo, MD;  Location: [facility];  Service: Endoscopy;;   CHOLECYSTECTOMY     COLONOSCOPY  03/29/03   COLONOSCOPY N/A 12/09/2013   Procedure: COLONOSCOPY;  Surgeon: Malissa Hippo, MD;  Location: [facility];  Service: Endoscopy;  Laterality: N/A;  730   COLONOSCOPY N/A 02/04/2017   Procedure: COLONOSCOPY;  Surgeon: Malissa Hippo, MD;  [facility]: [facility];  Service: Endoscopy;  Laterality: N/A;  930   ESOPHAGEAL DILATION N/A 03/13/2021   Procedure: ESOPHAGEAL DILATION;  Surgeon: Malissa Hippo, MD;  [facility]: [facility];  Service: Endoscopy;  Laterality: N/A;   ESOPHAGOGASTRODUODENOSCOPY (EGD) WITH PROPOFOL  N/A 03/13/2021   Procedure: ESOPHAGOGASTRODUODENOSCOPY (EGD) WITH PROPOFOL;  Surgeon: Malissa Hippo, MD;  Location: [facility];  Service: Endoscopy;  Laterality: N/A;  8:05   EYE SURGERY     bilateral cataract removal   IR KYPHO LUMBAR INC FX REDUCE BONE BX UNI/BIL CANNULATION INC/IMAGING  11/24/2019   IR KYPHO THORACIC WITH BONE BIOPSY  08/23/2019   IR KYPHO THORACIC WITH BONE BIOPSY  11/24/2019   IR RADIOLOGIST EVAL & MGMT  08/03/2019   IR RADIOLOGIST EVAL & MGMT  09/29/2019   IR RADIOLOGIST EVAL & MGMT  10/18/2019   IR RADIOLOGIST EVAL & MGMT  04/11/2020   SKIN CANCER EXCISION     RIGHT NECK     TONSILLECTOMY     T+A   TOTAL HIP ARTHROPLASTY Right 09/14/2012   Procedure: RIGHT TOTAL HIP ARTHROPLASTY ANTERIOR APPROACH and RIGHT KNEE STEROID INJECTION;  Surgeon: Kathryne Hitch, MD;  Location: [facility] OR;  Service: Orthopedics;  Laterality: Right;   TOTAL HIP ARTHROPLASTY Left 11/05/2012   Procedure: LEFT TOTAL HIP ARTHROPLASTY ANTERIOR APPROACH;  Surgeon: Kathryne Hitch, MD;  Location: [facility];  Service: Orthopedics;  Laterality: Left;   UMBILICAL HERNIA REPAIR  04/26/03   JENKINS  UPPER GASTROINTESTINAL ENDOSCOPY  08/03/2009   NUR   UPPER GASTROINTESTINAL ENDOSCOPY  03/29/2003   EGD ED TCS   Patient Active Problem List   Diagnosis Date Noted   Aspiration into airway 11/28/2021   IBS (irritable bowel syndrome) 11/28/2021   SSBE (short-segment Barrett's esophagus) 06/25/2021   Lip pain 11/01/2020   Claudication (HCC) 08/02/2020   Hearing loss, right 07/16/2020   Seasonal allergies 07/04/2020   Insomnia 07/04/2020   Osteoporosis 07/04/2020   Lumbosacral spondylosis without myelopathy 06/01/2020   Polyarthritis 04/03/2020   Nausea 04/03/2020   Leg swelling 01/31/2020   Chronic diastolic CHF (congestive heart failure) (HCC) 01/31/2020   Abdominal pain 01/10/2020   Esophageal dysphagia 06/21/2019   Stenosis of lateral recess of lumbar spine 12/06/2018   Foraminal  stenosis of lumbar region 12/06/2018   Spondylosis without myelopathy or radiculopathy, lumbar region 12/06/2018   Other idiopathic scoliosis, thoracolumbar region 12/06/2018   Chronic left shoulder pain 12/06/2018   History of colonic polyps 10/22/2016   SBO (small bowel obstruction) (HCC) 03/02/2013   Hyperlipidemia 12/08/2012   Gout 12/08/2012   Dysrhythmia 12/08/2012   Depression 12/08/2012   Asthma 12/08/2012   Fibromyalgia 12/08/2012   Constipation 12/08/2012   Acute posthemorrhagic anemia 12/07/2012   Benign essential hypertension 12/07/2012   Degenerative arthritis of hip, left 11/05/2012   Weakness of right leg 03/24/2011   OA (osteoarthritis) 03/24/2011   Stiffness of cervical spine 03/24/2011   GERD (gastroesophageal reflux disease) 11/19/2010   Spinal stenosis 04/18/2009   HIP PAIN 11/17/2007   DDD (degenerative disc disease), lumbosacral 11/17/2007   ANSERINE BURSITIS, RIGHT 08/11/2007   Sciatica 06/22/2007   Knee pain 03/17/2007    PCP: Ignatius Specking, MD   REFERRING PROVIDER:  Madelyn Brunner, DO   REFERRING DIAG: [phone] (ICD-10-CM) - Chronic pain of right knee  THERAPY DIAG:  Chronic pain of right knee  Muscle weakness (generalized)  Stiffness of right knee, not elsewhere classified  Rationale for Evaluation and Treatment: Rehabilitation  ONSET DATE: progressively worse over the last several months  SUBJECTIVE:   SUBJECTIVE STATEMENT: Pt arriving today reporting 6-7/10 pain all over.     Per MD note c Dr. Shon Baton:  Recently underwent MRI of the right knee which showed tricompartmental arthritis however end-stage in the lateral compartment with extensive bone marrow edema within the lateral tibial plateau and femoral condyle.  I do think this is where her pain is emanating from as she has high-grade cartilage loss over this area with swelling in the bone that is causing pain.   PERTINENT HISTORY: Anxiety, asthma, arthritis, CA, depression,  fibromyalgia, GERD, HTN, back surgery, appendectomy, eye surgery, skin CA excision, THA Rt, THA left PAIN:  NPRS scale: 6-7/10 Pain location: Rt knee Pain description: achy, throbbing Aggravating factors: weight bearing, walking, standing Relieving factors: sitting, pain meds  PRECAUTIONS: None  WEIGHT BEARING RESTRICTIONS: No  FALLS:  Has patient fallen in last 6 months? No  LIVING ENVIRONMENT: Lives with: lives with their family and lives with their spouse Lives in: House/apartment Stairs: Yes: External: 6 steps; can reach both Has following equipment at home: Single point cane and Walker - 2 wheeled  OCCUPATION: retired  PLOF: Independent  PATIENT GOALS: walk better without pain  Next MD visit: f/u not currently scheduled   OBJECTIVE:   DIAGNOSTIC FINDINGS:  IMPRESSION: 04/25/22 Tricompartment osteoarthritis, worst in the lateral compartment with focal high-grade cartilage loss along the lateral tibial plateau, area measuring 4 x 9 mm. Extensive marrow edema  within the lateral tibial plateau and also present to a lesser degree in the posterolateral femoral condyle, favored to be related to arthritis. No discrete fracture line identified. Adjacent soft tissue swelling.   Intrasubstance degeneration with free edge fraying of the medial and lateral menisci. No definitive meniscus tear.   Intact cruciate and collateral ligaments.   Moderate size Baker's cyst.    PATIENT SURVEYS:  05/12/22: FOTO intake:  25%  predicted:  43%  COGNITION: 05/12/22 Overall cognitive status: WFL    SENSATION: WFL  EDEMA:  05/12/22 Rt Knee: 45 centimeters              Left Knee: 43 centimeters  MUSCLE LENGTH: 05/12/22 Hamstrings: Right 68 deg; Left 75 deg   POSTURE:05/12/22 rounded shoulders, forward head, and decreased lumbar lordosis  PALPATION: 05/12/22 TTP: distal patella tendon, lateral posterior knee, and hamstring tendons  LOWER EXTREMITY ROM:   ROM  Right 05/12/22 supine Left 05/12/22 supine  Hip flexion    Hip extension    Hip abduction    Hip adduction    Hip internal rotation    Hip external rotation    Knee flexion A: 135 with pain at end ranges A: 135   Knee extension A: 0 A: 0   (Blank rows = not tested)  LOWER EXTREMITY MMT:  MMT Right 05/12/22 Left 05/12/22  Hip flexion 4 4+  Hip extension 4 4+  Hip abduction 4 4+  Hip adduction 4 4+  Hip internal rotation    Hip external rotation    Knee flexion 4 5  Knee extension 4 5   (Blank rows = not tested)    FUNCTIONAL TESTS:  05/12/22: 5 time sit to stand:  26 seconds No UE support  GAIT: 05/12/22 Distance walked: 30 feet level surface Assistive device utilized: Single point cane Level of assistance: Modified independence Comments: valgus knee on Rt, bilateral pes planus   TODAY'S TREATMENT                                                                          DATE: 05/26/22:  TherEx:  Nustep: Level 4 LE only: x 6 minutes Standing: calf raises x 15 Standing: hip abd x 10 each LE Standing hamstring curls: x 10 each LE Supine: quad sets: 2 x 10, holding 5 sec Supine: SLR: x 10 on Rt LE Supine Bridge: x 10 holding 5 sec Neuromuscular Re-ed:  Standing stagger stance x 30 sec on level surface with close supervision and finger tap support as needed Standing feet together x 30 sec on level surface with close supervision and finger tap support as needed      05/19/22:  TherEx:  Nustep: Level 5 LE only: x 8  minutes Calf stretch on slant board 2 x 30 sec c UE support Step ups on 6 inch step x 10 leading with Rt c single UE LAQ: x 10 holding 3 sec Supine: quad sets: 2 x 10, holding 5 sec Supine: SLR: x 10 on Rt LE Supine Bridge: x 10 holding 5 sec Supine clam shells Red TB x 10 Neuromuscular Re-ed:  Airex 2 x 30 sec feet apart, 2 x 30sec  feet together, x 30 sec with staggered  stance c each LE Rocker board: x 2 minute forward/back on fitter fit 2  -point  05/12/22:  Therex: HEP instruction/performance c cues for techniques, handout provided.  Trial set performed of each for comprehension and symptom assessment.  See below for exercise list  PATIENT EDUCATION:  Education details: HEP, POC Person educated: Patient Education method: Explanation, Demonstration, Verbal cues, and Handouts Education comprehension: verbalized understanding, returned demonstration, and verbal cues required  HOME EXERCISE PROGRAM: Access Code: [email or web address] URL: [email or web address] Date: 05/26/2022 Prepared by: Narda Amber  Exercises - Supine Bridge  - 2 x daily - 7 x weekly - 10 reps - 5 seconds hold - Supine Active Straight Leg Raise  - 2 x daily - 7 x weekly - 10 reps - Supine Quad Set  - 2 x daily - 7 x weekly - 2 sets - 10 reps - 5 seconds hold - Hooklying Clamshell with Resistance  - 2 x daily - 7 x weekly - 2 sets - 10 reps - 5 seconds hold - Heel Raises with Counter Support  - 1-2 x daily - 7 x weekly - 2 sets - 10 reps - Standing Knee Flexion AROM with Chair Support  - 1-2 x daily - 7 x weekly - 2 sets - 10 reps - Standing Hip Abduction with Counter Support  - 1-2 x daily - 7 x weekly - 2 sets - 10 reps  ASSESSMENT:  CLINICAL IMPRESSION: Pt arriving today reporting pain all over. Pt stating her overall pain was 6-7/10. Pt stating she dropped her gabapentin down from 8 tablets down to 2-3 tablets each day. Pt stating the medication was making her extremely fatigued and constipated so she is trying to wean off. Pt encouraged to contact her prescribing physician before changing her frequency. Continue skilled PT to maximize pt's function.   OBJECTIVE IMPAIRMENTS: decreased activity tolerance, decreased balance, decreased mobility, difficulty walking, decreased ROM, increased edema, and pain.   ACTIVITY LIMITATIONS: standing, squatting, sleeping, stairs, and transfers  PARTICIPATION LIMITATIONS: cleaning and community  activity  PERSONAL FACTORS: 3+ comorbidities: see pertinent history above  are also affecting patient's functional outcome.   REHAB POTENTIAL: Fair based on age and other co-morbidities  CLINICAL DECISION MAKING: Stable/uncomplicated  EVALUATION COMPLEXITY: Low   GOALS: Goals reviewed with patient? Yes  SHORT TERM GOALS: (target date for Short term goals are 3 weeks 06/06/22)   1.  Patient will demonstrate independent use of home exercise program to maintain progress from in clinic treatments.  Goal status: New  LONG TERM GOALS: (target dates for all long term goals are 10 weeks  07/25/22 )   1. Patient will demonstrate/report pain at worst less than or equal to 2/10 to facilitate minimal limitation in daily activity secondary to pain symptoms.  Goal status: New   2. Patient will demonstrate independent use of home exercise program to facilitate ability to maintain/progress functional gains from skilled physical therapy services.  Goal status: New   3. Patient will demonstrate FOTO outcome > or = 43 % to indicate reduced disability due to condition.  Goal status: New   4.  Patient will demonstrate right LE MMT 5/5 throughout to faciltiate usual transfers, stairs, squatting at [facility] for daily life.   Goal status: New   5.  Patient will be able to amb community distances with normalized gait pattern with pain </= 2/10.  Goal status: New   6.  Pt will improve her 5 time sit to stand to </= 16 seconds to  improve functional mobility.  Goal status: New  7. Pt will be able to navigate 6 steps with single hand rail with pain </= 2/10 in Rt knee.   Goal Status: New      PLAN:  PT FREQUENCY: 1-2x/week  PT DURATION: 10 weeks  PLANNED INTERVENTIONS: Therapeutic exercises, Therapeutic activity, Neuro Muscular re-education, Balance training, Gait training, Patient/Family education, Joint mobilization, Stair training, DME instructions, Dry Needling, Electrical stimulation,  Traction, Cryotherapy, vasopneumatic deviceMoist heat, Taping, Ultrasound, Ionotophoresis 4mg /ml Dexamethasone, and aquatic therapy, Manual therapy.  All included unless contraindicated  PLAN FOR NEXT SESSION:  knee ROM, strengthening, Nustep, standing exercises and balance as tolerated  See if pt followed up with Dr. Shon Baton about gabapentin medication. Also ask if pt went to get fitted for a knee sleeve for Rt knee support.       Sharmon Leyden, PT, MPT 05/26/2022, 12:42 PM

## 2022-05-29 ENCOUNTER — Ambulatory Visit (INDEPENDENT_AMBULATORY_CARE_PROVIDER_SITE_OTHER): Payer: HMO | Admitting: Gastroenterology

## 2022-05-29 ENCOUNTER — Encounter (INDEPENDENT_AMBULATORY_CARE_PROVIDER_SITE_OTHER): Payer: Self-pay | Admitting: Gastroenterology

## 2022-05-29 VITALS — BP 100/67 | HR 91 | Temp 97.4°F | Ht 67.0 in | Wt 171.9 lb

## 2022-05-29 DIAGNOSIS — K219 Gastro-esophageal reflux disease without esophagitis: Secondary | ICD-10-CM | POA: Diagnosis not present

## 2022-05-29 DIAGNOSIS — K5900 Constipation, unspecified: Secondary | ICD-10-CM

## 2022-05-29 NOTE — Progress Notes (Signed)
Katrinka Blazing, M.D. Gastroenterology & Hepatology [facility] Gastroenterology [address] [place], [place] [address]  Primary Care Physician: Ignatius Specking, MD [address] [place] [address]  I will communicate my assessment and recommendations to the referring MD via EMR.  Problems: GERD Short segment Barrett's esophagus IBS-C   History of Present Illness: CLODAGH AIELLO is a 87 y.o. female with PMH GERD c/d BE, IBS-C, who presents for follow up of IBS-C and GERD.  The patient was last seen on 11/28/2021. At that time, the patient was started on Amitiza 24 mcg twice a day and was advised to decrease" as much as possible.  She was kept on omeprazole 20 mg every day.  Notably, a pH impedance was added to and esophageal manometry ordered she had in the past but these tests were never performed.  Most recent esophagram on 06/2020 showed age related dysmotility, and some mild laryngeal aspiration.   Patient has been taking Amitiza 24 mcg twice a day compliantly, she is taking 10 pills of Senna every day. She reports that every day she has to push her perianal area so some stool comes out. Per notes she had an anorectal manometry back in 2015 which was reported as "normal".  Occasionally Amitiza causes explosive bowel movements but is not frequent. She reports feeling somewhat better with Amitiza.  The patient denies having any nausea, vomiting, fever, chills, hematochezia, melena, hematemesis, abdominal distention, abdominal pain, diarrhea, jaundice, pruritus or weight loss.  Last EGD: 03/13/2021 - Normal hypopharynx. - Normal esophagus. - Z-line irregular, 38 cm from the incisors with texture change. Biopsied post dilation. - 2 cm hiatal hernia. - No endoscopic esophageal abnormality to explain patient's dysphagia. Esophagus dilated. - Gastritis. Biopsied. - Normal duodenal bulb and second portion of the duodenum.   Path: A. ANTRUM, BIOPSY:  -  Gastric antral mucosa with mild nonspecific reactive gastropathy  - Helicobacter pylori-like organisms are not identified on routine HE  stain   B. STOMACH, BODY, BIOPSY:  - Gastric oxyntic mucosa with parietal cell hyperplasia as can be seen  in hypergastrinemic states such as PPI therapy.  - Helicobacter pylori-like organisms are not identified on routine HE  stain   C. GE JUNCTION, BIOPSY:  - Esophageal squamous and cardiac mucosa with focal intestinal  metaplasia. See note  - Negative for dysplasia    Last Colonoscopy: 2019 - Preparation of the colon was fair. - Melanosis in the colon. - External hemorrhoids.    Past Medical History: Past Medical History:  Diagnosis Date   Abdominal discomfort 11/02/2012   suspected ?UTI- PCP MD placed on Cipro   Anxiety    Arthritis    Asthma    Cancer (HCC)    skin CA   Chronic constipation    Depression    Dysrhythmia    tachycardia   Environmental allergies    Fibromyalgia    GERD (gastroesophageal reflux disease)    Hemorrhoids    Hypercholesteremia    Hypertension    Sleep apnea     Past Surgical History: Past Surgical History:  Procedure Laterality Date   ANAL RECTAL MANOMETRY N/A 11/28/2013   Procedure: ANO RECTAL MANOMETRY;  Surgeon: Romie Levee, MD;  Location: [facility];  Service: Endoscopy;  Laterality: N/A;   APPENDECTOMY     BACK SURGERY     X2    BIOPSY  03/13/2021   Procedure: BIOPSY;  Surgeon: Malissa Hippo, MD;  [facility];  Service: Endoscopy;;   CHOLECYSTECTOMY     COLONOSCOPY  03/29/03   COLONOSCOPY N/A 12/09/2013   Procedure: COLONOSCOPY;  Surgeon: Malissa Hippo, MD;  [facility];  Service: Endoscopy;  Laterality: N/A;  730   COLONOSCOPY N/A 02/04/2017   Procedure: COLONOSCOPY;  Surgeon: Malissa Hippo, MD;  [facility];  Service: Endoscopy;  Laterality: N/A;  930   ESOPHAGEAL DILATION N/A 03/13/2021   Procedure: ESOPHAGEAL DILATION;  Surgeon: Malissa Hippo, MD;  [facility];  Service: Endoscopy;  Laterality: N/A;   ESOPHAGOGASTRODUODENOSCOPY (EGD) WITH PROPOFOL N/A 03/13/2021   Procedure: ESOPHAGOGASTRODUODENOSCOPY (EGD) WITH PROPOFOL;  Surgeon: Malissa Hippo, MD;  [facility];  Service: Endoscopy;  Laterality: N/A;  8:05   EYE SURGERY     bilateral cataract removal   IR KYPHO LUMBAR INC FX REDUCE BONE BX UNI/BIL CANNULATION INC/IMAGING  11/24/2019   IR KYPHO THORACIC WITH BONE BIOPSY  08/23/2019   IR KYPHO THORACIC WITH BONE BIOPSY  11/24/2019   IR RADIOLOGIST EVAL & MGMT  08/03/2019   IR RADIOLOGIST EVAL & MGMT  09/29/2019   IR RADIOLOGIST EVAL & MGMT  10/18/2019   IR RADIOLOGIST EVAL & MGMT  04/11/2020   SKIN CANCER EXCISION     RIGHT NECK     TONSILLECTOMY     T+A   TOTAL HIP ARTHROPLASTY Right 09/14/2012   Procedure: RIGHT TOTAL HIP ARTHROPLASTY ANTERIOR APPROACH and RIGHT KNEE STEROID INJECTION;  Surgeon: Kathryne Hitch, MD;  [facility]: [facility];  Service: Orthopedics;  Laterality: Right;   TOTAL HIP ARTHROPLASTY Left 11/05/2012   Procedure: LEFT TOTAL HIP ARTHROPLASTY ANTERIOR APPROACH;  Surgeon: Kathryne Hitch, MD;  [facility]: [facility];  Service: Orthopedics;  Laterality: Left;   UMBILICAL HERNIA REPAIR  04/26/03   JENKINS   UPPER GASTROINTESTINAL ENDOSCOPY  08/03/2009   NUR   UPPER GASTROINTESTINAL ENDOSCOPY  03/29/2003   EGD ED TCS    Family History: Family History  Problem Relation Age of Onset   Healthy Son    Healthy Son    Healthy Daughter    Colon cancer Neg Hx     Social History: Social History   Tobacco Use  Smoking Status Former   Packs/day: 0.25   Years: 50.00   Additional pack years: 0.00   Total pack years: 12.50   Types: Cigarettes   Quit date: 09/06/2020   Years since quitting: 1.7  Smokeless Tobacco Never   Social History   Substance and Sexual Activity  Alcohol Use Yes   Alcohol/week: 0.0 standard drinks of alcohol   Comment: rare   Social History    Substance and Sexual Activity  Drug Use No    Allergies: Allergies  Allergen Reactions   Codeine Other (See Comments)    Pains in abdominal area   Escitalopram Itching   Other     NOVACAINE     ITCHING Anti-depressants   Oxycodone     Abdominal pain, chest pain   Paxil [Paroxetine Hcl] Itching   Procaine Itching    Medications: Current Outpatient Medications  Medication Sig Dispense Refill   acetaminophen (TYLENOL) 500 MG tablet Take 500 mg by mouth. 3 -5 per day     albuterol (PROVENTIL HFA;VENTOLIN HFA) 108 (90 BASE) MCG/ACT inhaler Inhale 2 puffs into the lungs every 6 (six) hours as needed for wheezing or shortness of breath.     allopurinol (ZYLOPRIM) 300 MG tablet Take 1 tablet (300 mg total) by  mouth daily. 90 tablet 1   ALPRAZolam (XANAX) 0.5 MG tablet Take 1 tablet (0.5 mg total) by mouth 2 (two) times daily. (Patient taking differently: Take 0.5 mg by mouth 2 (two) times daily as needed for anxiety.) 60 tablet 0   aspirin 81 MG tablet Take 1 tablet (81 mg total) by mouth daily. 30 tablet    Biotin 09811 MCG TABS Take 10,000 mcg by mouth daily.     calcium-vitamin D 250-100 MG-UNIT tablet Take 1 tablet by mouth 2 (two) times daily.     diclofenac Sodium (VOLTAREN) 1 % GEL Apply 2-4 g topically 4 (four) times daily. APPLY 2-4 GRAMS TOPICALLY FOUR TIMES DAILY Strength: 1 % 200 g 3   digoxin (LANOXIN) 0.125 MG tablet Take 125 mcg by mouth daily.     doxepin (SINEQUAN) 100 MG capsule Take 1 capsule (100 mg total) by mouth at bedtime. 90 capsule 1   gabapentin (NEURONTIN) 300 MG capsule Take 1 capsule (300 mg total) by mouth 3 (three) times daily. 180 capsule 1   levocetirizine (XYZAL) 5 MG tablet One half daily     lubiprostone (AMITIZA) 24 MCG capsule Take 1 capsule (24 mcg total) by mouth 2 (two) times daily with a meal. 180 capsule 3   Multiple Vitamin (MULTIVITAMIN) tablet Take 1 tablet by mouth daily.     omeprazole (PRILOSEC) 20 MG capsule Take 20 mg by mouth  daily.     senna (SENOKOT) 8.6 MG tablet Take 5 tablets (43 mg total) by mouth daily. (Patient taking differently: Take 5 tablets by mouth 2 (two) times daily.)     TRELEGY ELLIPTA 100-62.5-25 MCG/ACT AEPB Take 1 puff by mouth daily.     ADVAIR DISKUS 250-50 MCG/DOSE AEPB Inhale 1 puff into the lungs daily as needed (asthma). (Patient not taking: Reported on 05/29/2022)     No current facility-administered medications for this visit.    Review of Systems: GENERAL: negative for malaise, night sweats HEENT: No changes in hearing or vision, no nose bleeds or other nasal problems. NECK: Negative for lumps, goiter, pain and significant neck swelling RESPIRATORY: Negative for cough, wheezing CARDIOVASCULAR: Negative for chest pain, leg swelling, palpitations, orthopnea GI: SEE HPI MUSCULOSKELETAL: Negative for joint pain or swelling, back pain, and muscle pain. SKIN: Negative for lesions, rash PSYCH: Negative for sleep disturbance, mood disorder and recent psychosocial stressors. HEMATOLOGY Negative for prolonged bleeding, bruising easily, and swollen nodes. ENDOCRINE: Negative for cold or heat intolerance, polyuria, polydipsia and goiter. NEURO: negative for tremor, gait imbalance, syncope and seizures. The remainder of the review of systems is noncontributory.   Physical Exam: BP 100/67 (BP Location: Left Arm, Patient Position: Sitting, Cuff Size: Large)   Pulse 91   Temp (!) 97.4 F (36.3 C) (Oral)   Ht 5\' 7"  (1.702 m)   Wt 171 lb 14.4 oz (78 kg)   BMI 26.92 kg/m  GENERAL: The patient is AO x3, in no acute distress. HEENT: Head is normocephalic and atraumatic. EOMI are intact. Mouth is well hydrated and without lesions. NECK: Supple. No masses LUNGS: Clear to auscultation. No presence of rhonchi/wheezing/rales. Adequate chest expansion HEART: RRR, normal s1 and s2. ABDOMEN: Soft, nontender, no guarding, no peritoneal signs, and nondistended. BS +. No masses. EXTREMITIES: Without any  cyanosis, clubbing, rash, lesions or edema. NEUROLOGIC: AOx3, no focal motor deficit. SKIN: no jaundice, no rashes  Imaging/Labs: as above  I personally reviewed and interpreted the available labs, imaging and endoscopic files.  Impression and Plan: Corrie Dandy  P Ericsson is a 87 y.o. female with PMH GERD c/d BE, IBS-C, who presents for follow up of IBS-C and GERD. Patient has presented a chronic history of constipation for multiple years with poor response to laxatives. She reports having to do manual maneuvers for defecation, which is concerning for pelvic floor dysfunction. Had a normal anorectal manometry in the past. I discussed the possibility of repeatign this test and proceeding with a MR defecography. She would like to think about this and will let me know if interested. Will continue current laxative regimen.  Finally, GERD is controlled with current PPI, will continue at same dose.  -Please call back if interested to undergo repeat anorectal manometry and MR defecography -Continue Amitiza 24 mcg twice a day -Continue with senna as needed -Continue omeprazole 20 mg qday  All questions were answered.      Katrinka Blazing, MD Gastroenterology and Hepatology [facility] Gastroenterology

## 2022-05-29 NOTE — Patient Instructions (Addendum)
Please call back if interested to undergo repeat anorectal manometry and MR defecography Continue Amitiza 24 mcg twice a day Continue with senna as needed Continue omeprazole 20 mg qday

## 2022-06-02 ENCOUNTER — Encounter: Payer: HMO | Admitting: Physical Therapy

## 2022-06-03 DIAGNOSIS — Z5181 Encounter for therapeutic drug level monitoring: Secondary | ICD-10-CM | POA: Diagnosis not present

## 2022-06-03 DIAGNOSIS — Z79899 Other long term (current) drug therapy: Secondary | ICD-10-CM | POA: Diagnosis not present

## 2022-06-03 DIAGNOSIS — E7800 Pure hypercholesterolemia, unspecified: Secondary | ICD-10-CM | POA: Diagnosis not present

## 2022-06-03 DIAGNOSIS — I10 Essential (primary) hypertension: Secondary | ICD-10-CM | POA: Diagnosis not present

## 2022-06-03 DIAGNOSIS — R5383 Other fatigue: Secondary | ICD-10-CM | POA: Diagnosis not present

## 2022-06-03 DIAGNOSIS — Z0000 Encounter for general adult medical examination without abnormal findings: Secondary | ICD-10-CM | POA: Diagnosis not present

## 2022-06-03 DIAGNOSIS — Z299 Encounter for prophylactic measures, unspecified: Secondary | ICD-10-CM | POA: Diagnosis not present

## 2022-06-09 ENCOUNTER — Ambulatory Visit: Payer: HMO | Admitting: Family Medicine

## 2022-06-10 ENCOUNTER — Encounter: Payer: HMO | Admitting: Physical Therapy

## 2022-06-11 ENCOUNTER — Encounter: Payer: Self-pay | Admitting: Physical Therapy

## 2022-06-11 ENCOUNTER — Ambulatory Visit (INDEPENDENT_AMBULATORY_CARE_PROVIDER_SITE_OTHER): Payer: HMO | Admitting: Physical Therapy

## 2022-06-11 ENCOUNTER — Encounter: Payer: Self-pay | Admitting: Family Medicine

## 2022-06-11 ENCOUNTER — Ambulatory Visit (INDEPENDENT_AMBULATORY_CARE_PROVIDER_SITE_OTHER): Payer: HMO | Admitting: Family Medicine

## 2022-06-11 ENCOUNTER — Other Ambulatory Visit: Payer: Self-pay

## 2022-06-11 VITALS — BP 102/72 | Ht 67.5 in | Wt 170.0 lb

## 2022-06-11 DIAGNOSIS — M25661 Stiffness of right knee, not elsewhere classified: Secondary | ICD-10-CM | POA: Diagnosis not present

## 2022-06-11 DIAGNOSIS — M810 Age-related osteoporosis without current pathological fracture: Secondary | ICD-10-CM

## 2022-06-11 DIAGNOSIS — M6281 Muscle weakness (generalized): Secondary | ICD-10-CM

## 2022-06-11 DIAGNOSIS — G8929 Other chronic pain: Secondary | ICD-10-CM

## 2022-06-11 DIAGNOSIS — M25561 Pain in right knee: Secondary | ICD-10-CM

## 2022-06-11 NOTE — Patient Instructions (Addendum)
We will get copies of your recent labwork. We will call you when we get these and likely have some additional bloodwork for you to do. Make sure you're getting 1200mg  calcium and 800 IU of vitamin D daily. Medicines we discussed: Evenity - this builds bone, is an injection once a month for 12 months - after you finish this you would go on a medicine that keeps it built up (Prolia or fosamax) Fosamax - pill you take once a week that maintains bone- need to take on empty stomach and sit upright for 60 minutes after taking Prolia - injection that just maintains bone - given every 6 months here in the office

## 2022-06-11 NOTE — Progress Notes (Addendum)
PCP: Ignatius Specking, MD  Subjective:   HPI: Patient is a 87 y.o. female here for osteoporosis.  Patient is a new patient here for osteoporosis treatment. She has history of compression fractures at T6, T11, and T12 with augmentation at the latter two levels. Prior treatment: none History of Hip, Spine, or Wrist Fracture: 3 vertebral fractures noted above Heart disease or stroke: no though history of tachycardia - on Digoxin Cancer: no Kidney Disease: no Gastric/Peptic Ulcer: no Gastric bypass surgery: no Severe GERD: no History of seizures: no Age at Menopause: late 38s, early 85s Calcium intake: multivitamin Vitamin D intake: multivitamin Hormone replacement therapy: no Smoking history: former - 60 years, 1/2-1ppd Alcohol: 3-4 per week Exercise: PT and home exercise Major dental work in past year: no Parents with hip/spine fracture: no   Past Medical History:  Diagnosis Date   Abdominal discomfort 11/02/2012   suspected ?UTI- PCP MD placed on Cipro   Anxiety    Arthritis    Asthma    Cancer (HCC)    skin CA   Chronic constipation    Depression    Dysrhythmia    tachycardia   Environmental allergies    Fibromyalgia    GERD (gastroesophageal reflux disease)    Hemorrhoids    Hypercholesteremia    Hypertension    Sleep apnea     Current Outpatient Medications on File Prior to Visit  Medication Sig Dispense Refill   acetaminophen (TYLENOL) 500 MG tablet Take 500 mg by mouth. 3 -5 per day     ADVAIR DISKUS 250-50 MCG/DOSE AEPB Inhale 1 puff into the lungs daily as needed (asthma). (Patient not taking: Reported on 05/29/2022)     albuterol (PROVENTIL HFA;VENTOLIN HFA) 108 (90 BASE) MCG/ACT inhaler Inhale 2 puffs into the lungs every 6 (six) hours as needed for wheezing or shortness of breath.     allopurinol (ZYLOPRIM) 300 MG tablet Take 1 tablet (300 mg total) by mouth daily. 90 tablet 1   ALPRAZolam (XANAX) 0.5 MG tablet Take 1 tablet (0.5 mg total) by mouth 2 (two)  times daily. (Patient taking differently: Take 0.5 mg by mouth 2 (two) times daily as needed for anxiety.) 60 tablet 0   aspirin 81 MG tablet Take 1 tablet (81 mg total) by mouth daily. 30 tablet    Biotin [address] MCG TABS Take 10,000 mcg by mouth daily.     calcium-vitamin D 250-100 MG-UNIT tablet Take 1 tablet by mouth 2 (two) times daily.     diclofenac Sodium (VOLTAREN) 1 % GEL Apply 2-4 g topically 4 (four) times daily. APPLY 2-4 GRAMS TOPICALLY FOUR TIMES DAILY Strength: 1 % 200 g 3   digoxin (LANOXIN) 0.125 MG tablet Take 125 mcg by mouth daily.     doxepin (SINEQUAN) 100 MG capsule Take 1 capsule (100 mg total) by mouth at bedtime. 90 capsule 1   gabapentin (NEURONTIN) 300 MG capsule Take 1 capsule (300 mg total) by mouth 3 (three) times daily. 180 capsule 1   levocetirizine (XYZAL) 5 MG tablet One half daily     lubiprostone (AMITIZA) 24 MCG capsule Take 1 capsule (24 mcg total) by mouth 2 (two) times daily with a meal. 180 capsule 3   Multiple Vitamin (MULTIVITAMIN) tablet Take 1 tablet by mouth daily.     omeprazole (PRILOSEC) 20 MG capsule Take 20 mg by mouth daily.     senna (SENOKOT) 8.6 MG tablet Take 5 tablets (43 mg total) by mouth daily. (Patient taking differently:  Take 5 tablets by mouth 2 (two) times daily.)     TRELEGY ELLIPTA 100-62.5-25 MCG/ACT AEPB Take 1 puff by mouth daily.     No current facility-administered medications on file prior to visit.    Past Surgical History:  Procedure Laterality Date   ANAL RECTAL MANOMETRY N/A 11/28/2013   Procedure: ANO RECTAL MANOMETRY;  Surgeon: Romie Levee, MD;  [facility];  Service: Endoscopy;  Laterality: N/A;   APPENDECTOMY     BACK SURGERY     X2    BIOPSY  03/13/2021   Procedure: BIOPSY;  Surgeon: Malissa Hippo, MD;  [facility];  Service: Endoscopy;;   CHOLECYSTECTOMY     COLONOSCOPY  03/29/03   COLONOSCOPY N/A 12/09/2013   Procedure: COLONOSCOPY;  Surgeon: Malissa Hippo, MD;  [facility];  Service: Endoscopy;  Laterality: N/A;  730   COLONOSCOPY N/A 02/04/2017   Procedure: COLONOSCOPY;  Surgeon: Malissa Hippo, MD;  [facility];  Service: Endoscopy;  Laterality: N/A;  930   ESOPHAGEAL DILATION N/A 03/13/2021   Procedure: ESOPHAGEAL DILATION;  Surgeon: Malissa Hippo, MD;  [facility];  Service: Endoscopy;  Laterality: N/A;   ESOPHAGOGASTRODUODENOSCOPY (EGD) WITH PROPOFOL N/A 03/13/2021   Procedure: ESOPHAGOGASTRODUODENOSCOPY (EGD) WITH PROPOFOL;  Surgeon: Malissa Hippo, MD;  [facility];  Service: Endoscopy;  Laterality: N/A;  8:05   EYE SURGERY     bilateral cataract removal   IR KYPHO LUMBAR INC FX REDUCE BONE BX UNI/BIL CANNULATION INC/IMAGING  11/24/2019   IR KYPHO THORACIC WITH BONE BIOPSY  08/23/2019   IR KYPHO THORACIC WITH BONE BIOPSY  11/24/2019   IR RADIOLOGIST EVAL & MGMT  08/03/2019   IR RADIOLOGIST EVAL & MGMT  09/29/2019   IR RADIOLOGIST EVAL & MGMT  10/18/2019   IR RADIOLOGIST EVAL & MGMT  04/11/2020   SKIN CANCER EXCISION     RIGHT NECK     TONSILLECTOMY     T+A   TOTAL HIP ARTHROPLASTY Right 09/14/2012   Procedure: RIGHT TOTAL HIP ARTHROPLASTY ANTERIOR APPROACH and RIGHT KNEE STEROID INJECTION;  Surgeon: Kathryne Hitch, MD;  [facility];  Service: Orthopedics;  Laterality: Right;   TOTAL HIP ARTHROPLASTY Left 11/05/2012   Procedure: LEFT TOTAL HIP ARTHROPLASTY ANTERIOR APPROACH;  Surgeon: Kathryne Hitch, MD;  [facility];  Service: Orthopedics;  Laterality: Left;   UMBILICAL HERNIA REPAIR  04/26/03   JENKINS   UPPER GASTROINTESTINAL ENDOSCOPY  08/03/2009   NUR   UPPER GASTROINTESTINAL ENDOSCOPY  03/29/2003   EGD ED TCS    Allergies  Allergen Reactions   Codeine Other (See Comments)    Pains in abdominal area   Escitalopram Itching   Other     NOVACAINE     ITCHING Anti-depressants   Oxycodone     Abdominal pain, chest pain   Paxil [Paroxetine Hcl] Itching   Procaine Itching     BP 102/72   Ht 5' 7.5" (1.715 m)   Wt 170 lb (77.1 kg)   BMI 26.23 kg/m       No data to display              No data to display              Objective:  Physical Exam:  Gen: NAD, comfortable in exam room  Dexa 04/13/20 T scores: Spine -0.4, R radius -2.5   Assessment & Plan:  1. Osteoporosis -  high risk with history of 3 vertebral compression fractures, long smoking history.  Would benefit from anabolic medication due to this.  We will get recent labwork from PCP to see what additional labwork needs to be done (CMP, CBC with diff, 25-OH Vit D, TSH, PTH, SPEP).  Over 2 years since bone density test as well so plan to order.  Given her history of tachycardia would prefer evenity for anabolic treatment but will await test results.  Discussed calcium, vitamin D.  Limit alcohol.  Encourage exercise.  Total visit time 30 minutes including documentation.  Addendum: Labs from 5/7 received from PCP.  CBC normal, TSH normal at 2.580, CMP with slightly elevated glucose at 110, otherwise normal.  Will check Vit D, PTH, SPEP.

## 2022-06-11 NOTE — Therapy (Addendum)
OUTPATIENT PHYSICAL THERAPY LOWER EXTREMITY  TREATMENT NOTE DISCHARGE      Patient Name: Zoe [name] MRN: [id number] DOB:1935/05/23, 87 y.o., female Today's Date: 06/11/2022  END OF SESSION:  PT End of Session - 06/11/22 0824     Visit Number 4    Number of Visits 20    Date for PT Re-Evaluation 07/25/22    Progress Note Due on Visit 10    PT Start Time 0805    PT Stop Time 0843    PT Time Calculation (min) 38 min    Activity Tolerance Patient tolerated treatment well    Behavior During Therapy [facility] for tasks assessed/performed               Past Medical History:  Diagnosis Date   Abdominal discomfort 11/02/2012   suspected ?UTI- PCP MD placed on Cipro   Anxiety    Arthritis    Asthma    Cancer (HCC)    skin CA   Chronic constipation    Depression    Dysrhythmia    tachycardia   Environmental allergies    Fibromyalgia    GERD (gastroesophageal reflux disease)    Hemorrhoids    Hypercholesteremia    Hypertension    Sleep apnea    Past Surgical History:  Procedure Laterality Date   ANAL RECTAL MANOMETRY N/A 11/28/2013   Procedure: ANO RECTAL MANOMETRY;  Surgeon: Romie Levee, MD;  Location: [facility];  Service: Endoscopy;  Laterality: N/A;   APPENDECTOMY     BACK SURGERY     X2    BIOPSY  03/13/2021   Procedure: BIOPSY;  Surgeon: Malissa Hippo, MD;  Location: [facility];  Service: Endoscopy;;   CHOLECYSTECTOMY     COLONOSCOPY  03/29/03   COLONOSCOPY N/A 12/09/2013   Procedure: COLONOSCOPY;  Surgeon: Malissa Hippo, MD;  Location: [facility];  Service: Endoscopy;  Laterality: N/A;  730   COLONOSCOPY N/A 02/04/2017   Procedure: COLONOSCOPY;  Surgeon: Malissa Hippo, MD;  Location: [facility];  Service: Endoscopy;  Laterality: N/A;  930   ESOPHAGEAL DILATION N/A 03/13/2021   Procedure: ESOPHAGEAL DILATION;  Surgeon: Malissa Hippo, MD;  Location: [facility];  Service: Endoscopy;  Laterality: N/A;   ESOPHAGOGASTRODUODENOSCOPY  (EGD) WITH PROPOFOL N/A 03/13/2021   Procedure: ESOPHAGOGASTRODUODENOSCOPY (EGD) WITH PROPOFOL;  Surgeon: Malissa Hippo, MD;  Location: [facility];  Service: Endoscopy;  Laterality: N/A;  8:05   EYE SURGERY     bilateral cataract removal   IR KYPHO LUMBAR INC FX REDUCE BONE BX UNI/BIL CANNULATION INC/IMAGING  11/24/2019   IR KYPHO THORACIC WITH BONE BIOPSY  08/23/2019   IR KYPHO THORACIC WITH BONE BIOPSY  11/24/2019   IR RADIOLOGIST EVAL & MGMT  08/03/2019   IR RADIOLOGIST EVAL & MGMT  09/29/2019   IR RADIOLOGIST EVAL & MGMT  10/18/2019   IR RADIOLOGIST EVAL & MGMT  04/11/2020   SKIN CANCER EXCISION     RIGHT NECK     TONSILLECTOMY     T+A   TOTAL HIP ARTHROPLASTY Right 09/14/2012   Procedure: RIGHT TOTAL HIP ARTHROPLASTY ANTERIOR APPROACH and RIGHT KNEE STEROID INJECTION;  Surgeon: Kathryne Hitch, MD;  Location: [facility];  Service: Orthopedics;  Laterality: Right;   TOTAL HIP ARTHROPLASTY Left 11/05/2012   Procedure: LEFT TOTAL HIP ARTHROPLASTY ANTERIOR APPROACH;  Surgeon: Kathryne Hitch, MD;  Location: [facility];  Service: Orthopedics;  Laterality: Left;   UMBILICAL HERNIA REPAIR  04/26/03  JENKINS   UPPER GASTROINTESTINAL ENDOSCOPY  08/03/2009   NUR   UPPER GASTROINTESTINAL ENDOSCOPY  03/29/2003   EGD ED TCS   Patient Active Problem List   Diagnosis Date Noted   Aspiration into airway 11/28/2021   IBS (irritable bowel syndrome) 11/28/2021   SSBE (short-segment Barrett's esophagus) 06/25/2021   Lip pain 11/01/2020   Claudication (HCC) 08/02/2020   Hearing loss, right 07/16/2020   Seasonal allergies 07/04/2020   Insomnia 07/04/2020   Osteoporosis 07/04/2020   Lumbosacral spondylosis without myelopathy 06/01/2020   Polyarthritis 04/03/2020   Nausea 04/03/2020   Leg swelling 01/31/2020   Chronic diastolic CHF (congestive heart failure) (HCC) 01/31/2020   Abdominal pain 01/10/2020   Stenosis of lateral recess of lumbar spine 12/06/2018   Foraminal stenosis of lumbar  region 12/06/2018   Spondylosis without myelopathy or radiculopathy, lumbar region 12/06/2018   Other idiopathic scoliosis, thoracolumbar region 12/06/2018   Chronic left shoulder pain 12/06/2018   History of colonic polyps 10/22/2016   SBO (small bowel obstruction) (HCC) 03/02/2013   Hyperlipidemia 12/08/2012   Gout 12/08/2012   Dysrhythmia 12/08/2012   Depression 12/08/2012   Asthma 12/08/2012   Fibromyalgia 12/08/2012   Constipation 12/08/2012   Acute posthemorrhagic anemia 12/07/2012   Benign essential hypertension 12/07/2012   Degenerative arthritis of hip, left 11/05/2012   Weakness of right leg 03/24/2011   OA (osteoarthritis) 03/24/2011   Stiffness of cervical spine 03/24/2011   GERD (gastroesophageal reflux disease) 11/19/2010   Spinal stenosis 04/18/2009   HIP PAIN 11/17/2007   DDD (degenerative disc disease), lumbosacral 11/17/2007   ANSERINE BURSITIS, RIGHT 08/11/2007   Sciatica 06/22/2007   Knee pain 03/17/2007    PCP: Ignatius Specking, MD   REFERRING PROVIDER:  Madelyn Brunner, DO   REFERRING DIAG: [phone] (ICD-10-CM) - Chronic pain of right knee  THERAPY DIAG:  Chronic pain of right knee  Muscle weakness (generalized)  Stiffness of right knee, not elsewhere classified  Rationale for Evaluation and Treatment: Rehabilitation  ONSET DATE: progressively worse over the last several months  SUBJECTIVE:   SUBJECTIVE STATEMENT: Pt arriving today reporting being sick for the last 10 days. Pt stating she hasn't felt like doing her HEP.     Per MD note c Dr. Shon Baton:  Recently underwent MRI of the right knee which showed tricompartmental arthritis however end-stage in the lateral compartment with extensive bone marrow edema within the lateral tibial plateau and femoral condyle.  I do think this is where her pain is emanating from as she has high-grade cartilage loss over this area with swelling in the bone that is causing pain.   PERTINENT HISTORY: Anxiety,  asthma, arthritis, CA, depression, fibromyalgia, GERD, HTN, back surgery, appendectomy, eye surgery, skin CA excision, THA Rt, THA left PAIN:  NPRS scale: 6/10 Pain location: Rt knee Pain description: achy, throbbing Aggravating factors: weight bearing, walking, standing Relieving factors: sitting, pain meds  PRECAUTIONS: None  WEIGHT BEARING RESTRICTIONS: No  FALLS:  Has patient fallen in last 6 months? No  LIVING ENVIRONMENT: Lives with: lives with their family and lives with their spouse Lives in: House/apartment Stairs: Yes: External: 6 steps; can reach both Has following equipment at home: Single point cane and Walker - 2 wheeled  OCCUPATION: retired  PLOF: Independent  PATIENT GOALS: walk better without pain  Next MD visit: f/u not currently scheduled   OBJECTIVE:   DIAGNOSTIC FINDINGS:  IMPRESSION: 04/25/22 Tricompartment osteoarthritis, worst in the lateral compartment with focal high-grade cartilage loss along the lateral tibial  plateau, area measuring 4 x 9 mm. Extensive marrow edema within the lateral tibial plateau and also present to a lesser degree in the posterolateral femoral condyle, favored to be related to arthritis. No discrete fracture line identified. Adjacent soft tissue swelling.   Intrasubstance degeneration with free edge fraying of the medial and lateral menisci. No definitive meniscus tear.   Intact cruciate and collateral ligaments.   Moderate size Baker's cyst.    PATIENT SURVEYS:  05/12/22: FOTO intake:  25%  predicted:  43%  COGNITION: 05/12/22 Overall cognitive status: WFL    SENSATION: WFL  EDEMA:  05/12/22 Rt Knee: 45 centimeters              Left Knee: 43 centimeters  06/11/22: Rt knee: 45.5 centimeters  MUSCLE LENGTH: 05/12/22 Hamstrings: Right 68 deg; Left 75 deg   POSTURE:05/12/22 rounded shoulders, forward head, and decreased lumbar lordosis  PALPATION: 05/12/22 TTP: distal patella tendon, lateral posterior knee,  and hamstring tendons  LOWER EXTREMITY ROM:   ROM Right 05/12/22 supine Left 05/12/22 supine  Hip flexion    Hip extension    Hip abduction    Hip adduction    Hip internal rotation    Hip external rotation    Knee flexion A: 135 with pain at end ranges A: 135   Knee extension A: 0 A: 0   (Blank rows = not tested)  LOWER EXTREMITY MMT:  MMT Right 05/12/22 Left 05/12/22  Hip flexion 4 4+  Hip extension 4 4+  Hip abduction 4 4+  Hip adduction 4 4+  Hip internal rotation    Hip external rotation    Knee flexion 4 5  Knee extension 4 5   (Blank rows = not tested)    FUNCTIONAL TESTS:  05/12/22: 5 time sit to stand:  26 seconds No UE support 06/11/22: 5 time sit to stand: 31 seconds No UE support  GAIT: 05/12/22 Distance walked: 30 feet level surface Assistive device utilized: Single point cane Level of assistance: Modified independence Comments: valgus knee on Rt, bilateral pes planus   TODAY'S TREATMENT                                                                          DATE: 06/11/22:  TherEx:  Nustep: Level 4 LE only: x 6 minutes Standing: calf raises x 15 Standing: hip abd x 10 each LE Standing hamstring curls: x 10 each LE Standing hip extension: x 10 each LE  LAQ: x 15 holding 3 seconds (All standing exercises performed with bil UE support) Neuromuscular Re-ed:  Standing on Airex: feet apart, feet together, staggered stance c each LE, all x 30 sec with SBA to CGA Sit to stand s UE support x 5  We discussed purchasing thigh high compression stockings to help control swelling in pt's knees.       05/26/22:  TherEx:  Nustep: Level 4 LE only: x 6 minutes Standing: calf raises x 15 Standing: hip abd x 10 each LE Standing hamstring curls: x 10 each LE Supine: quad sets: 2 x 10, holding 5 sec Supine: SLR: x 10 on Rt LE Supine Bridge: x 10 holding 5 sec Neuromuscular Re-ed:  Standing stagger stance x 30  sec on level surface with close supervision  and finger tap support as needed Standing feet together x 30 sec on level surface with close supervision and finger tap support as needed      05/19/22:  TherEx:  Nustep: Level 5 LE only: x 8  minutes Calf stretch on slant board 2 x 30 sec c UE support Step ups on 6 inch step x 10 leading with Rt c single UE LAQ: x 10 holding 3 sec Supine: quad sets: 2 x 10, holding 5 sec Supine: SLR: x 10 on Rt LE Supine Bridge: x 10 holding 5 sec Supine clam shells Red TB x 10 Neuromuscular Re-ed:  Airex 2 x 30 sec feet apart, 2 x 30sec  feet together, x 30 sec with staggered stance c each LE Rocker board: x 2 minute forward/back on fitter fit 2 -point    PATIENT EDUCATION:  Education details: HEP, POC Person educated: Patient Education method: Programmer, multimedia, Demonstration, Verbal cues, and Handouts Education comprehension: verbalized understanding, returned demonstration, and verbal cues required  HOME EXERCISE PROGRAM: Access Code: [email or web address] URL: [email or web address] Date: 05/26/2022 Prepared by: Narda Amber  Exercises - Supine Bridge  - 2 x daily - 7 x weekly - 10 reps - 5 seconds hold - Supine Active Straight Leg Raise  - 2 x daily - 7 x weekly - 10 reps - Supine Quad Set  - 2 x daily - 7 x weekly - 2 sets - 10 reps - 5 seconds hold - Hooklying Clamshell with Resistance  - 2 x daily - 7 x weekly - 2 sets - 10 reps - 5 seconds hold - Heel Raises with Counter Support  - 1-2 x daily - 7 x weekly - 2 sets - 10 reps - Standing Knee Flexion AROM with Chair Support  - 1-2 x daily - 7 x weekly - 2 sets - 10 reps - Standing Hip Abduction with Counter Support  - 1-2 x daily - 7 x weekly - 2 sets - 10 reps  ASSESSMENT:  CLINICAL IMPRESSION: Pt arriving today still reporting bil knee pain with the right worse than left. Pt with increased edema noted today compared to her last measurement. Pt recovering from recent flu like sickness and was limited today by general fatigue.  Continue skilled PT to maximize pt's function.   OBJECTIVE IMPAIRMENTS: decreased activity tolerance, decreased balance, decreased mobility, difficulty walking, decreased ROM, increased edema, and pain.   ACTIVITY LIMITATIONS: standing, squatting, sleeping, stairs, and transfers  PARTICIPATION LIMITATIONS: cleaning and community activity  PERSONAL FACTORS: 3+ comorbidities: see pertinent history above  are also affecting patient's functional outcome.   REHAB POTENTIAL: Fair based on age and other co-morbidities  CLINICAL DECISION MAKING: Stable/uncomplicated  EVALUATION COMPLEXITY: Low   GOALS: Goals reviewed with patient? Yes  SHORT TERM GOALS: (target date for Short term goals are 3 weeks 06/06/22)   1.  Patient will demonstrate independent use of home exercise program to maintain progress from in clinic treatments.  Goal status: New  LONG TERM GOALS: (target dates for all long term goals are 10 weeks  07/25/22 )   1. Patient will demonstrate/report pain at worst less than or equal to 2/10 to facilitate minimal limitation in daily activity secondary to pain symptoms.  Goal status: New   2. Patient will demonstrate independent use of home exercise program to facilitate ability to maintain/progress functional gains from skilled physical therapy services.  Goal status: New   3. Patient will demonstrate FOTO outcome >  or = 43 % to indicate reduced disability due to condition.  Goal status: New   4.  Patient will demonstrate right LE MMT 5/5 throughout to faciltiate usual transfers, stairs, squatting at [facility] for daily life.   Goal status: New   5.  Patient will be able to amb community distances with normalized gait pattern with pain </= 2/10.  Goal status: New   6.  Pt will improve her 5 time sit to stand to </= 16 seconds to improve functional mobility.  Goal status: New  7. Pt will be able to navigate 6 steps with single hand rail with pain </= 2/10 in Rt knee.   Goal  Status: New      PLAN:  PT FREQUENCY: 1-2x/week  PT DURATION: 10 weeks  PLANNED INTERVENTIONS: Therapeutic exercises, Therapeutic activity, Neuro Muscular re-education, Balance training, Gait training, Patient/Family education, Joint mobilization, Stair training, DME instructions, Dry Needling, Electrical stimulation, Traction, Cryotherapy, vasopneumatic deviceMoist heat, Taping, Ultrasound, Ionotophoresis 4mg /ml Dexamethasone, and aquatic therapy, Manual therapy.  All included unless contraindicated  PLAN FOR NEXT SESSION:  Continue with LE strengthening, Nustep, standing exercises and balance as tolerated   Sharmon Leyden, PT, MPT 06/11/2022, 8:28 AM    .PHYSICAL THERAPY DISCHARGE SUMMARY  Visits from Start of Care: 4  Current functional level related to goals / functional outcomes: See above   Remaining deficits: See above   Education / Equipment: HEP   Patient agrees to discharge. Patient goals were not met. Patient is being discharged due to  unable to return for visits.  Pt requested her HEP to be mailed to her. An updated HEP based on the exercises performed during out treatment sessions was mailed to pt.  See below HEP.   Access Code: [email or web address] URL: [email or web address] Date: 07/02/2022 Prepared by: Narda Amber  Exercises - Supine Bridge  - 2 x daily - 7 x weekly - 10 reps - 5 seconds hold - Supine Active Straight Leg Raise  - 2 x daily - 7 x weekly - 10 reps - Supine Quad Set  - 2 x daily - 7 x weekly - 10 reps - 5 seconds hold - Hooklying Clamshell with Resistance  - 2 x daily - 7 x weekly - 10 reps - 5 seconds hold - Heel Raises with Counter Support  - 1-2 x daily - 7 x weekly - 10 reps - Standing Knee Flexion AROM with Chair Support  - 1-2 x daily - 7 x weekly - 10 reps - Standing Hip Abduction with Counter Support  - 1-2 x daily - 7 x weekly - 10 reps - Standing Hip Extension with Counter Support  - 1-2 x daily - 7 x weekly - 10  reps - Sit to Stand with Counter Support  - 1-2 x daily - 7 x weekly - 10 reps - Seated Long Arc Quad  - 1-2 x daily - 7 x weekly - 10 reps  Narda Amber, PT, MPT 07/02/22 10:01 AM

## 2022-06-16 NOTE — Progress Notes (Signed)
Zoe [name], female    DOB: September 14, 1935    MRN: [id number]   Brief patient profile:  56  yowf  quit smoking Aug 2022 due to cough/wheezing but very little need for albuterol  referred to pulmonary clinic in [facility]  06/17/2022 by Zoe [name]  for copd eval.      History of Present Illness  06/17/2022  Pulmonary/ 1st office eval/ Zoe [name] / [name] on Advair or Trelegy and can't tell much difference between the two Chief Complaint  Patient presents with   Pulmonary Consult    Referred by Zoe [name] for eval of COPD. Pt c/o cough for years. She has been coughing up large amounts of yellow to green sputum over the past 2-3 months.   Dyspnea:  pushes cart at [employer] /uses [facility] due to back / not breathing  Cough: yellowish / green off and on for months  Sleep: bed is flat  2 choking spells "though I was going to die"  in last few months prior to OV  resolved s heimlich "felt like spit was stuck" - takes otc ppi q d pc  SABA use: hfa every few days  02: none   No obvious day to day or daytime pattern/variability or assoc  mucus plugs or hemoptysis or cp or chest tightness, subjective wheeze or overt sinus   symptoms.    Also denies any obvious fluctuation of symptoms with weather or environmental changes or other aggravating or alleviating factors except as outlined above   No unusual exposure hx or h/o childhood pna/ asthma or knowledge of premature birth.  Current Allergies, Complete Past Medical History, Past Surgical History, Family History, and Social History were reviewed in [employer] record.  ROS  The following are not active complaints unless bolded Hoarseness, sore throat, dysphagia/globus, dental problems, itching, sneezing,  nasal congestion or discharge of excess mucus or purulent secretions, ear ache,   fever, chills, sweats, unintended wt loss or wt gain, classically pleuritic or exertional cp,  orthopnea pnd or arm/hand swelling  or leg  swelling, presyncope, palpitations, abdominal pain, anorexia, nausea, vomiting, diarrhea  or change in bowel habits or change in bladder habits, change in stools or change in urine, dysuria, hematuria,  rash, arthralgias, visual complaints, headache, numbness, weakness or ataxia or problems with walking or coordination,  change in mood or  memory.             Past Medical History:  Diagnosis Date   Abdominal discomfort 11/02/2012   suspected ?UTI- PCP MD placed on Cipro   Anxiety    Arthritis    Asthma    Cancer (HCC)    skin CA   Chronic constipation    Depression    Dysrhythmia    tachycardia   Environmental allergies    Fibromyalgia    GERD (gastroesophageal reflux disease)    Hemorrhoids    Hypercholesteremia    Hypertension    Sleep apnea     Outpatient Medications Prior to Visit  Medication Sig Dispense Refill   ADVAIR DISKUS 250-50 MCG/DOSE AEPB Inhale 1 puff into the lungs daily as needed (asthma).     albuterol (PROVENTIL HFA;VENTOLIN HFA) 108 (90 BASE) MCG/ACT inhaler Inhale 2 puffs into the lungs every 6 (six) hours as needed for wheezing or shortness of breath.     allopurinol (ZYLOPRIM) 300 MG tablet Take 1 tablet (300 mg total) by mouth daily. 90 tablet 1   ALPRAZolam (XANAX) 0.5 MG tablet  Take 1 tablet (0.5 mg total) by mouth 2 (two) times daily. (Patient taking differently: Take 0.5 mg by mouth 2 (two) times daily as needed for anxiety.) 60 tablet 0   aspirin 81 MG tablet Take 1 tablet (81 mg total) by mouth daily. 30 tablet    Biotin 16109 MCG TABS Take 10,000 mcg by mouth daily.     calcium-vitamin D 250-100 MG-UNIT tablet Take 1 tablet by mouth 2 (two) times daily.     diclofenac Sodium (VOLTAREN) 1 % GEL Apply 2-4 g topically 4 (four) times daily. APPLY 2-4 GRAMS TOPICALLY FOUR TIMES DAILY Strength: 1 % 200 g 3   digoxin (LANOXIN) 0.125 MG tablet Take 125 mcg by mouth daily.     dorzolamide (TRUSOPT) 2 % ophthalmic solution 1 drop 2 (two) times daily.      doxepin (SINEQUAN) 100 MG capsule Take 1 capsule (100 mg total) by mouth at bedtime. 90 capsule 1   gabapentin (NEURONTIN) 300 MG capsule Take 1 capsule (300 mg total) by mouth 3 (three) times daily. 180 capsule 1   lubiprostone (AMITIZA) 24 MCG capsule Take 1 capsule (24 mcg total) by mouth 2 (two) times daily with a meal. 180 capsule 3   Multiple Vitamin (MULTIVITAMIN) tablet Take 1 tablet by mouth daily.     omeprazole (PRILOSEC) 20 MG capsule Take 20 mg by mouth daily.     senna (SENOKOT) 8.6 MG tablet Take 5 tablets (43 mg total) by mouth daily. (Patient taking differently: Take 5 tablets by mouth 2 (two) times daily.)     acetaminophen (TYLENOL) 500 MG tablet Take 500 mg by mouth. 3 -5 per day     levocetirizine (XYZAL) 5 MG tablet One half daily     TRELEGY ELLIPTA 100-62.5-25 MCG/ACT AEPB Take 1 puff by mouth daily.     No facility-administered medications prior to visit.     Objective:     BP 118/66 (BP Location: Left Arm, Cuff Size: Normal)   Pulse 70   Ht 5' 7.5" (1.715 m)   Wt 171 lb (77.6 kg)   SpO2 93% Comment: on RA  BMI 26.39 kg/m   SpO2: 93 % (on RA)  Amb pleasant elderly wf with vigorous throat celaring / dry quality cough    HEENT : Oropharynx  clear   Nasal turbinates mild nonspecific edema    NECK :  without  apparent JVD/ palpable Nodes/TM    LUNGS: no acc muscle use,  Min barrel  contour chest wall with bilateral  slightly decreased bs s audible wheeze and  without cough on insp or exp maneuvers and min  Hyperresonant  to  percussion bilaterally    CV:  RRR  no s3 or murmur or increase in P2, and no edema   ABD:  soft and nontender with pos end  insp Hoover's  in the supine position.  No bruits or organomegaly appreciated   MS:  Nl gait/ ext warm without deformities Or obvious joint restrictions  calf tenderness, cyanosis or clubbing     SKIN: warm and dry without lesions    NEURO:  alert, approp, nl sensorium with  no motor or cerebellar deficits  apparent.        CXR PA and Lateral:   06/17/2022 :    I personally reviewed images and agree with radiology impression as follows:    2.5 cm suspicious nodule within the right mid lung. Recommend further evaluation with chest CT.    Assessment  Asthmatic bronchitis Quit smoking 2022 due to cough/ wheeze worse since then  - 06/17/2022  After extensive coaching inhaler device,  effectiveness =  75% with hfa so try change dpi to symb 80 2bid as main problem is cough / choking / globus sensation and rx max gerd x 6 week trial   The choking sensation and globus strongly support Upper airway cough syndrome (previously labeled PNDS),  is so named because it's frequently impossible to sort out how much is  CR/sinusitis with freq throat clearing (which can be related to primary GERD)   vs  causing  secondary (" extra esophageal")  GERD from wide swings in gastric pressure that occur with throat clearing, often  promoting self use of mint and menthol lozenges that reduce the lower esophageal sphincter tone and exacerbate the problem further in a cyclical fashion.   These are the same pts (now being labeled as having "irritable larynx syndrome" by some cough centers) who not infrequently have a history of having failed to tolerate [name] inhibitors,  dry powder inhalers(esp advair, and it's dose dependent)  or biphosphonates or report having atypical/extraesophageal reflux symptoms that don't respond to standard doses of PPI  and are easily confused as having aecopd or asthma flares by even experienced allergists/ pulmonologists (myself included).   I suspect she has relatively mild copd/ asthma at this point so try rx max gerd and milder ICS in hfa form = symbicort 80 2bid (sometimes less does more in this setting) and f/u in 6 weeks with all meds in hand using a trust but verify approach to confirm accurate Medication  Reconciliation The principal here is that until we are certain that the  patients are doing  what we've asked, it makes no sense to ask them to do more.     Solitary pulmonary nodule Quit smoking 08/2020  x 2.5 cm R mid lung likely RUL on lateral view - CT chest s contrast ordered   It could be rounded infection as she does report purulent sputum so rec omnicef 300 mg bid x 10days while awaiting CT  Discussed in detail all the  indications, usual  risks and alternatives  relative to the benefits with patient who agrees to proceed with w/u as outlined.            Each maintenance medication was reviewed in detail including emphasizing most importantly the difference between maintenance and prns and under what circumstances the prns are to be triggered using an action plan format where appropriate.  Total time for H and P, chart review, counseling, reviewing hfa device(s) and generating customized AVS unique to this office visit / same day charting = 60 min new pt eval          Sandrea Hughs, MD 06/17/2022

## 2022-06-17 ENCOUNTER — Ambulatory Visit (HOSPITAL_COMMUNITY)
Admission: RE | Admit: 2022-06-17 | Discharge: 2022-06-17 | Disposition: A | Discharge status: Home / Self Care | Payer: HMO | Source: Ambulatory Visit | Attending: Internal Medicine | Admitting: Internal Medicine

## 2022-06-17 ENCOUNTER — Ambulatory Visit (INDEPENDENT_AMBULATORY_CARE_PROVIDER_SITE_OTHER): Payer: HMO | Admitting: Internal Medicine

## 2022-06-17 ENCOUNTER — Encounter: Payer: Self-pay | Admitting: Internal Medicine

## 2022-06-17 VITALS — BP 118/66 | HR 70 | Ht 67.5 in | Wt 171.0 lb

## 2022-06-17 DIAGNOSIS — J4530 Mild persistent asthma, uncomplicated: Secondary | ICD-10-CM | POA: Diagnosis not present

## 2022-06-17 DIAGNOSIS — R911 Solitary pulmonary nodule: Secondary | ICD-10-CM | POA: Diagnosis not present

## 2022-06-17 DIAGNOSIS — R059 Cough, unspecified: Secondary | ICD-10-CM | POA: Diagnosis not present

## 2022-06-17 DIAGNOSIS — M810 Age-related osteoporosis without current pathological fracture: Secondary | ICD-10-CM | POA: Diagnosis not present

## 2022-06-17 MED ORDER — BUDESONIDE-FORMOTEROL FUMARATE 80-4.5 MCG/ACT IN AERO
INHALATION_SPRAY | RESPIRATORY_TRACT | 12 refills | Status: DC
Start: 2022-06-17 — End: 2022-09-17

## 2022-06-17 MED ORDER — FAMOTIDINE 20 MG PO TABS
ORAL_TABLET | ORAL | 11 refills | Status: DC
Start: 2022-06-17 — End: 2022-09-17

## 2022-06-17 MED ORDER — PANTOPRAZOLE SODIUM 40 MG PO TBEC
40.0000 mg | DELAYED_RELEASE_TABLET | Freq: Every day | ORAL | 2 refills | Status: DC
Start: 2022-06-17 — End: 2022-09-17

## 2022-06-17 NOTE — Patient Instructions (Addendum)
Stop advair/ trelegy (powder inhalers)   Plan A = Automatic = Always=    symbicort 80 Take 2 puffs first thing in am and then another 2 puffs about 12 hours later.    Work on inhaler technique:  relax and gently blow all the way out then take a nice smooth full deep breath back in, triggering the inhaler at same time you start breathing in.  Hold breath in for at least  5 seconds if you can. Blow out symbicort 80  thru nose. Rinse and gargle with water when done.  If mouth or throat bother you at all,  try brushing teeth/gums/tongue with arm and hammer toothpaste/ make a slurry and gargle and spit out.     Plan B = Backup (to supplement plan A, not to replace it) Only use your albuterol inhaler as a rescue medication to be used if you can't catch your breath by resting or doing a relaxed purse lip breathing pattern.  - The less you use it, the better it will work when you need it. - Ok to use the inhaler up to 2 puffs  every 4 hours if you must but call for appointment if use goes up over your usual need - Don't leave home without it !!  (think of it like the spare tire for your car)     Pantoprazole (protonix) 40 mg   Take  30-60 min before first meal of the day and Pepcid (famotidine)  20 mg after supper until return to office - this is the best way to tell whether stomach acid is contributing to your problem.    GERD (REFLUX)  is an extremely common cause of respiratory symptoms just like yours , many times with no obvious heartburn at all.    It can be treated with medication, but also with lifestyle changes including elevation of the head of your bed (ideally with 6 -8inch blocks under the headboard of your bed),  Smoking cessation, avoidance of late meals, excessive alcohol, and avoid fatty foods, chocolate, peppermint, colas, red wine, and acidic juices such as orange juice.  NO MINT OR MENTHOL PRODUCTS SO NO COUGH DROPS  USE SUGARLESS CANDY INSTEAD (Jolley ranchers or Stover's or Life  Savers) or even ice chips will also do - the key is to swallow to prevent all throat clearing. NO OIL BASED VITAMINS - use powdered substitutes.  Avoid fish oil when coughing.    Please remember to go to the  x-ray department  @  [facility] for your tests - we will call you with the results when they are available     Please schedule a follow up office visit in 6 weeks, call sooner if needed with all respiratory medications /inhalers/ solutions in hand so we can verify exactly what you are taking. This includes all medications from all doctors and over the counters   Late add RUL nodule rx omnicef x 10 d then ct at 2 weeks

## 2022-06-18 ENCOUNTER — Other Ambulatory Visit: Payer: Self-pay

## 2022-06-18 ENCOUNTER — Encounter: Payer: Self-pay | Admitting: Internal Medicine

## 2022-06-18 ENCOUNTER — Encounter: Payer: Self-pay | Admitting: Family Medicine

## 2022-06-18 DIAGNOSIS — M810 Age-related osteoporosis without current pathological fracture: Secondary | ICD-10-CM

## 2022-06-18 LAB — PROTEIN ELECTROPHORESIS, SERUM

## 2022-06-18 LAB — PARATHYROID HORMONE, INTACT (NO CA): PTH: 35 pg/mL (ref 15–65)

## 2022-06-18 MED ORDER — VITAMIN D (ERGOCALCIFEROL) 1.25 MG (50000 UNIT) PO CAPS: 50000.0000 [IU] | ORAL_CAPSULE | ORAL | 0 refills | Status: DC

## 2022-06-18 NOTE — Assessment & Plan Note (Addendum)
Quit smoking 2022 due to cough/ wheeze worse since then  - 06/17/2022  After extensive coaching inhaler device,  effectiveness =  75% with hfa so try change dpi to symb 80 2bid as main problem is cough / choking / globus sensation and rx max gerd x 6 week trial   The choking sensation and globus strongly support Upper airway cough syndrome (previously labeled PNDS),  is so named because it's frequently impossible to sort out how much is  CR/sinusitis with freq throat clearing (which can be related to primary GERD)   vs  causing  secondary (" extra esophageal")  GERD from wide swings in gastric pressure that occur with throat clearing, often  promoting self use of mint and menthol lozenges that reduce the lower esophageal sphincter tone and exacerbate the problem further in a cyclical fashion.   These are the same pts (now being labeled as having "irritable larynx syndrome" by some cough centers) who not infrequently have a history of having failed to tolerate ace inhibitors,  dry powder inhalers(esp advair, and it's dose dependent)  or biphosphonates or report having atypical/extraesophageal reflux symptoms that don't respond to standard doses of PPI  and are easily confused as having aecopd or asthma flares by even experienced allergists/ pulmonologists (myself included).   I suspect Zoe [name] has relatively mild copd/ asthma at this point so try rx max gerd and milder ICS in hfa form = symbicort 80 2bid (sometimes less does more in this setting) and f/u in 6 weeks with all meds in hand using a trust but verify approach to confirm accurate Medication  Reconciliation The principal here is that until we are certain that the  patients are doing what we've asked, it makes no sense to ask them to do more.

## 2022-06-19 DIAGNOSIS — H40013 Open angle with borderline findings, low risk, bilateral: Secondary | ICD-10-CM | POA: Diagnosis not present

## 2022-06-19 DIAGNOSIS — H532 Diplopia: Secondary | ICD-10-CM | POA: Diagnosis not present

## 2022-06-19 DIAGNOSIS — H353132 Nonexudative age-related macular degeneration, bilateral, intermediate dry stage: Secondary | ICD-10-CM | POA: Diagnosis not present

## 2022-06-19 DIAGNOSIS — H04123 Dry eye syndrome of bilateral lacrimal glands: Secondary | ICD-10-CM | POA: Diagnosis not present

## 2022-06-19 DIAGNOSIS — H524 Presbyopia: Secondary | ICD-10-CM | POA: Diagnosis not present

## 2022-06-20 LAB — PROTEIN ELECTROPHORESIS, SERUM
Albumin ELP: 3.7 g/dL (ref 2.9–4.4)
Alpha 1: 0.3 g/dL (ref 0.0–0.4)
Alpha 2: 0.8 g/dL (ref 0.4–1.0)
Beta: 1.2 g/dL (ref 0.7–1.3)
Gamma Globulin: 0.8 g/dL (ref 0.4–1.8)
Globulin, Total: 3.1 g/dL (ref 2.2–3.9)
Total Protein: 6.8 g/dL (ref 6.0–8.5)

## 2022-06-20 LAB — VITAMIN D 25 HYDROXY (VIT D DEFICIENCY, FRACTURES): Vit D, 25-Hydroxy: 18.3 ng/mL — ABNORMAL LOW (ref 30.0–100.0)

## 2022-06-22 DIAGNOSIS — R911 Solitary pulmonary nodule: Secondary | ICD-10-CM | POA: Insufficient documentation

## 2022-06-22 MED ORDER — CEFDINIR 300 MG PO CAPS
300.0000 mg | ORAL_CAPSULE | Freq: Two times a day (BID) | ORAL | 0 refills | Status: DC
Start: 2022-06-22 — End: 2022-09-17

## 2022-06-22 NOTE — Assessment & Plan Note (Signed)
Quit smoking 08/2020  x 2.5 cm R mid lung likely RUL on lateral view - CT chest s contrast ordered   It could be rounded infection as she does report purulent sputum so rec omnicef 300 mg bid x 10days while awaiting CT  Discussed in detail all the  indications, usual  risks and alternatives  relative to the benefits with patient who agrees to proceed with w/u as outlined.            Each maintenance medication was reviewed in detail including emphasizing most importantly the difference between maintenance and prns and under what circumstances the prns are to be triggered using an action plan format where appropriate.  Total time for H and P, chart review, counseling, reviewing hfa device(s) and generating customized AVS unique to this office visit / same day charting = 60 min new pt eval

## 2022-06-24 ENCOUNTER — Telehealth: Payer: Self-pay | Admitting: Internal Medicine

## 2022-06-24 DIAGNOSIS — R911 Solitary pulmonary nodule: Secondary | ICD-10-CM

## 2022-06-24 NOTE — Telephone Encounter (Signed)
Called and spoke with Staci. She was calling for a CXR that the patient completed last week. MW has already viewed the report.    Called patient with MW's result note but she did not answer. Left message for her to call us back.

## 2022-06-24 NOTE — Telephone Encounter (Signed)
Pt states she is calling back from missed call

## 2022-06-24 NOTE — Telephone Encounter (Signed)
Called patient again, she did not answer. Left message for her to call us back.  

## 2022-06-24 NOTE — Telephone Encounter (Signed)
Call Report  

## 2022-06-25 ENCOUNTER — Encounter: Payer: HMO | Admitting: Physical Therapy

## 2022-06-25 ENCOUNTER — Ambulatory Visit (HOSPITAL_BASED_OUTPATIENT_CLINIC_OR_DEPARTMENT_OTHER)
Admission: RE | Admit: 2022-06-25 | Discharge: 2022-06-25 | Disposition: A | Discharge status: Home / Self Care | Payer: HMO | Source: Ambulatory Visit | Attending: Family Medicine | Admitting: Family Medicine

## 2022-06-25 DIAGNOSIS — M810 Age-related osteoporosis without current pathological fracture: Secondary | ICD-10-CM | POA: Diagnosis not present

## 2022-06-26 NOTE — Telephone Encounter (Signed)
Ok to try symbicort 160 one bid generic is fine

## 2022-06-26 NOTE — Telephone Encounter (Signed)
Patient cannot afford the name brand of Symbicort, but patient would like the generic  Patient would like to get a dosage of 160 instead of 80 to take one puff twice daily. Please call and advise patient.

## 2022-06-26 NOTE — Telephone Encounter (Signed)
[facility] presented to [facility] front desk asking for advice - Dr.Wert please see below tele encounter from [facility] regarding pt needs

## 2022-06-27 MED ORDER — BUDESONIDE-FORMOTEROL FUMARATE 160-4.5 MCG/ACT IN AERO: 1.0000 | INHALATION_SPRAY | Freq: Two times a day (BID) | RESPIRATORY_TRACT | 6 refills | Status: DC

## 2022-06-27 NOTE — Telephone Encounter (Signed)
Called and spoke w/ pt also provided her with results of her CXR. Let her know the abx per MW had already been sent to [facility] pharmacy.   Placed order for CT. NFN att.

## 2022-06-30 ENCOUNTER — Encounter: Payer: HMO | Admitting: Physical Therapy

## 2022-07-02 ENCOUNTER — Encounter: Payer: HMO | Admitting: Physical Therapy

## 2022-07-09 ENCOUNTER — Encounter: Payer: HMO | Admitting: Physical Therapy

## 2022-07-10 ENCOUNTER — Ambulatory Visit (HOSPITAL_COMMUNITY): Payer: HMO

## 2022-07-11 ENCOUNTER — Encounter: Payer: Self-pay | Admitting: *Deleted

## 2022-07-14 ENCOUNTER — Ambulatory Visit (HOSPITAL_COMMUNITY): Payer: HMO

## 2022-07-16 ENCOUNTER — Encounter: Payer: HMO | Admitting: Physical Therapy

## 2022-07-17 DIAGNOSIS — Z299 Encounter for prophylactic measures, unspecified: Secondary | ICD-10-CM | POA: Diagnosis not present

## 2022-07-17 DIAGNOSIS — K5900 Constipation, unspecified: Secondary | ICD-10-CM | POA: Diagnosis not present

## 2022-07-17 DIAGNOSIS — N390 Urinary tract infection, site not specified: Secondary | ICD-10-CM | POA: Diagnosis not present

## 2022-07-24 ENCOUNTER — Telehealth: Payer: Self-pay | Admitting: Internal Medicine

## 2022-07-24 NOTE — Telephone Encounter (Signed)
Pt is req CT without Contrast

## 2022-07-25 ENCOUNTER — Other Ambulatory Visit: Payer: Self-pay

## 2022-07-25 ENCOUNTER — Ambulatory Visit (HOSPITAL_COMMUNITY)
Admission: RE | Admit: 2022-07-25 | Discharge: 2022-07-25 | Disposition: A | Discharge status: Home / Self Care | Payer: HMO | Source: Ambulatory Visit | Attending: Internal Medicine | Admitting: Internal Medicine

## 2022-07-25 DIAGNOSIS — R911 Solitary pulmonary nodule: Secondary | ICD-10-CM | POA: Insufficient documentation

## 2022-07-25 DIAGNOSIS — J929 Pleural plaque without asbestos: Secondary | ICD-10-CM | POA: Diagnosis not present

## 2022-07-25 DIAGNOSIS — J432 Centrilobular emphysema: Secondary | ICD-10-CM | POA: Diagnosis not present

## 2022-07-25 DIAGNOSIS — M810 Age-related osteoporosis without current pathological fracture: Secondary | ICD-10-CM | POA: Diagnosis not present

## 2022-07-25 DIAGNOSIS — I700 Atherosclerosis of aorta: Secondary | ICD-10-CM | POA: Diagnosis not present

## 2022-07-25 DIAGNOSIS — R918 Other nonspecific abnormal finding of lung field: Secondary | ICD-10-CM | POA: Diagnosis not present

## 2022-07-25 NOTE — Telephone Encounter (Signed)
Called radiology scheduling number given for [facility], per message received. I spoke with Thereasa Distance, who stated they did not have a [place] and he transferred call to [facility] CT.  I spoke with Ladona Ridgel, who does not know who Marsh Dolly is,  and she was unaware of any calls being placed, or changes needed. Patient is currently scheduled CT chest w/ contrast at 1330 today.

## 2022-07-25 NOTE — Telephone Encounter (Signed)
Called and spoke w/ Ladona Ridgel letting her know that the order has been placed for pt to have the CT w/o contrast today @ 1330. Ladona Ridgel verbalized that she will switch orders.   Called and LVM for pt letting her know that I was able to get it switched.

## 2022-07-26 LAB — VITAMIN D 25 HYDROXY (VIT D DEFICIENCY, FRACTURES): Vit D, 25-Hydroxy: 38.1 ng/mL (ref 30.0–100.0)

## 2022-07-28 ENCOUNTER — Other Ambulatory Visit: Payer: Self-pay | Admitting: Family Medicine

## 2022-07-28 NOTE — Progress Notes (Signed)
Zoe [name], female    DOB: October 08, 1935    MRN: [id number]   Brief patient profile:  64  yowf  quit smoking Aug 2022 due to cough/wheezing but very little need for albuterol  referred to pulmonary clinic in [facility]  06/17/2022 by Dr Sherril Croon  for copd eval.   History of Present Illness  06/17/2022  Pulmonary/ 1st office eval/ [name] / [name] on Advair or Trelegy and can't tell much difference between the two Chief Complaint  Patient presents with   Pulmonary Consult    Referred by Dr. Doreen Beam for eval of COPD. Pt c/o cough for years. She has been coughing up large amounts of yellow to green sputum over the past 2-3 months.   Dyspnea:  pushes cart at [employer] /uses [facility] due to back / not breathing  Cough: yellowish / green off and on for months  Sleep: bed is flat  2 choking spells "though I was going to die"  in last few months prior to OV  resolved s heimlich "felt like spit was stuck" - takes otc ppi q d pc  SABA use: hfa every few days  02: none  Rec Stop advair/ trelegy (powder inhalers)  Plan A = Automatic = Always=    symbicort 80 Take 2 puffs first thing in am and then another 2 puffs about 12 hours later.  Work on inhaler technique: Plan B = Backup (to supplement plan A, not to replace it) Only use your albuterol inhaler as a rescue medication Pantoprazole (protonix) 40 mg   Take  30-60 min before first meal of the day and Pepcid (famotidine)  20 mg after supper until return to office  GERD diet reviewed, bed blocks rec  Please schedule a follow up office visit in 6 weeks, call sooner if needed with all respiratory medications /inhalers/ solutions in hand    Late add RUL nodule rx omnicef x 10 d then ct at 2 weeks   07/29/2022  f/u ov/[place] office/[name] re: COPD gold ? / AB  maint on symbicort     f/u spn / did not bring meds   ? On 160 symb vs 80 Chief Complaint  Patient presents with   Follow-up  Dyspnea:  about the same  Cough: non productive  cough - says   trelegy helped cough up mucus better  Sleeping: bed is flat, no more choking spells  SABA use: rarely  02: none     No obvious day to day or daytime variability or assoc excess/ purulent sputum or mucus plugs or hemoptysis or cp or chest tightness, subjective wheeze or overt sinus or hb symptoms.   Sleeping better  without nocturnal  or early am exacerbation  of respiratory  c/o's or need for noct saba. Also denies any obvious fluctuation of symptoms with weather or environmental changes or other aggravating or alleviating factors except as outlined above   No unusual exposure hx or h/o childhood pna/ asthma or knowledge of premature birth.  Current Allergies, Complete Past Medical History, Past Surgical History, Family History, and Social History were reviewed in [employer] record.  ROS  The following are not active complaints unless bolded Hoarseness, sore throat= globus sensation , dysphagia, dental problems, itching, sneezing,  nasal congestion or discharge of excess mucus or purulent secretions, ear ache,   fever, chills, sweats, unintended wt loss or wt gain, classically pleuritic or exertional cp,  orthopnea pnd or arm/hand swelling  or leg swelling,  presyncope, palpitations, abdominal pain, anorexia, nausea, vomiting, diarrhea  or change in bowel habits or change in bladder habits, change in stools or change in urine, dysuria, hematuria,  rash, arthralgias, visual complaints, headache, numbness, weakness or ataxia or problems with walking or coordination,  change in mood or  memory.        Current Meds  Medication Sig   acetaminophen (TYLENOL) 500 MG tablet Take 500 mg by mouth. 3 -5 per day   albuterol (PROVENTIL HFA;VENTOLIN HFA) 108 (90 BASE) MCG/ACT inhaler Inhale 2 puffs into the lungs every 6 (six) hours as needed for wheezing or shortness of breath.   allopurinol (ZYLOPRIM) 300 MG tablet Take 1 tablet (300 mg total) by mouth daily.   ALPRAZolam (XANAX)  0.5 MG tablet Take 1 tablet (0.5 mg total) by mouth 2 (two) times daily. (Patient taking differently: Take 0.5 mg by mouth 2 (two) times daily as needed for anxiety.)   aspirin 81 MG tablet Take 1 tablet (81 mg total) by mouth daily.   Biotin 16109 MCG TABS Take 10,000 mcg by mouth daily.   budesonide-formoterol (SYMBICORT) 160-4.5 MCG/ACT inhaler Inhale 1 puff into the lungs 2 (two) times daily.   budesonide-formoterol (SYMBICORT) 80-4.5 MCG/ACT inhaler Take 2 puffs first thing in am and then another 2 puffs about 12 hours later.   calcium-vitamin D 250-100 MG-UNIT tablet Take 1 tablet by mouth 2 (two) times daily.   cefdinir (OMNICEF) 300 MG capsule Take 1 capsule (300 mg total) by mouth 2 (two) times daily.   diclofenac Sodium (VOLTAREN) 1 % GEL Apply 2-4 g topically 4 (four) times daily. APPLY 2-4 GRAMS TOPICALLY FOUR TIMES DAILY Strength: 1 %   digoxin (LANOXIN) 0.125 MG tablet Take 125 mcg by mouth daily.   dorzolamide (TRUSOPT) 2 % ophthalmic solution 1 drop 2 (two) times daily.   doxepin (SINEQUAN) 100 MG capsule Take 1 capsule (100 mg total) by mouth at bedtime.   famotidine (PEPCID) 20 MG tablet One after supper   gabapentin (NEURONTIN) 300 MG capsule Take 1 capsule (300 mg total) by mouth 3 (three) times daily.   lubiprostone (AMITIZA) 24 MCG capsule Take 1 capsule (24 mcg total) by mouth 2 (two) times daily with a meal.   Multiple Vitamin (MULTIVITAMIN) tablet Take 1 tablet by mouth daily.   pantoprazole (PROTONIX) 40 MG tablet Take 1 tablet (40 mg total) by mouth daily. Take 30-60 min before first meal of the day   senna (SENOKOT) 8.6 MG tablet Take 5 tablets (43 mg total) by mouth daily. (Patient taking differently: Take 5 tablets by mouth 2 (two) times daily.)   Vitamin D, Ergocalciferol, (DRISDOL) 1.25 MG (50000 UNIT) CAPS capsule Take 1 capsule (50,000 Units total) by mouth every 7 (seven) days.              Past Medical History:  Diagnosis Date   Abdominal discomfort  11/02/2012   suspected ?UTI- PCP MD placed on Cipro   Anxiety    Arthritis    Asthma    Cancer (HCC)    skin CA   Chronic constipation    Depression    Dysrhythmia    tachycardia   Environmental allergies    Fibromyalgia    GERD (gastroesophageal reflux disease)    Hemorrhoids    Hypercholesteremia    Hypertension    Sleep apnea       Objective:     Wt Readings from Last 3 Encounters:  07/29/22 172 lb (78 kg)  06/17/22  171 lb (77.6 kg)  06/11/22 170 lb (77.1 kg)      Vital signs reviewed  07/29/2022  - Note at rest 02 sats  94% on RA   General appearance:    somber amb wf freq dry throat clearing      HEENT : Oropharynx  clear/ no cobblestoing on pnd  Nasal turbinates nl    NECK :  without  apparent JVD/ palpable Nodes/TM    LUNGS: no acc muscle use,  Min barrel  contour chest wall with bilateral  slightly decreased bs s audible wheeze and  without cough on insp or exp maneuvers and min  Hyperresonant  to  percussion bilaterally    CV:  RRR  no s3 or murmur or increase in P2, and no edema   ABD:  soft and nontender with pos end  insp Hoover's  in the supine position.  No bruits or organomegaly appreciated   MS:  Nl gait/ ext warm without deformities Or obvious joint restrictions  calf tenderness, cyanosis or clubbing     SKIN: warm and dry without lesions    NEURO:  alert, approp, nl sensorium with  no motor or cerebellar deficits apparent.        CXR PA and Lateral:   06/17/2022 :    I personally reviewed images and agree with radiology impression as follows:    2.5 cm suspicious nodule within the right mid lung. Recommend further evaluation with chest CT.    Assessment

## 2022-07-29 ENCOUNTER — Ambulatory Visit (INDEPENDENT_AMBULATORY_CARE_PROVIDER_SITE_OTHER): Payer: HMO | Admitting: Internal Medicine

## 2022-07-29 ENCOUNTER — Encounter: Payer: Self-pay | Admitting: Internal Medicine

## 2022-07-29 VITALS — BP 104/56 | HR 76 | Ht 66.0 in | Wt 172.0 lb

## 2022-07-29 DIAGNOSIS — R911 Solitary pulmonary nodule: Secondary | ICD-10-CM

## 2022-07-29 DIAGNOSIS — J4520 Mild intermittent asthma, uncomplicated: Secondary | ICD-10-CM | POA: Diagnosis not present

## 2022-07-29 MED ORDER — METHYLPREDNISOLONE ACETATE 80 MG/ML IJ SUSP
120.0000 mg | Freq: Once | INTRAMUSCULAR | Status: AC
Start: 2022-07-29 — End: 2022-07-29
  Administered 2022-07-29: 120 mg via INTRAMUSCULAR

## 2022-07-29 NOTE — Patient Instructions (Signed)
Plan A = Automatic = Always=  Try symbicort 80 Take 2 puffs first thing in am and then another 2 puffs about 12 hours later and take gabapentin for breakfast, supper and bedtime      If not better after a month then ok to change to Trelegy 100 one click each am   Plan B for breathing = Backup (to supplement plan A, not to replace it) Only use your albuterol inhaler as a rescue medication to be used if you can't catch your breath by resting or doing a relaxed purse lip breathing pattern.  - The less you use it, the better it will work when you need it. - Ok to use the inhaler up to 2 puffs  every 4 hours if you must but call for appointment if use goes up over your usual need - Don't leave home without it !!  (think of it like the spare tire for your car)   Plan C = Crisis (instead of Plan B but only if Plan B stops working) - only use your albuterol nebulizer if you first try Plan B and it fails to help > ok to use the nebulizer up to every 4 hours but if start needing it regularly call for immediate appointment  For cough/ congestion >   mucinex dm  up to maximum of  1200 mg every 12 hours as needed  Continue Pantoprazole (protonix) 40 mg   Take  30-60 min before first meal of the day and Pepcid (famotidine)  20 mg after supper until return to office - this is the best way to tell whether stomach acid is contributing to your problem.     My office will be contacting you by phone for referral to PET scan   - if you don't hear back from my office within one week please call us back or notify us thru MyChart and we'll address it right away.   Depomedrol 120 mg IM

## 2022-07-30 ENCOUNTER — Telehealth: Payer: Self-pay | Admitting: Acute Care

## 2022-07-30 NOTE — Assessment & Plan Note (Signed)
Quit smoking 08/2020  x 2.5 cm R mid lung likely RUL on lateral view - CT chest s contrast 07/30/2022 >>> RUL ant segment slt spiculated nodule > PET next then consider RUlbectom if stage I   Discussed in detail all the  indications, usual  risks and alternatives  relative to the benefits with patient who agrees to proceed with w/u as outlined.            Each maintenance medication was reviewed in detail including emphasizing most importantly the difference between maintenance and prns and under what circumstances the prns are to be triggered using an action plan format where appropriate.  Total time for H and P, chart review, counseling, reviewing hfa device(s) and generating customized AVS unique to this office visit / same day charting = 35 mi

## 2022-07-30 NOTE — Telephone Encounter (Signed)
  IMPRESSION: 1. Large spiculated solid nodule of the right upper lobe, highly concerning for primary lung malignancy. Further evaluation with PET-CT and/or or consultation with Pulmonology or Thoracic Surgery is recommended. 2. Small solid pulmonary nodule of the right middle lobe measuring 3 mm. Recommend attention on follow-up. 3. Age-indeterminate moderate compression deformity of T6. Correlate for point tenderness. 4. Pericardial calcifications, likely due to prior infection or trauma. 5. Moderate aortic valve calcifications, findings can be seen in the setting of aortic stenosis. Correlate with echocardiography. 6. Aortic Atherosclerosis (ICD10-I70.0) and Emphysema (ICD10-J43.9).  Results were discussed with pt at ov with Dr Sherene Sires on 07/29/2022.

## 2022-07-30 NOTE — Telephone Encounter (Signed)
Tiffany called with call report.  CB# [phone]

## 2022-07-30 NOTE — Assessment & Plan Note (Signed)
Quit smoking 2022 due to cough/ wheeze worse since then  - 06/17/2022  After extensive coaching inhaler device,  effectiveness =  75% with hfa so try change dpi to symb 80 2bid as main problem is cough / choking / globus sensation and rx max gerd x 6 week trial > 07/29/2022 lung clear 07/29/2022 and no more noct choking but still globus/ freq throat clearing so rec depomedrol 120/ rechallenge with symb 80 2bid   - The proper method of use, as well as anticipated side effects, of a metered-dose inhaler were discussed and demonstrated to the patient using teach back method.

## 2022-08-05 DIAGNOSIS — H401132 Primary open-angle glaucoma, bilateral, moderate stage: Secondary | ICD-10-CM | POA: Diagnosis not present

## 2022-08-05 DIAGNOSIS — H532 Diplopia: Secondary | ICD-10-CM | POA: Diagnosis not present

## 2022-08-06 ENCOUNTER — Other Ambulatory Visit: Payer: Self-pay

## 2022-08-06 ENCOUNTER — Emergency Department (HOSPITAL_COMMUNITY)
Admission: EM | Admit: 2022-08-06 | Discharge: 2022-08-06 | Disposition: A | Discharge status: Home / Self Care | Payer: HMO | Attending: Emergency Medicine | Admitting: Emergency Medicine

## 2022-08-06 ENCOUNTER — Encounter (HOSPITAL_COMMUNITY): Payer: Self-pay | Admitting: Emergency Medicine

## 2022-08-06 DIAGNOSIS — Z96643 Presence of artificial hip joint, bilateral: Secondary | ICD-10-CM | POA: Insufficient documentation

## 2022-08-06 DIAGNOSIS — Z87891 Personal history of nicotine dependence: Secondary | ICD-10-CM | POA: Insufficient documentation

## 2022-08-06 DIAGNOSIS — K5641 Fecal impaction: Secondary | ICD-10-CM | POA: Insufficient documentation

## 2022-08-06 DIAGNOSIS — I10 Essential (primary) hypertension: Secondary | ICD-10-CM | POA: Insufficient documentation

## 2022-08-06 DIAGNOSIS — Z85828 Personal history of other malignant neoplasm of skin: Secondary | ICD-10-CM | POA: Diagnosis not present

## 2022-08-06 DIAGNOSIS — J45909 Unspecified asthma, uncomplicated: Secondary | ICD-10-CM | POA: Diagnosis not present

## 2022-08-06 NOTE — ED Notes (Addendum)
After manual disimpaction by EDP, pt ambulates to restroom and passes large amount of additional stool. States she still feels stool in rectum. Encouraged to walk around unit to facilitate BM. After ambulating, passes small amount of additional stool. Pt still has some sensation of stool in rectum but states this sensation is far improved. Pain now 3/10 from 7/10 at presentation.

## 2022-08-06 NOTE — ED Triage Notes (Signed)
Pt presents for stool impaction, has not had a BM in 4 days. Tried suppository, manual attempts to disimpact,and fleets enema at home.H/o similar experiences. Reporting 7/10 rectal pain.

## 2022-08-06 NOTE — ED Provider Notes (Signed)
[facility] Emergency Department Provider Note MRN:  [id number]  Arrival date & time: 08/06/22     Chief Complaint   Fecal Impaction   History of Present Illness   Zoe [name] is a 87 y.o. year-old female with a history of chronic constipation presenting to the ED with chief complaint of impaction.  Patient feels like she has a baseball sized stool that she cannot pass.  Has been trying to push it out all night.  Did not really sleep.  Feels very tired and uncomfortable.  Long history of constipation.  Not having any vomiting or abdominal pain.  Review of Systems  A thorough review of systems was obtained and all systems are negative except as noted in the HPI and PMH.   Patient's Health History    Past Medical History:  Diagnosis Date   Abdominal discomfort 11/02/2012   suspected ?UTI- PCP MD placed on Cipro   Anxiety    Arthritis    Asthma    Cancer (HCC)    skin CA   Chronic constipation    Depression    Dysrhythmia    tachycardia   Environmental allergies    Fibromyalgia    GERD (gastroesophageal reflux disease)    Hemorrhoids    Hypercholesteremia    Hypertension    Sleep apnea     Past Surgical History:  Procedure Laterality Date   ANAL RECTAL MANOMETRY N/A 11/28/2013   Procedure: ANO RECTAL MANOMETRY;  Surgeon: Romie Levee, MD;  Location: [facility];  Service: Endoscopy;  Laterality: N/A;   APPENDECTOMY     BACK SURGERY     X2    BIOPSY  03/13/2021   Procedure: BIOPSY;  Surgeon: Malissa Hippo, MD;  Location: [facility];  Service: Endoscopy;;   CHOLECYSTECTOMY     COLONOSCOPY  03/29/03   COLONOSCOPY N/A 12/09/2013   Procedure: COLONOSCOPY;  Surgeon: Malissa Hippo, MD;  [facility];  Service: Endoscopy;  Laterality: N/A;  730   COLONOSCOPY N/A 02/04/2017   Procedure: COLONOSCOPY;  Surgeon: Malissa Hippo, MD;  [facility];  Service: Endoscopy;  Laterality: N/A;  930   ESOPHAGEAL DILATION N/A  03/13/2021   Procedure: ESOPHAGEAL DILATION;  Surgeon: Malissa Hippo, MD;  [facility];  Service: Endoscopy;  Laterality: N/A;   ESOPHAGOGASTRODUODENOSCOPY (EGD) WITH PROPOFOL N/A 03/13/2021   Procedure: ESOPHAGOGASTRODUODENOSCOPY (EGD) WITH PROPOFOL;  Surgeon: Malissa Hippo, MD;  [facility];  Service: Endoscopy;  Laterality: N/A;  8:05   EYE SURGERY     bilateral cataract removal   IR KYPHO LUMBAR INC FX REDUCE BONE BX UNI/BIL CANNULATION INC/IMAGING  11/24/2019   IR KYPHO THORACIC WITH BONE BIOPSY  08/23/2019   IR KYPHO THORACIC WITH BONE BIOPSY  11/24/2019   IR RADIOLOGIST EVAL & MGMT  08/03/2019   IR RADIOLOGIST EVAL & MGMT  09/29/2019   IR RADIOLOGIST EVAL & MGMT  10/18/2019   IR RADIOLOGIST EVAL & MGMT  04/11/2020   SKIN CANCER EXCISION     RIGHT NECK     TONSILLECTOMY     T+A   TOTAL HIP ARTHROPLASTY Right 09/14/2012   Procedure: RIGHT TOTAL HIP ARTHROPLASTY ANTERIOR APPROACH and RIGHT KNEE STEROID INJECTION;  Surgeon: Kathryne Hitch, MD;  Location: [facility] OR;  Service: Orthopedics;  Laterality: Right;   TOTAL HIP ARTHROPLASTY Left 11/05/2012   Procedure: LEFT TOTAL HIP ARTHROPLASTY ANTERIOR APPROACH;  Surgeon: Kathryne Hitch, MD;  Location: [facility];  Service: Orthopedics;  Laterality: Left;   UMBILICAL HERNIA REPAIR  04/26/03   JENKINS   UPPER GASTROINTESTINAL ENDOSCOPY  08/03/2009   NUR   UPPER GASTROINTESTINAL ENDOSCOPY  03/29/2003   EGD ED TCS    Family History  Problem Relation Age of Onset   Healthy Son    Healthy Son    Healthy Daughter    Colon cancer Neg Hx     Social History   Socioeconomic History   Marital status: Married    Spouse name: Not on file   Number of children: Not on file   Years of education: Not on file   Highest education level: Not on file  Occupational History   Not on file  Tobacco Use   Smoking status: Former    Packs/day: 0.50    Years: 50.00    Additional pack years: 0.00    Total pack years:  25.00    Types: Cigarettes    Quit date: 09/06/2020    Years since quitting: 1.9   Smokeless tobacco: Never  Vaping Use   Vaping Use: Never used  Substance and Sexual Activity   Alcohol use: Yes    Alcohol/week: 0.0 standard drinks of alcohol    Comment: rare   Drug use: No   Sexual activity: Not Currently    Birth control/protection: Post-menopausal  Other Topics Concern   Not on file  Social History Narrative   Not on file   Social Determinants of Health   Financial Resource Strain: Low Risk  (07/10/2020)   Overall Financial Resource Strain (CARDIA)    Difficulty of Paying Living Expenses: Not hard at all  Food Insecurity: No Food Insecurity (07/10/2020)   Hunger Vital Sign    Worried About Running Out of Food in the Last Year: Never true    Ran Out of Food in the Last Year: Never true  Transportation Needs: No Transportation Needs (07/10/2020)   PRAPARE - Administrator, Civil Service (Medical): No    Lack of Transportation (Non-Medical): No  Physical Activity: Inactive (07/10/2020)   Exercise Vital Sign    Days of Exercise per Week: 0 days    Minutes of Exercise per Session: 0 min  Stress: No Stress Concern Present (07/10/2020)   [employer] of Occupational Health - Occupational Stress Questionnaire    Feeling of Stress : Only a little  Social Connections: Moderately Integrated (07/10/2020)   Social Connection and Isolation Panel [NHANES]    Frequency of Communication with Friends and Family: More than three times a week    Frequency of Social Gatherings with Friends and Family: Three times a week    Attends Religious Services: 1 to 4 times per year    Active Member of Clubs or Organizations: No    Attends Banker Meetings: Never    Marital Status: Married  Catering manager Violence: Not At Risk (07/10/2020)   Humiliation, Afraid, Rape, and Kick questionnaire    Fear of Current or Ex-Partner: No    Emotionally Abused: No    Physically  Abused: No    Sexually Abused: No     Physical Exam   Vitals:   08/06/22 0553  BP: 134/74  Pulse: 70  Resp: 16  Temp: 97.6 F (36.4 C)  SpO2: 96%    CONSTITUTIONAL: Well-appearing, NAD NEURO/PSYCH:  Alert and oriented x 3, no focal deficits EYES:  eyes equal and reactive ENT/NECK:  no LAD, no JVD CARDIO: Regular rate, well-perfused, normal  S1 and S2 PULM:  CTAB no wheezing or rhonchi GI/GU:  non-distended, non-tender MSK/SPINE:  No gross deformities, no edema SKIN:  no rash, atraumatic   *Additional and/or pertinent findings included in MDM below  Diagnostic and Interventional Summary    EKG Interpretation Date/Time:    Ventricular Rate:    PR Interval:    QRS Duration:    QT Interval:    QTC Calculation:   R Axis:      Text Interpretation:         Labs Reviewed - No data to display  No orders to display    Medications - No data to display   Procedures  /  Critical Care Fecal disimpaction  Date/Time: 08/06/2022 7:13 AM  Performed by: Sabas Sous, MD Authorized by: Sabas Sous, MD  Consent: Verbal consent obtained. Consent given by: patient Patient understanding: patient states understanding of the procedure being performed Patient identity confirmed: verbally with patient Time out: Immediately prior to procedure a "time out" was called to verify the correct patient, procedure, equipment, support staff and site/side marked as required. Local anesthesia used: no  Anesthesia: Local anesthesia used: no  Sedation: Patient sedated: no  Patient tolerance: patient tolerated the procedure well with no immediate complications Comments: Large amount of firm stool removed.     ED Course and Medical Decision Making  Initial Impression and Ddx History consistent with fecal impaction.  Abdomen soft nontender, vital signs normal.  Past medical/surgical history that increases complexity of ED encounter: Chronic constipation  Interpretation of  Diagnostics Laboratory and/or imaging options to aid in the diagnosis/care of the patient were considered.  After careful history and physical examination, it was determined that there was no indication for diagnostics at this time.  Patient Reassessment and Ultimate Disposition/Management     Successful disimpaction, patient appropriate for discharge with return precautions.  Patient management required discussion with the following services or consulting groups:  None  Complexity of Problems Addressed Acute complicated illness or Injury  Additional Data Reviewed and Analyzed Further history obtained from: Further history from spouse/family member  Additional Factors Impacting ED Encounter Risk Minor Procedures  Elmer Sow. Pilar Plate, MD [facility] Emergency Medicine [facility] [email or web address]  Final Clinical Impressions(s) / ED Diagnoses     ICD-10-CM   1. Fecal impaction (HCC)  K56.41       ED Discharge Orders     None        Discharge Instructions Discussed with and Provided to Patient:    Discharge Instructions      You were evaluated in the Emergency Department and after careful evaluation, we did not find any emergent condition requiring admission or further testing in the hospital.  Your exam/testing today is overall reassuring.  We did a fecal disimpaction procedure here in the emergency department.  Continue use of suppositories, enemas at home.  Please return to the Emergency Department if you experience any worsening of your condition.   Thank you for allowing [place] to be a part of your care.      Sabas Sous, MD 08/06/22 [phone]

## 2022-08-06 NOTE — Discharge Instructions (Signed)
You were evaluated in the Emergency Department and after careful evaluation, we did not find any emergent condition requiring admission or further testing in the hospital.  Your exam/testing today is overall reassuring.  We did a fecal disimpaction procedure here in the emergency department.  Continue use of suppositories, enemas at home.  Please return to the Emergency Department if you experience any worsening of your condition.   Thank you for allowing [place] to be a part of your care.

## 2022-08-07 ENCOUNTER — Telehealth: Payer: Self-pay

## 2022-08-07 NOTE — Telephone Encounter (Signed)
Evenity VOB initiated via [email or web address]  New start  

## 2022-08-08 NOTE — Telephone Encounter (Signed)
    To receive your Summary of Benefits electronically, please visit [email or web address] for more details. Insurance Verification Completed on 08/08/2022 by [employer]. If you have any questions about this information, please contact [employer] [phone] Monday through Friday, 9:00 AM to 8:00 PM [place] time. This summary of benefits is not a guarantee of payment. This insurance verification cannot take the place of written policy information from the payer. Any suggested coding options are informational and should not be a substitute for an independent clinical decision. They are not intended to be directive or a guarantee of reimbursement. Please contact your payer with questions. Class II: This material may be shown to members of the Healthcare Community only in response to a specific, unsolicited request for the information contained in this piece. This material must not be used for product detailing.

## 2022-08-08 NOTE — Telephone Encounter (Signed)
Pt ready for scheduling on or after: 08/11/2022  Out-of-pocket cost due at time of visit: $443  Primary: [employer]- Medicare [place] co-insurance: 20% (approximately $418) Admin fee co-insurance: 20% (approximately $25)  Secondary:  [place] co-insurance:  Admin fee co-insurance:   Deductible: Does not apply.   Prior Auth: NO PA# Valid:   ** This summary of benefits is an estimation of the patient's out-of-pocket cost. Exact cost may vary based on individual plan coverage.

## 2022-08-12 NOTE — Telephone Encounter (Signed)
OV scheduled 08/18/22 to discuss other less costly osteoporosis treatment options.

## 2022-08-14 ENCOUNTER — Encounter (HOSPITAL_COMMUNITY)
Admission: RE | Admit: 2022-08-14 | Discharge: 2022-08-14 | Disposition: A | Discharge status: Home / Self Care | Payer: HMO | Source: Ambulatory Visit | Attending: Internal Medicine | Admitting: Internal Medicine

## 2022-08-14 DIAGNOSIS — R911 Solitary pulmonary nodule: Secondary | ICD-10-CM | POA: Diagnosis not present

## 2022-08-14 MED ORDER — FLUDEOXYGLUCOSE F - 18 (FDG) INJECTION
8.3000 | Freq: Once | INTRAVENOUS | Status: AC | PRN
  Administered 2022-08-14: 8.3 via INTRAVENOUS

## 2022-08-18 ENCOUNTER — Ambulatory Visit: Payer: HMO | Admitting: Family Medicine

## 2022-08-18 VITALS — BP 106/66 | Ht 67.0 in | Wt 165.0 lb

## 2022-08-18 DIAGNOSIS — M810 Age-related osteoporosis without current pathological fracture: Secondary | ICD-10-CM

## 2022-08-18 MED ORDER — TYMLOS 3120 MCG/1.56ML ~~LOC~~ SOPN: 80.0000 ug | PEN_INJECTOR | Freq: Every day | SUBCUTANEOUS | 12 refills | Status: DC

## 2022-08-18 MED ORDER — ABOUTTIME PEN NEEDLE 31G X 5 MM MISC: 1.0000 | Freq: Every day | 0 refills | Status: DC

## 2022-08-18 NOTE — Progress Notes (Unsigned)
Patient came in today to discuss osteoporosis options.  Evenity will be too expensive for her.  She's had multiple vertebral compression fractures and her radius T score is less than -3.  Needs anabolic medication - will look into Tymlos instead - discussed how she would take this, risks/benefits.  If this is cost-prohibitive we did discuss zoledronic acid, prolia also.

## 2022-08-18 NOTE — Patient Instructions (Signed)
Forteo or Tymlos are the building bone medications we discussed (the same medicine but different brand names) - this is the one you'd give a shot daily.  If this is too expensive we can look into prolia (does not build bone but is a shot in the office every 6 months) OR zoledronic acid (IV infusion once a year for 5 years).

## 2022-08-21 ENCOUNTER — Telehealth: Payer: Self-pay | Admitting: Internal Medicine

## 2022-08-21 NOTE — Telephone Encounter (Signed)
PT calling stating Dr. Sherene Sires told her to try the Symbicort for 3 more weeks and she states she did and it is not doing anything for her. Please, she begs you, put her back on Trelegy.  Pharm is now [facility] in [place] (THIS IS A PHAR CHANGE)   Also, she had a PET scan done a week ago. Please advise if results are in.   Her number is [phone]

## 2022-08-22 MED ORDER — TRELEGY ELLIPTA 100-62.5-25 MCG/ACT IN AEPB: 1.0000 | INHALATION_SPRAY | Freq: Every day | RESPIRATORY_TRACT | 11 refills | Status: DC

## 2022-08-22 NOTE — Telephone Encounter (Signed)
Per LOV AVS ok to change back to Trelegy 100 if not help on Symbicort  The PET was done but has not been read the radiologist yet   I have sent in rx for the trelegy to her new, preferred pharm   Called the pt and there was no answer- LMTCB.

## 2022-08-22 NOTE — Telephone Encounter (Signed)
Patient is returning phone call. Patient phone number is [phone].

## 2022-08-25 ENCOUNTER — Telehealth: Payer: Self-pay | Admitting: Internal Medicine

## 2022-08-25 DIAGNOSIS — R911 Solitary pulmonary nodule: Secondary | ICD-10-CM | POA: Insufficient documentation

## 2022-08-25 NOTE — Progress Notes (Signed)
I called and spoke with the pt and scheduled appt with Dr. Tonia Brooms for 09/17/22 at 2:30 pm

## 2022-08-25 NOTE — Telephone Encounter (Signed)
Pt. Husband calling back and is stating that we don't need to have a new pt. Apt with Icard that Dr. Sherene Sires is wanting to go ahead and get the Bronch. To be Done please advise

## 2022-08-25 NOTE — Telephone Encounter (Signed)
Patient called back stating someone just called her about the PET scan, she thought it was [place]. Please call back at [phone].

## 2022-08-25 NOTE — Telephone Encounter (Signed)
Please advise on plan, thank you!

## 2022-08-25 NOTE — Telephone Encounter (Signed)
That's exactly what I recommended also  to her and she agreed/  Dr Tonia Brooms is also aware of the plan and agrees to do procedure

## 2022-08-26 ENCOUNTER — Other Ambulatory Visit: Payer: Self-pay | Admitting: *Deleted

## 2022-08-26 NOTE — Telephone Encounter (Signed)
Patient's husband called back and was advised of Dr. Thurston Hole message. Can we work on scheduling bronch procedure. please call patient back at [phone] with procedure information.

## 2022-08-26 NOTE — Telephone Encounter (Signed)
We will need an order for the bronch put in by Dr Tonia Brooms

## 2022-08-28 NOTE — Telephone Encounter (Signed)
Christen Butter, CMA 08/25/2022 11:55 AM EDT     I called and spoke with the pt and scheduled appt with Dr. Tonia Brooms for 09/17/22 at 2:30 pm

## 2022-08-29 ENCOUNTER — Encounter: Payer: Self-pay | Admitting: Pulmonary Disease

## 2022-08-29 ENCOUNTER — Other Ambulatory Visit: Payer: Self-pay

## 2022-08-29 ENCOUNTER — Encounter: Payer: Self-pay | Admitting: Sports Medicine

## 2022-08-29 ENCOUNTER — Ambulatory Visit (INDEPENDENT_AMBULATORY_CARE_PROVIDER_SITE_OTHER): Payer: HMO | Admitting: Sports Medicine

## 2022-08-29 DIAGNOSIS — M19012 Primary osteoarthritis, left shoulder: Secondary | ICD-10-CM

## 2022-08-29 DIAGNOSIS — M25511 Pain in right shoulder: Secondary | ICD-10-CM

## 2022-08-29 DIAGNOSIS — M25512 Pain in left shoulder: Secondary | ICD-10-CM | POA: Diagnosis not present

## 2022-08-29 DIAGNOSIS — G8929 Other chronic pain: Secondary | ICD-10-CM

## 2022-08-29 DIAGNOSIS — M19011 Primary osteoarthritis, right shoulder: Secondary | ICD-10-CM | POA: Diagnosis not present

## 2022-08-29 MED ORDER — LIDOCAINE HCL 1 % IJ SOLN
2.0000 mL | INTRAMUSCULAR | Status: AC | PRN
Start: 2022-08-29 — End: 2022-08-29
  Administered 2022-08-29: 2 mL

## 2022-08-29 MED ORDER — BUPIVACAINE HCL 0.25 % IJ SOLN
2.0000 mL | INTRAMUSCULAR | Status: AC | PRN
Start: 2022-08-29 — End: 2022-08-29
  Administered 2022-08-29: 2 mL via INTRA_ARTICULAR

## 2022-08-29 MED ORDER — METHYLPREDNISOLONE ACETATE 40 MG/ML IJ SUSP
40.0000 mg | INTRAMUSCULAR | Status: AC | PRN
Start: 2022-08-29 — End: 2022-08-29
  Administered 2022-08-29: 40 mg via INTRA_ARTICULAR

## 2022-08-29 NOTE — Progress Notes (Signed)
Zoe [name] - 87 y.o. female MRN [id number]  Date of birth: 1935-09-18  Office Visit Note: Visit Date: 08/29/2022 PCP: Corbin Ade, MD Referred by: Corbin Ade, MD  Subjective: Chief Complaint  Patient presents with   Right Shoulder - Follow-up   Left Shoulder - Follow-up   HPI: Zoe [name] is a pleasant 87 y.o. female who presents today for bilateral shoulder pain with known OA.  We did perform US-guided bilateral shoulder (GHJ) injections on 03/11/22, did receive good benefit for several months, clear however over the last month or so her pain has become exacerbated.  It is going somewhat down the arm as well.  She denies any numbness tingling or radicular symptoms however.  Having pain with laying on that side as well.  She does use gabapentin in the morning and at nighttime with some relief but here recently it is not helping her pain.  Also takes Tylenol.  She did have a spot found on her lung, she does have a bronchoscopy upcoming on 09/02/2022 with Dr. Tonia Brooms.  Pertinent ROS were reviewed with the patient and found to be negative unless otherwise specified above in HPI.   Assessment & Plan: Visit Diagnoses:  1. Primary osteoarthritis of both shoulders   2. Chronic right shoulder pain   3. Chronic left shoulder pain    Plan: Discussed with Zoe [name] she is dealing with an exacerbation of her bilateral shoulder osteoarthritis.  She did well previously with injections back in February.  She has been managing this with Tylenol and gabapentin, but her pain has worsened here over the last month or more.  Through shared decision-making, did proceed with ultrasound-guided glenohumeral joint injection into both shoulders, patient tolerated well.  She may ice as well as take Tylenol for any postinjection pain.  Discussed 48 hours of modified activity and then she may return without restriction.  Additional considerations would be getting her into a home exercise rehab program for  the shoulders.  She will continue her gabapentin 300 mg twice daily.  Some of her symptoms are going down the arms, if for some reason these injections do not bring her significant relief like last time, we could always consider subacromial joint injection or treatment dedicated to this/rotator cuff.   Follow-up: Return in about 3 months (around 11/29/2022), or if symptoms worsen or fail to improve, for bilateral shoulders (30-min f/u).   Meds & Orders: No orders of the defined types were placed in this encounter.   Orders Placed This Encounter  Procedures   Large Joint Inj   Large Joint Inj   US Guided Needle Placement - No Linked Charges     Procedures: Large Joint Inj: R glenohumeral on 08/29/2022 1:32 PM Indications: pain Details: 22 G 3.5 in needle, ultrasound-guided posterior approach Medications: 2 mL lidocaine 1 %; 2 mL bupivacaine 0.25 %; 40 mg methylPREDNISolone acetate 40 MG/ML Outcome: tolerated well, no immediate complications  US-guided glenohumeral joint injection, right shoulder After discussion on risks/benefits/indications, informed verbal consent was obtained. A timeout was then performed. The patient was positioned lying lateral recumbent on examination table. The patient's shoulder was prepped with betadine and multiple alcohol swabs and utilizing ultrasound guidance, the patient's glenohumeral joint was identified on ultrasound. Using ultrasound guidance a 22-gauge, 3.5 inch needle with a mixture of 2:2:1 cc's lidocaine:bupivicaine:depomedrol was directed from a lateral to medial direction via in-plane technique into the glenohumeral joint with visualization of appropriate spread of injectate into the joint. Patient  tolerated the procedure well without immediate complications.      Procedure, treatment alternatives, risks and benefits explained, specific risks discussed. Consent was given by the patient. Immediately prior to procedure a time out was called to verify the  correct patient, procedure, equipment, support staff and site/side marked as required. Patient was prepped and draped in the usual sterile fashion.    Large Joint Inj: L glenohumeral on 08/29/2022 1:32 PM Indications: pain Details: 22 G 3.5 in needle, ultrasound-guided posterior approach Medications: 2 mL lidocaine 1 %; 2 mL bupivacaine 0.25 %; 40 mg methylPREDNISolone acetate 40 MG/ML Outcome: tolerated well, no immediate complications  US-guided glenohumeral joint injection, left shoulder After discussion on risks/benefits/indications, informed verbal consent was obtained. A timeout was then performed. The patient was positioned lying lateral recumbent on examination table. The patient's shoulder was prepped with betadine and multiple alcohol swabs and utilizing ultrasound guidance, the patient's glenohumeral joint was identified on ultrasound. Using ultrasound guidance a 22-gauge, 3.5 inch needle with a mixture of 2:2:1 cc's lidocaine:bupivicaine:depomedrol was directed from a lateral to medial direction via in-plane technique into the glenohumeral joint with visualization of appropriate spread of injectate into the joint. Patient tolerated the procedure well without immediate complications.      Procedure, treatment alternatives, risks and benefits explained, specific risks discussed. Consent was given by the patient. Immediately prior to procedure a time out was called to verify the correct patient, procedure, equipment, support staff and site/side marked as required. Patient was prepped and draped in the usual sterile fashion.          Clinical History: No specialty comments available.  She reports that she quit smoking about 1 years ago. Her smoking use included cigarettes. She started smoking about 52 years ago. She has a 25 pack-year smoking history. She has never used smokeless tobacco. No results for input(s): "HGBA1C", "LABURIC" in the last 8760 hours.  Objective:   Vital Signs:  There were no vitals taken for this visit.  Physical Exam  Gen: Well-appearing, in no acute distress; non-toxic CV: Well-perfused. Warm.  Resp: Breathing unlabored on room air; no wheezing. Psych: Fluid speech in conversation; appropriate affect; normal thought process Neuro: Sensation intact throughout. No gross coordination deficits.   Ortho Exam - Bilateral shoulders: There is generalized tenderness to palpation throughout the posterior and lateral shoulder joint.  There is trace effusion in bilateral shoulders without warmth or significant redness.  There is pain at end range of motion in all planes.  There is some mild grating noted through range of motion.  There is ecchymosis of the left distal humerus and elbow from a fall, Band-Aid in place.  Imaging:  *Independent review and interpretation of left and right complete shoulder x-rays, 3 views from 03/05/2022 were reviewed.  This shows at least moderate glenohumeral arthritic changes of both shoulders.  No acute fracture noted.  Past Medical/Family/Surgical/Social History: Medications & Allergies reviewed per EMR, new medications updated. Patient Active Problem List   Diagnosis Date Noted   Lung nodule 08/25/2022   Solitary pulmonary nodule 06/22/2022   Aspiration into airway 11/28/2021   IBS (irritable bowel syndrome) 11/28/2021   SSBE (short-segment Barrett's esophagus) 06/25/2021   Lip pain 11/01/2020   Claudication (HCC) 08/02/2020   Hearing loss, right 07/16/2020   Seasonal allergies 07/04/2020   Insomnia 07/04/2020   Osteoporosis 07/04/2020   Lumbosacral spondylosis without myelopathy 06/01/2020   Polyarthritis 04/03/2020   Nausea 04/03/2020   Leg swelling 01/31/2020   Chronic diastolic CHF (congestive  heart failure) (HCC) 01/31/2020   Abdominal pain 01/10/2020   Stenosis of lateral recess of lumbar spine 12/06/2018   Foraminal stenosis of lumbar region 12/06/2018   Spondylosis without myelopathy or radiculopathy,  lumbar region 12/06/2018   Other idiopathic scoliosis, thoracolumbar region 12/06/2018   Chronic left shoulder pain 12/06/2018   History of colonic polyps 10/22/2016   SBO (small bowel obstruction) (HCC) 03/02/2013   Hyperlipidemia 12/08/2012   Gout 12/08/2012   Dysrhythmia 12/08/2012   Depression 12/08/2012   Asthmatic bronchitis 12/08/2012   Fibromyalgia 12/08/2012   Constipation 12/08/2012   Acute posthemorrhagic anemia 12/07/2012   Benign essential hypertension 12/07/2012   Degenerative arthritis of hip, left 11/05/2012   Weakness of right leg 03/24/2011   OA (osteoarthritis) 03/24/2011   Stiffness of cervical spine 03/24/2011   GERD (gastroesophageal reflux disease) 11/19/2010   Spinal stenosis 04/18/2009   HIP PAIN 11/17/2007   DDD (degenerative disc disease), lumbosacral 11/17/2007   ANSERINE BURSITIS, RIGHT 08/11/2007   Sciatica 06/22/2007   Knee pain 03/17/2007   Past Medical History:  Diagnosis Date   Abdominal discomfort 11/02/2012   suspected ?UTI- PCP MD placed on Cipro   Anxiety    Arthritis    Asthma    Cancer (HCC)    skin CA   Chronic constipation    Depression    Dysrhythmia    tachycardia   Environmental allergies    Fibromyalgia    GERD (gastroesophageal reflux disease)    Hemorrhoids    Hypercholesteremia    Hypertension    Sleep apnea    Family History  Problem Relation Age of Onset   Healthy Son    Healthy Son    Healthy Daughter    Colon cancer Neg Hx    Past Surgical History:  Procedure Laterality Date   ANAL RECTAL MANOMETRY N/A 11/28/2013   Procedure: ANO RECTAL MANOMETRY;  Surgeon: Romie Levee, MD;  Location: [facility];  Service: Endoscopy;  Laterality: N/A;   APPENDECTOMY     BACK SURGERY     X2    BIOPSY  03/13/2021   Procedure: BIOPSY;  Surgeon: Malissa Hippo, MD;  Location: [facility];  Service: Endoscopy;;   CHOLECYSTECTOMY     COLONOSCOPY  03/29/03   COLONOSCOPY N/A 12/09/2013   Procedure: COLONOSCOPY;   Surgeon: Malissa Hippo, MD;  Location: [facility];  Service: Endoscopy;  Laterality: N/A;  730   COLONOSCOPY N/A 02/04/2017   Procedure: COLONOSCOPY;  Surgeon: Malissa Hippo, MD;  Location: [facility];  Service: Endoscopy;  Laterality: N/A;  930   ESOPHAGEAL DILATION N/A 03/13/2021   Procedure: ESOPHAGEAL DILATION;  Surgeon: Malissa Hippo, MD;  [facility]: [facility];  Service: Endoscopy;  Laterality: N/A;   ESOPHAGOGASTRODUODENOSCOPY (EGD) WITH PROPOFOL N/A 03/13/2021   Procedure: ESOPHAGOGASTRODUODENOSCOPY (EGD) WITH PROPOFOL;  Surgeon: Malissa Hippo, MD;  Location: [facility];  Service: Endoscopy;  Laterality: N/A;  8:05   EYE SURGERY     bilateral cataract removal   IR KYPHO LUMBAR INC FX REDUCE BONE BX UNI/BIL CANNULATION INC/IMAGING  11/24/2019   IR KYPHO THORACIC WITH BONE BIOPSY  08/23/2019   IR KYPHO THORACIC WITH BONE BIOPSY  11/24/2019   IR RADIOLOGIST EVAL & MGMT  08/03/2019   IR RADIOLOGIST EVAL & MGMT  09/29/2019   IR RADIOLOGIST EVAL & MGMT  10/18/2019   IR RADIOLOGIST EVAL & MGMT  04/11/2020   SKIN CANCER EXCISION     RIGHT NECK  TONSILLECTOMY     T+A   TOTAL HIP ARTHROPLASTY Right 09/14/2012   Procedure: RIGHT TOTAL HIP ARTHROPLASTY ANTERIOR APPROACH and RIGHT KNEE STEROID INJECTION;  Surgeon: Kathryne Hitch, MD;  Location: [facility];  Service: Orthopedics;  Laterality: Right;   TOTAL HIP ARTHROPLASTY Left 11/05/2012   Procedure: LEFT TOTAL HIP ARTHROPLASTY ANTERIOR APPROACH;  Surgeon: Kathryne Hitch, MD;  Location: [facility];  Service: Orthopedics;  Laterality: Left;   UMBILICAL HERNIA REPAIR  04/26/03   JENKINS   UPPER GASTROINTESTINAL ENDOSCOPY  08/03/2009   NUR   UPPER GASTROINTESTINAL ENDOSCOPY  03/29/2003   EGD ED TCS   Social History   Occupational History   Not on file  Tobacco Use   Smoking status: Former    Current packs/day: 0.00    Average packs/day: 0.5 packs/day for 50.0 years (25.0 ttl pk-yrs)    Types: Cigarettes    Start  date: 09/07/1970    Quit date: 09/06/2020    Years since quitting: 1.9   Smokeless tobacco: Never  Vaping Use   Vaping status: Never Used  Substance and Sexual Activity   Alcohol use: Yes    Alcohol/week: 0.0 standard drinks of alcohol    Comment: rare   Drug use: No   Sexual activity: Not Currently    Birth control/protection: Post-menopausal

## 2022-08-29 NOTE — Addendum Note (Signed)
Addended by: Josephine Igo on: 08/29/2022 11:11 AM   Modules accepted: Orders

## 2022-08-29 NOTE — Telephone Encounter (Signed)
PCCM: Orders placed for bronchoscopy on Tuesday   Johny Drilling, please call the patient.   If they can't do this day then we can move someone else up in to the slot   Josephine Igo, DO [facility] 08/29/2022 11:06 AM

## 2022-09-01 ENCOUNTER — Other Ambulatory Visit: Payer: Self-pay

## 2022-09-01 ENCOUNTER — Encounter (HOSPITAL_COMMUNITY): Payer: Self-pay | Admitting: Pulmonary Disease

## 2022-09-01 NOTE — Pre-Procedure Instructions (Signed)
PCP - Ignatius Specking, MD  Chest x-ray - 09/17/22 EKG - DOS ECHO - 10/05/18  COVID TEST- N  Anesthesia review: N  Patient verbally denies any shortness of breath, fever, cough and chest pain during phone call   -------------  SDW INSTRUCTIONS given:  Your procedure is scheduled on Tuesday, August, 6th.  Report to [facility] Main Entrance "A" at 0530 A.M., and check in at the Admitting office.  Call this number if you have problems the morning of surgery:  [phone]   Remember:  Do not eat or drink after midnight the night before your surgery    Take these medicines the morning of surgery with A SIP OF WATER  allopurinol (ZYLOPRIM)  digoxin (LANOXIN)  TRUSOPT TRELEGY  NEURONTIN  AMITIZA  SENOKOT  Prilosec Albuterol-if needed (Please bring on the day of surgery) ALPRAZolam (XANAX)-if needed  As of today, STOP taking any Aspirin (unless otherwise instructed by your surgeon) Aleve, Naproxen, Ibuprofen, Motrin, Advil, Goody's, BC's, all herbal medications, fish oil, and all vitamins.                      Do not wear jewelry, make up, or nail polish            Do not wear lotions, powders, perfumes/colognes, or deodorant.            Do not shave 48 hours prior to surgery.  Men may shave face and neck.            Do not bring valuables to the hospital.            [facility] is not responsible for any belongings or valuables.  Do NOT Smoke (Tobacco/Vaping) 24 hours prior to your procedure If you use a CPAP at night, you may bring all equipment for your overnight stay.   Contacts, glasses, dentures or bridgework may not be worn into surgery.      For patients admitted to the hospital, discharge time will be determined by your treatment team.   Patients discharged the day of surgery will not be allowed to drive home, and someone needs to stay with them for 24 hours.    Special instructions:   [place]- Preparing For Surgery  Before surgery, you can play an  important role. Because skin is not sterile, your skin needs to be as free of germs as possible. You can reduce the number of germs on your skin by washing with CHG (chlorahexidine gluconate) Soap before surgery.  CHG is an antiseptic cleaner which kills germs and bonds with the skin to continue killing germs even after washing.    Oral Hygiene is also important to reduce your risk of infection.  Remember - BRUSH YOUR TEETH THE MORNING OF SURGERY WITH YOUR REGULAR TOOTHPASTE  Please do not use if you have an allergy to CHG or antibacterial soaps. If your skin becomes reddened/irritated stop using the CHG.  Do not shave (including legs and underarms) for at least 48 hours prior to first CHG shower. It is OK to shave your face.  Please follow these instructions carefully.   Shower the NIGHT BEFORE SURGERY and the MORNING OF SURGERY with DIAL Soap.   Pat yourself dry with a CLEAN TOWEL.  Wear CLEAN PAJAMAS to bed the night before surgery  Place CLEAN SHEETS on your bed the night of your first shower and DO NOT SLEEP WITH PETS.   Day of Surgery: Please shower morning of  surgery  Wear Clean/Comfortable clothing the morning of surgery Do not apply any deodorants/lotions.   Remember to brush your teeth WITH YOUR REGULAR TOOTHPASTE.   Questions were answered. Patient verbalized understanding of instructions.

## 2022-09-01 NOTE — Anesthesia Preprocedure Evaluation (Signed)
Anesthesia Evaluation  Patient identified by MRN, date of birth, ID band Patient awake    Reviewed: Allergy & Precautions, H&P , NPO status , Patient's Chart, lab work & pertinent test results  Airway Mallampati: III  TM Distance: >3 FB Neck ROM: Full    Dental no notable dental hx. (+) Teeth Intact, Dental Advisory Given   Pulmonary asthma , sleep apnea , Patient abstained from smoking., former smoker   Pulmonary exam normal breath sounds clear to auscultation       Cardiovascular Exercise Tolerance: Good hypertension, + dysrhythmias Atrial Fibrillation  Rhythm:Irregular Rate:Normal     Neuro/Psych   Anxiety Depression    negative neurological ROS     GI/Hepatic Neg liver ROS,GERD  Medicated,,  Endo/Other  negative endocrine ROS    Renal/GU negative Renal ROS  negative genitourinary   Musculoskeletal  (+) Arthritis , Osteoarthritis,  Fibromyalgia -  Abdominal   Peds  Hematology  (+) Blood dyscrasia, anemia   Anesthesia Other Findings   Reproductive/Obstetrics negative OB ROS                              Anesthesia Physical Anesthesia Plan  ASA: 3  Anesthesia Plan: General   Post-op Pain Management: Tylenol PO (pre-op)*   Induction: Intravenous  PONV Risk Score and Plan: 4 or greater and Ondansetron, Dexamethasone, Propofol infusion and TIVA  Airway Management Planned: Oral ETT  Additional Equipment:   Intra-op Plan:   Post-operative Plan: Extubation in OR  Informed Consent: I have reviewed the patients History and Physical, chart, labs and discussed the procedure including the risks, benefits and alternatives for the proposed anesthesia with the patient or authorized representative who has indicated his/her understanding and acceptance.     Dental advisory given  Plan Discussed with: CRNA  Anesthesia Plan Comments:         Anesthesia Quick Evaluation

## 2022-09-02 ENCOUNTER — Encounter (HOSPITAL_COMMUNITY)
Admission: RE | Disposition: A | Discharge status: Home / Self Care | Payer: Self-pay | Source: Home / Self Care | Attending: Pulmonary Disease

## 2022-09-02 ENCOUNTER — Ambulatory Visit (HOSPITAL_COMMUNITY)
Admission: RE | Admit: 2022-09-02 | Discharge: 2022-09-02 | Disposition: A | Discharge status: Home / Self Care | Payer: HMO | Attending: Pulmonary Disease | Admitting: Pulmonary Disease

## 2022-09-02 ENCOUNTER — Encounter (HOSPITAL_COMMUNITY): Payer: Self-pay | Admitting: Pulmonary Disease

## 2022-09-02 ENCOUNTER — Ambulatory Visit (HOSPITAL_COMMUNITY): Payer: Self-pay | Admitting: Anesthesiology

## 2022-09-02 ENCOUNTER — Ambulatory Visit (HOSPITAL_COMMUNITY): Payer: HMO

## 2022-09-02 ENCOUNTER — Other Ambulatory Visit: Payer: Self-pay

## 2022-09-02 ENCOUNTER — Ambulatory Visit (HOSPITAL_BASED_OUTPATIENT_CLINIC_OR_DEPARTMENT_OTHER): Payer: HMO | Admitting: Anesthesiology

## 2022-09-02 DIAGNOSIS — R911 Solitary pulmonary nodule: Secondary | ICD-10-CM | POA: Diagnosis not present

## 2022-09-02 DIAGNOSIS — I5032 Chronic diastolic (congestive) heart failure: Secondary | ICD-10-CM | POA: Diagnosis not present

## 2022-09-02 DIAGNOSIS — G4730 Sleep apnea, unspecified: Secondary | ICD-10-CM | POA: Insufficient documentation

## 2022-09-02 DIAGNOSIS — R918 Other nonspecific abnormal finding of lung field: Secondary | ICD-10-CM | POA: Diagnosis not present

## 2022-09-02 DIAGNOSIS — C3411 Malignant neoplasm of upper lobe, right bronchus or lung: Secondary | ICD-10-CM | POA: Diagnosis not present

## 2022-09-02 DIAGNOSIS — M797 Fibromyalgia: Secondary | ICD-10-CM | POA: Diagnosis not present

## 2022-09-02 DIAGNOSIS — C771 Secondary and unspecified malignant neoplasm of intrathoracic lymph nodes: Secondary | ICD-10-CM | POA: Insufficient documentation

## 2022-09-02 DIAGNOSIS — Z87891 Personal history of nicotine dependence: Secondary | ICD-10-CM | POA: Diagnosis not present

## 2022-09-02 DIAGNOSIS — J984 Other disorders of lung: Secondary | ICD-10-CM | POA: Diagnosis not present

## 2022-09-02 DIAGNOSIS — I110 Hypertensive heart disease with heart failure: Secondary | ICD-10-CM

## 2022-09-02 DIAGNOSIS — I10 Essential (primary) hypertension: Secondary | ICD-10-CM | POA: Diagnosis not present

## 2022-09-02 DIAGNOSIS — K219 Gastro-esophageal reflux disease without esophagitis: Secondary | ICD-10-CM | POA: Insufficient documentation

## 2022-09-02 DIAGNOSIS — I4891 Unspecified atrial fibrillation: Secondary | ICD-10-CM | POA: Diagnosis not present

## 2022-09-02 DIAGNOSIS — J45909 Unspecified asthma, uncomplicated: Secondary | ICD-10-CM | POA: Diagnosis not present

## 2022-09-02 DIAGNOSIS — I77810 Thoracic aortic ectasia: Secondary | ICD-10-CM | POA: Diagnosis not present

## 2022-09-02 DIAGNOSIS — E785 Hyperlipidemia, unspecified: Secondary | ICD-10-CM | POA: Diagnosis not present

## 2022-09-02 HISTORY — DX: Gastric contents in larynx causing asphyxiation, initial encounter: T17.310A

## 2022-09-02 HISTORY — PX: BRONCHIAL NEEDLE ASPIRATION BIOPSY: SHX5106

## 2022-09-02 HISTORY — PX: BRONCHIAL BRUSHINGS: SHX5108

## 2022-09-02 HISTORY — PX: BRONCHIAL BIOPSY: SHX5109

## 2022-09-02 HISTORY — PX: ENDOBRONCHIAL ULTRASOUND: SHX5096

## 2022-09-02 LAB — BASIC METABOLIC PANEL
Anion gap: 10 (ref 5–15)
BUN: 17 mg/dL (ref 8–23)
CO2: 27 mmol/L (ref 22–32)
Calcium: 8.8 mg/dL — ABNORMAL LOW (ref 8.9–10.3)
Chloride: 104 mmol/L (ref 98–111)
Creatinine, Ser: 0.96 mg/dL (ref 0.44–1.00)
GFR, Estimated: 58 mL/min — ABNORMAL LOW (ref >60)
Glucose, Bld: 105 mg/dL — ABNORMAL HIGH (ref 70–99)
Potassium: 4.3 mmol/L (ref 3.5–5.1)
Sodium: 141 mmol/L (ref 135–145)

## 2022-09-02 LAB — CBC
HCT: 36.5 % (ref 36.0–46.0)
Hemoglobin: 11.8 g/dL — ABNORMAL LOW (ref 12.0–15.0)
MCH: 32.1 pg (ref 26.0–34.0)
MCHC: 32.3 g/dL (ref 30.0–36.0)
MCV: 99.2 fL (ref 80.0–100.0)
Platelets: 186 10*3/uL (ref 150–400)
RBC: 3.68 MIL/uL — ABNORMAL LOW (ref 3.87–5.11)
RDW: 13.5 % (ref 11.5–15.5)
WBC: 6.5 10*3/uL (ref 4.0–10.5)
nRBC: 0 % (ref 0.0–0.2)

## 2022-09-02 SURGERY — BRONCHOSCOPY, WITH BIOPSY USING ELECTROMAGNETIC NAVIGATION
Anesthesia: General | Laterality: Bilateral

## 2022-09-02 MED ORDER — SUGAMMADEX SODIUM 200 MG/2ML IV SOLN
INTRAVENOUS | Status: DC | PRN
  Administered 2022-09-02: 150 mg via INTRAVENOUS

## 2022-09-02 MED ORDER — FENTANYL CITRATE (PF) 100 MCG/2ML IJ SOLN: 25.0000 ug | INTRAMUSCULAR | Status: DC | PRN

## 2022-09-02 MED ORDER — DEXAMETHASONE SODIUM PHOSPHATE 10 MG/ML IJ SOLN
INTRAMUSCULAR | Status: DC | PRN
  Administered 2022-09-02: 10 mg via INTRAVENOUS

## 2022-09-02 MED ORDER — PHENYLEPHRINE 80 MCG/ML (10ML) SYRINGE FOR IV PUSH (FOR BLOOD PRESSURE SUPPORT)
PREFILLED_SYRINGE | INTRAVENOUS | Status: DC | PRN
  Administered 2022-09-02: 80 ug via INTRAVENOUS
  Administered 2022-09-02: 160 ug via INTRAVENOUS

## 2022-09-02 MED ORDER — ACETAMINOPHEN 500 MG PO TABS
1000.0000 mg | ORAL_TABLET | Freq: Once | ORAL | Status: AC
  Administered 2022-09-02: 1000 mg via ORAL
  Filled 2022-09-02: qty 2

## 2022-09-02 MED ORDER — CHLORHEXIDINE GLUCONATE 0.12 % MT SOLN
OROMUCOSAL | Status: AC
  Administered 2022-09-02: 15 mL
  Filled 2022-09-02: qty 15

## 2022-09-02 MED ORDER — PHENYLEPHRINE HCL-NACL 20-0.9 MG/250ML-% IV SOLN
INTRAVENOUS | Status: DC | PRN
  Administered 2022-09-02: 30 ug/min via INTRAVENOUS

## 2022-09-02 MED ORDER — FENTANYL CITRATE (PF) 250 MCG/5ML IJ SOLN
INTRAMUSCULAR | Status: DC | PRN
  Administered 2022-09-02: 50 ug via INTRAVENOUS

## 2022-09-02 MED ORDER — PROPOFOL 500 MG/50ML IV EMUL
INTRAVENOUS | Status: DC | PRN
  Administered 2022-09-02: 125 ug/kg/min via INTRAVENOUS

## 2022-09-02 MED ORDER — FENTANYL CITRATE (PF) 100 MCG/2ML IJ SOLN
INTRAMUSCULAR | Status: AC
  Filled 2022-09-02: qty 2

## 2022-09-02 MED ORDER — ONDANSETRON HCL 4 MG/2ML IJ SOLN
INTRAMUSCULAR | Status: DC | PRN
  Administered 2022-09-02: 4 mg via INTRAVENOUS

## 2022-09-02 MED ORDER — LACTATED RINGERS IV SOLN: INTRAVENOUS | Status: DC

## 2022-09-02 MED ORDER — ROCURONIUM BROMIDE 10 MG/ML (PF) SYRINGE
PREFILLED_SYRINGE | INTRAVENOUS | Status: DC | PRN
  Administered 2022-09-02: 50 mg via INTRAVENOUS
  Administered 2022-09-02: 10 mg via INTRAVENOUS

## 2022-09-02 MED ORDER — LIDOCAINE 2% (20 MG/ML) 5 ML SYRINGE
INTRAMUSCULAR | Status: DC | PRN
  Administered 2022-09-02: 80 mg via INTRAVENOUS

## 2022-09-02 MED ORDER — PROPOFOL 10 MG/ML IV BOLUS
INTRAVENOUS | Status: DC | PRN
Start: 2022-09-02 — End: 2022-09-02
  Administered 2022-09-02: 80 mg via INTRAVENOUS

## 2022-09-02 NOTE — Anesthesia Postprocedure Evaluation (Signed)
Anesthesia Post Note  Patient: Lannie P Scallan  Procedure(s) Performed: ROBOTIC ASSISTED NAVIGATIONAL BRONCHOSCOPY (Bilateral) BRONCHIAL NEEDLE ASPIRATION BIOPSIES BRONCHIAL BRUSHINGS BRONCHIAL BIOPSIES ENDOBRONCHIAL ULTRASOUND (Bilateral)     Patient location during evaluation: PACU Anesthesia Type: General Level of consciousness: awake and alert Pain management: pain level controlled Vital Signs Assessment: post-procedure vital signs reviewed and stable Respiratory status: spontaneous breathing, nonlabored ventilation and respiratory function stable Cardiovascular status: blood pressure returned to baseline and stable Postop Assessment: no apparent nausea or vomiting Anesthetic complications: no  No notable events documented.  Last Vitals:  Vitals:   09/02/22 0930 09/02/22 0945  BP: 113/64 110/76  Pulse: 73 64  Resp: 15 19  Temp:  36.4 C  SpO2: 94% 92%    Last Pain:  Vitals:   09/02/22 0945  TempSrc:   PainSc: 0-No pain                 ,W. EDMOND

## 2022-09-02 NOTE — Anesthesia Procedure Notes (Signed)
Procedure Name: Intubation Date/Time: 09/02/2022 8:08 AM  Performed by: Kayleen Memos, CRNAPre-anesthesia Checklist: Patient identified, Emergency Drugs available, Suction available and Patient being monitored Patient Re-evaluated:Patient Re-evaluated prior to induction Oxygen Delivery Method: Circle System Utilized Preoxygenation: Pre-oxygenation with 100% oxygen Induction Type: IV induction Ventilation: Mask ventilation without difficulty Laryngoscope Size: Glidescope and 3 Grade View: Grade II Tube type: Oral Tube size: 8.5 mm Number of attempts: 1 Airway Equipment and Method: Stylet and Oral airway Placement Confirmation: ETT inserted through vocal cords under direct vision, positive ETCO2 and breath sounds checked- equal and bilateral Secured at: 24 cm Tube secured with: Tape Dental Injury: Teeth and Oropharynx as per pre-operative assessment

## 2022-09-02 NOTE — H&P (Signed)
Synopsis: Referred in Aug 2024 for lung nodule by Josephine Igo, DO  Subjective:   PATIENT ID: Zoe [name] GENDER: female DOB: August 20, 1935, MRN: [id number]  87 yo PMH depression, GERD, HTN. Tobacco use history. Found to have a lung nodule and adenopathy. Was referred for bronchoscopy.     Past Medical History:  Diagnosis Date   Abdominal discomfort 11/02/2012   suspected ?UTI- PCP MD placed on Cipro   Anxiety    Arthritis    Asthma    Cancer (HCC)    skin CA   Choking due to phlegm    Chronic constipation    Depression    Dysrhythmia    tachycardia   Environmental allergies    Fibromyalgia    GERD (gastroesophageal reflux disease)    Hemorrhoids    Hypercholesteremia    Hypertension    Sleep apnea      Family History  Problem Relation Age of Onset   Healthy Son    Healthy Son    Healthy Daughter    Colon cancer Neg Hx      Past Surgical History:  Procedure Laterality Date   ANAL RECTAL MANOMETRY N/A 11/28/2013   Procedure: ANO RECTAL MANOMETRY;  Surgeon: Romie Levee, MD;  Location: [facility];  Service: Endoscopy;  Laterality: N/A;   APPENDECTOMY     BACK SURGERY     X2    BIOPSY  03/13/2021   Procedure: BIOPSY;  Surgeon: Malissa Hippo, MD;  Location: [facility];  Service: Endoscopy;;   CHOLECYSTECTOMY     COLONOSCOPY  03/29/03   COLONOSCOPY N/A 12/09/2013   Procedure: COLONOSCOPY;  Surgeon: Malissa Hippo, MD;  Location: [facility];  Service: Endoscopy;  Laterality: N/A;  730   COLONOSCOPY N/A 02/04/2017   Procedure: COLONOSCOPY;  Surgeon: Malissa Hippo, MD;  [facility]: [facility];  Service: Endoscopy;  Laterality: N/A;  930   ESOPHAGEAL DILATION N/A 03/13/2021   Procedure: ESOPHAGEAL DILATION;  Surgeon: Malissa Hippo, MD;  [facility]: [facility];  Service: Endoscopy;  Laterality: N/A;   ESOPHAGOGASTRODUODENOSCOPY (EGD) WITH PROPOFOL N/A 03/13/2021   Procedure: ESOPHAGOGASTRODUODENOSCOPY (EGD) WITH PROPOFOL;  Surgeon: Malissa Hippo, MD;  Location: [facility];  Service: Endoscopy;  Laterality: N/A;  8:05   EYE SURGERY     bilateral cataract removal   IR KYPHO LUMBAR INC FX REDUCE BONE BX UNI/BIL CANNULATION INC/IMAGING  11/24/2019   IR KYPHO THORACIC WITH BONE BIOPSY  08/23/2019   IR KYPHO THORACIC WITH BONE BIOPSY  11/24/2019   IR RADIOLOGIST EVAL & MGMT  08/03/2019   IR RADIOLOGIST EVAL & MGMT  09/29/2019   IR RADIOLOGIST EVAL & MGMT  10/18/2019   IR RADIOLOGIST EVAL & MGMT  04/11/2020   SKIN CANCER EXCISION     RIGHT NECK     TONSILLECTOMY     T+A   TOTAL HIP ARTHROPLASTY Right 09/14/2012   Procedure: RIGHT TOTAL HIP ARTHROPLASTY ANTERIOR APPROACH and RIGHT KNEE STEROID INJECTION;  Surgeon: Kathryne Hitch, MD;  Location: [facility];  Service: Orthopedics;  Laterality: Right;   TOTAL HIP ARTHROPLASTY Left 11/05/2012   Procedure: LEFT TOTAL HIP ARTHROPLASTY ANTERIOR APPROACH;  Surgeon: Kathryne Hitch, MD;  Location: [facility];  Service: Orthopedics;  Laterality: Left;   UMBILICAL HERNIA REPAIR  04/26/03   JENKINS   UPPER GASTROINTESTINAL ENDOSCOPY  08/03/2009   NUR   UPPER GASTROINTESTINAL ENDOSCOPY  03/29/2003   EGD ED TCS    Social  History   Socioeconomic History   Marital status: Married    Spouse name: Not on file   Number of children: Not on file   Years of education: Not on file   Highest education level: Not on file  Occupational History   Not on file  Tobacco Use   Smoking status: Former    Current packs/day: 0.00    Average packs/day: 0.5 packs/day for 50.0 years (25.0 ttl pk-yrs)    Types: Cigarettes    Start date: 09/07/1970    Quit date: 09/06/2020    Years since quitting: 1.9   Smokeless tobacco: Never  Vaping Use   Vaping status: Never Used  Substance and Sexual Activity   Alcohol use: Yes    Alcohol/week: 0.0 standard drinks of alcohol    Comment: rare   Drug use: No   Sexual activity: Not Currently    Birth control/protection: Post-menopausal  Other Topics Concern    Not on file  Social History Narrative   Not on file   Social Determinants of Health   Financial Resource Strain: Low Risk  (07/10/2020)   Overall Financial Resource Strain (CARDIA)    Difficulty of Paying Living Expenses: Not hard at all  Food Insecurity: No Food Insecurity (07/10/2020)   Hunger Vital Sign    Worried About Running Out of Food in the Last Year: Never true    Ran Out of Food in the Last Year: Never true  Transportation Needs: No Transportation Needs (07/10/2020)   PRAPARE - Administrator, Civil Service (Medical): No    Lack of Transportation (Non-Medical): No  Physical Activity: Inactive (07/10/2020)   Exercise Vital Sign    Days of Exercise per Week: 0 days    Minutes of Exercise per Session: 0 min  Stress: No Stress Concern Present (07/10/2020)   [employer] of Occupational Health - Occupational Stress Questionnaire    Feeling of Stress : Only a little  Social Connections: Moderately Integrated (07/10/2020)   Social Connection and Isolation Panel [NHANES]    Frequency of Communication with Friends and Family: More than three times a week    Frequency of Social Gatherings with Friends and Family: Three times a week    Attends Religious Services: 1 to 4 times per year    Active Member of Clubs or Organizations: No    Attends Banker Meetings: Never    Marital Status: Married  Catering manager Violence: Not At Risk (07/10/2020)   Humiliation, Afraid, Rape, and Kick questionnaire    Fear of Current or Ex-Partner: No    Emotionally Abused: No    Physically Abused: No    Sexually Abused: No     Allergies  Allergen Reactions   Codeine Other (See Comments)    Pains in abdominal area   Escitalopram Itching   Other     NOVACAINE     ITCHING Anti-depressants   Oxycodone     Abdominal pain, chest pain   Paxil [Paroxetine Hcl] Itching   Procaine Itching     @ENCMEDSTART @  Review of Systems  Constitutional:  Negative for chills,  fever, malaise/fatigue and weight loss.  HENT:  Negative for hearing loss, sore throat and tinnitus.   Eyes:  Negative for blurred vision and double vision.  Respiratory:  Negative for cough, hemoptysis, sputum production, shortness of breath, wheezing and stridor.   Cardiovascular:  Negative for chest pain, palpitations, orthopnea, leg swelling and PND.  Gastrointestinal:  Negative for abdominal pain, constipation,  diarrhea, heartburn, nausea and vomiting.  Genitourinary:  Negative for dysuria, hematuria and urgency.  Musculoskeletal:  Negative for joint pain and myalgias.  Skin:  Negative for itching and rash.  Neurological:  Negative for dizziness, tingling, weakness and headaches.  Endo/Heme/Allergies:  Negative for environmental allergies. Does not bruise/bleed easily.  Psychiatric/Behavioral:  Negative for depression. The patient is not nervous/anxious and does not have insomnia.   All other systems reviewed and are negative.    Objective:  Physical Exam Vitals reviewed.  Constitutional:      General: She is not in acute distress.    Appearance: She is well-developed.  HENT:     Head: Normocephalic and atraumatic.  Eyes:     General: No scleral icterus.    Conjunctiva/sclera: Conjunctivae normal.     Pupils: Pupils are equal, round, and reactive to light.  Neck:     Vascular: No JVD.     Trachea: No tracheal deviation.  Cardiovascular:     Rate and Rhythm: Normal rate. Rhythm irregular.     Heart sounds: Normal heart sounds. No murmur heard. Pulmonary:     Effort: Pulmonary effort is normal. No tachypnea, accessory muscle usage or respiratory distress.     Breath sounds: No stridor. No wheezing, rhonchi or rales.  Abdominal:     General: There is no distension.     Palpations: Abdomen is soft.     Tenderness: There is no abdominal tenderness.  Musculoskeletal:        General: No tenderness.     Cervical back: Neck supple.  Lymphadenopathy:     Cervical: No cervical  adenopathy.  Skin:    General: Skin is warm and dry.     Capillary Refill: Capillary refill takes less than 2 seconds.     Findings: No rash.  Neurological:     Mental Status: She is alert and oriented to person, place, and time.  Psychiatric:        Behavior: Behavior normal.      Vitals:   09/01/22 1626 09/02/22 0628  BP:  112/67  Pulse:  75  Resp:  16  Temp:  97.7 F (36.5 C)  TempSrc:  Oral  SpO2:  98%  Weight: 74.4 kg 74.4 kg  Height: 5\' 7"  (1.702 m) 5\' 7"  (1.702 m)   98% on RA BMI Readings from Last 3 Encounters:  09/02/22 25.69 kg/m  08/18/22 25.84 kg/m  08/06/22 27.75 kg/m   Wt Readings from Last 3 Encounters:  09/02/22 74.4 kg  08/18/22 74.8 kg  08/06/22 78 kg     CBC    Component Value Date/Time   WBC 6.5 09/02/2022 0643   RBC 3.68 (L) 09/02/2022 0643   HGB 11.8 (L) 09/02/2022 0643   HGB 12.6 03/29/2020 1146   HCT 36.5 09/02/2022 0643   HCT 37.8 03/29/2020 1146   PLT 186 09/02/2022 0643   PLT 200 03/29/2020 1146   MCV 99.2 09/02/2022 0643   MCV 96 03/29/2020 1146   MCH 32.1 09/02/2022 0643   MCHC 32.3 09/02/2022 0643   RDW 13.5 09/02/2022 0643   RDW 12.7 03/29/2020 1146   LYMPHSABS 2.0 02/21/2021 1709   LYMPHSABS 1.7 03/29/2020 1146   MONOABS 0.6 02/21/2021 1709   EOSABS 0.3 02/21/2021 1709   EOSABS 0.5 (H) 03/29/2020 1146   BASOSABS 0.1 02/21/2021 1709   BASOSABS 0.0 03/29/2020 1146      Chest Imaging:  PET: Pet avid lesion in the right  The patient's images have been independently  reviewed by me.    Pulmonary Functions Testing Results:     No data to display          FeNO:   Pathology:   Echocardiogram:   Heart Catheterization:     Assessment & Plan:   Lung nodule Abnormal PET  AFIB, normal rate  Discussion:  Here today for robotic bronchoscopy. Patient with no complaints today  Answered all questions.  Plans to proceed    Current Facility-Administered Medications:    fentaNYL (SUBLIMAZE) 100 MCG/2ML  injection, , , ,    lactated ringers infusion, , Intravenous, Continuous, Gaynelle Adu, MD   Josephine Igo, DO [facility] 09/02/2022 7:27 AM

## 2022-09-02 NOTE — Discharge Instructions (Signed)

## 2022-09-02 NOTE — Transfer of Care (Signed)
Immediate Anesthesia Transfer of Care Note  Patient: Zoe [name]  Procedure(s) Performed: ROBOTIC ASSISTED NAVIGATIONAL BRONCHOSCOPY (Bilateral) BRONCHIAL NEEDLE ASPIRATION BIOPSIES BRONCHIAL BRUSHINGS BRONCHIAL BIOPSIES ENDOBRONCHIAL ULTRASOUND (Bilateral)  Patient Location: PACU  Anesthesia Type:General  Level of Consciousness: awake, oriented, drowsy, and patient cooperative  Airway & Oxygen Therapy: Patient Spontanous Breathing  Post-op Assessment: Report given to RN, Post -op Vital signs reviewed and stable, and Patient moving all extremities  Post vital signs: Reviewed and stable  Last Vitals:  Vitals Value Taken Time  BP 112/56 09/02/22 0913  Temp    Pulse 73 09/02/22 0915  Resp 23 09/02/22 0915  SpO2 95 % 09/02/22 0915  Vitals shown include unfiled device data.  Last Pain:  Vitals:   09/02/22 0628  TempSrc: Oral  PainSc:          Complications: No notable events documented.

## 2022-09-02 NOTE — Op Note (Signed)
Video Bronchoscopy with Robotic Assisted Bronchoscopic Navigation  Video Bronchoscopy with Endobronchial Ultrasound   Date of Operation: 09/02/2022   Pre-op Diagnosis: Lung nodule   Post-op Diagnosis: lung nodule   Surgeon: Josephine Igo, DO   Assistants: None   Anesthesia: General endotracheal anesthesia  Operation: Flexible video fiberoptic bronchoscopy with robotic assistance and biopsies.  Estimated Blood Loss: Minimal  Complications: None  Indications and History: Zoe [name] is a 87 y.o. female with history of RUL lung nodule. The risks, benefits, complications, treatment options and expected outcomes were discussed with the patient.  The possibilities of pneumothorax, pneumonia, reaction to medication, pulmonary aspiration, perforation of a viscus, bleeding, failure to diagnose a condition and creating a complication requiring transfusion or operation were discussed with the patient who freely signed the consent.    Description of Procedure: The patient was seen in the Preoperative Area, was examined and was deemed appropriate to proceed.  The patient was taken to [facility] endoscopy room.  3, identified as Zoe [name] and the procedure verified as Flexible Video Fiberoptic Bronchoscopy.  A Time Out was held and the above information confirmed.   Prior to the date of the procedure a high-resolution CT scan of the chest was performed. Utilizing ION software program a virtual tracheobronchial tree was generated to allow the creation of distinct navigation pathways to the patient's parenchymal abnormalities. After being taken to the operating room general anesthesia was initiated and the patient  was orally intubated. The video fiberoptic bronchoscope was introduced via the endotracheal tube and a general inspection was performed which showed normal right and left lung anatomy, aspiration of the bilateral mainstems was completed to remove any remaining secretions. Robotic catheter  inserted into patient's endotracheal tube.   Target #1 right upper lobe nodule: The distinct navigation pathways prepared prior to this procedure were then utilized to navigate to patient's lesion identified on CT scan. The robotic catheter was secured into place and the vision probe was withdrawn.  Lesion location was approximated using fluoroscopy and three-dimensional CT imaging for CT guided needle placement and for peripheral targeting. Under fluoroscopic guidance transbronchial needle brushings, transbronchial needle biopsies, and transbronchial forceps biopsies were performed to be sent for cytology and pathology.   Target #2 EBUS 10R: The video fiberoptic bronchoscope was introduced via the endotracheal tube and a general inspection was performed which showed normal right and left lung anatomy. The standard scope was then withdrawn and the endobronchial ultrasound was used to identify and characterize the peritracheal, hilar and bronchial lymph nodes. Inspection showed enlarged station 10R. Using real-time ultrasound guidance Wang needle biopsies were take from Station 10R nodes and were sent for cytology. The patient tolerated the procedure well without apparent complications. There was no significant blood loss. .   At the end of the procedure a general airway inspection was performed and there was no evidence of active bleeding. The bronchoscope was removed.  The patient tolerated the procedure well. There was no significant blood loss and there were no obvious complications. A post-procedural chest x-ray is pending.  Samples Target #1: 1. Transbronchial needle brushings from RUL nodule  2. Transbronchial Wang needle biopsies from RUL nodule  3. Transbronchial forceps biopsies from RUL nodule   Samples Target #2: 1. Wang needle biopsies from 10R node  Plans:  The patient will be discharged from the PACU to home when recovered from anesthesia and after chest x-ray is reviewed. We will  review the cytology, pathology results with the patient  when they become available. Outpatient followup will be with Josephine Igo, DO .  Josephine Igo, DO [facility] 09/02/2022 9:22 AM

## 2022-09-04 ENCOUNTER — Telehealth: Payer: Self-pay | Admitting: Pulmonary Disease

## 2022-09-04 NOTE — Telephone Encounter (Signed)
Pt returning missed call, pls advise

## 2022-09-05 DIAGNOSIS — G4700 Insomnia, unspecified: Secondary | ICD-10-CM | POA: Diagnosis not present

## 2022-09-05 DIAGNOSIS — I480 Paroxysmal atrial fibrillation: Secondary | ICD-10-CM | POA: Diagnosis not present

## 2022-09-05 DIAGNOSIS — J309 Allergic rhinitis, unspecified: Secondary | ICD-10-CM | POA: Diagnosis not present

## 2022-09-05 DIAGNOSIS — M549 Dorsalgia, unspecified: Secondary | ICD-10-CM | POA: Diagnosis not present

## 2022-09-05 DIAGNOSIS — Z299 Encounter for prophylactic measures, unspecified: Secondary | ICD-10-CM | POA: Diagnosis not present

## 2022-09-05 DIAGNOSIS — I10 Essential (primary) hypertension: Secondary | ICD-10-CM | POA: Diagnosis not present

## 2022-09-05 NOTE — Telephone Encounter (Addendum)
Pt calling back wanting to speak with someone about her biopsy report, pls contact Pt ASAP she is worried it may be cancerous. Pt doesn't want to wait until her appointment & it is okay for [place] to leave a detailed vm on her phone.

## 2022-09-05 NOTE — Telephone Encounter (Signed)
I spoke to pt and she stated she really wanted to know her biopsy results. But I informed her the report for the biopsy has not come back yet. But will send Dr Tonia Brooms a message if the report comes back before pt's appt with SG, NP on 09/11/22 to give her a call. Pt verbalized understanding. Message sent to Dr Tonia Brooms.

## 2022-09-07 ENCOUNTER — Encounter (HOSPITAL_COMMUNITY): Payer: Self-pay | Admitting: Pulmonary Disease

## 2022-09-10 ENCOUNTER — Encounter: Payer: Self-pay | Admitting: Acute Care

## 2022-09-10 ENCOUNTER — Ambulatory Visit: Payer: HMO | Admitting: Acute Care

## 2022-09-10 VITALS — BP 108/68 | HR 87 | Ht 66.0 in | Wt 169.6 lb

## 2022-09-10 DIAGNOSIS — C3411 Malignant neoplasm of upper lobe, right bronchus or lung: Secondary | ICD-10-CM | POA: Diagnosis not present

## 2022-09-10 DIAGNOSIS — C771 Secondary and unspecified malignant neoplasm of intrathoracic lymph nodes: Secondary | ICD-10-CM | POA: Diagnosis not present

## 2022-09-10 DIAGNOSIS — Z9889 Other specified postprocedural states: Secondary | ICD-10-CM | POA: Diagnosis not present

## 2022-09-10 DIAGNOSIS — Z87891 Personal history of nicotine dependence: Secondary | ICD-10-CM

## 2022-09-10 NOTE — Progress Notes (Signed)
Pathology reviewed at last office visit  Thanks,  BLI  Josephine Igo, DO [facility] 09/10/2022 4:36 PM

## 2022-09-10 NOTE — Progress Notes (Signed)
History of Present Illness Zoe [name] is a 87 y.o. female former smoker , smoker 1972-2022 with a 25 pack year smoking history referred to Dr. Tonia Brooms 07/2022 for pulmonary nodule. Additional PMH of depression, GERD, HTN.  Found to have a lung nodule and adenopathy. Was referred for bronchoscopy.   Synopsis Zoe [name] is a 87 y.o. female former smoker , smoker 1972-2022 with a 25 pack year smoking history referred to Dr. Tonia Brooms 07/2022 for pulmonary nodule. Additional PMH of depression, GERD, HTN.  Found to have a lung nodule and adenopathy. Was referred for bronchoscopy. She underwent Flexible video fiberoptic bronchoscopy with robotic assistance and biopsies on 09/02/2022.   09/10/2022 Pt. Presents for follow up with her granddaughter who is a Engineer, civil (consulting), and her husband,  after Flexible video fiberoptic bronchoscopy with robotic assistance and biopsies on 09/02/2022. She states he has done well since the procedure. No fever, hemoptysis, purulent secretions or breathing difficulties that are worse than her pre procedure baseline. She feels she has returned to her baseline.She was surprised at how easy the biopsy was to have done.  We have discussed her biopsy results. 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 We discussed that we will refer her to see medical oncology and also radiation oncology for treatment. She lives in [place], but prefers to have treatment in [place]. She would like to be referred to surgery also. She understands she may not be a candidate for surgery. I have ordered PFT's and placed the referral.  Surgery can review the PFT's once they are done and determine if she is a candidate, then schedule her if she is. We did have a discussion about doing nothing, but I encouraged her to go and see the treatment specialists, gather information, and then make an informed decision based on the information she  gathers. Both she and her family are in agreement with this plan.   Test Results: Cytology 09/02/2022 A. LUNG, RUL, FINE NEEDLE ASPIRATION AND BIOPSIES:  -Poorly differentiated squamous cell carcinoma   B. LUNG, RUL, BRUSH:  -Poorly differentiated squamous cell carcinoma    C. LYMPH NODE, 10R, FINE NEEDLE ASPIRATION:    FINAL MICROSCOPIC DIAGNOSIS:  - Squamous cell carcinoma   CT Chest 07/25/2022 Central airways are patent. Mild paraseptal and centrilobular emphysema. Large spiculated solid nodule of the right upper lobe measuring 2.3 x 2.0 cm on series 4, image 52. Small solid pulmonary nodule of the right middle lobe measuring 3 mm. Calcified granuloma of the left upper lobe, likely sequela prior granulomatous infection. No pleural effusion. Small calcified right pleural plaque, likely due to prior hemothorax or empyema. Large spiculated solid nodule of the right upper lobe, highly concerning for primary lung malignancy. Further evaluation with PET-CT and/or or consultation with Pulmonology or Thoracic Surgery is recommended. 2. Small solid pulmonary nodule of the right middle lobe measuring 3 mm. Recommend attention on follow-up. 3. Age-indeterminate moderate compression deformity of T6. Correlate for point tenderness. 4. Pericardial calcifications, likely due to prior infection or trauma. 5. Moderate aortic valve calcifications, findings can be seen in the setting of aortic stenosis. Correlate with echocardiography. 6. Aortic Atherosclerosis (ICD10-I70.0) and Emphysema (ICD10-J43.9).  08/14/2022 PET Scan The spiculated right upper lobe pulmonary nodule demonstrates intense hypermetabolic activity, consistent with bronchogenic carcinoma. 2. Single hypermetabolic right hilar lymph node consistent with nodal metastasis. 3. No evidence of distant metastatic disease. 4. Nonspecific mildly asymmetric  activity within the left palatine tonsil without corresponding suspicious  finding on the CT images, likely physiologic/reactive. 5. Multiple chronic compression deformities in the thoracolumbar spine with associated low-level activity. 6. Aortic Atherosclerosis (ICD10-I70.0) and Emphysema (ICD10-J43.9).     Latest Ref Rng & Units 09/02/2022    6:43 AM 02/21/2021    5:09 PM 03/29/2020   11:46 AM  CBC  WBC 4.0 - 10.5 K/uL 6.5  8.0  7.5   Hemoglobin 12.0 - 15.0 g/dL 16.1  09.6  04.5   Hematocrit 36.0 - 46.0 % 36.5  38.9  37.8   Platelets 150 - 400 K/uL 186  185  200        Latest Ref Rng & Units 09/02/2022    6:43 AM 02/21/2021    5:48 PM 02/21/2021    5:09 PM  BMP  Glucose 70 - 99 mg/dL 409   811   BUN 8 - 23 mg/dL 17   18   Creatinine 9.14 - 1.00 mg/dL 7.82  9.56  2.13   Sodium 135 - 145 mmol/L 141   136   Potassium 3.5 - 5.1 mmol/L 4.3   4.3   Chloride 98 - 111 mmol/L 104   104   CO2 22 - 32 mmol/L 27   26   Calcium 8.9 - 10.3 mg/dL 8.8   9.2     BNP No results found for: "BNP"  ProBNP No results found for: "PROBNP"  PFT No results found for: "FEV1PRE", "FEV1POST", "FVCPRE", "FVCPOST", "TLC", "DLCOUNC", "PREFEV1FVCRT", "PSTFEV1FVCRT"  DG CHEST PORT 1 VIEW  Result Date: 09/02/2022 CLINICAL DATA:  Status post bronchoscopic biopsy EXAM: PORTABLE CHEST 1 VIEW COMPARISON:  Chest radiographs done on May 21 24, CT done on 07/25/2022 FINDINGS: Transverse diameter of heart is within normal limits. Thoracic aorta is tortuous and ectatic. There are no signs of pulmonary edema. There is 2.9 x 2.5 cm nodule in right upper lobe. Small linear densities in the lower lung fields suggest subsegmental atelectasis. There is no pleural effusion or pneumothorax. IMPRESSION: There is 2.9 cm nodule in right upper lobe suggesting neoplastic process. Small linear densities in the lower lung fields may suggest crowding of bronchovascular structures or subsegmental atelectasis. There is no pleural effusion or pneumothorax. Electronically Signed   By: Ernie Avena M.D.   On:  09/02/2022 10:08   DG C-ARM BRONCHOSCOPY  Result Date: 09/02/2022 C-ARM BRONCHOSCOPY: Fluoroscopy was utilized by the requesting physician.  No radiographic interpretation.   NM PET Image Initial (PI) Skull Base To Thigh  Result Date: 08/24/2022 CLINICAL DATA:  Initial treatment strategy for right upper lobe pulmonary nodule on CT, highly concerning for primary lung cancer. EXAM: NUCLEAR MEDICINE PET SKULL BASE TO THIGH TECHNIQUE: 8.3 mCi F-18 FDG was injected intravenously. Full-ring PET imaging was performed from the skull base to thigh after the radiotracer. CT data was obtained and used for attenuation correction and anatomic localization. Fasting blood glucose: 120 mg/dl COMPARISON:  Chest CT 08/65/7846.  Abdominal CT 02/21/2021. FINDINGS: Mediastinal blood pool activity: SUV max 2.1 NECK: No hypermetabolic cervical lymph nodes are identified.Nonspecific mildly asymmetric activity within the left palatine tonsil (SUV max 3.8), without corresponding suspicious finding on the CT images, likely physiologic/reactive. No suspicious activity identified within the pharyngeal mucosal space. Incidental CT findings: Bilateral carotid atherosclerosis. CHEST: Single small hypermetabolic right hilar lymph node (SUV max 7.9). No other hypermetabolic mediastinal, hilar or axillary lymph nodes. The spiculated nodule inferiorly in the right upper lobe demonstrates intense hypermetabolic activity (SUV  max 11.8), consistent with bronchogenic carcinoma. This nodule is similar in size to the recent CT, measuring 2.3 x 2.3 cm on image 42/7. No other hypermetabolic pulmonary activity or suspicious nodularity. Incidental CT findings: Mild centrilobular and paraseptal emphysema. There is a calcified granuloma within the lingula. Atherosclerosis of the aorta, great vessels and coronary arteries. Thin pericardial calcifications are again noted, without significant pericardial or pleural effusion. ABDOMEN/PELVIS: There is no  hypermetabolic activity within the liver, adrenal glands, spleen or pancreas. There is no hypermetabolic nodal activity in the abdomen or pelvis. Bowel activity within physiologic limits. Incidental CT findings: Aortic and branch vessel atherosclerosis. SKELETON: There is no hypermetabolic activity to suggest osseous metastatic disease. Incidental CT findings: Low-level activity associated with previous multilevel spinal augmentation in the thoracolumbar spine. Multiple chronic compression deformities are grossly stable. Previous bilateral total hip arthroplasty. Chronic subcutaneous calcifications within the back and pelvis. IMPRESSION: 1. The spiculated right upper lobe pulmonary nodule demonstrates intense hypermetabolic activity, consistent with bronchogenic carcinoma. 2. Single hypermetabolic right hilar lymph node consistent with nodal metastasis. 3. No evidence of distant metastatic disease. 4. Nonspecific mildly asymmetric activity within the left palatine tonsil without corresponding suspicious finding on the CT images, likely physiologic/reactive. 5. Multiple chronic compression deformities in the thoracolumbar spine with associated low-level activity. 6. Aortic Atherosclerosis (ICD10-I70.0) and Emphysema (ICD10-J43.9). Electronically Signed   By: Carey Bullocks M.D.   On: 08/24/2022 11:35     Past medical hx Past Medical History:  Diagnosis Date   Abdominal discomfort 11/02/2012   suspected ?UTI- PCP MD placed on Cipro   Anxiety    Arthritis    Asthma    Cancer (HCC)    skin CA   Choking due to phlegm    Chronic constipation    Depression    Dysrhythmia    tachycardia   Environmental allergies    Fibromyalgia    GERD (gastroesophageal reflux disease)    Hemorrhoids    Hypercholesteremia    Hypertension    Sleep apnea      Social History   Tobacco Use   Smoking status: Former    Current packs/day: 0.00    Average packs/day: 0.5 packs/day for 50.0 years (25.0 ttl pk-yrs)     Types: Cigarettes    Start date: 09/07/1970    Quit date: 09/06/2020    Years since quitting: 2.0   Smokeless tobacco: Never  Vaping Use   Vaping status: Never Used  Substance Use Topics   Alcohol use: Yes    Alcohol/week: 0.0 standard drinks of alcohol    Comment: rare   Drug use: No    Ms.Hockett reports that she quit smoking about 2 years ago. Her smoking use included cigarettes. She started smoking about 52 years ago. She has a 25 pack-year smoking history. She has never used smokeless tobacco. She reports current alcohol use. She reports that she does not use drugs.  Tobacco Cessation: Former smoker , quit 2022 with a 25 pack year smoking history   Past surgical hx, Family hx, Social hx all reviewed.  Current Outpatient Medications on File Prior to Visit  Medication Sig   albuterol (PROVENTIL HFA;VENTOLIN HFA) 108 (90 BASE) MCG/ACT inhaler Inhale 2 puffs into the lungs every 6 (six) hours as needed for wheezing or shortness of breath.   allopurinol (ZYLOPRIM) 300 MG tablet Take 1 tablet (300 mg total) by mouth daily.   ALPRAZolam (XANAX) 0.5 MG tablet Take 1 tablet (0.5 mg total) by mouth 2 (two) times  daily. (Patient taking differently: Take 0.5 mg by mouth 2 (two) times daily as needed for anxiety.)   aspirin 81 MG tablet Take 1 tablet (81 mg total) by mouth daily.   Biotin 40981 MCG TABS Take 10,000 mcg by mouth daily.   calcium-vitamin D 250-100 MG-UNIT tablet Take 1 tablet by mouth 2 (two) times daily.   diclofenac Sodium (VOLTAREN) 1 % GEL Apply 2-4 g topically 4 (four) times daily. APPLY 2-4 GRAMS TOPICALLY FOUR TIMES DAILY Strength: 1 %   digoxin (LANOXIN) 0.125 MG tablet Take 125 mcg by mouth daily.   dorzolamide (TRUSOPT) 2 % ophthalmic solution 1 drop 2 (two) times daily.   doxepin (SINEQUAN) 100 MG capsule Take 1 capsule (100 mg total) by mouth at bedtime.   lubiprostone (AMITIZA) 24 MCG capsule Take 1 capsule (24 mcg total) by mouth 2 (two) times daily with a  meal.   omeprazole (PRILOSEC OTC) 20 MG tablet Take 20 mg by mouth daily.   senna (SENOKOT) 8.6 MG tablet Take 5 tablets (43 mg total) by mouth daily. (Patient taking differently: Take 5 tablets by mouth 2 (two) times daily.)   Vitamin D, Ergocalciferol, (DRISDOL) 1.25 MG (50000 UNIT) CAPS capsule Take 1 capsule (50,000 Units total) by mouth every 7 (seven) days.   Abaloparatide (TYMLOS) 3120 MCG/1.56ML SOPN Inject 80 mcg into the skin daily. (Patient not taking: Reported on 09/10/2022)   acetaminophen (TYLENOL) 500 MG tablet Take 500 mg by mouth. 3 -5 per day (Patient not taking: Reported on 09/10/2022)   budesonide-formoterol (SYMBICORT) 80-4.5 MCG/ACT inhaler Take 2 puffs first thing in am and then another 2 puffs about 12 hours later.   cefdinir (OMNICEF) 300 MG capsule Take 1 capsule (300 mg total) by mouth 2 (two) times daily.   famotidine (PEPCID) 20 MG tablet One after supper (Patient not taking: Reported on 09/10/2022)   Fluticasone-Umeclidin-Vilant (TRELEGY ELLIPTA) 100-62.5-25 MCG/ACT AEPB Inhale 1 puff into the lungs daily. (Patient not taking: Reported on 09/10/2022)   gabapentin (NEURONTIN) 300 MG capsule Take 1 capsule (300 mg total) by mouth 3 (three) times daily.   Insulin Pen Needle (ABOUTTIME PEN NEEDLE) 31G X 5 MM MISC 1 each by Does not apply route daily. (Patient not taking: Reported on 09/10/2022)   Multiple Vitamin (MULTIVITAMIN) tablet Take 1 tablet by mouth daily. (Patient not taking: Reported on 09/10/2022)   pantoprazole (PROTONIX) 40 MG tablet Take 1 tablet (40 mg total) by mouth daily. Take 30-60 min before first meal of the day (Patient not taking: Reported on 09/10/2022)   No current facility-administered medications on file prior to visit.     Allergies  Allergen Reactions   Codeine Other (See Comments)    Pains in abdominal area   Escitalopram Itching   Other     NOVACAINE     ITCHING Anti-depressants   Oxycodone     Abdominal pain, chest pain   Paxil  [Paroxetine Hcl] Itching   Procaine Itching    Review Of Systems:  Constitutional:   No  weight loss, night sweats,  Fevers, chills, fatigue, or  lassitude.  HEENT:   No headaches,  Difficulty swallowing,  Tooth/dental problems, or  Sore throat,                No sneezing, itching, ear ache, nasal congestion, post nasal drip,   CV:  No chest pain,  Orthopnea, PND, swelling in lower extremities, anasarca, dizziness, palpitations, syncope.   GI  No heartburn, indigestion, abdominal pain, nausea, vomiting,  diarrhea, change in bowel habits, loss of appetite, bloody stools.   Resp: No shortness of breath with exertion or at rest.  + excess mucus, + productive cough,  No non-productive cough,  No coughing up of blood.  No change in color of mucus.  No wheezing.  No chest wall deformity  Skin: no rash or lesions.  GU: no dysuria, change in color of urine, no urgency or frequency.  No flank pain, no hematuria   MS:  No joint pain or swelling.  + decreased range of motion.  No back pain.  Psych:  No change in mood or affect. No depression or anxiety.  No memory loss.   Vital Signs BP 108/68 (BP Location: Left Arm, Cuff Size: Normal)   Pulse 87   Ht 5\' 6"  (1.676 m)   Wt 169 lb 9.6 oz (76.9 kg)   SpO2 95%   BMI 27.37 kg/m    Physical Exam:  General- No distress,  A&Ox3, pleasant  ENT: No sinus tenderness, TM clear, pale nasal mucosa, no oral exudate,no post nasal drip, no LAN Cardiac: S1, S2, regular rate and rhythm, no murmur Chest: No wheeze/ rales/ dullness; no accessory muscle use, no nasal flaring, no sternal retractions, diminished per bases Abd.: Soft Non-tender, ND, BS +, Body mass index is 27.37 kg/m.  Ext: No clubbing cyanosis, edema Neuro:  normal strength, MAE x 4, A&O x 3, very appropriate Skin: No rashes, warm and dry, no lesions  Psych: normal mood and behavior   Assessment/Plan  New Diagnosis Squamous cell lung cancer of the RUL and #10 lymph node.  Former  smoker quit 2022 with a 25 pack year smoking history Plan Your biopsy is positive for squamous cell lung cancer in the right upper lobe and also in the #10 lymph node.  We will refer you to Medical oncology and Radiation oncology for treatment   They will take great care of you.  Please follow up with cardiology regarding atrial fibrillation, and GI regarding your difficulty swallowing. Let us know if you have any additional questions.  Please contact office for sooner follow up if symptoms do not improve or worsen or seek emergency care     I spent 35 minutes dedicated to the care of this patient on the date of this encounter to include pre-visit review of records, face-to-face time with the patient discussing conditions above, post visit ordering of testing, clinical documentation with the electronic health record, making appropriate referrals as documented, and communicating necessary information to the patient's healthcare team.    Bevelyn Ngo, NP 09/10/2022  2:15 PM

## 2022-09-10 NOTE — Patient Instructions (Addendum)
It is good to see you today. Your biopsy is positive for squamous cell lung cancer in the right upper lobe and also in the #10 lymph node.  We will refer you to Medical oncology and Radiation oncology for treatment . You will get calls to get these appointments scheduled.  I will also refer you to surgery but we will need to do PFT's first. I have ordered these.  You will get a call to schedule.  This is a great team.  They will take great care of you.   Please follow up with cardiology regarding atrial fibrillation. Let us know if you have any additional questions.  Please contact office for sooner follow up if symptoms do not improve or worsen or seek emergency care

## 2022-09-11 ENCOUNTER — Ambulatory Visit: Payer: HMO | Admitting: Acute Care

## 2022-09-12 DIAGNOSIS — Z299 Encounter for prophylactic measures, unspecified: Secondary | ICD-10-CM | POA: Diagnosis not present

## 2022-09-12 DIAGNOSIS — I10 Essential (primary) hypertension: Secondary | ICD-10-CM | POA: Diagnosis not present

## 2022-09-12 DIAGNOSIS — I480 Paroxysmal atrial fibrillation: Secondary | ICD-10-CM | POA: Diagnosis not present

## 2022-09-15 DIAGNOSIS — C3411 Malignant neoplasm of upper lobe, right bronchus or lung: Secondary | ICD-10-CM | POA: Insufficient documentation

## 2022-09-15 NOTE — Progress Notes (Signed)
Thoracic Location of Tumor / Histology: RUL Lung Cancer  Patient presented to pulmonary with complaints of cough.  Chest xray was obtained and showed a 2.5 cm nodule in the right mid lung.    Bronchoscopy 09/02/2022:  PET 08/14/2022: RUL measured 2.3 cm.  There was a small hypermetabolic right hilar node. No other adenopathy or evidence of metastatic findings were appreciated.  CT Chest 07/25/2022:  Large spiculated solid nodule of the right upper lobe measuring 2.3 x 2.0 cm.   Small solid pulmonary nodule of the right middle lobe measuring 3 mm.    Biopsies of RUL Lung 09/02/2022    Past/Anticipated interventions by cardiothoracic surgery, if any:   Past/Anticipated interventions by medical oncology, if any:  Dr. Arbutus Ped 09/24/2022   Tobacco/Marijuana/Snuff/ETOH use: Former smoker, quit 2022.   Signs/Symptoms Weight changes, if any: {:18581} Respiratory complaints, if any: {:18581} Hemoptysis, if any: {:18581} Pain issues, if any:  {:18581}  SAFETY ISSUES: Prior radiation? {:18581} Pacemaker/ICD? {:18581}  Possible current pregnancy?{:18581} Is the patient on methotrexate? {:18581}  Current Complaints / other details:  ***

## 2022-09-15 NOTE — Progress Notes (Signed)
Radiation Oncology         ([phone]) [phone] ________________________________  Name: Zoe [name]        MRN: [id number]  Date of Service: 09/16/2022 DOB: [birth year]  GE:XBMW, Angelina Pih, MD  Josephine Igo, DO     REFERRING PHYSICIAN: Josephine Igo, DO   DIAGNOSIS: The encounter diagnosis was Malignant neoplasm of upper lobe of right lung (HCC).   HISTORY OF PRESENT ILLNESS: Zoe [name] is a 87 y.o. female seen at the request of Dr. Tonia Brooms with a new diagnosis of lung cancer. The patient was being evaluated by Dr. Sherene Sires in pulmonology for a cough and a CXR showed a 2.5 cm nodule in the right mid lung. A CT chest wo contrast on 07/25/22 showed a large spiculated solid nodule of the RUL measuring 2.3 cm, and a small solid RML nodule measuring 3 mm. A calcified granuloma of the LUL was noted and no enlarged nodes were appreciated in the mediastinum. There was moderate compression deformity of T6 and compression deformities of T11-L1 with vertebroplasty changes. A PET on 08/14/22 showed the nodule in the RUL measured 2.3 cm, and had an SUV of 11.8. There was a small hypermetabolic right hilar node with an SUV of 7.9. no other adenopathy or evidence of metastatic findings were appreciated. 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 She is seen today to discuss treatment recommendations of her cancer, and is scheduled to See Dr. Arbutus Ped on 09/24/22.     PREVIOUS RADIATION THERAPY: {EXAM; YES/NO:19492::"No"}   PAST MEDICAL HISTORY:  Past Medical History:  Diagnosis Date   Abdominal discomfort 11/02/2012   suspected ?UTI- PCP MD placed on Cipro   Anxiety    Arthritis    Asthma    Cancer (HCC)    skin CA   Choking due to phlegm    Chronic constipation    Depression    Dysrhythmia    tachycardia   Environmental allergies    Fibromyalgia    GERD  (gastroesophageal reflux disease)    Hemorrhoids    Hypercholesteremia    Hypertension    Sleep apnea        PAST SURGICAL HISTORY: Past Surgical History:  Procedure Laterality Date   ANAL RECTAL MANOMETRY N/A 11/28/2013   Procedure: ANO RECTAL MANOMETRY;  Surgeon: Romie Levee, MD;  Location: [facility];  Service: Endoscopy;  Laterality: N/A;   APPENDECTOMY     BACK SURGERY     X2    BIOPSY  03/13/2021   Procedure: BIOPSY;  Surgeon: Malissa Hippo, MD;  Location: [facility];  Service: Endoscopy;;   BRONCHIAL BIOPSY  09/02/2022   Procedure: BRONCHIAL BIOPSIES;  Surgeon: Josephine Igo, DO;  Location: [facility];  Service: Pulmonary;;   BRONCHIAL BRUSHINGS  09/02/2022   Procedure: BRONCHIAL BRUSHINGS;  Surgeon: Josephine Igo, DO;  Location: [facility];  Service: Pulmonary;;   BRONCHIAL NEEDLE ASPIRATION BIOPSY  09/02/2022   Procedure: BRONCHIAL NEEDLE ASPIRATION BIOPSIES;  Surgeon: Josephine Igo, DO;  Location: [facility];  Service: Pulmonary;;   CHOLECYSTECTOMY     COLONOSCOPY  03/29/03   COLONOSCOPY N/A 12/09/2013   Procedure: COLONOSCOPY;  Surgeon: Malissa Hippo, MD;  [facility];  Service: Endoscopy;  Laterality: N/A;  730   COLONOSCOPY N/A 02/04/2017   Procedure: COLONOSCOPY;  Surgeon: Malissa Hippo, MD;  [facility]: [facility];  Service: Endoscopy;  Laterality: N/A;  930   ENDOBRONCHIAL ULTRASOUND Bilateral 09/02/2022   Procedure: ENDOBRONCHIAL ULTRASOUND;  Surgeon: Josephine Igo, DO;  Location: [facility];  Service: Pulmonary;  Laterality: Bilateral;   ESOPHAGEAL DILATION N/A 03/13/2021   Procedure: ESOPHAGEAL DILATION;  Surgeon: Malissa Hippo, MD;  [facility];  Service: Endoscopy;  Laterality: N/A;   ESOPHAGOGASTRODUODENOSCOPY (EGD) WITH PROPOFOL N/A 03/13/2021   Procedure: ESOPHAGOGASTRODUODENOSCOPY (EGD) WITH PROPOFOL;  Surgeon: Malissa Hippo, MD;  [facility];  Service: Endoscopy;  Laterality: N/A;  8:05   EYE  SURGERY     bilateral cataract removal   IR KYPHO LUMBAR INC FX REDUCE BONE BX UNI/BIL CANNULATION INC/IMAGING  11/24/2019   IR KYPHO THORACIC WITH BONE BIOPSY  08/23/2019   IR KYPHO THORACIC WITH BONE BIOPSY  11/24/2019   IR RADIOLOGIST EVAL & MGMT  08/03/2019   IR RADIOLOGIST EVAL & MGMT  09/29/2019   IR RADIOLOGIST EVAL & MGMT  10/18/2019   IR RADIOLOGIST EVAL & MGMT  04/11/2020   SKIN CANCER EXCISION     RIGHT NECK     TONSILLECTOMY     T+A   TOTAL HIP ARTHROPLASTY Right 09/14/2012   Procedure: RIGHT TOTAL HIP ARTHROPLASTY ANTERIOR APPROACH and RIGHT KNEE STEROID INJECTION;  Surgeon: Kathryne Hitch, MD;  Location: [facility];  Service: Orthopedics;  Laterality: Right;   TOTAL HIP ARTHROPLASTY Left 11/05/2012   Procedure: LEFT TOTAL HIP ARTHROPLASTY ANTERIOR APPROACH;  Surgeon: Kathryne Hitch, MD;  Location: [facility];  Service: Orthopedics;  Laterality: Left;   UMBILICAL HERNIA REPAIR  04/26/03   JENKINS   UPPER GASTROINTESTINAL ENDOSCOPY  08/03/2009   NUR   UPPER GASTROINTESTINAL ENDOSCOPY  03/29/2003   EGD ED TCS     FAMILY HISTORY:  Family History  Problem Relation Age of Onset   Healthy Son    Healthy Son    Healthy Daughter    Colon cancer Neg Hx      SOCIAL HISTORY:  reports that she quit smoking about 2 years ago. Her smoking use included cigarettes. She started smoking about 52 years ago. She has a 25 pack-year smoking history. She has never used smokeless tobacco. She reports current alcohol use. She reports that she does not use drugs. The patient is married and lives in [place]. She ***   ALLERGIES: Codeine, Escitalopram, Other, Oxycodone, Paxil [paroxetine hcl], and Procaine   MEDICATIONS:  Current Outpatient Medications  Medication Sig Dispense Refill   Abaloparatide (TYMLOS) 3120 MCG/1.56ML SOPN Inject 80 mcg into the skin daily. (Patient not taking: Reported on 09/10/2022) 46.8 mL 12   acetaminophen (TYLENOL) 500 MG tablet Take 500 mg by mouth. 3 -5 per  day (Patient not taking: Reported on 09/10/2022)     albuterol (PROVENTIL HFA;VENTOLIN HFA) 108 (90 BASE) MCG/ACT inhaler Inhale 2 puffs into the lungs every 6 (six) hours as needed for wheezing or shortness of breath.     allopurinol (ZYLOPRIM) 300 MG tablet Take 1 tablet (300 mg total) by mouth daily. 90 tablet 1   ALPRAZolam (XANAX) 0.5 MG tablet Take 1 tablet (0.5 mg total) by mouth 2 (two) times daily. (Patient taking differently: Take 0.5 mg by mouth 2 (two) times daily as needed for anxiety.) 60 tablet 0   aspirin 81 MG tablet Take 1 tablet (81 mg total) by mouth daily. 30 tablet    Biotin 96045 MCG TABS Take 10,000 mcg by mouth daily.  budesonide-formoterol (SYMBICORT) 80-4.5 MCG/ACT inhaler Take 2 puffs first thing in am and then another 2 puffs about 12 hours later. 1 each 12   calcium-vitamin D 250-100 MG-UNIT tablet Take 1 tablet by mouth 2 (two) times daily.     cefdinir (OMNICEF) 300 MG capsule Take 1 capsule (300 mg total) by mouth 2 (two) times daily. 20 capsule 0   diclofenac Sodium (VOLTAREN) 1 % GEL Apply 2-4 g topically 4 (four) times daily. APPLY 2-4 GRAMS TOPICALLY FOUR TIMES DAILY Strength: 1 % 200 g 3   digoxin (LANOXIN) 0.125 MG tablet Take 125 mcg by mouth daily.     dorzolamide (TRUSOPT) 2 % ophthalmic solution 1 drop 2 (two) times daily.     doxepin (SINEQUAN) 100 MG capsule Take 1 capsule (100 mg total) by mouth at bedtime. 90 capsule 1   famotidine (PEPCID) 20 MG tablet One after supper (Patient not taking: Reported on 09/10/2022) 30 tablet 11   Fluticasone-Umeclidin-Vilant (TRELEGY ELLIPTA) 100-62.5-25 MCG/ACT AEPB Inhale 1 puff into the lungs daily. (Patient not taking: Reported on 09/10/2022) 60 each 11   gabapentin (NEURONTIN) 300 MG capsule Take 1 capsule (300 mg total) by mouth 3 (three) times daily. 180 capsule 1   Insulin Pen Needle (ABOUTTIME PEN NEEDLE) 31G X 5 MM MISC 1 each by Does not apply route daily. (Patient not taking: Reported on 09/10/2022) 30 each 0    lubiprostone (AMITIZA) 24 MCG capsule Take 1 capsule (24 mcg total) by mouth 2 (two) times daily with a meal. 180 capsule 3   Multiple Vitamin (MULTIVITAMIN) tablet Take 1 tablet by mouth daily. (Patient not taking: Reported on 09/10/2022)     omeprazole (PRILOSEC OTC) 20 MG tablet Take 20 mg by mouth daily.     pantoprazole (PROTONIX) 40 MG tablet Take 1 tablet (40 mg total) by mouth daily. Take 30-60 min before first meal of the day (Patient not taking: Reported on 09/10/2022) 30 tablet 2   senna (SENOKOT) 8.6 MG tablet Take 5 tablets (43 mg total) by mouth daily. (Patient taking differently: Take 5 tablets by mouth 2 (two) times daily.)     Vitamin D, Ergocalciferol, (DRISDOL) 1.25 MG (50000 UNIT) CAPS capsule Take 1 capsule (50,000 Units total) by mouth every 7 (seven) days. 6 capsule 0   No current facility-administered medications for this visit.     REVIEW OF SYSTEMS: On review of systems, the patient reports that ***       PHYSICAL EXAM:  Wt Readings from Last 3 Encounters:  09/10/22 169 lb 9.6 oz (76.9 kg)  09/02/22 164 lb (74.4 kg)  08/18/22 165 lb (74.8 kg)   Temp Readings from Last 3 Encounters:  09/02/22 97.6 F (36.4 C)  08/06/22 97.6 F (36.4 C) (Oral)  05/29/22 (!) 97.4 F (36.3 C) (Oral)   BP Readings from Last 3 Encounters:  09/10/22 108/68  09/02/22 110/76  08/18/22 106/66   Pulse Readings from Last 3 Encounters:  09/10/22 87  09/02/22 64  08/06/22 67    /10  In general this is a well appearing caucasian female in no acute distress. She's alert and oriented x4 and appropriate throughout the examination. Cardiopulmonary assessment is negative for acute distress and she exhibits normal effort.     ECOG = ***  0 - Asymptomatic (Fully active, able to carry on all predisease activities without restriction)  1 - Symptomatic but completely ambulatory (Restricted in physically strenuous activity but ambulatory and able to carry out work of a light  or  sedentary nature. For example, light housework, office work)  2 - Symptomatic, <50% in bed during the day (Ambulatory and capable of all self care but unable to carry out any work activities. Up and about more than 50% of waking hours)  3 - Symptomatic, >50% in bed, but not bedbound (Capable of only limited self-care, confined to bed or chair 50% or more of waking hours)  4 - Bedbound (Completely disabled. Cannot carry on any self-care. Totally confined to bed or chair)  5 - Death   Santiago Glad MM, Creech RH, Tormey DC, et al. [phone]). "Toxicity and response criteria of the [facility]". Am. Evlyn Clines. Oncol. 5 (6): 649-55    LABORATORY DATA:  Lab Results  Component Value Date   WBC 6.5 09/02/2022   HGB 11.8 (L) 09/02/2022   HCT 36.5 09/02/2022   MCV 99.2 09/02/2022   PLT 186 09/02/2022   Lab Results  Component Value Date   NA 141 09/02/2022   K 4.3 09/02/2022   CL 104 09/02/2022   CO2 27 09/02/2022   Lab Results  Component Value Date   ALT 15 02/21/2021   AST 18 02/21/2021   ALKPHOS 64 02/21/2021   BILITOT 0.5 02/21/2021      RADIOGRAPHY: DG CHEST PORT 1 VIEW  Result Date: 09/02/2022 CLINICAL DATA:  Status post bronchoscopic biopsy EXAM: PORTABLE CHEST 1 VIEW COMPARISON:  Chest radiographs done on May 21 24, CT done on 07/25/2022 FINDINGS: Transverse diameter of heart is within normal limits. Thoracic aorta is tortuous and ectatic. There are no signs of pulmonary edema. There is 2.9 x 2.5 cm nodule in right upper lobe. Small linear densities in the lower lung fields suggest subsegmental atelectasis. There is no pleural effusion or pneumothorax. IMPRESSION: There is 2.9 cm nodule in right upper lobe suggesting neoplastic process. Small linear densities in the lower lung fields may suggest crowding of bronchovascular structures or subsegmental atelectasis. There is no pleural effusion or pneumothorax. Electronically Signed   By: Ernie Avena M.D.   On:  09/02/2022 10:08   DG C-ARM BRONCHOSCOPY  Result Date: 09/02/2022 C-ARM BRONCHOSCOPY: Fluoroscopy was utilized by the requesting physician.  No radiographic interpretation.       IMPRESSION/PLAN: 1. Stage IIB, cT1cN1M0, NSCLC, squamous cell carcinoma of the RUL. Dr. Mitzi Hansen discusses the pathology findings and reviews the nature of ***   We discussed the risks, benefits, short, and long term effects of radiotherapy, as well as the curative intent, and the patient is interested in proceeding. Dr. Mitzi Hansen discusses the delivery and logistics of radiotherapy and anticipates a course of *** weeks of radiotherapy to the RUL target and regional nodes of the chest. Written consent is obtained and placed in the chart, a copy was provided to the patient. The patient will be contacted to coordinate treatment planning by our simulation department. We anticipate starting therapy on ***   In a visit lasting *** minutes, greater than 50% of the time was spent face to face discussing the patient's condition, in preparation for the discussion, and coordinating the patient's care.   The above documentation reflects my direct findings during this shared patient visit. Please see the separate note by Dr. Mitzi Hansen on this date for the remainder of the patient's plan of care.    Osker Mason, [facility]   **Disclaimer: This note was dictated with voice recognition software. Similar sounding words can inadvertently be transcribed and this note may contain transcription errors which may not  have been corrected upon publication of note.**

## 2022-09-16 ENCOUNTER — Ambulatory Visit
Admission: RE | Admit: 2022-09-16 | Discharge: 2022-09-16 | Disposition: A | Discharge status: Home / Self Care | Payer: HMO | Source: Ambulatory Visit | Attending: Radiation Oncology | Admitting: Radiation Oncology

## 2022-09-16 ENCOUNTER — Other Ambulatory Visit: Payer: Self-pay | Admitting: Radiation Oncology

## 2022-09-16 VITALS — BP 119/70 | HR 84 | Temp 97.6°F | Resp 20 | Ht 66.0 in | Wt 171.0 lb

## 2022-09-16 DIAGNOSIS — R000 Tachycardia, unspecified: Secondary | ICD-10-CM | POA: Insufficient documentation

## 2022-09-16 DIAGNOSIS — G4730 Sleep apnea, unspecified: Secondary | ICD-10-CM | POA: Insufficient documentation

## 2022-09-16 DIAGNOSIS — I10 Essential (primary) hypertension: Secondary | ICD-10-CM | POA: Insufficient documentation

## 2022-09-16 DIAGNOSIS — Z7951 Long term (current) use of inhaled steroids: Secondary | ICD-10-CM | POA: Diagnosis not present

## 2022-09-16 DIAGNOSIS — Z85828 Personal history of other malignant neoplasm of skin: Secondary | ICD-10-CM | POA: Insufficient documentation

## 2022-09-16 DIAGNOSIS — M129 Arthropathy, unspecified: Secondary | ICD-10-CM | POA: Diagnosis not present

## 2022-09-16 DIAGNOSIS — J45909 Unspecified asthma, uncomplicated: Secondary | ICD-10-CM | POA: Diagnosis not present

## 2022-09-16 DIAGNOSIS — Z87891 Personal history of nicotine dependence: Secondary | ICD-10-CM | POA: Diagnosis not present

## 2022-09-16 DIAGNOSIS — C3411 Malignant neoplasm of upper lobe, right bronchus or lung: Secondary | ICD-10-CM | POA: Diagnosis not present

## 2022-09-16 DIAGNOSIS — E7800 Pure hypercholesterolemia, unspecified: Secondary | ICD-10-CM | POA: Diagnosis not present

## 2022-09-16 DIAGNOSIS — M797 Fibromyalgia: Secondary | ICD-10-CM | POA: Insufficient documentation

## 2022-09-16 DIAGNOSIS — R1310 Dysphagia, unspecified: Secondary | ICD-10-CM | POA: Insufficient documentation

## 2022-09-16 DIAGNOSIS — Z791 Long term (current) use of non-steroidal anti-inflammatories (NSAID): Secondary | ICD-10-CM | POA: Diagnosis not present

## 2022-09-16 DIAGNOSIS — C3490 Malignant neoplasm of unspecified part of unspecified bronchus or lung: Secondary | ICD-10-CM

## 2022-09-16 DIAGNOSIS — Z79899 Other long term (current) drug therapy: Secondary | ICD-10-CM | POA: Diagnosis not present

## 2022-09-16 DIAGNOSIS — M4854XA Collapsed vertebra, not elsewhere classified, thoracic region, initial encounter for fracture: Secondary | ICD-10-CM | POA: Insufficient documentation

## 2022-09-16 DIAGNOSIS — K5900 Constipation, unspecified: Secondary | ICD-10-CM | POA: Diagnosis not present

## 2022-09-16 DIAGNOSIS — K219 Gastro-esophageal reflux disease without esophagitis: Secondary | ICD-10-CM | POA: Diagnosis not present

## 2022-09-16 DIAGNOSIS — Z7982 Long term (current) use of aspirin: Secondary | ICD-10-CM | POA: Diagnosis not present

## 2022-09-17 ENCOUNTER — Inpatient Hospital Stay (HOSPITAL_BASED_OUTPATIENT_CLINIC_OR_DEPARTMENT_OTHER): Payer: HMO | Admitting: Internal Medicine

## 2022-09-17 ENCOUNTER — Inpatient Hospital Stay: Payer: HMO | Attending: Internal Medicine

## 2022-09-17 ENCOUNTER — Ambulatory Visit: Payer: HMO | Admitting: Pulmonary Disease

## 2022-09-17 ENCOUNTER — Encounter: Payer: Self-pay | Admitting: Internal Medicine

## 2022-09-17 ENCOUNTER — Other Ambulatory Visit: Payer: Self-pay | Admitting: Medical Oncology

## 2022-09-17 ENCOUNTER — Encounter (INDEPENDENT_AMBULATORY_CARE_PROVIDER_SITE_OTHER): Payer: Self-pay

## 2022-09-17 ENCOUNTER — Encounter: Payer: Self-pay | Admitting: Radiation Oncology

## 2022-09-17 VITALS — BP 120/74 | HR 66 | Temp 97.5°F | Resp 16 | Ht 66.0 in | Wt 170.0 lb

## 2022-09-17 DIAGNOSIS — Z87891 Personal history of nicotine dependence: Secondary | ICD-10-CM | POA: Insufficient documentation

## 2022-09-17 DIAGNOSIS — C3411 Malignant neoplasm of upper lobe, right bronchus or lung: Secondary | ICD-10-CM

## 2022-09-17 LAB — CMP (CANCER CENTER ONLY)
ALT: 9 U/L (ref 0–44)
AST: 11 U/L — ABNORMAL LOW (ref 15–41)
Albumin: 3.9 g/dL (ref 3.5–5.0)
Alkaline Phosphatase: 81 U/L (ref 38–126)
Anion gap: 6 (ref 5–15)
BUN: 19 mg/dL (ref 8–23)
CO2: 28 mmol/L (ref 22–32)
Calcium: 9.7 mg/dL (ref 8.9–10.3)
Chloride: 108 mmol/L (ref 98–111)
Creatinine: 0.69 mg/dL (ref 0.44–1.00)
GFR, Estimated: >60 mL/min (ref >60)
Glucose, Bld: 118 mg/dL — ABNORMAL HIGH (ref 70–99)
Potassium: 4.5 mmol/L (ref 3.5–5.1)
Sodium: 142 mmol/L (ref 135–145)
Total Bilirubin: 0.4 mg/dL (ref 0.3–1.2)
Total Protein: 6.9 g/dL (ref 6.5–8.1)

## 2022-09-17 LAB — CBC WITH DIFFERENTIAL (CANCER CENTER ONLY)
Abs Immature Granulocytes: 0.03 10*3/uL (ref 0.00–0.07)
Basophils Absolute: 0 10*3/uL (ref 0.0–0.1)
Basophils Relative: 0 %
Eosinophils Absolute: 0.3 10*3/uL (ref 0.0–0.5)
Eosinophils Relative: 5 %
HCT: 35.5 % — ABNORMAL LOW (ref 36.0–46.0)
Hemoglobin: 11.4 g/dL — ABNORMAL LOW (ref 12.0–15.0)
Immature Granulocytes: 1 %
Lymphocytes Relative: 28 %
Lymphs Abs: 1.7 10*3/uL (ref 0.7–4.0)
MCH: 31.8 pg (ref 26.0–34.0)
MCHC: 32.1 g/dL (ref 30.0–36.0)
MCV: 98.9 fL (ref 80.0–100.0)
Monocytes Absolute: 0.5 10*3/uL (ref 0.1–1.0)
Monocytes Relative: 9 %
Neutro Abs: 3.5 10*3/uL (ref 1.7–7.7)
Neutrophils Relative %: 57 %
Platelet Count: 178 10*3/uL (ref 150–400)
RBC: 3.59 MIL/uL — ABNORMAL LOW (ref 3.87–5.11)
RDW: 13.7 % (ref 11.5–15.5)
WBC Count: 6.1 10*3/uL (ref 4.0–10.5)
nRBC: 0 % (ref 0.0–0.2)

## 2022-09-17 MED ORDER — PROCHLORPERAZINE MALEATE 10 MG PO TABS: 10.0000 mg | ORAL_TABLET | Freq: Four times a day (QID) | ORAL | 1 refills | Status: DC | PRN

## 2022-09-17 NOTE — Progress Notes (Signed)
[facility] [phone]   [phone]  CONSULT NOTE  REFERRING PHYSICIAN: Dr. Elige Radon Icard  REASON FOR CONSULTATION:  87 years old white female recently diagnosed with lung cancer.  HPI Zoe [name] is a 87 y.o. female with past medical history significant for anxiety, asthma, atrial fibrillation, osteoarthritis, skin cancer, depression, GERD, fibromyalgia, sleep apnea as well as right rotator cuff surgery.  The patient also has long history of smoking but quit 3 years ago.  She has been complaining of cough that has been going on for several years but finally she was seen by her primary care provider and chest x-ray on 06/17/2022 showed 2.5 cm nodular lesion in the right midlung.  This was followed by CT scan of the chest without contrast on 07/25/2022 and it showed large spiculated solid nodule of the right upper lobe measuring 2.3 x 2.0 cm.  There was a small solid pulmonary nodule of the right middle lobe measuring 0.3 cm.  There was no enlarged lymph node seen in the chest on the CT scan of the chest without contrast.  Patient had a PET scan on August 14, 2022 and that showed the spiculated nodule inferior in the right upper lobe demonstrate intense hypermetabolic activity with SUV max of 11.8 consistent with bronchogenic carcinoma and it measured 2.3 x 2.3 cm.  There was a single small hypermetabolic right hilar lymph node with SUV max of 7.9 but no other hypermetabolic mediastinal, hilar or axillary lymph nodes.  There was no evidence of distant metastatic disease.  On September 02, 2022 the patient underwent video bronchoscopy with robotic assisted bronchoscopic navigation and EBUS under the care of Dr. Tonia Brooms.  The final pathology ([id number]; [id number]) from the right upper lobe needle biopsy as well as 10R lymph node showed malignant cells consistent with squamous cell carcinoma. Immunohistochemical stains are positive for p40 and negative for TTF-1.   The patient was referred to me today for evaluation and recommendation regarding treatment of her condition. When seen today she is very anxious and complaining of fatigue as well as cough and hoarseness of her voice but no significant chest pain, shortness of breath or hemoptysis.  She has no significant weight loss or night sweats.  She has intermittent nausea as well as chronic constipation.  She has headache with no visual changes. Family history significant for father died from stroke and mother died from drug complication at age 61. The patient is married and has 3 children.  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  She was accompanied today by her husband Zoe [name].  The patient has a history of smoking for around 7 years and quit 3 years ago.  She drinks alcohol occasionally and no history of drug abuse. HPI  Past Medical History:  Diagnosis Date   Abdominal discomfort 11/02/2012   suspected ?UTI- PCP MD placed on Cipro   Anxiety    Arthritis    Asthma    Cancer (HCC)    skin CA   Choking due to phlegm    Chronic constipation    Depression    Dysrhythmia    tachycardia   Environmental allergies    Fibromyalgia    GERD (gastroesophageal reflux disease)    Hemorrhoids    Hypercholesteremia    Hypertension    Sleep apnea     Past Surgical History:  Procedure Laterality Date   ANAL RECTAL MANOMETRY N/A 11/28/2013   Procedure: ANO RECTAL MANOMETRY;  Surgeon: Romie Levee, MD;  Location: [facility];  Service: Endoscopy;  Laterality: N/A;   APPENDECTOMY     BACK SURGERY     X2    BIOPSY  03/13/2021   Procedure: BIOPSY;  Surgeon: Malissa Hippo, MD;  Location: [facility];  Service: Endoscopy;;   BRONCHIAL BIOPSY  09/02/2022   Procedure: BRONCHIAL BIOPSIES;  Surgeon: Josephine Igo, DO;  Location: [facility];  Service: Pulmonary;;   BRONCHIAL BRUSHINGS  09/02/2022   Procedure: BRONCHIAL BRUSHINGS;  Surgeon: Josephine Igo, DO;  Location: [facility];   Service: Pulmonary;;   BRONCHIAL NEEDLE ASPIRATION BIOPSY  09/02/2022   Procedure: BRONCHIAL NEEDLE ASPIRATION BIOPSIES;  Surgeon: Josephine Igo, DO;  Location: [facility];  Service: Pulmonary;;   CHOLECYSTECTOMY     COLONOSCOPY  03/29/03   COLONOSCOPY N/A 12/09/2013   Procedure: COLONOSCOPY;  Surgeon: Malissa Hippo, MD;  Location: [facility];  Service: Endoscopy;  Laterality: N/A;  730   COLONOSCOPY N/A 02/04/2017   Procedure: COLONOSCOPY;  Surgeon: Malissa Hippo, MD;  Location: [facility];  Service: Endoscopy;  Laterality: N/A;  930   ENDOBRONCHIAL ULTRASOUND Bilateral 09/02/2022   Procedure: ENDOBRONCHIAL ULTRASOUND;  Surgeon: Josephine Igo, DO;  Location: [facility];  Service: Pulmonary;  Laterality: Bilateral;   ESOPHAGEAL DILATION N/A 03/13/2021   Procedure: ESOPHAGEAL DILATION;  Surgeon: Malissa Hippo, MD;  Location: [facility];  Service: Endoscopy;  Laterality: N/A;   ESOPHAGOGASTRODUODENOSCOPY (EGD) WITH PROPOFOL N/A 03/13/2021   Procedure: ESOPHAGOGASTRODUODENOSCOPY (EGD) WITH PROPOFOL;  Surgeon: Malissa Hippo, MD;  Location: [facility];  Service: Endoscopy;  Laterality: N/A;  8:05   EYE SURGERY     bilateral cataract removal   IR KYPHO LUMBAR INC FX REDUCE BONE BX UNI/BIL CANNULATION INC/IMAGING  11/24/2019   IR KYPHO THORACIC WITH BONE BIOPSY  08/23/2019   IR KYPHO THORACIC WITH BONE BIOPSY  11/24/2019   IR RADIOLOGIST EVAL & MGMT  08/03/2019   IR RADIOLOGIST EVAL & MGMT  09/29/2019   IR RADIOLOGIST EVAL & MGMT  10/18/2019   IR RADIOLOGIST EVAL & MGMT  04/11/2020   SKIN CANCER EXCISION     RIGHT NECK     TONSILLECTOMY     T+A   TOTAL HIP ARTHROPLASTY Right 09/14/2012   Procedure: RIGHT TOTAL HIP ARTHROPLASTY ANTERIOR APPROACH and RIGHT KNEE STEROID INJECTION;  Surgeon: Kathryne Hitch, MD;  Location: [facility];  Service: Orthopedics;  Laterality: Right;   TOTAL HIP ARTHROPLASTY Left 11/05/2012   Procedure: LEFT TOTAL HIP ARTHROPLASTY ANTERIOR APPROACH;   Surgeon: Kathryne Hitch, MD;  Location: [facility];  Service: Orthopedics;  Laterality: Left;   UMBILICAL HERNIA REPAIR  04/26/03   JENKINS   UPPER GASTROINTESTINAL ENDOSCOPY  08/03/2009   NUR   UPPER GASTROINTESTINAL ENDOSCOPY  03/29/2003   EGD ED TCS    Family History  Problem Relation Age of Onset   Healthy Son    Healthy Son    Healthy Daughter    Colon cancer Neg Hx     Social History Social History   Tobacco Use   Smoking status: Former    Current packs/day: 0.00    Average packs/day: 0.5 packs/day for 50.0 years (25.0 ttl pk-yrs)    Types: Cigarettes    Start date: 09/07/1970    Quit date: 09/06/2020    Years since quitting: 2.0   Smokeless tobacco: Never  Vaping Use   Vaping status: Never Used  Substance Use Topics  Alcohol use: Yes    Alcohol/week: 0.0 standard drinks of alcohol    Comment: rare   Drug use: No    Allergies  Allergen Reactions   Codeine Other (See Comments)    Pains in abdominal area   Other     NOVACAINE     ITCHING Anti-depressants   Oxycodone     Abdominal pain, chest pain   Paxil [Paroxetine Hcl] Itching   Procaine Itching    Current Outpatient Medications  Medication Sig Dispense Refill   Abaloparatide (TYMLOS) 3120 MCG/1.56ML SOPN Inject 80 mcg into the skin daily. (Patient not taking: Reported on 09/10/2022) 46.8 mL 12   acetaminophen (TYLENOL) 500 MG tablet Take 500 mg by mouth. 3 -5 per day     albuterol (PROVENTIL HFA;VENTOLIN HFA) 108 (90 BASE) MCG/ACT inhaler Inhale 2 puffs into the lungs every 6 (six) hours as needed for wheezing or shortness of breath.     allopurinol (ZYLOPRIM) 300 MG tablet Take 1 tablet (300 mg total) by mouth daily. 90 tablet 1   ALPRAZolam (XANAX) 0.5 MG tablet Take 1 tablet (0.5 mg total) by mouth 2 (two) times daily. (Patient taking differently: Take 0.5 mg by mouth 2 (two) times daily as needed for anxiety.) 60 tablet 0   aspirin 81 MG tablet Take 1 tablet (81 mg total) by mouth daily. 30  tablet    Biotin 40981 MCG TABS Take 10,000 mcg by mouth daily.     budesonide-formoterol (SYMBICORT) 80-4.5 MCG/ACT inhaler Take 2 puffs first thing in am and then another 2 puffs about 12 hours later. (Patient not taking: Reported on 09/16/2022) 1 each 12   calcium-vitamin D 250-100 MG-UNIT tablet Take 1 tablet by mouth 2 (two) times daily.     cefdinir (OMNICEF) 300 MG capsule Take 1 capsule (300 mg total) by mouth 2 (two) times daily. 20 capsule 0   diclofenac Sodium (VOLTAREN) 1 % GEL Apply 2-4 g topically 4 (four) times daily. APPLY 2-4 GRAMS TOPICALLY FOUR TIMES DAILY Strength: 1 % 200 g 3   digoxin (LANOXIN) 0.125 MG tablet Take 125 mcg by mouth daily.     dorzolamide (TRUSOPT) 2 % ophthalmic solution 1 drop 2 (two) times daily. (Patient not taking: Reported on 09/16/2022)     doxepin (SINEQUAN) 100 MG capsule Take 1 capsule (100 mg total) by mouth at bedtime. 90 capsule 1   famotidine (PEPCID) 20 MG tablet One after supper (Patient not taking: Reported on 09/10/2022) 30 tablet 11   Fluticasone-Umeclidin-Vilant (TRELEGY ELLIPTA) 100-62.5-25 MCG/ACT AEPB Inhale 1 puff into the lungs daily. 60 each 11   gabapentin (NEURONTIN) 300 MG capsule Take 1 capsule (300 mg total) by mouth 3 (three) times daily. 180 capsule 1   Insulin Pen Needle (ABOUTTIME PEN NEEDLE) 31G X 5 MM MISC 1 each by Does not apply route daily. (Patient not taking: Reported on 09/10/2022) 30 each 0   lubiprostone (AMITIZA) 24 MCG capsule Take 1 capsule (24 mcg total) by mouth 2 (two) times daily with a meal. 180 capsule 3   Multiple Vitamin (MULTIVITAMIN) tablet Take 1 tablet by mouth daily. (Patient not taking: Reported on 09/10/2022)     omeprazole (PRILOSEC OTC) 20 MG tablet Take 20 mg by mouth daily.     pantoprazole (PROTONIX) 40 MG tablet Take 1 tablet (40 mg total) by mouth daily. Take 30-60 min before first meal of the day (Patient not taking: Reported on 09/10/2022) 30 tablet 2   senna (SENOKOT) 8.6 MG tablet Take  5  tablets (43 mg total) by mouth daily. (Patient taking differently: Take 5 tablets by mouth 2 (two) times daily.)     Vitamin D, Ergocalciferol, (DRISDOL) 1.25 MG (50000 UNIT) CAPS capsule Take 1 capsule (50,000 Units total) by mouth every 7 (seven) days. 6 capsule 0   No current facility-administered medications for this visit.    Review of Systems  Constitutional: positive for fatigue Eyes: negative Ears, nose, mouth, throat, and face: negative Respiratory: positive for cough Cardiovascular: negative Gastrointestinal: positive for constipation and nausea Genitourinary:negative Integument/breast: negative Hematologic/lymphatic: negative Musculoskeletal:negative Neurological: positive for headaches Behavioral/Psych: negative Endocrine: negative Allergic/Immunologic: negative  Physical Exam  GEX:BMWUX, healthy, no distress, well nourished, well developed, and anxious SKIN: skin color, texture, turgor are normal, no rashes or significant lesions HEAD: Normocephalic, No masses, lesions, tenderness or abnormalities EYES: normal, PERRLA, Conjunctiva are pink and non-injected EARS: External ears normal, Canals clear OROPHARYNX:no exudate, no erythema, and lips, buccal mucosa, and tongue normal  NECK: supple, no adenopathy, no JVD LYMPH:  no palpable lymphadenopathy, no hepatosplenomegaly BREAST:not examined LUNGS: clear to auscultation , and palpation HEART: regular rate & rhythm, no murmurs, and no gallops ABDOMEN:abdomen soft, non-tender, normal bowel sounds, and no masses or organomegaly BACK: Back symmetric, no curvature., No CVA tenderness EXTREMITIES:no joint deformities, effusion, or inflammation, no edema  NEURO: alert & oriented x 3 with fluent speech, no focal motor/sensory deficits  PERFORMANCE STATUS: ECOG 1  LABORATORY DATA: Lab Results  Component Value Date   WBC 6.1 09/17/2022   HGB 11.4 (L) 09/17/2022   HCT 35.5 (L) 09/17/2022   MCV 98.9 09/17/2022   PLT 178  09/17/2022      Chemistry      Component Value Date/Time   NA 142 09/17/2022 1125   NA 141 03/29/2020 1146   K 4.5 09/17/2022 1125   CL 108 09/17/2022 1125   CO2 28 09/17/2022 1125   BUN 19 09/17/2022 1125   BUN 13 03/29/2020 1146   CREATININE 0.69 09/17/2022 1125      Component Value Date/Time   CALCIUM 9.7 09/17/2022 1125   ALKPHOS 81 09/17/2022 1125   AST 11 (L) 09/17/2022 1125   ALT 9 09/17/2022 1125   BILITOT 0.4 09/17/2022 1125       RADIOGRAPHIC STUDIES: DG CHEST PORT 1 VIEW  Result Date: 09/02/2022 CLINICAL DATA:  Status post bronchoscopic biopsy EXAM: PORTABLE CHEST 1 VIEW COMPARISON:  Chest radiographs done on May 21 24, CT done on 07/25/2022 FINDINGS: Transverse diameter of heart is within normal limits. Thoracic aorta is tortuous and ectatic. There are no signs of pulmonary edema. There is 2.9 x 2.5 cm nodule in right upper lobe. Small linear densities in the lower lung fields suggest subsegmental atelectasis. There is no pleural effusion or pneumothorax. IMPRESSION: There is 2.9 cm nodule in right upper lobe suggesting neoplastic process. Small linear densities in the lower lung fields may suggest crowding of bronchovascular structures or subsegmental atelectasis. There is no pleural effusion or pneumothorax. Electronically Signed   By: Ernie Avena M.D.   On: 09/02/2022 10:08   DG C-ARM BRONCHOSCOPY  Result Date: 09/02/2022 C-ARM BRONCHOSCOPY: Fluoroscopy was utilized by the requesting physician.  No radiographic interpretation.    ASSESSMENT: This is a very pleasant 87 years old white female recently diagnosed with stage IIb (T1c, N1, M0) non-small cell lung cancer, squamous cell carcinoma presented with right upper lobe lung nodule in addition to right hilar lymphadenopathy diagnosed in August 2024. PD-L1 expression is still pending  PLAN: I had a lengthy discussion with the patient and her husband today about her current disease stage, prognosis and  treatment options. I personally and independently reviewed the scan images as well as the pathology report with the patient and her husband. The patient is not a great candidate for surgical resection because of her age and other comorbidities. I recommended for her a course of concurrent chemoradiation with weekly carboplatin for AUC of 2 and paclitaxel 45 Mg/M2 for 6-7 weeks. I discussed with the patient the adverse effect of the chemotherapy including but not limited to alopecia, low suppression, nausea and vomiting, peripheral neuropathy, liver or renal dysfunction. She is expected to start the first dose of this treatment in early September 2024.  The patient was seen by radiation oncology and expected to start radiotherapy at that time. She will have MRI of the brain to complete the staging workup of her disease.  This was ordered by radiation oncology. I will see the patient back for follow-up visit with the first dose of her treatment. She will have a chemotherapy education class before the first dose of her treatment. I will call her pharmacy with prescription for Compazine 10 mg p.o. every 6 hours as needed for nausea. The patient was advised to call immediately if she has any other concerning symptoms in the interval.  The patient voices understanding of current disease status and treatment options and is in agreement with the current care plan.  All questions were answered. The patient knows to call the clinic with any problems, questions or concerns. We can certainly see the patient much sooner if necessary.  Thank you so much for allowing me to participate in the care of [facility]. I will continue to follow up the patient with you and assist in her care.  The total time spent in the appointment was 90 minutes.  Disclaimer: This note was dictated with voice recognition software. Similar sounding words can inadvertently be transcribed and may not be corrected upon  review.   Lajuana Matte September 17, 2022, 12:37 PM

## 2022-09-17 NOTE — Progress Notes (Signed)
START ON PATHWAY REGIMEN - Non-Small Cell Lung     A cycle is every 7 days, concurrent with RT:     Paclitaxel      Carboplatin   **Always confirm dose/schedule in your pharmacy ordering system**  Patient Characteristics: Preoperative or Nonsurgical Candidate (Clinical Staging), Stage II, Nonsurgical Candidate Therapeutic Status: Preoperative or Nonsurgical Candidate (Clinical Staging) AJCC T Category: cT1c AJCC N Category: cN1 AJCC M Category: cM0 AJCC 8 Stage Grouping: IIB Intent of Therapy: Curative Intent, Discussed with Patient

## 2022-09-18 ENCOUNTER — Ambulatory Visit (INDEPENDENT_AMBULATORY_CARE_PROVIDER_SITE_OTHER): Payer: HMO | Admitting: Gastroenterology

## 2022-09-18 ENCOUNTER — Ambulatory Visit: Payer: HMO | Admitting: Physician Assistant

## 2022-09-18 ENCOUNTER — Other Ambulatory Visit: Payer: Self-pay

## 2022-09-18 ENCOUNTER — Encounter: Payer: Self-pay | Admitting: *Deleted

## 2022-09-18 ENCOUNTER — Encounter (INDEPENDENT_AMBULATORY_CARE_PROVIDER_SITE_OTHER): Payer: Self-pay | Admitting: Gastroenterology

## 2022-09-18 ENCOUNTER — Encounter: Payer: Self-pay | Admitting: Internal Medicine

## 2022-09-18 VITALS — BP 138/72 | HR 94 | Temp 97.5°F | Ht 66.0 in | Wt 169.2 lb

## 2022-09-18 DIAGNOSIS — R490 Dysphonia: Secondary | ICD-10-CM | POA: Insufficient documentation

## 2022-09-18 DIAGNOSIS — R070 Pain in throat: Secondary | ICD-10-CM | POA: Diagnosis not present

## 2022-09-18 DIAGNOSIS — R09A2 Foreign body sensation, throat: Secondary | ICD-10-CM

## 2022-09-18 DIAGNOSIS — C3411 Malignant neoplasm of upper lobe, right bronchus or lung: Secondary | ICD-10-CM

## 2022-09-18 DIAGNOSIS — K581 Irritable bowel syndrome with constipation: Secondary | ICD-10-CM

## 2022-09-18 DIAGNOSIS — K219 Gastro-esophageal reflux disease without esophagitis: Secondary | ICD-10-CM | POA: Diagnosis not present

## 2022-09-18 DIAGNOSIS — K2270 Barrett's esophagus without dysplasia: Secondary | ICD-10-CM

## 2022-09-18 DIAGNOSIS — R1310 Dysphagia, unspecified: Secondary | ICD-10-CM

## 2022-09-18 DIAGNOSIS — R1319 Other dysphagia: Secondary | ICD-10-CM

## 2022-09-18 MED ORDER — OMEPRAZOLE 40 MG PO CPDR
40.0000 mg | DELAYED_RELEASE_CAPSULE | Freq: Every day | ORAL | 3 refills | Status: DC
Start: 2022-09-18 — End: 2023-04-06

## 2022-09-18 NOTE — Patient Instructions (Signed)
Increase omeprazole to 40 mg qday Schedule EGD

## 2022-09-18 NOTE — Progress Notes (Signed)
This NN was able to book pt's brain MRI at [facility] for 8/31 at 1pm and reached out to the pt at 10:20 to notify the pt and her husband. Pt and husband verbalized understanding of time and location of appt. Pt's husband, Rosanne Ashing, states that he and the pt are still considering looking into getting the pts care at the cancer center in [place]. This NN verbalized understanding. At 12:38, this NN received VM from pt's husband that he reached out to his insurance company and found out that since [facility] is considered in-network and much closer to home, the pt would like to receive her cancer care there.  At 1pm this NN called the pt and her husband and informed them that a referral will be placed for the pt to receive her Med Onc and Rad Onc care at [facility]. This NN had already reached out to [employer], [place], to notify of pt's decision. CT Sim on 8/30 cancelled. This NN let the pt know that her CT sim has been cancelled, but to keep the brain MRI appt, which was able to be rescheduled for 8/24. Pt and husband verbalized understanding and appreciation of the oncology teams assistance. This NN encouraged pt to call with any concerns.

## 2022-09-18 NOTE — Progress Notes (Signed)
Katrinka Blazing, M.D. Gastroenterology & Hepatology [facility] [address] [place], [place] [address]  Primary Care Physician: Ignatius Specking, MD [address] [place] [address]  I will communicate my assessment and recommendations to the referring MD via EMR.  Problems: GERD Short segment Barrett's esophagus IBS-C   History of Present Illness: ORINE BADDER is a 87 y.o. female with PMH recently diagnosed lung cancer, GERD c/d BE, IBS-C, who presents for follow up of dysphagia and throat discomfort.  The patient was last seen on 05/29/2022. At that time, the patient was continued on Amitiza 24 mcg twice a day and senna as needed, was also continued on omeprazole 20 mg every day.  Patient reports that she has felt frequent hoarseness and feeling globus sensation for the last 3-4 years. Has to clear her throat frequently. States she has felt some issues with dysphagia, especially when eating bread or eating fast. She has had to vomit the food a few times, but this is not happening often. Sometimes she feels like her throat closes suddenly without a clear cause.  The patient denies having any nausea, vomiting, fever, chills, hematochezia, melena, hematemesis, abdominal distention, abdominal pain, diarrhea, jaundice, pruritus or weight loss.  Patient is scheduled to start chemoradiation soon, possibly early September 2024.  Not undergoing surgical resection given her age and comorbidities.  I was reached by the radiation oncology team regarding the possibility of performing an endoscopic evaluation and possible dilation prior to starting chemoradiation.  Esophagram performed on 07/13/2020 - age related dysmotility, mild laryngeal penetration.   S/S evaluation on 08/08/2020 Pt presents with occasional flash penetration of thin liquids with no aspiration. Oral stage was unremarkable. Note good laryngeal excursion. Question slightly decreased  pharyngeal squeeze resulting in trace valleculae and pyriform residue across presentations. Pt consumed barium tablet with thin liquids without incident - esophageal sweep was unremarkable. SLP reviewed universal aspriation precautions with the Pt. Pt reports consistent globus sensation and that when she coughs or clears her throat that she ofted expectorates large amounts of phlegm. Recommend continue with a regular diet and thin liquids; meds are ok whole with liquids.   Last EGD: 03/13/2021 - Normal hypopharynx. - Normal esophagus. - Z-line irregular, 38 cm from the incisors with texture change. Biopsied post dilation. - 2 cm hiatal hernia. - No endoscopic esophageal abnormality to explain patient's dysphagia. Esophagus dilated. - Gastritis. Biopsied. - Normal duodenal bulb and second portion of the duodenum.   Path: A. ANTRUM, BIOPSY:  - Gastric antral mucosa with mild nonspecific reactive gastropathy  - Helicobacter pylori-like organisms are not identified on routine HE  stain   B. STOMACH, BODY, BIOPSY:  - Gastric oxyntic mucosa with parietal cell hyperplasia as can be seen  in hypergastrinemic states such as PPI therapy.  - Helicobacter pylori-like organisms are not identified on routine HE  stain   C. GE JUNCTION, BIOPSY:  - Esophageal squamous and cardiac mucosa with focal intestinal  metaplasia. See note  - Negative for dysplasia    Last Colonoscopy: 2019 - Preparation of the colon was fair. - Melanosis in the colon. - External hemorrhoids.  Past Medical History: Past Medical History:  Diagnosis Date   Abdominal discomfort 11/02/2012   suspected ?UTI- PCP MD placed on Cipro   Anxiety    Arthritis    Asthma    Cancer (HCC)    skin CA   Choking due to phlegm    Chronic constipation  Depression    Dysrhythmia    tachycardia   Environmental allergies    Fibromyalgia    GERD (gastroesophageal reflux disease)    Hemorrhoids    Hypercholesteremia     Hypertension    Sleep apnea     Past Surgical History: Past Surgical History:  Procedure Laterality Date   ANAL RECTAL MANOMETRY N/A 11/28/2013   Procedure: ANO RECTAL MANOMETRY;  Surgeon: Romie Levee, MD;  Location: [facility];  Service: Endoscopy;  Laterality: N/A;   APPENDECTOMY     BACK SURGERY     X2    BIOPSY  03/13/2021   Procedure: BIOPSY;  Surgeon: Malissa Hippo, MD;  Location: [facility];  Service: Endoscopy;;   BRONCHIAL BIOPSY  09/02/2022   Procedure: BRONCHIAL BIOPSIES;  Surgeon: Josephine Igo, DO;  Location: [facility];  Service: Pulmonary;;   BRONCHIAL BRUSHINGS  09/02/2022   Procedure: BRONCHIAL BRUSHINGS;  Surgeon: Josephine Igo, DO;  [facility]: [facility];  Service: Pulmonary;;   BRONCHIAL NEEDLE ASPIRATION BIOPSY  09/02/2022   Procedure: BRONCHIAL NEEDLE ASPIRATION BIOPSIES;  Surgeon: Josephine Igo, DO;  [facility]: [facility];  Service: Pulmonary;;   CHOLECYSTECTOMY     COLONOSCOPY  03/29/03   COLONOSCOPY N/A 12/09/2013   Procedure: COLONOSCOPY;  Surgeon: Malissa Hippo, MD;  [facility];  Service: Endoscopy;  Laterality: N/A;  730   COLONOSCOPY N/A 02/04/2017   Procedure: COLONOSCOPY;  Surgeon: Malissa Hippo, MD;  [facility]: [facility];  Service: Endoscopy;  Laterality: N/A;  930   ENDOBRONCHIAL ULTRASOUND Bilateral 09/02/2022   Procedure: ENDOBRONCHIAL ULTRASOUND;  Surgeon: Josephine Igo, DO;  [facility]: [facility];  Service: Pulmonary;  Laterality: Bilateral;   ESOPHAGEAL DILATION N/A 03/13/2021   Procedure: ESOPHAGEAL DILATION;  Surgeon: Malissa Hippo, MD;  [facility];  Service: Endoscopy;  Laterality: N/A;   ESOPHAGOGASTRODUODENOSCOPY (EGD) WITH PROPOFOL N/A 03/13/2021   Procedure: ESOPHAGOGASTRODUODENOSCOPY (EGD) WITH PROPOFOL;  Surgeon: Malissa Hippo, MD;  [facility]: [facility];  Service: Endoscopy;  Laterality: N/A;  8:05   EYE SURGERY     bilateral cataract removal   IR KYPHO LUMBAR INC FX REDUCE BONE BX  UNI/BIL CANNULATION INC/IMAGING  11/24/2019   IR KYPHO THORACIC WITH BONE BIOPSY  08/23/2019   IR KYPHO THORACIC WITH BONE BIOPSY  11/24/2019   IR RADIOLOGIST EVAL & MGMT  08/03/2019   IR RADIOLOGIST EVAL & MGMT  09/29/2019   IR RADIOLOGIST EVAL & MGMT  10/18/2019   IR RADIOLOGIST EVAL & MGMT  04/11/2020   SKIN CANCER EXCISION     RIGHT NECK     TONSILLECTOMY     T+A   TOTAL HIP ARTHROPLASTY Right 09/14/2012   Procedure: RIGHT TOTAL HIP ARTHROPLASTY ANTERIOR APPROACH and RIGHT KNEE STEROID INJECTION;  Surgeon: Kathryne Hitch, MD;  [facility]: [facility];  Service: Orthopedics;  Laterality: Right;   TOTAL HIP ARTHROPLASTY Left 11/05/2012   Procedure: LEFT TOTAL HIP ARTHROPLASTY ANTERIOR APPROACH;  Surgeon: Kathryne Hitch, MD;  Location: [facility]S;  Service: Orthopedics;  Laterality: Left;   UMBILICAL HERNIA REPAIR  04/26/03   JENKINS   UPPER GASTROINTESTINAL ENDOSCOPY  08/03/2009   NUR   UPPER GASTROINTESTINAL ENDOSCOPY  03/29/2003   EGD ED TCS    Family History: Family History  Problem Relation Age of Onset   Healthy Son    Healthy Son    Healthy Daughter    Colon cancer Neg Hx     Social History: Social History  Tobacco Use  Smoking Status Former   Current packs/day: 0.00   Average packs/day: 0.5 packs/day for 50.0 years (25.0 ttl pk-yrs)   Types: Cigarettes   Start date: 09/07/1970   Quit date: 09/06/2020   Years since quitting: 2.0  Smokeless Tobacco Never   Social History   Substance and Sexual Activity  Alcohol Use Yes   Alcohol/week: 0.0 standard drinks of alcohol   Comment: rare   Social History   Substance and Sexual Activity  Drug Use No    Allergies: Allergies  Allergen Reactions   Codeine Other (See Comments)    Pains in abdominal area   Other     NOVACAINE     ITCHING Anti-depressants   Oxycodone     Abdominal pain, chest pain   Paxil [Paroxetine Hcl] Itching   Procaine Itching    Medications: Current Outpatient Medications   Medication Sig Dispense Refill   acetaminophen (TYLENOL) 500 MG tablet Take 500 mg by mouth. 3 -5 per day     albuterol (PROVENTIL HFA;VENTOLIN HFA) 108 (90 BASE) MCG/ACT inhaler Inhale 2 puffs into the lungs every 6 (six) hours as needed for wheezing or shortness of breath.     allopurinol (ZYLOPRIM) 300 MG tablet Take 1 tablet (300 mg total) by mouth daily. 90 tablet 1   ALPRAZolam (XANAX) 0.5 MG tablet Take 1 tablet (0.5 mg total) by mouth 2 (two) times daily. (Patient taking differently: Take 0.5 mg by mouth 2 (two) times daily as needed for anxiety.) 60 tablet 0   aspirin 81 MG tablet Take 1 tablet (81 mg total) by mouth daily. 30 tablet    Biotin 40981 MCG TABS Take 10,000 mcg by mouth daily.     calcium-vitamin D 250-100 MG-UNIT tablet Take 1 tablet by mouth daily at 6 (six) AM.     Cranberry-Vitamin C (CRANBERRY CONCENTRATE/VITAMINC PO) Take 250 mg by mouth daily.     cyanocobalamin 2000 MCG tablet Take 5,000 mcg by mouth daily.     diclofenac Sodium (VOLTAREN) 1 % GEL Apply 2-4 g topically 4 (four) times daily. APPLY 2-4 GRAMS TOPICALLY FOUR TIMES DAILY Strength: 1 % 200 g 3   digoxin (LANOXIN) 0.125 MG tablet Take 125 mcg by mouth daily.     doxepin (SINEQUAN) 100 MG capsule Take 1 capsule (100 mg total) by mouth at bedtime. 90 capsule 1   Fluticasone-Umeclidin-Vilant (TRELEGY ELLIPTA) 100-62.5-25 MCG/ACT AEPB Inhale 1 puff into the lungs daily. 60 each 11   gabapentin (NEURONTIN) 300 MG capsule Take 1 capsule (300 mg total) by mouth 3 (three) times daily. (Patient taking differently: Take 300 mg by mouth 2 (two) times daily.) 180 capsule 1   levocetirizine (XYZAL) 5 MG tablet Take 5 mg by mouth every evening.     lubiprostone (AMITIZA) 24 MCG capsule Take 1 capsule (24 mcg total) by mouth 2 (two) times daily with a meal. 180 capsule 3   meloxicam (MOBIC) 15 MG tablet Take 15 mg by mouth daily.     omeprazole (PRILOSEC OTC) 20 MG tablet Take 20 mg by mouth daily.     OVER THE COUNTER  MEDICATION Take 1 tablet by mouth daily. OTC "potassium 99 mg"     OVER THE COUNTER MEDICATION Take 1 tablet by mouth daily. " Magnesium 400 mg"     OVER THE COUNTER MEDICATION Take 1 Dose by mouth daily. " Apple cider vinegar"     pravastatin (PRAVACHOL) 20 MG tablet Take 20 mg by mouth daily.  Travoprost, BAK Free, (TRAVATAN) 0.004 % SOLN ophthalmic solution Place 1 drop into both eyes at bedtime.     prochlorperazine (COMPAZINE) 10 MG tablet Take 1 tablet (10 mg total) by mouth every 6 (six) hours as needed for nausea or vomiting. (Patient not taking: Reported on 09/18/2022) 30 tablet 1   No current facility-administered medications for this visit.    Review of Systems: GENERAL: negative for malaise, night sweats HEENT: No changes in hearing or vision, no nose bleeds or other nasal problems. NECK: Negative for lumps, goiter, pain and significant neck swelling RESPIRATORY: Negative for cough, wheezing CARDIOVASCULAR: Negative for chest pain, leg swelling, palpitations, orthopnea GI: SEE HPI MUSCULOSKELETAL: Negative for joint pain or swelling, back pain, and muscle pain. SKIN: Negative for lesions, rash PSYCH: Negative for sleep disturbance, mood disorder and recent psychosocial stressors. HEMATOLOGY Negative for prolonged bleeding, bruising easily, and swollen nodes. ENDOCRINE: Negative for cold or heat intolerance, polyuria, polydipsia and goiter. NEURO: negative for tremor, gait imbalance, syncope and seizures. The remainder of the review of systems is noncontributory.   Physical Exam: BP 138/72 (BP Location: Left Arm, Patient Position: Sitting, Cuff Size: Large)   Pulse 94   Temp (!) 97.5 F (36.4 C) (Temporal)   Ht 5\' 6"  (1.676 m)   Wt 169 lb 3.2 oz (76.7 kg)   BMI 27.31 kg/m  GENERAL: The patient is AO x3, in no acute distress. HEENT: Head is normocephalic and atraumatic. EOMI are intact. Mouth is well hydrated and without lesions. NECK: Supple. No masses LUNGS: Clear  to auscultation. No presence of rhonchi/wheezing/rales. Adequate chest expansion HEART: RRR, normal s1 and s2. ABDOMEN: Soft, nontender, no guarding, no peritoneal signs, and nondistended. BS +. No masses. EXTREMITIES: Without any cyanosis, clubbing, rash, lesions or edema. NEUROLOGIC: AOx3, no focal motor deficit. SKIN: no jaundice, no rashes  Imaging/Labs: as above  I personally reviewed and interpreted the available labs, imaging and endoscopic files.  Impression and Plan: Zoe [name] is a 87 y.o. female with PMH recently diagnosed lung cancer, GERD c/d BE, IBS-C, who presents for follow up of dysphagia and throat discomfort.  The patient has presented some occasional episodes of dysphagia but this is not present on a regular basis.  Her main concern at the moment is her throat discomfort, which does not cure only when swallowing food.  Her symptoms are likely related to globus sensation as she has had previous endoscopic evaluations that have not shown any endoluminal abnormalities.  We discussed that these are symptoms are not typical for GERD alone, but we could attempt increasing her omeprazole to 40 mg every day and we will assess symptom improvement on follow-up.  She is interested in undergoing a repeat EGD for evaluation and management of her dysphagia, which I find reasonable.  We discussed that some of her symptoms are related partially to a pharyngeal esophageal component, but also she has age-related dysmotility changes.  Patient understood and agreed.  -Increase omeprazole to 40 mg qday -Schedule EGD  All questions were answered.      Katrinka Blazing, MD Gastroenterology and Hepatology [facility]

## 2022-09-18 NOTE — Progress Notes (Signed)
This NN met with the pt face to face today at her med onc consult with Dr.Mohamed. Pt was accompanied by her husband, Zoe [name].  Plan for the pt is chemotherapy with concurrent radiation. When told of the schedule of the regimen, the pt and her husband appeared concerned. The pt lives in [place] and states that it's a drive to and from [facility], and while they are much closer to [facility] or [facility], the pt can't get radiation at [facility] and pt's husband isn't sure if [facility] is in-network with their insurance. This NN verbalized understanding and suggests that before moving forward here, they reach out to [facility] and their insurance company if [facility] is in-network for them. To continue the pts work-up, NN will schedule brain MRI at [facility]. Pt provided with Lung Cancer Journey Book and business card with direct contact information. No additional questions at the conclusion of the appointment.

## 2022-09-19 ENCOUNTER — Encounter: Payer: Self-pay | Admitting: Internal Medicine

## 2022-09-19 NOTE — Progress Notes (Signed)
8/22: This NN faxed pt's referral to [facility] at 15:18 to [phone]. Tx successful notification received at 15:29.

## 2022-09-19 NOTE — Addendum Note (Signed)
Addended by: Dolores Frame on: 09/19/2022 01:56 PM   Modules accepted: Level of Service

## 2022-09-19 NOTE — Progress Notes (Signed)
The proposed treatment discussed in conference is for discussion purpose only and is not a binding recommendation.  The patients have not been physically examined, or presented with their treatment options.  Therefore, final treatment plans cannot be decided.  

## 2022-09-20 ENCOUNTER — Ambulatory Visit (HOSPITAL_COMMUNITY): Admission: RE | Admit: 2022-09-20 | Payer: HMO | Source: Ambulatory Visit

## 2022-09-20 DIAGNOSIS — I6782 Cerebral ischemia: Secondary | ICD-10-CM | POA: Diagnosis not present

## 2022-09-20 DIAGNOSIS — C3490 Malignant neoplasm of unspecified part of unspecified bronchus or lung: Secondary | ICD-10-CM

## 2022-09-20 MED ORDER — GADOBUTROL 1 MMOL/ML IV SOLN
9.0000 mL | Freq: Once | INTRAVENOUS | Status: AC | PRN
  Administered 2022-09-20: 9 mL via INTRAVENOUS

## 2022-09-22 ENCOUNTER — Telehealth: Payer: Self-pay | Admitting: Acute Care

## 2022-09-22 ENCOUNTER — Encounter: Payer: Self-pay | Admitting: *Deleted

## 2022-09-22 ENCOUNTER — Telehealth: Payer: Self-pay | Admitting: Radiation Oncology

## 2022-09-22 NOTE — Telephone Encounter (Signed)
I called Zoe [name] and had to leave a voicemail giving her the reassuring results of her MRI, and discussed the small vessel changes should be followed up by her PCP if she develops neurologic symptoms. She will otherwise proceed with treatment in [place] and we will be available as needed.

## 2022-09-23 ENCOUNTER — Telehealth: Payer: Self-pay | Admitting: Sports Medicine

## 2022-09-23 NOTE — Telephone Encounter (Signed)
Pt called stating she had injection in both shoulder and the left is still in pain and need a call back for medical advice. Please call pt at [phone].

## 2022-09-24 ENCOUNTER — Ambulatory Visit: Payer: HMO | Admitting: Internal Medicine

## 2022-09-24 ENCOUNTER — Encounter: Payer: HMO | Admitting: Thoracic Surgery (Cardiothoracic Vascular Surgery)

## 2022-09-24 ENCOUNTER — Other Ambulatory Visit: Payer: HMO

## 2022-09-24 NOTE — Pre-Procedure Instructions (Signed)
  RE: AFib. Received: Today Dolores Frame, MD  Elsie Amis, RN; Marlowe Shores, LPN; Armstead Peaks, CMA Hi, She needs to be seen by cardiologist first, will need to reschedule Thanks       Previous Messages

## 2022-09-24 NOTE — Pre-Procedure Instructions (Signed)
Spoke with patient who states she had a lung procedure done on 8/6 and went in to AFIB. She states she is to see cardiology on 9/5 and her procedure is 9/3. She didn't know if she shuold have procedure done before her appointment. Staff messaged Dr Earmon Phoenix to see what he wants to do.

## 2022-09-25 ENCOUNTER — Telehealth: Payer: Self-pay | Admitting: *Deleted

## 2022-09-25 ENCOUNTER — Telehealth (INDEPENDENT_AMBULATORY_CARE_PROVIDER_SITE_OTHER): Payer: Self-pay | Admitting: Gastroenterology

## 2022-09-25 ENCOUNTER — Encounter (HOSPITAL_COMMUNITY)
Admission: RE | Admit: 2022-09-25 | Discharge: 2022-09-25 | Disposition: A | Discharge status: Home / Self Care | Payer: HMO | Source: Ambulatory Visit | Attending: Gastroenterology | Admitting: Gastroenterology

## 2022-09-25 NOTE — Telephone Encounter (Signed)
    09/25/22  [name] Zoe [name] [birth year]  What type of surgery is being performed? EGD +/- Dilations  When is surgery scheduled? TBD  What type of clearance is required (medical or pharmacy to hold medication or both? Cardiology Clearance  Are there any medications that need to be held prior to surgery and how long?   Name of physician performing surgery?  Dr. Katrinka Blazing [facility] at [facility] Phone: [phone] Fax: [phone]  Anethesia type (none, local, MAC, general)? MAC  Pt has appt on 10/02/22 with Dr. Jenene Slicker

## 2022-09-25 NOTE — Telephone Encounter (Signed)
Called pt and was advised not available by spouse Fayrene Fearing. He is on DPR. I advised procedure cancelled for now and will have to see cardiology 1st. He voiced understanding and will inform pt.

## 2022-09-25 NOTE — Telephone Encounter (Signed)
   Name: Zoe [name]  DOB: 1935-06-26  MRN: [id number]  Primary Cardiologist: None  Chart reviewed as part of pre-operative protocol coverage. The patient has an upcoming visit scheduled with Dr. Jenene Slicker on 10/02/2022 at which time clearance can be addressed in case there are any issues that would impact surgical recommendations.  EGD is not scheduled until TBD as below. I added preop FYI to appointment note so that provider is aware to address at time of outpatient visit.  Per office protocol the cardiology provider should forward their finalized clearance decision and recommendations regarding antiplatelet therapy to the requesting party below.     I will route this message as FYI to requesting party and remove this message from the preop box as separate preop APP input not needed at this time.   Please call with any questions.  Joylene Grapes, NP  09/25/2022, 2:00 PM

## 2022-09-25 NOTE — Telephone Encounter (Signed)
Zoe Frame, MD  Elsie Amis, RN; Marlowe Shores, LPN; Armstead Peaks, CMA Hi, She needs to be seen by cardiologist first, will need to reschedule Thanks       Previous Messages    ----- Message ----- From: Elsie Amis, RN Sent: 09/24/2022   2:41 PM EDT To: Mindy S Estudillo, CMA; Marlowe Shores, LPN; * Subject: AFib.                                          Good afternoon! We just got off the phone with [name]. She had some lung biopsies done 09/02/2022 and went into Afib during that procedure. She dose not have a cardiology appointment for this new onset until Sept 5. Do you still want to proceed with her procedure on 9/3 or wait until she has seen cardiology? Dr Alva Garnet will be the ologist next week.   Pt contacted and verbalized understanding. Will go ahead and send cardiology clearance also.

## 2022-09-25 NOTE — Telephone Encounter (Signed)
-----   Message from Katrinka Blazing Mayorga sent at 09/24/2022  2:50 PM EDT ----- Regarding: RE: AFib. Hi, She needs to be seen by cardiologist first, will need to reschedule Thanks ----- Message ----- From: Elsie Amis, RN Sent: 09/24/2022   2:41 PM EDT To: Matheus Spiker S Randen Kauth, CMA; Marlowe Shores, LPN; # Subject: AFib.                                          Good afternoon! We just got off the phone with [name]. She had some lung biopsies done 09/02/2022 and went into Afib during that procedure. She dose not have a cardiology appointment for this new onset until Sept 5. Do you still want to proceed with her procedure on 9/3 or wait until she has seen cardiology? Dr Alva Garnet will be the ologist next week.

## 2022-09-26 ENCOUNTER — Ambulatory Visit: Payer: HMO | Admitting: Radiation Oncology

## 2022-09-26 ENCOUNTER — Telehealth: Payer: Self-pay | Admitting: Sports Medicine

## 2022-09-26 ENCOUNTER — Other Ambulatory Visit: Payer: HMO

## 2022-09-26 NOTE — Telephone Encounter (Signed)
Patient's husband called. Says patient would like to talk to Dr. Shon Baton. Her cb is [phone]

## 2022-09-27 ENCOUNTER — Ambulatory Visit (HOSPITAL_COMMUNITY): Payer: HMO

## 2022-09-30 ENCOUNTER — Ambulatory Visit (HOSPITAL_COMMUNITY): Admission: RE | Admit: 2022-09-30 | Payer: HMO | Source: Home / Self Care | Admitting: Gastroenterology

## 2022-09-30 ENCOUNTER — Ambulatory Visit: Payer: HMO | Admitting: Physician Assistant

## 2022-09-30 ENCOUNTER — Other Ambulatory Visit: Payer: HMO

## 2022-09-30 ENCOUNTER — Encounter (HOSPITAL_COMMUNITY): Admission: RE | Payer: Self-pay | Source: Home / Self Care

## 2022-09-30 SURGERY — ESOPHAGOGASTRODUODENOSCOPY (EGD) WITH PROPOFOL
Anesthesia: Monitor Anesthesia Care

## 2022-10-01 DIAGNOSIS — C3491 Malignant neoplasm of unspecified part of right bronchus or lung: Secondary | ICD-10-CM | POA: Diagnosis not present

## 2022-10-01 DIAGNOSIS — M797 Fibromyalgia: Secondary | ICD-10-CM | POA: Diagnosis not present

## 2022-10-01 DIAGNOSIS — J449 Chronic obstructive pulmonary disease, unspecified: Secondary | ICD-10-CM | POA: Diagnosis not present

## 2022-10-01 DIAGNOSIS — C3490 Malignant neoplasm of unspecified part of unspecified bronchus or lung: Secondary | ICD-10-CM | POA: Diagnosis not present

## 2022-10-01 DIAGNOSIS — K219 Gastro-esophageal reflux disease without esophagitis: Secondary | ICD-10-CM | POA: Diagnosis not present

## 2022-10-01 DIAGNOSIS — I4811 Longstanding persistent atrial fibrillation: Secondary | ICD-10-CM | POA: Diagnosis not present

## 2022-10-02 ENCOUNTER — Ambulatory Visit: Payer: HMO | Attending: Internal Medicine

## 2022-10-02 ENCOUNTER — Encounter: Payer: Self-pay | Admitting: Internal Medicine

## 2022-10-02 ENCOUNTER — Other Ambulatory Visit: Payer: Self-pay | Admitting: Internal Medicine

## 2022-10-02 ENCOUNTER — Ambulatory Visit: Payer: HMO | Admitting: Internal Medicine

## 2022-10-02 ENCOUNTER — Telehealth: Payer: Self-pay | Admitting: Internal Medicine

## 2022-10-02 ENCOUNTER — Ambulatory Visit: Payer: HMO | Attending: Internal Medicine | Admitting: Internal Medicine

## 2022-10-02 ENCOUNTER — Ambulatory Visit: Payer: HMO | Admitting: Cardiology

## 2022-10-02 VITALS — BP 115/68 | HR 80 | Ht 60.0 in | Wt 173.2 lb

## 2022-10-02 DIAGNOSIS — I4891 Unspecified atrial fibrillation: Secondary | ICD-10-CM | POA: Diagnosis not present

## 2022-10-02 DIAGNOSIS — Z01810 Encounter for preprocedural cardiovascular examination: Secondary | ICD-10-CM | POA: Diagnosis not present

## 2022-10-02 DIAGNOSIS — I482 Chronic atrial fibrillation, unspecified: Secondary | ICD-10-CM

## 2022-10-02 DIAGNOSIS — I480 Paroxysmal atrial fibrillation: Secondary | ICD-10-CM | POA: Insufficient documentation

## 2022-10-02 MED ORDER — APIXABAN 5 MG PO TABS: 5.0000 mg | ORAL_TABLET | Freq: Two times a day (BID) | ORAL | 1 refills | Status: DC

## 2022-10-02 MED ORDER — METOPROLOL TARTRATE 25 MG PO TABS: 12.5000 mg | ORAL_TABLET | Freq: Two times a day (BID) | ORAL | 2 refills | Status: DC

## 2022-10-02 NOTE — Telephone Encounter (Signed)
Checking percert on the following   Placed and enrolled Zio XT monitor 2 week on 10/02/2022/[place]

## 2022-10-02 NOTE — Progress Notes (Signed)
Cardiology Office Note  Date: 10/02/2022   ID: CELESTER [name], DOB [birth year], MRN [id number]  PCP:  Ignatius Specking, MD  Cardiologist:  None Electrophysiologist:  None   History of Present Illness: Zoe [name] is a 87 y.o. female known to have paroxysmal A-fib, chronic diastolic heart failure is here for follow-up visit.  Per chart review, she had lung biopsies during when she went into A-fib but not in RVR. She has been on chronic digoxin therapy and not on any rate controlling medications like metoprolol or diltiazem. She is suffering from chronic constipation, severe and is worried if any of the new medications will exacerbate constipation.  She is here to undergo chemotherapy and radiation therapy for her cancer. Denies any palpitations, SOB, chest pain, orthopnea, PND, leg swelling.   Past Medical History:  Diagnosis Date   Abdominal discomfort 11/02/2012   suspected ?UTI- PCP MD placed on Cipro   Anxiety    Arthritis    Asthma    Cancer (HCC)    skin CA   Choking due to phlegm    Chronic constipation    Depression    Dysrhythmia    tachycardia   Environmental allergies    Fibromyalgia    GERD (gastroesophageal reflux disease)    Hemorrhoids    Hypercholesteremia    Hypertension    Sleep apnea     Past Surgical History:  Procedure Laterality Date   ANAL RECTAL MANOMETRY N/A 11/28/2013   Procedure: ANO RECTAL MANOMETRY;  Surgeon: Romie Levee, MD;  Location: [facility];  Service: Endoscopy;  Laterality: N/A;   APPENDECTOMY     BACK SURGERY     X2    BIOPSY  03/13/2021   Procedure: BIOPSY;  Surgeon: Malissa Hippo, MD;  Location: [facility];  Service: Endoscopy;;   BRONCHIAL BIOPSY  09/02/2022   Procedure: BRONCHIAL BIOPSIES;  Surgeon: Josephine Igo, DO;  Location: [facility];  Service: Pulmonary;;   BRONCHIAL BRUSHINGS  09/02/2022   Procedure: BRONCHIAL BRUSHINGS;  Surgeon: Josephine Igo, DO;  Location: [facility];  Service: Pulmonary;;    BRONCHIAL NEEDLE ASPIRATION BIOPSY  09/02/2022   Procedure: BRONCHIAL NEEDLE ASPIRATION BIOPSIES;  Surgeon: Josephine Igo, DO;  Location: [facility];  Service: Pulmonary;;   CHOLECYSTECTOMY     COLONOSCOPY  03/29/03   COLONOSCOPY N/A 12/09/2013   Procedure: COLONOSCOPY;  Surgeon: Malissa Hippo, MD;  [facility];  Service: Endoscopy;  Laterality: N/A;  730   COLONOSCOPY N/A 02/04/2017   Procedure: COLONOSCOPY;  Surgeon: Malissa Hippo, MD;  [facility];  Service: Endoscopy;  Laterality: N/A;  930   ENDOBRONCHIAL ULTRASOUND Bilateral 09/02/2022   Procedure: ENDOBRONCHIAL ULTRASOUND;  Surgeon: Josephine Igo, DO;  [facility]: [facility];  Service: Pulmonary;  Laterality: Bilateral;   ESOPHAGEAL DILATION N/A 03/13/2021   Procedure: ESOPHAGEAL DILATION;  Surgeon: Malissa Hippo, MD;  [facility];  Service: Endoscopy;  Laterality: N/A;   ESOPHAGOGASTRODUODENOSCOPY (EGD) WITH PROPOFOL N/A 03/13/2021   Procedure: ESOPHAGOGASTRODUODENOSCOPY (EGD) WITH PROPOFOL;  Surgeon: Malissa Hippo, MD;  [facility];  Service: Endoscopy;  Laterality: N/A;  8:05   EYE SURGERY     bilateral cataract removal   IR KYPHO LUMBAR INC FX REDUCE BONE BX UNI/BIL CANNULATION INC/IMAGING  11/24/2019   IR KYPHO THORACIC WITH BONE BIOPSY  08/23/2019   IR KYPHO THORACIC WITH BONE BIOPSY  11/24/2019   IR RADIOLOGIST EVAL & MGMT  08/03/2019  IR RADIOLOGIST EVAL & MGMT  09/29/2019   IR RADIOLOGIST EVAL & MGMT  10/18/2019   IR RADIOLOGIST EVAL & MGMT  04/11/2020   SKIN CANCER EXCISION     RIGHT NECK     TONSILLECTOMY     T+A   TOTAL HIP ARTHROPLASTY Right 09/14/2012   Procedure: RIGHT TOTAL HIP ARTHROPLASTY ANTERIOR APPROACH and RIGHT KNEE STEROID INJECTION;  Surgeon: Kathryne Hitch, MD;  Location: [facility];  Service: Orthopedics;  Laterality: Right;   TOTAL HIP ARTHROPLASTY Left 11/05/2012   Procedure: LEFT TOTAL HIP ARTHROPLASTY ANTERIOR APPROACH;  Surgeon: Kathryne Hitch, MD;  Location: [facility];  Service: Orthopedics;  Laterality: Left;   UMBILICAL HERNIA REPAIR  04/26/03   JENKINS   UPPER GASTROINTESTINAL ENDOSCOPY  08/03/2009   NUR   UPPER GASTROINTESTINAL ENDOSCOPY  03/29/2003   EGD ED TCS    Current Outpatient Medications  Medication Sig Dispense Refill   acetaminophen (TYLENOL) 500 MG tablet Take 500 mg by mouth. 3 -5 per day     albuterol (PROVENTIL HFA;VENTOLIN HFA) 108 (90 BASE) MCG/ACT inhaler Inhale 2 puffs into the lungs every 6 (six) hours as needed for wheezing or shortness of breath.     allopurinol (ZYLOPRIM) 300 MG tablet Take 1 tablet (300 mg total) by mouth daily. 90 tablet 1   ALPRAZolam (XANAX) 0.5 MG tablet Take 1 tablet (0.5 mg total) by mouth 2 (two) times daily. (Patient taking differently: Take 0.5 mg by mouth 2 (two) times daily as needed for anxiety.) 60 tablet 0   aspirin 81 MG tablet Take 1 tablet (81 mg total) by mouth daily. 30 tablet    Biotin 40981 MCG TABS Take 10,000 mcg by mouth daily.     calcium-vitamin D 250-100 MG-UNIT tablet Take 1 tablet by mouth daily at 6 (six) AM.     Cranberry-Vitamin C (CRANBERRY CONCENTRATE/VITAMINC PO) Take 250 mg by mouth daily.     cyanocobalamin 2000 MCG tablet Take 5,000 mcg by mouth daily.     diclofenac Sodium (VOLTAREN) 1 % GEL Apply 2-4 g topically 4 (four) times daily. APPLY 2-4 GRAMS TOPICALLY FOUR TIMES DAILY Strength: 1 % 200 g 3   digoxin (LANOXIN) 0.125 MG tablet Take 125 mcg by mouth daily.     doxepin (SINEQUAN) 100 MG capsule Take 1 capsule (100 mg total) by mouth at bedtime. 90 capsule 1   Fluticasone-Umeclidin-Vilant (TRELEGY ELLIPTA) 100-62.5-25 MCG/ACT AEPB Inhale 1 puff into the lungs daily. 60 each 11   gabapentin (NEURONTIN) 300 MG capsule Take 1 capsule (300 mg total) by mouth 3 (three) times daily. (Patient taking differently: Take 300 mg by mouth 2 (two) times daily.) 180 capsule 1   levocetirizine (XYZAL) 5 MG tablet Take 5 mg by mouth every evening.      lubiprostone (AMITIZA) 24 MCG capsule Take 1 capsule (24 mcg total) by mouth 2 (two) times daily with a meal. 180 capsule 3   meloxicam (MOBIC) 15 MG tablet Take 15 mg by mouth daily.     omeprazole (PRILOSEC) 40 MG capsule Take 1 capsule (40 mg total) by mouth daily. 90 capsule 3   OVER THE COUNTER MEDICATION Take 1 tablet by mouth daily. OTC "potassium 99 mg"     OVER THE COUNTER MEDICATION Take 1 tablet by mouth daily. " Magnesium 400 mg"     OVER THE COUNTER MEDICATION Take 1 Dose by mouth daily. " Apple cider vinegar"     pravastatin (PRAVACHOL) 20 MG tablet Take 20  mg by mouth daily.     prochlorperazine (COMPAZINE) 10 MG tablet Take 1 tablet (10 mg total) by mouth every 6 (six) hours as needed for nausea or vomiting. (Patient not taking: Reported on 09/18/2022) 30 tablet 1   Travoprost, BAK Free, (TRAVATAN) 0.004 % SOLN ophthalmic solution Place 1 drop into both eyes at bedtime.     No current facility-administered medications for this visit.   Allergies:  Codeine, Other, Oxycodone, Paxil [paroxetine hcl], and Procaine   Social History: The patient  reports that she quit smoking about 2 years ago. Her smoking use included cigarettes. She started smoking about 52 years ago. She has a 25 pack-year smoking history. She has never used smokeless tobacco. She reports current alcohol use. She reports that she does not use drugs.   Family History: The patient's family history includes Healthy in her daughter, son, and son.   ROS:  Please see the history of present illness. Otherwise, complete review of systems is positive for none  All other systems are reviewed and negative.   Physical Exam: VS:  BP 115/68   Pulse 80   Ht 5' (1.524 m)   Wt 173 lb 3.2 oz (78.6 kg)   SpO2 94%   BMI 33.83 kg/m , BMI Body mass index is 33.83 kg/m.  Wt Readings from Last 3 Encounters:  10/02/22 173 lb 3.2 oz (78.6 kg)  09/18/22 169 lb 3.2 oz (76.7 kg)  09/17/22 170 lb (77.1 kg)    General: Patient  appears comfortable at rest. HEENT: Conjunctiva and lids normal, oropharynx clear with moist mucosa. Neck: Supple, no elevated JVP or carotid bruits, no thyromegaly. Lungs: Clear to auscultation, nonlabored breathing at rest. Cardiac: Regular rate and rhythm, no S3 or significant systolic murmur, no pericardial rub. Abdomen: Soft, nontender, no hepatomegaly, bowel sounds present, no guarding or rebound. Extremities: No pitting edema, distal pulses 2+. Skin: Warm and dry. Musculoskeletal: No kyphosis. Neuropsychiatric: Alert and oriented x3, affect grossly appropriate.  Recent Labwork: 09/17/2022: ALT 9; AST 11; BUN 19; Creatinine 0.69; Hemoglobin 11.4; Platelet Count 178; Potassium 4.5; Sodium 142     Component Value Date/Time   CHOL 155 03/29/2020 1146   TRIG 150 (H) 03/29/2020 1146   HDL 49 03/29/2020 1146   CHOLHDL 3.2 03/29/2020 1146   LDLCALC 80 03/29/2020 1146    Other Studies Reviewed Today:   Assessment and Plan:  Paroxysmal A-fib: EKG from 8/24 showed atrial fibrillation, rate controlled and EKG today showed NSR.  Stop digoxin and start metoprolol tartrate 12.5 mg twice daily.  Start Eliquis 5 mg twice daily and stop aspirin 81 mg.  Obtain 2-week event monitor to quantify the burden of A-fib and assess rate control.  Refused sleep study as she does not want to wear CPAP if diagnosed with OSA.  Obtain echocardiogram. No prior history of CVA/TIA, okay to hold Eliquis for 2 to 3 days prior to any invasive procedure.  Preop cardiac risk stratification for EGD/colonoscopy: EKG today showed normal sinus rhythm and EKG from 8/24 showed atrial fibrillation, rate controlled.  She is compensated from heart failure standpoint.  Hence, she is at a low risk for any perioperative cardiac complications with a low risk procedure like EGD/colonoscopy.  Eliquis can be held for 2 to 3 days prior to the procedure.  No further cardiac testing is indicated prior to proceeding with the planned  procedure.  Chronic diastolic heart failure, compensated: No intervention.  HLD: Continue pravastatin 20 mg at bedtime, goal LDL less  than 100.       Medication Adjustments/Labs and Tests Ordered: Current medicines are reviewed at length with the patient today.  Concerns regarding medicines are outlined above.    Disposition:  Follow up  6 months  Signed Powell Halbert Verne Spurr, MD, 10/02/2022 1:16 PM    [facility] at [facility] [address] [place], [place], [place] [address]

## 2022-10-02 NOTE — Patient Instructions (Addendum)
Medication Instructions:  Your physician has recommended you make the following change in your medication:  Stop taking Digoxin  Stop taking Aspirin Start taking Metoprolol Tartrate 12.5 mg twice a day Start taking Eliquis 5 mg twice a day Continue taking all other medications as prescribed  Labwork: None  Testing/Procedures: Your physician has requested that you have an echocardiogram. Echocardiography is a painless test that uses sound waves to create images of your heart. It provides your doctor with information about the size and shape of your heart and how well your heart's chambers and valves are working. This procedure takes approximately one hour. There are no restrictions for this procedure. Please do NOT wear cologne, perfume, aftershave, or lotions (deodorant is allowed). Please arrive 15 minutes prior to your appointment time.  Your physician has recommended that you wear a Zio monitor.   This monitor is a medical device that records the heart's electrical activity. Doctors most often use these monitors to diagnose arrhythmias. Arrhythmias are problems with the speed or rhythm of the heartbeat. The monitor is a small device applied to your chest. You can wear one while you do your normal daily activities. While wearing this monitor if you have any symptoms to push the button and record what you felt. Once you have worn this monitor for the period of time provider prescribed (for 14 days), you will return the monitor device in the postage paid box. Once it is returned they will download the data collected and provide [place] with a report which the provider will then review and we will call you with those results. Important tips:  Avoid showering during the first 48 hours of wearing the monitor. Avoid excessive sweating to help maximize wear time. Do not submerge the device, no hot tubs, and no swimming pools. Keep any lotions or oils away from the patch. After 48 hours you may shower  with the patch on. Take brief showers with your back facing the shower head.  Do not remove patch once it has been placed because that will interrupt data and decrease adhesive wear time. Push the button when you have any symptoms and write down what you were feeling. Once you have completed wearing your monitor, remove and place into box which has postage paid and place in your outgoing mailbox.  If for some reason you have misplaced your box then call our office and we can provide another box and/or mail it off for you.   Follow-Up: Your physician recommends that you schedule a follow-up appointment in: 6 months  Any Other Special Instructions Will Be Listed Below (If Applicable).  If you need a refill on your cardiac medications before your next appointment, please call your pharmacy.

## 2022-10-03 ENCOUNTER — Other Ambulatory Visit: Payer: Self-pay

## 2022-10-06 ENCOUNTER — Ambulatory Visit: Payer: HMO

## 2022-10-06 ENCOUNTER — Other Ambulatory Visit: Payer: HMO

## 2022-10-06 NOTE — Telephone Encounter (Signed)
[facility] calling stating PT would like to make appt w/Dr. Tonia Brooms to discuss surgery to remove tumor rather than Chemo. Please call PT to advise. Her # is [phone].

## 2022-10-06 NOTE — Telephone Encounter (Signed)
Dr. Tonia Brooms- I know the Triage team said this PT needs to make an appointment to speak to you about doing surgery in place of Chemo but it shows on the chart as a malignancy and PT states she does not have much time to wait.   I have no appts with you until Oct 14th so I have to use my best judgment and get this note over to you directly. I can use a Nodule slot if you like or if you need [place] to work her in we can do that based on time and date in your reply, Thank you.

## 2022-10-08 ENCOUNTER — Telehealth: Payer: Self-pay | Admitting: Sports Medicine

## 2022-10-08 NOTE — Telephone Encounter (Signed)
Patient called. Returning a call to  schedule injections with Shon Baton.

## 2022-10-08 NOTE — Telephone Encounter (Signed)
I tried calling the pt and there was no answer- LMTCB   It looks like the referrals that Zoe [name] mentioned at LOV were placed  She is scheduled for PFT 10/09/22  Will have to complete PFT before she is seen by [facility]   Will await her call back

## 2022-10-09 ENCOUNTER — Ambulatory Visit (INDEPENDENT_AMBULATORY_CARE_PROVIDER_SITE_OTHER): Payer: HMO | Admitting: Internal Medicine

## 2022-10-09 DIAGNOSIS — C3411 Malignant neoplasm of upper lobe, right bronchus or lung: Secondary | ICD-10-CM

## 2022-10-09 LAB — PULMONARY FUNCTION TEST
DL/VA % pred: 93 %
DL/VA: 3.76 ml/min/mmHg/L
DLCO cor % pred: 92 %
DLCO cor: 17.51 ml/min/mmHg
DLCO unc % pred: 86 %
DLCO unc: 16.33 ml/min/mmHg
FEF 25-75 Post: 1.41 L/s
FEF 25-75 Pre: 1.35 L/s
FEF2575-%Change-Post: 4 %
FEF2575-%Pred-Post: 114 %
FEF2575-%Pred-Pre: 109 %
FEV1-%Change-Post: 0 %
FEV1-%Pred-Post: 101 %
FEV1-%Pred-Pre: 101 %
FEV1-Post: 1.88 L
FEV1-Pre: 1.87 L
FEV1FVC-%Change-Post: 2 %
FEV1FVC-%Pred-Pre: 101 %
FEV6-%Change-Post: 0 %
FEV6-%Pred-Post: 104 %
FEV6-%Pred-Pre: 104 %
FEV6-Post: 2.44 L
FEV6-Pre: 2.46 L
FEV6FVC-%Change-Post: 1 %
FEV6FVC-%Pred-Post: 105 %
FEV6FVC-%Pred-Pre: 103 %
FVC-%Change-Post: -1 %
FVC-%Pred-Post: 99 %
FVC-%Pred-Pre: 100 %
FVC-Post: 2.47 L
FVC-Pre: 2.52 L
Post FEV1/FVC ratio: 76 %
Post FEV6/FVC ratio: 99 %
Pre FEV1/FVC ratio: 74 %
Pre FEV6/FVC Ratio: 98 %
RV % pred: 121 %
RV: 3.03 L
TLC % pred: 106 %
TLC: 5.48 L

## 2022-10-09 NOTE — Patient Instructions (Signed)
Full PFT Performed Today  

## 2022-10-09 NOTE — Progress Notes (Signed)
Full PFT Performed Today  

## 2022-10-10 DIAGNOSIS — C3491 Malignant neoplasm of unspecified part of right bronchus or lung: Secondary | ICD-10-CM | POA: Diagnosis not present

## 2022-10-11 DIAGNOSIS — C3491 Malignant neoplasm of unspecified part of right bronchus or lung: Secondary | ICD-10-CM | POA: Diagnosis not present

## 2022-10-13 ENCOUNTER — Ambulatory Visit: Payer: HMO | Admitting: Physician Assistant

## 2022-10-13 ENCOUNTER — Other Ambulatory Visit: Payer: HMO

## 2022-10-13 ENCOUNTER — Ambulatory Visit: Payer: HMO

## 2022-10-13 DIAGNOSIS — C3491 Malignant neoplasm of unspecified part of right bronchus or lung: Secondary | ICD-10-CM | POA: Diagnosis not present

## 2022-10-14 ENCOUNTER — Telehealth: Payer: Self-pay | Admitting: Internal Medicine

## 2022-10-14 NOTE — Telephone Encounter (Signed)
Pt calling for PFT results

## 2022-10-15 NOTE — Telephone Encounter (Signed)
Patient has an appointment with Dr. Cliffton Asters at [facility] tomorrow at 11:00 am and she soul like to know if her PFT testing was ok.  She did not hear back from yesterday's message.

## 2022-10-16 ENCOUNTER — Encounter: Payer: HMO | Admitting: Thoracic Surgery (Cardiothoracic Vascular Surgery)

## 2022-10-16 ENCOUNTER — Encounter: Payer: Self-pay | Admitting: Thoracic Surgery (Cardiothoracic Vascular Surgery)

## 2022-10-16 ENCOUNTER — Ambulatory Visit: Payer: HMO | Attending: Internal Medicine

## 2022-10-16 ENCOUNTER — Institutional Professional Consult (permissible substitution) (INDEPENDENT_AMBULATORY_CARE_PROVIDER_SITE_OTHER): Payer: HMO | Admitting: Thoracic Surgery (Cardiothoracic Vascular Surgery)

## 2022-10-16 VITALS — BP 113/71 | HR 67 | Resp 20 | Ht 64.5 in | Wt 171.2 lb

## 2022-10-16 DIAGNOSIS — C3411 Malignant neoplasm of upper lobe, right bronchus or lung: Secondary | ICD-10-CM

## 2022-10-16 DIAGNOSIS — I4891 Unspecified atrial fibrillation: Secondary | ICD-10-CM

## 2022-10-16 NOTE — Telephone Encounter (Signed)
Pt wants to know the result her PFT

## 2022-10-16 NOTE — Progress Notes (Signed)
[address]       [place] [address]             [phone]                    [name] [place] Medical Record #[id number] Date of Birth: 09-08-1935  Referring: Bevelyn Ngo, NP Primary Care: Ignatius Specking, MD Primary Cardiologist: Marjo Bicker, MD  Chief Complaint:    Chief Complaint  Patient presents with   Lung Cancer    New patient consult, review all studies    History of Present Illness:    Zoe [name] 87 y.o. female presents for surgical evaluation of a 2.3cm right upper lobe lung cancer with evidence of N1 disease.  She quit smoking about 44yrs,ago.  She was enrolled in the lung cancer screening program.  She has a hx of multiple compression fractures, and spinal surgery, and currently walks with a cane.  She states that she is very mobile, and active.     Past Medical History:  Diagnosis Date   Abdominal discomfort 11/02/2012   suspected ?UTI- PCP MD placed on Cipro   Anxiety    Arthritis    Asthma    Cancer (HCC)    skin CA   Choking due to phlegm    Chronic constipation    Depression    Dysrhythmia    tachycardia   Environmental allergies    Fibromyalgia    GERD (gastroesophageal reflux disease)    Hemorrhoids    Hypercholesteremia    Hypertension    Sleep apnea     Past Surgical History:  Procedure Laterality Date   ANAL RECTAL MANOMETRY N/A 11/28/2013   Procedure: ANO RECTAL MANOMETRY;  Surgeon: Romie Levee, MD;  Location: [facility];  Service: Endoscopy;  Laterality: N/A;   APPENDECTOMY     BACK SURGERY     X2    BIOPSY  03/13/2021   Procedure: BIOPSY;  Surgeon: Malissa Hippo, MD;  Location: [facility];  Service: Endoscopy;;   BRONCHIAL BIOPSY  09/02/2022   Procedure: BRONCHIAL BIOPSIES;  Surgeon: Josephine Igo, DO;  Location: [facility];  Service: Pulmonary;;   BRONCHIAL BRUSHINGS  09/02/2022   Procedure: BRONCHIAL BRUSHINGS;  Surgeon: Josephine Igo, DO;  Location: [facility];   Service: Pulmonary;;   BRONCHIAL NEEDLE ASPIRATION BIOPSY  09/02/2022   Procedure: BRONCHIAL NEEDLE ASPIRATION BIOPSIES;  Surgeon: Josephine Igo, DO;  Location: [facility];  Service: Pulmonary;;   CHOLECYSTECTOMY     COLONOSCOPY  03/29/03   COLONOSCOPY N/A 12/09/2013   Procedure: COLONOSCOPY;  Surgeon: Malissa Hippo, MD;  [facility];  Service: Endoscopy;  Laterality: N/A;  730   COLONOSCOPY N/A 02/04/2017   Procedure: COLONOSCOPY;  Surgeon: Malissa Hippo, MD;  [facility];  Service: Endoscopy;  Laterality: N/A;  930   ENDOBRONCHIAL ULTRASOUND Bilateral 09/02/2022   Procedure: ENDOBRONCHIAL ULTRASOUND;  Surgeon: Josephine Igo, DO;  Location: [facility];  Service: Pulmonary;  Laterality: Bilateral;   ESOPHAGEAL DILATION N/A 03/13/2021   Procedure: ESOPHAGEAL DILATION;  Surgeon: Malissa Hippo, MD;  [facility];  Service: Endoscopy;  Laterality: N/A;   ESOPHAGOGASTRODUODENOSCOPY (EGD) WITH PROPOFOL N/A 03/13/2021   Procedure: ESOPHAGOGASTRODUODENOSCOPY (EGD) WITH PROPOFOL;  Surgeon: Malissa Hippo, MD;  [facility];  Service: Endoscopy;  Laterality: N/A;  8:05   EYE SURGERY     bilateral cataract  removal   IR KYPHO LUMBAR INC FX REDUCE BONE BX UNI/BIL CANNULATION INC/IMAGING  11/24/2019   IR KYPHO THORACIC WITH BONE BIOPSY  08/23/2019   IR KYPHO THORACIC WITH BONE BIOPSY  11/24/2019   IR RADIOLOGIST EVAL & MGMT  08/03/2019   IR RADIOLOGIST EVAL & MGMT  09/29/2019   IR RADIOLOGIST EVAL & MGMT  10/18/2019   IR RADIOLOGIST EVAL & MGMT  04/11/2020   SKIN CANCER EXCISION     RIGHT NECK     TONSILLECTOMY     T+A   TOTAL HIP ARTHROPLASTY Right 09/14/2012   Procedure: RIGHT TOTAL HIP ARTHROPLASTY ANTERIOR APPROACH and RIGHT KNEE STEROID INJECTION;  Surgeon: Kathryne Hitch, MD;  Location: [facility];  Service: Orthopedics;  Laterality: Right;   TOTAL HIP ARTHROPLASTY Left 11/05/2012   Procedure: LEFT TOTAL HIP ARTHROPLASTY ANTERIOR APPROACH;   Surgeon: Kathryne Hitch, MD;  Location: [facility];  Service: Orthopedics;  Laterality: Left;   UMBILICAL HERNIA REPAIR  04/26/03   JENKINS   UPPER GASTROINTESTINAL ENDOSCOPY  08/03/2009   NUR   UPPER GASTROINTESTINAL ENDOSCOPY  03/29/2003   EGD ED TCS    Family History  Problem Relation Age of Onset   Healthy Son    Healthy Son    Healthy Daughter    Colon cancer Neg Hx      Social History   Tobacco Use  Smoking Status Former   Current packs/day: 0.00   Average packs/day: 0.5 packs/day for 50.0 years (25.0 ttl pk-yrs)   Types: Cigarettes   Start date: 09/07/1970   Quit date: 09/06/2020   Years since quitting: 2.1  Smokeless Tobacco Never    Social History   Substance and Sexual Activity  Alcohol Use Yes   Alcohol/week: 0.0 standard drinks of alcohol   Comment: rare     Allergies  Allergen Reactions   Codeine Other (See Comments)    Pains in abdominal area   Other     NOVACAINE     ITCHING Anti-depressants   Oxycodone     Abdominal pain, chest pain   Paxil [Paroxetine Hcl] Itching   Procaine Itching    Current Outpatient Medications  Medication Sig Dispense Refill   acetaminophen (TYLENOL) 500 MG tablet Take 500 mg by mouth. 3 -5 per day     albuterol (PROVENTIL HFA;VENTOLIN HFA) 108 (90 BASE) MCG/ACT inhaler Inhale 2 puffs into the lungs every 6 (six) hours as needed for wheezing or shortness of breath.     allopurinol (ZYLOPRIM) 300 MG tablet Take 1 tablet (300 mg total) by mouth daily. 90 tablet 1   ALPRAZolam (XANAX) 0.5 MG tablet Take 0.5 mg by mouth at bedtime.     apixaban (ELIQUIS) 5 MG TABS tablet Take 1 tablet (5 mg total) by mouth 2 (two) times daily. 180 tablet 1   aspirin 81 MG tablet Take 81 mg by mouth 2 (two) times daily. 30 tablet    Biotin 30865 MCG TABS Take 10,000 mcg by mouth daily.     calcium-vitamin D 250-100 MG-UNIT tablet Take 1 tablet by mouth daily at 6 (six) AM.     Cranberry-Vitamin C (CRANBERRY CONCENTRATE/VITAMINC PO)  Take 250 mg by mouth daily.     cyanocobalamin 2000 MCG tablet Take 5,000 mcg by mouth daily.     diclofenac Sodium (VOLTAREN) 1 % GEL Apply 2-4 g topically 4 (four) times daily. APPLY 2-4 GRAMS TOPICALLY FOUR TIMES DAILY Strength: 1 % 200 g 3   doxepin (SINEQUAN) 100 MG  capsule Take 1 capsule (100 mg total) by mouth at bedtime. 90 capsule 1   Fluticasone-Umeclidin-Vilant (TRELEGY ELLIPTA) 100-62.5-25 MCG/ACT AEPB Inhale 1 puff into the lungs daily. 60 each 11   gabapentin (NEURONTIN) 300 MG capsule Take 300 mg by mouth 2 (two) times daily.     levocetirizine (XYZAL) 5 MG tablet Take 5 mg by mouth every evening.     lubiprostone (AMITIZA) 24 MCG capsule Take 1 capsule (24 mcg total) by mouth 2 (two) times daily with a meal. 180 capsule 3   meloxicam (MOBIC) 15 MG tablet Take 15 mg by mouth daily.     metoprolol tartrate (LOPRESSOR) 25 MG tablet Take 0.5 tablets (12.5 mg total) by mouth 2 (two) times daily. 135 tablet 2   omeprazole (PRILOSEC) 40 MG capsule Take 1 capsule (40 mg total) by mouth daily. 90 capsule 3   OVER THE COUNTER MEDICATION Take 1 tablet by mouth daily. OTC "potassium 99 mg"     OVER THE COUNTER MEDICATION Take 1 tablet by mouth daily. " Magnesium 400 mg"     OVER THE COUNTER MEDICATION Take 1 Dose by mouth daily. " Apple cider vinegar"     pravastatin (PRAVACHOL) 20 MG tablet Take 20 mg by mouth as directed. 3 x week     prochlorperazine (COMPAZINE) 10 MG tablet Take 1 tablet (10 mg total) by mouth every 6 (six) hours as needed for nausea or vomiting. 30 tablet 1   Travoprost, BAK Free, (TRAVATAN) 0.004 % SOLN ophthalmic solution Place 1 drop into both eyes at bedtime.     No current facility-administered medications for this visit.    Review of Systems  Constitutional:  Negative for malaise/fatigue.  Respiratory:  Negative for cough and shortness of breath.   Cardiovascular:  Negative for chest pain.  Musculoskeletal:  Positive for back pain, joint pain, myalgias and  neck pain.  Neurological: Negative.      PHYSICAL EXAMINATION: BP 113/71 (BP Location: Right Arm, Patient Position: Sitting, Cuff Size: Normal)   Pulse 67   Resp 20   Ht 5' 4.5" (1.638 m)   Wt 171 lb 3.2 oz (77.7 kg)   SpO2 92% Comment: RA  BMI 28.93 kg/m  Physical Exam Constitutional:      General: She is not in acute distress.    Appearance: She is not ill-appearing.  HENT:     Head: Normocephalic and atraumatic.  Eyes:     Extraocular Movements: Extraocular movements intact.  Cardiovascular:     Rate and Rhythm: Normal rate.  Pulmonary:     Effort: Pulmonary effort is normal. No respiratory distress.  Abdominal:     General: There is no distension.  Musculoskeletal:        General: Normal range of motion.     Cervical back: Normal range of motion.  Skin:    General: Skin is warm and dry.  Neurological:     General: No focal deficit present.     Mental Status: She is alert and oriented to person, place, and time.          I have independently reviewed the above radiology studies  and reviewed the findings with the patient.   Recent Lab Findings: Lab Results  Component Value Date   WBC 6.1 09/17/2022   HGB 11.4 (L) 09/17/2022   HCT 35.5 (L) 09/17/2022   PLT 178 09/17/2022   GLUCOSE 118 (H) 09/17/2022   CHOL 155 03/29/2020   TRIG 150 (H) 03/29/2020   HDL  49 03/29/2020   LDLCALC 80 03/29/2020   ALT 9 09/17/2022   AST 11 (L) 09/17/2022   NA 142 09/17/2022   K 4.5 09/17/2022   CL 108 09/17/2022   CREATININE 0.69 09/17/2022   BUN 19 09/17/2022   CO2 28 09/17/2022   INR 1.0 11/24/2019   HGBA1C 5.0 03/29/2020    Diagnostic Studies & Laboratory data:     Recent Radiology Findings:   MR Brain W Wo Contrast  Result Date: 09/20/2022 CLINICAL DATA:  Provided history: Malignant neoplasm of unspecified part of unspecified bronchus or lung. Non-small cell lung cancer, staging. EXAM: MRI HEAD WITHOUT AND WITH CONTRAST TECHNIQUE: Multiplanar, multiecho pulse  sequences of the brain and surrounding structures were obtained without and with intravenous contrast. CONTRAST:  9mL GADAVIST GADOBUTROL 1 MMOL/ML IV SOLN COMPARISON:  Brain MRI 09/22/2017. FINDINGS: Brain: No age advanced or lobar predominant parenchymal atrophy. Multifocal T2 FLAIR hyperintense signal abnormality within the cerebral white matter, nonspecific but compatible with moderate chronic small vessel ischemic disease. There is no acute infarct. No evidence of an intracranial mass. No chronic intracranial blood products. No extra-axial fluid collection. No midline shift. No pathologic intracranial enhancement identified. Vascular: Maintained flow voids within the proximal large arterial vessels. Skull and upper cervical spine: No focal suspicious marrow lesion. Slight C3-C4 grade 1 anterolisthesis. Sinuses/Orbits: No mass or acute finding within the imaged orbits. Prior bilateral ocular lens replacement. No significant paranasal sinus disease. Other: Small-volume fluid within the right mastoid air cells. IMPRESSION: 1. No evidence of intracranial metastatic disease. 2. Moderate chronic small vessel ischemic changes within the cerebral white matter, progressed from the prior brain MRI of 09/22/2017. 3. Small-volume fluid within the left mastoid air cells. Electronically Signed   By: Jackey Loge D.O.   On: 09/20/2022 20:41     PFTs:  - FVC: 100% - FEV1: 101% -DLCO: 92%     Assessment / Plan:   87yo female with right upper NCSLC, and positive N1 lymph node.  She also has a hx of atrial fibrillation, and multiple compression fractures.  We discussed several treatment options, and she is still leaning towards surgery.  She is scheduled to undergo an Echo today, and will meet with radiation oncology tomorrow.  If she ultimately decided to undergo surgery, she will need a stress test.     I  spent 60 minutes with  the patient face to face in counseling and coordination of care.    Corliss Skains 10/16/2022 11:56 AM

## 2022-10-16 NOTE — Telephone Encounter (Signed)
Called and spoke with patient informed he of her results , pt said that she has already received the results from another providers, I advised that we can't give her any information on her results until the ordering doctor has sign off on reports. Even though she can see her results on her my chart acct. Pt stated that she understood results.

## 2022-10-17 DIAGNOSIS — C3411 Malignant neoplasm of upper lobe, right bronchus or lung: Secondary | ICD-10-CM | POA: Diagnosis not present

## 2022-10-17 DIAGNOSIS — C771 Secondary and unspecified malignant neoplasm of intrathoracic lymph nodes: Secondary | ICD-10-CM | POA: Diagnosis not present

## 2022-10-17 DIAGNOSIS — C3491 Malignant neoplasm of unspecified part of right bronchus or lung: Secondary | ICD-10-CM | POA: Diagnosis not present

## 2022-10-17 LAB — ECHOCARDIOGRAM COMPLETE
AR max vel: 2.42 cm2
AV Area VTI: 2.28 cm2
AV Area mean vel: 2.16 cm2
AV Mean grad: 5 mmHg
AV Peak grad: 7.7 mmHg
Ao pk vel: 1.39 m/s
Area-P 1/2: 2.06 cm2
Calc EF: 54.9 %
Height: 64.5 in
MV VTI: 2.76 cm2
S' Lateral: 3.2 cm
Single Plane A2C EF: 54.7 %
Single Plane A4C EF: 55.2 %
Weight: 2739.2 oz

## 2022-10-20 ENCOUNTER — Other Ambulatory Visit: Payer: HMO

## 2022-10-20 ENCOUNTER — Other Ambulatory Visit: Payer: Self-pay | Admitting: Thoracic Surgery (Cardiothoracic Vascular Surgery)

## 2022-10-20 ENCOUNTER — Ambulatory Visit: Payer: HMO

## 2022-10-20 DIAGNOSIS — C3411 Malignant neoplasm of upper lobe, right bronchus or lung: Secondary | ICD-10-CM

## 2022-10-20 NOTE — Telephone Encounter (Signed)
LM for PT to call for PFT before appt.

## 2022-10-21 ENCOUNTER — Encounter: Payer: Self-pay | Admitting: *Deleted

## 2022-10-21 ENCOUNTER — Other Ambulatory Visit: Payer: Self-pay | Admitting: *Deleted

## 2022-10-21 ENCOUNTER — Telehealth (HOSPITAL_COMMUNITY): Payer: Self-pay | Admitting: *Deleted

## 2022-10-21 ENCOUNTER — Telehealth: Payer: Self-pay | Admitting: Internal Medicine

## 2022-10-21 DIAGNOSIS — C3411 Malignant neoplasm of upper lobe, right bronchus or lung: Secondary | ICD-10-CM

## 2022-10-21 DIAGNOSIS — I482 Chronic atrial fibrillation, unspecified: Secondary | ICD-10-CM | POA: Diagnosis not present

## 2022-10-21 NOTE — Telephone Encounter (Signed)
PT ret call she got about taking a Stress test. I adv we do not do Stress tests. Please call her to advise. I do not see any record of [place] calling her. [phone]

## 2022-10-21 NOTE — Telephone Encounter (Signed)
Pt reached and given instructions for MPI

## 2022-10-22 ENCOUNTER — Telehealth: Payer: Self-pay | Admitting: Internal Medicine

## 2022-10-22 ENCOUNTER — Ambulatory Visit (HOSPITAL_COMMUNITY): Payer: HMO | Attending: Thoracic Surgery (Cardiothoracic Vascular Surgery)

## 2022-10-22 DIAGNOSIS — K219 Gastro-esophageal reflux disease without esophagitis: Secondary | ICD-10-CM | POA: Diagnosis not present

## 2022-10-22 DIAGNOSIS — M797 Fibromyalgia: Secondary | ICD-10-CM | POA: Diagnosis not present

## 2022-10-22 DIAGNOSIS — J449 Chronic obstructive pulmonary disease, unspecified: Secondary | ICD-10-CM | POA: Diagnosis not present

## 2022-10-22 DIAGNOSIS — Z01810 Encounter for preprocedural cardiovascular examination: Secondary | ICD-10-CM | POA: Insufficient documentation

## 2022-10-22 DIAGNOSIS — C3411 Malignant neoplasm of upper lobe, right bronchus or lung: Secondary | ICD-10-CM | POA: Diagnosis not present

## 2022-10-22 DIAGNOSIS — C3490 Malignant neoplasm of unspecified part of unspecified bronchus or lung: Secondary | ICD-10-CM | POA: Diagnosis not present

## 2022-10-22 DIAGNOSIS — I4811 Longstanding persistent atrial fibrillation: Secondary | ICD-10-CM | POA: Diagnosis not present

## 2022-10-22 DIAGNOSIS — C3491 Malignant neoplasm of unspecified part of right bronchus or lung: Secondary | ICD-10-CM | POA: Diagnosis not present

## 2022-10-22 LAB — MYOCARDIAL PERFUSION IMAGING
LV dias vol: 77 mL (ref 46–106)
LV sys vol: 36 mL
Nuc Stress EF: 54 %
Peak HR: 71 {beats}/min
Rest HR: 60 {beats}/min
Rest Nuclear Isotope Dose: 10.4 mCi
SDS: -7
SRS: 8
SSS: 1
ST Depression (mm): 0 mm
Stress Nuclear Isotope Dose: 31.4 mCi
TID: 1.01

## 2022-10-22 MED ORDER — TECHNETIUM TC 99M TETROFOSMIN IV KIT: 31.4000 | PACK | Freq: Once | INTRAVENOUS | Status: AC | PRN

## 2022-10-22 MED ORDER — REGADENOSON 0.4 MG/5ML IV SOLN: 0.4000 mg | Freq: Once | INTRAVENOUS | Status: AC

## 2022-10-22 MED ORDER — TECHNETIUM TC 99M TETROFOSMIN IV KIT
10.4000 | PACK | Freq: Once | INTRAVENOUS | Status: AC | PRN
  Administered 2022-10-22: 10.4 via INTRAVENOUS

## 2022-10-22 NOTE — Telephone Encounter (Signed)
Pt states she already discussed her ECHO results with the doctor. Please advise

## 2022-10-23 ENCOUNTER — Other Ambulatory Visit: Payer: Self-pay

## 2022-10-23 ENCOUNTER — Ambulatory Visit: Payer: HMO | Admitting: Sports Medicine

## 2022-10-23 DIAGNOSIS — M25512 Pain in left shoulder: Secondary | ICD-10-CM

## 2022-10-23 DIAGNOSIS — M19011 Primary osteoarthritis, right shoulder: Secondary | ICD-10-CM | POA: Diagnosis not present

## 2022-10-23 DIAGNOSIS — G8929 Other chronic pain: Secondary | ICD-10-CM | POA: Diagnosis not present

## 2022-10-23 DIAGNOSIS — M25511 Pain in right shoulder: Secondary | ICD-10-CM | POA: Diagnosis not present

## 2022-10-23 DIAGNOSIS — M19012 Primary osteoarthritis, left shoulder: Secondary | ICD-10-CM

## 2022-10-23 MED ORDER — BUPIVACAINE HCL 0.25 % IJ SOLN
2.0000 mL | INTRAMUSCULAR | Status: AC | PRN
Start: 2022-10-23 — End: 2022-10-23
  Administered 2022-10-23: 2 mL via INTRA_ARTICULAR

## 2022-10-23 MED ORDER — LIDOCAINE HCL 1 % IJ SOLN
2.0000 mL | INTRAMUSCULAR | Status: AC | PRN
Start: 2022-10-23 — End: 2022-10-23
  Administered 2022-10-23: 2 mL

## 2022-10-23 MED ORDER — METHYLPREDNISOLONE ACETATE 40 MG/ML IJ SUSP
40.0000 mg | INTRAMUSCULAR | Status: AC | PRN
Start: 2022-10-23 — End: 2022-10-23
  Administered 2022-10-23: 40 mg via INTRA_ARTICULAR

## 2022-10-23 MED ORDER — METHYLPREDNISOLONE ACETATE 40 MG/ML IJ SUSP
80.0000 mg | INTRAMUSCULAR | Status: AC | PRN
Start: 2022-10-23 — End: 2022-10-23
  Administered 2022-10-23: 80 mg via INTRA_ARTICULAR

## 2022-10-23 NOTE — Progress Notes (Signed)
Zoe [name] - 87 y.o. female MRN [id number]  Date of birth: Jul 12, 1935  Office Visit Note: Visit Date: 10/23/2022 PCP: Ignatius Specking, MD Referred by: Ignatius Specking, MD  Subjective: Chief Complaint  Patient presents with   Right Shoulder - Pain, Follow-up   Left Shoulder - Pain, Follow-up   HPI: Zoe [name] is a pleasant 87 y.o. female who presents today for acute on chronic bilateral shoulder pain with known OA.  Known osteoarthritis of both shoulders.  Has received benefit from from prior corticosteroid injections.  The last was about 2 months ago, but her pain has been exacerbated and rather bothersome.  She unfortunately was diagnosed with lung cancer, is following up with Dr. Tonia Brooms and Dr. Sherlynn Stalls - awaiting lab results but considering lung resection as opposed to chemo/radiation.  For pain control she is using Tylenol and Gabapentin 300mg  BID.  Planned for robotic assisted thoracoscopy with right upper lobectomy with Dr. Sherlynn Stalls next month.  Pertinent ROS were reviewed with the patient and found to be negative unless otherwise specified above in HPI.   Assessment & Plan: Visit Diagnoses:  1. Primary osteoarthritis of both shoulders   2. Chronic right shoulder pain   3. Chronic left shoulder pain    Plan: Impression is exacerbation of chronic underlying osteoarthritis of bilateral shoulders.  She has undergone a lot of demanding testing given her recent lung cancer diagnosis.  She uses gabapentin 300 mg twice daily for pain control, for breakthrough pain does use Tylenol as well but this has only been somewhat controlling her pain.  It is slightly under 65-month mark, but given the degree of her pain and her upcoming procedures, this is a decision making we did repeat ultrasound-guided glenohumeral joint injections into both shoulders, she did tolerate this well.  Will use ice, Tylenol for any postinjection pain.  We have discussed safe activity modification and limiting  reaching motions to not exacerbate the shoulders.  She will follow-up in about 3 months as needed.  May continue her gabapentin 300 mg twice daily for pain control.  Follow-up: Return in about 3 months (around 01/22/2023), or if symptoms worsen or fail to improve.   Meds & Orders: No orders of the defined types were placed in this encounter.   Orders Placed This Encounter  Procedures   Large Joint Inj: R glenohumeral   Large Joint Inj: L glenohumeral   US Guided Needle Placement - No Linked Charges     Procedures: Large Joint Inj: R glenohumeral on 10/23/2022 2:09 PM Indications: pain Details: 22 G 3.5 in needle, ultrasound-guided posterior approach Medications: 2 mL lidocaine 1 %; 2 mL bupivacaine 0.25 %; 40 mg methylPREDNISolone acetate 40 MG/ML Outcome: tolerated well, no immediate complications  US-guided glenohumeral joint injection, right shoulder After discussion on risks/benefits/indications, informed verbal consent was obtained. A timeout was then performed. The patient was positioned lying lateral recumbent on examination table. The patient's shoulder was prepped with betadine and multiple alcohol swabs and utilizing ultrasound guidance, the patient's glenohumeral joint was identified on ultrasound. Using ultrasound guidance a 22-gauge, 3.5 inch needle with a mixture of 2:2:1 cc's lidocaine:bupivicaine:depomedrol was directed from a lateral to medial direction via in-plane technique into the glenohumeral joint with visualization of appropriate spread of injectate into the joint. Patient tolerated the procedure well without immediate complications.      Procedure, treatment alternatives, risks and benefits explained, specific risks discussed. Consent was given by the patient. Immediately prior to procedure a time  out was called to verify the correct patient, procedure, equipment, support staff and site/side marked as required. Patient was prepped and draped in the usual sterile  fashion.    Large Joint Inj: L glenohumeral on 10/23/2022 2:09 PM Indications: pain Details: 22 G 3.5 in needle, ultrasound-guided posterior approach Medications: 2 mL lidocaine 1 %; 2 mL bupivacaine 0.25 %; 80 mg methylPREDNISolone acetate 40 MG/ML Outcome: tolerated well, no immediate complications  US-guided glenohumeral joint injection, left shoulder After discussion on risks/benefits/indications, informed verbal consent was obtained. A timeout was then performed. The patient was positioned lying lateral recumbent on examination table. The patient's shoulder was prepped with betadine and multiple alcohol swabs and utilizing ultrasound guidance, the patient's glenohumeral joint was identified on ultrasound. Using ultrasound guidance a 22-gauge, 3.5 inch needle with a mixture of 2:2:1 cc's lidocaine:bupivicaine:depomedrol was directed from a lateral to medial direction via in-plane technique into the glenohumeral joint with visualization of appropriate spread of injectate into the joint. Patient tolerated the procedure well without immediate complications.      Procedure, treatment alternatives, risks and benefits explained, specific risks discussed. Consent was given by the patient. Immediately prior to procedure a time out was called to verify the correct patient, procedure, equipment, support staff and site/side marked as required. Patient was prepped and draped in the usual sterile fashion.          Clinical History: No specialty comments available.  She reports that she quit smoking about 2 years ago. Her smoking use included cigarettes. She started smoking about 52 years ago. She has a 25 pack-year smoking history. She has never used smokeless tobacco. No results for input(s): "HGBA1C", "LABURIC" in the last 8760 hours.  Objective:    Physical Exam  Gen: Well-appearing, in no acute distress; non-toxic CV: Well-perfused. Warm.  Resp: Breathing unlabored on room air; no  wheezing. Psych: Fluid speech in conversation; appropriate affect; normal thought process Neuro: Sensation intact throughout. No gross coordination deficits.   Ortho Exam - Bilateral shoulders: No AC joint TTP, there is a small effusion on the left shoulder and trace effusion on the right shoulder without significant warmth or redness.  There is full active range of motion but pain with endrange external rotation and abduction.   Imaging:  *Independent review of right and left shoulder x-ray from 03/05/2022 shows moderate to severe glenohumeral joint arthritic changes of both shoulders.  Past Medical/Family/Surgical/Social History: Medications & Allergies reviewed per EMR, new medications updated. Patient Active Problem List   Diagnosis Date Noted   Paroxysmal atrial fibrillation (HCC) 10/02/2022   Preop cardiovascular exam 10/02/2022   Hoarseness 09/18/2022   Globus sensation 09/18/2022   Malignant neoplasm of upper lobe of right lung (HCC) 09/15/2022   Lung nodule 08/25/2022   Solitary pulmonary nodule 06/22/2022   Aspiration into airway 11/28/2021   IBS (irritable bowel syndrome) 11/28/2021   SSBE (short-segment Barrett's esophagus) 06/25/2021   Lip pain 11/01/2020   Claudication (HCC) 08/02/2020   Hearing loss, right 07/16/2020   Seasonal allergies 07/04/2020   Insomnia 07/04/2020   Osteoporosis 07/04/2020   Lumbosacral spondylosis without myelopathy 06/01/2020   Polyarthritis 04/03/2020   Nausea 04/03/2020   Leg swelling 01/31/2020   Chronic diastolic CHF (congestive heart failure) (HCC) 01/31/2020   Abdominal pain 01/10/2020   Dysphagia 06/21/2019   Stenosis of lateral recess of lumbar spine 12/06/2018   Foraminal stenosis of lumbar region 12/06/2018   Spondylosis without myelopathy or radiculopathy, lumbar region 12/06/2018   Other idiopathic scoliosis, thoracolumbar  region 12/06/2018   Chronic left shoulder pain 12/06/2018   History of colonic polyps 10/22/2016   SBO  (small bowel obstruction) (HCC) 03/02/2013   Hyperlipidemia 12/08/2012   Gout 12/08/2012   Dysrhythmia 12/08/2012   Depression 12/08/2012   Asthmatic bronchitis 12/08/2012   Fibromyalgia 12/08/2012   Constipation 12/08/2012   Acute posthemorrhagic anemia 12/07/2012   Benign essential hypertension 12/07/2012   Degenerative arthritis of hip, left 11/05/2012   Weakness of right leg 03/24/2011   OA (osteoarthritis) 03/24/2011   Stiffness of cervical spine 03/24/2011   GERD (gastroesophageal reflux disease) 11/19/2010   Spinal stenosis 04/18/2009   HIP PAIN 11/17/2007   DDD (degenerative disc disease), lumbosacral 11/17/2007   ANSERINE BURSITIS, RIGHT 08/11/2007   Sciatica 06/22/2007   Knee pain 03/17/2007   Past Medical History:  Diagnosis Date   Abdominal discomfort 11/02/2012   suspected ?UTI- PCP MD placed on Cipro   Anxiety    Arthritis    Asthma    Cancer (HCC)    skin CA   Choking due to phlegm    Chronic constipation    Depression    Dysrhythmia    tachycardia   Environmental allergies    Fibromyalgia    GERD (gastroesophageal reflux disease)    Hemorrhoids    Hypercholesteremia    Hypertension    Sleep apnea    Family History  Problem Relation Age of Onset   Healthy Son    Healthy Son    Healthy Daughter    Colon cancer Neg Hx    Past Surgical History:  Procedure Laterality Date   ANAL RECTAL MANOMETRY N/A 11/28/2013   Procedure: ANO RECTAL MANOMETRY;  Surgeon: Romie Levee, MD;  Location: [facility];  Service: Endoscopy;  Laterality: N/A;   APPENDECTOMY     BACK SURGERY     X2    BIOPSY  03/13/2021   Procedure: BIOPSY;  Surgeon: Malissa Hippo, MD;  Location: [facility];  Service: Endoscopy;;   BRONCHIAL BIOPSY  09/02/2022   Procedure: BRONCHIAL BIOPSIES;  Surgeon: Josephine Igo, DO;  Location: [facility];  Service: Pulmonary;;   BRONCHIAL BRUSHINGS  09/02/2022   Procedure: BRONCHIAL BRUSHINGS;  Surgeon: Josephine Igo, DO;  Location: [facility];  Service: Pulmonary;;   BRONCHIAL NEEDLE ASPIRATION BIOPSY  09/02/2022   Procedure: BRONCHIAL NEEDLE ASPIRATION BIOPSIES;  Surgeon: Josephine Igo, DO;  Location: [facility];  Service: Pulmonary;;   CHOLECYSTECTOMY     COLONOSCOPY  03/29/03   COLONOSCOPY N/A 12/09/2013   Procedure: COLONOSCOPY;  Surgeon: Malissa Hippo, MD;  [facility];  Service: Endoscopy;  Laterality: N/A;  730   COLONOSCOPY N/A 02/04/2017   Procedure: COLONOSCOPY;  Surgeon: Malissa Hippo, MD;  [facility];  Service: Endoscopy;  Laterality: N/A;  930   ENDOBRONCHIAL ULTRASOUND Bilateral 09/02/2022   Procedure: ENDOBRONCHIAL ULTRASOUND;  Surgeon: Josephine Igo, DO;  Location: [facility];  Service: Pulmonary;  Laterality: Bilateral;   ESOPHAGEAL DILATION N/A 03/13/2021   Procedure: ESOPHAGEAL DILATION;  Surgeon: Malissa Hippo, MD;  [facility];  Service: Endoscopy;  Laterality: N/A;   ESOPHAGOGASTRODUODENOSCOPY (EGD) WITH PROPOFOL N/A 03/13/2021   Procedure: ESOPHAGOGASTRODUODENOSCOPY (EGD) WITH PROPOFOL;  Surgeon: Malissa Hippo, MD;  [facility];  Service: Endoscopy;  Laterality: N/A;  8:05   EYE SURGERY     bilateral cataract removal   IR KYPHO LUMBAR INC FX REDUCE BONE BX UNI/BIL CANNULATION INC/IMAGING  11/24/2019   IR  KYPHO THORACIC WITH BONE BIOPSY  08/23/2019   IR KYPHO THORACIC WITH BONE BIOPSY  11/24/2019   IR RADIOLOGIST EVAL & MGMT  08/03/2019   IR RADIOLOGIST EVAL & MGMT  09/29/2019   IR RADIOLOGIST EVAL & MGMT  10/18/2019   IR RADIOLOGIST EVAL & MGMT  04/11/2020   SKIN CANCER EXCISION     RIGHT NECK     TONSILLECTOMY     T+A   TOTAL HIP ARTHROPLASTY Right 09/14/2012   Procedure: RIGHT TOTAL HIP ARTHROPLASTY ANTERIOR APPROACH and RIGHT KNEE STEROID INJECTION;  Surgeon: Kathryne Hitch, MD;  Location: [facility];  Service: Orthopedics;  Laterality: Right;   TOTAL HIP ARTHROPLASTY Left 11/05/2012   Procedure: LEFT TOTAL HIP ARTHROPLASTY ANTERIOR  APPROACH;  Surgeon: Kathryne Hitch, MD;  Location: [facility];  Service: Orthopedics;  Laterality: Left;   UMBILICAL HERNIA REPAIR  04/26/03   JENKINS   UPPER GASTROINTESTINAL ENDOSCOPY  08/03/2009   NUR   UPPER GASTROINTESTINAL ENDOSCOPY  03/29/2003   EGD ED TCS   Social History   Occupational History   Not on file  Tobacco Use   Smoking status: Former    Current packs/day: 0.00    Average packs/day: 0.5 packs/day for 50.0 years (25.0 ttl pk-yrs)    Types: Cigarettes    Start date: 09/07/1970    Quit date: 09/06/2020    Years since quitting: 2.1   Smokeless tobacco: Never  Vaping Use   Vaping status: Never Used  Substance and Sexual Activity   Alcohol use: Yes    Alcohol/week: 0.0 standard drinks of alcohol    Comment: rare   Drug use: No   Sexual activity: Not Currently    Birth control/protection: Post-menopausal

## 2022-10-23 NOTE — Telephone Encounter (Signed)
Pt has already completed the PFT- it was done on 10/09/22 and her appt with Cliffton Asters is set.

## 2022-10-23 NOTE — Telephone Encounter (Signed)
Patient verbalized understanding of results and copy sent to PCP

## 2022-10-24 NOTE — Telephone Encounter (Signed)
Patient states she has already received her results. Nothing further needed.

## 2022-10-27 ENCOUNTER — Other Ambulatory Visit: Payer: HMO

## 2022-10-27 ENCOUNTER — Ambulatory Visit: Payer: HMO | Admitting: Internal Medicine

## 2022-10-27 ENCOUNTER — Ambulatory Visit: Payer: HMO

## 2022-10-30 DIAGNOSIS — Z299 Encounter for prophylactic measures, unspecified: Secondary | ICD-10-CM | POA: Diagnosis not present

## 2022-10-30 DIAGNOSIS — Z23 Encounter for immunization: Secondary | ICD-10-CM | POA: Diagnosis not present

## 2022-10-30 DIAGNOSIS — G4700 Insomnia, unspecified: Secondary | ICD-10-CM | POA: Diagnosis not present

## 2022-10-30 DIAGNOSIS — C3411 Malignant neoplasm of upper lobe, right bronchus or lung: Secondary | ICD-10-CM | POA: Diagnosis not present

## 2022-10-30 DIAGNOSIS — M545 Low back pain, unspecified: Secondary | ICD-10-CM | POA: Diagnosis not present

## 2022-10-30 DIAGNOSIS — I10 Essential (primary) hypertension: Secondary | ICD-10-CM | POA: Diagnosis not present

## 2022-11-01 DIAGNOSIS — C3411 Malignant neoplasm of upper lobe, right bronchus or lung: Secondary | ICD-10-CM | POA: Diagnosis not present

## 2022-11-03 ENCOUNTER — Ambulatory Visit: Payer: HMO

## 2022-11-03 ENCOUNTER — Other Ambulatory Visit: Payer: HMO

## 2022-11-06 ENCOUNTER — Other Ambulatory Visit (HOSPITAL_COMMUNITY): Payer: HMO

## 2022-11-10 ENCOUNTER — Ambulatory Visit: Payer: HMO

## 2022-11-10 ENCOUNTER — Other Ambulatory Visit: Payer: HMO

## 2022-11-10 ENCOUNTER — Ambulatory Visit: Payer: HMO | Admitting: Internal Medicine

## 2022-11-12 ENCOUNTER — Encounter: Payer: Self-pay | Admitting: Internal Medicine

## 2022-11-12 NOTE — Pre-Procedure Instructions (Signed)
Surgical Instructions   Your procedure is scheduled on November 17, 2022. Report to [facility] Main Entrance "A" at 7:00 A.M., then check in with the Admitting office. Any questions or running late day of surgery: call [phone]  Questions prior to your surgery date: call [phone], Monday-Friday, 8am-4pm. If you experience any cold or flu symptoms such as cough, fever, chills, shortness of breath, etc. between now and your scheduled surgery, please notify us at the above number.     Remember:  Do not eat or drink after midnight the night before your surgery    Take these medicines the morning of surgery with A SIP OF WATER: allopurinol (ZYLOPRIM)  docusate sodium (COLACE)  dorzolamide (TRUSOPT) ophthalmic solution  Fluticasone-Umeclidin-Vilant (TRELEGY ELLIPTA) inhaler omeprazole (PRILOSEC)  pravastatin (PRAVACHOL)  prochlorperazine (COMPAZINE)    May take these medicines IF NEEDED: acetaminophen (TYLENOL)  albuterol (PROVENTIL HFA;VENTOLIN HFA) inhaler  ALPRAZolam (XANAX)  gabapentin (NEURONTIN)    STOP taking your apixaban (ELIQUIS) and meloxicam (MOBIC) five days prior to surgery. Your last dose will be October 15th.   One week prior to surgery, STOP taking any Aspirin (unless otherwise instructed by your surgeon) Aleve, Naproxen, Ibuprofen, Motrin, Advil, Goody's, BC's, all herbal medications, fish oil, and non-prescription vitamins. This includes your medication: diclofenac Sodium (VOLTAREN) GEL                      Do NOT Smoke (Tobacco/Vaping) for 24 hours prior to your procedure.  If you use a CPAP at night, you may bring your mask/headgear for your overnight stay.   You will be asked to remove any contacts, glasses, piercing's, hearing aid's, dentures/partials prior to surgery. Please bring cases for these items if needed.    Patients discharged the day of surgery will not be allowed to drive home, and someone needs to stay with them for 24  hours.  SURGICAL WAITING ROOM VISITATION Patients may have no more than 2 support people in the waiting area - these visitors may rotate.   Pre-op nurse will coordinate an appropriate time for 1 ADULT support person, who may not rotate, to accompany patient in pre-op.  Children under the age of 69 must have an adult with them who is not the patient and must remain in the main waiting area with an adult.  If the patient needs to stay at the hospital during part of their recovery, the visitor guidelines for inpatient rooms apply.  Please refer to the [facility] website for the visitor guidelines for any additional information.   If you received a COVID test during your pre-op visit  it is requested that you wear a mask when out in public, stay away from anyone that may not be feeling well and notify your surgeon if you develop symptoms. If you have been in contact with anyone that has tested positive in the last 10 days please notify you surgeon.      Pre-operative CHG Bathing Instructions   You can play a key role in reducing the risk of infection after surgery. Your skin needs to be as free of germs as possible. You can reduce the number of germs on your skin by washing with CHG (chlorhexidine gluconate) soap before surgery. CHG is an antiseptic soap that kills germs and continues to kill germs even after washing.   DO NOT use if you have an allergy to chlorhexidine/CHG or antibacterial soaps. If your skin becomes reddened or irritated, stop using the CHG and  notify one of our RNs at [phone].              TAKE A SHOWER THE NIGHT BEFORE SURGERY AND THE DAY OF SURGERY    Please keep in mind the following:  DO NOT shave, including legs and underarms, 48 hours prior to surgery.   You may shave your face before/day of surgery.  Place clean sheets on your bed the night before surgery Use a clean washcloth (not used since being washed) for each shower. DO NOT sleep with pet's night  before surgery.  CHG Shower Instructions:  Wash your face and private area with normal soap. If you choose to wash your hair, wash first with your normal shampoo.  After you use shampoo/soap, rinse your hair and body thoroughly to remove shampoo/soap residue.  Turn the water OFF and apply half the bottle of CHG soap to a CLEAN washcloth.  Apply CHG soap ONLY FROM YOUR NECK DOWN TO YOUR TOES (washing for 3-5 minutes)  DO NOT use CHG soap on face, private areas, open wounds, or sores.  Pay special attention to the area where your surgery is being performed.  If you are having back surgery, having someone wash your back for you may be helpful. Wait 2 minutes after CHG soap is applied, then you may rinse off the CHG soap.  Pat dry with a clean towel  Put on clean pajamas    Additional instructions for the day of surgery: DO NOT APPLY any lotions, deodorants, cologne, or perfumes.   Do not wear jewelry or makeup Do not wear nail polish, gel polish, artificial nails, or any other type of covering on natural nails (fingers and toes) Do not bring valuables to the hospital. [facility] is not responsible for valuables/personal belongings. Put on clean/comfortable clothes.  Please brush your teeth.  Ask your nurse before applying any prescription medications to the skin.

## 2022-11-13 ENCOUNTER — Other Ambulatory Visit: Payer: Self-pay

## 2022-11-13 ENCOUNTER — Encounter (HOSPITAL_COMMUNITY): Payer: Self-pay

## 2022-11-13 ENCOUNTER — Encounter (HOSPITAL_COMMUNITY)
Admission: RE | Admit: 2022-11-13 | Discharge: 2022-11-13 | Disposition: A | Discharge status: Home / Self Care | Payer: HMO | Source: Ambulatory Visit | Attending: Thoracic Surgery (Cardiothoracic Vascular Surgery) | Admitting: Thoracic Surgery (Cardiothoracic Vascular Surgery)

## 2022-11-13 ENCOUNTER — Ambulatory Visit (HOSPITAL_COMMUNITY)
Admission: RE | Admit: 2022-11-13 | Discharge: 2022-11-13 | Disposition: A | Discharge status: Home / Self Care | Payer: HMO | Source: Ambulatory Visit | Attending: Thoracic Surgery (Cardiothoracic Vascular Surgery) | Admitting: Thoracic Surgery (Cardiothoracic Vascular Surgery)

## 2022-11-13 VITALS — BP 116/69 | HR 65 | Temp 97.7°F | Resp 18 | Ht 64.0 in | Wt 170.9 lb

## 2022-11-13 DIAGNOSIS — Z01818 Encounter for other preprocedural examination: Secondary | ICD-10-CM | POA: Insufficient documentation

## 2022-11-13 DIAGNOSIS — I517 Cardiomegaly: Secondary | ICD-10-CM | POA: Insufficient documentation

## 2022-11-13 DIAGNOSIS — C3411 Malignant neoplasm of upper lobe, right bronchus or lung: Secondary | ICD-10-CM | POA: Diagnosis not present

## 2022-11-13 DIAGNOSIS — C3410 Malignant neoplasm of upper lobe, unspecified bronchus or lung: Secondary | ICD-10-CM | POA: Diagnosis not present

## 2022-11-13 DIAGNOSIS — Z1152 Encounter for screening for COVID-19: Secondary | ICD-10-CM | POA: Diagnosis not present

## 2022-11-13 DIAGNOSIS — I480 Paroxysmal atrial fibrillation: Secondary | ICD-10-CM | POA: Diagnosis not present

## 2022-11-13 HISTORY — DX: Unspecified atrial fibrillation: I48.91

## 2022-11-13 HISTORY — DX: Diaphragmatic hernia without obstruction or gangrene: K44.9

## 2022-11-13 LAB — CBC
HCT: 37.3 % (ref 36.0–46.0)
Hemoglobin: 11.8 g/dL — ABNORMAL LOW (ref 12.0–15.0)
MCH: 30.9 pg (ref 26.0–34.0)
MCHC: 31.6 g/dL (ref 30.0–36.0)
MCV: 97.6 fL (ref 80.0–100.0)
Platelets: 226 10*3/uL (ref 150–400)
RBC: 3.82 MIL/uL — ABNORMAL LOW (ref 3.87–5.11)
RDW: 14.1 % (ref 11.5–15.5)
WBC: 7.8 10*3/uL (ref 4.0–10.5)
nRBC: 0 % (ref 0.0–0.2)

## 2022-11-13 LAB — COMPREHENSIVE METABOLIC PANEL
ALT: 12 U/L (ref 0–44)
AST: 19 U/L (ref 15–41)
Albumin: 3.7 g/dL (ref 3.5–5.0)
Alkaline Phosphatase: 88 U/L (ref 38–126)
Anion gap: 8 (ref 5–15)
BUN: 16 mg/dL (ref 8–23)
CO2: 23 mmol/L (ref 22–32)
Calcium: 9.6 mg/dL (ref 8.9–10.3)
Chloride: 107 mmol/L (ref 98–111)
Creatinine, Ser: 0.75 mg/dL (ref 0.44–1.00)
GFR, Estimated: >60 mL/min (ref >60)
Glucose, Bld: 108 mg/dL — ABNORMAL HIGH (ref 70–99)
Potassium: 4.5 mmol/L (ref 3.5–5.1)
Sodium: 138 mmol/L (ref 135–145)
Total Bilirubin: 0.4 mg/dL (ref 0.3–1.2)
Total Protein: 7.1 g/dL (ref 6.5–8.1)

## 2022-11-13 LAB — URINALYSIS, ROUTINE W REFLEX MICROSCOPIC
Bilirubin Urine: NEGATIVE
Glucose, UA: NEGATIVE mg/dL
Hgb urine dipstick: NEGATIVE
Ketones, ur: NEGATIVE mg/dL
Nitrite: NEGATIVE
Protein, ur: NEGATIVE mg/dL
Specific Gravity, Urine: 1.016 (ref 1.005–1.030)
pH: 5 (ref 5.0–8.0)

## 2022-11-13 LAB — TYPE AND SCREEN
ABO/RH(D): O NEG
Antibody Screen: NEGATIVE

## 2022-11-13 LAB — SURGICAL PCR SCREEN
MRSA, PCR: POSITIVE — AB
Staphylococcus aureus: POSITIVE — AB

## 2022-11-13 LAB — APTT: aPTT: 29 s (ref 24–36)

## 2022-11-13 LAB — PROTIME-INR
INR: 1.1 (ref 0.8–1.2)
Prothrombin Time: 14.6 s (ref 11.4–15.2)

## 2022-11-13 NOTE — Progress Notes (Signed)
PCP - Dr. Doreen Beam Cardiologist - Dr. Luane School  PPM/ICD - denies   Chest x-ray - 11/13/22 EKG - 11/13/22 Stress Test - 10/22/22 ECHO - 10/16/22 Cardiac Cath - denies  Sleep Study - OSA+ CPAP - denies  DM- denies  Blood Thinner Instructions: Hold Eliquis 5 days prior to surgery. Pt states she took her last dose on 10/16. She knows not to take it anymore prior to surgery. Dr. Cliffton Asters is aware.  Aspirin Instructions: n/a  ERAS Protcol - no, NPO   COVID TEST- 11/13/22   Anesthesia review: yes, cardiac hx  Patient denies shortness of breath, fever, cough and chest pain at PAT appointment   All instructions explained to the patient, with a verbal understanding of the material. Patient agrees to go over the instructions while at home for a better understanding. Patient also instructed to wear a mask in public after being tested for COVID-19. The opportunity to ask questions was provided.

## 2022-11-14 LAB — SARS CORONAVIRUS 2 (TAT 6-24 HRS): SARS Coronavirus 2: NEGATIVE

## 2022-11-14 NOTE — Plan of Care (Signed)
CHL Tonsillectomy/Adenoidectomy, Postoperative PEDS care plan entered in error.

## 2022-11-14 NOTE — Progress Notes (Signed)
Darius Bump, RN, notified of positive PCR results

## 2022-11-16 NOTE — Plan of Care (Signed)
CHL Tonsillectomy/Adenoidectomy, Postoperative PEDS care plan entered in error.

## 2022-11-16 NOTE — Anesthesia Preprocedure Evaluation (Signed)
Anesthesia Evaluation  Patient identified by MRN, date of birth, ID band Patient awake    Reviewed: Allergy & Precautions, H&P , NPO status , Patient's Chart, lab work & pertinent test results  Airway Mallampati: IV   Neck ROM: Full  Mouth opening: Limited Mouth Opening  Dental no notable dental hx. (+) Teeth Intact, Dental Advisory Given   Pulmonary asthma , sleep apnea , COPD,  COPD inhaler, former smoker RUL cancer   Pulmonary exam normal breath sounds clear to auscultation       Cardiovascular hypertension, Pt. on medications and Pt. on home beta blockers + dysrhythmias Atrial Fibrillation  Rhythm:Regular Rate:Normal  09/2022 Myoview: no ischemia, EF 54%, low risk  09/2022 ECHO: EF 55-60%, normal LVF, mild LVH, Grade 1 DD, normal RVF, no significant valvular abnormalities   Neuro/Psych   Anxiety Depression    negative neurological ROS     GI/Hepatic Neg liver ROS, hiatal hernia,GERD  ,,  Endo/Other  negative endocrine ROS    Renal/GU negative Renal ROS  negative genitourinary   Musculoskeletal  (+) Arthritis ,  Fibromyalgia -  Abdominal   Peds  Hematology  (+) Blood dyscrasia, anemia eliquis   Anesthesia Other Findings   Reproductive/Obstetrics negative OB ROS                             Anesthesia Physical Anesthesia Plan  ASA: 3  Anesthesia Plan: General   Post-op Pain Management: Tylenol PO (pre-op)*   Induction: Intravenous  PONV Risk Score and Plan: 3 and Ondansetron, Dexamethasone and Treatment may vary due to age or medical condition  Airway Management Planned: Oral ETT and Double Lumen EBT  Additional Equipment: CVP and ClearSight  Intra-op Plan:   Post-operative Plan: Extubation in OR  Informed Consent: I have reviewed the patients History and Physical, chart, labs and discussed the procedure including the risks, benefits and alternatives for the proposed  anesthesia with the patient or authorized representative who has indicated his/her understanding and acceptance.     Dental advisory given  Plan Discussed with: CRNA  Anesthesia Plan Comments:        Anesthesia Quick Evaluation

## 2022-11-17 ENCOUNTER — Inpatient Hospital Stay (HOSPITAL_COMMUNITY): Payer: HMO | Admitting: Physician Assistant

## 2022-11-17 ENCOUNTER — Inpatient Hospital Stay (HOSPITAL_COMMUNITY)
Admission: RE | Admit: 2022-11-17 | Discharge: 2022-11-19 | DRG: 164 | Disposition: A | Discharge status: Home / Self Care | Payer: HMO | Attending: Thoracic Surgery (Cardiothoracic Vascular Surgery) | Admitting: Thoracic Surgery (Cardiothoracic Vascular Surgery)

## 2022-11-17 ENCOUNTER — Encounter (HOSPITAL_COMMUNITY)
Admission: RE | Disposition: A | Discharge status: Home / Self Care | Payer: Self-pay | Source: Home / Self Care | Attending: Thoracic Surgery (Cardiothoracic Vascular Surgery)

## 2022-11-17 ENCOUNTER — Encounter (HOSPITAL_COMMUNITY): Payer: Self-pay | Admitting: Thoracic Surgery (Cardiothoracic Vascular Surgery)

## 2022-11-17 ENCOUNTER — Other Ambulatory Visit: Payer: Self-pay

## 2022-11-17 ENCOUNTER — Inpatient Hospital Stay (HOSPITAL_COMMUNITY): Payer: Self-pay | Admitting: Certified Registered Nurse Anesthetist

## 2022-11-17 ENCOUNTER — Inpatient Hospital Stay (HOSPITAL_COMMUNITY): Payer: HMO

## 2022-11-17 DIAGNOSIS — M542 Cervicalgia: Secondary | ICD-10-CM | POA: Diagnosis not present

## 2022-11-17 DIAGNOSIS — M797 Fibromyalgia: Secondary | ICD-10-CM | POA: Diagnosis not present

## 2022-11-17 DIAGNOSIS — Z7901 Long term (current) use of anticoagulants: Secondary | ICD-10-CM | POA: Diagnosis not present

## 2022-11-17 DIAGNOSIS — Z9089 Acquired absence of other organs: Secondary | ICD-10-CM

## 2022-11-17 DIAGNOSIS — M2550 Pain in unspecified joint: Secondary | ICD-10-CM | POA: Diagnosis present

## 2022-11-17 DIAGNOSIS — I4891 Unspecified atrial fibrillation: Secondary | ICD-10-CM | POA: Diagnosis present

## 2022-11-17 DIAGNOSIS — E7800 Pure hypercholesterolemia, unspecified: Secondary | ICD-10-CM | POA: Diagnosis present

## 2022-11-17 DIAGNOSIS — C3411 Malignant neoplasm of upper lobe, right bronchus or lung: Secondary | ICD-10-CM | POA: Diagnosis not present

## 2022-11-17 DIAGNOSIS — Z85828 Personal history of other malignant neoplasm of skin: Secondary | ICD-10-CM

## 2022-11-17 DIAGNOSIS — M549 Dorsalgia, unspecified: Secondary | ICD-10-CM | POA: Diagnosis not present

## 2022-11-17 DIAGNOSIS — Z9842 Cataract extraction status, left eye: Secondary | ICD-10-CM

## 2022-11-17 DIAGNOSIS — F32A Depression, unspecified: Secondary | ICD-10-CM | POA: Diagnosis not present

## 2022-11-17 DIAGNOSIS — G4730 Sleep apnea, unspecified: Secondary | ICD-10-CM | POA: Diagnosis present

## 2022-11-17 DIAGNOSIS — Z8719 Personal history of other diseases of the digestive system: Secondary | ICD-10-CM

## 2022-11-17 DIAGNOSIS — J9811 Atelectasis: Secondary | ICD-10-CM | POA: Diagnosis not present

## 2022-11-17 DIAGNOSIS — K219 Gastro-esophageal reflux disease without esophagitis: Secondary | ICD-10-CM | POA: Diagnosis not present

## 2022-11-17 DIAGNOSIS — K581 Irritable bowel syndrome with constipation: Secondary | ICD-10-CM | POA: Diagnosis present

## 2022-11-17 DIAGNOSIS — Z96643 Presence of artificial hip joint, bilateral: Secondary | ICD-10-CM | POA: Diagnosis present

## 2022-11-17 DIAGNOSIS — J4489 Other specified chronic obstructive pulmonary disease: Secondary | ICD-10-CM | POA: Diagnosis present

## 2022-11-17 DIAGNOSIS — Z452 Encounter for adjustment and management of vascular access device: Secondary | ICD-10-CM | POA: Diagnosis not present

## 2022-11-17 DIAGNOSIS — Z79899 Other long term (current) drug therapy: Secondary | ICD-10-CM | POA: Diagnosis not present

## 2022-11-17 DIAGNOSIS — Z87891 Personal history of nicotine dependence: Secondary | ICD-10-CM | POA: Diagnosis not present

## 2022-11-17 DIAGNOSIS — Z7982 Long term (current) use of aspirin: Secondary | ICD-10-CM | POA: Diagnosis not present

## 2022-11-17 DIAGNOSIS — Z791 Long term (current) use of non-steroidal anti-inflammatories (NSAID): Secondary | ICD-10-CM | POA: Diagnosis not present

## 2022-11-17 DIAGNOSIS — Z87311 Personal history of (healed) other pathological fracture: Secondary | ICD-10-CM | POA: Diagnosis not present

## 2022-11-17 DIAGNOSIS — J939 Pneumothorax, unspecified: Secondary | ICD-10-CM | POA: Diagnosis not present

## 2022-11-17 DIAGNOSIS — Z7951 Long term (current) use of inhaled steroids: Secondary | ICD-10-CM

## 2022-11-17 DIAGNOSIS — Z888 Allergy status to other drugs, medicaments and biological substances status: Secondary | ICD-10-CM

## 2022-11-17 DIAGNOSIS — Z4682 Encounter for fitting and adjustment of non-vascular catheter: Secondary | ICD-10-CM | POA: Diagnosis not present

## 2022-11-17 DIAGNOSIS — Z884 Allergy status to anesthetic agent status: Secondary | ICD-10-CM

## 2022-11-17 DIAGNOSIS — I10 Essential (primary) hypertension: Secondary | ICD-10-CM | POA: Diagnosis not present

## 2022-11-17 DIAGNOSIS — R918 Other nonspecific abnormal finding of lung field: Secondary | ICD-10-CM | POA: Diagnosis not present

## 2022-11-17 DIAGNOSIS — C771 Secondary and unspecified malignant neoplasm of intrathoracic lymph nodes: Secondary | ICD-10-CM | POA: Diagnosis not present

## 2022-11-17 DIAGNOSIS — Z9049 Acquired absence of other specified parts of digestive tract: Secondary | ICD-10-CM

## 2022-11-17 DIAGNOSIS — Z9889 Other specified postprocedural states: Secondary | ICD-10-CM

## 2022-11-17 DIAGNOSIS — F419 Anxiety disorder, unspecified: Secondary | ICD-10-CM | POA: Diagnosis not present

## 2022-11-17 DIAGNOSIS — Z885 Allergy status to narcotic agent status: Secondary | ICD-10-CM

## 2022-11-17 DIAGNOSIS — J90 Pleural effusion, not elsewhere classified: Secondary | ICD-10-CM | POA: Diagnosis not present

## 2022-11-17 DIAGNOSIS — Z9841 Cataract extraction status, right eye: Secondary | ICD-10-CM

## 2022-11-17 DIAGNOSIS — Z902 Acquired absence of lung [part of]: Principal | ICD-10-CM

## 2022-11-17 DIAGNOSIS — Z48813 Encounter for surgical aftercare following surgery on the respiratory system: Secondary | ICD-10-CM | POA: Diagnosis not present

## 2022-11-17 HISTORY — PX: NODE DISSECTION: SHX5269

## 2022-11-17 HISTORY — PX: INTERCOSTAL NERVE BLOCK: SHX5021

## 2022-11-17 SURGERY — LOBECTOMY, LUNG, ROBOT-ASSISTED, USING VATS
Anesthesia: General | Site: Chest | Laterality: Right

## 2022-11-17 MED ORDER — 0.9 % SODIUM CHLORIDE (POUR BTL) OPTIME
TOPICAL | Status: DC | PRN
  Administered 2022-11-17: 1000 mL

## 2022-11-17 MED ORDER — ROCURONIUM BROMIDE 10 MG/ML (PF) SYRINGE
PREFILLED_SYRINGE | INTRAVENOUS | Status: AC
  Filled 2022-11-17: qty 10

## 2022-11-17 MED ORDER — DORZOLAMIDE HCL 2 % OP SOLN
1.0000 [drp] | Freq: Every day | OPHTHALMIC | Status: DC
Start: 2022-11-17 — End: 2022-11-19
  Administered 2022-11-17 – 2022-11-19 (×3): 1 [drp] via OPHTHALMIC
  Filled 2022-11-17: qty 10

## 2022-11-17 MED ORDER — PROPOFOL 10 MG/ML IV BOLUS
INTRAVENOUS | Status: DC | PRN
  Administered 2022-11-17: 100 mg via INTRAVENOUS
  Administered 2022-11-17: 50 mg via INTRAVENOUS

## 2022-11-17 MED ORDER — ONDANSETRON HCL 4 MG/2ML IJ SOLN
INTRAMUSCULAR | Status: DC | PRN
  Administered 2022-11-17: 4 mg via INTRAVENOUS

## 2022-11-17 MED ORDER — ALPRAZOLAM 0.5 MG PO TABS
0.5000 mg | ORAL_TABLET | Freq: Two times a day (BID) | ORAL | Status: DC | PRN
  Administered 2022-11-17 – 2022-11-18 (×2): 0.5 mg via ORAL
  Filled 2022-11-17 (×2): qty 1

## 2022-11-17 MED ORDER — UMECLIDINIUM BROMIDE 62.5 MCG/ACT IN AEPB
1.0000 | INHALATION_SPRAY | Freq: Every day | RESPIRATORY_TRACT | Status: DC
  Administered 2022-11-18 – 2022-11-19 (×2): 1 via RESPIRATORY_TRACT
  Filled 2022-11-17: qty 7

## 2022-11-17 MED ORDER — ENOXAPARIN SODIUM 40 MG/0.4ML IJ SOSY
40.0000 mg | PREFILLED_SYRINGE | Freq: Every day | INTRAMUSCULAR | Status: DC
Start: 2022-11-17 — End: 2022-11-19
  Administered 2022-11-17 – 2022-11-19 (×3): 40 mg via SUBCUTANEOUS
  Filled 2022-11-17 (×3): qty 0.4

## 2022-11-17 MED ORDER — GABAPENTIN 300 MG PO CAPS
300.0000 mg | ORAL_CAPSULE | Freq: Four times a day (QID) | ORAL | Status: DC | PRN
Start: 2022-11-17 — End: 2022-11-19
  Administered 2022-11-17 – 2022-11-19 (×4): 300 mg via ORAL
  Filled 2022-11-17 (×4): qty 1

## 2022-11-17 MED ORDER — PANTOPRAZOLE SODIUM 40 MG PO TBEC
40.0000 mg | DELAYED_RELEASE_TABLET | Freq: Every day | ORAL | Status: DC
  Administered 2022-11-18 – 2022-11-19 (×2): 40 mg via ORAL
  Filled 2022-11-17 (×2): qty 1

## 2022-11-17 MED ORDER — METOPROLOL TARTRATE 25 MG PO TABS
25.0000 mg | ORAL_TABLET | Freq: Every day | ORAL | Status: DC
  Administered 2022-11-17: 25 mg via ORAL
  Filled 2022-11-17 (×2): qty 1

## 2022-11-17 MED ORDER — TRAMADOL HCL 50 MG PO TABS
50.0000 mg | ORAL_TABLET | Freq: Four times a day (QID) | ORAL | Status: DC | PRN
  Administered 2022-11-17: 50 mg via ORAL
  Administered 2022-11-17 – 2022-11-19 (×4): 100 mg via ORAL
  Filled 2022-11-17 (×4): qty 2

## 2022-11-17 MED ORDER — ONDANSETRON HCL 4 MG/2ML IJ SOLN: 4.0000 mg | Freq: Four times a day (QID) | INTRAMUSCULAR | Status: DC | PRN

## 2022-11-17 MED ORDER — LACTATED RINGERS IV SOLN: INTRAVENOUS | Status: DC

## 2022-11-17 MED ORDER — ONDANSETRON HCL 4 MG/2ML IJ SOLN
INTRAMUSCULAR | Status: AC
  Filled 2022-11-17: qty 2

## 2022-11-17 MED ORDER — DOXEPIN HCL 25 MG PO CAPS
100.0000 mg | ORAL_CAPSULE | Freq: Every day | ORAL | Status: DC
  Administered 2022-11-17 – 2022-11-18 (×2): 100 mg via ORAL
  Filled 2022-11-17 (×2): qty 4
  Filled 2022-11-17: qty 2

## 2022-11-17 MED ORDER — LATANOPROST 0.005 % OP SOLN
1.0000 [drp] | Freq: Every day | OPHTHALMIC | Status: DC
  Administered 2022-11-17 – 2022-11-18 (×2): 1 [drp] via OPHTHALMIC
  Filled 2022-11-17: qty 2.5

## 2022-11-17 MED ORDER — CEFAZOLIN SODIUM-DEXTROSE 2-4 GM/100ML-% IV SOLN
2.0000 g | INTRAVENOUS | Status: AC
  Administered 2022-11-17: 2 g via INTRAVENOUS
  Filled 2022-11-17: qty 100

## 2022-11-17 MED ORDER — CEFAZOLIN SODIUM-DEXTROSE 2-4 GM/100ML-% IV SOLN
2.0000 g | Freq: Three times a day (TID) | INTRAVENOUS | Status: AC
  Administered 2022-11-17 (×2): 2 g via INTRAVENOUS
  Filled 2022-11-17 (×2): qty 100

## 2022-11-17 MED ORDER — SODIUM CHLORIDE FLUSH 0.9 % IV SOLN
INTRAVENOUS | Status: DC | PRN
  Administered 2022-11-17: 100 mL

## 2022-11-17 MED ORDER — CHLORHEXIDINE GLUCONATE CLOTH 2 % EX PADS
6.0000 | MEDICATED_PAD | Freq: Every day | CUTANEOUS | Status: DC
Start: 2022-11-17 — End: 2022-11-19
  Administered 2022-11-17 – 2022-11-19 (×3): 6 via TOPICAL

## 2022-11-17 MED ORDER — PRAVASTATIN SODIUM 40 MG PO TABS
20.0000 mg | ORAL_TABLET | ORAL | Status: DC
  Filled 2022-11-17: qty 1

## 2022-11-17 MED ORDER — FLUTICASONE FUROATE-VILANTEROL 100-25 MCG/ACT IN AEPB
1.0000 | INHALATION_SPRAY | Freq: Every day | RESPIRATORY_TRACT | Status: DC
  Administered 2022-11-18 – 2022-11-19 (×2): 1 via RESPIRATORY_TRACT
  Filled 2022-11-17: qty 28

## 2022-11-17 MED ORDER — FENTANYL CITRATE (PF) 100 MCG/2ML IJ SOLN: 25.0000 ug | INTRAMUSCULAR | Status: DC | PRN

## 2022-11-17 MED ORDER — ALLOPURINOL 300 MG PO TABS
300.0000 mg | ORAL_TABLET | Freq: Every day | ORAL | Status: DC
  Administered 2022-11-18 – 2022-11-19 (×2): 300 mg via ORAL
  Filled 2022-11-17 (×2): qty 1

## 2022-11-17 MED ORDER — FENTANYL CITRATE (PF) 250 MCG/5ML IJ SOLN
INTRAMUSCULAR | Status: DC | PRN
  Administered 2022-11-17 (×2): 50 ug via INTRAVENOUS
  Administered 2022-11-17: 100 ug via INTRAVENOUS

## 2022-11-17 MED ORDER — EPHEDRINE SULFATE-NACL 50-0.9 MG/10ML-% IV SOSY
PREFILLED_SYRINGE | INTRAVENOUS | Status: DC | PRN
  Administered 2022-11-17: 10 mg via INTRAVENOUS

## 2022-11-17 MED ORDER — ROCURONIUM BROMIDE 10 MG/ML (PF) SYRINGE
PREFILLED_SYRINGE | INTRAVENOUS | Status: DC | PRN
  Administered 2022-11-17: 80 mg via INTRAVENOUS

## 2022-11-17 MED ORDER — PRAVASTATIN SODIUM 40 MG PO TABS: 20.0000 mg | ORAL_TABLET | ORAL | Status: DC

## 2022-11-17 MED ORDER — ACETAMINOPHEN 500 MG PO TABS
1000.0000 mg | ORAL_TABLET | Freq: Once | ORAL | Status: DC
  Filled 2022-11-17: qty 2

## 2022-11-17 MED ORDER — SUGAMMADEX SODIUM 200 MG/2ML IV SOLN
INTRAVENOUS | Status: DC | PRN
  Administered 2022-11-17: 160 mg via INTRAVENOUS

## 2022-11-17 MED ORDER — ALBUTEROL SULFATE (2.5 MG/3ML) 0.083% IN NEBU: 3.0000 mL | INHALATION_SOLUTION | Freq: Four times a day (QID) | RESPIRATORY_TRACT | Status: DC | PRN

## 2022-11-17 MED ORDER — LIDOCAINE 2% (20 MG/ML) 5 ML SYRINGE
INTRAMUSCULAR | Status: AC
  Filled 2022-11-17: qty 5

## 2022-11-17 MED ORDER — LACTATED RINGERS IV SOLN: INTRAVENOUS | Status: DC | PRN

## 2022-11-17 MED ORDER — DEXAMETHASONE SODIUM PHOSPHATE 10 MG/ML IJ SOLN
INTRAMUSCULAR | Status: DC | PRN
  Administered 2022-11-17: 10 mg via INTRAVENOUS

## 2022-11-17 MED ORDER — PHENYLEPHRINE 80 MCG/ML (10ML) SYRINGE FOR IV PUSH (FOR BLOOD PRESSURE SUPPORT)
PREFILLED_SYRINGE | INTRAVENOUS | Status: AC
  Filled 2022-11-17: qty 10

## 2022-11-17 MED ORDER — DEXAMETHASONE SODIUM PHOSPHATE 10 MG/ML IJ SOLN
INTRAMUSCULAR | Status: AC
  Filled 2022-11-17: qty 1

## 2022-11-17 MED ORDER — BUPIVACAINE HCL (PF) 0.5 % IJ SOLN
INTRAMUSCULAR | Status: AC
  Filled 2022-11-17: qty 30

## 2022-11-17 MED ORDER — SENNOSIDES-DOCUSATE SODIUM 8.6-50 MG PO TABS
1.0000 | ORAL_TABLET | Freq: Every day | ORAL | Status: DC
  Administered 2022-11-17 – 2022-11-18 (×2): 1 via ORAL
  Filled 2022-11-17 (×2): qty 1

## 2022-11-17 MED ORDER — ACETAMINOPHEN 500 MG PO TABS
1000.0000 mg | ORAL_TABLET | Freq: Four times a day (QID) | ORAL | Status: DC
  Administered 2022-11-17 – 2022-11-19 (×7): 1000 mg via ORAL
  Filled 2022-11-17 (×7): qty 2

## 2022-11-17 MED ORDER — BISACODYL 5 MG PO TBEC
10.0000 mg | DELAYED_RELEASE_TABLET | Freq: Every day | ORAL | Status: DC
  Administered 2022-11-17 – 2022-11-19 (×3): 10 mg via ORAL
  Filled 2022-11-17 (×3): qty 2

## 2022-11-17 MED ORDER — CHLORHEXIDINE GLUCONATE 0.12 % MT SOLN
15.0000 mL | Freq: Once | OROMUCOSAL | Status: AC
  Administered 2022-11-17: 15 mL via OROMUCOSAL
  Filled 2022-11-17: qty 15

## 2022-11-17 MED ORDER — FENTANYL CITRATE (PF) 250 MCG/5ML IJ SOLN
INTRAMUSCULAR | Status: AC
  Filled 2022-11-17: qty 5

## 2022-11-17 MED ORDER — PROPOFOL 10 MG/ML IV BOLUS
INTRAVENOUS | Status: AC
  Filled 2022-11-17: qty 20

## 2022-11-17 MED ORDER — ACETAMINOPHEN 160 MG/5ML PO SOLN: 1000.0000 mg | Freq: Four times a day (QID) | ORAL | Status: DC

## 2022-11-17 MED ORDER — FENTANYL CITRATE PF 50 MCG/ML IJ SOSY
25.0000 ug | PREFILLED_SYRINGE | INTRAMUSCULAR | Status: DC | PRN
  Administered 2022-11-17: 25 ug via INTRAVENOUS
  Administered 2022-11-18 (×2): 50 ug via INTRAVENOUS
  Filled 2022-11-17 (×3): qty 1

## 2022-11-17 MED ORDER — EPHEDRINE 5 MG/ML INJ
INTRAVENOUS | Status: AC
  Filled 2022-11-17: qty 5

## 2022-11-17 MED ORDER — BUPIVACAINE LIPOSOME 1.3 % IJ SUSP
INTRAMUSCULAR | Status: AC
  Filled 2022-11-17: qty 20

## 2022-11-17 MED ORDER — ORAL CARE MOUTH RINSE: 15.0000 mL | Freq: Once | OROMUCOSAL | Status: AC

## 2022-11-17 MED ORDER — PHENYLEPHRINE HCL-NACL 20-0.9 MG/250ML-% IV SOLN
INTRAVENOUS | Status: DC | PRN
  Administered 2022-11-17: 25 ug/min via INTRAVENOUS

## 2022-11-17 MED ORDER — TRAMADOL HCL 50 MG PO TABS
ORAL_TABLET | ORAL | Status: AC
  Filled 2022-11-17: qty 1

## 2022-11-17 SURGICAL SUPPLY — 77 items
ADH SKN CLS APL DERMABOND .7 (GAUZE/BANDAGES/DRESSINGS) ×1
APL PRP STRL LF DISP 70% ISPRP (MISCELLANEOUS) ×1
BAG SPEC RTRVL C125 8X14 (MISCELLANEOUS) ×1
BLADE SURG 11 STRL SS (BLADE) ×1 IMPLANT
CANISTER SUCT 3000ML PPV (MISCELLANEOUS) ×2 IMPLANT
CANNULA REDUCER 12-8 DVNC XI (CANNULA) ×2 IMPLANT
CATH THORACIC 28FR (CATHETERS) ×1 IMPLANT
CHLORAPREP W/TINT 26 (MISCELLANEOUS) ×1 IMPLANT
CNTNR URN SCR LID CUP LEK RST (MISCELLANEOUS) ×5 IMPLANT
CONT SPEC 4OZ STRL OR WHT (MISCELLANEOUS) ×5
DEFOGGER SCOPE WARMER CLEARIFY (MISCELLANEOUS) ×1 IMPLANT
DERMABOND ADVANCED .7 DNX12 (GAUZE/BANDAGES/DRESSINGS) ×1 IMPLANT
DRAIN CHANNEL 28F RND 3/8 FF (WOUND CARE) IMPLANT
DRAPE ARM DVNC X/XI (DISPOSABLE) ×4 IMPLANT
DRAPE COLUMN DVNC XI (DISPOSABLE) ×1 IMPLANT
DRAPE CV SPLIT W-CLR ANES SCRN (DRAPES) ×1 IMPLANT
DRAPE ORTHO SPLIT 77X108 STRL (DRAPES) ×1
DRAPE SURG ORHT 6 SPLT 77X108 (DRAPES) ×1 IMPLANT
ELECT BLADE 6.5 EXT (BLADE) IMPLANT
ELECT REM PT RETURN 9FT ADLT (ELECTROSURGICAL) ×1
ELECTRODE REM PT RTRN 9FT ADLT (ELECTROSURGICAL) ×1 IMPLANT
FORCEPS BPLR LNG DVNC XI (INSTRUMENTS) IMPLANT
FORCEPS CADIERE DVNC XI (FORCEP) IMPLANT
GAUZE KITTNER 4X8 (MISCELLANEOUS) ×1 IMPLANT
GAUZE SPONGE 4X4 12PLY STRL (GAUZE/BANDAGES/DRESSINGS) ×1 IMPLANT
GLOVE BIO SURGEON STRL SZ7.5 (GLOVE) ×2 IMPLANT
GLOVE SURG POLYISO LF SZ8 (GLOVE) ×1 IMPLANT
GOWN STRL REUS W/ TWL LRG LVL3 (GOWN DISPOSABLE) ×2 IMPLANT
GOWN STRL REUS W/ TWL XL LVL3 (GOWN DISPOSABLE) ×2 IMPLANT
GOWN STRL REUS W/TWL 2XL LVL3 (GOWN DISPOSABLE) ×1 IMPLANT
GOWN STRL REUS W/TWL LRG LVL3 (GOWN DISPOSABLE) ×2
GOWN STRL REUS W/TWL XL LVL3 (GOWN DISPOSABLE) ×2
GRASPER TIP-UP FEN DVNC XI (INSTRUMENTS) IMPLANT
HEMOSTAT SURGICEL 2X14 (HEMOSTASIS) ×1 IMPLANT
IRRIGATION STRYKERFLOW (MISCELLANEOUS) IMPLANT
IRRIGATOR STRYKERFLOW (MISCELLANEOUS)
KIT BASIN OR (CUSTOM PROCEDURE TRAY) ×1 IMPLANT
KIT TURNOVER KIT B (KITS) ×1 IMPLANT
NDL 22X1.5 STRL (OR ONLY) (MISCELLANEOUS) ×1 IMPLANT
NEEDLE 22X1.5 STRL (OR ONLY) (MISCELLANEOUS) ×1
NS IRRIG 1000ML POUR BTL (IV SOLUTION) ×3 IMPLANT
PACK CHEST (CUSTOM PROCEDURE TRAY) ×1 IMPLANT
PAD ARMBOARD 7.5X6 YLW CONV (MISCELLANEOUS) ×5 IMPLANT
RELOAD STAPLE 45 3.5 BLU DVNC (STAPLE) IMPLANT
RELOAD STAPLE 45 4.3 GRN DVNC (STAPLE) IMPLANT
RELOAD STAPLE 45 4.6 BLK DVNC (STAPLE) IMPLANT
SCISSORS LAP 5X35 DISP (ENDOMECHANICALS) IMPLANT
SEAL UNIV 5-12 XI (MISCELLANEOUS) ×4 IMPLANT
SET TRI-LUMEN FLTR TB AIRSEAL (TUBING) ×1 IMPLANT
SPONGE TONSIL 1 RF SGL (DISPOSABLE) IMPLANT
STAPLER 45 SUREFORM CVD DVNC (STAPLE) IMPLANT
STAPLER RELOAD 3.5X45 BLU DVNC (STAPLE) ×4
STAPLER RELOAD 4.3X45 GRN DVNC (STAPLE) ×2
STAPLER RELOAD 45 4.6 BLK DVNC (STAPLE) ×2
STOPCOCK 4 WAY LG BORE MALE ST (IV SETS) ×1 IMPLANT
SUT MON AB 2-0 CT1 36 (SUTURE) IMPLANT
SUT PDS AB 1 CTX 36 (SUTURE) IMPLANT
SUT SILK 1 MH (SUTURE) ×1 IMPLANT
SUT SILK 2 0 SH (SUTURE) IMPLANT
SUT SILK 2 0SH CR/8 30 (SUTURE) IMPLANT
SUT VIC AB 2-0 CT1 27 (SUTURE) ×1
SUT VIC AB 2-0 CT1 TAPERPNT 27 (SUTURE) ×1 IMPLANT
SUT VIC AB 3-0 SH 27 (SUTURE) ×2
SUT VIC AB 3-0 SH 27X BRD (SUTURE) ×2 IMPLANT
SUT VICRYL 0 TIES 12 18 (SUTURE) ×1 IMPLANT
SUT VICRYL 0 UR6 27IN ABS (SUTURE) ×2 IMPLANT
SUT VICRYL 2 TP 1 (SUTURE) IMPLANT
SYR 10ML LL (SYRINGE) ×1 IMPLANT
SYR 20ML LL LF (SYRINGE) ×1 IMPLANT
SYR 50ML LL SCALE MARK (SYRINGE) ×1 IMPLANT
SYSTEM RETRIEVAL ANCHOR 8 (MISCELLANEOUS) IMPLANT
SYSTEM SAHARA CHEST DRAIN ATS (WOUND CARE) ×1 IMPLANT
TAPE CLOTH 4X10 WHT NS (GAUZE/BANDAGES/DRESSINGS) ×1 IMPLANT
TIP APPLICATOR SPRAY EXTEND 16 (VASCULAR PRODUCTS) IMPLANT
TOWEL GREEN STERILE (TOWEL DISPOSABLE) ×1 IMPLANT
TRAY FOLEY MTR SLVR 16FR STAT (SET/KITS/TRAYS/PACK) ×1 IMPLANT
TUBING EXTENTION W/L.L. (IV SETS) ×1 IMPLANT

## 2022-11-17 NOTE — Anesthesia Procedure Notes (Signed)
Central Venous Catheter Insertion Performed by: Gaynelle Adu, MD, anesthesiologist Start/End10/21/2024 8:25 AM, 11/17/2022 8:40 AM Patient location: Pre-op. Preanesthetic checklist: patient identified, IV checked, site marked, risks and benefits discussed, surgical consent, monitors and equipment checked, pre-op evaluation, timeout performed and anesthesia consent Position: Trendelenburg Lidocaine 1% used for infiltration and patient sedated Hand hygiene performed , maximum sterile barriers used  and Seldinger technique used Catheter size: 8 Fr Total catheter length 16. Central line was placed.Double lumen Procedure performed without using ultrasound guided technique. Attempts: 1 Following insertion, dressing applied, line sutured and Biopatch. Post procedure assessment: blood return through all ports and no air  Patient tolerated the procedure well with no immediate complications.

## 2022-11-17 NOTE — Hospital Course (Addendum)
History of Present Illness:    Zoe [name] 87 y.o. female presents for surgical evaluation of a 2.3cm right upper lobe lung cancer with evidence of N1 disease.  She quit smoking about 65yrs,ago.  She was enrolled in the lung cancer screening program.  She has a hx of multiple compression fractures, and spinal surgery, and currently walks with a cane.  She states that she is very mobile, and active.  Dr. Cliffton Asters reviewed the patient's diagnostic studies and determined she would benefit from surgical intervention. He reviewed the treatment options as well as the risks and benefits with the patient. Zoe [name] was agreeable to proceed with surgery.   Hospital Course: Zoe [name] presented to [facility] and was brought to the operating room on 11/17/22. She underwent Xi robotic assisted right upper lobe wedge resection, lymph node dissection and intercostal nerve block. She tolerated the procedure well, was extubated and transferred to the PACU in stable condition. CXR was without a pneumothorax. Chest tube was without an air leak on POD1. Chest tube was removed without complication, CXR remained stable. Oxygen was weaned and she began ambulating well on RA. Her incisions were healing well without sign of infection. The patient began moving her bowels. She was felt stable for discharge.

## 2022-11-17 NOTE — Anesthesia Procedure Notes (Signed)
Procedure Name: Intubation Date/Time: 11/17/2022 9:22 AM  Performed by: Dairl Ponder, CRNAPre-anesthesia Checklist: Patient identified, Emergency Drugs available, Suction available and Patient being monitored Patient Re-evaluated:Patient Re-evaluated prior to induction Oxygen Delivery Method: Circle System Utilized Preoxygenation: Pre-oxygenation with 100% oxygen Induction Type: IV induction Ventilation: Mask ventilation without difficulty Tube type: Oral Endobronchial tube: Left and Double lumen EBT and 37 Fr Number of attempts: 2 Airway Equipment and Method: Stylet and Oral airway Placement Confirmation: ETT inserted through vocal cords under direct vision, positive ETCO2 and breath sounds checked- equal and bilateral Tube secured with: Tape Dental Injury: Teeth and Oropharynx as per pre-operative assessment

## 2022-11-17 NOTE — H&P (Signed)
[address]       [place] [address]             [phone]                                                   Zoe [name] [place] Medical Record #[id number] Date of Birth: 23-Dec-1935   Referring: Bevelyn Ngo, NP Primary Care: Ignatius Specking, MD Primary Cardiologist: Marjo Bicker, MD   Chief Complaint:        Chief Complaint  Patient presents with   Lung Cancer      New patient consult, review all studies      History of Present Illness:    Zoe [name] 87 y.o. female presents for surgical evaluation of a 2.3cm right upper lobe lung cancer with evidence of N1 disease.  She quit smoking about 55yrs,ago.  She was enrolled in the lung cancer screening program.  She has a hx of multiple compression fractures, and spinal surgery, and currently walks with a cane.  She states that she is very mobile, and active.           Past Medical History:  Diagnosis Date   Abdominal discomfort 11/02/2012    suspected ?UTI- PCP MD placed on Cipro   Anxiety     Arthritis     Asthma     Cancer (HCC)      skin CA   Choking due to phlegm     Chronic constipation     Depression     Dysrhythmia      tachycardia   Environmental allergies     Fibromyalgia     GERD (gastroesophageal reflux disease)     Hemorrhoids     Hypercholesteremia     Hypertension     Sleep apnea                 Past Surgical History:  Procedure Laterality Date   ANAL RECTAL MANOMETRY N/A 11/28/2013    Procedure: ANO RECTAL MANOMETRY;  Surgeon: Romie Levee, MD;  Location: [facility];  Service: Endoscopy;  Laterality: N/A;   APPENDECTOMY       BACK SURGERY        X2    BIOPSY   03/13/2021    Procedure: BIOPSY;  Surgeon: Malissa Hippo, MD;  Location: [facility];  Service: Endoscopy;;   BRONCHIAL BIOPSY   09/02/2022    Procedure: BRONCHIAL BIOPSIES;  Surgeon: Josephine Igo, DO;  Location: [facility];  Service: Pulmonary;;   BRONCHIAL BRUSHINGS   09/02/2022     Procedure: BRONCHIAL BRUSHINGS;  Surgeon: Josephine Igo, DO;  Location: [facility];  Service: Pulmonary;;   BRONCHIAL NEEDLE ASPIRATION BIOPSY   09/02/2022    Procedure: BRONCHIAL NEEDLE ASPIRATION BIOPSIES;  Surgeon: Josephine Igo, DO;  Location: [facility];  Service: Pulmonary;;   CHOLECYSTECTOMY       COLONOSCOPY   03/29/03   COLONOSCOPY N/A 12/09/2013    Procedure: COLONOSCOPY;  Surgeon: Malissa Hippo, MD;  [facility];  Service: Endoscopy;  Laterality: N/A;  730   COLONOSCOPY N/A 02/04/2017    Procedure: COLONOSCOPY;  Surgeon: Malissa Hippo, MD;  [facility]: [facility];  Service: Endoscopy;  Laterality: N/A;  930   ENDOBRONCHIAL ULTRASOUND Bilateral  09/02/2022    Procedure: ENDOBRONCHIAL ULTRASOUND;  Surgeon: Josephine Igo, DO;  Location: [facility];  Service: Pulmonary;  Laterality: Bilateral;   ESOPHAGEAL DILATION N/A 03/13/2021    Procedure: ESOPHAGEAL DILATION;  Surgeon: Malissa Hippo, MD;  [facility];  Service: Endoscopy;  Laterality: N/A;   ESOPHAGOGASTRODUODENOSCOPY (EGD) WITH PROPOFOL N/A 03/13/2021    Procedure: ESOPHAGOGASTRODUODENOSCOPY (EGD) WITH PROPOFOL;  Surgeon: Malissa Hippo, MD;  [facility];  Service: Endoscopy;  Laterality: N/A;  8:05   EYE SURGERY        bilateral cataract removal   IR KYPHO LUMBAR INC FX REDUCE BONE BX UNI/BIL CANNULATION INC/IMAGING   11/24/2019   IR KYPHO THORACIC WITH BONE BIOPSY   08/23/2019   IR KYPHO THORACIC WITH BONE BIOPSY   11/24/2019   IR RADIOLOGIST EVAL & MGMT   08/03/2019   IR RADIOLOGIST EVAL & MGMT   09/29/2019   IR RADIOLOGIST EVAL & MGMT   10/18/2019   IR RADIOLOGIST EVAL & MGMT   04/11/2020   SKIN CANCER EXCISION        RIGHT NECK     TONSILLECTOMY        T+A   TOTAL HIP ARTHROPLASTY Right 09/14/2012    Procedure: RIGHT TOTAL HIP ARTHROPLASTY ANTERIOR APPROACH and RIGHT KNEE STEROID INJECTION;  Surgeon: Kathryne Hitch, MD;  Location: [facility];  Service: Orthopedics;   Laterality: Right;   TOTAL HIP ARTHROPLASTY Left 11/05/2012    Procedure: LEFT TOTAL HIP ARTHROPLASTY ANTERIOR APPROACH;  Surgeon: Kathryne Hitch, MD;  Location: [facility];  Service: Orthopedics;  Laterality: Left;   UMBILICAL HERNIA REPAIR   04/26/03    JENKINS   UPPER GASTROINTESTINAL ENDOSCOPY   08/03/2009    NUR   UPPER GASTROINTESTINAL ENDOSCOPY   03/29/2003    EGD ED TCS               Family History  Problem Relation Age of Onset   Healthy Son     Healthy Son     Healthy Daughter     Colon cancer Neg Hx              Tobacco Use History  Social History        Tobacco Use  Smoking Status Former   Current packs/day: 0.00   Average packs/day: 0.5 packs/day for 50.0 years (25.0 ttl pk-yrs)   Types: Cigarettes   Start date: 09/07/1970   Quit date: 09/06/2020   Years since quitting: 2.1  Smokeless Tobacco Never      Social History        Substance and Sexual Activity  Alcohol Use Yes   Alcohol/week: 0.0 standard drinks of alcohol    Comment: rare        Allergies       Allergies  Allergen Reactions   Codeine Other (See Comments)      Pains in abdominal area   Other        NOVACAINE     ITCHING Anti-depressants   Oxycodone        Abdominal pain, chest pain   Paxil [Paroxetine Hcl] Itching   Procaine Itching              Current Outpatient Medications  Medication Sig Dispense Refill   acetaminophen (TYLENOL) 500 MG tablet Take 500 mg by mouth. 3 -5 per day       albuterol (PROVENTIL HFA;VENTOLIN HFA) 108 (90 BASE) MCG/ACT inhaler Inhale 2 puffs into  the lungs every 6 (six) hours as needed for wheezing or shortness of breath.       allopurinol (ZYLOPRIM) 300 MG tablet Take 1 tablet (300 mg total) by mouth daily. 90 tablet 1   ALPRAZolam (XANAX) 0.5 MG tablet Take 0.5 mg by mouth at bedtime.       apixaban (ELIQUIS) 5 MG TABS tablet Take 1 tablet (5 mg total) by mouth 2 (two) times daily. 180 tablet 1   aspirin 81 MG tablet Take 81 mg by mouth 2  (two) times daily. 30 tablet     Biotin 44034 MCG TABS Take 10,000 mcg by mouth daily.       calcium-vitamin D 250-100 MG-UNIT tablet Take 1 tablet by mouth daily at 6 (six) AM.       Cranberry-Vitamin C (CRANBERRY CONCENTRATE/VITAMINC PO) Take 250 mg by mouth daily.       cyanocobalamin 2000 MCG tablet Take 5,000 mcg by mouth daily.       diclofenac Sodium (VOLTAREN) 1 % GEL Apply 2-4 g topically 4 (four) times daily. APPLY 2-4 GRAMS TOPICALLY FOUR TIMES DAILY Strength: 1 % 200 g 3   doxepin (SINEQUAN) 100 MG capsule Take 1 capsule (100 mg total) by mouth at bedtime. 90 capsule 1   Fluticasone-Umeclidin-Vilant (TRELEGY ELLIPTA) 100-62.5-25 MCG/ACT AEPB Inhale 1 puff into the lungs daily. 60 each 11   gabapentin (NEURONTIN) 300 MG capsule Take 300 mg by mouth 2 (two) times daily.       levocetirizine (XYZAL) 5 MG tablet Take 5 mg by mouth every evening.       lubiprostone (AMITIZA) 24 MCG capsule Take 1 capsule (24 mcg total) by mouth 2 (two) times daily with a meal. 180 capsule 3   meloxicam (MOBIC) 15 MG tablet Take 15 mg by mouth daily.       metoprolol tartrate (LOPRESSOR) 25 MG tablet Take 0.5 tablets (12.5 mg total) by mouth 2 (two) times daily. 135 tablet 2   omeprazole (PRILOSEC) 40 MG capsule Take 1 capsule (40 mg total) by mouth daily. 90 capsule 3   OVER THE COUNTER MEDICATION Take 1 tablet by mouth daily. OTC "potassium 99 mg"       OVER THE COUNTER MEDICATION Take 1 tablet by mouth daily. " Magnesium 400 mg"       OVER THE COUNTER MEDICATION Take 1 Dose by mouth daily. " Apple cider vinegar"       pravastatin (PRAVACHOL) 20 MG tablet Take 20 mg by mouth as directed. 3 x week       prochlorperazine (COMPAZINE) 10 MG tablet Take 1 tablet (10 mg total) by mouth every 6 (six) hours as needed for nausea or vomiting. 30 tablet 1   Travoprost, BAK Free, (TRAVATAN) 0.004 % SOLN ophthalmic solution Place 1 drop into both eyes at bedtime.          No current facility-administered  medications for this visit.        Review of Systems  Constitutional:  Negative for malaise/fatigue.  Respiratory:  Negative for cough and shortness of breath.   Cardiovascular:  Negative for chest pain.  Musculoskeletal:  Positive for back pain, joint pain, myalgias and neck pain.  Neurological: Negative.         PHYSICAL EXAMINATION: BP 113/71 (BP Location: Right Arm, Patient Position: Sitting, Cuff Size: Normal)   Pulse 67   Resp 20   Ht 5' 4.5" (1.638 m)   Wt 171 lb 3.2 oz (77.7 kg)  SpO2 92% Comment: RA  BMI 28.93 kg/m  Physical Exam Constitutional:      General: She is not in acute distress.    Appearance: She is not ill-appearing.  HENT:     Head: Normocephalic and atraumatic.  Eyes:     Extraocular Movements: Extraocular movements intact.  Cardiovascular:     Rate and Rhythm: Normal rate.  Pulmonary:     Effort: Pulmonary effort is normal. No respiratory distress.  Abdominal:     General: There is no distension.  Musculoskeletal:        General: Normal range of motion.     Cervical back: Normal range of motion.  Skin:    General: Skin is warm and dry.  Neurological:     General: No focal deficit present.     Mental Status: She is alert and oriented to person, place, and time.                I have independently reviewed the above radiology studies  and reviewed the findings with the patient.    Recent Lab Findings: Recent Labs       Lab Results  Component Value Date    WBC 6.1 09/17/2022    HGB 11.4 (L) 09/17/2022    HCT 35.5 (L) 09/17/2022    PLT 178 09/17/2022    GLUCOSE 118 (H) 09/17/2022    CHOL 155 03/29/2020    TRIG 150 (H) 03/29/2020    HDL 49 03/29/2020    LDLCALC 80 03/29/2020    ALT 9 09/17/2022    AST 11 (L) 09/17/2022    NA 142 09/17/2022    K 4.5 09/17/2022    CL 108 09/17/2022    CREATININE 0.69 09/17/2022    BUN 19 09/17/2022    CO2 28 09/17/2022    INR 1.0 11/24/2019    HGBA1C 5.0 03/29/2020        Diagnostic  Studies & Laboratory data:     Recent Radiology Findings:    Imaging Results  MR Brain W Wo Contrast   Result Date: 09/20/2022 CLINICAL DATA:  Provided history: Malignant neoplasm of unspecified part of unspecified bronchus or lung. Non-small cell lung cancer, staging. EXAM: MRI HEAD WITHOUT AND WITH CONTRAST TECHNIQUE: Multiplanar, multiecho pulse sequences of the brain and surrounding structures were obtained without and with intravenous contrast. CONTRAST:  9mL GADAVIST GADOBUTROL 1 MMOL/ML IV SOLN COMPARISON:  Brain MRI 09/22/2017. FINDINGS: Brain: No age advanced or lobar predominant parenchymal atrophy. Multifocal T2 FLAIR hyperintense signal abnormality within the cerebral white matter, nonspecific but compatible with moderate chronic small vessel ischemic disease. There is no acute infarct. No evidence of an intracranial mass. No chronic intracranial blood products. No extra-axial fluid collection. No midline shift. No pathologic intracranial enhancement identified. Vascular: Maintained flow voids within the proximal large arterial vessels. Skull and upper cervical spine: No focal suspicious marrow lesion. Slight C3-C4 grade 1 anterolisthesis. Sinuses/Orbits: No mass or acute finding within the imaged orbits. Prior bilateral ocular lens replacement. No significant paranasal sinus disease. Other: Small-volume fluid within the right mastoid air cells. IMPRESSION: 1. No evidence of intracranial metastatic disease. 2. Moderate chronic small vessel ischemic changes within the cerebral white matter, progressed from the prior brain MRI of 09/22/2017. 3. Small-volume fluid within the left mastoid air cells. Electronically Signed   By: Jackey Loge D.O.   On: 09/20/2022 20:41         PFTs:   - FVC: 100% - FEV1: 101% -DLCO: 92%  Assessment / Plan:   87yo female with right upper NCSLC, and positive N1 lymph node.  She also has a hx of atrial fibrillation, and multiple compression fractures.   We discussed several treatment options, and she is still leaning towards surgery.  We discussed the risks and benefits of R RATS, RULectomy.  She is agreeable to proceed.

## 2022-11-17 NOTE — Transfer of Care (Signed)
Immediate Anesthesia Transfer of Care Note  Patient: Zoe [name]  Procedure(s) Performed: XI ROBOTIC ASSISTED THORACOSCOPY-RIGHT UPPER LOBE WEDGE (Right: Chest) NODE DISSECTION (Right: Chest) INTERCOSTAL NERVE BLOCK (Right: Chest)  Patient Location: PACU  Anesthesia Type:General  Level of Consciousness: awake, alert , and oriented  Airway & Oxygen Therapy: Patient Spontanous Breathing  Post-op Assessment: Report given to RN and Post -op Vital signs reviewed and stable  Post vital signs: Reviewed and stable  Last Vitals:  Vitals Value Taken Time  BP    Temp    Pulse 77 11/17/22 1152  Resp 15 11/17/22 1152  SpO2 92 % 11/17/22 1152  Vitals shown include unfiled device data.  Last Pain:  Vitals:   11/17/22 0750  TempSrc:   PainSc: 0-No pain         Complications: No notable events documented.

## 2022-11-17 NOTE — Plan of Care (Signed)
  Problem: Education: Goal: Knowledge of General Education information will improve Description: Including pain rating scale, medication(s)/side effects and non-pharmacologic comfort measures Outcome: Progressing   Problem: Clinical Measurements: Goal: Ability to maintain clinical measurements within normal limits will improve Outcome: Progressing Goal: Will remain free from infection Outcome: Progressing Goal: Diagnostic test results will improve Outcome: Progressing Goal: Respiratory complications will improve Outcome: Progressing Goal: Cardiovascular complication will be avoided Outcome: Progressing   Problem: Activity: Goal: Risk for activity intolerance will decrease Outcome: Progressing   Problem: Nutrition: Goal: Adequate nutrition will be maintained Outcome: Progressing   Problem: Coping: Goal: Level of anxiety will decrease Outcome: Progressing   Problem: Elimination: Goal: Will not experience complications related to bowel motility Outcome: Progressing Goal: Will not experience complications related to urinary retention Outcome: Progressing   Problem: Pain Managment: Goal: General experience of comfort will improve Outcome: Progressing   Problem: Safety: Goal: Ability to remain free from injury will improve Outcome: Progressing   Problem: Skin Integrity: Goal: Risk for impaired skin integrity will decrease Outcome: Progressing   Problem: Education: Goal: Knowledge of disease or condition will improve Outcome: Progressing Goal: Knowledge of the prescribed therapeutic regimen will improve Outcome: Progressing   Problem: Activity: Goal: Risk for activity intolerance will decrease Outcome: Progressing   Problem: Cardiac: Goal: Will achieve and/or maintain hemodynamic stability Outcome: Progressing   Problem: Clinical Measurements: Goal: Postoperative complications will be avoided or minimized Outcome: Progressing   Problem: Respiratory: Goal:  Respiratory status will improve Outcome: Progressing

## 2022-11-17 NOTE — Op Note (Signed)
      [address]       [name] [address]             [phone]        11/17/2022  Patient:  Zoe [name] Pre-Op Dx: Right upper lobe NSCLC   Post-op Dx:  same Procedure: - Robotic assisted right video thoracoscopy - right upper lobe wedge resection - Mediastinal lymph node sampling - Intercostal nerve block  Surgeon and Role:      * Jalaysha Skilton, Eliezer Lofts, MD - Primary  Assistant: Doree Fudge, PA-C  An experienced assistant was required given the complexity of this surgery and the standard of surgical care. The assistant was needed for exposure, dissection, suctioning, retraction of delicate tissues and sutures, instrument exchange and for overall help during this procedure.    Anesthesia  general EBL:  50 ml Blood Administration: noe Specimen:  right upper lobe wedge resection, level 7 and 9 lymph nodes  Drains: 28 F argyle chest tube in right chest Counts: correct   Indications: 87yo female with right upper NCSLC, and positive N1 lymph node.  She also has a hx of atrial fibrillation, and multiple compression fractures.  We discussed several treatment options, and she is still leaning towards surgery.  We discussed the risks and benefits of R RATS, RULectomy.  She is agreeable to proceed.    Findings: Enlarge level 4 lymph node.  This was densely adherent to the azygous vein, pulmonary artery and pulmonary vein.  Given that this likely was positive for metastasis, and due to concern for significant bleeding, I elected to just perform a wedge resection of the upper lobe.    Operative Technique: After the risks, benefits and alternatives were thoroughly discussed, the patient was brought to the operative theatre.  Anesthesia was induced, and the patient was then placed in a lateral decubitus position and was prepped and draped in normal sterile fashion.  An appropriate surgical pause was performed, and pre-operative antibiotics were dosed  accordingly.  We began by placing our 4 robotic ports in the the 7th intercostal space targeting the hilum of the lung.  A 12mm assistant port was placed in the 9th intercostal space in the anterior axillary line.  The robot was then docked and all instruments were passed under direct visualization.    The lung was then retracted superiorly, and the inferior pulmonary ligament was divided.  The hilum was mobilized posteriorly.  While mobilizing the hilum off of the azygous vein, I encountered a large firm lymph node that was plastered to the pulmonary artery and upper portion of the vein.  Given that she previously had evidence of N1 disease, I was concerned that this level 4 node was also positive.  I then performed a generous wedge resection to avoid injuring the pulmonary artery.  The specimen was passed into an endocatch bag.  It was removed from the anterior access site.    Lymph nodes were then sampled at level 7.  The chest was irrigated, and an air leak test was performed.  An intercostal nerve block was performed under direct visualization.  A 28 F chest tube was then placed, and we watch the remaining lobes re-expand.  The skin and soft tissue were closed with absorbable suture    The patient tolerated the procedure without any immediate complications, and was transferred to the PACU in stable condition.  Peola Joynt Keane Scrape

## 2022-11-17 NOTE — Brief Op Note (Signed)
11/17/2022  11:39 AM  PATIENT:  Zoe [name]  87 y.o. female  PRE-OPERATIVE DIAGNOSIS:  RIGHT UPPER LOBE LUNG CANCER  POST-OPERATIVE DIAGNOSIS:  RIGHT UPPER LOBE LUNG CANCER  PROCEDURE:   XI ROBOTIC ASSISTED THORACOSCOPY-RIGHT UPPER LOBE WEDGE  NODE DISSECTION (Right) INTERCOSTAL NERVE BLOCK (Right)  SURGEON:  Surgeons and Role:    * Lightfoot, Eliezer Lofts, MD - Primary  PHYSICIAN ASSISTANT: Aloha Gell PA-C  ASSISTANTS: none   ANESTHESIA:   local and general  EBL:  Minimal  BLOOD ADMINISTERED:none  DRAINS:  right pleural drain    LOCAL MEDICATIONS USED:  OTHER exparel  SPECIMEN:  Source of Specimen:  RUL wedge, lymph nodes  DISPOSITION OF SPECIMEN:  PATHOLOGY  COUNTS:  YES  DICTATION: .Dragon Dictation  PLAN OF CARE: Admit to inpatient   PATIENT DISPOSITION:  PACU - hemodynamically stable.   Delay start of Pharmacological VTE agent (>24hrs) due to surgical blood loss or risk of bleeding: no

## 2022-11-17 NOTE — Anesthesia Postprocedure Evaluation (Signed)
Anesthesia Post Note  Patient: Jashanti P Friede  Procedure(s) Performed: XI ROBOTIC ASSISTED THORACOSCOPY-RIGHT UPPER LOBE WEDGE (Right: Chest) NODE DISSECTION (Right: Chest) INTERCOSTAL NERVE BLOCK (Right: Chest)     Patient location during evaluation: PACU Anesthesia Type: General Level of consciousness: awake and alert Pain management: pain level controlled Vital Signs Assessment: post-procedure vital signs reviewed and stable Respiratory status: spontaneous breathing, nonlabored ventilation, respiratory function stable and patient connected to nasal cannula oxygen Cardiovascular status: blood pressure returned to baseline and stable Postop Assessment: no apparent nausea or vomiting Anesthetic complications: no  No notable events documented.  Last Vitals:  Vitals:   11/17/22 1315 11/17/22 1345  BP: 125/61 125/61  Pulse: 66 75  Resp: (!) 21 18  Temp: (!) 36.2 C   SpO2: 97% 97%    Last Pain:  Vitals:   11/17/22 1315  TempSrc:   PainSc: Asleep                 Ilanna Deihl,W. EDMOND

## 2022-11-17 NOTE — Discharge Instructions (Signed)
Robot-Assisted Thoracic Surgery, Care After The following information offers guidance on how to care for yourself after your procedure. Your health care provider may also give you more specific instructions. If you have problems or questions, contact your health care provider. What can I expect after the procedure? After the procedure, it is common to have: Some pain and aches in the area of your surgical incisions. Pain when breathing in (inhaling) and coughing. Tiredness (fatigue). Trouble sleeping. Constipation. Follow these instructions at home: Medicines Take over-the-counter and prescription medicines only as told by your health care provider. If you were prescribed an antibiotic medicine, take it as told by your health care provider. Do not stop taking the antibiotic even if you start to feel better. Talk with your health care provider about safe and effective ways to manage pain after your procedure. Pain management should fit your specific health needs. Take pain medicine before pain becomes severe. Relieving and controlling your pain will make breathing easier for you. Ask your health care provider if the medicine prescribed to you requires you to avoid driving or using machinery. Eating and drinking Follow instructions from your health care provider about eating or drinking restrictions. These will vary depending on what procedure you had. Your health care provider may recommend: A liquid diet or soft diet for the first few days. Meals that are smaller and more frequent. A diet of fruits, vegetables, whole grains, and low-fat proteins. Limiting foods that are high in fat and processed sugar, including fried or sweet foods. Incision care Follow instructions from your health care provider about how to take care of your incisions. Make sure you: Wash your hands with soap and water for at least 20 seconds before and after you change your bandage (dressing). If soap and water are not  available, use hand sanitizer. Change your dressing as told by your health care provider. Leave stitches (sutures), skin glue, or adhesive strips in place. These skin closures may need to stay in place for 2 weeks or longer. If adhesive strip edges start to loosen and curl up, you may trim the loose edges. Do not remove adhesive strips completely unless your health care provider tells you to do that. Check your incision area every day for signs of infection. Check for: Redness, swelling, or more pain. Fluid or blood. Warmth. Pus or a bad smell. Activity Return to your normal activities as told by your health care provider. Ask your health care provider what activities are safe for you. Ask your health care provider when it is safe for you to drive. Do not lift anything that is heavier than 10 lb (4.5 kg), or the limit that you are told, until your health care provider says that it is safe. Rest as told by your health care provider. Avoid sitting for a long time without moving. Get up to take short walks every 1-2 hours. This is important to improve blood flow and breathing. Ask for help if you feel weak or unsteady. Do exercises as told by your health care provider. Pneumonia prevention  Do deep breathing exercises and cough regularly as directed. This helps clear mucus and opens your lungs. Doing this helps prevent lung infection (pneumonia). If you were given an incentive spirometer, use it as told. An incentive spirometer is a tool that measures how well you are filling your lungs with each breath. Coughing may hurt less if you try to support your chest. This is called splinting. Try one of these when you  cough: Hold a pillow against your chest. Place the palms of both hands on top of your incision area. Do not use any products that contain nicotine or tobacco. These products include cigarettes, chewing tobacco, and vaping devices, such as e-cigarettes. If you need help quitting, ask your  health care provider. Avoid secondhand smoke. General instructions If you have a drainage tube: Follow instructions from your health care provider about how to take care of it. Do not travel by airplane after your tube is removed until your health care provider tells you it is safe. You may need to take these actions to prevent or treat constipation: Drink enough fluid to keep your urine pale yellow. Take over-the-counter or prescription medicines. Eat foods that are high in fiber, such as beans, whole grains, and fresh fruits and vegetables. Limit foods that are high in fat and processed sugars, such as fried or sweet foods. Keep all follow-up visits. This is important. Contact a health care provider if: You have redness, swelling, or more pain around an incision. You have fluid or blood coming from an incision. An incision feels warm to the touch. You have pus or a bad smell coming from an incision. You have a fever. You cannot eat or drink without vomiting. Your pain medicine is not controlling your pain. Get help right away if: You have chest pain. Your heart is beating quickly. You have trouble breathing. You have trouble speaking. You are confused. You feel weak or dizzy, or you faint. These symptoms may represent a serious problem that is an emergency. Do not wait to see if the symptoms will go away. Get medical help right away. Call your local emergency services (911 in the [place] Do not drive yourself to the hospital. Summary Talk with your health care provider about safe and effective ways to manage pain after your procedure. Pain management should fit your specific health needs. Return to your normal activities as told by your health care provider. Ask your health care provider what activities are safe for you. Do deep breathing exercises and cough regularly as directed. This helps to clear mucus and prevent pneumonia. If it hurts to cough, ease pain by holding a pillow  against your chest or by placing the palms of both hands over your incisions. This information is not intended to replace advice given to you by your health care provider. Make sure you discuss any questions you have with your health care provider. Document Revised: 10/06/2019 Document Reviewed: 10/07/2019 Elsevier Patient Education  2024 [employer].  Discharge Instructions:  1. You may shower, please wash incisions daily with soap and water and keep dry.  If you wish to cover wounds with dressing you may do so but please keep clean and change daily.  No tub baths or swimming until incisions have completely healed.  If your incisions become red or develop any drainage please call our office at [phone]  2. No Driving until cleared by Dr. Lucilla Lame office and you are no longer using narcotic pain medications  3. Fever of 101.5 for at least 24 hours with no source, please contact our office at [phone]  4. Activity- up as tolerated, please walk at least 3 times per day.  Avoid strenuous activity, no lifting, pushing, or pulling with your arms over 8-10 lbs for a minimum of 6 weeks  5. If any questions or concerns arise, please do not hesitate to contact our office at [phone]

## 2022-11-18 ENCOUNTER — Encounter (HOSPITAL_COMMUNITY): Payer: Self-pay | Admitting: Thoracic Surgery (Cardiothoracic Vascular Surgery)

## 2022-11-18 ENCOUNTER — Inpatient Hospital Stay (HOSPITAL_COMMUNITY): Payer: HMO

## 2022-11-18 LAB — BASIC METABOLIC PANEL
Anion gap: 11 (ref 5–15)
BUN: 16 mg/dL (ref 8–23)
CO2: 27 mmol/L (ref 22–32)
Calcium: 9 mg/dL (ref 8.9–10.3)
Chloride: 102 mmol/L (ref 98–111)
Creatinine, Ser: 0.81 mg/dL (ref 0.44–1.00)
GFR, Estimated: >60 mL/min (ref >60)
Glucose, Bld: 110 mg/dL — ABNORMAL HIGH (ref 70–99)
Potassium: 4.5 mmol/L (ref 3.5–5.1)
Sodium: 140 mmol/L (ref 135–145)

## 2022-11-18 LAB — CBC
HCT: 30.1 % — ABNORMAL LOW (ref 36.0–46.0)
Hemoglobin: 9.7 g/dL — ABNORMAL LOW (ref 12.0–15.0)
MCH: 32.1 pg (ref 26.0–34.0)
MCHC: 32.2 g/dL (ref 30.0–36.0)
MCV: 99.7 fL (ref 80.0–100.0)
Platelets: 190 10*3/uL (ref 150–400)
RBC: 3.02 MIL/uL — ABNORMAL LOW (ref 3.87–5.11)
RDW: 13.9 % (ref 11.5–15.5)
WBC: 8.1 10*3/uL (ref 4.0–10.5)
nRBC: 0 % (ref 0.0–0.2)

## 2022-11-18 MED ORDER — LUBIPROSTONE 24 MCG PO CAPS
24.0000 ug | ORAL_CAPSULE | Freq: Two times a day (BID) | ORAL | Status: DC
  Administered 2022-11-18 – 2022-11-19 (×3): 24 ug via ORAL
  Filled 2022-11-18 (×3): qty 1

## 2022-11-18 MED ORDER — LACTULOSE 10 GM/15ML PO SOLN
20.0000 g | Freq: Two times a day (BID) | ORAL | Status: DC
  Administered 2022-11-18 – 2022-11-19 (×3): 20 g via ORAL
  Filled 2022-11-18 (×3): qty 30

## 2022-11-18 NOTE — Progress Notes (Signed)
Removed chest tube as per order, with no complications. Chest xray ordered. Will continue to monitor patient.

## 2022-11-18 NOTE — Progress Notes (Addendum)
      [address]       Zoe [name] [address]             [phone]      1 Day Post-Op Procedure(s) (LRB): XI ROBOTIC ASSISTED THORACOSCOPY-RIGHT UPPER LOBE WEDGE (Right) NODE DISSECTION (Right) INTERCOSTAL NERVE BLOCK (Right) Subjective: Patient states she has some pain with deep breaths but otherwise has no complaints. She denies shortness of breath but states they put the oxygen on her this AM.   Objective: Vital signs in last 24 hours: Temp:  [97.1 F (36.2 C)-98.1 F (36.7 C)] 97.5 F (36.4 C) (10/22 0339) Pulse Rate:  [63-76] 65 (10/22 0339) Cardiac Rhythm: Normal sinus rhythm (10/21 1928) Resp:  [10-21] 16 (10/22 0339) BP: (100-150)/(53-79) 137/69 (10/22 0339) SpO2:  [91 %-99 %] 99 % (10/22 0339) Weight:  [76.6 kg-77.1 kg] 76.6 kg (10/21 1528)  Hemodynamic parameters for last 24 hours:    Intake/Output from previous day: 10/21 0701 - 10/22 0700 In: 1000 [I.V.:800; IV Piggyback:200] Out: 985 [Urine:950; Blood:25; Chest Tube:10] Intake/Output this shift: No intake/output data recorded.  General appearance: alert, cooperative, and no distress Neurologic: intact Heart: regular rate and rhythm, S1, S2 normal, no murmur, click, rub or gallop Lungs: Coarse lung sounds, diminished bibasilar Abdomen: soft, non-tender; bowel sounds normal; no masses,  no organomegaly Extremities: SCDs in place Wound: Clean and dry dressing in place  Lab Results: Recent Labs    11/18/22 0350  WBC 8.1  HGB 9.7*  HCT 30.1*  PLT 190   BMET:  Recent Labs    11/18/22 0350  NA 140  K 4.5  CL 102  CO2 27  GLUCOSE 110*  BUN 16  CREATININE 0.81  CALCIUM 9.0    PT/INR: No results for input(s): "LABPROT", "INR" in the last 72 hours. ABG No results found for: "PHART", "HCO3", "TCO2", "ACIDBASEDEF", "O2SAT" CBG (last 3)  No results for input(s): "GLUCAP" in the last 72 hours.  Assessment/Plan: S/P Procedure(s) (LRB): XI ROBOTIC ASSISTED THORACOSCOPY-RIGHT  UPPER LOBE WEDGE (Right) NODE DISSECTION (Right) INTERCOSTAL NERVE BLOCK (Right)  Neuro: Pain controlled this AM  CV: Stable vital signs. BP controlled on home Lopressor 25mg  daily at bedtime.  Pulm: Saturating well on 2L [place]. CT output 10cc/24hrs. Tidaling but no clear air leak with cough this AM. CXR read pending but no pneumothorax seen. Continue nebs. Encourage IS and ambulation  GI: Tolerating a diet. Hx of IBS/chronic constipation. On Amitiza at home, will restart  Endo: No hx of DM. Hx of gout, on allopurinol.  Renal: Cr 0.81. Ok UO  Expected postop ABLA: H/H 9.7/30.1, not clinically significant at this time  Depression/anxiety: On home Doxepin and Xanax PRN  DVT Prophylaxis: Lovenox  Dispo: Likely clamping trial today with follow up CXR at noon.    LOS: 1 day    Jenny Reichmann, PA-C 11/18/2022

## 2022-11-18 NOTE — Discharge Summary (Signed)
Physician Discharge Summary  Patient ID: Zoe [name] MRN: [id number] DOB/AGE: [birth year] 87 y.o.  Admit date: 11/17/2022 Discharge date: 11/19/2022  Admission Diagnoses:  Discharge Diagnoses:  Principal Problem:   Status post partial lobectomy of lung Active Problems:   S/P partial lobectomy of lung   Discharged Condition: stable  History of Present Illness:    Zoe [name] 87 y.o. female presents for surgical evaluation of a 2.3cm right upper lobe lung cancer with evidence of N1 disease.  She quit smoking about 64yrs,ago.  She was enrolled in the lung cancer screening program.  She has a hx of multiple compression fractures, and spinal surgery, and currently walks with a cane.  She states that she is very mobile, and active.  Dr. Cliffton Asters reviewed the patient's diagnostic studies and determined she would benefit from surgical intervention. He reviewed the treatment options as well as the risks and benefits with the patient. Zoe [name] was agreeable to proceed with surgery.   Hospital Course: Zoe [name] presented to [facility] and was brought to the operating room on 11/17/22. She underwent Xi robotic assisted right upper lobe wedge resection, lymph node dissection and intercostal nerve block. She tolerated the procedure well, was extubated and transferred to the PACU in stable condition. CXR was without a pneumothorax. Chest tube was without an air leak on POD1. Chest tube was removed without complication, CXR remained stable. Oxygen was weaned and she began ambulating well on RA. Her incisions were healing well without sign of infection. The patient began moving her bowels. She was felt stable for discharge.   Consults: None  Significant Diagnostic Studies:  CLINICAL DATA:  Initial treatment strategy for right upper lobe pulmonary nodule on CT, highly concerning for primary lung cancer.   EXAM: NUCLEAR MEDICINE PET SKULL BASE TO THIGH   TECHNIQUE: 8.3  mCi F-18 FDG was injected intravenously. Full-ring PET imaging was performed from the skull base to thigh after the radiotracer. CT data was obtained and used for attenuation correction and anatomic localization.   Fasting blood glucose: 120 mg/dl   COMPARISON:  Chest CT 07/25/2022.  Abdominal CT 02/21/2021.   FINDINGS: Mediastinal blood pool activity: SUV max 2.1   NECK:   No hypermetabolic cervical lymph nodes are identified.Nonspecific mildly asymmetric activity within the left palatine tonsil (SUV max 3.8), without corresponding suspicious finding on the CT images, likely physiologic/reactive. No suspicious activity identified within the pharyngeal mucosal space.   Incidental CT findings: Bilateral carotid atherosclerosis.   CHEST:   Single small hypermetabolic right hilar lymph node (SUV max 7.9). No other hypermetabolic mediastinal, hilar or axillary lymph nodes. The spiculated nodule inferiorly in the right upper lobe demonstrates intense hypermetabolic activity (SUV max 11.8), consistent with bronchogenic carcinoma. This nodule is similar in size to the recent CT, measuring 2.3 x 2.3 cm on image 42/7. No other hypermetabolic pulmonary activity or suspicious nodularity.   Incidental CT findings: Mild centrilobular and paraseptal emphysema. There is a calcified granuloma within the lingula. Atherosclerosis of the aorta, great vessels and coronary arteries. Thin pericardial calcifications are again noted, without significant pericardial or pleural effusion.   ABDOMEN/PELVIS:   There is no hypermetabolic activity within the liver, adrenal glands, spleen or pancreas. There is no hypermetabolic nodal activity in the abdomen or pelvis. Bowel activity within physiologic limits.   Incidental CT findings: Aortic and branch vessel atherosclerosis.   SKELETON:   There is no hypermetabolic activity to suggest osseous metastatic disease.   Incidental CT findings:  Low-level activity associated with previous multilevel spinal augmentation in the thoracolumbar spine. Multiple chronic compression deformities are grossly stable. Previous bilateral total hip arthroplasty. Chronic subcutaneous calcifications within the back and pelvis.   IMPRESSION: 1. The spiculated right upper lobe pulmonary nodule demonstrates intense hypermetabolic activity, consistent with bronchogenic carcinoma. 2. Single hypermetabolic right hilar lymph node consistent with nodal metastasis. 3. No evidence of distant metastatic disease. 4. Nonspecific mildly asymmetric activity within the left palatine tonsil without corresponding suspicious finding on the CT images, likely physiologic/reactive. 5. Multiple chronic compression deformities in the thoracolumbar spine with associated low-level activity. 6. Aortic Atherosclerosis (ICD10-I70.0) and Emphysema (ICD10-J43.9).     Electronically Signed   By: Carey Bullocks M.D.   On: 08/24/2022 11:35   Treatments: surgery: 11/17/2022   Patient:  Zoe [name] Pre-Op Dx: Right upper lobe NSCLC   Post-op Dx:  same Procedure: - Robotic assisted right video thoracoscopy - right upper lobe wedge resection - Mediastinal lymph node sampling - Intercostal nerve block   Surgeon and Role:      * Lightfoot, Eliezer Lofts, MD - Primary  PATHOLOGY: Pending  Discharge Exam: Blood pressure (!) 103/59, pulse 82, temperature 98 F (36.7 C), temperature source Oral, resp. rate 18, height 5\' 4"  (1.626 m), weight 76.6 kg, SpO2 92%. General appearance: alert, cooperative, and no distress Neurologic: intact Heart: regular rate and rhythm, S1, S2 normal, no murmur, click, rub or gallop Lungs: clear to auscultation bilaterally Abdomen: soft, non-tender; bowel sounds normal; no masses,  no organomegaly Extremities: extremities normal, atraumatic, no cyanosis or edema Wound: Clean and dry incisions without sign of infection  Disposition:  Discharge disposition: 01-Home or Self Care        Allergies as of 11/19/2022       Reactions   Codeine Other (See Comments)   Pains in abdominal area   Other    NOVACAINE     ITCHING Anti-depressants   Oxycodone    Abdominal pain, chest pain   Paxil [paroxetine Hcl] Itching   Procaine Itching        Medication List     TAKE these medications    acetaminophen 500 MG tablet Commonly known as: TYLENOL Take 500 mg by mouth every 6 (six) hours as needed for moderate pain. 3 -5 per day   albuterol 108 (90 Base) MCG/ACT inhaler Commonly known as: VENTOLIN HFA Inhale 2 puffs into the lungs every 6 (six) hours as needed for wheezing or shortness of breath.   allopurinol 300 MG tablet Commonly known as: ZYLOPRIM Take 1 tablet (300 mg total) by mouth daily.   ALPRAZolam 0.5 MG tablet Commonly known as: XANAX Take 0.5 mg by mouth 2 (two) times daily as needed for anxiety.   apixaban 5 MG Tabs tablet Commonly known as: Eliquis Take 1 tablet (5 mg total) by mouth 2 (two) times daily.   APPLE CIDER VINEGAR PO Take 625 mg by mouth daily.   Biotin 5000 MCG Tabs Take 5,000-10,000 mcg by mouth daily.   calcium-vitamin D 250-100 MG-UNIT tablet Take 1 tablet by mouth daily.   CRANBERRY CONCENTRATE/VITAMINC PO Take 250 mg by mouth daily.   cyanocobalamin 2000 MCG tablet Take 5,000 mcg by mouth daily.   diclofenac Sodium 1 % Gel Commonly known as: VOLTAREN Apply 2-4 g topically 4 (four) times daily. APPLY 2-4 GRAMS TOPICALLY FOUR TIMES DAILY Strength: 1 %   docusate sodium 100 MG capsule Commonly known as: COLACE Take 100 mg by mouth daily.  dorzolamide 2 % ophthalmic solution Commonly known as: TRUSOPT Place 1 drop into the right eye daily.   doxepin 100 MG capsule Commonly known as: SINEQUAN Take 1 capsule (100 mg total) by mouth at bedtime.   gabapentin 300 MG capsule Commonly known as: NEURONTIN Take 300 mg by mouth 4 (four) times daily as needed  (pain).   levocetirizine 5 MG tablet Commonly known as: XYZAL Take 5 mg by mouth every evening.   lubiprostone 24 MCG capsule Commonly known as: Amitiza Take 1 capsule (24 mcg total) by mouth 2 (two) times daily with a meal.   magnesium oxide 400 (240 Mg) MG tablet Commonly known as: MAG-OX Take 400 mg by mouth every Monday, Wednesday, and Friday.   meloxicam 15 MG tablet Commonly known as: MOBIC Take 15 mg by mouth daily.   metoprolol tartrate 25 MG tablet Commonly known as: LOPRESSOR Take 0.5 tablets (12.5 mg total) by mouth 2 (two) times daily. What changed:  how much to take when to take this   omeprazole 40 MG capsule Commonly known as: PRILOSEC Take 1 capsule (40 mg total) by mouth daily.   Potassium 99 MG Tabs Take 99 mg by mouth every Monday, Wednesday, and Friday.   pravastatin 20 MG tablet Commonly known as: PRAVACHOL Take 20 mg by mouth as directed. 3 x week   prochlorperazine 10 MG tablet Commonly known as: COMPAZINE Take 1 tablet (10 mg total) by mouth every 6 (six) hours as needed for nausea or vomiting.   senna 8.6 MG tablet Commonly known as: SENOKOT Take 5 tablets by mouth 2 (two) times daily.   traMADol 50 MG tablet Commonly known as: ULTRAM Take 1 tablet (50 mg total) by mouth every 6 (six) hours as needed for moderate pain (pain score 4-6) (mild pain).   Travoprost (BAK Free) 0.004 % Soln ophthalmic solution Commonly known as: TRAVATAN Place 1 drop into both eyes at bedtime.   traZODone 50 MG tablet Commonly known as: DESYREL Take 50-100 mg by mouth at bedtime.   Trelegy Ellipta 100-62.5-25 MCG/ACT Aepb Generic drug: Fluticasone-Umeclidin-Vilant Inhale 1 puff into the lungs daily.   Vitamin D-3 125 MCG (5000 UT) Tabs Take 5,000 Units by mouth daily.        Follow-up Information     Corliss Skains, MD Follow up on 11/26/2022.   Specialty: Cardiothoracic Surgery Why: Follow up appointment is at 1:20PM Contact  information: [address] [place] [address] [phone]                 Signed: Jenny Reichmann, PA-C  11/19/2022, 12:29 PM

## 2022-11-18 NOTE — Progress Notes (Signed)
Mobility Specialist Progress Note:   11/18/22 1600  Mobility  Activity Ambulated with assistance in hallway  Level of Assistance Contact guard assist, steadying assist  Assistive Device Front wheel walker  Distance Ambulated (ft) 100 ft  Activity Response Tolerated well  Mobility Referral Yes  $Mobility charge 1 Mobility  Mobility Specialist Start Time (ACUTE ONLY) 1546  Mobility Specialist Stop Time (ACUTE ONLY) 1600  Mobility Specialist Time Calculation (min) (ACUTE ONLY) 14 min    Received pt in bed having no complaints and agreeable to mobility. Pt was asymptomatic throughout ambulation and returned to room w/o fault. Left in bed w/ call bell in reach and all needs met.   [place]'Vante Earlene Plater Mobility Specialist Please contact via Special educational needs teacher or Rehab office at [phone]

## 2022-11-19 ENCOUNTER — Inpatient Hospital Stay (HOSPITAL_COMMUNITY): Payer: HMO

## 2022-11-19 LAB — CBC
HCT: 32.8 % — ABNORMAL LOW (ref 36.0–46.0)
Hemoglobin: 10.4 g/dL — ABNORMAL LOW (ref 12.0–15.0)
MCH: 31.8 pg (ref 26.0–34.0)
MCHC: 31.7 g/dL (ref 30.0–36.0)
MCV: 100.3 fL — ABNORMAL HIGH (ref 80.0–100.0)
Platelets: 192 10*3/uL (ref 150–400)
RBC: 3.27 MIL/uL — ABNORMAL LOW (ref 3.87–5.11)
RDW: 14.3 % (ref 11.5–15.5)
WBC: 7.4 10*3/uL (ref 4.0–10.5)
nRBC: 0 % (ref 0.0–0.2)

## 2022-11-19 LAB — SURGICAL PATHOLOGY

## 2022-11-19 LAB — BASIC METABOLIC PANEL
Anion gap: 9 (ref 5–15)
BUN: 19 mg/dL (ref 8–23)
CO2: 26 mmol/L (ref 22–32)
Calcium: 8.7 mg/dL — ABNORMAL LOW (ref 8.9–10.3)
Chloride: 104 mmol/L (ref 98–111)
Creatinine, Ser: 0.76 mg/dL (ref 0.44–1.00)
GFR, Estimated: >60 mL/min (ref >60)
Glucose, Bld: 116 mg/dL — ABNORMAL HIGH (ref 70–99)
Potassium: 4.4 mmol/L (ref 3.5–5.1)
Sodium: 139 mmol/L (ref 135–145)

## 2022-11-19 MED ORDER — TRAMADOL HCL 50 MG PO TABS: 50.0000 mg | ORAL_TABLET | Freq: Four times a day (QID) | ORAL | 0 refills | Status: DC | PRN

## 2022-11-19 MED ORDER — SIMETHICONE 80 MG PO CHEW
80.0000 mg | CHEWABLE_TABLET | Freq: Four times a day (QID) | ORAL | Status: DC | PRN
  Administered 2022-11-19: 80 mg via ORAL
  Filled 2022-11-19: qty 1

## 2022-11-19 NOTE — TOC Transition Note (Signed)
Transition of Care [id number]) - CM/SW Discharge Note   Patient Details  Name: Zoe [name] MRN: [id number] Date of Birth: 05-24-1935  Transition of [facility]) CM/SW Contact:  Leone Haven, RN Phone Number: 11/19/2022, 11:15 AM   Clinical Narrative:    Dc home today, no needs.   Final next level of care: Home/Self Care Barriers to Discharge: No Barriers Identified   Patient Goals and CMS Choice      Discharge Placement                         Discharge Plan and Services Additional resources added to the After Visit Summary for   In-house Referral: NA Discharge Planning Services: CM Consult Post Acute Care Choice: NA            DME Agency: NA       HH Arranged: NA HH Agency: NA        Social Determinants of Health (SDOH) Interventions SDOH Screenings   Food Insecurity: No Food Insecurity (11/17/2022)  Recent Concern: Food Insecurity - Food Insecurity Present (10/01/2022)   Received from [facility]  Housing: Low Risk  (11/17/2022)  Transportation Needs: No Transportation Needs (11/17/2022)  Utilities: Not At Risk (11/17/2022)  Alcohol Screen: Low Risk  (07/10/2020)  Depression (PHQ2-9): Low Risk  (09/16/2022)  Financial Resource Strain: High Risk (10/01/2022)   Received from [facility]  Physical Activity: Inactive (10/01/2022)   Received from [facility]  Social Connections: Moderately Isolated (10/01/2022)   Received from [facility]  Stress: Stress Concern Present (10/01/2022)   Received from [facility]  Tobacco Use: Medium Risk (11/17/2022)  Health Literacy: Low Risk  (10/01/2022)   Received from [facility]     Readmission Risk Interventions     No data to display

## 2022-11-19 NOTE — TOC Initial Note (Signed)
Transition of Care [facility]) - Initial/Assessment Note    Patient Details  Name: Zoe [name] MRN: [id number] Date of Birth: 1935/11/26  Transition [facility]) CM/SW Contact:    Leone Haven, RN Phone Number: 11/19/2022, 11:14 AM  Clinical Narrative:                 From home with spouse, has PCP and insurance on file, transported home by family member, has no needs.  Expected Discharge Plan: Home/Self Care Barriers to Discharge: No Barriers Identified   Patient Goals and CMS Choice Patient states their goals for this hospitalization and ongoing recovery are:: home          Expected Discharge Plan and Services In-house Referral: NA Discharge Planning Services: CM Consult Post Acute Care Choice: NA Living arrangements for the past 2 months: Single Family Home Expected Discharge Date: 11/19/22                 DME Agency: NA       HH Arranged: NA HH Agency: NA        Prior Living Arrangements/Services Living arrangements for the past 2 months: Single Family Home Lives with:: Spouse Patient language and need for interpreter reviewed:: Yes        Need for Family Participation in Patient Care: Yes (Comment) Care giver support system in place?: Yes (comment)   Criminal Activity/Legal Involvement Pertinent to Current Situation/Hospitalization: No - Comment as needed  Activities of Daily Living   ADL Screening (condition at time of admission) Independently performs ADLs?: Yes (appropriate for developmental age) Is the patient deaf or have difficulty hearing?: No Does the patient have difficulty seeing, even when wearing glasses/contacts?: No Does the patient have difficulty concentrating, remembering, or making decisions?: No  Permission Sought/Granted                  Emotional Assessment       Orientation: : Oriented to Self, Oriented to Place, Oriented to  Time, Oriented to Situation Alcohol / Substance Use: Not Applicable Psych  Involvement: No (comment)  Admission diagnosis:  Status post partial lobectomy of lung [Z90.2] S/P partial lobectomy of lung [Z90.2] Patient Active Problem List   Diagnosis Date Noted   Status post partial lobectomy of lung 11/17/2022   S/P partial lobectomy of lung 11/17/2022   Paroxysmal atrial fibrillation (HCC) 10/02/2022   Preop cardiovascular exam 10/02/2022   Hoarseness 09/18/2022   Globus sensation 09/18/2022   Malignant neoplasm of upper lobe of right lung (HCC) 09/15/2022   Lung nodule 08/25/2022   Solitary pulmonary nodule 06/22/2022   Aspiration into airway 11/28/2021   IBS (irritable bowel syndrome) 11/28/2021   SSBE (short-segment Barrett's esophagus) 06/25/2021   Lip pain 11/01/2020   Claudication (HCC) 08/02/2020   Hearing loss, right 07/16/2020   Seasonal allergies 07/04/2020   Insomnia 07/04/2020   Osteoporosis 07/04/2020   Lumbosacral spondylosis without myelopathy 06/01/2020   Polyarthritis 04/03/2020   Nausea 04/03/2020   Leg swelling 01/31/2020   Chronic diastolic CHF (congestive heart failure) (HCC) 01/31/2020   Abdominal pain 01/10/2020   Dysphagia 06/21/2019   Stenosis of lateral recess of lumbar spine 12/06/2018   Foraminal stenosis of lumbar region 12/06/2018   Spondylosis without myelopathy or radiculopathy, lumbar region 12/06/2018   Other idiopathic scoliosis, thoracolumbar region 12/06/2018   Chronic left shoulder pain 12/06/2018   History of colonic polyps 10/22/2016   SBO (small bowel obstruction) (HCC) 03/02/2013   Hyperlipidemia 12/08/2012   Gout 12/08/2012  Dysrhythmia 12/08/2012   Depression 12/08/2012   Asthmatic bronchitis 12/08/2012   Fibromyalgia 12/08/2012   Constipation 12/08/2012   Acute posthemorrhagic anemia 12/07/2012   Benign essential hypertension 12/07/2012   Degenerative arthritis of hip, left 11/05/2012   Weakness of right leg 03/24/2011   OA (osteoarthritis) 03/24/2011   Stiffness of cervical spine 03/24/2011    GERD (gastroesophageal reflux disease) 11/19/2010   Spinal stenosis 04/18/2009   HIP PAIN 11/17/2007   DDD (degenerative disc disease), lumbosacral 11/17/2007   ANSERINE BURSITIS, RIGHT 08/11/2007   Sciatica 06/22/2007   Knee pain 03/17/2007   PCP:  Ignatius Specking, MD Pharmacy:   [facility] [address], [place] - [address] [place] [address] Phone: [phone] Fax: [phone]  [facility] - [place]ottie Mussel, [place] - [address] [place] [address] Phone: [phone] Fax: [phone]     Social Determinants of Health (SDOH) Social History: SDOH Screenings   Food Insecurity: No Food Insecurity (11/17/2022)  Recent Concern: Food Insecurity - Food Insecurity Present (10/01/2022)   Received from [facility]  Housing: Low Risk  (11/17/2022)  Transportation Needs: No Transportation Needs (11/17/2022)  Utilities: Not At Risk (11/17/2022)  Alcohol Screen: Low Risk  (07/10/2020)  Depression (PHQ2-9): Low Risk  (09/16/2022)  Financial Resource Strain: High Risk (10/01/2022)   Received from [facility]  Physical Activity: Inactive (10/01/2022)   Received from [facility]  Social Connections: Moderately Isolated (10/01/2022)   Received from [facility]  Stress: Stress Concern Present (10/01/2022)   [facility] from [facility]  Tobacco Use: Medium Risk (11/17/2022)  Health Literacy: Low Risk  (10/01/2022)   Received from [facility]   SDOH Interventions:     Readmission Risk Interventions     No data to display

## 2022-11-19 NOTE — Progress Notes (Signed)
      [address]       [place] [address]             [phone]      2 Days Post-Op Procedure(s) (LRB): XI ROBOTIC ASSISTED THORACOSCOPY-RIGHT UPPER LOBE WEDGE (Right) NODE DISSECTION (Right) INTERCOSTAL NERVE BLOCK (Right) Subjective: The patient states she feels much better today. Ambulated a few times today.   Objective: Vital signs in last 24 hours: Temp:  [97.6 F (36.4 C)-98.3 F (36.8 C)] 98.1 F (36.7 C) (10/23 0401) Pulse Rate:  [73-81] 81 (10/23 0401) Cardiac Rhythm: Normal sinus rhythm (10/22 1908) Resp:  [15-19] 16 (10/23 0401) BP: (96-127)/(55-76) 127/76 (10/23 0401) SpO2:  [90 %-97 %] 96 % (10/23 0401)  Hemodynamic parameters for last 24 hours:    Intake/Output from previous day: 10/22 0701 - 10/23 0700 In: 360 [P.O.:360] Out: 200 [Urine:200] Intake/Output this shift: No intake/output data recorded.  General appearance: alert, cooperative, and no distress Neurologic: intact Heart: regular rate and rhythm, S1, S2 normal, no murmur, click, rub or gallop Lungs: clear to auscultation bilaterally Abdomen: soft, non-tender; bowel sounds normal; no masses,  no organomegaly Extremities: extremities normal, atraumatic, no cyanosis or edema Wound: Clean and dry incisions without sign of infection  Lab Results: Recent Labs    11/18/22 0350 11/19/22 0328  WBC 8.1 7.4  HGB 9.7* 10.4*  HCT 30.1* 32.8*  PLT 190 192   BMET:  Recent Labs    11/18/22 0350 11/19/22 0328  NA 140 139  K 4.5 4.4  CL 102 104  CO2 27 26  GLUCOSE 110* 116*  BUN 16 19  CREATININE 0.81 0.76  CALCIUM 9.0 8.7*    PT/INR: No results for input(s): "LABPROT", "INR" in the last 72 hours. ABG No results found for: "PHART", "HCO3", "TCO2", "ACIDBASEDEF", "O2SAT" CBG (last 3)  No results for input(s): "GLUCAP" in the last 72 hours.  Assessment/Plan: S/P Procedure(s) (LRB): XI ROBOTIC ASSISTED THORACOSCOPY-RIGHT UPPER LOBE WEDGE (Right) NODE DISSECTION  (Right) INTERCOSTAL NERVE BLOCK (Right)  Neuro: Pain controlled this AM   CV: Stable vital signs. BP controlled on home Lopressor 25mg  daily at bedtime.   Pulm: Saturating well on RA.CT removed yesterday. CXR with possible small right apical pneumothorax pending final read. Continue nebs. Encourage IS and ambulation   GI: Tolerating a diet. Hx of IBS/chronic constipation. On home Amitiza and lactulose.    Endo: No hx of DM. Hx of gout, on allopurinol.   Renal: Cr 0.76. Ok UO   Expected postop ABLA: H/H 10.4/32.8, not clinically significant at this time   Depression/anxiety: On home Doxepin and Xanax PRN   DVT Prophylaxis: Lovenox   Dispo: Plan to d/c home today   LOS: 2 days    Jenny Reichmann, PA-C 11/19/2022

## 2022-11-19 NOTE — Progress Notes (Signed)
Went over discharge paperwork with patient and family. All questions answered. CVC line removed with no complications. All belongings at bedside.

## 2022-11-19 NOTE — Plan of Care (Signed)

## 2022-11-21 DIAGNOSIS — K5900 Constipation, unspecified: Secondary | ICD-10-CM | POA: Diagnosis not present

## 2022-11-21 DIAGNOSIS — Z85118 Personal history of other malignant neoplasm of bronchus and lung: Secondary | ICD-10-CM | POA: Diagnosis not present

## 2022-11-26 ENCOUNTER — Ambulatory Visit (INDEPENDENT_AMBULATORY_CARE_PROVIDER_SITE_OTHER): Payer: Self-pay | Admitting: Thoracic Surgery (Cardiothoracic Vascular Surgery)

## 2022-11-26 VITALS — BP 97/62 | HR 69 | Resp 20 | Ht 64.0 in | Wt 173.0 lb

## 2022-11-26 DIAGNOSIS — C3411 Malignant neoplasm of upper lobe, right bronchus or lung: Secondary | ICD-10-CM

## 2022-11-26 DIAGNOSIS — Z09 Encounter for follow-up examination after completed treatment for conditions other than malignant neoplasm: Secondary | ICD-10-CM

## 2022-11-27 ENCOUNTER — Other Ambulatory Visit: Payer: Self-pay

## 2022-11-27 DIAGNOSIS — K5900 Constipation, unspecified: Secondary | ICD-10-CM | POA: Diagnosis not present

## 2022-11-27 DIAGNOSIS — R109 Unspecified abdominal pain: Secondary | ICD-10-CM | POA: Diagnosis not present

## 2022-11-27 DIAGNOSIS — C3491 Malignant neoplasm of unspecified part of right bronchus or lung: Secondary | ICD-10-CM | POA: Diagnosis not present

## 2022-11-27 DIAGNOSIS — R110 Nausea: Secondary | ICD-10-CM | POA: Diagnosis not present

## 2022-11-27 DIAGNOSIS — C3490 Malignant neoplasm of unspecified part of unspecified bronchus or lung: Secondary | ICD-10-CM | POA: Diagnosis not present

## 2022-11-28 ENCOUNTER — Encounter: Payer: Self-pay | Admitting: Internal Medicine

## 2022-11-28 NOTE — Progress Notes (Signed)
The proposed treatment discussed in conference is for discussion purpose only and is not a binding recommendation.  The patients have not been physically examined, or presented with their treatment options.  Therefore, final treatment plans cannot be decided.  

## 2022-11-29 ENCOUNTER — Encounter (HOSPITAL_COMMUNITY): Payer: Self-pay

## 2022-11-29 ENCOUNTER — Encounter: Payer: Self-pay | Admitting: Internal Medicine

## 2022-11-29 ENCOUNTER — Other Ambulatory Visit: Payer: Self-pay

## 2022-11-29 ENCOUNTER — Emergency Department (HOSPITAL_COMMUNITY)
Admission: EM | Admit: 2022-11-29 | Discharge: 2022-11-29 | Disposition: A | Discharge status: Home / Self Care | Payer: HMO | Attending: Emergency Medicine | Admitting: Emergency Medicine

## 2022-11-29 DIAGNOSIS — K5900 Constipation, unspecified: Secondary | ICD-10-CM | POA: Diagnosis not present

## 2022-11-29 DIAGNOSIS — Z7901 Long term (current) use of anticoagulants: Secondary | ICD-10-CM | POA: Insufficient documentation

## 2022-11-29 LAB — CBC WITH DIFFERENTIAL/PLATELET
Abs Immature Granulocytes: 0.03 10*3/uL (ref 0.00–0.07)
Basophils Absolute: 0 10*3/uL (ref 0.0–0.1)
Basophils Relative: 0 %
Eosinophils Absolute: 0.3 10*3/uL (ref 0.0–0.5)
Eosinophils Relative: 5 %
HCT: 30.7 % — ABNORMAL LOW (ref 36.0–46.0)
Hemoglobin: 9.5 g/dL — ABNORMAL LOW (ref 12.0–15.0)
Immature Granulocytes: 0 %
Lymphocytes Relative: 27 %
Lymphs Abs: 2 10*3/uL (ref 0.7–4.0)
MCH: 31 pg (ref 26.0–34.0)
MCHC: 30.9 g/dL (ref 30.0–36.0)
MCV: 100.3 fL — ABNORMAL HIGH (ref 80.0–100.0)
Monocytes Absolute: 0.4 10*3/uL (ref 0.1–1.0)
Monocytes Relative: 6 %
Neutro Abs: 4.6 10*3/uL (ref 1.7–7.7)
Neutrophils Relative %: 62 %
Platelets: 288 10*3/uL (ref 150–400)
RBC: 3.06 MIL/uL — ABNORMAL LOW (ref 3.87–5.11)
RDW: 14.2 % (ref 11.5–15.5)
WBC: 7.5 10*3/uL (ref 4.0–10.5)
nRBC: 0 % (ref 0.0–0.2)

## 2022-11-29 LAB — COMPREHENSIVE METABOLIC PANEL
ALT: 8 U/L (ref 0–44)
AST: 13 U/L — ABNORMAL LOW (ref 15–41)
Albumin: 2.8 g/dL — ABNORMAL LOW (ref 3.5–5.0)
Alkaline Phosphatase: 77 U/L (ref 38–126)
Anion gap: 7 (ref 5–15)
BUN: 21 mg/dL (ref 8–23)
CO2: 27 mmol/L (ref 22–32)
Calcium: 8.5 mg/dL — ABNORMAL LOW (ref 8.9–10.3)
Chloride: 107 mmol/L (ref 98–111)
Creatinine, Ser: 0.74 mg/dL (ref 0.44–1.00)
GFR, Estimated: >60 mL/min (ref >60)
Glucose, Bld: 110 mg/dL — ABNORMAL HIGH (ref 70–99)
Potassium: 4.4 mmol/L (ref 3.5–5.1)
Sodium: 141 mmol/L (ref 135–145)
Total Bilirubin: 0.3 mg/dL (ref 0.3–1.2)
Total Protein: 5.8 g/dL — ABNORMAL LOW (ref 6.5–8.1)

## 2022-11-29 MED ORDER — MAGNESIUM HYDROXIDE 400 MG/5ML PO SUSP
960.0000 mL | Freq: Once | ORAL | Status: AC
  Administered 2022-11-29: 960 mL via RECTAL
  Filled 2022-11-29: qty 240

## 2022-11-29 MED ORDER — IBUPROFEN 800 MG PO TABS
800.0000 mg | ORAL_TABLET | Freq: Once | ORAL | Status: AC
  Administered 2022-11-29: 800 mg via ORAL
  Filled 2022-11-29: qty 1

## 2022-11-29 MED ORDER — MINERAL OIL RE ENEM: 1.0000 | ENEMA | Freq: Once | RECTAL | Status: DC

## 2022-11-29 NOTE — ED Provider Notes (Signed)
[place] EMERGENCY DEPARTMENT AT [facility] Provider Note   CSN: [id number] Arrival date & time: 11/29/22  1143     History  Chief Complaint  Patient presents with   Constipation    Zoe [name] is a 87 y.o. female who recently underwent lobectomy of the right lung for malignancy on 11/17/2022, who presents with concern for only having 1 bowel movement since her recent lobectomy.  She is still passing flatus.  Denies any nausea or vomiting.  Denies any fever or chills.  Reports pain when taking deep breaths in, but otherwise with no pain.   Constipation      Home Medications Prior to Admission medications   Medication Sig Start Date End Date Taking? Authorizing Provider  acetaminophen (TYLENOL) 500 MG tablet Take 500 mg by mouth every 6 (six) hours as needed for moderate pain. 3 -5 per day    [provider]  albuterol (PROVENTIL HFA;VENTOLIN HFA) 108 (90 BASE) MCG/ACT inhaler Inhale 2 puffs into the lungs every 6 (six) hours as needed for wheezing or shortness of breath.    [provider]  allopurinol (ZYLOPRIM) 300 MG tablet Take 1 tablet (300 mg total) by mouth daily. 10/02/20   Heather Roberts, NP  ALPRAZolam Prudy Feeler) 0.5 MG tablet Take 0.5 mg by mouth 2 (two) times daily as needed for anxiety.    [provider]  apixaban (ELIQUIS) 5 MG TABS tablet Take 1 tablet (5 mg total) by mouth 2 (two) times daily. 10/02/22   Mallipeddi, Vishnu P, MD  APPLE CIDER VINEGAR PO Take 625 mg by mouth daily.    [provider]  Biotin 5000 MCG TABS Take 5,000-10,000 mcg by mouth daily.    [provider]  calcium-vitamin D 250-100 MG-UNIT tablet Take 1 tablet by mouth daily.    [provider]  Cholecalciferol (VITAMIN D-3) 125 MCG (5000 UT) TABS Take 5,000 Units by mouth daily.    [provider]  Cranberry-Vitamin C (CRANBERRY CONCENTRATE/VITAMINC PO) Take 250 mg by mouth daily.    [provider]   cyanocobalamin 2000 MCG tablet Take 5,000 mcg by mouth daily.    [provider]  diclofenac Sodium (VOLTAREN) 1 % GEL Apply 2-4 g topically 4 (four) times daily. APPLY 2-4 GRAMS TOPICALLY FOUR TIMES DAILY Strength: 1 % 03/06/22   Kathryne Hitch, MD  docusate sodium (COLACE) 100 MG capsule Take 100 mg by mouth daily.    [provider]  dorzolamide (TRUSOPT) 2 % ophthalmic solution Place 1 drop into the right eye daily. 07/28/22   [provider]  doxepin (SINEQUAN) 100 MG capsule Take 1 capsule (100 mg total) by mouth at bedtime. 08/24/20   Heather Roberts, NP  Fluticasone-Umeclidin-Vilant (TRELEGY ELLIPTA) 100-62.5-25 MCG/ACT AEPB Inhale 1 puff into the lungs daily. 08/22/22   Nyoka Cowden, MD  gabapentin (NEURONTIN) 300 MG capsule Take 300 mg by mouth 4 (four) times daily as needed (pain).    [provider]  levocetirizine (XYZAL) 5 MG tablet Take 5 mg by mouth every evening.    [provider]  lubiprostone (AMITIZA) 24 MCG capsule Take 1 capsule (24 mcg total) by mouth 2 (two) times daily with a meal. 11/28/21   Marguerita Merles, Reuel Boom, MD  magnesium oxide (MAG-OX) 400 (240 Mg) MG tablet Take 400 mg by mouth every Monday, Wednesday, and Friday.    [provider]  meloxicam (MOBIC) 15 MG tablet Take 15 mg by mouth daily.  [provider]  metoprolol tartrate (LOPRESSOR) 25 MG tablet Take 0.5 tablets (12.5 mg total) by mouth 2 (two) times daily. Patient taking differently: Take 25 mg by mouth at bedtime. 10/02/22   Mallipeddi, Vishnu P, MD  omeprazole (PRILOSEC) 40 MG capsule Take 1 capsule (40 mg total) by mouth daily. 09/18/22   Dolores Frame, MD  Potassium 99 MG TABS Take 99 mg by mouth every Monday, Wednesday, and Friday.    [provider]  pravastatin (PRAVACHOL) 20 MG tablet Take 20 mg by mouth as directed. 3 x week    [provider]  prochlorperazine (COMPAZINE) 10 MG tablet Take 1  tablet (10 mg total) by mouth every 6 (six) hours as needed for nausea or vomiting. 09/17/22   Si Gaul, MD  senna (SENOKOT) 8.6 MG tablet Take 5 tablets by mouth 2 (two) times daily.    [provider]  traMADol (ULTRAM) 50 MG tablet Take 1 tablet (50 mg total) by mouth every 6 (six) hours as needed for moderate pain (pain score 4-6) (mild pain). 11/19/22   Stehler, Oren Bracket, PA-C  Travoprost, BAK Free, (TRAVATAN) 0.004 % SOLN ophthalmic solution Place 1 drop into both eyes at bedtime.    [provider]  traZODone (DESYREL) 50 MG tablet Take 50-100 mg by mouth at bedtime.    [provider]      Allergies    Codeine, Other, Oxycodone, Paxil [paroxetine hcl], and Procaine    Review of Systems   Review of Systems  Gastrointestinal:  Positive for constipation.    Physical Exam Updated Vital Signs BP 132/68 (BP Location: Left Arm)   Pulse 62   Temp 98.1 F (36.7 C) (Oral)   Resp 18   Ht 5\' 4"  (1.626 m)   Wt 78.5 kg   SpO2 95%   BMI 29.70 kg/m  Physical Exam Vitals and nursing note reviewed.  Constitutional:      General: She is not in acute distress.    Appearance: She is well-developed.  HENT:     Head: Normocephalic and atraumatic.  Eyes:     Conjunctiva/sclera: Conjunctivae normal.  Cardiovascular:     Rate and Rhythm: Normal rate and regular rhythm.     Heart sounds: No murmur heard. Pulmonary:     Effort: Pulmonary effort is normal. No respiratory distress.     Breath sounds: Normal breath sounds.  Abdominal:     Palpations: Abdomen is soft.     Tenderness: There is no abdominal tenderness.     Comments: Abdomen soft and nontender to palpation  Genitourinary:    Comments: Rectal exam performed, no stool felt Musculoskeletal:        General: No swelling.     Cervical back: Neck supple.  Skin:    General: Skin is warm and dry.     Capillary Refill: Capillary refill takes less than 2 seconds.     Comments: Surgical incision site  on the right upper abdomen with appropriate healing, no erythema or edema  Neurological:     Mental Status: She is alert.  Psychiatric:        Mood and Affect: Mood normal.     ED Results / Procedures / Treatments   Labs (all labs ordered are listed, but only abnormal results are displayed) Labs Reviewed  CBC WITH DIFFERENTIAL/PLATELET - Abnormal; Notable for the following components:      Result Value   RBC 3.06 (*)    Hemoglobin 9.5 (*)  HCT 30.7 (*)    MCV 100.3 (*)    All other components within normal limits  COMPREHENSIVE METABOLIC PANEL - Abnormal; Notable for the following components:   Glucose, Bld 110 (*)    Calcium 8.5 (*)    Total Protein 5.8 (*)    Albumin 2.8 (*)    AST 13 (*)    All other components within normal limits  I-STAT CREATININE, ED    EKG None  Radiology No results found.  Procedures Procedures    Medications Ordered in ED Medications  sorbitol, milk of mag, mineral oil, glycerin (SMOG) enema (960 mLs Rectal Given 11/29/22 1605)  ibuprofen (ADVIL) tablet 800 mg (800 mg Oral Given 11/29/22 1436)    ED Course/ Medical Decision Making/ A&P                                 Medical Decision Making Amount and/or Complexity of Data Reviewed Labs: ordered. Radiology: ordered.  Risk Prescription drug management.    Differential diagnosis includes but is not limited to opioid-induced constipation, functional constipation, small bowel obstruction, post-op complication  ED Course:  Patient overall well-appearing, no acute distress.  Vital signs stable.  Abdomen soft and nontender.  Rectal exam was performed to attempt manual disimpaction, no stool was felt.  We discussed further management with enema, patient would like to attempt this.  Patient also requests CT scan for further evaluation.  I think this is reasonable given recent surgery and difficulty having bowel movements, will rule out bowel obstruction with CT scan. Inform patient  that the CT scanner was down.  We will attempt enema and if she is still unable to have a bowel movement, will plan for transfer to obtain CT.   Upon re-evaluation, after enema, patient has had 4 bowel movements thus far.  Reports her abdominal pain is improving.  She still feels like she needs to have additional bowel movements.  Was placed back on the bedpan. Upon reevaluation, patient has had several bowel movements.  She reports her abdominal pain is completely improved.  No concern for small bowel obstruction or any other acute pathology at this time.  Her CMP and CBC was unremarkable and at baseline.  Vital signs stable.  Feel she is appropriate for discharge home at this time with follow-up with her surgical team and PCP for further management of her constipation and postop course.  Impression: Constipation  Disposition:  The patient was discharged home with instructions to use MiraLAX and bisacodyl to help maintain regular bowel movements.  Follow-up with surgical team for routine postop checks and PCP for further management of constipation. Return precautions given.  Lab Tests: I Ordered, and personally interpreted labs.  The pertinent results include:   CBC without leukocytosis CMP unremarkable           Final Clinical Impression(s) / ED Diagnoses Final diagnoses:  Constipation, unspecified constipation type    Rx / DC Orders ED Discharge Orders     None         Arabella Merles, PA-C 11/29/22 1903    Lonell Grandchild, MD 11/30/22 [phone]

## 2022-11-29 NOTE — ED Notes (Signed)
Pt able to make ample amount of feces following enema. Pt states she feels better already

## 2022-11-29 NOTE — Discharge Instructions (Addendum)
We gave you an enema today which helped to start move your bowels.  Drink plenty of fluids at home and eat a fiber fillet diet (lots of fruits and vegetables). Please engage in daily exercise as this also helps to maintain regular bowel movements.   Try to use the bathroom after eating a meal. Place your feet up on a small stepstool when trying to have a bowel movement.  Take 1-2 scoops of Miralax in the morning. You may also take Ducolax (Bisacodyl) 2 tablets (10mg ) once daily in the morning to help maintain normal bowel movements.  These are medications you can get over the counter at any drugstore.   Please follow-up with your surgical team for your routine postop visits.  If you continue to have issues with constipation, please follow-up with your primary care provider to discuss further management.  Your calcium is slightly low today at 8.5 (normal above 8.9-10.3).  Please continue to eat calcium rich foods at home such as dairy products.  Return the ER if you have worsening abdominal pain, you are not passing gas, you have uncontrolled vomiting, any other new or concerning symptoms.

## 2022-11-29 NOTE — ED Triage Notes (Signed)
Pt reports she has only had 1 BM in 12 days since having lung surgery.  Pt reports she still has a lot of pain and some swelling to her surgical site.

## 2022-11-29 NOTE — ED Notes (Signed)
CT down

## 2022-11-30 LAB — I-STAT CREATININE, ED: Creatinine, Ser: 0.8 mg/dL (ref 0.44–1.00)

## 2022-12-01 ENCOUNTER — Telehealth: Payer: Self-pay | Admitting: *Deleted

## 2022-12-01 DIAGNOSIS — Z299 Encounter for prophylactic measures, unspecified: Secondary | ICD-10-CM | POA: Diagnosis not present

## 2022-12-01 DIAGNOSIS — C771 Secondary and unspecified malignant neoplasm of intrathoracic lymph nodes: Secondary | ICD-10-CM | POA: Diagnosis not present

## 2022-12-01 DIAGNOSIS — C3411 Malignant neoplasm of upper lobe, right bronchus or lung: Secondary | ICD-10-CM | POA: Diagnosis not present

## 2022-12-01 DIAGNOSIS — I10 Essential (primary) hypertension: Secondary | ICD-10-CM | POA: Diagnosis not present

## 2022-12-01 NOTE — Telephone Encounter (Signed)
Patient's daughter, Amy, contacted the office stating patient has swelling near an incision that she noticed yesterday. Patient was seen by her PCP today and stated the swelling could be caused by fluid from the tissue. PCP recommended MRI to evaluate, however, he could not order. Daughter asking if imaging is necessary. Daughter reports lump to be firm. States there is bruising around incision. Discussed with Gaynelle Arabian, PA, who stated appt is necessary to evaluate prior to any imaging being ordered. Appt scheduled for 11/7. Amy verbalized understanding.

## 2022-12-02 DIAGNOSIS — C771 Secondary and unspecified malignant neoplasm of intrathoracic lymph nodes: Secondary | ICD-10-CM | POA: Diagnosis not present

## 2022-12-02 DIAGNOSIS — C3411 Malignant neoplasm of upper lobe, right bronchus or lung: Secondary | ICD-10-CM | POA: Diagnosis not present

## 2022-12-02 DIAGNOSIS — C3491 Malignant neoplasm of unspecified part of right bronchus or lung: Secondary | ICD-10-CM | POA: Diagnosis not present

## 2022-12-03 DIAGNOSIS — I700 Atherosclerosis of aorta: Secondary | ICD-10-CM | POA: Diagnosis not present

## 2022-12-03 DIAGNOSIS — R591 Generalized enlarged lymph nodes: Secondary | ICD-10-CM | POA: Diagnosis not present

## 2022-12-03 DIAGNOSIS — J90 Pleural effusion, not elsewhere classified: Secondary | ICD-10-CM | POA: Diagnosis not present

## 2022-12-03 DIAGNOSIS — C3491 Malignant neoplasm of unspecified part of right bronchus or lung: Secondary | ICD-10-CM | POA: Diagnosis not present

## 2022-12-03 NOTE — Progress Notes (Unsigned)
[address]       [name] [address]             [phone]    HPI:   Patient is S/P Robotic assisted right upper lobe wedge resection by Dr. Cliffton Asters on 10/21.  She was evaluated by her PCP who was concerned about a "lump" along her surgical incisions concerning for seroma.  They attempted to obtain MRI which could not be ordered.  Her daughter contacted our office and it was recommended she come in for evaluation.  Of note she underwent evaluation with radiation/oncology yesterday 11/6 in [place].  CT of the chest and abdomen was obtained which showed ***.  Current Outpatient Medications  Medication Sig Dispense Refill   acetaminophen (TYLENOL) 500 MG tablet Take 500 mg by mouth every 6 (six) hours as needed for moderate pain. 3 -5 per day     albuterol (PROVENTIL HFA;VENTOLIN HFA) 108 (90 BASE) MCG/ACT inhaler Inhale 2 puffs into the lungs every 6 (six) hours as needed for wheezing or shortness of breath.     allopurinol (ZYLOPRIM) 300 MG tablet Take 1 tablet (300 mg total) by mouth daily. 90 tablet 1   ALPRAZolam (XANAX) 0.5 MG tablet Take 0.5 mg by mouth 2 (two) times daily as needed for anxiety.     apixaban (ELIQUIS) 5 MG TABS tablet Take 1 tablet (5 mg total) by mouth 2 (two) times daily. 180 tablet 1   APPLE CIDER VINEGAR PO Take 625 mg by mouth daily.     Biotin 5000 MCG TABS Take 5,000-10,000 mcg by mouth daily.     calcium-vitamin D 250-100 MG-UNIT tablet Take 1 tablet by mouth daily.     Cholecalciferol (VITAMIN D-3) 125 MCG (5000 UT) TABS Take 5,000 Units by mouth daily.     Cranberry-Vitamin C (CRANBERRY CONCENTRATE/VITAMINC PO) Take 250 mg by mouth daily.     cyanocobalamin 2000 MCG tablet Take 5,000 mcg by mouth daily.     diclofenac Sodium (VOLTAREN) 1 % GEL Apply 2-4 g topically 4 (four) times daily. APPLY 2-4 GRAMS TOPICALLY FOUR TIMES DAILY Strength: 1 % 200 g 3   docusate sodium (COLACE) 100 MG capsule Take 100 mg by mouth daily.     dorzolamide  (TRUSOPT) 2 % ophthalmic solution Place 1 drop into the right eye daily.     doxepin (SINEQUAN) 100 MG capsule Take 1 capsule (100 mg total) by mouth at bedtime. 90 capsule 1   Fluticasone-Umeclidin-Vilant (TRELEGY ELLIPTA) 100-62.5-25 MCG/ACT AEPB Inhale 1 puff into the lungs daily. 60 each 11   gabapentin (NEURONTIN) 300 MG capsule Take 300 mg by mouth 4 (four) times daily as needed (pain).     levocetirizine (XYZAL) 5 MG tablet Take 5 mg by mouth every evening.     lubiprostone (AMITIZA) 24 MCG capsule Take 1 capsule (24 mcg total) by mouth 2 (two) times daily with a meal. 180 capsule 3   magnesium oxide (MAG-OX) 400 (240 Mg) MG tablet Take 400 mg by mouth every Monday, Wednesday, and Friday.     meloxicam (MOBIC) 15 MG tablet Take 15 mg by mouth daily.     metoprolol tartrate (LOPRESSOR) 25 MG tablet Take 0.5 tablets (12.5 mg total) by mouth 2 (two) times daily. (Patient taking differently: Take 25 mg by mouth at bedtime.) 135 tablet 2   omeprazole (PRILOSEC) 40 MG capsule Take 1 capsule (40 mg total) by mouth daily. 90 capsule 3   Potassium 99 MG  TABS Take 99 mg by mouth every Monday, Wednesday, and Friday.     pravastatin (PRAVACHOL) 20 MG tablet Take 20 mg by mouth as directed. 3 x week     prochlorperazine (COMPAZINE) 10 MG tablet Take 1 tablet (10 mg total) by mouth every 6 (six) hours as needed for nausea or vomiting. 30 tablet 1   senna (SENOKOT) 8.6 MG tablet Take 5 tablets by mouth 2 (two) times daily.     traMADol (ULTRAM) 50 MG tablet Take 1 tablet (50 mg total) by mouth every 6 (six) hours as needed for moderate pain (pain score 4-6) (mild pain). 28 tablet 0   Travoprost, BAK Free, (TRAVATAN) 0.004 % SOLN ophthalmic solution Place 1 drop into both eyes at bedtime.     traZODone (DESYREL) 50 MG tablet Take 50-100 mg by mouth at bedtime.     No current facility-administered medications for this visit.   Facility-Administered Medications Ordered in Other Visits  Medication Dose  Route Frequency Provider Last Rate Last Admin   regadenoson (LEXISCAN) injection SOLN 0.4 mg  0.4 mg Intravenous Once Hilty, Lisette Abu, MD       technetium tetrofosmin (TC-MYOVIEW) injection 31.4 millicurie  31.4 millicurie Intravenous Once PRN Chrystie Nose, MD        Physical Exam: ***  Diagnostic Tests: ***  Impression: ***  Plan: ***   Lowella Dandy, PA-C [facility] [phone]

## 2022-12-04 ENCOUNTER — Ambulatory Visit (INDEPENDENT_AMBULATORY_CARE_PROVIDER_SITE_OTHER): Payer: HMO | Admitting: Physician Assistant

## 2022-12-04 ENCOUNTER — Ambulatory Visit (INDEPENDENT_AMBULATORY_CARE_PROVIDER_SITE_OTHER): Payer: HMO | Admitting: Gastroenterology

## 2022-12-04 VITALS — BP 111/64 | HR 73 | Resp 20 | Ht 64.0 in | Wt 173.0 lb

## 2022-12-04 DIAGNOSIS — Z902 Acquired absence of lung [part of]: Secondary | ICD-10-CM | POA: Diagnosis not present

## 2022-12-07 DIAGNOSIS — C3491 Malignant neoplasm of unspecified part of right bronchus or lung: Secondary | ICD-10-CM | POA: Diagnosis not present

## 2022-12-07 DIAGNOSIS — C3411 Malignant neoplasm of upper lobe, right bronchus or lung: Secondary | ICD-10-CM | POA: Diagnosis not present

## 2022-12-08 DIAGNOSIS — C771 Secondary and unspecified malignant neoplasm of intrathoracic lymph nodes: Secondary | ICD-10-CM | POA: Diagnosis not present

## 2022-12-08 DIAGNOSIS — C3411 Malignant neoplasm of upper lobe, right bronchus or lung: Secondary | ICD-10-CM | POA: Diagnosis not present

## 2022-12-08 DIAGNOSIS — C3491 Malignant neoplasm of unspecified part of right bronchus or lung: Secondary | ICD-10-CM | POA: Diagnosis not present

## 2022-12-09 DIAGNOSIS — Z452 Encounter for adjustment and management of vascular access device: Secondary | ICD-10-CM | POA: Diagnosis not present

## 2022-12-09 DIAGNOSIS — C3491 Malignant neoplasm of unspecified part of right bronchus or lung: Secondary | ICD-10-CM | POA: Diagnosis not present

## 2022-12-09 DIAGNOSIS — C3411 Malignant neoplasm of upper lobe, right bronchus or lung: Secondary | ICD-10-CM | POA: Diagnosis not present

## 2022-12-09 DIAGNOSIS — R110 Nausea: Secondary | ICD-10-CM | POA: Diagnosis not present

## 2022-12-09 DIAGNOSIS — K5900 Constipation, unspecified: Secondary | ICD-10-CM | POA: Diagnosis not present

## 2022-12-11 ENCOUNTER — Telehealth: Payer: Self-pay | Admitting: Internal Medicine

## 2022-12-11 NOTE — Telephone Encounter (Signed)
   Pre-operative Risk Assessment    Patient Name: Zoe [name]  DOB: Jan 24, 1936 MRN: [id number]     Request for Surgical Clearance   Procedure:   Power port placement for chemotherapy  Date of Surgery:  Clearance 12/17/22                                 Surgeon:  Dr. Aurea Graff Surgeon's Group or Practice Name:  [facility]  Phone number:  [phone] Fax number:  [phone]   Type of Clearance Requested:   - Pharmacy:  Hold Apixaban (Eliquis)     Type of Anesthesia:  Not Indicated   Additional requests/questions:  Please advise surgeon/provider what medications should be held.  Signed, Vallarie Mare   12/11/2022, 1:30 PM

## 2022-12-12 NOTE — Telephone Encounter (Signed)
Patient with diagnosis of atrial fibrillation on Eliquis for anticoagulation.    Procedure:   Power port placement for chemotherapy   Date of Surgery:  Clearance 12/17/22     CHA2DS2-VASc Score = 5   This indicates a 7.2% annual risk of stroke. The patient's score is based upon: CHF History: 1 HTN History: 1 Diabetes History: 0 Stroke History: 0 Vascular Disease History: 0 Age Score: 2 Gender Score: 1    CrCl 53 Platelet count 288  Per office protocol, patient can hold Eliquis for 2 days prior to procedure.   Patient will not need bridging with Lovenox (enoxaparin) around procedure.  **This guidance is not considered finalized until pre-operative APP has relayed final recommendations.**

## 2022-12-12 NOTE — Telephone Encounter (Signed)
   Patient Name: Zoe [name]  DOB: February 13, 1935 MRN: [id number]  Primary Cardiologist: Marjo Bicker, MD  Clinical pharmacists have reviewed the patient's past medical history, labs, and current medications as part of preoperative protocol coverage. The following recommendations have been made:  Patient with diagnosis of atrial fibrillation on Eliquis for anticoagulation.     Procedure:   Power port placement for chemotherapy   Date of Surgery:  Clearance 12/17/22       CHA2DS2-VASc Score = 5   This indicates a 7.2% annual risk of stroke. The patient's score is based upon: CHF History: 1 HTN History: 1 Diabetes History: 0 Stroke History: 0 Vascular Disease History: 0 Age Score: 2 Gender Score: 1     CrCl 53 Platelet count 288   Per office protocol, patient can hold Eliquis for 2 days prior to procedure.   Patient will not need bridging with Lovenox (enoxaparin) around procedure. Please resume Eliquis as soon as possible postprocedure, at the discretion of the surgeon.     I will route this recommendation to the requesting party via Epic fax function and remove from pre-op pool.  Please call with questions.  Joylene Grapes, NP 12/12/2022, 8:40 AM

## 2022-12-17 DIAGNOSIS — C3491 Malignant neoplasm of unspecified part of right bronchus or lung: Secondary | ICD-10-CM | POA: Diagnosis not present

## 2022-12-17 DIAGNOSIS — C3490 Malignant neoplasm of unspecified part of unspecified bronchus or lung: Secondary | ICD-10-CM | POA: Diagnosis not present

## 2022-12-17 DIAGNOSIS — M797 Fibromyalgia: Secondary | ICD-10-CM | POA: Diagnosis not present

## 2022-12-17 DIAGNOSIS — I4811 Longstanding persistent atrial fibrillation: Secondary | ICD-10-CM | POA: Diagnosis not present

## 2022-12-17 DIAGNOSIS — J449 Chronic obstructive pulmonary disease, unspecified: Secondary | ICD-10-CM | POA: Diagnosis not present

## 2022-12-17 DIAGNOSIS — K219 Gastro-esophageal reflux disease without esophagitis: Secondary | ICD-10-CM | POA: Diagnosis not present

## 2022-12-22 ENCOUNTER — Encounter (INDEPENDENT_AMBULATORY_CARE_PROVIDER_SITE_OTHER): Payer: Self-pay | Admitting: Gastroenterology

## 2022-12-22 ENCOUNTER — Ambulatory Visit (INDEPENDENT_AMBULATORY_CARE_PROVIDER_SITE_OTHER): Payer: HMO | Admitting: Gastroenterology

## 2022-12-22 ENCOUNTER — Other Ambulatory Visit: Payer: Self-pay | Admitting: Thoracic Surgery (Cardiothoracic Vascular Surgery)

## 2022-12-22 VITALS — BP 92/65 | HR 70 | Temp 98.2°F | Ht 64.0 in | Wt 171.9 lb

## 2022-12-22 DIAGNOSIS — C3491 Malignant neoplasm of unspecified part of right bronchus or lung: Secondary | ICD-10-CM | POA: Diagnosis not present

## 2022-12-22 DIAGNOSIS — C3411 Malignant neoplasm of upper lobe, right bronchus or lung: Secondary | ICD-10-CM | POA: Diagnosis not present

## 2022-12-22 DIAGNOSIS — K219 Gastro-esophageal reflux disease without esophagitis: Secondary | ICD-10-CM

## 2022-12-22 DIAGNOSIS — K2270 Barrett's esophagus without dysplasia: Secondary | ICD-10-CM

## 2022-12-22 DIAGNOSIS — R1319 Other dysphagia: Secondary | ICD-10-CM

## 2022-12-22 DIAGNOSIS — R1310 Dysphagia, unspecified: Secondary | ICD-10-CM

## 2022-12-22 DIAGNOSIS — C771 Secondary and unspecified malignant neoplasm of intrathoracic lymph nodes: Secondary | ICD-10-CM | POA: Diagnosis not present

## 2022-12-22 DIAGNOSIS — K581 Irritable bowel syndrome with constipation: Secondary | ICD-10-CM

## 2022-12-22 DIAGNOSIS — Z902 Acquired absence of lung [part of]: Secondary | ICD-10-CM

## 2022-12-22 NOTE — Patient Instructions (Signed)
Continue omeprazole 20 milligrams every day Continue lactulose for 10 mL daily, can increase up to 3 times per day if needed

## 2022-12-22 NOTE — Progress Notes (Signed)
Katrinka Blazing, M.D. Gastroenterology & Hepatology [facility] [address] [place], [place] [address]  Primary Care Physician: Ignatius Specking, MD [address] [place] [address]  I will communicate my assessment and recommendations to the referring MD via EMR.  Problems: GERD Short segment Barrett's esophagus IBS-C   History of Present Illness: Zoe [name] is a 87 y.o. female with PMH lung cancer status post right upper lobe wedge resection, GERD c/d BE, IBS-C, who presents for follow up of dysphagia and constipation.  The patient was last seen on 09/18/2022. At that time, the patient was endorsing dysphagia and requested having a repeat EGD for evaluation of her symptoms.  Patient was cleared by cardiology to proceed with EGD.  However, she canceled her procedure as her symptoms improved.  Omeprazole was increased to 40 mg daily.  Patient states that she started using lactulose 15 mL since September 2024, which has helped her with her Bms, as she is moving her bowels almost every day.  Is not taking any other laxative.  States that her dysphagia has also resolved as she is not having any issues with it. No heartburn or odynophagia. Takes omeprazole 20 mg qday.  The patient denies having any nausea, vomiting, fever, chills, hematochezia, melena, hematemesis, abdominal distention, abdominal pain, diarrhea, jaundice, pruritus or weight loss.  She is supposed to start radiation therapy for her lung cancer next week. Still has presence of pain at the incision site of her lung resection.   Last EGD: 03/13/2021 - Normal hypopharynx. - Normal esophagus. - Z-line irregular, 38 cm from the incisors with texture change. Biopsied post dilation. - 2 cm hiatal hernia. - No endoscopic esophageal abnormality to explain patient's dysphagia. Esophagus dilated. - Gastritis. Biopsied. - Normal duodenal bulb and second portion of the duodenum.    Path: A. ANTRUM, BIOPSY:  - Gastric antral mucosa with mild nonspecific reactive gastropathy  - Helicobacter pylori-like organisms are not identified on routine HE  stain   B. STOMACH, BODY, BIOPSY:  - Gastric oxyntic mucosa with parietal cell hyperplasia as can be seen  in hypergastrinemic states such as PPI therapy.  - Helicobacter pylori-like organisms are not identified on routine HE  stain   C. GE JUNCTION, BIOPSY:  - Esophageal squamous and cardiac mucosa with focal intestinal  metaplasia. See note  - Negative for dysplasia    Last Colonoscopy: 2019 - Preparation of the colon was fair. - Melanosis in the colon. - External hemorrhoids.  Past Medical History: Past Medical History:  Diagnosis Date   A-fib [facility])    pt went into A-fib during lung biopsies   Abdominal discomfort 11/02/2012   suspected ?UTI- PCP MD placed on Cipro   Anxiety    Arthritis    Asthma    Cancer (HCC)    skin CA   Choking due to phlegm    Chronic constipation    Depression    Dysrhythmia    tachycardia   Environmental allergies    Fibromyalgia    GERD (gastroesophageal reflux disease)    Hemorrhoids    Hiatal hernia    Hypercholesteremia    Hypertension    Sleep apnea     Past Surgical History: Past Surgical History:  Procedure Laterality Date   ANAL RECTAL MANOMETRY N/A 11/28/2013   Procedure: ANO RECTAL MANOMETRY;  Surgeon: Romie Levee, MD;  Location: [facility];  Service: Endoscopy;  Laterality: N/A;   APPENDECTOMY     BACK SURGERY  X2    BIOPSY  03/13/2021   Procedure: BIOPSY;  Surgeon: Malissa Hippo, MD;  Location: [facility];  Service: Endoscopy;;   BRONCHIAL BIOPSY  09/02/2022   Procedure: BRONCHIAL BIOPSIES;  Surgeon: Josephine Igo, DO;  Location: [facility];  Service: Pulmonary;;   BRONCHIAL BRUSHINGS  09/02/2022   Procedure: BRONCHIAL BRUSHINGS;  Surgeon: Josephine Igo, DO;  [facility]: [facility];  Service: Pulmonary;;   BRONCHIAL NEEDLE ASPIRATION  BIOPSY  09/02/2022   Procedure: BRONCHIAL NEEDLE ASPIRATION BIOPSIES;  Surgeon: Josephine Igo, DO;  [facility]: [facility];  Service: Pulmonary;;   CHOLECYSTECTOMY     COLONOSCOPY  03/29/03   COLONOSCOPY N/A 12/09/2013   Procedure: COLONOSCOPY;  Surgeon: Malissa Hippo, MD;  [facility];  Service: Endoscopy;  Laterality: N/A;  730   COLONOSCOPY N/A 02/04/2017   Procedure: COLONOSCOPY;  Surgeon: Malissa Hippo, MD;  [facility];  Service: Endoscopy;  Laterality: N/A;  930   ENDOBRONCHIAL ULTRASOUND Bilateral 09/02/2022   Procedure: ENDOBRONCHIAL ULTRASOUND;  Surgeon: Josephine Igo, DO;  [facility];  Service: Pulmonary;  Laterality: Bilateral;   ESOPHAGEAL DILATION N/A 03/13/2021   Procedure: ESOPHAGEAL DILATION;  Surgeon: Malissa Hippo, MD;  [facility];  Service: Endoscopy;  Laterality: N/A;   ESOPHAGOGASTRODUODENOSCOPY (EGD) WITH PROPOFOL N/A 03/13/2021   Procedure: ESOPHAGOGASTRODUODENOSCOPY (EGD) WITH PROPOFOL;  Surgeon: Malissa Hippo, MD;  [facility];  Service: Endoscopy;  Laterality: N/A;  8:05   EYE SURGERY     bilateral cataract removal   INTERCOSTAL NERVE BLOCK Right 11/17/2022   Procedure: INTERCOSTAL NERVE BLOCK;  Surgeon: Corliss Skains, MD;  Location: [facility];  Service: Thoracic;  Laterality: Right;   IR KYPHO LUMBAR INC FX REDUCE BONE BX UNI/BIL CANNULATION INC/IMAGING  11/24/2019   IR KYPHO THORACIC WITH BONE BIOPSY  08/23/2019   IR KYPHO THORACIC WITH BONE BIOPSY  11/24/2019   IR RADIOLOGIST EVAL & MGMT  08/03/2019   IR RADIOLOGIST EVAL & MGMT  09/29/2019   IR RADIOLOGIST EVAL & MGMT  10/18/2019   IR RADIOLOGIST EVAL & MGMT  04/11/2020   NODE DISSECTION Right 11/17/2022   Procedure: NODE DISSECTION;  Surgeon: Corliss Skains, MD;  [facility]: [facility];  Service: Thoracic;  Laterality: Right;   SKIN CANCER EXCISION     RIGHT NECK     TONSILLECTOMY     T+A   TOTAL HIP ARTHROPLASTY Right 09/14/2012    Procedure: RIGHT TOTAL HIP ARTHROPLASTY ANTERIOR APPROACH and RIGHT KNEE STEROID INJECTION;  Surgeon: Kathryne Hitch, MD;  Location: [facility];  Service: Orthopedics;  Laterality: Right;   TOTAL HIP ARTHROPLASTY Left 11/05/2012   Procedure: LEFT TOTAL HIP ARTHROPLASTY ANTERIOR APPROACH;  Surgeon: Kathryne Hitch, MD;  Location: [facility];  Service: Orthopedics;  Laterality: Left;   UMBILICAL HERNIA REPAIR  04/26/03   JENKINS   UPPER GASTROINTESTINAL ENDOSCOPY  08/03/2009   NUR   UPPER GASTROINTESTINAL ENDOSCOPY  03/29/2003   EGD ED TCS    Family History: Family History  Problem Relation Age of Onset   Healthy Son    Healthy Son    Healthy Daughter    Colon cancer Neg Hx     Social History: Social History   Tobacco Use  Smoking Status Former   Current packs/day: 0.00   Average packs/day: 0.5 packs/day for 50.0 years (25.0 ttl pk-yrs)   Types: Cigarettes   Start date: 09/07/1970   Quit date: 09/06/2020  Years since quitting: 2.2  Smokeless Tobacco Never   Social History   Substance and Sexual Activity  Alcohol Use Yes   Comment: 1-2 drinks per week   Social History   Substance and Sexual Activity  Drug Use No    Allergies: Allergies  Allergen Reactions   Codeine Other (See Comments)    Pains in abdominal area   Other     NOVACAINE     ITCHING Anti-depressants   Oxycodone     Abdominal pain, chest pain   Paxil [Paroxetine Hcl] Itching   Procaine Itching    Medications: Current Outpatient Medications  Medication Sig Dispense Refill   albuterol (PROVENTIL HFA;VENTOLIN HFA) 108 (90 BASE) MCG/ACT inhaler Inhale 2 puffs into the lungs every 6 (six) hours as needed for wheezing or shortness of breath.     allopurinol (ZYLOPRIM) 300 MG tablet Take 1 tablet (300 mg total) by mouth daily. 90 tablet 1   ALPRAZolam (XANAX) 0.5 MG tablet Take 0.5 mg by mouth 2 (two) times daily as needed for anxiety.     apixaban (ELIQUIS) 5 MG TABS tablet Take 1 tablet (5 mg  total) by mouth 2 (two) times daily. 180 tablet 1   APPLE CIDER VINEGAR PO Take 625 mg by mouth daily.     Biotin 5000 MCG TABS Take 5,000-10,000 mcg by mouth daily.     calcium-vitamin D 250-100 MG-UNIT tablet Take 1 tablet by mouth daily.     Cholecalciferol (VITAMIN D-3) 125 MCG (5000 UT) TABS Take 5,000 Units by mouth daily.     Cranberry-Vitamin C (CRANBERRY CONCENTRATE/VITAMINC PO) Take 250 mg by mouth daily.     cyanocobalamin 2000 MCG tablet Take 5,000 mcg by mouth daily.     diclofenac Sodium (VOLTAREN) 1 % GEL Apply 2-4 g topically 4 (four) times daily. APPLY 2-4 GRAMS TOPICALLY FOUR TIMES DAILY Strength: 1 % 200 g 3   docusate sodium (COLACE) 100 MG capsule Take 100 mg by mouth daily.     dorzolamide (TRUSOPT) 2 % ophthalmic solution Place 1 drop into the right eye daily.     doxepin (SINEQUAN) 100 MG capsule Take 1 capsule (100 mg total) by mouth at bedtime. 90 capsule 1   Fluticasone-Umeclidin-Vilant (TRELEGY ELLIPTA) 100-62.5-25 MCG/ACT AEPB Inhale 1 puff into the lungs daily. 60 each 11   gabapentin (NEURONTIN) 300 MG capsule Take 300 mg by mouth 4 (four) times daily as needed (pain).     ibuprofen (ADVIL) 800 MG tablet Take 800 mg by mouth every 6 (six) hours as needed.     lactulose (CHRONULAC) 10 GM/15ML solution 15 ml daily     levocetirizine (XYZAL) 5 MG tablet Take 5 mg by mouth every evening.     lubiprostone (AMITIZA) 24 MCG capsule Take 1 capsule (24 mcg total) by mouth 2 (two) times daily with a meal. 180 capsule 3   magnesium oxide (MAG-OX) 400 (240 Mg) MG tablet Take 400 mg by mouth every Monday, Wednesday, and Friday.     meloxicam (MOBIC) 15 MG tablet Take 15 mg by mouth daily.     metoprolol tartrate (LOPRESSOR) 25 MG tablet Take 0.5 tablets (12.5 mg total) by mouth 2 (two) times daily. (Patient taking differently: Take 25 mg by mouth at bedtime.) 135 tablet 2   omeprazole (PRILOSEC) 40 MG capsule Take 1 capsule (40 mg total) by mouth daily. 90 capsule 3    Potassium 99 MG TABS Take 99 mg by mouth every Monday, Wednesday, and Friday.  pravastatin (PRAVACHOL) 20 MG tablet Take 20 mg by mouth as directed. 3 x week     senna (SENOKOT) 8.6 MG tablet Take 5 tablets by mouth 2 (two) times daily.     Travoprost, BAK Free, (TRAVATAN) 0.004 % SOLN ophthalmic solution Place 1 drop into both eyes at bedtime.     traZODone (DESYREL) 50 MG tablet Take 50-100 mg by mouth at bedtime.     prochlorperazine (COMPAZINE) 10 MG tablet Take 1 tablet (10 mg total) by mouth every 6 (six) hours as needed for nausea or vomiting. (Patient not taking: Reported on 12/22/2022) 30 tablet 1   traMADol (ULTRAM) 50 MG tablet Take 1 tablet (50 mg total) by mouth every 6 (six) hours as needed for moderate pain (pain score 4-6) (mild pain). (Patient not taking: Reported on 12/22/2022) 28 tablet 0   No current facility-administered medications for this visit.   Facility-Administered Medications Ordered in Other Visits  Medication Dose Route Frequency Provider Last Rate Last Admin   regadenoson (LEXISCAN) injection SOLN 0.4 mg  0.4 mg Intravenous Once Hilty, Lisette Abu, MD       technetium tetrofosmin (TC-MYOVIEW) injection 31.4 millicurie  31.4 millicurie Intravenous Once PRN Hilty, Lisette Abu, MD        Review of Systems: GENERAL: negative for malaise, night sweats HEENT: No changes in hearing or vision, no nose bleeds or other nasal problems. NECK: Negative for lumps, goiter, pain and significant neck swelling RESPIRATORY: Negative for cough, wheezing CARDIOVASCULAR: Negative for chest pain, leg swelling, palpitations, orthopnea GI: SEE HPI MUSCULOSKELETAL: Negative for joint pain or swelling, back pain, and muscle pain. SKIN: Negative for lesions, rash PSYCH: Negative for sleep disturbance, mood disorder and recent psychosocial stressors. HEMATOLOGY Negative for prolonged bleeding, bruising easily, and swollen nodes. ENDOCRINE: Negative for cold or heat intolerance, polyuria,  polydipsia and goiter. NEURO: negative for tremor, gait imbalance, syncope and seizures. The remainder of the review of systems is noncontributory.   Physical Exam: BP 92/65 (BP Location: Left Arm, Patient Position: Sitting, Cuff Size: Large)   Pulse 70   Temp 98.2 F (36.8 C) (Oral)   Ht 5\' 4"  (1.626 m)   Wt 171 lb 14.4 oz (78 kg)   BMI 29.51 kg/m  GENERAL: The patient is AO x3, in no acute distress. HEENT: Head is normocephalic and atraumatic. EOMI are intact. Mouth is well hydrated and without lesions. NECK: Supple. No masses LUNGS: Clear to auscultation. No presence of rhonchi/wheezing/rales. Adequate chest expansion HEART: RRR, normal s1 and s2. ABDOMEN: Soft, nontender, no guarding, no peritoneal signs, and nondistended. BS +. No masses. EXTREMITIES: Without any cyanosis, clubbing, rash, lesions or edema. NEUROLOGIC: AOx3, no focal motor deficit. SKIN: no jaundice, no rashes  Imaging/Labs: as above  I personally reviewed and interpreted the available labs, imaging and endoscopic files.  Impression and Plan: Zoe [name] is a 87 y.o. female with PMH lung cancer status post right upper lobe wedge resection, GERD c/d BE, IBS-C, who presents for follow up of dysphagia and constipation.  Patient presented significant improvement of her constipation while taking lactulose as she is having regular bowel movements.  She should continue with this regimen for now and can increase the doses if presenting recurrent constipation.  She has also presented improvement of her dysphagia after taking omeprazole regularly.  Will continue with her current dose of 20 mg every day.  -Continue omeprazole 20 milligrams every day -Continue lactulose for 10 mL daily, can increase up to 3 times per  day if needed  All questions were answered.      Katrinka Blazing, MD Gastroenterology and Hepatology [facility]

## 2022-12-23 DIAGNOSIS — C771 Secondary and unspecified malignant neoplasm of intrathoracic lymph nodes: Secondary | ICD-10-CM | POA: Diagnosis not present

## 2022-12-23 DIAGNOSIS — C3411 Malignant neoplasm of upper lobe, right bronchus or lung: Secondary | ICD-10-CM | POA: Diagnosis not present

## 2022-12-29 DIAGNOSIS — C3411 Malignant neoplasm of upper lobe, right bronchus or lung: Secondary | ICD-10-CM | POA: Diagnosis not present

## 2022-12-29 DIAGNOSIS — C3491 Malignant neoplasm of unspecified part of right bronchus or lung: Secondary | ICD-10-CM | POA: Diagnosis not present

## 2022-12-29 DIAGNOSIS — C771 Secondary and unspecified malignant neoplasm of intrathoracic lymph nodes: Secondary | ICD-10-CM | POA: Diagnosis not present

## 2022-12-30 DIAGNOSIS — C3411 Malignant neoplasm of upper lobe, right bronchus or lung: Secondary | ICD-10-CM | POA: Diagnosis not present

## 2022-12-30 DIAGNOSIS — C771 Secondary and unspecified malignant neoplasm of intrathoracic lymph nodes: Secondary | ICD-10-CM | POA: Diagnosis not present

## 2022-12-30 DIAGNOSIS — C3491 Malignant neoplasm of unspecified part of right bronchus or lung: Secondary | ICD-10-CM | POA: Diagnosis not present

## 2022-12-30 NOTE — Progress Notes (Unsigned)
[address]       [name] [address]             [phone]  HPI: This is an 87 year old female who is s/p robotic assisted right video thoracoscopy, right upper lobe wedge resection, mediastinal lymph node sampling, and intercostal nerve block by Dr. Cliffton Asters on 11/17/2022. 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 She has already had a virtual visit with Dr. Cliffton Asters to discuss the pathology. Patient is currently undergoing radiation. She presents today for an in person post op follow up.    Current Outpatient Medications  Medication Sig Dispense Refill   albuterol (PROVENTIL HFA;VENTOLIN HFA) 108 (90 BASE) MCG/ACT inhaler Inhale 2 puffs into the lungs every 6 (six) hours as needed for wheezing or shortness of breath.     allopurinol (ZYLOPRIM) 300 MG tablet Take 1 tablet (300 mg total) by mouth daily. 90 tablet 1   ALPRAZolam (XANAX) 0.5 MG tablet Take 0.5 mg by mouth 2 (two) times daily as needed for anxiety.     apixaban (ELIQUIS) 5 MG TABS tablet Take 1 tablet (5 mg total) by mouth 2 (two) times daily. 180 tablet 1   APPLE CIDER VINEGAR PO Take 625 mg by mouth daily.     Biotin 5000 MCG TABS Take 5,000-10,000 mcg by mouth daily.     calcium-vitamin D 250-100 MG-UNIT tablet Take 1 tablet by mouth daily.     Cholecalciferol (VITAMIN D-3) 125 MCG (5000 UT) TABS Take 5,000 Units by mouth daily.     Cranberry-Vitamin C (CRANBERRY CONCENTRATE/VITAMINC PO) Take 250 mg by mouth daily.     cyanocobalamin 2000 MCG tablet Take 5,000 mcg by mouth daily.     diclofenac Sodium (VOLTAREN) 1 % GEL Apply 2-4 g topically 4 (four) times daily. APPLY 2-4 GRAMS TOPICALLY FOUR TIMES DAILY Strength: 1 % 200 g 3   docusate sodium (COLACE) 100 MG capsule Take 100 mg by mouth daily.     dorzolamide (TRUSOPT) 2 % ophthalmic solution Place 1 drop into the right eye daily.     doxepin (SINEQUAN) 100 MG capsule Take 1 capsule (100 mg total) by mouth at bedtime.  90 capsule 1   Fluticasone-Umeclidin-Vilant (TRELEGY ELLIPTA) 100-62.5-25 MCG/ACT AEPB Inhale 1 puff into the lungs daily. 60 each 11   gabapentin (NEURONTIN) 300 MG capsule Take 300 mg by mouth 4 (four) times daily as needed (pain).     ibuprofen (ADVIL) 800 MG tablet Take 800 mg by mouth every 6 (six) hours as needed.     lactulose (CHRONULAC) 10 GM/15ML solution 15 ml daily     levocetirizine (XYZAL) 5 MG tablet Take 5 mg by mouth every evening.     lubiprostone (AMITIZA) 24 MCG capsule Take 1 capsule (24 mcg total) by mouth 2 (two) times daily with a meal. 180 capsule 3   magnesium oxide (MAG-OX) 400 (240 Mg) MG tablet Take 400 mg by mouth every Monday, Wednesday, and Friday.     meloxicam (MOBIC) 15 MG tablet Take 15 mg by mouth daily.     metoprolol tartrate (LOPRESSOR) 25 MG tablet Take 0.5 tablets (12.5 mg total) by mouth 2 (two) times daily. (Patient taking differently: Take 25 mg by mouth at bedtime.) 135 tablet 2   omeprazole (PRILOSEC) 40 MG capsule Take 1 capsule (40 mg total) by mouth daily. 90 capsule 3   Potassium 99 MG TABS Take 99 mg by mouth every Monday, Wednesday, and Friday.  pravastatin (PRAVACHOL) 20 MG tablet Take 20 mg by mouth as directed. 3 x week     prochlorperazine (COMPAZINE) 10 MG tablet Take 1 tablet (10 mg total) by mouth every 6 (six) hours as needed for nausea or vomiting. (Patient not taking: Reported on 12/22/2022) 30 tablet 1   senna (SENOKOT) 8.6 MG tablet Take 5 tablets by mouth 2 (two) times daily.     traMADol (ULTRAM) 50 MG tablet Take 1 tablet (50 mg total) by mouth every 6 (six) hours as needed for moderate pain (pain score 4-6) (mild pain). (Patient not taking: Reported on 12/22/2022) 28 tablet 0   Travoprost, BAK Free, (TRAVATAN) 0.004 % SOLN ophthalmic solution Place 1 drop into both eyes at bedtime.     traZODone (DESYREL) 50 MG tablet Take 50-100 mg by mouth at bedtime.     No current facility-administered medications for this visit.    Facility-Administered Medications Ordered in Other Visits  Medication Dose Route Frequency Provider Last Rate Last Admin   regadenoson (LEXISCAN) injection SOLN 0.4 mg  0.4 mg Intravenous Once Hilty, Lisette Abu, MD       technetium tetrofosmin (TC-MYOVIEW) injection 31.4 millicurie  31.4 millicurie Intravenous Once PRN Hilty, Lisette Abu, MD      Vital Signs:  Physical Exam: CV Pulmonary Abdomen  Extremities Wounds  Diagnostic Tests: ***  Impression and Plan: We discussed today's chest x ray results. She will continue to be followed closely by Dr. Jenene Slicker from oncology for further surveillance. She will see Dr. Cliffton Asters PRN.    Ardelle Balls, PA-C [facility] [phone]

## 2022-12-31 ENCOUNTER — Ambulatory Visit: Payer: HMO

## 2022-12-31 DIAGNOSIS — C771 Secondary and unspecified malignant neoplasm of intrathoracic lymph nodes: Secondary | ICD-10-CM | POA: Diagnosis not present

## 2022-12-31 DIAGNOSIS — C3411 Malignant neoplasm of upper lobe, right bronchus or lung: Secondary | ICD-10-CM | POA: Diagnosis not present

## 2022-12-31 DIAGNOSIS — C3491 Malignant neoplasm of unspecified part of right bronchus or lung: Secondary | ICD-10-CM | POA: Diagnosis not present

## 2023-01-01 DIAGNOSIS — C3491 Malignant neoplasm of unspecified part of right bronchus or lung: Secondary | ICD-10-CM | POA: Diagnosis not present

## 2023-01-01 DIAGNOSIS — C771 Secondary and unspecified malignant neoplasm of intrathoracic lymph nodes: Secondary | ICD-10-CM | POA: Diagnosis not present

## 2023-01-01 DIAGNOSIS — R053 Chronic cough: Secondary | ICD-10-CM | POA: Diagnosis not present

## 2023-01-01 DIAGNOSIS — C3411 Malignant neoplasm of upper lobe, right bronchus or lung: Secondary | ICD-10-CM | POA: Diagnosis not present

## 2023-01-01 DIAGNOSIS — Z299 Encounter for prophylactic measures, unspecified: Secondary | ICD-10-CM | POA: Diagnosis not present

## 2023-01-02 DIAGNOSIS — C3411 Malignant neoplasm of upper lobe, right bronchus or lung: Secondary | ICD-10-CM | POA: Diagnosis not present

## 2023-01-02 DIAGNOSIS — C771 Secondary and unspecified malignant neoplasm of intrathoracic lymph nodes: Secondary | ICD-10-CM | POA: Diagnosis not present

## 2023-01-02 DIAGNOSIS — C3491 Malignant neoplasm of unspecified part of right bronchus or lung: Secondary | ICD-10-CM | POA: Diagnosis not present

## 2023-01-05 ENCOUNTER — Other Ambulatory Visit (INDEPENDENT_AMBULATORY_CARE_PROVIDER_SITE_OTHER): Payer: Self-pay | Admitting: Gastroenterology

## 2023-01-05 DIAGNOSIS — C771 Secondary and unspecified malignant neoplasm of intrathoracic lymph nodes: Secondary | ICD-10-CM | POA: Diagnosis not present

## 2023-01-05 DIAGNOSIS — C3411 Malignant neoplasm of upper lobe, right bronchus or lung: Secondary | ICD-10-CM | POA: Diagnosis not present

## 2023-01-05 DIAGNOSIS — K581 Irritable bowel syndrome with constipation: Secondary | ICD-10-CM

## 2023-01-05 NOTE — Telephone Encounter (Signed)
Last office visit she was not taking Amitiza but only lactulose.  Will not prescribe this unless the patient requested a refill (not the pharmacy).

## 2023-01-06 ENCOUNTER — Other Ambulatory Visit (INDEPENDENT_AMBULATORY_CARE_PROVIDER_SITE_OTHER): Payer: Self-pay | Admitting: Gastroenterology

## 2023-01-06 ENCOUNTER — Other Ambulatory Visit: Payer: Self-pay | Admitting: Thoracic Surgery (Cardiothoracic Vascular Surgery)

## 2023-01-06 DIAGNOSIS — C771 Secondary and unspecified malignant neoplasm of intrathoracic lymph nodes: Secondary | ICD-10-CM | POA: Diagnosis not present

## 2023-01-06 DIAGNOSIS — C3411 Malignant neoplasm of upper lobe, right bronchus or lung: Secondary | ICD-10-CM | POA: Diagnosis not present

## 2023-01-06 DIAGNOSIS — Z902 Acquired absence of lung [part of]: Secondary | ICD-10-CM

## 2023-01-06 DIAGNOSIS — K581 Irritable bowel syndrome with constipation: Secondary | ICD-10-CM

## 2023-01-07 ENCOUNTER — Ambulatory Visit (HOSPITAL_COMMUNITY)
Admission: RE | Admit: 2023-01-07 | Discharge: 2023-01-07 | Disposition: A | Discharge status: Home / Self Care | Payer: HMO | Source: Ambulatory Visit | Attending: Thoracic Surgery (Cardiothoracic Vascular Surgery) | Admitting: Thoracic Surgery (Cardiothoracic Vascular Surgery)

## 2023-01-07 ENCOUNTER — Ambulatory Visit: Payer: Self-pay

## 2023-01-07 DIAGNOSIS — J9811 Atelectasis: Secondary | ICD-10-CM | POA: Diagnosis not present

## 2023-01-07 DIAGNOSIS — C771 Secondary and unspecified malignant neoplasm of intrathoracic lymph nodes: Secondary | ICD-10-CM | POA: Diagnosis not present

## 2023-01-07 DIAGNOSIS — Z902 Acquired absence of lung [part of]: Secondary | ICD-10-CM | POA: Insufficient documentation

## 2023-01-07 DIAGNOSIS — C3411 Malignant neoplasm of upper lobe, right bronchus or lung: Secondary | ICD-10-CM | POA: Diagnosis not present

## 2023-01-07 DIAGNOSIS — R911 Solitary pulmonary nodule: Secondary | ICD-10-CM | POA: Diagnosis not present

## 2023-01-07 DIAGNOSIS — J90 Pleural effusion, not elsewhere classified: Secondary | ICD-10-CM | POA: Diagnosis not present

## 2023-01-07 NOTE — Telephone Encounter (Signed)
Already filled

## 2023-01-08 ENCOUNTER — Encounter: Payer: Self-pay | Admitting: Physician Assistant

## 2023-01-08 ENCOUNTER — Ambulatory Visit (INDEPENDENT_AMBULATORY_CARE_PROVIDER_SITE_OTHER): Payer: Self-pay | Admitting: Physician Assistant

## 2023-01-08 VITALS — BP 116/77 | HR 96 | Resp 20 | Wt 168.0 lb

## 2023-01-08 DIAGNOSIS — C771 Secondary and unspecified malignant neoplasm of intrathoracic lymph nodes: Secondary | ICD-10-CM | POA: Diagnosis not present

## 2023-01-08 DIAGNOSIS — Z09 Encounter for follow-up examination after completed treatment for conditions other than malignant neoplasm: Secondary | ICD-10-CM

## 2023-01-08 DIAGNOSIS — C3411 Malignant neoplasm of upper lobe, right bronchus or lung: Secondary | ICD-10-CM | POA: Diagnosis not present

## 2023-01-08 MED ORDER — FUROSEMIDE 40 MG PO TABS: 40.0000 mg | ORAL_TABLET | Freq: Every day | ORAL | 0 refills | Status: DC

## 2023-01-08 NOTE — Progress Notes (Signed)
[address]       [name] [address]             [phone]       HPI: Zoe [name] is an 87 year old patient who is S/P Robotic assisted right upper lobe wedge resection by Dr. Cliffton Asters on 10/21. She was seen in our office on 10/30 and was reported to be doing well. She was again seen on 11/07 due to a possible seroma around her incision seen by her PCP. This was found to likely be a small hematoma around one of her incision sites. Surgical pathology showed [id number] squamous cell carcinoma. The patient has been undergoing radiation therapy but did not want chemotherapy or immunotherapy. She is closely followed by oncology. She reports incisional pain and burning consistent with neuropathic pain of the right side. She also complains of shortness of breath with exertion. She denies fever, chills, dizziness and LOC. She states she is having a tough time with radiation therapy and overall does not feel great.  Current Outpatient Medications  Medication Sig Dispense Refill   albuterol (PROVENTIL HFA;VENTOLIN HFA) 108 (90 BASE) MCG/ACT inhaler Inhale 2 puffs into the lungs every 6 (six) hours as needed for wheezing or shortness of breath.     allopurinol (ZYLOPRIM) 300 MG tablet Take 1 tablet (300 mg total) by mouth daily. 90 tablet 1   ALPRAZolam (XANAX) 0.5 MG tablet Take 0.5 mg by mouth 2 (two) times daily as needed for anxiety.     apixaban (ELIQUIS) 5 MG TABS tablet Take 1 tablet (5 mg total) by mouth 2 (two) times daily. 180 tablet 1   APPLE CIDER VINEGAR PO Take 625 mg by mouth daily.     Biotin 5000 MCG TABS Take 5,000-10,000 mcg by mouth daily.     calcium-vitamin D 250-100 MG-UNIT tablet Take 1 tablet by mouth daily.     Cholecalciferol (VITAMIN D-3) 125 MCG (5000 UT) TABS Take 5,000 Units by mouth daily.     Cranberry-Vitamin C (CRANBERRY CONCENTRATE/VITAMINC PO) Take 250 mg by mouth daily.     cyanocobalamin 2000 MCG tablet Take 5,000 mcg by mouth daily.      diclofenac Sodium (VOLTAREN) 1 % GEL Apply 2-4 g topically 4 (four) times daily. APPLY 2-4 GRAMS TOPICALLY FOUR TIMES DAILY Strength: 1 % 200 g 3   docusate sodium (COLACE) 100 MG capsule Take 100 mg by mouth daily.     dorzolamide (TRUSOPT) 2 % ophthalmic solution Place 1 drop into the right eye daily.     doxepin (SINEQUAN) 100 MG capsule Take 1 capsule (100 mg total) by mouth at bedtime. 90 capsule 1   Fluticasone-Umeclidin-Vilant (TRELEGY ELLIPTA) 100-62.5-25 MCG/ACT AEPB Inhale 1 puff into the lungs daily. 60 each 11   gabapentin (NEURONTIN) 300 MG capsule Take 300 mg by mouth 4 (four) times daily as needed (pain).     ibuprofen (ADVIL) 800 MG tablet Take 800 mg by mouth every 6 (six) hours as needed.     lactulose (CHRONULAC) 10 GM/15ML solution 15 ml daily     levocetirizine (XYZAL) 5 MG tablet Take 5 mg by mouth every evening.     lubiprostone (AMITIZA) 24 MCG capsule Take 1 capsule (24 mcg total) by mouth 2 (two) times daily with a meal. 180 capsule 3   magnesium oxide (MAG-OX) 400 (240 Mg) MG tablet Take 400 mg by mouth every Monday, Wednesday, and Friday.     meloxicam (MOBIC) 15 MG  tablet Take 15 mg by mouth daily.     metoprolol tartrate (LOPRESSOR) 25 MG tablet Take 0.5 tablets (12.5 mg total) by mouth 2 (two) times daily. (Patient taking differently: Take 25 mg by mouth at bedtime.) 135 tablet 2   omeprazole (PRILOSEC) 40 MG capsule Take 1 capsule (40 mg total) by mouth daily. 90 capsule 3   Potassium 99 MG TABS Take 99 mg by mouth every Monday, Wednesday, and Friday.     pravastatin (PRAVACHOL) 20 MG tablet Take 20 mg by mouth as directed. 3 x week     prochlorperazine (COMPAZINE) 10 MG tablet Take 1 tablet (10 mg total) by mouth every 6 (six) hours as needed for nausea or vomiting. (Patient not taking: Reported on 12/22/2022) 30 tablet 1   senna (SENOKOT) 8.6 MG tablet Take 5 tablets by mouth 2 (two) times daily.     traMADol (ULTRAM) 50 MG tablet Take 1 tablet (50 mg total) by  mouth every 6 (six) hours as needed for moderate pain (pain score 4-6) (mild pain). (Patient not taking: Reported on 12/22/2022) 28 tablet 0   Travoprost, BAK Free, (TRAVATAN) 0.004 % SOLN ophthalmic solution Place 1 drop into both eyes at bedtime.     traZODone (DESYREL) 50 MG tablet Take 50-100 mg by mouth at bedtime.     No current facility-administered medications for this visit.   Facility-Administered Medications Ordered in Other Visits  Medication Dose Route Frequency Provider Last Rate Last Admin   regadenoson (LEXISCAN) injection SOLN 0.4 mg  0.4 mg Intravenous Once Hilty, Lisette Abu, MD       technetium tetrofosmin (TC-MYOVIEW) injection 31.4 millicurie  31.4 millicurie Intravenous Once PRN Hilty, Lisette Abu, MD       Vitals: Today's Vitals   01/08/23 1458  BP: 116/77  Pulse: 96  Resp: 20  SpO2: 96%  Weight: 168 lb (76.2 kg)   Body mass index is 28.84 kg/m.   Physical Exam: General: Alert and oriented, no acute distress Neuro: Grossly intact CV: Regular rate and rhythm Pulm: Slightly diminished breath sounds at the right base, otherwise clear to auscultation Extremities: No edema Wounds: Well healed incisions, no further hematoma, tender to palpation  Diagnostic Tests: CLINICAL DATA:  Suspicious nodule on chest x-ray; * Tracking Code: BO *   EXAM: CT CHEST WITHOUT CONTRAST   TECHNIQUE: Multidetector CT imaging of the chest was performed following the standard protocol without IV contrast.   RADIATION DOSE REDUCTION: This exam was performed according to the departmental dose-optimization program which includes automated exposure control, adjustment of the mA and/or kV according to patient size and/or use of iterative reconstruction technique.   COMPARISON:  Chest x-ray dated Jun 17, 2022   FINDINGS: Cardiovascular: Normal heart size. Pericardial calcifications. No pericardial effusion. Moderate aortic valve calcifications. Normal caliber thoracic aorta with  moderate calcified plaque.   Mediastinum/Nodes: Esophagus and thyroid are unremarkable. No enlarged lymph nodes seen in the chest.   Lungs/Pleura: Central airways are patent. Mild paraseptal and centrilobular emphysema. Large spiculated solid nodule of the right upper lobe measuring 2.3 x 2.0 cm on series 4, image 52. Small solid pulmonary nodule of the right middle lobe measuring 3 mm. Calcified granuloma of the left upper lobe, likely sequela prior granulomatous infection. No pleural effusion. Small calcified right pleural plaque, likely due to prior hemothorax or empyema.   Upper Abdomen: No acute abnormality.   Musculoskeletal: Age-indeterminate moderate compression deformity of T6. Compression deformities of T11-L1 with vertebroplasty changes. No aggressive appearing  osseous lesions.   IMPRESSION: 1. Large spiculated solid nodule of the right upper lobe, highly concerning for primary lung malignancy. Further evaluation with PET-CT and/or or consultation with Pulmonology or Thoracic Surgery is recommended. 2. Small solid pulmonary nodule of the right middle lobe measuring 3 mm. Recommend attention on follow-up. 3. Age-indeterminate moderate compression deformity of T6. Correlate for point tenderness. 4. Pericardial calcifications, likely due to prior infection or trauma. 5. Moderate aortic valve calcifications, findings can be seen in the setting of aortic stenosis. Correlate with echocardiography. 6. Aortic Atherosclerosis (ICD10-I70.0) and Emphysema (ICD10-J43.9).   These results will be called to the ordering clinician or representative by the Radiologist Assistant, and communication documented in the PACS or [employer].     Electronically Signed   By: Allegra Lai M.D.   On: 07/30/2022 09:05  CLINICAL DATA:  Partial lumpectomy 9 weeks ago, lung nodule.   EXAM: CHEST - 2 VIEW   COMPARISON:  11/19/2022.  CT chest 12/03/2022.   FINDINGS: Interval  removal of the right subclavian central line. Postoperative scarring and slight fullness in the right perihilar region, somewhat improved from 11/19/2022. Minimal subsegmental atelectasis in the lung bases. Small bilateral pleural effusions, right greater than left. No pneumothorax.   IMPRESSION: 1. Postoperative changes in the right hemithorax, better delineated on CT chest 12/03/2022. 2. Minimal bibasilar subsegmental atelectasis with small bilateral pleural effusions.     Electronically Signed   By: Leanna Battles M.D.   On: 01/07/2023 11:38  Impression/Plan: S/P RUL wedge resection: The patient has progressed well from a surgical perspective but states she has not been feeling great due to the radiation therapy. She does admit to continued burning and incisional pain. Her incisions are well healed without sign of hematoma, seroma or infection. She took gabapentin prior to surgery and her prescription is for 300mg  QID PRN but she only takes 300mg  BID. We discussed increasing this to 300mg  TID and trying Tylenol and heat for the area as it sounds like neuropathic pain. This should continue to improve with time. She also endorses shortness of breath with exertion and is overall fatigued. Some of this may be due to radiation therapy. She denies fever and chills. CXR shows small bilateral pleural effusions R>L. She did not want a thoracentesis at this time. We will try Lasix 40mg  x 7days and repeat CXR in 2 weeks. Plan to have the patient follow up in 2 weeks and as needed.   Adenocarcinoma of the lung: The oncologist reviewed the results of the CT scan with the patient. She continues with radiation therapy but declined chemotherapy and immunotherapy and wishes to proceed with supportive care once radiation therapy is complete. The patient is followed closely by oncology.  Zoe Reichmann, PA-C [facility] [phone]

## 2023-01-08 NOTE — Patient Instructions (Addendum)
Start taking gabapentin 300mg  three times per day for neuropathic pain  Take PO Lasix 40mg  daily for 7 days and potassium supplement daily

## 2023-01-09 DIAGNOSIS — C771 Secondary and unspecified malignant neoplasm of intrathoracic lymph nodes: Secondary | ICD-10-CM | POA: Diagnosis not present

## 2023-01-09 DIAGNOSIS — C3411 Malignant neoplasm of upper lobe, right bronchus or lung: Secondary | ICD-10-CM | POA: Diagnosis not present

## 2023-01-12 ENCOUNTER — Emergency Department (HOSPITAL_COMMUNITY): Payer: HMO

## 2023-01-12 ENCOUNTER — Other Ambulatory Visit: Payer: Self-pay

## 2023-01-12 ENCOUNTER — Encounter (HOSPITAL_COMMUNITY): Payer: Self-pay | Admitting: Emergency Medicine

## 2023-01-12 ENCOUNTER — Emergency Department (HOSPITAL_COMMUNITY)
Admission: EM | Admit: 2023-01-12 | Discharge: 2023-01-12 | Disposition: A | Discharge status: Home / Self Care | Payer: HMO | Attending: Emergency Medicine | Admitting: Emergency Medicine

## 2023-01-12 DIAGNOSIS — G8911 Acute pain due to trauma: Secondary | ICD-10-CM | POA: Diagnosis not present

## 2023-01-12 DIAGNOSIS — Z7901 Long term (current) use of anticoagulants: Secondary | ICD-10-CM | POA: Insufficient documentation

## 2023-01-12 DIAGNOSIS — M47816 Spondylosis without myelopathy or radiculopathy, lumbar region: Secondary | ICD-10-CM | POA: Diagnosis not present

## 2023-01-12 DIAGNOSIS — S93401A Sprain of unspecified ligament of right ankle, initial encounter: Secondary | ICD-10-CM | POA: Insufficient documentation

## 2023-01-12 DIAGNOSIS — R918 Other nonspecific abnormal finding of lung field: Secondary | ICD-10-CM | POA: Diagnosis not present

## 2023-01-12 DIAGNOSIS — I4891 Unspecified atrial fibrillation: Secondary | ICD-10-CM | POA: Insufficient documentation

## 2023-01-12 DIAGNOSIS — Z85118 Personal history of other malignant neoplasm of bronchus and lung: Secondary | ICD-10-CM | POA: Insufficient documentation

## 2023-01-12 DIAGNOSIS — M25572 Pain in left ankle and joints of left foot: Secondary | ICD-10-CM | POA: Diagnosis not present

## 2023-01-12 DIAGNOSIS — M7732 Calcaneal spur, left foot: Secondary | ICD-10-CM | POA: Diagnosis not present

## 2023-01-12 DIAGNOSIS — W19XXXA Unspecified fall, initial encounter: Secondary | ICD-10-CM | POA: Insufficient documentation

## 2023-01-12 DIAGNOSIS — Z043 Encounter for examination and observation following other accident: Secondary | ICD-10-CM | POA: Diagnosis not present

## 2023-01-12 DIAGNOSIS — I959 Hypotension, unspecified: Secondary | ICD-10-CM | POA: Diagnosis not present

## 2023-01-12 DIAGNOSIS — M19072 Primary osteoarthritis, left ankle and foot: Secondary | ICD-10-CM | POA: Diagnosis not present

## 2023-01-12 DIAGNOSIS — M7989 Other specified soft tissue disorders: Secondary | ICD-10-CM | POA: Diagnosis not present

## 2023-01-12 DIAGNOSIS — Y92019 Unspecified place in single-family (private) house as the place of occurrence of the external cause: Secondary | ICD-10-CM | POA: Insufficient documentation

## 2023-01-12 DIAGNOSIS — I10 Essential (primary) hypertension: Secondary | ICD-10-CM | POA: Insufficient documentation

## 2023-01-12 DIAGNOSIS — J45909 Unspecified asthma, uncomplicated: Secondary | ICD-10-CM | POA: Diagnosis not present

## 2023-01-12 DIAGNOSIS — M5126 Other intervertebral disc displacement, lumbar region: Secondary | ICD-10-CM | POA: Diagnosis not present

## 2023-01-12 DIAGNOSIS — M438X6 Other specified deforming dorsopathies, lumbar region: Secondary | ICD-10-CM | POA: Diagnosis not present

## 2023-01-12 DIAGNOSIS — M25571 Pain in right ankle and joints of right foot: Secondary | ICD-10-CM | POA: Diagnosis not present

## 2023-01-12 DIAGNOSIS — Z96643 Presence of artificial hip joint, bilateral: Secondary | ICD-10-CM | POA: Diagnosis not present

## 2023-01-12 DIAGNOSIS — S93491A Sprain of other ligament of right ankle, initial encounter: Secondary | ICD-10-CM | POA: Diagnosis not present

## 2023-01-12 LAB — CBC WITH DIFFERENTIAL/PLATELET
Abs Immature Granulocytes: 0.03 10*3/uL (ref 0.00–0.07)
Basophils Absolute: 0 10*3/uL (ref 0.0–0.1)
Basophils Relative: 0 %
Eosinophils Absolute: 0.1 10*3/uL (ref 0.0–0.5)
Eosinophils Relative: 1 %
HCT: 31.3 % — ABNORMAL LOW (ref 36.0–46.0)
Hemoglobin: 10.1 g/dL — ABNORMAL LOW (ref 12.0–15.0)
Immature Granulocytes: 0 %
Lymphocytes Relative: 5 %
Lymphs Abs: 0.4 10*3/uL — ABNORMAL LOW (ref 0.7–4.0)
MCH: 30.8 pg (ref 26.0–34.0)
MCHC: 32.3 g/dL (ref 30.0–36.0)
MCV: 95.4 fL (ref 80.0–100.0)
Monocytes Absolute: 0.5 10*3/uL (ref 0.1–1.0)
Monocytes Relative: 6 %
Neutro Abs: 7.6 10*3/uL (ref 1.7–7.7)
Neutrophils Relative %: 88 %
Platelets: 158 10*3/uL (ref 150–400)
RBC: 3.28 MIL/uL — ABNORMAL LOW (ref 3.87–5.11)
RDW: 13.6 % (ref 11.5–15.5)
WBC: 8.7 10*3/uL (ref 4.0–10.5)
nRBC: 0 % (ref 0.0–0.2)

## 2023-01-12 LAB — COMPREHENSIVE METABOLIC PANEL
ALT: 10 U/L (ref 0–44)
AST: 14 U/L — ABNORMAL LOW (ref 15–41)
Albumin: 3.4 g/dL — ABNORMAL LOW (ref 3.5–5.0)
Alkaline Phosphatase: 63 U/L (ref 38–126)
Anion gap: 11 (ref 5–15)
BUN: 24 mg/dL — ABNORMAL HIGH (ref 8–23)
CO2: 21 mmol/L — ABNORMAL LOW (ref 22–32)
Calcium: 8.7 mg/dL — ABNORMAL LOW (ref 8.9–10.3)
Chloride: 103 mmol/L (ref 98–111)
Creatinine, Ser: 0.71 mg/dL (ref 0.44–1.00)
GFR, Estimated: >60 mL/min (ref >60)
Glucose, Bld: 124 mg/dL — ABNORMAL HIGH (ref 70–99)
Potassium: 3.8 mmol/L (ref 3.5–5.1)
Sodium: 135 mmol/L (ref 135–145)
Total Bilirubin: 0.6 mg/dL (ref <1.2)
Total Protein: 6.4 g/dL — ABNORMAL LOW (ref 6.5–8.1)

## 2023-01-12 LAB — MAGNESIUM: Magnesium: 1.7 mg/dL (ref 1.7–2.4)

## 2023-01-12 MED ORDER — ACETAMINOPHEN 325 MG PO TABS
650.0000 mg | ORAL_TABLET | Freq: Once | ORAL | Status: AC
  Administered 2023-01-12: 650 mg via ORAL
  Filled 2023-01-12: qty 2

## 2023-01-12 NOTE — Discharge Instructions (Addendum)
The x-rays today did not show any fractures.  You have soft tissue injuries of your ankles which will take some time to heal.  You can put weight on your legs, within the tolerance of your pain.  Rest, ice, and elevate when possible.  Take Tylenol as needed for pain.  Follow-up with your orthopedic doctor as needed.  Return to the emergency department for any new or worsening symptoms of concern.

## 2023-01-12 NOTE — ED Triage Notes (Signed)
Pt reports while walking at home her legs gave out and she fell onto her ankles. Pt reports bilateral ankle pain and swelling.

## 2023-01-12 NOTE — ED Provider Notes (Signed)
[place] EMERGENCY DEPARTMENT AT [facility] Provider Note   CSN: [id number] Arrival date & time: 01/12/23  1129     History  Chief Complaint  Patient presents with   Zoe [name] is a 87 y.o. female.  HPI Patient presents after fall.  Medical history includes atrial fibrillation, GERD, fibromyalgia, HTN, HLD, sleep apnea, depression, anxiety, asthma, arthritis, lung cancer s/p RUL wedge resection, IBS.  This morning, she was in her normal state of health.  After using the bathroom, she was walking at home when she had a fall.  She describes the feeling of her legs giving out from under her.  As she fell, her legs folded and she had pain to the lateral aspects of both ankles.  She was not able to stand up and bear weight.  She crawled into her bed and tried to rest.  Pain did not improve.  She denies any other areas of new pain.  She has an low back pain which she believes is chronic.  At baseline, she uses a cane occasionally.  Most of the time, she is able to ambulate without any assistive devices.  She lives with her husband and son.    Home Medications Prior to Admission medications   Medication Sig Start Date End Date Taking? Authorizing Provider  albuterol (PROVENTIL HFA;VENTOLIN HFA) 108 (90 BASE) MCG/ACT inhaler Inhale 2 puffs into the lungs every 6 (six) hours as needed for wheezing or shortness of breath.    [provider]  allopurinol (ZYLOPRIM) 300 MG tablet Take 1 tablet (300 mg total) by mouth daily. 10/02/20   Heather Roberts, NP  ALPRAZolam Prudy Feeler) 0.5 MG tablet Take 0.5 mg by mouth 2 (two) times daily as needed for anxiety.    [provider]  apixaban (ELIQUIS) 5 MG TABS tablet Take 1 tablet (5 mg total) by mouth 2 (two) times daily. 10/02/22   Mallipeddi, Vishnu P, MD  APPLE CIDER VINEGAR PO Take 625 mg by mouth daily.    [provider]  Biotin 5000 MCG TABS Take 5,000-10,000 mcg by mouth daily.    [provider]  calcium-vitamin D 250-100 MG-UNIT tablet Take 1 tablet by mouth daily.    [provider]  Cholecalciferol (VITAMIN D-3) 125 MCG (5000 UT) TABS Take 5,000 Units by mouth daily.    [provider]  Cranberry-Vitamin C (CRANBERRY CONCENTRATE/VITAMINC PO) Take 250 mg by mouth daily.    [provider]  cyanocobalamin 2000 MCG tablet Take 5,000 mcg by mouth daily.    [provider]  diclofenac Sodium (VOLTAREN) 1 % GEL Apply 2-4 g topically 4 (four) times daily. APPLY 2-4 GRAMS TOPICALLY FOUR TIMES DAILY Strength: 1 % 03/06/22   Kathryne Hitch, MD  docusate sodium (COLACE) 100 MG capsule Take 100 mg by mouth daily.    [provider]  dorzolamide (TRUSOPT) 2 % ophthalmic solution Place 1 drop into the right eye daily. 07/28/22   [provider]  doxepin (SINEQUAN) 100 MG capsule Take 1 capsule (100 mg total) by mouth at bedtime. 08/24/20   Heather Roberts, NP  Fluticasone-Umeclidin-Vilant (TRELEGY ELLIPTA) 100-62.5-25 MCG/ACT AEPB Inhale 1 puff into the lungs daily. 08/22/22   Nyoka Cowden, MD  furosemide (LASIX) 40 MG tablet Take 1 tablet (40 mg total) by mouth daily. Take for 7 days then stop 01/08/23   Stehler, Fredric Mare C, PA-C  gabapentin (NEURONTIN) 300 MG capsule Take 300 mg by  mouth 4 (four) times daily as needed (pain).    [provider]  ibuprofen (ADVIL) 800 MG tablet Take 800 mg by mouth every 6 (six) hours as needed.    [provider]  lactulose (CHRONULAC) 10 GM/15ML solution 15 ml daily 11/21/22   [provider]  levocetirizine (XYZAL) 5 MG tablet Take 5 mg by mouth every evening.    [provider]  lubiprostone (AMITIZA) 24 MCG capsule Take 1 capsule (24 mcg total) by mouth 2 (two) times daily with a meal. 11/28/21   Marguerita Merles, Reuel Boom, MD  magnesium oxide (MAG-OX) 400 (240 Mg) MG tablet Take 400 mg by mouth every Monday, Wednesday, and Friday.    [provider]   meloxicam (MOBIC) 15 MG tablet Take 15 mg by mouth daily.    [provider]  metoprolol tartrate (LOPRESSOR) 25 MG tablet Take 0.5 tablets (12.5 mg total) by mouth 2 (two) times daily. Patient taking differently: Take 25 mg by mouth at bedtime. 10/02/22   Mallipeddi, Vishnu P, MD  omeprazole (PRILOSEC) 40 MG capsule Take 1 capsule (40 mg total) by mouth daily. 09/18/22   Dolores Frame, MD  Potassium 99 MG TABS Take 99 mg by mouth every Monday, Wednesday, and Friday.    [provider]  pravastatin (PRAVACHOL) 20 MG tablet Take 20 mg by mouth as directed. 3 x week    [provider]  prochlorperazine (COMPAZINE) 10 MG tablet Take 1 tablet (10 mg total) by mouth every 6 (six) hours as needed for nausea or vomiting. 09/17/22   Si Gaul, MD  senna (SENOKOT) 8.6 MG tablet Take 5 tablets by mouth 2 (two) times daily.    [provider]  traMADol (ULTRAM) 50 MG tablet Take 1 tablet (50 mg total) by mouth every 6 (six) hours as needed for moderate pain (pain score 4-6) (mild pain). Patient not taking: Reported on 01/08/2023 11/19/22   Stehler, Oren Bracket, PA-C  Travoprost, BAK Free, (TRAVATAN) 0.004 % SOLN ophthalmic solution Place 1 drop into both eyes at bedtime.    [provider]  traZODone (DESYREL) 50 MG tablet Take 50-100 mg by mouth at bedtime.    [provider]      Allergies    Codeine, Other, Oxycodone, Paxil [paroxetine hcl], and Procaine    Review of Systems   Review of Systems  Musculoskeletal:  Positive for arthralgias and joint swelling.  All other systems reviewed and are negative.   Physical Exam Updated Vital Signs BP (!) 102/59 (BP Location: Left Arm)   Pulse 77   Resp 15   SpO2 (!) 87%  Physical Exam Vitals and nursing note reviewed.  Constitutional:      General: She is not in acute distress.    Appearance: Normal appearance. She is well-developed. She is not ill-appearing, toxic-appearing or  diaphoretic.  HENT:     Head: Normocephalic and atraumatic.     Right Ear: External ear normal.     Left Ear: External ear normal.     Nose: Nose normal.     Mouth/Throat:     Mouth: Mucous membranes are moist.  Eyes:     Extraocular Movements: Extraocular movements intact.     Conjunctiva/sclera: Conjunctivae normal.  Cardiovascular:     Rate and Rhythm: Normal rate and regular rhythm.     Heart sounds: No murmur heard. Pulmonary:     Effort: Pulmonary effort is normal. No respiratory distress.  Chest:     Chest  wall: No tenderness.  Abdominal:     General: There is no distension.     Palpations: Abdomen is soft.     Tenderness: There is no abdominal tenderness.  Musculoskeletal:        General: Swelling, tenderness and signs of injury present.     Cervical back: Normal range of motion and neck supple.  Skin:    General: Skin is warm and dry.     Coloration: Skin is not jaundiced or pale.  Neurological:     General: No focal deficit present.     Mental Status: She is alert and oriented to person, place, and time.  Psychiatric:        Mood and Affect: Mood normal.        Behavior: Behavior normal.     ED Results / Procedures / Treatments   Labs (all labs ordered are listed, but only abnormal results are displayed) Labs Reviewed  COMPREHENSIVE METABOLIC PANEL - Abnormal; Notable for the following components:      Result Value   CO2 21 (*)    Glucose, Bld 124 (*)    BUN 24 (*)    Calcium 8.7 (*)    Total Protein 6.4 (*)    Albumin 3.4 (*)    AST 14 (*)    All other components within normal limits  CBC WITH DIFFERENTIAL/PLATELET - Abnormal; Notable for the following components:   RBC 3.28 (*)    Hemoglobin 10.1 (*)    HCT 31.3 (*)    Lymphs Abs 0.4 (*)    All other components within normal limits  MAGNESIUM    EKG EKG Interpretation Date/Time:  Monday January 12 2023 11:38:55 EST Ventricular Rate:  86 PR Interval:  195 QRS Duration:  92 QT  Interval:  361 QTC Calculation: 432 R Axis:   11  Text Interpretation: Sinus rhythm Confirmed by Gloris Manchester [phone]) on 01/12/2023 11:45:40 AM  Radiology DG Lumbar Spine 2-3 Views Result Date: 01/12/2023 CLINICAL DATA:  Fall. EXAM: LUMBAR SPINE - 2-3 VIEW COMPARISON:  05/23/2020. FINDINGS: There are 5 nonrib-bearing lumbar vertebrae. Anatomic lumbar curvature. There is minimal/grade 1 retrolisthesis of L1 over L2, L2 over L3 and L3 over L4. Mild loss of height of T11 and T12 vertebral bodies noted. T11-L1 vertebroplasty noted. Remaining vertebral body heights are maintained. No aggressive osseous lesion. Moderate multilevel degenerative changes in the form of reduced intervertebral disc height, endplate sclerosis/irregularity, facet arthropathy and marginal osteophyte formation. Sacroiliac joints are symmetric. Visualized soft tissues are within normal limits. IMPRESSION: *No acute osseous abnormality of the lumbar spine. *Moderate degenerative changes, as described above. Electronically Signed   By: Jules Schick M.D.   On: 01/12/2023 13:25   DG Pelvis Portable Result Date: 01/12/2023 CLINICAL DATA:  Fall. EXAM: PORTABLE PELVIS 1-2 VIEWS COMPARISON:  07/15/2013. FINDINGS: Pelvis is intact with normal and symmetric sacroiliac joints. No acute fracture or dislocation. No aggressive osseous lesion. Visualized sacral arcuate lines are unremarkable. Unremarkable symphysis pubis. Bilateral hip arthroplasty noted. The hardware is intact. No periprosthetic fracture or lucency. No radiopaque foreign bodies. IMPRESSION: *No acute osseous injury of the pelvis. Electronically Signed   By: Jules Schick M.D.   On: 01/12/2023 13:24   DG Chest Portable 1 View Result Date: 01/12/2023 CLINICAL DATA:  Fall. EXAM: PORTABLE CHEST 1 VIEW COMPARISON:  01/07/2023. FINDINGS: Redemonstration of nonspecific heterogeneous as well as linear opacities overlapping the right hilum. When compared to the recent radiograph, the  opacities appear more pronounced however, findings  are likely secondary to patient rotation. Correlate clinically to determine the need for additional imaging with more sensitive modality such as chest CT scan. Bilateral lung fields are otherwise clear. Bilateral costophrenic angles are clear. No pneumothorax. Stable subcentimeter sized calcified granulomas overlying the left lower lung zone and right mid lung zone. Stable cardio-mediastinal silhouette. No acute osseous abnormalities. No acute displaced rib fracture seen. The soft tissues are within normal limits. IMPRESSION: *No acute traumatic injury seen to the chest. *Right hilar opacity, as described above. Electronically Signed   By: Jules Schick M.D.   On: 01/12/2023 13:22   DG Ankle Complete Left Result Date: 01/12/2023 CLINICAL DATA:  Fall.  Bilateral ankle pain and swelling. EXAM: LEFT ANKLE COMPLETE - 3+ VIEW; RIGHT ANKLE - COMPLETE 3+ VIEW COMPARISON:  None Available. FINDINGS: There is diffuse osteopenia of the visualized osseous structures. No acute fracture or dislocation. No aggressive osseous lesion. There is probable old healed avulsion fracture of the tip of the left lateral malleolus. Ankle mortise appears intact. There are mild-to-moderate degenerative changes of bilateral ankle joints. Bilateral calcaneal spur noted along the Plantar aponeurosis attachment site. There is asymmetric soft tissue swelling overlying the right lateral malleolus without discrete underlying fracture, concerning for soft tissue/ligamentous injury. No radiopaque foreign bodies. IMPRESSION: *No acute osseous abnormality of bilateral ankle joints. *Asymmetric soft tissue swelling overlying the right lateral malleolus without discrete underlying fracture, concerning for soft tissue/ligamentous injury. Electronically Signed   By: Jules Schick M.D.   On: 01/12/2023 13:17   DG Ankle Complete Right Result Date: 01/12/2023 CLINICAL DATA:  Fall.  Bilateral ankle pain  and swelling. EXAM: LEFT ANKLE COMPLETE - 3+ VIEW; RIGHT ANKLE - COMPLETE 3+ VIEW COMPARISON:  None Available. FINDINGS: There is diffuse osteopenia of the visualized osseous structures. No acute fracture or dislocation. No aggressive osseous lesion. There is probable old healed avulsion fracture of the tip of the left lateral malleolus. Ankle mortise appears intact. There are mild-to-moderate degenerative changes of bilateral ankle joints. Bilateral calcaneal spur noted along the Plantar aponeurosis attachment site. There is asymmetric soft tissue swelling overlying the right lateral malleolus without discrete underlying fracture, concerning for soft tissue/ligamentous injury. No radiopaque foreign bodies. IMPRESSION: *No acute osseous abnormality of bilateral ankle joints. *Asymmetric soft tissue swelling overlying the right lateral malleolus without discrete underlying fracture, concerning for soft tissue/ligamentous injury. Electronically Signed   By: Jules Schick M.D.   On: 01/12/2023 13:17    Procedures Procedures    Medications Ordered in ED Medications  acetaminophen (TYLENOL) tablet 650 mg (650 mg Oral Given 01/12/23 1255)    ED Course/ Medical Decision Making/ A&P                                 Medical Decision Making Amount and/or Complexity of Data Reviewed Labs: ordered. Radiology: ordered.  Risk OTC drugs.   This patient presents to the ED for concern of fall, this involves an extensive number of treatment options, and is a complaint that carries with it a high risk of complications and morbidity.  The differential diagnosis includes acute injuries   Co morbidities that complicate the patient evaluation  atrial fibrillation, GERD, fibromyalgia, HTN, HLD, sleep apnea, depression, anxiety, asthma, arthritis, lung cancer s/p RUL wedge resection, IBS   Additional history obtained:  Additional history obtained from N/A External records from outside source obtained and  reviewed including EMR   Lab Tests:  I Ordered, and personally interpreted labs.  The pertinent results include: Normal kidney function, normal electrolytes, baseline anemia, no leukocytosis   Imaging Studies ordered:  I ordered imaging studies including x-rays of the chest, pelvis, lumbar spine, bilateral ankles I independently visualized and interpreted imaging which showed no acute osseous injuries I agree with the radiologist interpretation   Cardiac Monitoring: / EKG:  The patient was maintained on a cardiac monitor.  I personally viewed and interpreted the cardiac monitored which showed an underlying rhythm of: Sinus rhythm   Problem List / ED Course / Critical interventions / Medication management  Patient presents after a mechanical fall.  During this fall, her legs folded underneath her and she had injuries to both ankles.  She denies striking her head.  She has not been able to put weight on her ankle since the fall, which occurred 3 hours prior to arrival.  Lateral swelling is noted to right ankle.  She has lateral tenderness to both ankles.  X-ray imaging was ordered.  Tylenol was ordered for analgesia.  Physical exam is otherwise only remarkable for some tenderness in L5 area.  Patient states that this is chronic for her.  Patient underwent x-ray imaging which did not show any acute osseous findings.  I suspect soft tissue injury of bilateral ankles, greater on the right.  A cam boot was ordered for the right.  Ace wrap was ordered for the left.  Patient was advised to weight-bear as tolerated and RICE therapy was advised.  Patient currently has husband and son at home, who will be able to help her with mobility.  She was discharged in stable condition. I ordered medication including Tylenol for analgesia Reevaluation of the patient after these medicines showed that the patient improved I have reviewed the patients home medicines and have made adjustments as needed   Social  Determinants of Health:  Has access to outpatient care        Final Clinical Impression(s) / ED Diagnoses Final diagnoses:  Fall, initial encounter  Sprain of right ankle, unspecified ligament, initial encounter    Rx / DC Orders ED Discharge Orders     None         Gloris Manchester, MD 01/12/23 1510

## 2023-01-13 ENCOUNTER — Telehealth: Payer: Self-pay | Admitting: *Deleted

## 2023-01-13 NOTE — Telephone Encounter (Signed)
Patient contacted the office stating she fell yesterday. States fall came out of "no where". Does not recall anything contributing to fall. States she has been on lasix for that past 2-3 days. Patient called asking if she can stop the lasix. Per Webb Laws, PA, advised patient she can stop medication. Patient will follow up in our clinic on 1/2 with a repeat cxr. Patient verbalized understanding.

## 2023-01-14 DIAGNOSIS — C771 Secondary and unspecified malignant neoplasm of intrathoracic lymph nodes: Secondary | ICD-10-CM | POA: Diagnosis not present

## 2023-01-14 DIAGNOSIS — C3411 Malignant neoplasm of upper lobe, right bronchus or lung: Secondary | ICD-10-CM | POA: Diagnosis not present

## 2023-01-15 DIAGNOSIS — C771 Secondary and unspecified malignant neoplasm of intrathoracic lymph nodes: Secondary | ICD-10-CM | POA: Diagnosis not present

## 2023-01-15 DIAGNOSIS — C3411 Malignant neoplasm of upper lobe, right bronchus or lung: Secondary | ICD-10-CM | POA: Diagnosis not present

## 2023-01-16 DIAGNOSIS — C3411 Malignant neoplasm of upper lobe, right bronchus or lung: Secondary | ICD-10-CM | POA: Diagnosis not present

## 2023-01-16 DIAGNOSIS — C771 Secondary and unspecified malignant neoplasm of intrathoracic lymph nodes: Secondary | ICD-10-CM | POA: Diagnosis not present

## 2023-01-19 DIAGNOSIS — C3411 Malignant neoplasm of upper lobe, right bronchus or lung: Secondary | ICD-10-CM | POA: Diagnosis not present

## 2023-01-19 DIAGNOSIS — C771 Secondary and unspecified malignant neoplasm of intrathoracic lymph nodes: Secondary | ICD-10-CM | POA: Diagnosis not present

## 2023-01-20 DIAGNOSIS — C771 Secondary and unspecified malignant neoplasm of intrathoracic lymph nodes: Secondary | ICD-10-CM | POA: Diagnosis not present

## 2023-01-20 DIAGNOSIS — C3411 Malignant neoplasm of upper lobe, right bronchus or lung: Secondary | ICD-10-CM | POA: Diagnosis not present

## 2023-01-22 ENCOUNTER — Ambulatory Visit (HOSPITAL_COMMUNITY)
Admission: RE | Admit: 2023-01-22 | Discharge: 2023-01-22 | Disposition: A | Discharge status: Home / Self Care | Payer: HMO | Source: Ambulatory Visit | Attending: Thoracic Surgery (Cardiothoracic Vascular Surgery) | Admitting: Thoracic Surgery (Cardiothoracic Vascular Surgery)

## 2023-01-22 DIAGNOSIS — C771 Secondary and unspecified malignant neoplasm of intrathoracic lymph nodes: Secondary | ICD-10-CM | POA: Diagnosis not present

## 2023-01-22 DIAGNOSIS — Z902 Acquired absence of lung [part of]: Secondary | ICD-10-CM | POA: Insufficient documentation

## 2023-01-22 DIAGNOSIS — C3490 Malignant neoplasm of unspecified part of unspecified bronchus or lung: Secondary | ICD-10-CM | POA: Diagnosis not present

## 2023-01-22 DIAGNOSIS — I700 Atherosclerosis of aorta: Secondary | ICD-10-CM | POA: Diagnosis not present

## 2023-01-22 DIAGNOSIS — C3411 Malignant neoplasm of upper lobe, right bronchus or lung: Secondary | ICD-10-CM | POA: Diagnosis not present

## 2023-01-22 NOTE — Progress Notes (Signed)
 [address]       [place] [id number]             [phone]      HPI: [name] is an 87 year old patient who is S/P Robotic assisted right upper lobe wedge resection by Dr. Shyrl on 10/21. She was seen in our office on 10/30 and was reported to be doing well. She was again seen on 11/07 due to a possible seroma around her incision seen by her PCP. This was found to likely be a small hematoma around one of her incision sites. She was again seen on 12/12 for routine 1 month follow up and the hematoma had resolved. She did continue to complain of incision pain and burning and Gabapentin  was increased from BID to TID. She also complained of exertional shortness of breath with small bilateral pleural effusions and was started on 1 weeks worth of Lasix . About 4 days later she fell in her home after feeling like her legs collapsed from under her. She went to the ED and sustained an ankle sprain. She stopped her Lasix  after this. CXR in the ED showed resolution of pleural effusions. Today she denies further falls and dizziness, and her incisional pain is improving although she never increased her Gabapentin  frequency. She admits to a dry cough at times but denies shortness of breath, fever and chills. She admits that she is more mobile and hoping this will improve once her leg feels better. She has undergone 20 radiation treatments and has 10 more and has felt she is sleeping a lot more lately due to that.   Surgical pathology showed [id number] squamous cell carcinoma. The patient has been undergoing radiation therapy but did not want chemotherapy or immunotherapy. She is closely followed by oncology.   Current Outpatient Medications  Medication Sig Dispense Refill   albuterol  (PROVENTIL  HFA;VENTOLIN  HFA) 108 (90 BASE) MCG/ACT inhaler Inhale 2 puffs into the lungs every 6 (six) hours as needed for wheezing or shortness of breath.     allopurinol  (ZYLOPRIM ) 300 MG tablet Take 1  tablet (300 mg total) by mouth daily. 90 tablet 1   ALPRAZolam  (XANAX ) 0.5 MG tablet Take 0.5 mg by mouth 2 (two) times daily as needed for anxiety.     apixaban  (ELIQUIS ) 5 MG TABS tablet Take 1 tablet (5 mg total) by mouth 2 (two) times daily. 180 tablet 1   APPLE CIDER VINEGAR PO Take 625 mg by mouth daily.     Biotin  5000 MCG TABS Take 5,000-10,000 mcg by mouth daily.     calcium-vitamin D  250-100 MG-UNIT tablet Take 1 tablet by mouth daily.     Cholecalciferol (VITAMIN D -3) 125 MCG (5000 UT) TABS Take 5,000 Units by mouth daily.     Cranberry-Vitamin C (CRANBERRY CONCENTRATE/VITAMINC PO) Take 250 mg by mouth daily.     cyanocobalamin  2000 MCG tablet Take 5,000 mcg by mouth daily.     diclofenac  Sodium (VOLTAREN ) 1 % GEL Apply 2-4 g topically 4 (four) times daily. APPLY 2-4 GRAMS TOPICALLY FOUR TIMES DAILY Strength: 1 % 200 g 3   docusate sodium  (COLACE) 100 MG capsule Take 100 mg by mouth daily.     dorzolamide  (TRUSOPT ) 2 % ophthalmic solution Place 1 drop into the right eye daily.     doxepin  (SINEQUAN ) 100 MG capsule Take 1 capsule (100 mg total) by mouth at bedtime. 90 capsule 1   Fluticasone -Umeclidin-Vilant (TRELEGY ELLIPTA ) 100-62.5-25 MCG/ACT AEPB Inhale  1 puff into the lungs daily. 60 each 11   furosemide  (LASIX ) 40 MG tablet Take 1 tablet (40 mg total) by mouth daily. Take for 7 days then stop 7 tablet 0   gabapentin  (NEURONTIN ) 300 MG capsule Take 300 mg by mouth 4 (four) times daily as needed (pain).     ibuprofen  (ADVIL ) 800 MG tablet Take 800 mg by mouth every 6 (six) hours as needed.     lactulose  (CHRONULAC ) 10 GM/15ML solution 15 ml daily     levocetirizine (XYZAL) 5 MG tablet Take 5 mg by mouth every evening.     lubiprostone  (AMITIZA ) 24 MCG capsule Take 1 capsule (24 mcg total) by mouth 2 (two) times daily with a meal. 180 capsule 3   magnesium  oxide (MAG-OX) 400 (240 Mg) MG tablet Take 400 mg by mouth every Monday, Wednesday, and Friday.     meloxicam  (MOBIC ) 15 MG  tablet Take 15 mg by mouth daily.     metoprolol  tartrate (LOPRESSOR ) 25 MG tablet Take 0.5 tablets (12.5 mg total) by mouth 2 (two) times daily. (Patient taking differently: Take 25 mg by mouth at bedtime.) 135 tablet 2   omeprazole  (PRILOSEC) 40 MG capsule Take 1 capsule (40 mg total) by mouth daily. 90 capsule 3   Potassium 99 MG TABS Take 99 mg by mouth every Monday, Wednesday, and Friday.     pravastatin  (PRAVACHOL ) 20 MG tablet Take 20 mg by mouth as directed. 3 x week     prochlorperazine  (COMPAZINE ) 10 MG tablet Take 1 tablet (10 mg total) by mouth every 6 (six) hours as needed for nausea or vomiting. 30 tablet 1   senna (SENOKOT) 8.6 MG tablet Take 5 tablets by mouth 2 (two) times daily.     traMADol  (ULTRAM ) 50 MG tablet Take 1 tablet (50 mg total) by mouth every 6 (six) hours as needed for moderate pain (pain score 4-6) (mild pain). (Patient not taking: Reported on 01/08/2023) 28 tablet 0   Travoprost, BAK Free, (TRAVATAN) 0.004 % SOLN ophthalmic solution Place 1 drop into both eyes at bedtime.     traZODone  (DESYREL ) 50 MG tablet Take 50-100 mg by mouth at bedtime.     No current facility-administered medications for this visit.   Facility-Administered Medications Ordered in Other Visits  Medication Dose Route Frequency Provider Last Rate Last Admin   regadenoson  (LEXISCAN ) injection SOLN 0.4 mg  0.4 mg Intravenous Once Hilty, Kenneth C, MD       technetium tetrofosmin  (TC-MYOVIEW ) injection 31.4 millicurie  31.4 millicurie Intravenous Once PRN Hilty, Vinie BROCKS, MD       Vitals: Today's Vitals   01/29/23 1502  BP: 104/70  Pulse: 100  Resp: 20  SpO2: 97%  Weight: 168 lb (76.2 kg)  Height: 5' 4 (1.626 m)   Body mass index is 28.84 kg/m.  Physical Exam: General: Alert and oriented, no acute distress Neuro: Grossly intact CV: Regular rate and rhythm, no murmur Pulm: Clear to auscultation bilaterally GI: +BS, nontender, no distension Extremities: No edema Wound: Well  healed without sign of infection  Diagnostic Tests: CLINICAL DATA:  Lung cancer.   EXAM: CHEST - 2 VIEW   COMPARISON:  Chest radiograph dated 01/12/2023.   FINDINGS: There are bibasilar atelectasis/scarring. Right perihilar postsurgical changes. No focal consolidation, pleural effusion, pneumothorax. Small calcified granuloma in the lingula. The cardiac silhouette is within limits. Atherosclerotic calcification of the aorta. No acute osseous pathology. Degenerative changes spine. Old lower thoracic compression fractures and vertebroplasty.  IMPRESSION: 1. No active cardiopulmonary disease. 2. Right perihilar postsurgical changes.     Electronically Signed   By: Vanetta Chou M.D.   On: 01/22/2023 15:57  Impression/Plan: S/P RUL wedge resection: The patient continues to progress from a RUL wedge resection. She is now recovering from an ankle sprain. Her incisional pain has improved and she continues to take Gabapentin  BID instead of TID. She has not had any further falls and dizziness has resolved and her mobility is improving. She has a follow up appointment with an orthopedist to examine her ankle sprain on January 20th. CXR shows resolved pleural effusions and she denies shortness of breath. She does admit to being fatigued likely due to radiation treatment. She will continue to follow up with oncology. Continue current medications. Plan to have the patient return to the clinic PRN.   Con JAYSON Helm, PA-C [facility] [phone]

## 2023-01-23 ENCOUNTER — Other Ambulatory Visit: Payer: Self-pay | Admitting: Thoracic Surgery (Cardiothoracic Vascular Surgery)

## 2023-01-23 DIAGNOSIS — I10 Essential (primary) hypertension: Secondary | ICD-10-CM | POA: Diagnosis not present

## 2023-01-23 DIAGNOSIS — S99911S Unspecified injury of right ankle, sequela: Secondary | ICD-10-CM | POA: Diagnosis not present

## 2023-01-23 DIAGNOSIS — R053 Chronic cough: Secondary | ICD-10-CM | POA: Diagnosis not present

## 2023-01-23 DIAGNOSIS — Z902 Acquired absence of lung [part of]: Secondary | ICD-10-CM

## 2023-01-23 DIAGNOSIS — Z299 Encounter for prophylactic measures, unspecified: Secondary | ICD-10-CM | POA: Diagnosis not present

## 2023-01-26 DIAGNOSIS — C3411 Malignant neoplasm of upper lobe, right bronchus or lung: Secondary | ICD-10-CM | POA: Diagnosis not present

## 2023-01-26 DIAGNOSIS — C771 Secondary and unspecified malignant neoplasm of intrathoracic lymph nodes: Secondary | ICD-10-CM | POA: Diagnosis not present

## 2023-01-27 DIAGNOSIS — C771 Secondary and unspecified malignant neoplasm of intrathoracic lymph nodes: Secondary | ICD-10-CM | POA: Diagnosis not present

## 2023-01-27 DIAGNOSIS — C3411 Malignant neoplasm of upper lobe, right bronchus or lung: Secondary | ICD-10-CM | POA: Diagnosis not present

## 2023-01-29 ENCOUNTER — Encounter: Payer: Self-pay | Admitting: Physician Assistant

## 2023-01-29 ENCOUNTER — Ambulatory Visit (INDEPENDENT_AMBULATORY_CARE_PROVIDER_SITE_OTHER): Payer: Self-pay | Admitting: Physician Assistant

## 2023-01-29 VITALS — BP 104/70 | HR 100 | Resp 20 | Ht 64.0 in | Wt 168.0 lb

## 2023-01-29 DIAGNOSIS — C3411 Malignant neoplasm of upper lobe, right bronchus or lung: Secondary | ICD-10-CM

## 2023-01-29 DIAGNOSIS — C771 Secondary and unspecified malignant neoplasm of intrathoracic lymph nodes: Secondary | ICD-10-CM | POA: Diagnosis not present

## 2023-01-29 DIAGNOSIS — C3491 Malignant neoplasm of unspecified part of right bronchus or lung: Secondary | ICD-10-CM | POA: Diagnosis not present

## 2023-01-29 DIAGNOSIS — Z09 Encounter for follow-up examination after completed treatment for conditions other than malignant neoplasm: Secondary | ICD-10-CM

## 2023-01-30 DIAGNOSIS — C3411 Malignant neoplasm of upper lobe, right bronchus or lung: Secondary | ICD-10-CM | POA: Diagnosis not present

## 2023-01-30 DIAGNOSIS — C771 Secondary and unspecified malignant neoplasm of intrathoracic lymph nodes: Secondary | ICD-10-CM | POA: Diagnosis not present

## 2023-02-02 DIAGNOSIS — C771 Secondary and unspecified malignant neoplasm of intrathoracic lymph nodes: Secondary | ICD-10-CM | POA: Diagnosis not present

## 2023-02-02 DIAGNOSIS — C3411 Malignant neoplasm of upper lobe, right bronchus or lung: Secondary | ICD-10-CM | POA: Diagnosis not present

## 2023-02-03 DIAGNOSIS — C3411 Malignant neoplasm of upper lobe, right bronchus or lung: Secondary | ICD-10-CM | POA: Diagnosis not present

## 2023-02-03 DIAGNOSIS — C771 Secondary and unspecified malignant neoplasm of intrathoracic lymph nodes: Secondary | ICD-10-CM | POA: Diagnosis not present

## 2023-02-04 DIAGNOSIS — C771 Secondary and unspecified malignant neoplasm of intrathoracic lymph nodes: Secondary | ICD-10-CM | POA: Diagnosis not present

## 2023-02-04 DIAGNOSIS — C3411 Malignant neoplasm of upper lobe, right bronchus or lung: Secondary | ICD-10-CM | POA: Diagnosis not present

## 2023-02-05 DIAGNOSIS — C771 Secondary and unspecified malignant neoplasm of intrathoracic lymph nodes: Secondary | ICD-10-CM | POA: Diagnosis not present

## 2023-02-05 DIAGNOSIS — C3411 Malignant neoplasm of upper lobe, right bronchus or lung: Secondary | ICD-10-CM | POA: Diagnosis not present

## 2023-02-06 DIAGNOSIS — C771 Secondary and unspecified malignant neoplasm of intrathoracic lymph nodes: Secondary | ICD-10-CM | POA: Diagnosis not present

## 2023-02-06 DIAGNOSIS — C3411 Malignant neoplasm of upper lobe, right bronchus or lung: Secondary | ICD-10-CM | POA: Diagnosis not present

## 2023-02-09 DIAGNOSIS — C771 Secondary and unspecified malignant neoplasm of intrathoracic lymph nodes: Secondary | ICD-10-CM | POA: Diagnosis not present

## 2023-02-09 DIAGNOSIS — C3411 Malignant neoplasm of upper lobe, right bronchus or lung: Secondary | ICD-10-CM | POA: Diagnosis not present

## 2023-02-10 DIAGNOSIS — I4811 Longstanding persistent atrial fibrillation: Secondary | ICD-10-CM | POA: Diagnosis not present

## 2023-02-10 DIAGNOSIS — C3491 Malignant neoplasm of unspecified part of right bronchus or lung: Secondary | ICD-10-CM | POA: Diagnosis not present

## 2023-02-10 DIAGNOSIS — K219 Gastro-esophageal reflux disease without esophagitis: Secondary | ICD-10-CM | POA: Diagnosis not present

## 2023-02-10 DIAGNOSIS — C3411 Malignant neoplasm of upper lobe, right bronchus or lung: Secondary | ICD-10-CM | POA: Diagnosis not present

## 2023-02-10 DIAGNOSIS — J449 Chronic obstructive pulmonary disease, unspecified: Secondary | ICD-10-CM | POA: Diagnosis not present

## 2023-02-10 DIAGNOSIS — C3490 Malignant neoplasm of unspecified part of unspecified bronchus or lung: Secondary | ICD-10-CM | POA: Diagnosis not present

## 2023-02-10 DIAGNOSIS — C771 Secondary and unspecified malignant neoplasm of intrathoracic lymph nodes: Secondary | ICD-10-CM | POA: Diagnosis not present

## 2023-02-10 DIAGNOSIS — M797 Fibromyalgia: Secondary | ICD-10-CM | POA: Diagnosis not present

## 2023-02-11 DIAGNOSIS — C771 Secondary and unspecified malignant neoplasm of intrathoracic lymph nodes: Secondary | ICD-10-CM | POA: Diagnosis not present

## 2023-02-11 DIAGNOSIS — C3411 Malignant neoplasm of upper lobe, right bronchus or lung: Secondary | ICD-10-CM | POA: Diagnosis not present

## 2023-02-11 DIAGNOSIS — C3491 Malignant neoplasm of unspecified part of right bronchus or lung: Secondary | ICD-10-CM | POA: Diagnosis not present

## 2023-02-12 DIAGNOSIS — C3411 Malignant neoplasm of upper lobe, right bronchus or lung: Secondary | ICD-10-CM | POA: Diagnosis not present

## 2023-02-12 DIAGNOSIS — C771 Secondary and unspecified malignant neoplasm of intrathoracic lymph nodes: Secondary | ICD-10-CM | POA: Diagnosis not present

## 2023-02-12 IMAGING — RF DG SWALLOWING FUNCTION
11 series · 13 of 24 positions shown · non-contrast
Comparison: Esophagram 07/13/2020

CLINICAL DATA: Dysphagia. GE reflux disease

EXAM:
MODIFIED BARIUM SWALLOW
TECHNIQUE: Different consistencies of barium were administered orally to the
patient by the Speech Pathologist. Imaging of the pharynx was
performed in the lateral projection. The radiologist was present in
the fluoroscopy room for this study, providing personal supervision.
FLUOROSCOPY TIME:  Fluoroscopy Time: 1 minute 18 seconds
Radiation Exposure Index (if provided by the fluoroscopic device):
11.5 mGy
Number of Acquired Spot Images: multiple fluoroscopic screen
captures

[Series 1: before po · 1 of 27 frames shown]
[frame 5/27]
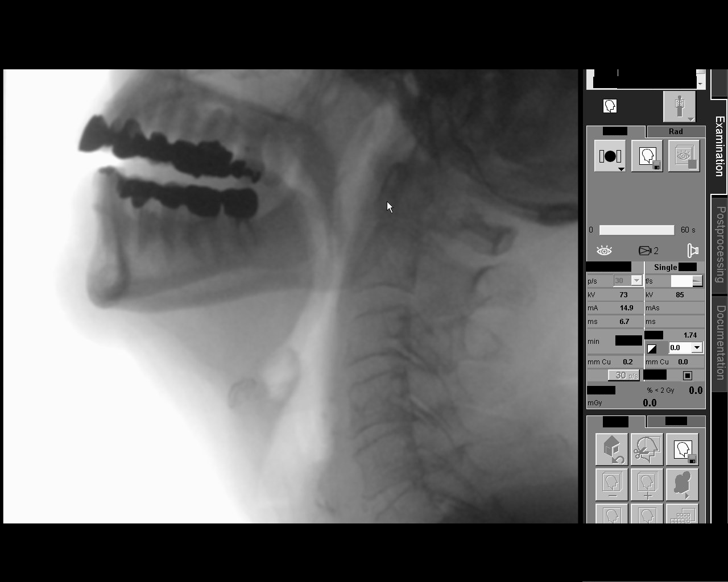

[Series 2: tsp thin · 2 of 299 frames shown (1 of 3)]
[frame 45/299]
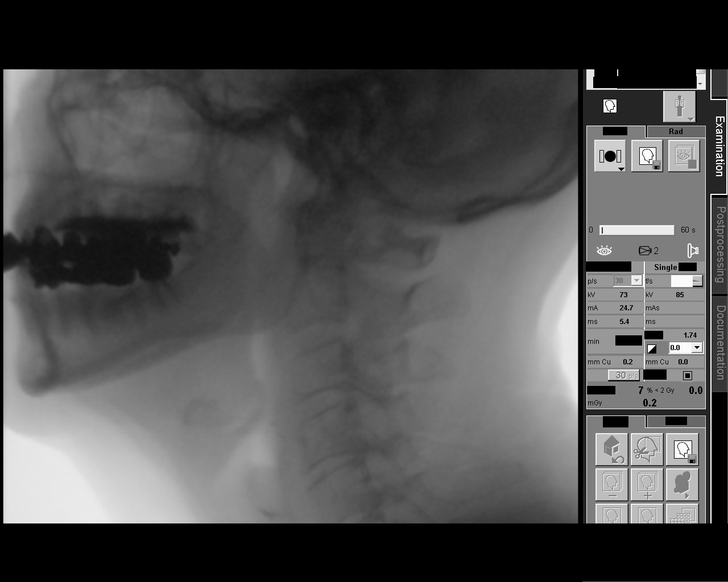
[frame 255/299]
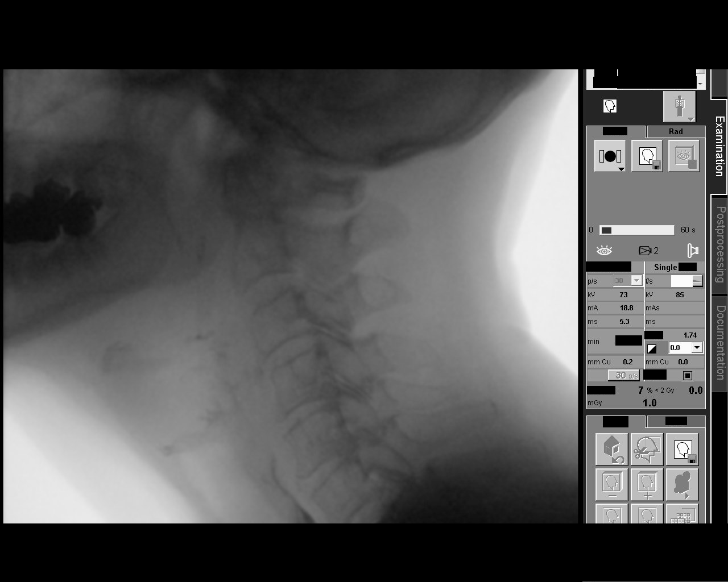

[Series 3: tsp thin · 1 of 154 frames shown (2 of 3)]
[frame 131/154]
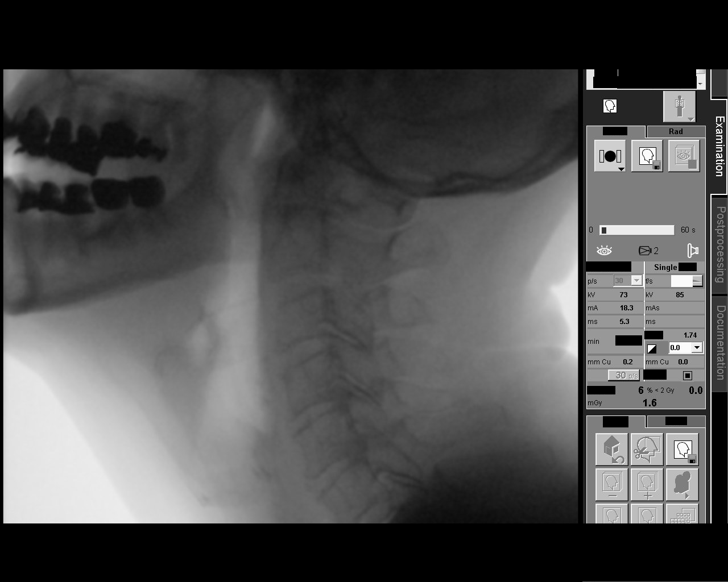

[Series 4: tsp thin · 1 of 121 frames shown (3 of 3)]
[frame 103/121]
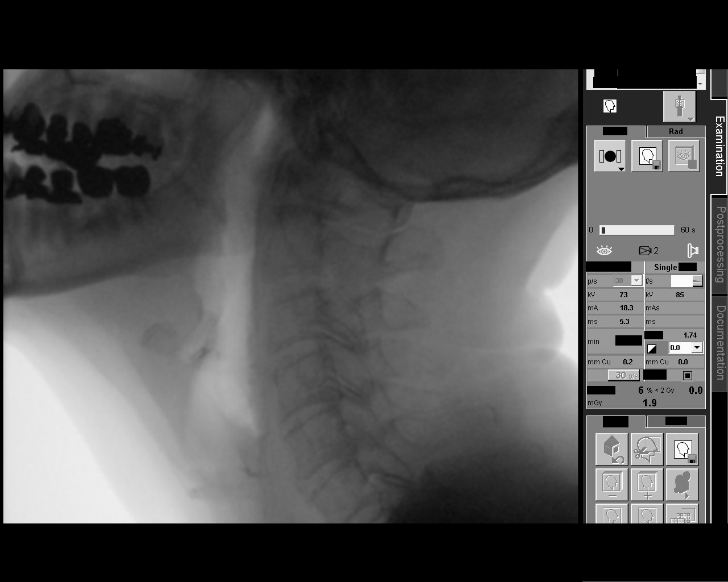

[Series 5: cup sip thin · 1 of 133 frames shown]
[frame 114/133]
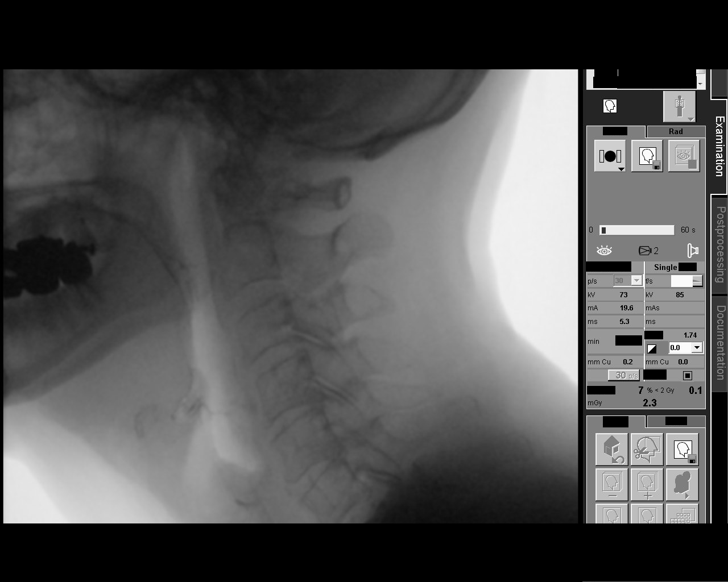

[Series 6: straw thin · 1 of 436 frames shown]
[frame 219/436]
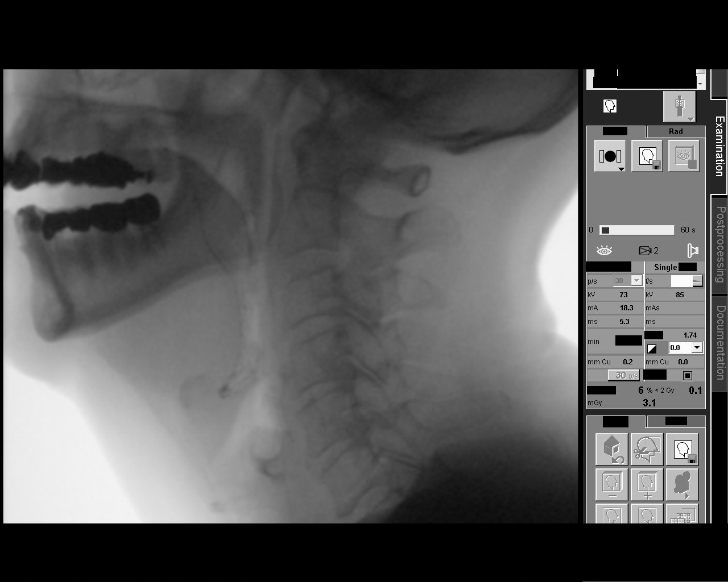

[Series 7: puree · 1 of 244 frames shown (1 of 2)]
[frame 37/244]
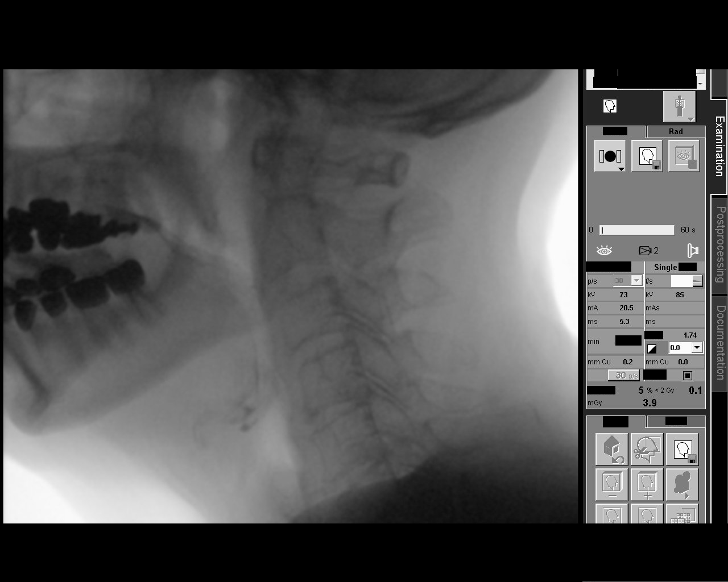

[Series 8: regular · 1 of 314 frames shown]
[frame 48/314]
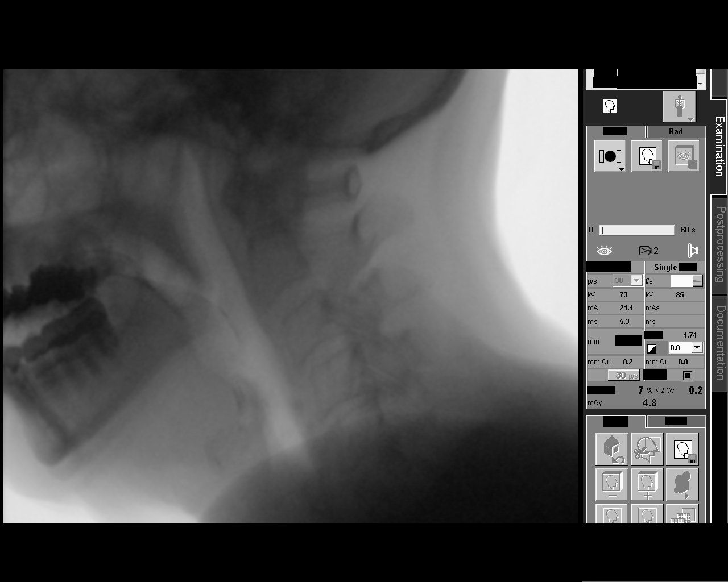

[Series 9: barium tab thin cup · 1 of 229 frames shown]
[frame 23/229]
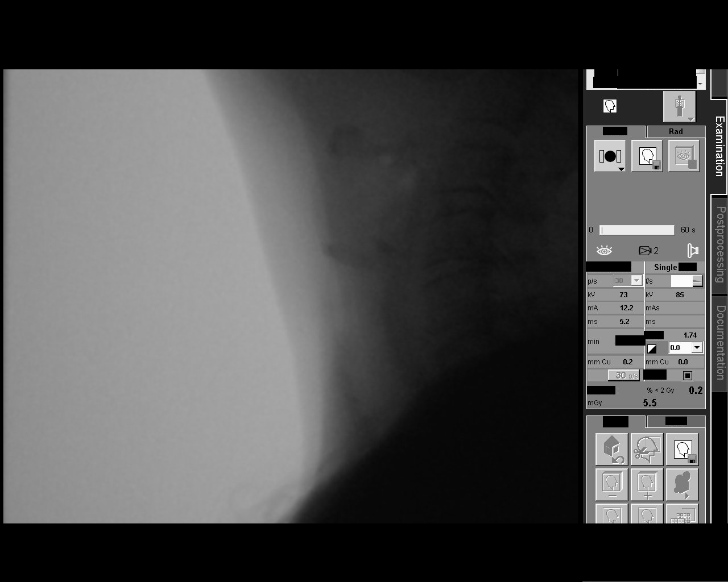

[Series 10: sweep · 2 of 230 frames shown]
[frame 35/230]
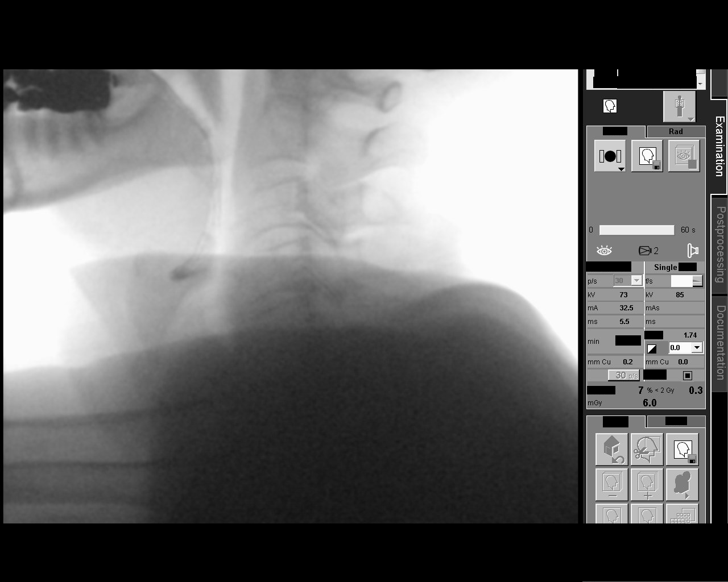
[frame 196/230]
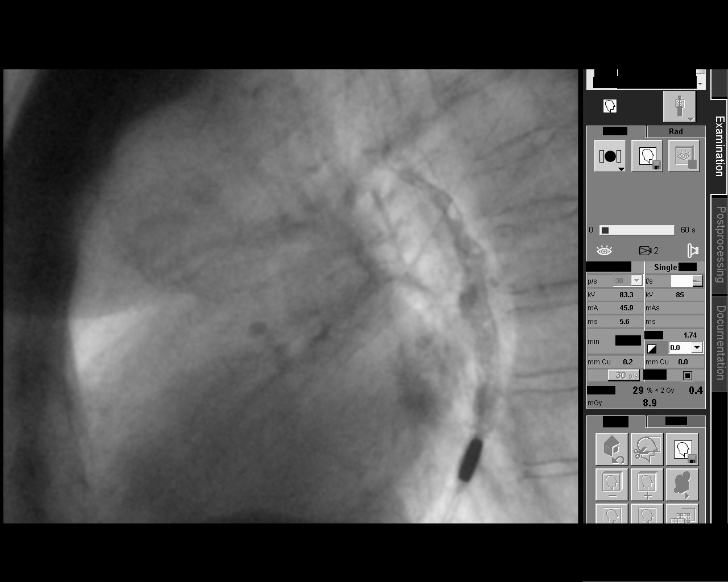

[Series 11: puree · 1 of 124 frames shown (2 of 2)]
[frame 106/124]
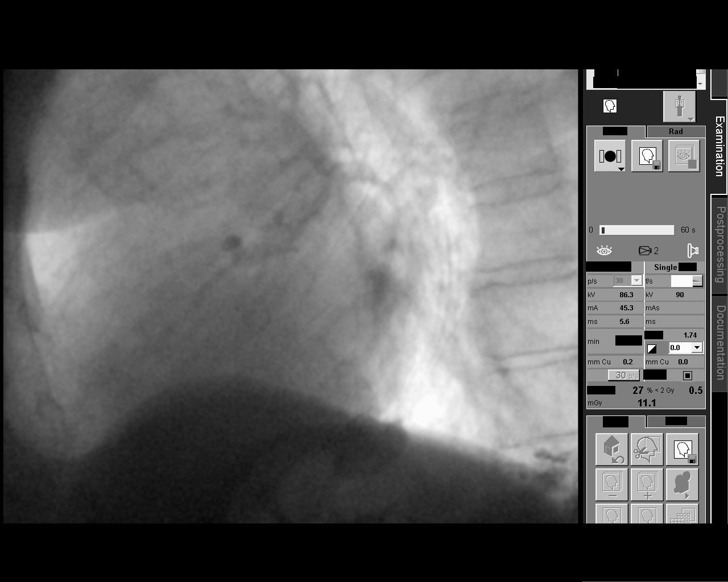

[13 of 24 positions shown; findings below may reference images not displayed]

FINDINGS: Thin barium, applesauce and cracker consistency, normal oro
pharyngeal motion is seen. No laryngeal penetration or aspiration.
Patient swallowed a 12.5 mm diameter barium tablet with thin barium,
demonstrating minimal flash laryngeal penetration. The tablet passed
to the stomach without obstruction. No residuals.
IMPRESSION: Minimal flash laryngeal penetration on a single swallow of thin
barium, while swallowing a 12.5 mm diameter barium tablet.

Otherwise normal exam.

Please refer to the Speech Pathologists report for complete details
and recommendations.

## 2023-02-13 DIAGNOSIS — C771 Secondary and unspecified malignant neoplasm of intrathoracic lymph nodes: Secondary | ICD-10-CM | POA: Diagnosis not present

## 2023-02-13 DIAGNOSIS — C3411 Malignant neoplasm of upper lobe, right bronchus or lung: Secondary | ICD-10-CM | POA: Diagnosis not present

## 2023-02-16 ENCOUNTER — Ambulatory Visit (INDEPENDENT_AMBULATORY_CARE_PROVIDER_SITE_OTHER): Payer: HMO | Admitting: Orthopaedic Surgery

## 2023-02-16 DIAGNOSIS — S93431D Sprain of tibiofibular ligament of right ankle, subsequent encounter: Secondary | ICD-10-CM | POA: Diagnosis not present

## 2023-02-16 NOTE — Progress Notes (Signed)
The patient is well-known to [place].  She is 38 and has been dealing with stage III lung cancer.  She just I believe may have finished radiation or had radiation therapy treatment yesterday.  On December 16 she did fall injuring her ankles.  The right was worse than left.  She has never get bruising and swelling but x-rays were negative for any type of fracture.  She does use a rolling walker and still deals with severe chronic back pain.  It is worse when she stands in terms of her low back pain.  Examination of her right ankle shows minimal swelling and no bruising.  Her range of motion is good and the ankle feels ligamentously stable.  I did review her previous ankle x-rays and showed no fracture or malalignment of either ankle.  From a back standpoint I recommended she at least try an over-the-counter lumbar support brace that she can get at [facility].  This could help with back pain while she stands and give her some support.  From the standpoint of her ankles, follow-up is as needed.

## 2023-02-18 DIAGNOSIS — C3491 Malignant neoplasm of unspecified part of right bronchus or lung: Secondary | ICD-10-CM | POA: Diagnosis not present

## 2023-02-18 DIAGNOSIS — C3490 Malignant neoplasm of unspecified part of unspecified bronchus or lung: Secondary | ICD-10-CM | POA: Diagnosis not present

## 2023-02-19 DIAGNOSIS — H353211 Exudative age-related macular degeneration, right eye, with active choroidal neovascularization: Secondary | ICD-10-CM | POA: Diagnosis not present

## 2023-02-19 DIAGNOSIS — J984 Other disorders of lung: Secondary | ICD-10-CM | POA: Diagnosis not present

## 2023-02-19 DIAGNOSIS — H401132 Primary open-angle glaucoma, bilateral, moderate stage: Secondary | ICD-10-CM | POA: Diagnosis not present

## 2023-02-19 DIAGNOSIS — H353132 Nonexudative age-related macular degeneration, bilateral, intermediate dry stage: Secondary | ICD-10-CM | POA: Diagnosis not present

## 2023-02-23 ENCOUNTER — Encounter: Payer: Self-pay | Admitting: Internal Medicine

## 2023-02-23 ENCOUNTER — Ambulatory Visit (HOSPITAL_COMMUNITY)
Admission: RE | Admit: 2023-02-23 | Discharge: 2023-02-23 | Disposition: A | Discharge status: Home / Self Care | Payer: HMO | Source: Ambulatory Visit | Attending: Internal Medicine | Admitting: Internal Medicine

## 2023-02-23 ENCOUNTER — Ambulatory Visit: Payer: HMO | Admitting: Internal Medicine

## 2023-02-23 VITALS — BP 101/70 | HR 103 | Wt 168.0 lb

## 2023-02-23 DIAGNOSIS — J9811 Atelectasis: Secondary | ICD-10-CM | POA: Diagnosis not present

## 2023-02-23 DIAGNOSIS — R058 Other specified cough: Secondary | ICD-10-CM | POA: Diagnosis not present

## 2023-02-23 DIAGNOSIS — J4520 Mild intermittent asthma, uncomplicated: Secondary | ICD-10-CM | POA: Diagnosis not present

## 2023-02-23 DIAGNOSIS — R0609 Other forms of dyspnea: Secondary | ICD-10-CM | POA: Insufficient documentation

## 2023-02-23 DIAGNOSIS — R059 Cough, unspecified: Secondary | ICD-10-CM | POA: Diagnosis not present

## 2023-02-23 DIAGNOSIS — I700 Atherosclerosis of aorta: Secondary | ICD-10-CM | POA: Diagnosis not present

## 2023-02-23 MED ORDER — PREDNISONE 10 MG PO TABS: ORAL_TABLET | ORAL | 0 refills | Status: DC

## 2023-02-23 MED ORDER — BUDESONIDE-FORMOTEROL FUMARATE 80-4.5 MCG/ACT IN AERO: INHALATION_SPRAY | RESPIRATORY_TRACT | 12 refills | Status: DC

## 2023-02-23 MED ORDER — FAMOTIDINE 20 MG PO TABS: ORAL_TABLET | ORAL | 11 refills | Status: DC

## 2023-02-23 NOTE — Assessment & Plan Note (Signed)
Rx cyclical cough regimen 02/23/2023  with tramadol, max gerd rx, off DPI's and on symbicort 80 2bid   Of the three most common causes of  Sub-acute / recurrent or chronic cough, only one (GERD)  can actually contribute to/ trigger  the other two (asthma and post nasal drip syndrome)  and perpetuate the cylce of cough.  While not intuitively obvious, many patients with chronic low grade reflux do not cough until there is a primary insult that disturbs the protective epithelial barrier and exposes sensitive nerve endings.   This is typically viral but can due to PNDS and  either may apply here.     >>>The point is that once this occurs, it is difficult to eliminate the cycle  using anything but a maximally effective acid suppression regimen at least in the short run, accompanied by an appropriate diet to address non acid GERD and control / eliminate the cough itself for at least 3 days with tramadol  short term and gabapentin to max of 300 mg qid longterm  >>> also so added 6 day taper off  Prednisone starting at 40 mg per day in case of component of Th-2 driven (or RT pneumonitis not obvious on cxr)  upper or lower airways inflammation (if cough responds short term only to relapse before return while will on full rx for uacs (as above), then  that would point to allergic rhinitis/ asthma or eos bronchitis as alternative dx)    F/u in 6 weeks, sooner if needed   Discussed in detail all the  indications, usual  risks and alternatives  relative to the benefits with patient who agrees to proceed with Rx as outlined.             Each maintenance medication was reviewed in detail including emphasizing most importantly the difference between maintenance and prns and under what circumstances the prns are to be triggered using an action plan format where appropriate.  Total time for H and P, chart review, counseling, reviewing hfa  device(s) and generating customized AVS unique to this office visit / same  day charting  >40 min for   refractory respiratory  symptoms of uncertain etiology

## 2023-02-23 NOTE — Assessment & Plan Note (Signed)
Quit smoking 2022 due to cough/ wheeze worse since then  - 06/17/2022  After extensive coaching inhaler device,  effectiveness =  75% with hfa so try change dpi to symb 80 2bid as main problem is cough / choking / globus sensation and rx max gerd x 6 week trial > 07/29/2022 lung clear 07/29/2022 and no more noct choking but still globus/ freq throat clearing so rec depomedrol 120/ rechallenge with symb 80 2bid  - PFTs  10/07/22  wnl p Trelegy prior.  >>>> 02/23/2023  After extensive coaching inhaler device,  effectiveness =    75% with hfa > changed back to symbicort 80 2bid from trelegy for refractory cough p RT

## 2023-02-23 NOTE — Progress Notes (Signed)
Zoe [name], female    DOB: 12/04/1935    MRN: [id number]   Brief patient profile:  81  yowf  quit smoking Aug 2022 due to cough/wheezing but very little need for albuterol  referred to pulmonary clinic in [facility]  06/17/2022 by Zoe [name]  for copd eval.   History of Present Illness  06/17/2022  Pulmonary/ 1st office eval/ Zoe [name] / [name] on Advair or Trelegy and can't tell much difference between the two Chief Complaint  Patient presents with   Pulmonary Consult    Referred by Zoe [name] for eval of COPD. Pt c/o cough for years. She has been coughing up large amounts of yellow to green sputum over the past 2-3 months.   Dyspnea:  pushes cart at [employer] /uses [facility] due to back / not breathing  Cough: yellowish / green off and on for months  Sleep: bed is flat  2 choking spells "though I was going to die"  in last few months prior to OV  resolved s heimlich "felt like spit was stuck" - takes otc ppi q d pc  SABA use: hfa every few days  02: none  Rec Stop advair/ trelegy (powder inhalers)  Plan A = Automatic = Always=    symbicort 80 Take 2 puffs first thing in am and then another 2 puffs about 12 hours later.  Work on inhaler technique: Plan B = Backup (to supplement plan A, not to replace it) Only use your albuterol inhaler as a rescue medication Pantoprazole (protonix) 40 mg   Take  30-60 min before first meal of the day and Pepcid (famotidine)  20 mg after supper until return to office  GERD diet reviewed, bed blocks rec  Please schedule a follow up office visit in 6 weeks, call sooner if needed with all respiratory medications /inhalers/ solutions in hand    Late add RUL nodule rx omnicef x 10 d then ct at 2 weeks   07/29/2022  f/u ov/Zoe [name] office/Zoe [name] re: COPD gold ? / AB  maint on symbicort     f/u spn / did not bring meds   ? On 160 symb vs 80 Chief Complaint  Patient presents with   Follow-up  Dyspnea:  about the same  Cough: non productive  cough - says   trelegy helped cough up mucus better  Sleeping: bed is flat, no more choking spells  SABA use: rarely  02: none  Rec Plan A = Automatic = Always=  Try symbicort 80 Take 2 puffs first thing in am and then another 2 puffs about 12 hours later and take gabapentin for breakfast, supper and bedtime  If not better after a month then ok to change to Trelegy 100 one click each am  Plan B for breathing = Backup (to supplement plan A, not to replace it) Only use your albuterol inhaler as a rescue medication Plan C = Crisis (instead of Plan B but only if Plan B stops working) - only use your albuterol nebulizer if you first try Plan B  For cough/ congestion >   mucinex dm  up to maximum of  1200 mg every 12 hours as needed Continue Pantoprazole (protonix) 40 mg   Take  30-60 min before first meal of the day and Pepcid (famotidine)  20 mg after supper until return to office - this is the best way to tell whether stomach acid is contributing to your problem.  Depomedrol 120 mg IM  Zoe [name] 10th last RT    02/23/2023  ACUTE  ov/[place] office/Zoe [name] re: cough  maint on Trelegy   Chief Complaint  Patient presents with   Acute Visit  Dyspnea:  breathing worse x  weeks Cough: dry hack since April 2024, better since stopped trelegy last 2 d / cough to point of gagging Sleeping: bed is flat and uses wedge otherwise cough/ sob keep her up   SABA use: none  02: none       No obvious day to day or daytime variability or assoc excess/ purulent sputum or mucus plugs or hemoptysis or cp or chest tightness, subjective wheeze or overt sinus or hb symptoms.    Also denies any obvious fluctuation of symptoms with weather or environmental changes or other aggravating or alleviating factors except as outlined above   No unusual exposure hx or h/o childhood pna/ asthma or knowledge of premature birth.  Current Allergies, Complete Past Medical History, Past Surgical History, Family History, and Social History were  reviewed in Zoe [name] record.  ROS  The following are not active complaints unless bolded Hoarseness, sore throat, dysphagia, dental problems, itching, sneezing,  nasal congestion or discharge of excess mucus or purulent secretions, ear ache,   fever, chills, sweats, unintended wt loss or wt gain, classically pleuritic or exertional cp,  orthopnea pnd or arm/hand swelling  or leg swelling, presyncope, palpitations, abdominal pain, anorexia, nausea, vomiting, diarrhea  or change in bowel habits or change in bladder habits, change in stools or change in urine, dysuria, hematuria,  rash, arthralgias, visual complaints, headache, numbness, weakness or ataxia or problems with walking or coordination,  change in mood or  memory.        Current Meds  Medication Sig   albuterol (PROVENTIL HFA;VENTOLIN HFA) 108 (90 BASE) MCG/ACT inhaler Inhale 2 puffs into the lungs every 6 (six) hours as needed for wheezing or shortness of breath.   allopurinol (ZYLOPRIM) 300 MG tablet Take 1 tablet (300 mg total) by mouth daily.   ALPRAZolam (XANAX) 0.5 MG tablet Take 0.5 mg by mouth 2 (two) times daily as needed for anxiety.   apixaban (ELIQUIS) 5 MG TABS tablet Take 1 tablet (5 mg total) by mouth 2 (two) times daily.   APPLE CIDER VINEGAR PO Take 625 mg by mouth daily.   Biotin 5000 MCG TABS Take 5,000-10,000 mcg by mouth daily.   calcium-vitamin D 250-100 MG-UNIT tablet Take 1 tablet by mouth daily.   Cholecalciferol (VITAMIN D-3) 125 MCG (5000 UT) TABS Take 5,000 Units by mouth daily.   Cranberry-Vitamin C (CRANBERRY CONCENTRATE/VITAMINC PO) Take 250 mg by mouth daily.   cyanocobalamin 2000 MCG tablet Take 5,000 mcg by mouth daily.   diclofenac Sodium (VOLTAREN) 1 % GEL Apply 2-4 g topically 4 (four) times daily. APPLY 2-4 GRAMS TOPICALLY FOUR TIMES DAILY Strength: 1 %   docusate sodium (COLACE) 100 MG capsule Take 100 mg by mouth daily.   dorzolamide (TRUSOPT) 2 % ophthalmic solution Place  1 drop into the right eye daily.   doxepin (SINEQUAN) 100 MG capsule Take 1 capsule (100 mg total) by mouth at bedtime.   Fluticasone-Umeclidin-Vilant (TRELEGY ELLIPTA) 100-62.5-25 MCG/ACT AEPB Inhale 1 puff into the lungs daily.   furosemide (LASIX) 40 MG tablet Take 1 tablet (40 mg total) by mouth daily. Take for 7 days then stop   gabapentin (NEURONTIN) 300 MG capsule Take 300 mg by mouth 4 (four) times daily as needed (pain).   ibuprofen (ADVIL) 800  MG tablet Take 800 mg by mouth every 6 (six) hours as needed.   lactulose (CHRONULAC) 10 GM/15ML solution 15 ml daily   levocetirizine (XYZAL) 5 MG tablet Take 5 mg by mouth every evening.   lubiprostone (AMITIZA) 24 MCG capsule Take 1 capsule (24 mcg total) by mouth 2 (two) times daily with a meal.   magnesium oxide (MAG-OX) 400 (240 Mg) MG tablet Take 400 mg by mouth every Monday, Wednesday, and Friday.   meloxicam (MOBIC) 15 MG tablet Take 15 mg by mouth daily.   metoprolol tartrate (LOPRESSOR) 25 MG tablet Take 0.5 tablets (12.5 mg total) by mouth 2 (two) times daily. (Patient taking differently: Take 25 mg by mouth at bedtime.)   omeprazole (PRILOSEC) 40 MG capsule Take 1 capsule (40 mg total) by mouth daily.   Potassium 99 MG TABS Take 99 mg by mouth every Monday, Wednesday, and Friday.   pravastatin (PRAVACHOL) 20 MG tablet Take 20 mg by mouth as directed. 3 x week   prochlorperazine (COMPAZINE) 10 MG tablet Take 1 tablet (10 mg total) by mouth every 6 (six) hours as needed for nausea or vomiting.   senna (SENOKOT) 8.6 MG tablet Take 5 tablets by mouth 2 (two) times daily.   traMADol (ULTRAM) 50 MG tablet Take 1 tablet (50 mg total) by mouth every 6 (six) hours as needed for moderate pain (pain score 4-6) (mild pain).   Travoprost, BAK Free, (TRAVATAN) 0.004 % SOLN ophthalmic solution Place 1 drop into both eyes at bedtime.   traZODone (DESYREL) 50 MG tablet Take 50-100 mg by mouth at bedtime.                 Past Medical History:   Diagnosis Date   Abdominal discomfort 11/02/2012   suspected ?UTI- PCP MD placed on Cipro   Anxiety    Arthritis    Asthma    Cancer (HCC)    skin CA   Chronic constipation    Depression    Dysrhythmia    tachycardia   Environmental allergies    Fibromyalgia    GERD (gastroesophageal reflux disease)    Hemorrhoids    Hypercholesteremia    Hypertension    Sleep apnea       Objective:    Wts   02/23/2023        168   07/29/22 172 lb (78 kg)  06/17/22 171 lb (77.6 kg)  06/11/22 170 lb (77.1 kg)    Vital signs reviewed  02/23/2023  - Note at rest 02 sats  92% on RA   General appearance:    chronically ill weak appearing w/c bound wf with harsh upper airway pattern cough   HEENT : Oropharynx  clear   Nasal turbinates nl    NECK :  without  apparent JVD/ palpable Nodes/TM    LUNGS: no acc muscle use,  Min barrel  contour chest wall with bilateral  slightly decreased bs s audible wheeze and  without cough on insp or exp maneuvers and min  Hyperresonant  to  percussion bilaterally    CV:  RRR  no s3 or murmur or increase in P2, and no edema   ABD:  soft and nontender with pos end  insp Hoover's  in the supine position.  No bruits or organomegaly appreciated   MS:  Nl gait/ ext warm without deformities Or obvious joint restrictions  calf tenderness, cyanosis or clubbing     SKIN: warm and dry without lesions  NEURO:  alert, approp, nl sensorium with  no motor or cerebellar deficits apparent.              CXR PA and Lateral:   02/23/2023 :    I personally reviewed images and impression is as follows:     Post op changes on R , no def pneumonitis     Assessment

## 2023-02-23 NOTE — Progress Notes (Signed)
[facility] - Clinic Note  02/24/2023   CHIEF COMPLAINT Patient presents for Retina Evaluation  HISTORY OF PRESENT ILLNESS: Zoe [name] is a 88 y.o. female who presents to the clinic today for:  HPI     Retina Evaluation   In right eye.  This started 5 days ago.  Duration of 5 days.  Associated Symptoms Flashes and Floaters.  Context:  distance vision, mid-range vision and near vision.  I, the attending physician,  performed the HPI with the patient and updated documentation appropriately.        Comments   Retina eval per Dr Zenaida Niece ERM and ret heme OD pt is reporting some flashes in her OD she has been seeing things that look black and white pt states vision has not been as sharp pt has glaucoma latanoprost at bedtime ou dorz  bid ou brim bid ou pt just finished radiation January 10 th 30 treatments for non small cell lung cancer she maybe having to have more unsure until after scan       Last edited by Rennis Chris, MD on 02/24/2023  4:42 PM.    Pt is here on the referral of Dr. Zenaida Niece for concern of exu ARMD OD, pt states she had cataract sx OU with Dr. Jettie Pagan several years ago, but she needed to have the right lens fixed, but was never able to, she states she has had nothing but problems since then, pt states she has been seeing Dr. Zenaida Niece for several years, prior to that she saw Dr. Marchelle Gearing, she states Dr. Dione Booze did not tell her she had macular degeneration, but Dr. Zenaida Niece did, pt states she noticed a change in her vision about 6 months ago in the right eye, pt has lung cancer and has had sx and radiation for it   Referring physician: Diona Foley, MD [address] [place],  [place] [address]  HISTORICAL INFORMATION:  Selected notes from the MEDICAL RECORD NUMBER Referred by Dr. Zenaida Niece for concern of exu ARMD OD LEE:  Ocular Hx- PMH-   CURRENT MEDICATIONS: Current Outpatient Medications (Ophthalmic Drugs)  Medication Sig   dorzolamide (TRUSOPT) 2 %  ophthalmic solution Place 1 drop into the right eye daily.   Travoprost, BAK Free, (TRAVATAN) 0.004 % SOLN ophthalmic solution Place 1 drop into both eyes at bedtime.   No current facility-administered medications for this visit. (Ophthalmic Drugs)   Current Outpatient Medications (Other)  Medication Sig   albuterol (PROVENTIL HFA;VENTOLIN HFA) 108 (90 BASE) MCG/ACT inhaler Inhale 2 puffs into the lungs every 6 (six) hours as needed for wheezing or shortness of breath.   allopurinol (ZYLOPRIM) 300 MG tablet Take 1 tablet (300 mg total) by mouth daily.   ALPRAZolam (XANAX) 0.5 MG tablet Take 0.5 mg by mouth 2 (two) times daily as needed for anxiety.   apixaban (ELIQUIS) 5 MG TABS tablet Take 1 tablet (5 mg total) by mouth 2 (two) times daily.   APPLE CIDER VINEGAR PO Take 625 mg by mouth daily.   Biotin 5000 MCG TABS Take 5,000-10,000 mcg by mouth daily.   budesonide-formoterol (SYMBICORT) 80-4.5 MCG/ACT inhaler Take 2 puffs first thing in am and then another 2 puffs about 12 hours later.   calcium-vitamin D 250-100 MG-UNIT tablet Take 1 tablet by mouth daily.   Cholecalciferol (VITAMIN D-3) 125 MCG (5000 UT) TABS Take 5,000 Units by mouth daily.   Cranberry-Vitamin C (CRANBERRY CONCENTRATE/VITAMINC PO) Take 250 mg by mouth daily.  cyanocobalamin 2000 MCG tablet Take 5,000 mcg by mouth daily.   diclofenac Sodium (VOLTAREN) 1 % GEL Apply 2-4 g topically 4 (four) times daily. APPLY 2-4 GRAMS TOPICALLY FOUR TIMES DAILY Strength: 1 %   docusate sodium (COLACE) 100 MG capsule Take 100 mg by mouth daily.   doxepin (SINEQUAN) 100 MG capsule Take 1 capsule (100 mg total) by mouth at bedtime.   famotidine (PEPCID) 20 MG tablet One after supper   furosemide (LASIX) 40 MG tablet Take 1 tablet (40 mg total) by mouth daily. Take for 7 days then stop   gabapentin (NEURONTIN) 300 MG capsule Take 300 mg by mouth 4 (four) times daily as needed (pain).   ibuprofen (ADVIL) 800 MG tablet Take 800 mg by mouth  every 6 (six) hours as needed.   lactulose (CHRONULAC) 10 GM/15ML solution 15 ml daily   levocetirizine (XYZAL) 5 MG tablet Take 5 mg by mouth every evening.   lubiprostone (AMITIZA) 24 MCG capsule Take 1 capsule (24 mcg total) by mouth 2 (two) times daily with a meal.   magnesium oxide (MAG-OX) 400 (240 Mg) MG tablet Take 400 mg by mouth every Monday, Wednesday, and Friday.   meloxicam (MOBIC) 15 MG tablet Take 15 mg by mouth daily.   metoprolol tartrate (LOPRESSOR) 25 MG tablet Take 0.5 tablets (12.5 mg total) by mouth 2 (two) times daily. (Patient taking differently: Take 25 mg by mouth at bedtime.)   omeprazole (PRILOSEC) 40 MG capsule Take 1 capsule (40 mg total) by mouth daily.   Potassium 99 MG TABS Take 99 mg by mouth every Monday, Wednesday, and Friday.   pravastatin (PRAVACHOL) 20 MG tablet Take 20 mg by mouth as directed. 3 x week   predniSONE (DELTASONE) 10 MG tablet Take  4 each am x 2 days,   2 each am x 2 days,  1 each am x 2 days and stop   prochlorperazine (COMPAZINE) 10 MG tablet Take 1 tablet (10 mg total) by mouth every 6 (six) hours as needed for nausea or vomiting.   senna (SENOKOT) 8.6 MG tablet Take 5 tablets by mouth 2 (two) times daily.   traMADol (ULTRAM) 50 MG tablet Take 1 tablet (50 mg total) by mouth every 6 (six) hours as needed for moderate pain (pain score 4-6) (mild pain).   traZODone (DESYREL) 50 MG tablet Take 50-100 mg by mouth at bedtime.   No current facility-administered medications for this visit. (Other)   Facility-Administered Medications Ordered in Other Visits (Other)  Medication Route   regadenoson (LEXISCAN) injection SOLN 0.4 mg Intravenous   technetium tetrofosmin (TC-MYOVIEW) injection 31.4 millicurie Intravenous   REVIEW OF SYSTEMS: ROS   Positive for: Eyes, Respiratory, Allergic/Imm Last edited by Etheleen Mayhew, COT on 02/24/2023  1:30 PM.     ALLERGIES Allergies  Allergen Reactions   Codeine Other (See Comments)    Pains  in abdominal area   Other     NOVACAINE     ITCHING Anti-depressants   Oxycodone     Abdominal pain, chest pain   Paxil [Paroxetine Hcl] Itching   Procaine Itching   PAST MEDICAL HISTORY Past Medical History:  Diagnosis Date   A-fib [facility])    pt went into A-fib during lung biopsies   Abdominal discomfort 11/02/2012   suspected ?UTI- PCP MD placed on Cipro   Anxiety    Arthritis    Asthma    Cancer (HCC)    skin CA   Choking due to phlegm  Chronic constipation    Depression    Dysrhythmia    tachycardia   Environmental allergies    Fibromyalgia    GERD (gastroesophageal reflux disease)    Hemorrhoids    Hiatal hernia    Hypercholesteremia    Hypertension    Sleep apnea    Past Surgical History:  Procedure Laterality Date   ANAL RECTAL MANOMETRY N/A 11/28/2013   Procedure: ANO RECTAL MANOMETRY;  Surgeon: Romie Levee, MD;  Location: [facility];  Service: Endoscopy;  Laterality: N/A;   APPENDECTOMY     BACK SURGERY     X2    BIOPSY  03/13/2021   Procedure: BIOPSY;  Surgeon: Malissa Hippo, MD;  [facility]: [facility];  Service: Endoscopy;;   BRONCHIAL BIOPSY  09/02/2022   Procedure: BRONCHIAL BIOPSIES;  Surgeon: Josephine Igo, DO;  Location: [facility];  Service: Pulmonary;;   BRONCHIAL BRUSHINGS  09/02/2022   Procedure: BRONCHIAL BRUSHINGS;  Surgeon: Josephine Igo, DO;  [facility]: [facility];  Service: Pulmonary;;   BRONCHIAL NEEDLE ASPIRATION BIOPSY  09/02/2022   Procedure: BRONCHIAL NEEDLE ASPIRATION BIOPSIES;  Surgeon: Josephine Igo, DO;  [facility]: [facility];  Service: Pulmonary;;   CHOLECYSTECTOMY     COLONOSCOPY  03/29/03   COLONOSCOPY N/A 12/09/2013   Procedure: COLONOSCOPY;  Surgeon: Malissa Hippo, MD;  [facility];  Service: Endoscopy;  Laterality: N/A;  730   COLONOSCOPY N/A 02/04/2017   Procedure: COLONOSCOPY;  Surgeon: Malissa Hippo, MD;  [facility];  Service: Endoscopy;  Laterality: N/A;  930   ENDOBRONCHIAL  ULTRASOUND Bilateral 09/02/2022   Procedure: ENDOBRONCHIAL ULTRASOUND;  Surgeon: Josephine Igo, DO;  [facility]: [facility];  Service: Pulmonary;  Laterality: Bilateral;   ESOPHAGEAL DILATION N/A 03/13/2021   Procedure: ESOPHAGEAL DILATION;  Surgeon: Malissa Hippo, MD;  [facility];  Service: Endoscopy;  Laterality: N/A;   ESOPHAGOGASTRODUODENOSCOPY (EGD) WITH PROPOFOL N/A 03/13/2021   Procedure: ESOPHAGOGASTRODUODENOSCOPY (EGD) WITH PROPOFOL;  Surgeon: Malissa Hippo, MD;  [facility];  Service: Endoscopy;  Laterality: N/A;  8:05   EYE SURGERY     bilateral cataract removal   INTERCOSTAL NERVE BLOCK Right 11/17/2022   Procedure: INTERCOSTAL NERVE BLOCK;  Surgeon: Corliss Skains, MD;  Location: [facility];  Service: Thoracic;  Laterality: Right;   IR KYPHO LUMBAR INC FX REDUCE BONE BX UNI/BIL CANNULATION INC/IMAGING  11/24/2019   IR KYPHO THORACIC WITH BONE BIOPSY  08/23/2019   IR KYPHO THORACIC WITH BONE BIOPSY  11/24/2019   IR RADIOLOGIST EVAL & MGMT  08/03/2019   IR RADIOLOGIST EVAL & MGMT  09/29/2019   IR RADIOLOGIST EVAL & MGMT  10/18/2019   IR RADIOLOGIST EVAL & MGMT  04/11/2020   NODE DISSECTION Right 11/17/2022   Procedure: NODE DISSECTION;  Surgeon: Corliss Skains, MD;  Location: [facility];  Service: Thoracic;  Laterality: Right;   SKIN CANCER EXCISION     RIGHT NECK     TONSILLECTOMY     T+A   TOTAL HIP ARTHROPLASTY Right 09/14/2012   Procedure: RIGHT TOTAL HIP ARTHROPLASTY ANTERIOR APPROACH and RIGHT KNEE STEROID INJECTION;  Surgeon: Kathryne Hitch, MD;  Location: [facility];  Service: Orthopedics;  Laterality: Right;   TOTAL HIP ARTHROPLASTY Left 11/05/2012   Procedure: LEFT TOTAL HIP ARTHROPLASTY ANTERIOR APPROACH;  Surgeon: Kathryne Hitch, MD;  Location: [facility];  Service: Orthopedics;  Laterality: Left;   UMBILICAL HERNIA REPAIR  04/26/03   JENKINS   UPPER  GASTROINTESTINAL ENDOSCOPY  08/03/2009   NUR   UPPER GASTROINTESTINAL ENDOSCOPY   03/29/2003   EGD ED TCS   FAMILY HISTORY Family History  Problem Relation Age of Onset   Healthy Son    Healthy Son    Healthy Daughter    Colon cancer Neg Hx    SOCIAL HISTORY Social History   Tobacco Use   Smoking status: Former    Current packs/day: 0.00    Average packs/day: 0.5 packs/day for 50.0 years (25.0 ttl pk-yrs)    Types: Cigarettes    Start date: 09/07/1970    Quit date: 09/06/2020    Years since quitting: 2.4   Smokeless tobacco: Never  Vaping Use   Vaping status: Never Used  Substance Use Topics   Alcohol use: Yes    Comment: 1-2 drinks per week   Drug use: No       OPHTHALMIC EXAM:  Base Eye Exam     Visual Acuity (Snellen - Linear)       Right Left   Dist [place] 20/80 -2 20/20 -2   Dist ph [facility] 20/50 -2          Tonometry (Tonopen, 1:37 PM)       Right Left   Pressure 14 16         Pupils       Pupils   Right PERRL   Left PERRL         Neuro/Psych     Oriented x3: Yes   Mood/Affect: Normal         Dilation     Both eyes: 2.5% Phenylephrine @ 1:37 PM           Slit Lamp and Fundus Exam     Slit Lamp Exam       Right Left   Lids/Lashes Dermatochalasis - upper lid Dermatochalasis - upper lid   Conjunctiva/Sclera White and quiet White and quiet   Cornea well healed cataract wound, trace PEE, mild tear film debris mild arcus, mild tear film debris, well healed cataract wound   Anterior Chamber deep and clear deep and clear   Iris Round and dilated Round and dilated   Lens 3 piece posterior chamber intraocular lens with mild temporal displacement 3 piece posterior chamber intraocular lens in good position   Anterior Vitreous mild syneresis mild syneresis         Fundus Exam       Right Left   Disc mild Pallor, Sharp rim, temporal PPA mild Pallor, Sharp rim, temporal PPA   C/D Ratio 0.3 0.3   Macula Blunted foveal reflex, central CNV with +heme and edema, Drusen, RPE mottling Flat, Blunted foveal reflex, Drusen,  RPE mottling and clumping, No heme or edema   Vessels attenuated, mild tortuosity attenuated, mild tortuosity   Periphery Attached, pigmented paving stone degeneration inferiorly, No heme Attached, reticular degeneration, No heme           IMAGING AND PROCEDURES  Imaging and Procedures for 02/24/2023  OCT, Retina - OU - Both Eyes       Right Eye Quality was good. Central Foveal Thickness: 694. Progression has no prior data. Findings include no IRF, abnormal foveal contour, retinal drusen , subretinal hyper-reflective material, choroidal neovascular membrane, pigment epithelial detachment, subretinal fluid (Central CNV with surrounding SRF / SRHM).   Left Eye Quality was good. Central Foveal Thickness: 232. Progression has no prior data. Findings include normal foveal contour, no IRF, no SRF, retinal drusen (Mild  patchy ORA).   Notes *Images captured and stored on drive  Diagnosis / Impression:  OD: Central CNV with surrounding SRF / SRHM -- exudative ARMD OS: nonexudative ARMD w/ patchy ORA  Clinical management:  See below  Abbreviations: NFP - Normal foveal profile. CME - cystoid macular edema. PED - pigment epithelial detachment. IRF - intraretinal fluid. SRF - subretinal fluid. EZ - ellipsoid zone. ERM - epiretinal membrane. ORA - outer retinal atrophy. ORT - outer retinal tubulation. SRHM - subretinal hyper-reflective material. IRHM - intraretinal hyper-reflective material      Intravitreal Injection, Pharmacologic Agent - OD - Right Eye       Time Out 02/24/2023. 2:06 PM. Confirmed correct patient, procedure, site, and patient consented.   Anesthesia Topical anesthesia was used. Anesthetic medications included Lidocaine 2%, Proparacaine 0.5%.   Procedure Preparation included 5% betadine to ocular surface, eyelid speculum. A supplied needle was used.   Injection: 1.25 mg Bevacizumab 1.25mg /0.110ml   Route: Intravitreal, Site: Right Eye   NDC: [id number], Lot:  [id number], Expiration date: 03/27/2023   Post-op Post injection exam found visual acuity of at least counting fingers. The patient tolerated the procedure well. There were no complications. The patient received written and verbal post procedure care education.           ASSESSMENT/PLAN:   ICD-10-CM   1. Exudative age-related macular degeneration of right eye with active choroidal neovascularization (HCC)  H35.3211 OCT, Retina - OU - Both Eyes    Intravitreal Injection, Pharmacologic Agent - OD - Right Eye    Bevacizumab (AVASTIN) SOLN 1.25 mg    2. 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 Essential hypertension  I10     4. Hypertensive retinopathy of both eyes  H35.033     5. Pseudophakia, both eyes  Z96.1      1. 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, OD  - The incidence pathology and anatomy of wet AMD discussed   - discussed treatment options including observation vs intravitreal anti-VEGF agents such as Avastin, Lucentis, Eylea.    - Risks of endophthalmitis and vascular occlusive events and atrophic changes discussed with patient  - OCT shows central CNV with surrounding SRF / SRHM  - exam shows central CNV w/ +heme  - BCVA OD 20/50  - recommend IVA OD #1 today, 01.28.25 - pt wishes to be treated with IVA - RBA of procedure discussed, questions answered - IVA informed consent obtained and signed, 01.28.25 - see procedure note  - f/u in 4 wks -- DFE/OCT, possible injection   2. Age related macular degeneration, non-exudative, OS  - The incidence, anatomy, and pathology of dry AMD, risk of progression, and the AREDS and AREDS 2 study including smoking risks discussed with patient.  - Recommend amsler grid monitoring  - monitor  3,4. Hypertensive retinopathy OU - discussed importance of tight BP control - monitor  5. Pseudophakia OU  - s/p CE/IOL (Dr. Jettie Pagan)  - IOL in good position, doing well  -  monitor  Ophthalmic Meds Ordered this visit:  Meds ordered this encounter  Medications   Bevacizumab (AVASTIN) SOLN 1.25 mg     Return in about 4 weeks (around 03/24/2023) for exu ARMD OD, DFE, OCT, Possible Injxn.  There are no Patient Instructions on file for this visit.  Explained the diagnoses, plan, and follow up with the patient and they expressed understanding.  Patient expressed understanding of the importance of proper follow up  care.   This document serves as a record of services personally performed by Karie Chimera, MD, PhD. It was created on their behalf by Charlette Caffey, COT an ophthalmic technician. The creation of this record is the provider's dictation and/or activities during the visit.    Electronically signed by:  Charlette Caffey, COT  02/24/23 11:16 PM  This document serves as a record of services personally performed by Karie Chimera, MD, PhD. It was created on their behalf by Glee Arvin. Manson Passey, OA an ophthalmic technician. The creation of this record is the provider's dictation and/or activities during the visit.    Electronically signed by: Glee Arvin. Manson Passey, OA 02/24/23 11:16 PM  Karie Chimera, M.D., Ph.D. Diseases & Surgery of the Retina and Vitreous [facility] 02/24/2023  I have reviewed the above documentation for accuracy and completeness, and I agree with the above. Karie Chimera, M.D., Ph.D. 02/24/23 11:20 PM   Abbreviations: M myopia (nearsighted); A astigmatism; H hyperopia (farsighted); P presbyopia; Mrx spectacle prescription;  CTL contact lenses; OD right eye; OS left eye; OU both eyes  XT exotropia; ET esotropia; PEK punctate epithelial keratitis; PEE punctate epithelial erosions; DES dry eye syndrome; MGD meibomian gland dysfunction; ATs artificial tears; PFAT's preservative free artificial tears; NSC nuclear sclerotic cataract; PSC posterior subcapsular cataract; ERM epi-retinal membrane; PVD posterior vitreous  detachment; RD retinal detachment; DM diabetes mellitus; DR diabetic retinopathy; NPDR non-proliferative diabetic retinopathy; PDR proliferative diabetic retinopathy; CSME clinically significant macular edema; DME diabetic macular edema; dbh dot blot hemorrhages; CWS cotton wool spot; POAG primary open angle glaucoma; C/D cup-to-disc ratio; HVF humphrey visual field; GVF goldmann visual field; OCT optical coherence tomography; IOP intraocular pressure; BRVO Branch retinal vein occlusion; CRVO central retinal vein occlusion; CRAO central retinal artery occlusion; BRAO branch retinal artery occlusion; RT retinal tear; SB scleral buckle; PPV pars plana vitrectomy; VH Vitreous hemorrhage; PRP panretinal laser photocoagulation; IVK intravitreal kenalog; VMT vitreomacular traction; MH Macular hole;  NVD neovascularization of the disc; NVE neovascularization elsewhere; AREDS age related eye disease study; ARMD age related macular degeneration; POAG primary open angle glaucoma; EBMD epithelial/anterior basement membrane dystrophy; ACIOL anterior chamber intraocular lens; IOL intraocular lens; PCIOL posterior chamber intraocular lens; Phaco/IOL phacoemulsification with intraocular lens placement; PRK photorefractive keratectomy; LASIK laser assisted in situ keratomileusis; HTN hypertension; DM diabetes mellitus; COPD chronic obstructive pulmonary disease

## 2023-02-23 NOTE — Patient Instructions (Addendum)
Try prilosec (omeprazole)   40mg   Take 30-60 min before first meal of the day and Pepcid ac (famotidine) 20 mg one  after supper until return   Increase the gabapentin to three times daily for a week then if still coughing then up to 4 x daily     Stop trelegy   Symbicort 80 Take 2 puffs first thing in am and then another 2 puffs about 12 hours later.   Take delsym two tsp every 12 hours and supplement if needed with tramadol 50 mg up to 1-2 every 4 hours to suppress the urge to cough. Swallowing water and/or using ice chips/non mint and menthol containing candies (such as lifesavers or sugarless jolly ranchers) are also effective.  You should rest your voice and avoid activities that you know make you cough.  Once you have eliminated the cough for 3 straight days try reducing the tramadol first,  then the delsym as tolerated.    Prednisone 10 mg take  4 each am x 2 days,   2 each am x 2 days,  1 each am x 2 days and stop   Only use your albuterol as a rescue medication to be used if you can't catch your breath by resting or doing a relaxed purse lip breathing pattern.  - The less you use it, the better it will work when you need it. - Ok to use up to 2 puffs  every 4 hours if you must but call for immediate appointment if use goes up over your usual need - Don't leave home without it !!  (think of it like the spare tire for your car)   Please schedule a follow up office visit in 6 weeks, call sooner if needed with all medications /inhalers/ solutions in hand so we can verify exactly what you are taking. This includes all medications from all doctors and over the counters

## 2023-02-24 ENCOUNTER — Encounter: Payer: Self-pay | Admitting: Internal Medicine

## 2023-02-24 ENCOUNTER — Encounter (INDEPENDENT_AMBULATORY_CARE_PROVIDER_SITE_OTHER): Payer: Self-pay | Admitting: Ophthalmology

## 2023-02-24 ENCOUNTER — Ambulatory Visit (INDEPENDENT_AMBULATORY_CARE_PROVIDER_SITE_OTHER): Payer: Self-pay | Admitting: Ophthalmology

## 2023-02-24 DIAGNOSIS — H35033 Hypertensive retinopathy, bilateral: Secondary | ICD-10-CM | POA: Diagnosis not present

## 2023-02-24 DIAGNOSIS — I10 Essential (primary) hypertension: Secondary | ICD-10-CM

## 2023-02-24 DIAGNOSIS — H353122 Nonexudative age-related macular degeneration, left eye, intermediate dry stage: Secondary | ICD-10-CM | POA: Diagnosis not present

## 2023-02-24 DIAGNOSIS — Z961 Presence of intraocular lens: Secondary | ICD-10-CM

## 2023-02-24 DIAGNOSIS — H353211 Exudative age-related macular degeneration, right eye, with active choroidal neovascularization: Secondary | ICD-10-CM

## 2023-02-24 DIAGNOSIS — H3581 Retinal edema: Secondary | ICD-10-CM

## 2023-02-24 LAB — SEDIMENTATION RATE: Sed Rate: 52 mm/h — ABNORMAL HIGH (ref 0–40)

## 2023-02-24 LAB — CBC WITH DIFFERENTIAL/PLATELET
Basophils Absolute: 0 10*3/uL (ref 0.0–0.2)
Basos: 0 %
EOS (ABSOLUTE): 0.3 10*3/uL (ref 0.0–0.4)
Eos: 5 %
Hematocrit: 34.4 % (ref 34.0–46.6)
Hemoglobin: 11.6 g/dL (ref 11.1–15.9)
Immature Grans (Abs): 0 10*3/uL (ref 0.0–0.1)
Immature Granulocytes: 0 %
Lymphocytes Absolute: 1.1 10*3/uL (ref 0.7–3.1)
Lymphs: 16 %
MCH: 30.6 pg (ref 26.6–33.0)
MCHC: 33.7 g/dL (ref 31.5–35.7)
MCV: 91 fL (ref 79–97)
Monocytes Absolute: 0.8 10*3/uL (ref 0.1–0.9)
Monocytes: 12 %
Neutrophils Absolute: 4.7 10*3/uL (ref 1.4–7.0)
Neutrophils: 67 %
Platelets: 224 10*3/uL (ref 150–450)
RBC: 3.79 x10E6/uL (ref 3.77–5.28)
RDW: 13.4 % (ref 11.7–15.4)
WBC: 7 10*3/uL (ref 3.4–10.8)

## 2023-02-24 LAB — BASIC METABOLIC PANEL
BUN/Creatinine Ratio: 24 (ref 12–28)
BUN: 22 mg/dL (ref 8–27)
CO2: 21 mmol/L (ref 20–29)
Calcium: 9.5 mg/dL (ref 8.7–10.3)
Chloride: 99 mmol/L (ref 96–106)
Creatinine, Ser: 0.93 mg/dL (ref 0.57–1.00)
Glucose: 137 mg/dL — ABNORMAL HIGH (ref 70–99)
Potassium: 4.3 mmol/L (ref 3.5–5.2)
Sodium: 139 mmol/L (ref 134–144)
eGFR: 60 mL/min/{1.73_m2} (ref >59)

## 2023-02-24 LAB — TSH: TSH: 2.09 u[IU]/mL (ref 0.450–4.500)

## 2023-02-24 LAB — BRAIN NATRIURETIC PEPTIDE: BNP: 81.8 pg/mL (ref 0.0–100.0)

## 2023-02-24 MED ORDER — BEVACIZUMAB CHEMO INJECTION 1.25MG/0.05ML SYRINGE FOR KALEIDOSCOPE
1.2500 mg | INTRAVITREAL | Status: AC | PRN
  Administered 2023-02-24: 1.25 mg via INTRAVITREAL

## 2023-02-26 ENCOUNTER — Encounter: Payer: Self-pay | Admitting: Internal Medicine

## 2023-02-27 ENCOUNTER — Telehealth: Payer: Self-pay | Admitting: Internal Medicine

## 2023-02-27 NOTE — Telephone Encounter (Signed)
Consistent with scarring from RT - no convincing progoression or need for immediate change in recs at this point

## 2023-02-27 NOTE — Telephone Encounter (Signed)
Pt is aware of results and voiced her understanding. Nothing further needed.  

## 2023-02-27 NOTE — Telephone Encounter (Signed)
Patient was here this week and saw Dr. Philippa Sicks like the chest x-ray results---[phone]

## 2023-02-27 NOTE — Telephone Encounter (Signed)
Pt would like the results of x-ray

## 2023-03-02 ENCOUNTER — Other Ambulatory Visit: Payer: Self-pay

## 2023-03-02 MED ORDER — PREDNISONE 10 MG PO TABS: ORAL_TABLET | ORAL | 0 refills | Status: DC

## 2023-03-02 NOTE — Telephone Encounter (Signed)
Receive a request from [facility] in [place] is this okay to refill for her

## 2023-03-11 NOTE — Progress Notes (Signed)
 [facility] - Clinic Note  03/24/2023   CHIEF COMPLAINT Patient presents for Retina Follow Up  HISTORY OF PRESENT ILLNESS: Zoe [name] is a 88 y.o. female who presents to the clinic today for:  HPI     Retina Follow Up   Patient presents with  Wet AMD.  In right eye.  This started 4 weeks ago.  Duration of 4 weeks.  Since onset it is stable.  I, the attending physician,  performed the HPI with the patient and updated documentation appropriately.        Comments   4 week retina follow up AMD OD and IVA OD pt is reporting no vision changes noticed she denies any flashes has some floaters she is using latanoprost ou at bedtime and dorz bid ou and brim ou bid        Last edited by Rennis Chris, MD on 03/24/2023  4:43 PM.    Pt states she had no problems after her first Avastin injection, she feels like the black spot in her central vision has faded   Referring physician: Diona Foley, MD [address] [place],  [place] [address]  HISTORICAL INFORMATION:  Selected notes from the MEDICAL RECORD NUMBER Referred by Dr. Zenaida Niece for concern of exu ARMD OD LEE:  Ocular Hx- PMH-   CURRENT MEDICATIONS: Current Outpatient Medications (Ophthalmic Drugs)  Medication Sig   dorzolamide (TRUSOPT) 2 % ophthalmic solution Place 1 drop into the right eye daily.   Travoprost, BAK Free, (TRAVATAN) 0.004 % SOLN ophthalmic solution Place 1 drop into both eyes at bedtime.   No current facility-administered medications for this visit. (Ophthalmic Drugs)   Current Outpatient Medications (Other)  Medication Sig   albuterol (PROVENTIL HFA;VENTOLIN HFA) 108 (90 BASE) MCG/ACT inhaler Inhale 2 puffs into the lungs every 6 (six) hours as needed for wheezing or shortness of breath.   allopurinol (ZYLOPRIM) 300 MG tablet Take 1 tablet (300 mg total) by mouth daily.   ALPRAZolam (XANAX) 0.5 MG tablet Take 0.5 mg by mouth 2 (two) times daily as needed for anxiety.   apixaban  (ELIQUIS) 5 MG TABS tablet Take 1 tablet (5 mg total) by mouth 2 (two) times daily.   APPLE CIDER VINEGAR PO Take 625 mg by mouth daily.   Biotin 5000 MCG TABS Take 5,000-10,000 mcg by mouth daily.   budesonide-formoterol (SYMBICORT) 80-4.5 MCG/ACT inhaler Take 2 puffs first thing in am and then another 2 puffs about 12 hours later.   calcium-vitamin D 250-100 MG-UNIT tablet Take 1 tablet by mouth daily.   Cholecalciferol (VITAMIN D-3) 125 MCG (5000 UT) TABS Take 5,000 Units by mouth daily.   Cranberry-Vitamin C (CRANBERRY CONCENTRATE/VITAMINC PO) Take 250 mg by mouth daily.   cyanocobalamin 2000 MCG tablet Take 5,000 mcg by mouth daily.   diclofenac Sodium (VOLTAREN) 1 % GEL Apply 2-4 g topically 4 (four) times daily. APPLY 2-4 GRAMS TOPICALLY FOUR TIMES DAILY Strength: 1 %   docusate sodium (COLACE) 100 MG capsule Take 100 mg by mouth daily.   doxepin (SINEQUAN) 100 MG capsule Take 1 capsule (100 mg total) by mouth at bedtime.   famotidine (PEPCID) 20 MG tablet One after supper   furosemide (LASIX) 40 MG tablet Take 1 tablet (40 mg total) by mouth daily. Take for 7 days then stop   gabapentin (NEURONTIN) 300 MG capsule Take 300 mg by mouth 4 (four) times daily as needed (pain).   ibuprofen (ADVIL) 800 MG tablet Take 800  mg by mouth every 6 (six) hours as needed.   lactulose (CHRONULAC) 10 GM/15ML solution 15 ml daily   levocetirizine (XYZAL) 5 MG tablet Take 5 mg by mouth every evening.   lubiprostone (AMITIZA) 24 MCG capsule Take 1 capsule (24 mcg total) by mouth 2 (two) times daily with a meal.   magnesium oxide (MAG-OX) 400 (240 Mg) MG tablet Take 400 mg by mouth every Monday, Wednesday, and Friday.   meloxicam (MOBIC) 15 MG tablet Take 15 mg by mouth daily.   metoprolol tartrate (LOPRESSOR) 25 MG tablet Take 0.5 tablets (12.5 mg total) by mouth 2 (two) times daily. (Patient taking differently: Take 25 mg by mouth at bedtime.)   omeprazole (PRILOSEC) 40 MG capsule Take 1 capsule (40 mg  total) by mouth daily.   Potassium 99 MG TABS Take 99 mg by mouth every Monday, Wednesday, and Friday.   pravastatin (PRAVACHOL) 20 MG tablet Take 20 mg by mouth as directed. 3 x week   predniSONE (DELTASONE) 10 MG tablet Take  4 each am x 2 days,   2 each am x 2 days,  1 each am x 2 days and stop   prochlorperazine (COMPAZINE) 10 MG tablet Take 1 tablet (10 mg total) by mouth every 6 (six) hours as needed for nausea or vomiting.   senna (SENOKOT) 8.6 MG tablet Take 5 tablets by mouth 2 (two) times daily.   traMADol (ULTRAM) 50 MG tablet Take 1 tablet (50 mg total) by mouth every 6 (six) hours as needed for moderate pain (pain score 4-6) (mild pain).   traZODone (DESYREL) 50 MG tablet Take 50-100 mg by mouth at bedtime.   No current facility-administered medications for this visit. (Other)   Facility-Administered Medications Ordered in Other Visits (Other)  Medication Route   regadenoson (LEXISCAN) injection SOLN 0.4 mg Intravenous   technetium tetrofosmin (TC-MYOVIEW) injection 31.4 millicurie Intravenous   REVIEW OF SYSTEMS: ROS   Positive for: Eyes, Respiratory, Allergic/Imm Last edited by Etheleen Mayhew, COT on 03/24/2023  1:16 PM.      ALLERGIES Allergies  Allergen Reactions   Codeine Other (See Comments)    Pains in abdominal area   Other     NOVACAINE     ITCHING Anti-depressants   Oxycodone     Abdominal pain, chest pain   Paxil [Paroxetine Hcl] Itching   Procaine Itching   PAST MEDICAL HISTORY Past Medical History:  Diagnosis Date   A-fib [facility])    pt went into A-fib during lung biopsies   Abdominal discomfort 11/02/2012   suspected ?UTI- PCP MD placed on Cipro   Anxiety    Arthritis    Asthma    Cancer (HCC)    skin CA   Choking due to phlegm    Chronic constipation    Depression    Dysrhythmia    tachycardia   Environmental allergies    Fibromyalgia    GERD (gastroesophageal reflux disease)    Hemorrhoids    Hiatal hernia     Hypercholesteremia    Hypertension    Sleep apnea    Past Surgical History:  Procedure Laterality Date   ANAL RECTAL MANOMETRY N/A 11/28/2013   Procedure: ANO RECTAL MANOMETRY;  Surgeon: Romie Levee, MD;  Location: [facility];  Service: Endoscopy;  Laterality: N/A;   APPENDECTOMY     BACK SURGERY     X2    BIOPSY  03/13/2021   Procedure: BIOPSY;  Surgeon: Malissa Hippo, MD;  Location: [facility];  Service: Endoscopy;;   BRONCHIAL BIOPSY  09/02/2022   Procedure: BRONCHIAL BIOPSIES;  Surgeon: Josephine Igo, DO;  Location: [facility];  Service: Pulmonary;;   BRONCHIAL BRUSHINGS  09/02/2022   Procedure: BRONCHIAL BRUSHINGS;  Surgeon: Josephine Igo, DO;  [facility]: [facility];  Service: Pulmonary;;   BRONCHIAL NEEDLE ASPIRATION BIOPSY  09/02/2022   Procedure: BRONCHIAL NEEDLE ASPIRATION BIOPSIES;  Surgeon: Josephine Igo, DO;  [facility]: [facility];  Service: Pulmonary;;   CHOLECYSTECTOMY     COLONOSCOPY  03/29/03   COLONOSCOPY N/A 12/09/2013   Procedure: COLONOSCOPY;  Surgeon: Malissa Hippo, MD;  [facility];  Service: Endoscopy;  Laterality: N/A;  730   COLONOSCOPY N/A 02/04/2017   Procedure: COLONOSCOPY;  Surgeon: Malissa Hippo, MD;  [facility];  Service: Endoscopy;  Laterality: N/A;  930   ENDOBRONCHIAL ULTRASOUND Bilateral 09/02/2022   Procedure: ENDOBRONCHIAL ULTRASOUND;  Surgeon: Josephine Igo, DO;  [facility];  Service: Pulmonary;  Laterality: Bilateral;   ESOPHAGEAL DILATION N/A 03/13/2021   Procedure: ESOPHAGEAL DILATION;  Surgeon: Malissa Hippo, MD;  [facility];  Service: Endoscopy;  Laterality: N/A;   ESOPHAGOGASTRODUODENOSCOPY (EGD) WITH PROPOFOL N/A 03/13/2021   Procedure: ESOPHAGOGASTRODUODENOSCOPY (EGD) WITH PROPOFOL;  Surgeon: Malissa Hippo, MD;  [facility];  Service: Endoscopy;  Laterality: N/A;  8:05   EYE SURGERY     bilateral cataract removal   INTERCOSTAL NERVE BLOCK Right 11/17/2022    Procedure: INTERCOSTAL NERVE BLOCK;  Surgeon: Corliss Skains, MD;  Location: [facility];  Service: Thoracic;  Laterality: Right;   IR KYPHO LUMBAR INC FX REDUCE BONE BX UNI/BIL CANNULATION INC/IMAGING  11/24/2019   IR KYPHO THORACIC WITH BONE BIOPSY  08/23/2019   IR KYPHO THORACIC WITH BONE BIOPSY  11/24/2019   IR RADIOLOGIST EVAL & MGMT  08/03/2019   IR RADIOLOGIST EVAL & MGMT  09/29/2019   IR RADIOLOGIST EVAL & MGMT  10/18/2019   IR RADIOLOGIST EVAL & MGMT  04/11/2020   NODE DISSECTION Right 11/17/2022   Procedure: NODE DISSECTION;  Surgeon: Corliss Skains, MD;  Location: [facility];  Service: Thoracic;  Laterality: Right;   SKIN CANCER EXCISION     RIGHT NECK     TONSILLECTOMY     T+A   TOTAL HIP ARTHROPLASTY Right 09/14/2012   Procedure: RIGHT TOTAL HIP ARTHROPLASTY ANTERIOR APPROACH and RIGHT KNEE STEROID INJECTION;  Surgeon: Kathryne Hitch, MD;  Location: [facility];  Service: Orthopedics;  Laterality: Right;   TOTAL HIP ARTHROPLASTY Left 11/05/2012   Procedure: LEFT TOTAL HIP ARTHROPLASTY ANTERIOR APPROACH;  Surgeon: Kathryne Hitch, MD;  Location: [facility];  Service: Orthopedics;  Laterality: Left;   UMBILICAL HERNIA REPAIR  04/26/03   JENKINS   UPPER GASTROINTESTINAL ENDOSCOPY  08/03/2009   NUR   UPPER GASTROINTESTINAL ENDOSCOPY  03/29/2003   EGD ED TCS   FAMILY HISTORY Family History  Problem Relation Age of Onset   Healthy Son    Healthy Son    Healthy Daughter    Colon cancer Neg Hx    SOCIAL HISTORY Social History   Tobacco Use   Smoking status: Former    Current packs/day: 0.00    Average packs/day: 0.5 packs/day for 50.0 years (25.0 ttl pk-yrs)    Types: Cigarettes    Start date: 09/07/1970    Quit date: 09/06/2020    Years since quitting: 2.5   Smokeless tobacco: Never  Vaping Use   Vaping status:  Never Used  Substance Use Topics   Alcohol use: Yes    Comment: 1-2 drinks per week   Drug use: No       OPHTHALMIC EXAM:  Base Eye Exam      Visual Acuity (Snellen - Linear)       Right Left   Dist [place] 20/100 -3 20/20 -2   Dist ph [place] 20/70          Tonometry (Tonopen, 1:25 PM)       Right Left   Pressure 12 14         Pupils       Pupils Dark Light Shape React APD   Right PERRL 3 2 Round Brisk None   Left PERRL 3 2 Round Brisk None         Visual Fields       Left Right    Full Full         Extraocular Movement       Right Left    Full, Ortho Full, Ortho         Neuro/Psych     Oriented x3: Yes   Mood/Affect: Normal         Dilation     Both eyes: 2.5% Phenylephrine @ 1:25 PM           Slit Lamp and Fundus Exam     Slit Lamp Exam       Right Left   Lids/Lashes Dermatochalasis - upper lid Dermatochalasis - upper lid   Conjunctiva/Sclera White and quiet White and quiet   Cornea well healed cataract wound, trace PEE, mild tear film debris mild arcus, mild tear film debris, well healed cataract wound   Anterior Chamber deep and clear deep and clear   Iris Round and dilated Round and dilated   Lens 3 piece posterior chamber intraocular lens with mild temporal displacement 3 piece posterior chamber intraocular lens in good position   Anterior Vitreous mild syneresis mild syneresis         Fundus Exam       Right Left   Disc mild Pallor, Sharp rim, temporal PPA mild Pallor, Sharp rim, temporal PPA   C/D Ratio 0.3 0.3   Macula Blunted foveal reflex, central CNV with +heme and edema -- slightly improved, Drusen, RPE mottling Flat, Blunted foveal reflex, Drusen, RPE mottling and clumping, No heme or edema   Vessels attenuated, Tortuous attenuated, mild tortuosity   Periphery Attached, pigmented paving stone degeneration inferiorly, No heme Attached, reticular degeneration, No heme           IMAGING AND PROCEDURES  Imaging and Procedures for 03/24/2023  OCT, Retina - OU - Both Eyes       Right Eye Quality was good. Central Foveal Thickness: 408. Progression has improved.  Findings include no IRF, no SRF, abnormal foveal contour, retinal drusen , subretinal hyper-reflective material, choroidal neovascular membrane, pigment epithelial detachment (central CNV with surrounding SRF / SRHM -- improved).   Left Eye Quality was good. Central Foveal Thickness: 230. Progression has been stable. Findings include normal foveal contour, no IRF, no SRF, retinal drusen (Mild patchy ORA).   Notes *Images captured and stored on drive  Diagnosis / Impression:  OD: Central CNV with surrounding SRF / SRHM -- improved OS: nonexudative ARMD w/ patchy ORA  Clinical management:  See below  Abbreviations: NFP - Normal foveal profile. CME - cystoid macular edema. PED - pigment epithelial detachment. IRF - intraretinal fluid. SRF -  subretinal fluid. EZ - ellipsoid zone. ERM - epiretinal membrane. ORA - outer retinal atrophy. ORT - outer retinal tubulation. SRHM - subretinal hyper-reflective material. IRHM - intraretinal hyper-reflective material      Intravitreal Injection, Pharmacologic Agent - OD - Right Eye       Time Out 03/24/2023. 2:12 PM. Confirmed correct patient, procedure, site, and patient consented.   Anesthesia Topical anesthesia was used. Anesthetic medications included Lidocaine 2%, Proparacaine 0.5%.   Procedure Preparation included 5% betadine to ocular surface, eyelid speculum. A supplied needle was used.   Injection: 1.25 mg Bevacizumab 1.25mg /0.73ml   Route: Intravitreal, Site: Right Eye   NDC: [id number], Lot: [id number], Expiration date: 05/03/2023   Post-op Post injection exam found visual acuity of at least counting fingers. The patient tolerated the procedure well. There were no complications. The patient received written and verbal post procedure care education.           ASSESSMENT/PLAN:   ICD-10-CM   1. Exudative age-related macular degeneration of right eye with active choroidal neovascularization (HCC)  H35.3211 OCT, Retina - OU - Both  Eyes    Intravitreal Injection, Pharmacologic Agent - OD - Right Eye    Bevacizumab (AVASTIN) SOLN 1.25 mg    CANCELED: Intravitreal Injection, Pharmacologic Agent - OD - Right Eye    2. 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 Essential hypertension  I10     4. Hypertensive retinopathy of both eyes  H35.033     5. Pseudophakia, both eyes  Z96.1      1. 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, OD  - s/p IVA OD #1 (01.28.25)  - OCT shows central CNV with surrounding SRF / SRHM -- improving  - exam shows central CNV w/ +heme -- slightly improved  - BCVA OD decreased to 20/70 from 20/50  - recommend IVA OD #2 today, 02.25.25 w/ f/u in 4 wks - pt wishes to be treated with IVA - RBA of procedure discussed, questions answered - IVA informed consent obtained and signed, 01.28.25 - see procedure note  - f/u in 4 wks -- DFE/OCT, possible injection   2. Age related macular degeneration, non-exudative, OS  - The incidence, anatomy, and pathology of dry AMD, risk of progression, and the AREDS and AREDS 2 study including smoking risks discussed with patient.  - Recommend amsler grid monitoring  - monitor  3,4. Hypertensive retinopathy OU - discussed importance of tight BP control - monitor  5. Pseudophakia OU  - s/p CE/IOL (Dr. Jettie Pagan)  - IOL in good position, doing well  - monitor  Ophthalmic Meds Ordered this visit:  Meds ordered this encounter  Medications   Bevacizumab (AVASTIN) SOLN 1.25 mg     Return in about 4 weeks (around 04/21/2023) for f/u exu ARMD OD, DFE, OCT, Possible Injxn.  There are no Patient Instructions on file for this visit.  Explained the diagnoses, plan, and follow up with the patient and they expressed understanding.  Patient expressed understanding of the importance of proper follow up care.   This document serves as a record of services personally performed by Karie Chimera, MD, PhD. It was  created on their behalf by Charlette Caffey, COT an ophthalmic technician. The creation of this record is the provider's dictation and/or activities during the visit.    Electronically signed by:  Charlette Caffey, COT  03/24/23 4:45 PM  This document serves as a record of services personally  performed by Karie Chimera, MD, PhD. It was created on their behalf by Glee Arvin. Manson Passey, OA an ophthalmic technician. The creation of this record is the provider's dictation and/or activities during the visit.    Electronically signed by: Glee Arvin. Manson Passey, OA 03/24/23 4:45 PM  Karie Chimera, M.D., Ph.D. Diseases & Surgery of the Retina and Vitreous [facility] 03/24/2023  I have reviewed the above documentation for accuracy and completeness, and I agree with the above. Karie Chimera, M.D., Ph.D. 03/24/23 4:46 PM   Abbreviations: M myopia (nearsighted); A astigmatism; H hyperopia (farsighted); P presbyopia; Mrx spectacle prescription;  CTL contact lenses; OD right eye; OS left eye; OU both eyes  XT exotropia; ET esotropia; PEK punctate epithelial keratitis; PEE punctate epithelial erosions; DES dry eye syndrome; MGD meibomian gland dysfunction; ATs artificial tears; PFAT's preservative free artificial tears; NSC nuclear sclerotic cataract; PSC posterior subcapsular cataract; ERM epi-retinal membrane; PVD posterior vitreous detachment; RD retinal detachment; DM diabetes mellitus; DR diabetic retinopathy; NPDR non-proliferative diabetic retinopathy; PDR proliferative diabetic retinopathy; CSME clinically significant macular edema; DME diabetic macular edema; dbh dot blot hemorrhages; CWS cotton wool spot; POAG primary open angle glaucoma; C/D cup-to-disc ratio; HVF humphrey visual field; GVF goldmann visual field; OCT optical coherence tomography; IOP intraocular pressure; BRVO Branch retinal vein occlusion; CRVO central retinal vein occlusion; CRAO central retinal artery occlusion;  BRAO branch retinal artery occlusion; RT retinal tear; SB scleral buckle; PPV pars plana vitrectomy; VH Vitreous hemorrhage; PRP panretinal laser photocoagulation; IVK intravitreal kenalog; VMT vitreomacular traction; MH Macular hole;  NVD neovascularization of the disc; NVE neovascularization elsewhere; AREDS age related eye disease study; ARMD age related macular degeneration; POAG primary open angle glaucoma; EBMD epithelial/anterior basement membrane dystrophy; ACIOL anterior chamber intraocular lens; IOL intraocular lens; PCIOL posterior chamber intraocular lens; Phaco/IOL phacoemulsification with intraocular lens placement; PRK photorefractive keratectomy; LASIK laser assisted in situ keratomileusis; HTN hypertension; DM diabetes mellitus; COPD chronic obstructive pulmonary disease

## 2023-03-17 ENCOUNTER — Telehealth: Payer: Self-pay | Admitting: Internal Medicine

## 2023-03-17 MED ORDER — PREDNISONE 10 MG PO TABS: ORAL_TABLET | ORAL | 0 refills | Status: DC

## 2023-03-17 NOTE — Telephone Encounter (Signed)
Spoke with patient's husband, Rosanne Ashing (DPR). He states she is very much improved from LOV, still has "spells" of coughing 6-8 xsdaily. She is doing a lot of throat clearing, seems to feel something is "stuck" in her throat. She has gotten relief when taking prednisone. She has been using Symbicort BID, taking Prilosec/Pepcid as directed, has been taking Delsym BID. She has not been taking Gabapentin daily and has not been using Tramadol. Reviewed recommendations for these medications. Husband states he will advise patient. Advised this is last round of prednisone we can send in, if cough continues she will need ROV to discuss other treatment options. He voiced understanding.   Rx sent to pharmacy.  Dr. Candyce Churn - thank you!  Prednisone: 02/23/23 #14 Sig: Take  4 each am x 2 days,   2 each am x 2 days,  1 each am x 2 days and stop   03/02/23 #14 Sig: Take  4 each am x 2 days,   2 each am x 2 days,  1 each am x 2 days and stop   03/17/23 #14 Sig: Take  4 each am x 2 days,   2 each am x 2 days,  1 each am x 2 days and stop

## 2023-03-17 NOTE — Telephone Encounter (Signed)
I called and spoke with pt. Pt states she is still coughing and tries not to cough. This wears her out and pt wants to know if you want to send in more prednisone or see her. The cough is "irritable" A dry cough. Pt is taking mucinex. Please advise.

## 2023-03-17 NOTE — Telephone Encounter (Signed)
Can do one more prednisone = Prednisone 10 mg take  4 each am x 2 days,   2 each am x 2 days,  1 each am x 2 days and stop but make sure she is actually following the other parts of her AVS and set up f/u with all meds in hand next available.

## 2023-03-17 NOTE — Telephone Encounter (Signed)
Patient has finished her initial prescription for Prednisone, but she is still coughing.  Please advise---patent call back [phone]

## 2023-03-23 DIAGNOSIS — J90 Pleural effusion, not elsewhere classified: Secondary | ICD-10-CM | POA: Diagnosis not present

## 2023-03-23 DIAGNOSIS — I700 Atherosclerosis of aorta: Secondary | ICD-10-CM | POA: Diagnosis not present

## 2023-03-23 DIAGNOSIS — C3491 Malignant neoplasm of unspecified part of right bronchus or lung: Secondary | ICD-10-CM | POA: Diagnosis not present

## 2023-03-23 DIAGNOSIS — J439 Emphysema, unspecified: Secondary | ICD-10-CM | POA: Diagnosis not present

## 2023-03-24 ENCOUNTER — Encounter (INDEPENDENT_AMBULATORY_CARE_PROVIDER_SITE_OTHER): Payer: Self-pay | Admitting: Ophthalmology

## 2023-03-24 ENCOUNTER — Ambulatory Visit (INDEPENDENT_AMBULATORY_CARE_PROVIDER_SITE_OTHER): Payer: HMO | Admitting: Ophthalmology

## 2023-03-24 DIAGNOSIS — Z961 Presence of intraocular lens: Secondary | ICD-10-CM | POA: Diagnosis not present

## 2023-03-24 DIAGNOSIS — H353211 Exudative age-related macular degeneration, right eye, with active choroidal neovascularization: Secondary | ICD-10-CM | POA: Diagnosis not present

## 2023-03-24 DIAGNOSIS — H35033 Hypertensive retinopathy, bilateral: Secondary | ICD-10-CM | POA: Diagnosis not present

## 2023-03-24 DIAGNOSIS — H353122 Nonexudative age-related macular degeneration, left eye, intermediate dry stage: Secondary | ICD-10-CM

## 2023-03-24 DIAGNOSIS — I10 Essential (primary) hypertension: Secondary | ICD-10-CM

## 2023-03-24 MED ORDER — BEVACIZUMAB CHEMO INJECTION 1.25MG/0.05ML SYRINGE FOR KALEIDOSCOPE
1.2500 mg | INTRAVITREAL | Status: AC | PRN
  Administered 2023-03-24: 1.25 mg via INTRAVITREAL

## 2023-03-25 ENCOUNTER — Other Ambulatory Visit: Payer: Self-pay

## 2023-03-25 DIAGNOSIS — C3490 Malignant neoplasm of unspecified part of unspecified bronchus or lung: Secondary | ICD-10-CM | POA: Diagnosis not present

## 2023-03-25 DIAGNOSIS — C3491 Malignant neoplasm of unspecified part of right bronchus or lung: Secondary | ICD-10-CM | POA: Diagnosis not present

## 2023-04-01 DIAGNOSIS — R5381 Other malaise: Secondary | ICD-10-CM | POA: Diagnosis not present

## 2023-04-01 DIAGNOSIS — G8929 Other chronic pain: Secondary | ICD-10-CM | POA: Diagnosis not present

## 2023-04-01 DIAGNOSIS — J90 Pleural effusion, not elsewhere classified: Secondary | ICD-10-CM | POA: Diagnosis not present

## 2023-04-01 DIAGNOSIS — H3530 Unspecified macular degeneration: Secondary | ICD-10-CM | POA: Diagnosis not present

## 2023-04-01 DIAGNOSIS — R2689 Other abnormalities of gait and mobility: Secondary | ICD-10-CM | POA: Diagnosis not present

## 2023-04-01 DIAGNOSIS — M25512 Pain in left shoulder: Secondary | ICD-10-CM | POA: Diagnosis not present

## 2023-04-01 DIAGNOSIS — K5909 Other constipation: Secondary | ICD-10-CM | POA: Diagnosis not present

## 2023-04-01 DIAGNOSIS — R29898 Other symptoms and signs involving the musculoskeletal system: Secondary | ICD-10-CM | POA: Diagnosis not present

## 2023-04-01 DIAGNOSIS — J439 Emphysema, unspecified: Secondary | ICD-10-CM | POA: Diagnosis not present

## 2023-04-01 DIAGNOSIS — C3411 Malignant neoplasm of upper lobe, right bronchus or lung: Secondary | ICD-10-CM | POA: Diagnosis not present

## 2023-04-01 DIAGNOSIS — C3491 Malignant neoplasm of unspecified part of right bronchus or lung: Secondary | ICD-10-CM | POA: Diagnosis not present

## 2023-04-01 DIAGNOSIS — I700 Atherosclerosis of aorta: Secondary | ICD-10-CM | POA: Diagnosis not present

## 2023-04-01 DIAGNOSIS — C3490 Malignant neoplasm of unspecified part of unspecified bronchus or lung: Secondary | ICD-10-CM | POA: Diagnosis not present

## 2023-04-02 ENCOUNTER — Encounter: Payer: Self-pay | Admitting: Internal Medicine

## 2023-04-05 NOTE — Progress Notes (Unsigned)
 Zoe [name], female    DOB: Dec 25, 1935    MRN: [id number]   Brief patient profile:  76 yowf  quit smoking Aug 2022 due to cough/wheezing but very little need for albuterol  referred to pulmonary clinic in [facility]  06/17/2022 by Zoe [name]  for copd eval.   History of Present Illness  06/17/2022  Pulmonary/ 1st [name] eval/ Zoe [name] / Zoe [name] on Advair or Trelegy and can't tell much difference between the two Chief Complaint  Patient presents with   Pulmonary Consult    Referred by Zoe. Doreen [name] for eval of COPD. Pt c/o cough for years. She has been coughing up large amounts of yellow to green sputum over the past 2-3 months.   Dyspnea:  pushes cart at [employer] /uses [facility] due to back / not breathing  Cough: yellowish / green off and on for months  Sleep: bed is flat  2 choking spells "though I was going to die"  in last few months prior to OV  resolved s heimlich "felt like spit was stuck" - takes otc ppi q d pc  SABA use: hfa every few days  02: none  Rec Stop advair/ trelegy (powder inhalers)  Plan A = Automatic = Always=    symbicort 80 Take 2 puffs first thing in am and then another 2 puffs about 12 hours later.  Work on inhaler technique: Plan B = Backup (to supplement plan A, not to replace it) Only use your albuterol inhaler as a rescue medication Pantoprazole (protonix) 40 mg   Take  30-60 min before first meal of the day and Pepcid (famotidine)  20 mg after supper until return to [name]  GERD diet reviewed, bed blocks rec  Please schedule a follow up [name] visit in 6 weeks, call sooner if needed with all respiratory medications /inhalers/ solutions in hand    Late add RUL nodule rx omnicef x 10 d then ct at 2 weeks   07/29/2022  f/u ov/Zoe [name]/Zoe [name] re: COPD gold ? / AB  maint on symbicort     f/u spn / did not bring meds   ? On 160 symb vs 80 Chief Complaint  Patient presents with   Follow-up  Dyspnea:  about the same  Cough: non productive  cough - says   trelegy helped cough up mucus better  Sleeping: bed is flat, no more choking spells  SABA use: rarely  02: none  Rec Plan A = Automatic = Always=  Try symbicort 80 Take 2 puffs first thing in am and then another 2 puffs about 12 hours later and take gabapentin for breakfast, supper and bedtime  If not better after a month then ok to change to Trelegy 100 one click each am  Plan B for breathing = Backup (to supplement plan A, not to replace it) Only use your albuterol inhaler as a rescue medication Plan C = Crisis (instead of Plan B but only if Plan B stops working) - only use your albuterol nebulizer if you first try Plan B  For cough/ congestion >   mucinex dm  up to maximum of  1200 mg every 12 hours as needed Continue Pantoprazole (protonix) 40 mg   Take  30-60 min before first meal of the day and Pepcid (famotidine)  20 mg after supper until return to [name] - this is the best way to tell whether stomach acid is contributing to your problem.  Depomedrol 120 mg IM   Jan  10th last RT    02/23/2023  ACUTE  ov/[place] [name]/Zoe [name] re: cough  maint on Trelegy   Chief Complaint  Patient presents with   Acute Visit  Dyspnea:  breathing worse x  weeks Cough: dry hack since April 2024, better since stopped trelegy last 2 d / cough to point of gagging Sleeping: bed is flat and uses wedge otherwise cough/ sob keep her up   SABA use: none  02: none  Rec     04/06/2023  f/u ov/[place] [name]/Zoe [name] re: *** maint on ***  No chief complaint on file.   Dyspnea:  *** Cough: *** Sleeping: ***   resp cc  SABA use: *** 02: ***  Lung cancer screening: ***   No obvious day to day or daytime variability or assoc excess/ purulent sputum or mucus plugs or hemoptysis or cp or chest tightness, subjective wheeze or overt sinus or hb symptoms.    Also denies any obvious fluctuation of symptoms with weather or environmental changes or other aggravating or alleviating factors except as outlined  above   No unusual exposure hx or h/o childhood pna/ asthma or knowledge of premature birth.  Current Allergies, Complete Past Medical History, Past Surgical History, Family History, and Social History were reviewed in [employer] record.  ROS  The following are not active complaints unless bolded Hoarseness, sore throat, dysphagia, dental problems, itching, sneezing,  nasal congestion or discharge of excess mucus or purulent secretions, ear ache,   fever, chills, sweats, unintended wt loss or wt gain, classically pleuritic or exertional cp,  orthopnea pnd or arm/hand swelling  or leg swelling, presyncope, palpitations, abdominal pain, anorexia, nausea, vomiting, diarrhea  or change in bowel habits or change in bladder habits, change in stools or change in urine, dysuria, hematuria,  rash, arthralgias, visual complaints, headache, numbness, weakness or ataxia or problems with walking or coordination,  change in mood or  memory.        No outpatient medications have been marked as taking for the 04/06/23 encounter (Appointment) with Zoe Cowden, MD.                     Past Medical History:  Diagnosis Date   Abdominal discomfort 11/02/2012   suspected ?UTI- PCP MD placed on Cipro   Anxiety    Arthritis    Asthma    Cancer (HCC)    skin CA   Chronic constipation    Depression    Dysrhythmia    tachycardia   Environmental allergies    Fibromyalgia    GERD (gastroesophageal reflux disease)    Hemorrhoids    Hypercholesteremia    Hypertension    Sleep apnea       Objective:    Wts  04/06/2023         ***       02/23/2023        168   07/29/22 172 lb (78 kg)  06/17/22 171 lb (77.6 kg)  06/11/22 170 lb (77.1 kg)    Vital signs reviewed  04/06/2023  - Note at rest 02 sats  ***% on ***   General appearance:    ***     Min bar*** Assessment

## 2023-04-06 ENCOUNTER — Ambulatory Visit: Payer: HMO | Admitting: Internal Medicine

## 2023-04-06 ENCOUNTER — Encounter: Payer: Self-pay | Admitting: Internal Medicine

## 2023-04-06 VITALS — BP 90/59 | HR 101 | Ht 64.0 in | Wt 161.8 lb

## 2023-04-06 DIAGNOSIS — J4520 Mild intermittent asthma, uncomplicated: Secondary | ICD-10-CM

## 2023-04-06 DIAGNOSIS — R0609 Other forms of dyspnea: Secondary | ICD-10-CM

## 2023-04-06 MED ORDER — BREZTRI AEROSPHERE 160-9-4.8 MCG/ACT IN AERO: 2.0000 | INHALATION_SPRAY | Freq: Two times a day (BID) | RESPIRATORY_TRACT | 11 refills | Status: DC

## 2023-04-06 NOTE — Patient Instructions (Addendum)
 Try breztri Take 2 puffs first thing in am and then another 2 puffs about 12 hours later.    Only use your albuterol as a rescue medication to be used if you can't catch your breath by resting or doing a relaxed purse lip breathing pattern.  - The less you use it, the better it will work when you need it. - Ok to use up to 2 puffs  every 4 hours if you must but call for immediate appointment if use goes up over your usual need - Don't leave home without it !!  (think of it like the spare tire for your car)   Increased gabapentin to  4 x daily to see if this dose helps the cough   Change delsym to mucinex dm 1200 mg every 12 hours as needed    Please schedule a follow up office visit in 6 weeks, call sooner if needed

## 2023-04-07 ENCOUNTER — Encounter: Payer: Self-pay | Admitting: Internal Medicine

## 2023-04-07 ENCOUNTER — Ambulatory Visit: Payer: HMO | Attending: Internal Medicine | Admitting: Internal Medicine

## 2023-04-07 VITALS — BP 94/60 | HR 96 | Ht 66.0 in | Wt 162.6 lb

## 2023-04-07 DIAGNOSIS — I959 Hypotension, unspecified: Secondary | ICD-10-CM | POA: Insufficient documentation

## 2023-04-07 DIAGNOSIS — I4891 Unspecified atrial fibrillation: Secondary | ICD-10-CM | POA: Diagnosis not present

## 2023-04-07 MED ORDER — METOPROLOL TARTRATE 25 MG PO TABS: 12.5000 mg | ORAL_TABLET | Freq: Every day | ORAL | Status: DC | PRN

## 2023-04-07 NOTE — Assessment & Plan Note (Addendum)
 Quit smoking 2022 due to cough/ wheeze worse since then  - 06/17/2022  After extensive coaching inhaler device,  effectiveness =  75% with hfa so try change dpi to symb 80 2bid as main problem is cough / choking / globus sensation and rx max gerd x 6 week trial > 07/29/2022 lung clear 07/29/2022 and no more noct choking but still globus/ freq throat clearing so rec depomedrol 120/ rechallenge with symb 80 2bid  - PFTs  10/07/22  wnl p Trelegy prior.  >>>> 02/23/2023  After extensive coaching inhaler device,  effectiveness =    75% with hfa > changed back to symbicort 80 2bid from trelegy for refractory cough p RT  >>>  04/06/2023 80% still coughing >  trial of breztri 2 bid    Group D (now reclassified as E) in terms of symptom/risk and laba/lama/ICS  therefore appropriate rx at this point >>>  breztri trial/ prn saba   Also rec titrate up gabapentin to 300 mg qid if tol to control cough.  Discussed in detail all the  indications, usual  risks and alternatives  relative to the benefits with patient who agrees to proceed with Rx as outlined.

## 2023-04-07 NOTE — Patient Instructions (Addendum)
 Medication Instructions:  Your physician has recommended you make the following change in your medication:  Take metoprolol tartrate 12.5 mg as needed if BP is greater than 100 as well as HR and having palpitations  Continue taking all other medications as prescribed  Labwork: None  Testing/Procedures: None  Follow-Up: Your physician recommends that you schedule a follow-up appointment in: 10 months  Any Other Special Instructions Will Be Listed Below (If Applicable). Thank you for choosing [facility]!     If you need a refill on your cardiac medications before your next appointment, please call your pharmacy.

## 2023-04-07 NOTE — Progress Notes (Signed)
 Cardiology Office Note  Date: 04/07/2023   ID: Zoe [name], DOB 1935/08/20, MRN [id number]  PCP:  Ignatius Specking, MD  Cardiologist:  Marjo Bicker, MD Electrophysiologist:  None   History of Present Illness: Zoe [name] is a 88 y.o. female known to have paroxysmal A-fib, chronic diastolic heart failure is here for follow-up visit.  Patient underwent lung surgery/lobectomy for lung cancer recently in 2024 October.  Doing well since then.  Home blood pressures have been low and heart rates are high, ranging between 90 and 110.  No symptoms of palpitations, fatigue, SOB or chest pain.  No leg swelling.  EKG today showed normal sinus rhythm.   Past Medical History:  Diagnosis Date   A-fib [facility])    pt went into A-fib during lung biopsies   Abdominal discomfort 11/02/2012   suspected ?UTI- PCP MD placed on Cipro   Anxiety    Arthritis    Asthma    Cancer (HCC)    skin CA   Choking due to phlegm    Chronic constipation    Depression    Dysrhythmia    tachycardia   Environmental allergies    Fibromyalgia    GERD (gastroesophageal reflux disease)    Hemorrhoids    Hiatal hernia    Hypercholesteremia    Hypertension    Sleep apnea     Past Surgical History:  Procedure Laterality Date   ANAL RECTAL MANOMETRY N/A 11/28/2013   Procedure: ANO RECTAL MANOMETRY;  Surgeon: Romie Levee, MD;  Location: [facility];  Service: Endoscopy;  Laterality: N/A;   APPENDECTOMY     BACK SURGERY     X2    BIOPSY  03/13/2021   Procedure: BIOPSY;  Surgeon: Malissa Hippo, MD;  Location: [facility];  Service: Endoscopy;;   BRONCHIAL BIOPSY  09/02/2022   Procedure: BRONCHIAL BIOPSIES;  Surgeon: Josephine Igo, DO;  Location: [facility];  Service: Pulmonary;;   BRONCHIAL BRUSHINGS  09/02/2022   Procedure: BRONCHIAL BRUSHINGS;  Surgeon: Josephine Igo, DO;  Location: [facility];  Service: Pulmonary;;   BRONCHIAL NEEDLE ASPIRATION BIOPSY  09/02/2022   Procedure:  BRONCHIAL NEEDLE ASPIRATION BIOPSIES;  Surgeon: Josephine Igo, DO;  Location: [facility];  Service: Pulmonary;;   CHOLECYSTECTOMY     COLONOSCOPY  03/29/03   COLONOSCOPY N/A 12/09/2013   Procedure: COLONOSCOPY;  Surgeon: Malissa Hippo, MD;  [facility]: [facility];  Service: Endoscopy;  Laterality: N/A;  730   COLONOSCOPY N/A 02/04/2017   Procedure: COLONOSCOPY;  Surgeon: Malissa Hippo, MD;  [facility]: [facility];  Service: Endoscopy;  Laterality: N/A;  930   ENDOBRONCHIAL ULTRASOUND Bilateral 09/02/2022   Procedure: ENDOBRONCHIAL ULTRASOUND;  Surgeon: Josephine Igo, DO;  [facility]: [facility];  Service: Pulmonary;  Laterality: Bilateral;   ESOPHAGEAL DILATION N/A 03/13/2021   Procedure: ESOPHAGEAL DILATION;  Surgeon: Malissa Hippo, MD;  [facility];  Service: Endoscopy;  Laterality: N/A;   ESOPHAGOGASTRODUODENOSCOPY (EGD) WITH PROPOFOL N/A 03/13/2021   Procedure: ESOPHAGOGASTRODUODENOSCOPY (EGD) WITH PROPOFOL;  Surgeon: Malissa Hippo, MD;  [facility];  Service: Endoscopy;  Laterality: N/A;  8:05   EYE SURGERY     bilateral cataract removal   INTERCOSTAL NERVE BLOCK Right 11/17/2022   Procedure: INTERCOSTAL NERVE BLOCK;  Surgeon: Corliss Skains, MD;  Location: [facility];  Service: Thoracic;  Laterality: Right;   IR KYPHO LUMBAR INC FX REDUCE BONE BX UNI/BIL CANNULATION INC/IMAGING  11/24/2019  IR KYPHO THORACIC WITH BONE BIOPSY  08/23/2019   IR KYPHO THORACIC WITH BONE BIOPSY  11/24/2019   IR RADIOLOGIST EVAL & MGMT  08/03/2019   IR RADIOLOGIST EVAL & MGMT  09/29/2019   IR RADIOLOGIST EVAL & MGMT  10/18/2019   IR RADIOLOGIST EVAL & MGMT  04/11/2020   NODE DISSECTION Right 11/17/2022   Procedure: NODE DISSECTION;  Surgeon: Corliss Skains, MD;  Location: [facility];  Service: Thoracic;  Laterality: Right;   SKIN CANCER EXCISION     RIGHT NECK     TONSILLECTOMY     T+A   TOTAL HIP ARTHROPLASTY Right 09/14/2012   Procedure: RIGHT TOTAL HIP ARTHROPLASTY  ANTERIOR APPROACH and RIGHT KNEE STEROID INJECTION;  Surgeon: Kathryne Hitch, MD;  Location: [facility] OR;  Service: Orthopedics;  Laterality: Right;   TOTAL HIP ARTHROPLASTY Left 11/05/2012   Procedure: LEFT TOTAL HIP ARTHROPLASTY ANTERIOR APPROACH;  Surgeon: Kathryne Hitch, MD;  Location: [facility];  Service: Orthopedics;  Laterality: Left;   UMBILICAL HERNIA REPAIR  04/26/03   JENKINS   UPPER GASTROINTESTINAL ENDOSCOPY  08/03/2009   NUR   UPPER GASTROINTESTINAL ENDOSCOPY  03/29/2003   EGD ED TCS    Current Outpatient Medications  Medication Sig Dispense Refill   albuterol (PROVENTIL HFA;VENTOLIN HFA) 108 (90 BASE) MCG/ACT inhaler Inhale 2 puffs into the lungs every 6 (six) hours as needed for wheezing or shortness of breath.     allopurinol (ZYLOPRIM) 300 MG tablet Take 1 tablet (300 mg total) by mouth daily. 90 tablet 1   ALPRAZolam (XANAX) 0.5 MG tablet Take 0.5 mg by mouth 2 (two) times daily as needed for anxiety.     apixaban (ELIQUIS) 5 MG TABS tablet Take 1 tablet (5 mg total) by mouth 2 (two) times daily. 180 tablet 1   APPLE CIDER VINEGAR PO Take 625 mg by mouth daily.     Biotin 5000 MCG TABS Take 5,000-10,000 mcg by mouth daily.     Budeson-Glycopyrrol-Formoterol (BREZTRI AEROSPHERE) 160-9-4.8 MCG/ACT AERO Inhale 2 puffs into the lungs 2 (two) times daily. 10.7 g 11   calcium-vitamin D 250-100 MG-UNIT tablet Take 1 tablet by mouth daily.     Cholecalciferol (VITAMIN D-3) 125 MCG (5000 UT) TABS Take 5,000 Units by mouth daily.     Cranberry-Vitamin C (CRANBERRY CONCENTRATE/VITAMINC PO) Take 250 mg by mouth daily.     cyanocobalamin 2000 MCG tablet Take 5,000 mcg by mouth daily.     diclofenac Sodium (VOLTAREN) 1 % GEL Apply 2-4 g topically 4 (four) times daily. APPLY 2-4 GRAMS TOPICALLY FOUR TIMES DAILY Strength: 1 % 200 g 3   dorzolamide (TRUSOPT) 2 % ophthalmic solution Place 1 drop into the right eye daily.     doxepin (SINEQUAN) 100 MG capsule Take 1 capsule (100 mg  total) by mouth at bedtime. 90 capsule 1   famotidine (PEPCID) 20 MG tablet One after supper 30 tablet 11   gabapentin (NEURONTIN) 300 MG capsule Take 300 mg by mouth 4 (four) times daily as needed (pain).     ibuprofen (ADVIL) 800 MG tablet Take 800 mg by mouth every 6 (six) hours as needed.     lactulose (CHRONULAC) 10 GM/15ML solution 15 ml daily     lubiprostone (AMITIZA) 24 MCG capsule Take 1 capsule (24 mcg total) by mouth 2 (two) times daily with a meal. 180 capsule 3   magnesium oxide (MAG-OX) 400 (240 Mg) MG tablet Take 400 mg by mouth every Monday, Wednesday, and Friday.  meloxicam (MOBIC) 15 MG tablet Take 15 mg by mouth daily.     metoprolol tartrate (LOPRESSOR) 25 MG tablet Take 0.5 tablets (12.5 mg total) by mouth 2 (two) times daily. (Patient taking differently: Take 25 mg by mouth at bedtime.) 135 tablet 2   Potassium 99 MG TABS Take 99 mg by mouth every Monday, Wednesday, and Friday.     senna (SENOKOT) 8.6 MG tablet Take 5 tablets by mouth 2 (two) times daily.     Travoprost, BAK Free, (TRAVATAN) 0.004 % SOLN ophthalmic solution Place 1 drop into both eyes at bedtime.     No current facility-administered medications for this visit.   Facility-Administered Medications Ordered in Other Visits  Medication Dose Route Frequency Provider Last Rate Last Admin   regadenoson (LEXISCAN) injection SOLN 0.4 mg  0.4 mg Intravenous Once Hilty, Lisette Abu, MD       technetium tetrofosmin (TC-MYOVIEW) injection 31.4 millicurie  31.4 millicurie Intravenous Once PRN Hilty, Lisette Abu, MD       Allergies:  Codeine, Other, Oxycodone, Paxil [paroxetine hcl], and Procaine   Social History: The patient  reports that she quit smoking about 2 years ago. Her smoking use included cigarettes. She started smoking about 52 years ago. She has a 25 pack-year smoking history. She has never used smokeless tobacco. She reports current alcohol use. She reports that she does not use drugs.   Family History:  The patient's family history includes Healthy in her daughter, son, and son.   ROS:  Please see the history of present illness. Otherwise, complete review of systems is positive for none  All other systems are reviewed and negative.   Physical Exam: VS:  BP 94/60   Pulse 96   Ht 5\' 6"  (1.676 m)   Wt 162 lb 9.6 oz (73.8 kg)   SpO2 96%   BMI 26.24 kg/m , BMI Body mass index is 26.24 kg/m.  Wt Readings from Last 3 Encounters:  04/07/23 162 lb 9.6 oz (73.8 kg)  04/06/23 161 lb 12.8 oz (73.4 kg)  02/23/23 168 lb (76.2 kg)    General: Patient appears comfortable at rest. HEENT: Conjunctiva and lids normal, oropharynx clear with moist mucosa. Neck: Supple, no elevated JVP or carotid bruits, no thyromegaly. Lungs: Clear to auscultation, nonlabored breathing at rest. Cardiac: Regular rate and rhythm, no S3 or significant systolic murmur, no pericardial rub. Abdomen: Soft, nontender, no hepatomegaly, bowel sounds present, no guarding or rebound. Extremities: No pitting edema, distal pulses 2+. Skin: Warm and dry. Musculoskeletal: No kyphosis. Neuropsychiatric: Alert and oriented x3, affect grossly appropriate.  Recent Labwork: 01/12/2023: ALT 10; AST 14; Magnesium 1.7 02/23/2023: BNP 81.8; BUN 22; Creatinine, Ser 0.93; Hemoglobin 11.6; Platelets 224; Potassium 4.3; Sodium 139; TSH 2.090     Component Value Date/Time   CHOL 155 03/29/2020 1146   TRIG 150 (H) 03/29/2020 1146   HDL 49 03/29/2020 1146   CHOLHDL 3.2 03/29/2020 1146   LDLCALC 80 03/29/2020 1146     Assessment and Plan:  Hypotension likely secondary dehydration: Discontinue metoprolol.  She is symptomatic with dizziness and elevated heart rates at home.  Paroxysmal A-fib: EKG today showed normal sinus rhythm.  Prior EKG from 8/24 showed atrial fibrillation, rate controlled.  Due to ongoing dizziness and low blood pressures, will stop metoprolol.  She can take metoprolol as needed for HR more than 100 and palpitations  provided her BP is more than 100 mmHg SBP.  Continue Eliquis 5 mg twice daily.  Refused sleep  study.  No prior history of CVA/TIA.  Chronic diastolic heart failure, compensated: No intervention.  HLD: Continue atorvastatin 20 mg at bedtime, goal LDL less than 100.  Can follow-up with PCP.       Medication Adjustments/Labs and Tests Ordered: Current medicines are reviewed at length with the patient today.  Concerns regarding medicines are outlined above.    Disposition:  Follow up 1 year  Signed Micahel Omlor Verne Spurr, MD, 04/07/2023 1:56 PM    [facility] at [facility] [address] [place], [place], [place] [address]

## 2023-04-09 ENCOUNTER — Other Ambulatory Visit: Payer: Self-pay

## 2023-04-09 NOTE — Progress Notes (Signed)
 [facility] - Clinic Note  04/21/2023   CHIEF COMPLAINT Patient presents for Retina Follow Up  HISTORY OF PRESENT ILLNESS: Zoe [name] is a 88 y.o. female who presents to the clinic today for:  HPI     Retina Follow Up   Patient presents with  Wet AMD.  In right eye.  This started 4 weeks ago.  I, the attending physician,  performed the HPI with the patient and updated documentation appropriately.        Comments   Patient here for 4 weeks retina follow up for exu ARMD OD. Patient states vision off/on. Some days same. Some days worse. No eye pain now. OD gets irritated. Using drops.       Last edited by Rennis Chris, MD on 04/21/2023  5:36 PM.    Pt states she doesn't feel like her vision is getting any better with the injections, she saw floaters after her last injection   Referring physician: Diona Foley, MD [address] [place],  [place] [address]  HISTORICAL INFORMATION:  Selected notes from the MEDICAL RECORD NUMBER Referred by Dr. Zenaida Niece for concern of exu ARMD OD LEE:  Ocular Hx- PMH-   CURRENT MEDICATIONS: Current Outpatient Medications (Ophthalmic Drugs)  Medication Sig   dorzolamide (TRUSOPT) 2 % ophthalmic solution Place 1 drop into the right eye daily.   Travoprost, BAK Free, (TRAVATAN) 0.004 % SOLN ophthalmic solution Place 1 drop into both eyes at bedtime.   No current facility-administered medications for this visit. (Ophthalmic Drugs)   Current Outpatient Medications (Other)  Medication Sig   albuterol (PROVENTIL HFA;VENTOLIN HFA) 108 (90 BASE) MCG/ACT inhaler Inhale 2 puffs into the lungs every 6 (six) hours as needed for wheezing or shortness of breath.   allopurinol (ZYLOPRIM) 300 MG tablet Take 1 tablet (300 mg total) by mouth daily.   ALPRAZolam (XANAX) 0.5 MG tablet Take 0.5 mg by mouth 2 (two) times daily as needed for anxiety.   apixaban (ELIQUIS) 5 MG TABS tablet Take 1 tablet (5 mg total) by mouth 2 (two) times  daily.   APPLE CIDER VINEGAR PO Take 625 mg by mouth daily.   Biotin 5000 MCG TABS Take 5,000-10,000 mcg by mouth daily.   Budeson-Glycopyrrol-Formoterol (BREZTRI AEROSPHERE) 160-9-4.8 MCG/ACT AERO Inhale 2 puffs into the lungs 2 (two) times daily.   calcium-vitamin D 250-100 MG-UNIT tablet Take 1 tablet by mouth daily.   Cholecalciferol (VITAMIN D-3) 125 MCG (5000 UT) TABS Take 5,000 Units by mouth daily.   Cranberry-Vitamin C (CRANBERRY CONCENTRATE/VITAMINC PO) Take 250 mg by mouth daily.   cyanocobalamin 2000 MCG tablet Take 5,000 mcg by mouth daily.   diclofenac Sodium (VOLTAREN) 1 % GEL Apply 2-4 g topically 4 (four) times daily. APPLY 2-4 GRAMS TOPICALLY FOUR TIMES DAILY Strength: 1 %   doxepin (SINEQUAN) 100 MG capsule Take 1 capsule (100 mg total) by mouth at bedtime.   famotidine (PEPCID) 20 MG tablet One after supper   gabapentin (NEURONTIN) 300 MG capsule Take 300 mg by mouth 4 (four) times daily as needed (pain).   ibuprofen (ADVIL) 800 MG tablet Take 800 mg by mouth every 6 (six) hours as needed.   lactulose (CHRONULAC) 10 GM/15ML solution 15 ml daily   lubiprostone (AMITIZA) 24 MCG capsule Take 1 capsule (24 mcg total) by mouth 2 (two) times daily with a meal.   magnesium oxide (MAG-OX) 400 (240 Mg) MG tablet Take 400 mg by mouth every Monday, Wednesday, and Friday.  meloxicam (MOBIC) 15 MG tablet Take 15 mg by mouth daily.   metoprolol tartrate (LOPRESSOR) 25 MG tablet Take 0.5 tablets (12.5 mg total) by mouth daily as needed. If BP and HR is greater than 100 and for palpitations   Potassium 99 MG TABS Take 99 mg by mouth every Monday, Wednesday, and Friday.   senna (SENOKOT) 8.6 MG tablet Take 5 tablets by mouth 2 (two) times daily.   No current facility-administered medications for this visit. (Other)   Facility-Administered Medications Ordered in Other Visits (Other)  Medication Route   regadenoson (LEXISCAN) injection SOLN 0.4 mg Intravenous   technetium tetrofosmin  (TC-MYOVIEW) injection 31.4 millicurie Intravenous   REVIEW OF SYSTEMS: ROS   Positive for: Eyes, Respiratory, Allergic/Imm Last edited by Laddie Aquas, COA on 04/21/2023  1:33 PM.       ALLERGIES Allergies  Allergen Reactions   Codeine Other (See Comments)    Pains in abdominal area   Other     NOVACAINE     ITCHING Anti-depressants   Oxycodone     Abdominal pain, chest pain   Paxil [Paroxetine Hcl] Itching   Procaine Itching   PAST MEDICAL HISTORY Past Medical History:  Diagnosis Date   A-fib [facility])    pt went into A-fib during lung biopsies   Abdominal discomfort 11/02/2012   suspected ?UTI- PCP MD placed on Cipro   Anxiety    Arthritis    Asthma    Cancer (HCC)    skin CA   Choking due to phlegm    Chronic constipation    Depression    Dysrhythmia    tachycardia   Environmental allergies    Fibromyalgia    GERD (gastroesophageal reflux disease)    Hemorrhoids    Hiatal hernia    Hypercholesteremia    Hypertension    Sleep apnea    Past Surgical History:  Procedure Laterality Date   ANAL RECTAL MANOMETRY N/A 11/28/2013   Procedure: ANO RECTAL MANOMETRY;  Surgeon: Romie Levee, MD;  Location: [facility];  Service: Endoscopy;  Laterality: N/A;   APPENDECTOMY     BACK SURGERY     X2    BIOPSY  03/13/2021   Procedure: BIOPSY;  Surgeon: Malissa Hippo, MD;  Location: [facility];  Service: Endoscopy;;   BRONCHIAL BIOPSY  09/02/2022   Procedure: BRONCHIAL BIOPSIES;  Surgeon: Josephine Igo, DO;  Location: [facility];  Service: Pulmonary;;   BRONCHIAL BRUSHINGS  09/02/2022   Procedure: BRONCHIAL BRUSHINGS;  Surgeon: Josephine Igo, DO;  [facility]: [facility];  Service: Pulmonary;;   BRONCHIAL NEEDLE ASPIRATION BIOPSY  09/02/2022   Procedure: BRONCHIAL NEEDLE ASPIRATION BIOPSIES;  Surgeon: Josephine Igo, DO;  [facility]: [facility];  Service: Pulmonary;;   CHOLECYSTECTOMY     COLONOSCOPY  03/29/03   COLONOSCOPY N/A 12/09/2013   Procedure:  COLONOSCOPY;  Surgeon: Malissa Hippo, MD;  [facility];  Service: Endoscopy;  Laterality: N/A;  730   COLONOSCOPY N/A 02/04/2017   Procedure: COLONOSCOPY;  Surgeon: Malissa Hippo, MD;  [facility];  Service: Endoscopy;  Laterality: N/A;  930   ENDOBRONCHIAL ULTRASOUND Bilateral 09/02/2022   Procedure: ENDOBRONCHIAL ULTRASOUND;  Surgeon: Josephine Igo, DO;  [facility];  Service: Pulmonary;  Laterality: Bilateral;   ESOPHAGEAL DILATION N/A 03/13/2021   Procedure: ESOPHAGEAL DILATION;  Surgeon: Malissa Hippo, MD;  [facility];  Service: Endoscopy;  Laterality: N/A;   ESOPHAGOGASTRODUODENOSCOPY (EGD) WITH PROPOFOL N/A 03/13/2021  Procedure: ESOPHAGOGASTRODUODENOSCOPY (EGD) WITH PROPOFOL;  Surgeon: Malissa Hippo, MD;  [facility];  Service: Endoscopy;  Laterality: N/A;  8:05   EYE SURGERY     bilateral cataract removal   INTERCOSTAL NERVE BLOCK Right 11/17/2022   Procedure: INTERCOSTAL NERVE BLOCK;  Surgeon: Corliss Skains, MD;  Location: [facility];  Service: Thoracic;  Laterality: Right;   IR KYPHO LUMBAR INC FX REDUCE BONE BX UNI/BIL CANNULATION INC/IMAGING  11/24/2019   IR KYPHO THORACIC WITH BONE BIOPSY  08/23/2019   IR KYPHO THORACIC WITH BONE BIOPSY  11/24/2019   IR RADIOLOGIST EVAL & MGMT  08/03/2019   IR RADIOLOGIST EVAL & MGMT  09/29/2019   IR RADIOLOGIST EVAL & MGMT  10/18/2019   IR RADIOLOGIST EVAL & MGMT  04/11/2020   NODE DISSECTION Right 11/17/2022   Procedure: NODE DISSECTION;  Surgeon: Corliss Skains, MD;  [facility];  Service: Thoracic;  Laterality: Right;   SKIN CANCER EXCISION     RIGHT NECK     TONSILLECTOMY     T+A   TOTAL HIP ARTHROPLASTY Right 09/14/2012   Procedure: RIGHT TOTAL HIP ARTHROPLASTY ANTERIOR APPROACH and RIGHT KNEE STEROID INJECTION;  Surgeon: Kathryne Hitch, MD;  Location: [facility];  Service: Orthopedics;  Laterality: Right;   TOTAL HIP ARTHROPLASTY Left 11/05/2012   Procedure:  LEFT TOTAL HIP ARTHROPLASTY ANTERIOR APPROACH;  Surgeon: Kathryne Hitch, MD;  Location: [facility];  Service: Orthopedics;  Laterality: Left;   UMBILICAL HERNIA REPAIR  04/26/03   JENKINS   UPPER GASTROINTESTINAL ENDOSCOPY  08/03/2009   NUR   UPPER GASTROINTESTINAL ENDOSCOPY  03/29/2003   EGD ED TCS   FAMILY HISTORY Family History  Problem Relation Age of Onset   Healthy Son    Healthy Son    Healthy Daughter    Colon cancer Neg Hx    SOCIAL HISTORY Social History   Tobacco Use   Smoking status: Former    Current packs/day: 0.00    Average packs/day: 0.5 packs/day for 50.0 years (25.0 ttl pk-yrs)    Types: Cigarettes    Start date: 09/07/1970    Quit date: 09/06/2020    Years since quitting: 2.6   Smokeless tobacco: Never  Vaping Use   Vaping status: Never Used  Substance Use Topics   Alcohol use: Yes    Comment: 1-2 drinks per week   Drug use: No       OPHTHALMIC EXAM:  Base Eye Exam     Visual Acuity (Snellen - Linear)       Right Left   Dist cc 20/250 +2 20/20 -1   Dist ph cc 20/200     Correction: Glasses         Tonometry (Tonopen, 1:30 PM)       Right Left   Pressure 10 09         Pupils       Dark Light Shape React APD   Right 4 3 Round Brisk None   Left 3 2 Round Brisk None         Visual Fields (Counting fingers)       Left Right    Full Full         Extraocular Movement       Right Left    Full, Ortho Full, Ortho         Neuro/Psych     Oriented x3: Yes   Mood/Affect: Normal  Dilation     Both eyes: 1.0% Mydriacyl, 2.5% Phenylephrine @ 1:29 PM           Slit Lamp and Fundus Exam     Slit Lamp Exam       Right Left   Lids/Lashes Dermatochalasis - upper lid Dermatochalasis - upper lid   Conjunctiva/Sclera White and quiet White and quiet   Cornea well healed cataract wound, trace PEE, mild tear film debris mild arcus, mild tear film debris, well healed cataract wound   Anterior Chamber deep  and clear deep and clear   Iris Round and dilated Round and dilated   Lens 3 piece posterior chamber intraocular lens with mild temporal displacement 3 piece posterior chamber intraocular lens in good position   Anterior Vitreous mild syneresis mild syneresis         Fundus Exam       Right Left   Disc mild Pallor, Sharp rim, temporal PPA mild Pallor, Sharp rim, temporal PPA   C/D Ratio 0.3 0.3   Macula Blunted foveal reflex, central CNV with +heme and edema -- slightly improved, Drusen, RPE mottling, RPE rip with atrophy Flat, Blunted foveal reflex, Drusen, RPE mottling and clumping, No heme or edema   Vessels attenuated, Tortuous attenuated, mild tortuosity   Periphery Attached, pigmented paving stone degeneration inferiorly, No heme Attached, reticular degeneration, No heme           IMAGING AND PROCEDURES  Imaging and Procedures for 04/21/2023  OCT, Retina - OU - Both Eyes       Right Eye Quality was good. Central Foveal Thickness: 507. Progression has improved. Findings include no IRF, abnormal foveal contour, retinal drusen , subretinal hyper-reflective material, choroidal neovascular membrane, pigment epithelial detachment, subretinal fluid (central CNV with surrounding SRF / SRHM -- improved; RPE rip).   Left Eye Quality was good. Central Foveal Thickness: 230. Progression has been stable. Findings include normal foveal contour, no IRF, no SRF, retinal drusen , pigment epithelial detachment (Mild patchy ORA).   Notes *Images captured and stored on drive  Diagnosis / Impression:  OD: Central CNV with surrounding SRF / SRHM -- improved; RPE rip OS: nonexudative ARMD w/ patchy ORA  Clinical management:  See below  Abbreviations: NFP - Normal foveal profile. CME - cystoid macular edema. PED - pigment epithelial detachment. IRF - intraretinal fluid. SRF - subretinal fluid. EZ - ellipsoid zone. ERM - epiretinal membrane. ORA - outer retinal atrophy. ORT - outer retinal  tubulation. SRHM - subretinal hyper-reflective material. IRHM - intraretinal hyper-reflective material      Intravitreal Injection, Pharmacologic Agent - OD - Right Eye       Time Out 04/21/2023. 1:54 PM. Confirmed correct patient, procedure, site, and patient consented.   Anesthesia Topical anesthesia was used. Anesthetic medications included Lidocaine 2%, Proparacaine 0.5%.   Procedure Preparation included 5% betadine to ocular surface, eyelid speculum. A supplied needle was used.   Injection: 1.25 mg Bevacizumab 1.25mg /0.38ml   Route: Intravitreal, Site: Right Eye   NDC: [id number], Lot: [id number], Expiration date: 05/31/2023   Post-op Post injection exam found visual acuity of at least counting fingers. The patient tolerated the procedure well. There were no complications. The patient received written and verbal post procedure care education.           ASSESSMENT/PLAN:   ICD-10-CM   1. Exudative age-related macular degeneration of right eye with active choroidal neovascularization (HCC)  H35.3211 OCT, Retina - OU - Both Eyes    Intravitreal  Injection, Pharmacologic Agent - OD - Right Eye    Bevacizumab (AVASTIN) SOLN 1.25 mg    2. 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 Essential hypertension  I10     4. Hypertensive retinopathy of both eyes  H35.033     5. Pseudophakia, both eyes  Z96.1      1. 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, OD  - s/p IVA OD #1 (01.28.25), #2 (01.28.25)  - exam shows central CNV w/ +heme -- improving  - BCVA OD 20/200 from 20/100  - OCT shows central CNV with surrounding SRF / SRHM -- improving; RPE rip - recommend IVA OD #3 today, 03.25.25 w/ f/u in 4 wks - pt wishes to be treated with IVA - RBA of procedure discussed, questions answered - IVA informed consent obtained and signed, 01.28.25 - see procedure note  - f/u in 4 wks -- DFE/OCT, possible injection   2. Age related  macular degeneration, non-exudative, OS  - The incidence, anatomy, and pathology of dry AMD, risk of progression, and the AREDS and AREDS 2 study including smoking risks discussed with patient.  - Recommend amsler grid monitoring  - monitor  3,4. Hypertensive retinopathy OU - discussed importance of tight BP control - monitor  5. Pseudophakia OU  - s/p CE/IOL (Dr. Jettie Pagan)  - IOL in good position, doing well  - monitor  Ophthalmic Meds Ordered this visit:  Meds ordered this encounter  Medications   Bevacizumab (AVASTIN) SOLN 1.25 mg     Return in about 4 weeks (around 05/19/2023) for f/u exu ARMD OD, DFE, OCT, Possible Injxn.  There are no Patient Instructions on file for this visit.  Explained the diagnoses, plan, and follow up with the patient and they expressed understanding.  Patient expressed understanding of the importance of proper follow up care.   This document serves as a record of services personally performed by Karie Chimera, MD, PhD. It was created on their behalf by Charlette Caffey, COT an ophthalmic technician. The creation of this record is the provider's dictation and/or activities during the visit.    Electronically signed by:  Charlette Caffey, COT  04/21/23 5:37 PM  This document serves as a record of services personally performed by Karie Chimera, MD, PhD. It was created on their behalf by Glee Arvin. Manson Passey, OA an ophthalmic technician. The creation of this record is the provider's dictation and/or activities during the visit.    Electronically signed by: Glee Arvin. Kristopher Oppenheim 04/21/23 5:37 PM  Karie Chimera, M.D., Ph.D. Diseases & Surgery of the Retina and Vitreous [facility] 04/21/2023  I have reviewed the above documentation for accuracy and completeness, and I agree with the above. Karie Chimera, M.D., Ph.D. 04/21/23 5:39 PM   Abbreviations: M myopia (nearsighted); A astigmatism; H hyperopia (farsighted); P presbyopia;  Mrx spectacle prescription;  CTL contact lenses; OD right eye; OS left eye; OU both eyes  XT exotropia; ET esotropia; PEK punctate epithelial keratitis; PEE punctate epithelial erosions; DES dry eye syndrome; MGD meibomian gland dysfunction; ATs artificial tears; PFAT's preservative free artificial tears; NSC nuclear sclerotic cataract; PSC posterior subcapsular cataract; ERM epi-retinal membrane; PVD posterior vitreous detachment; RD retinal detachment; DM diabetes mellitus; DR diabetic retinopathy; NPDR non-proliferative diabetic retinopathy; PDR proliferative diabetic retinopathy; CSME clinically significant macular edema; DME diabetic macular edema; dbh dot blot hemorrhages; CWS cotton wool spot; POAG primary open angle glaucoma;  C/D cup-to-disc ratio; HVF humphrey visual field; GVF goldmann visual field; OCT optical coherence tomography; IOP intraocular pressure; BRVO Branch retinal vein occlusion; CRVO central retinal vein occlusion; CRAO central retinal artery occlusion; BRAO branch retinal artery occlusion; RT retinal tear; SB scleral buckle; PPV pars plana vitrectomy; VH Vitreous hemorrhage; PRP panretinal laser photocoagulation; IVK intravitreal kenalog; VMT vitreomacular traction; MH Macular hole;  NVD neovascularization of the disc; NVE neovascularization elsewhere; AREDS age related eye disease study; ARMD age related macular degeneration; POAG primary open angle glaucoma; EBMD epithelial/anterior basement membrane dystrophy; ACIOL anterior chamber intraocular lens; IOL intraocular lens; PCIOL posterior chamber intraocular lens; Phaco/IOL phacoemulsification with intraocular lens placement; PRK photorefractive keratectomy; LASIK laser assisted in situ keratomileusis; HTN hypertension; DM diabetes mellitus; COPD chronic obstructive pulmonary disease

## 2023-04-15 DIAGNOSIS — Z299 Encounter for prophylactic measures, unspecified: Secondary | ICD-10-CM | POA: Diagnosis not present

## 2023-04-15 DIAGNOSIS — J449 Chronic obstructive pulmonary disease, unspecified: Secondary | ICD-10-CM | POA: Diagnosis not present

## 2023-04-15 DIAGNOSIS — Z1339 Encounter for screening examination for other mental health and behavioral disorders: Secondary | ICD-10-CM | POA: Diagnosis not present

## 2023-04-15 DIAGNOSIS — Z7189 Other specified counseling: Secondary | ICD-10-CM | POA: Diagnosis not present

## 2023-04-15 DIAGNOSIS — C3411 Malignant neoplasm of upper lobe, right bronchus or lung: Secondary | ICD-10-CM | POA: Diagnosis not present

## 2023-04-15 DIAGNOSIS — H353211 Exudative age-related macular degeneration, right eye, with active choroidal neovascularization: Secondary | ICD-10-CM | POA: Diagnosis not present

## 2023-04-15 DIAGNOSIS — Z1331 Encounter for screening for depression: Secondary | ICD-10-CM | POA: Diagnosis not present

## 2023-04-15 DIAGNOSIS — I10 Essential (primary) hypertension: Secondary | ICD-10-CM | POA: Diagnosis not present

## 2023-04-15 DIAGNOSIS — C771 Secondary and unspecified malignant neoplasm of intrathoracic lymph nodes: Secondary | ICD-10-CM | POA: Diagnosis not present

## 2023-04-15 DIAGNOSIS — Z0000 Encounter for general adult medical examination without abnormal findings: Secondary | ICD-10-CM | POA: Diagnosis not present

## 2023-04-21 ENCOUNTER — Ambulatory Visit (INDEPENDENT_AMBULATORY_CARE_PROVIDER_SITE_OTHER): Payer: HMO | Admitting: Ophthalmology

## 2023-04-21 ENCOUNTER — Encounter (INDEPENDENT_AMBULATORY_CARE_PROVIDER_SITE_OTHER): Payer: Self-pay | Admitting: Ophthalmology

## 2023-04-21 DIAGNOSIS — I10 Essential (primary) hypertension: Secondary | ICD-10-CM

## 2023-04-21 DIAGNOSIS — H353122 Nonexudative age-related macular degeneration, left eye, intermediate dry stage: Secondary | ICD-10-CM

## 2023-04-21 DIAGNOSIS — Z961 Presence of intraocular lens: Secondary | ICD-10-CM

## 2023-04-21 DIAGNOSIS — H35033 Hypertensive retinopathy, bilateral: Secondary | ICD-10-CM | POA: Diagnosis not present

## 2023-04-21 DIAGNOSIS — H353211 Exudative age-related macular degeneration, right eye, with active choroidal neovascularization: Secondary | ICD-10-CM

## 2023-04-21 MED ORDER — BEVACIZUMAB CHEMO INJECTION 1.25MG/0.05ML SYRINGE FOR KALEIDOSCOPE
1.2500 mg | INTRAVITREAL | Status: AC | PRN
  Administered 2023-04-21: 1.25 mg via INTRAVITREAL

## 2023-04-22 ENCOUNTER — Other Ambulatory Visit: Payer: Self-pay

## 2023-04-23 DIAGNOSIS — L853 Xerosis cutis: Secondary | ICD-10-CM | POA: Diagnosis not present

## 2023-04-23 DIAGNOSIS — D692 Other nonthrombocytopenic purpura: Secondary | ICD-10-CM | POA: Diagnosis not present

## 2023-04-23 DIAGNOSIS — L812 Freckles: Secondary | ICD-10-CM | POA: Diagnosis not present

## 2023-04-23 DIAGNOSIS — L821 Other seborrheic keratosis: Secondary | ICD-10-CM | POA: Diagnosis not present

## 2023-04-27 ENCOUNTER — Ambulatory Visit: Payer: Self-pay

## 2023-04-27 MED ORDER — PANTOPRAZOLE SODIUM 40 MG PO TBEC: DELAYED_RELEASE_TABLET | ORAL | 2 refills | Status: DC

## 2023-04-27 MED ORDER — PANTOPRAZOLE SODIUM 40 MG PO TBEC
DELAYED_RELEASE_TABLET | ORAL | 2 refills | Status: DC
Start: 2023-04-27 — End: 2023-04-27

## 2023-04-27 MED ORDER — PANTOPRAZOLE SODIUM 40 MG PO TBEC: 40.0000 mg | DELAYED_RELEASE_TABLET | Freq: Every day | ORAL | 2 refills | Status: DC

## 2023-04-27 MED ORDER — BUDESONIDE-FORMOTEROL FUMARATE 80-4.5 MCG/ACT IN AERO: 2.0000 | INHALATION_SPRAY | Freq: Two times a day (BID) | RESPIRATORY_TRACT | 12 refills | Status: DC

## 2023-04-27 NOTE — Telephone Encounter (Signed)
 Add protonix 40 mg Take 30-60 min before first meal of the day   Ok to try symbicort 80 2bid and bring all active meds to f/u ov

## 2023-04-27 NOTE — Addendum Note (Signed)
 Addended by: Shelby Dubin on: 04/27/2023 04:38 PM   Modules accepted: Orders

## 2023-04-27 NOTE — Telephone Encounter (Signed)
 Spoke with patient and advised. Rxs sent to pharmacy.

## 2023-04-27 NOTE — Telephone Encounter (Signed)
 Pt reports she has not seen any improvement in the wheezing/chest congestion and does not feel like the Markus Daft is effective. Pt notes she used her Symbicort last night and felt it worked better. Pt is requesting provider recommendations, routing to clinic for review.   E2C2 Pulmonary Triage - Initial Assessment Questions "Chief Complaint (e.g., cough, sob, wheezing, fever, chills, sweat or additional symptoms) *Go to specific symptom protocol after initial questions. Chest congestion  "How long have symptoms been present?" Reports wheeze has not resolved  Have you tested for COVID or Flu? Note: If not, ask patient if a home test can be taken. If so, instruct patient to call back for positive results. No  MEDICINES:   "Have you used any OTC meds to help with symptoms?" No If yes, ask "What medications?" NA  "Have you used your inhalers/maintenance medication?" Yes If yes, "What medications?" Breztri, tried Symbicort last night and felt it helped "open her up better"  If inhaler, ask "How many puffs and how often?" Note: Review instructions on medication in the chart. As prescribed  OXYGEN: "Do you wear supplemental oxygen?" No If yes, "How many liters are you supposed to use?" NA  "Do you monitor your oxygen levels?" Yes If yes, "What is your reading (oxygen level) today?" Did not check today, 95% last evening  "What is your usual oxygen saturation reading?"  (Note: Pulmonary O2 sats should be 90% or greater) 93% at baseline   Copied from CRM #[id number]. Topic: Clinical - Prescription Issue >> Apr 27, 2023  1:34 PM Brennan Bailey S wrote: Reason for CRM: patient is calling because the new inhaler Markus Daft is causing chest congestion and she went back to using Symbicort inhaler and this one helped her better. Reason for Disposition  [1] Caller has NON-URGENT medicine question about med that PCP prescribed AND [2] triager unable to answer question  Answer Assessment - Initial Assessment  Questions 1. NAME of MEDICINE: "What medicine(s) are you calling about?"     Breztri 2. QUESTION: "What is your question?" (e.g., double dose of medicine, side effect)     Pt reports increased chest congestion 3. PRESCRIBER: "Who prescribed the medicine?" Reason: if prescribed by specialist, call should be referred to that group.     Dr. Sherene Sires 4. SYMPTOMS: "Do you have any symptoms?" If Yes, ask: "What symptoms are you having?"  "How bad are the symptoms (e.g., mild, moderate, severe)     Varies, worse in the AM  Protocols used: Medication Question Call-A-AH

## 2023-04-27 NOTE — Addendum Note (Signed)
 Addended by: Shelby Dubin on: 04/27/2023 04:31 PM   Modules accepted: Orders

## 2023-04-27 NOTE — Telephone Encounter (Signed)
 Multiple attempts to send protonix in electronically.

## 2023-04-27 NOTE — Addendum Note (Signed)
 Addended by: Judd Gaudier on: 04/27/2023 04:36 PM   Modules accepted: Orders

## 2023-04-28 ENCOUNTER — Other Ambulatory Visit: Payer: Self-pay | Admitting: Internal Medicine

## 2023-04-28 MED ORDER — PANTOPRAZOLE SODIUM 40 MG PO TBEC: 40.0000 mg | DELAYED_RELEASE_TABLET | Freq: Every day | ORAL | 2 refills | Status: DC

## 2023-05-12 DIAGNOSIS — R300 Dysuria: Secondary | ICD-10-CM | POA: Diagnosis not present

## 2023-05-12 DIAGNOSIS — Z0000 Encounter for general adult medical examination without abnormal findings: Secondary | ICD-10-CM | POA: Diagnosis not present

## 2023-05-12 DIAGNOSIS — Z79899 Other long term (current) drug therapy: Secondary | ICD-10-CM | POA: Diagnosis not present

## 2023-05-12 DIAGNOSIS — E7800 Pure hypercholesterolemia, unspecified: Secondary | ICD-10-CM | POA: Diagnosis not present

## 2023-05-12 DIAGNOSIS — Z299 Encounter for prophylactic measures, unspecified: Secondary | ICD-10-CM | POA: Diagnosis not present

## 2023-05-12 DIAGNOSIS — I10 Essential (primary) hypertension: Secondary | ICD-10-CM | POA: Diagnosis not present

## 2023-05-12 DIAGNOSIS — C3490 Malignant neoplasm of unspecified part of unspecified bronchus or lung: Secondary | ICD-10-CM | POA: Diagnosis not present

## 2023-05-12 DIAGNOSIS — N390 Urinary tract infection, site not specified: Secondary | ICD-10-CM | POA: Diagnosis not present

## 2023-05-12 DIAGNOSIS — R5383 Other fatigue: Secondary | ICD-10-CM | POA: Diagnosis not present

## 2023-05-17 NOTE — Progress Notes (Deleted)
 Zoe [name], female    DOB: [birth year]    MRN: [id number]   Brief patient profile:  3 yowf  quit smoking Aug 2022 due to cough/wheezing but very little need for albuterol   referred to pulmonary clinic in [facility]  06/17/2022 by Dr Darien Eden  for copd eval.   History of Present Illness  06/17/2022  Pulmonary/ 1st office eval/ Hameed Kolar / [facility] on Advair or Trelegy and can't tell much difference between the two Chief Complaint  Patient presents with   Pulmonary Consult    Referred by Dr. Alvia Awkward for eval of COPD. Pt c/o cough for years. She has been coughing up large amounts of yellow to green sputum over the past 2-3 months.   Dyspnea:  pushes cart at [employer] /uses [facility] due to back / not breathing  Cough: yellowish / green off and on for months  Sleep: bed is flat  2 choking spells "though I was going to die"  in last few months prior to OV  resolved s heimlich "felt like spit was stuck" - takes otc ppi q d pc  SABA use: hfa every few days  02: none  Rec Stop advair/ trelegy (powder inhalers)  Plan A = Automatic = Always=    symbicort  80 Take 2 puffs first thing in am and then another 2 puffs about 12 hours later.  Work on inhaler technique: Plan B = Backup (to supplement plan A, not to replace it) Only use your albuterol  inhaler as a rescue medication Pantoprazole  (protonix ) 40 mg   Take  30-60 min before first meal of the day and Pepcid  (famotidine )  20 mg after supper until return to office  GERD diet reviewed, bed blocks rec  Please schedule a follow up office visit in 6 weeks, call sooner if needed with all respiratory medications /inhalers/ solutions in hand    Late add RUL nodule rx omnicef  x 10 d then ct at 2 weeks   07/29/2022  f/u ov/[place] office/Zoe [name] re: COPD gold ? / AB  maint on symbicort      f/u spn / did not bring meds   ? On 160 symb vs 80 Chief Complaint  Patient presents with   Follow-up  Dyspnea:  about the same  Cough: non productive  cough - says   trelegy helped cough up mucus better  Sleeping: bed is flat, no more choking spells  SABA use: rarely  02: none  Rec Plan A = Automatic = Always=  Try symbicort  80 Take 2 puffs first thing in am and then another 2 puffs about 12 hours later and take gabapentin  for breakfast, supper and bedtime  If not better after a month then ok to change to Trelegy 100 one click each am  Plan B for breathing = Backup (to supplement plan A, not to replace it) Only use your albuterol  inhaler as a rescue medication Plan C = Crisis (instead of Plan B but only if Plan B stops working) - only use your albuterol  nebulizer if you first try Plan B  For cough/ congestion >   mucinex dm  up to maximum of  1200 mg every 12 hours as needed Continue Pantoprazole  (protonix ) 40 mg   Take  30-60 min before first meal of the day and Pepcid  (famotidine )  20 mg after supper until return to office - this is the best way to tell whether stomach acid is contributing to your problem.  Depomedrol 120 mg IM   Jan  10th  2025 last RT Right side    02/23/2023  ACUTE  ov/[place] office/Zoe [name] re: cough  maint on Trelegy   Chief Complaint  Patient presents with   Acute Visit  Dyspnea:  breathing worse x  weeks Cough: dry hack since April 2024, better since stopped trelegy last 2 d / cough to point of gagging Sleeping: bed is flat and uses wedge otherwise cough/ sob keep her up   SABA use: none  02: none  Rec Try prilosec (omeprazole )   40mg   Take 30-60 min before first meal of the day and Pepcid  ac (famotidine ) 20 mg one  after supper until return  Increase the gabapentin  to three times daily for a week then if still coughing then up to 4 x daily    Stop trelegy  Symbicort  80 Take 2 puffs first thing in am and then another 2 puffs about 12 hours later.  Take delsym two tsp every 12 hours and supplement if needed with tramadol  50 mg up to 1-2 every 4 hours to suppress the urge to cough. Swallowing water  and/or using ice chips/non  mint and menthol  containing candies (such as lifesavers or sugarless jolly ranchers) are also effective.  You should rest your voice and avoid activities that you know make you cough. Once you have eliminated the cough for 3 straight days try reducing the tramadol  first,  then the delsym as tolerated.   Prednisone  10 mg take  4 each am x 2 days,   2 each am x 2 days,  1 each am x 2 days and stop  Only use your albuterol  as a rescue medication t     04/06/2023  f/u ov/[place] office/Zoe [name] re: cough since May 2024  maint on symbicort  t 80 2bid and ? Gabapentin  300 mg bid  did not  bring  meds x for breztri   Chief Complaint  Patient presents with   Follow-up    Follow up 3 week f   Dyspnea:  grocery shopping pushing the cart /balance more than breathing Cough: better than it was on trelegy but still present / not productive  Sleeping:  flat bed 3 pillows every night coughing per husband  SABA use: 2 x daily  02: none  Rec Try breztri  Take 2 puffs first thing in am and then another 2 puffs about 12 hours later.  Only use your albuterol  as a rescue medication Increased gabapentin  to  4 x daily to see if this dose helps the cough  Change delsym to mucinex dm 1200 mg every 12 hours as needed  Please schedule a follow up office visit in 6 weeks, call sooner if needed        05/19/2023  f/u ov/[place] office/Zoe [name] re: *** maint on ***  No chief complaint on file.   Dyspnea:  *** Cough: *** Sleeping: ***   resp cc  SABA use: *** 02: ***  Lung cancer screening: ***   No obvious day to day or daytime variability or assoc excess/ purulent sputum or mucus plugs or hemoptysis or cp or chest tightness, subjective wheeze or overt sinus or hb symptoms.    Also denies any obvious fluctuation of symptoms with weather or environmental changes or other aggravating or alleviating factors except as outlined above   No unusual exposure hx or h/o childhood pna/ asthma or knowledge of premature  birth.  Current Allergies, Complete Past Medical History, Past Surgical History, Family History, and Social History were reviewed in [employer]  medical record.  ROS  The following are not active complaints unless bolded Hoarseness, sore throat, dysphagia, dental problems, itching, sneezing,  nasal congestion or discharge of excess mucus or purulent secretions, ear ache,   fever, chills, sweats, unintended wt loss or wt gain, classically pleuritic or exertional cp,  orthopnea pnd or arm/hand swelling  or leg swelling, presyncope, palpitations, abdominal pain, anorexia, nausea, vomiting, diarrhea  or change in bowel habits or change in bladder habits, change in stools or change in urine, dysuria, hematuria,  rash, arthralgias, visual complaints, headache, numbness, weakness or ataxia or problems with walking or coordination,  change in mood or  memory.        No outpatient medications have been marked as taking for the 05/19/23 encounter (Appointment) with Diamond Formica, MD.              Past Medical History:  Diagnosis Date   Abdominal discomfort 11/02/2012   suspected ?UTI- PCP MD placed on Cipro    Anxiety    Arthritis    Asthma    Cancer (HCC)    skin CA   Chronic constipation    Depression    Dysrhythmia    tachycardia   Environmental allergies    Fibromyalgia    GERD (gastroesophageal reflux disease)    Hemorrhoids    Hypercholesteremia    Hypertension    Sleep apnea       Objective:    Wts  05/19/2023        ***  04/06/2023        161       02/23/2023        168    07/29/22 172 lb (78 kg)  06/17/22 171 lb (77.6 kg)  06/11/22 170 lb (77.1 kg)    Vital signs reviewed  05/19/2023  - Note at rest 02 sats  ***% on ***   General appearance:    ***    Min barr***       Assessment

## 2023-05-18 NOTE — Progress Notes (Signed)
 [facility] - Clinic Note  05/20/2023   CHIEF COMPLAINT Patient presents for Retina Follow Up  HISTORY OF PRESENT ILLNESS: Zoe [name] is a 88 y.o. female who presents to the clinic today for:  HPI     Retina Follow Up   Patient presents with  Wet AMD.  In right eye.  Severity is moderate.  Duration of 4 weeks.  Since onset it is stable.  I, the attending physician,  performed the HPI with the patient and updated documentation appropriately.        Comments   Pt here for 4 wk ret f/u exu ARMD OD. Pt states [facility] seems to try to be improving. Not seeing the big black spot as often but its off and on. Pt reports to be on 3 eye drops prescribed by ophthalmologist, travoprost and latanoprost  ? Unsure of all names.       Last edited by Ronelle Coffee, MD on 05/20/2023  2:26 PM.    Pt states the black circle in her eye is gone and her vision has gone from black to "smudgy", she feels like it's getting better   Referring physician: Florance Hun, MD [address] [place],  [place] [address]  HISTORICAL INFORMATION:  Selected notes from the MEDICAL RECORD NUMBER Referred by Dr. Carloyn Chi for concern of exu ARMD OD LEE:  Ocular Hx- PMH-   CURRENT MEDICATIONS: Current Outpatient Medications (Ophthalmic Drugs)  Medication Sig   dorzolamide  (TRUSOPT ) 2 % ophthalmic solution Place 1 drop into the right eye daily.   Travoprost, BAK Free, (TRAVATAN) 0.004 % SOLN ophthalmic solution Place 1 drop into both eyes at bedtime.   No current facility-administered medications for this visit. (Ophthalmic Drugs)   Current Outpatient Medications (Other)  Medication Sig   albuterol  (PROVENTIL  HFA;VENTOLIN  HFA) 108 (90 BASE) MCG/ACT inhaler Inhale 2 puffs into the lungs every 6 (six) hours as needed for wheezing or shortness of breath.   allopurinol  (ZYLOPRIM ) 300 MG tablet Take 1 tablet (300 mg total) by mouth daily.   ALPRAZolam  (XANAX ) 0.5 MG tablet Take 0.5 mg by mouth 2  (two) times daily as needed for anxiety.   apixaban  (ELIQUIS ) 5 MG TABS tablet Take 1 tablet (5 mg total) by mouth 2 (two) times daily.   APPLE CIDER VINEGAR PO Take 625 mg by mouth daily.   Biotin  5000 MCG TABS Take 5,000-10,000 mcg by mouth daily.   budesonide -formoterol  (SYMBICORT ) 80-4.5 MCG/ACT inhaler Inhale 2 puffs into the lungs 2 (two) times daily.   calcium-vitamin D  250-100 MG-UNIT tablet Take 1 tablet by mouth daily.   Cholecalciferol (VITAMIN D -3) 125 MCG (5000 UT) TABS Take 5,000 Units by mouth daily.   Cranberry-Vitamin C (CRANBERRY CONCENTRATE/VITAMINC PO) Take 250 mg by mouth daily.   cyanocobalamin  2000 MCG tablet Take 5,000 mcg by mouth daily.   diclofenac  Sodium (VOLTAREN ) 1 % GEL Apply 2-4 g topically 4 (four) times daily. APPLY 2-4 GRAMS TOPICALLY FOUR TIMES DAILY Strength: 1 %   doxepin  (SINEQUAN ) 100 MG capsule Take 1 capsule (100 mg total) by mouth at bedtime.   famotidine  (PEPCID ) 20 MG tablet One after supper   gabapentin  (NEURONTIN ) 300 MG capsule Take 300 mg by mouth 4 (four) times daily as needed (pain).   ibuprofen  (ADVIL ) 800 MG tablet Take 800 mg by mouth every 6 (six) hours as needed.   lactulose  (CHRONULAC ) 10 GM/15ML solution 15 ml daily   lubiprostone  (AMITIZA ) 24 MCG capsule Take 1 capsule (24 mcg  total) by mouth 2 (two) times daily with a meal.   magnesium  oxide (MAG-OX) 400 (240 Mg) MG tablet Take 400 mg by mouth every Monday, Wednesday, and Friday.   meloxicam  (MOBIC ) 15 MG tablet Take 15 mg by mouth daily.   metoprolol  tartrate (LOPRESSOR ) 25 MG tablet Take 0.5 tablets (12.5 mg total) by mouth daily as needed. If BP and HR is greater than 100 and for palpitations   pantoprazole  (PROTONIX ) 40 MG tablet Take 1 tablet (40 mg total) by mouth daily.   Potassium 99 MG TABS Take 99 mg by mouth every Monday, Wednesday, and Friday.   senna (SENOKOT) 8.6 MG tablet Take 5 tablets by mouth 2 (two) times daily.   No current facility-administered medications for  this visit. (Other)   Facility-Administered Medications Ordered in Other Visits (Other)  Medication Route   regadenoson  (LEXISCAN ) injection SOLN 0.4 mg Intravenous   technetium tetrofosmin  (TC-MYOVIEW ) injection 31.4 millicurie Intravenous   REVIEW OF SYSTEMS: ROS   Positive for: Eyes, Respiratory, Allergic/Imm Negative for: Constitutional, Gastrointestinal, Neurological, Skin, Genitourinary, Musculoskeletal, HENT, Endocrine, Cardiovascular, Psychiatric, Heme/Lymph Last edited by Anthony Bateman, COT on 05/20/2023  1:30 PM.        ALLERGIES Allergies  Allergen Reactions   Codeine Other (See Comments)    Pains in abdominal area   Other     NOVACAINE     ITCHING Anti-depressants   Oxycodone      Abdominal pain, chest pain   Paxil [Paroxetine Hcl] Itching   Procaine Itching   PAST MEDICAL HISTORY Past Medical History:  Diagnosis Date   A-fib [facility])    pt went into A-fib during lung biopsies   Abdominal discomfort 11/02/2012   suspected ?UTI- PCP MD placed on Cipro    Anxiety    Arthritis    Asthma    Cancer (HCC)    skin CA   Choking due to phlegm    Chronic constipation    Depression    Dysrhythmia    tachycardia   Environmental allergies    Fibromyalgia    GERD (gastroesophageal reflux disease)    Hemorrhoids    Hiatal hernia    Hypercholesteremia    Hypertension    Sleep apnea    Past Surgical History:  Procedure Laterality Date   ANAL RECTAL MANOMETRY N/A 11/28/2013   Procedure: ANO RECTAL MANOMETRY;  Surgeon: Joyce Nixon, MD;  Location: [facility];  Service: Endoscopy;  Laterality: N/A;   APPENDECTOMY     BACK SURGERY     X2    BIOPSY  03/13/2021   Procedure: BIOPSY;  Surgeon: Ruby Corporal, MD;  Location: [facility];  Service: Endoscopy;;   BRONCHIAL BIOPSY  09/02/2022   Procedure: BRONCHIAL BIOPSIES;  Surgeon: Prudy Brownie, DO;  Location: [facility];  Service: Pulmonary;;   BRONCHIAL BRUSHINGS  09/02/2022   Procedure: BRONCHIAL  BRUSHINGS;  Surgeon: Prudy Brownie, DO;  Location: [facility];  Service: Pulmonary;;   BRONCHIAL NEEDLE ASPIRATION BIOPSY  09/02/2022   Procedure: BRONCHIAL NEEDLE ASPIRATION BIOPSIES;  Surgeon: Prudy Brownie, DO;  Location: [facility];  Service: Pulmonary;;   CHOLECYSTECTOMY     COLONOSCOPY  03/29/03   COLONOSCOPY N/A 12/09/2013   Procedure: COLONOSCOPY;  Surgeon: Ruby Corporal, MD;  [facility]: [facility];  Service: Endoscopy;  Laterality: N/A;  730   COLONOSCOPY N/A 02/04/2017   Procedure: COLONOSCOPY;  Surgeon: Ruby Corporal, MD;  [facility];  Service: Endoscopy;  Laterality: N/A;  930  ENDOBRONCHIAL ULTRASOUND Bilateral 09/02/2022   Procedure: ENDOBRONCHIAL ULTRASOUND;  Surgeon: Prudy Brownie, DO;  [facility]: [facility];  Service: Pulmonary;  Laterality: Bilateral;   ESOPHAGEAL DILATION N/A 03/13/2021   Procedure: ESOPHAGEAL DILATION;  Surgeon: Ruby Corporal, MD;  [facility];  Service: Endoscopy;  Laterality: N/A;   ESOPHAGOGASTRODUODENOSCOPY (EGD) WITH PROPOFOL  N/A 03/13/2021   Procedure: ESOPHAGOGASTRODUODENOSCOPY (EGD) WITH PROPOFOL ;  Surgeon: Ruby Corporal, MD;  [facility];  Service: Endoscopy;  Laterality: N/A;  8:05   EYE SURGERY     bilateral cataract removal   INTERCOSTAL NERVE BLOCK Right 11/17/2022   Procedure: INTERCOSTAL NERVE BLOCK;  Surgeon: Hilarie Lovely, MD;  Location: [facility];  Service: Thoracic;  Laterality: Right;   IR KYPHO LUMBAR INC FX REDUCE BONE BX UNI/BIL CANNULATION INC/IMAGING  11/24/2019   IR KYPHO THORACIC WITH BONE BIOPSY  08/23/2019   IR KYPHO THORACIC WITH BONE BIOPSY  11/24/2019   IR RADIOLOGIST EVAL & MGMT  08/03/2019   IR RADIOLOGIST EVAL & MGMT  09/29/2019   IR RADIOLOGIST EVAL & MGMT  10/18/2019   IR RADIOLOGIST EVAL & MGMT  04/11/2020   NODE DISSECTION Right 11/17/2022   Procedure: NODE DISSECTION;  Surgeon: Hilarie Lovely, MD;  Location: [facility];  Service: Thoracic;  Laterality: Right;    SKIN CANCER EXCISION     RIGHT NECK     TONSILLECTOMY     T+A   TOTAL HIP ARTHROPLASTY Right 09/14/2012   Procedure: RIGHT TOTAL HIP ARTHROPLASTY ANTERIOR APPROACH and RIGHT KNEE STEROID INJECTION;  Surgeon: Arnie Lao, MD;  Location: [facility];  Service: Orthopedics;  Laterality: Right;   TOTAL HIP ARTHROPLASTY Left 11/05/2012   Procedure: LEFT TOTAL HIP ARTHROPLASTY ANTERIOR APPROACH;  Surgeon: Arnie Lao, MD;  Location: [facility];  Service: Orthopedics;  Laterality: Left;   UMBILICAL HERNIA REPAIR  04/26/03   JENKINS   UPPER GASTROINTESTINAL ENDOSCOPY  08/03/2009   NUR   UPPER GASTROINTESTINAL ENDOSCOPY  03/29/2003   EGD ED TCS   FAMILY HISTORY Family History  Problem Relation Age of Onset   Healthy Son    Healthy Son    Healthy Daughter    Colon cancer Neg Hx    SOCIAL HISTORY Social History   Tobacco Use   Smoking status: Former    Current packs/day: 0.00    Average packs/day: 0.5 packs/day for 50.0 years (25.0 ttl pk-yrs)    Types: Cigarettes    Start date: 09/07/1970    Quit date: 09/06/2020    Years since quitting: 2.7   Smokeless tobacco: Never  Vaping Use   Vaping status: Never Used  Substance Use Topics   Alcohol  use: Yes    Comment: 1-2 drinks per week   Drug use: No       OPHTHALMIC EXAM:  Base Eye Exam     Visual Acuity (Snellen - Linear)       Right Left   Dist [place] 20/200 -1 20/30 -2   Dist ph Herrick NI 20/20 -1  Pt did not bring rx specs today MS        Tonometry (Tonopen, 1:39 PM)       Right Left   Pressure 13 13         Pupils       Pupils Dark Light Shape React APD   Right PERRL 4 3 Round Brisk None   Left PERRL 3 2 Round Brisk None  Visual Fields (Counting fingers)       Left Right    Full Full         Extraocular Movement       Right Left    Full, Ortho Full, Ortho         Neuro/Psych     Oriented x3: Yes   Mood/Affect: Normal         Dilation     Both eyes: 1.0% Mydriacyl,  2.5% Phenylephrine  @ 1:40 PM           Slit Lamp and Fundus Exam     Slit Lamp Exam       Right Left   Lids/Lashes Dermatochalasis - upper lid Dermatochalasis - upper lid   Conjunctiva/Sclera White and quiet White and quiet   Cornea well healed cataract wound, trace PEE, mild tear film debris mild arcus, mild tear film debris, well healed cataract wound   Anterior Chamber deep and clear deep and clear   Iris Round and dilated Round and dilated   Lens 3 piece posterior chamber intraocular lens with mild temporal displacement 3 piece posterior chamber intraocular lens in good position   Anterior Vitreous mild syneresis mild syneresis         Fundus Exam       Right Left   Disc mild Pallor, Sharp rim, temporal PPA mild Pallor, Sharp rim, temporal PPA   C/D Ratio 0.3 0.3   Macula Blunted foveal reflex, central CNV with +heme and edema -- heme slightly improved, Drusen, RPE mottling, RPE rip with atrophy Flat, Blunted foveal reflex, Drusen, RPE mottling and clumping, No heme or edema   Vessels attenuated, Tortuous attenuated, mild tortuosity   Periphery Attached, pigmented paving stone degeneration inferiorly, No heme Attached, reticular degeneration, No heme           IMAGING AND PROCEDURES  Imaging and Procedures for 05/20/2023  OCT, Retina - OU - Both Eyes       Right Eye Quality was good. Central Foveal Thickness: 539. Progression has been stable. Findings include no IRF, abnormal foveal contour, retinal drusen , subretinal hyper-reflective material, choroidal neovascular membrane, pigment epithelial detachment, subretinal fluid (central CNV with surrounding SRF / SRHM -- minimal improvement; RPE rip).   Left Eye Quality was good. Central Foveal Thickness: 230. Progression has been stable. Findings include normal foveal contour, no IRF, no SRF, retinal drusen , pigment epithelial detachment (Mild patchy ORA).   Notes *Images captured and stored on drive  Diagnosis /  Impression:  OD: central CNV with surrounding SRF / SRHM -- minimal improvement; RPE rip OS: nonexudative ARMD w/ patchy ORA  Clinical management:  See below  Abbreviations: NFP - Normal foveal profile. CME - cystoid macular edema. PED - pigment epithelial detachment. IRF - intraretinal fluid. SRF - subretinal fluid. EZ - ellipsoid zone. ERM - epiretinal membrane. ORA - outer retinal atrophy. ORT - outer retinal tubulation. SRHM - subretinal hyper-reflective material. IRHM - intraretinal hyper-reflective material      Intravitreal Injection, Pharmacologic Agent - OD - Right Eye       Time Out 05/20/2023. 2:01 PM. Confirmed correct patient, procedure, site, and patient consented.   Anesthesia Topical anesthesia was used. Anesthetic medications included Lidocaine  2%, Proparacaine 0.5%.   Procedure Preparation included 5% betadine to ocular surface, eyelid speculum. A (32g) needle was used.   Injection: 6 mg faricimab -svoa 6 MG/0.05ML (Patient supplied)   Route: Intravitreal, Site: Right Eye   NDC: 16109-604-54, Lot: [id number], Expiration date:  09/26/2024, Waste: 0 mL   Post-op Post injection exam found visual acuity of at least counting fingers. The patient tolerated the procedure well. There were no complications. The patient received written and verbal post procedure care education.           ASSESSMENT/PLAN:   ICD-10-CM   1. Exudative age-related macular degeneration of right eye with active choroidal neovascularization (HCC)  H35.3211 OCT, Retina - OU - Both Eyes    Intravitreal Injection, Pharmacologic Agent - OD - Right Eye    faricimab -svoa (VABYSMO ) 6mg /0.9mL intravitreal injection    2. 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 Essential hypertension  I10     4. Hypertensive retinopathy of both eyes  H35.033     5. Pseudophakia, both eyes  Z96.1      1. 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, OD  - s/p  IVA OD #1 (01.28.25), #2 (01.28.25), #3 (03.25.25)  - exam shows central CNV w/ +heme -- improving slowly  - BCVA OD stable at 20/200  - OCT shows central CNV with surrounding SRF / SRHM -- minimal improvement; RPE rip RPE rip **discussed decreased efficacy / resistance to Avastin  and potential benefit of switching from Avastin  to Vabysmo ** - recommend switching to IVV OD sample #1 today, 04.21.25 w/ f/u in 4 wks - pt wishes to be treated with IVV - RBA of procedure discussed, questions answered - IVA informed consent obtained and signed, 01.28.25 - IVV informed consent obtained and signed, 04.23.25 - see procedure note  - f/u in 4 wks -- DFE/OCT, possible injection   2. Age related macular degeneration, non-exudative, OS  - The incidence, anatomy, and pathology of dry AMD, risk of progression, and the AREDS and AREDS 2 study including smoking risks discussed with patient.  - Recommend amsler grid monitoring  - monitor  3,4. Hypertensive retinopathy OU - discussed importance of tight BP control - monitor  5. Pseudophakia OU  - s/p CE/IOL (Dr. Joyice Nodal)  - IOL in good position, doing well  - monitor  Ophthalmic Meds Ordered this visit:  Meds ordered this encounter  Medications   faricimab -svoa (VABYSMO ) 6mg /0.50mL intravitreal injection     Return in about 4 weeks (around 06/17/2023) for f/u exu ARMD OD, DFE, OCT, Possible Injxn.  There are no Patient Instructions on file for this visit.  Explained the diagnoses, plan, and follow up with the patient and they expressed understanding.  Patient expressed understanding of the importance of proper follow up care.   This document serves as a record of services personally performed by Jeanice Millard, MD, PhD. It was created on their behalf by Morley Arabia. Bevin Bucks, OA an ophthalmic technician. The creation of this record is the provider's dictation and/or activities during the visit.    Electronically signed by: Morley Arabia. Bevin Bucks, OA 05/20/23  2:27 PM  Jeanice Millard, M.D., Ph.D. Diseases & Surgery of the Retina and Vitreous [facility] 05/20/2023  I have reviewed the above documentation for accuracy and completeness, and I agree with the above. Jeanice Millard, M.D., Ph.D. 05/20/23 2:27 PM   Abbreviations: M myopia (nearsighted); A astigmatism; H hyperopia (farsighted); P presbyopia; Mrx spectacle prescription;  CTL contact lenses; OD right eye; OS left eye; OU both eyes  XT exotropia; ET esotropia; PEK punctate epithelial keratitis; PEE punctate epithelial erosions; DES dry eye syndrome; MGD meibomian gland dysfunction; ATs artificial tears; PFAT's preservative free artificial tears; NSC  nuclear sclerotic cataract; PSC posterior subcapsular cataract; ERM epi-retinal membrane; PVD posterior vitreous detachment; RD retinal detachment; DM diabetes mellitus; DR diabetic retinopathy; NPDR non-proliferative diabetic retinopathy; PDR proliferative diabetic retinopathy; CSME clinically significant macular edema; DME diabetic macular edema; dbh dot blot hemorrhages; CWS cotton wool spot; POAG primary open angle glaucoma; C/D cup-to-disc ratio; HVF humphrey visual field; GVF goldmann visual field; OCT optical coherence tomography; IOP intraocular pressure; BRVO Branch retinal vein occlusion; CRVO central retinal vein occlusion; CRAO central retinal artery occlusion; BRAO branch retinal artery occlusion; RT retinal tear; SB scleral buckle; PPV pars plana vitrectomy; VH Vitreous hemorrhage; PRP panretinal laser photocoagulation; IVK intravitreal kenalog ; VMT vitreomacular traction; MH Macular hole;  NVD neovascularization of the disc; NVE neovascularization elsewhere; AREDS age related eye disease study; ARMD age related macular degeneration; POAG primary open angle glaucoma; EBMD epithelial/anterior basement membrane dystrophy; ACIOL anterior chamber intraocular lens; IOL intraocular lens; PCIOL posterior chamber intraocular lens;  Phaco/IOL phacoemulsification with intraocular lens placement; PRK photorefractive keratectomy; LASIK laser assisted in situ keratomileusis; HTN hypertension; DM diabetes mellitus; COPD chronic obstructive pulmonary disease

## 2023-05-19 ENCOUNTER — Other Ambulatory Visit: Payer: Self-pay

## 2023-05-19 ENCOUNTER — Ambulatory Visit: Admitting: Internal Medicine

## 2023-05-20 ENCOUNTER — Encounter (INDEPENDENT_AMBULATORY_CARE_PROVIDER_SITE_OTHER): Payer: Self-pay | Admitting: Ophthalmology

## 2023-05-20 ENCOUNTER — Ambulatory Visit (INDEPENDENT_AMBULATORY_CARE_PROVIDER_SITE_OTHER): Admitting: Ophthalmology

## 2023-05-20 DIAGNOSIS — Z961 Presence of intraocular lens: Secondary | ICD-10-CM | POA: Diagnosis not present

## 2023-05-20 DIAGNOSIS — I10 Essential (primary) hypertension: Secondary | ICD-10-CM

## 2023-05-20 DIAGNOSIS — H353211 Exudative age-related macular degeneration, right eye, with active choroidal neovascularization: Secondary | ICD-10-CM | POA: Diagnosis not present

## 2023-05-20 DIAGNOSIS — H35033 Hypertensive retinopathy, bilateral: Secondary | ICD-10-CM | POA: Diagnosis not present

## 2023-05-20 DIAGNOSIS — H353122 Nonexudative age-related macular degeneration, left eye, intermediate dry stage: Secondary | ICD-10-CM | POA: Diagnosis not present

## 2023-05-20 MED ORDER — FARICIMAB-SVOA 6 MG/0.05ML IZ SOLN
6.0000 mg | INTRAVITREAL | Status: AC | PRN
  Administered 2023-05-20: 6 mg via INTRAVITREAL

## 2023-06-02 DIAGNOSIS — M25561 Pain in right knee: Secondary | ICD-10-CM | POA: Diagnosis not present

## 2023-06-02 DIAGNOSIS — C3411 Malignant neoplasm of upper lobe, right bronchus or lung: Secondary | ICD-10-CM | POA: Diagnosis not present

## 2023-06-02 DIAGNOSIS — C771 Secondary and unspecified malignant neoplasm of intrathoracic lymph nodes: Secondary | ICD-10-CM | POA: Diagnosis not present

## 2023-06-02 DIAGNOSIS — I10 Essential (primary) hypertension: Secondary | ICD-10-CM | POA: Diagnosis not present

## 2023-06-02 DIAGNOSIS — C3490 Malignant neoplasm of unspecified part of unspecified bronchus or lung: Secondary | ICD-10-CM | POA: Diagnosis not present

## 2023-06-02 DIAGNOSIS — Z299 Encounter for prophylactic measures, unspecified: Secondary | ICD-10-CM | POA: Diagnosis not present

## 2023-06-05 ENCOUNTER — Other Ambulatory Visit: Payer: Self-pay

## 2023-06-08 NOTE — Progress Notes (Signed)
 [facility] - Clinic Note  06/17/2023   CHIEF COMPLAINT Patient presents for Retina Follow Up  HISTORY OF PRESENT ILLNESS: Zoe [name] is a 88 y.o. female who presents to the clinic today for:  HPI     Retina Follow Up   Patient presents with  Wet AMD.  In right eye.  Severity is moderate.  Duration of 4 weeks.  Since onset it is stable.  I, the attending physician,  performed the HPI with the patient and updated documentation appropriately.        Comments   Pt here for 4 wk ret f/u exu ARMD OD. Pt states she feels VA is a bit better. "Not bumping into the wall as much".       Last edited by Ronelle Coffee, MD on 06/17/2023 10:35 PM.       Referring physician: Florance Hun, MD [address] [place],  [place] [address]  HISTORICAL INFORMATION:  Selected notes from the MEDICAL RECORD NUMBER Referred by Dr. Carloyn Chi for concern of exu ARMD OD LEE:  Ocular Hx- PMH-   CURRENT MEDICATIONS: Current Outpatient Medications (Ophthalmic Drugs)  Medication Sig   dorzolamide  (TRUSOPT ) 2 % ophthalmic solution Place 1 drop into the right eye daily.   Travoprost, BAK Free, (TRAVATAN) 0.004 % SOLN ophthalmic solution Place 1 drop into both eyes at bedtime.   No current facility-administered medications for this visit. (Ophthalmic Drugs)   Current Outpatient Medications (Other)  Medication Sig   albuterol  (PROVENTIL  HFA;VENTOLIN  HFA) 108 (90 BASE) MCG/ACT inhaler Inhale 2 puffs into the lungs every 6 (six) hours as needed for wheezing or shortness of breath.   allopurinol  (ZYLOPRIM ) 300 MG tablet Take 1 tablet (300 mg total) by mouth daily.   ALPRAZolam  (XANAX ) 0.5 MG tablet Take 0.5 mg by mouth 2 (two) times daily as needed for anxiety.   apixaban  (ELIQUIS ) 5 MG TABS tablet Take 1 tablet (5 mg total) by mouth 2 (two) times daily.   APPLE CIDER VINEGAR PO Take 625 mg by mouth daily.   Biotin  5000 MCG TABS Take 5,000-10,000 mcg by mouth daily.    budesonide -formoterol  (SYMBICORT ) 80-4.5 MCG/ACT inhaler Inhale 2 puffs into the lungs 2 (two) times daily.   calcium-vitamin D  250-100 MG-UNIT tablet Take 1 tablet by mouth daily.   Cholecalciferol (VITAMIN D -3) 125 MCG (5000 UT) TABS Take 5,000 Units by mouth daily.   Cranberry-Vitamin C (CRANBERRY CONCENTRATE/VITAMINC PO) Take 250 mg by mouth daily.   cyanocobalamin  2000 MCG tablet Take 5,000 mcg by mouth daily.   diclofenac  Sodium (VOLTAREN ) 1 % GEL Apply 2-4 g topically 4 (four) times daily. APPLY 2-4 GRAMS TOPICALLY FOUR TIMES DAILY Strength: 1 %   doxepin  (SINEQUAN ) 100 MG capsule Take 1 capsule (100 mg total) by mouth at bedtime.   famotidine  (PEPCID ) 20 MG tablet One after supper   gabapentin  (NEURONTIN ) 300 MG capsule Take 300 mg by mouth 4 (four) times daily as needed (pain).   ibuprofen  (ADVIL ) 800 MG tablet Take 800 mg by mouth every 6 (six) hours as needed.   lactulose  (CHRONULAC ) 10 GM/15ML solution 15 ml daily   lubiprostone  (AMITIZA ) 24 MCG capsule Take 1 capsule (24 mcg total) by mouth 2 (two) times daily with a meal.   magnesium  oxide (MAG-OX) 400 (240 Mg) MG tablet Take 400 mg by mouth every Monday, Wednesday, and Friday.   meloxicam  (MOBIC ) 15 MG tablet Take 15 mg by mouth daily.   metoprolol  tartrate (LOPRESSOR ) 25 MG  tablet Take 0.5 tablets (12.5 mg total) by mouth daily as needed. If BP and HR is greater than 100 and for palpitations   pantoprazole  (PROTONIX ) 40 MG tablet Take 1 tablet (40 mg total) by mouth daily.   Potassium 99 MG TABS Take 99 mg by mouth every Monday, Wednesday, and Friday.   senna (SENOKOT) 8.6 MG tablet Take 5 tablets by mouth 2 (two) times daily.   No current facility-administered medications for this visit. (Other)   Facility-Administered Medications Ordered in Other Visits (Other)  Medication Route   regadenoson  (LEXISCAN ) injection SOLN 0.4 mg Intravenous   technetium tetrofosmin  (TC-MYOVIEW ) injection 31.4 millicurie Intravenous   REVIEW  OF SYSTEMS: ROS   Positive for: Eyes, Respiratory, Allergic/Imm Negative for: Constitutional, Gastrointestinal, Neurological, Skin, Genitourinary, Musculoskeletal, HENT, Endocrine, Cardiovascular, Psychiatric, Heme/Lymph Last edited by Anthony Bateman, COT on 06/17/2023  1:14 PM.         ALLERGIES Allergies  Allergen Reactions   Codeine Other (See Comments)    Pains in abdominal area   Other     NOVACAINE     ITCHING Anti-depressants   Oxycodone      Abdominal pain, chest pain   Paxil [Paroxetine Hcl] Itching   Procaine Itching   PAST MEDICAL HISTORY Past Medical History:  Diagnosis Date   A-fib [facility])    pt went into A-fib during lung biopsies   Abdominal discomfort 11/02/2012   suspected ?UTI- PCP MD placed on Cipro    Anxiety    Arthritis    Asthma    Cancer (HCC)    skin CA   Choking due to phlegm    Chronic constipation    Depression    Dysrhythmia    tachycardia   Environmental allergies    Fibromyalgia    GERD (gastroesophageal reflux disease)    Hemorrhoids    Hiatal hernia    Hypercholesteremia    Hypertension    Sleep apnea    Past Surgical History:  Procedure Laterality Date   ANAL RECTAL MANOMETRY N/A 11/28/2013   Procedure: ANO RECTAL MANOMETRY;  Surgeon: Joyce Nixon, MD;  Location: [facility];  Service: Endoscopy;  Laterality: N/A;   APPENDECTOMY     BACK SURGERY     X2    BIOPSY  03/13/2021   Procedure: BIOPSY;  Surgeon: Ruby Corporal, MD;  Location: [facility];  Service: Endoscopy;;   BRONCHIAL BIOPSY  09/02/2022   Procedure: BRONCHIAL BIOPSIES;  Surgeon: Prudy Brownie, DO;  Location: [facility];  Service: Pulmonary;;   BRONCHIAL BRUSHINGS  09/02/2022   Procedure: BRONCHIAL BRUSHINGS;  Surgeon: Prudy Brownie, DO;  Location: [facility];  Service: Pulmonary;;   BRONCHIAL NEEDLE ASPIRATION BIOPSY  09/02/2022   Procedure: BRONCHIAL NEEDLE ASPIRATION BIOPSIES;  Surgeon: Prudy Brownie, DO;  Location: [facility];  Service:  Pulmonary;;   CHOLECYSTECTOMY     COLONOSCOPY  03/29/03   COLONOSCOPY N/A 12/09/2013   Procedure: COLONOSCOPY;  Surgeon: Ruby Corporal, MD;  [facility];  Service: Endoscopy;  Laterality: N/A;  730   COLONOSCOPY N/A 02/04/2017   Procedure: COLONOSCOPY;  Surgeon: Ruby Corporal, MD;  [facility];  Service: Endoscopy;  Laterality: N/A;  930   ENDOBRONCHIAL ULTRASOUND Bilateral 09/02/2022   Procedure: ENDOBRONCHIAL ULTRASOUND;  Surgeon: Prudy Brownie, DO;  [facility]: [facility];  Service: Pulmonary;  Laterality: Bilateral;   ESOPHAGEAL DILATION N/A 03/13/2021   Procedure: ESOPHAGEAL DILATION;  Surgeon: Ruby Corporal, MD;  [facility];  Service: Endoscopy;  Laterality: N/A;   ESOPHAGOGASTRODUODENOSCOPY (EGD) WITH PROPOFOL  N/A 03/13/2021   Procedure: ESOPHAGOGASTRODUODENOSCOPY (EGD) WITH PROPOFOL ;  Surgeon: Ruby Corporal, MD;  [facility];  Service: Endoscopy;  Laterality: N/A;  8:05   EYE SURGERY     bilateral cataract removal   INTERCOSTAL NERVE BLOCK Right 11/17/2022   Procedure: INTERCOSTAL NERVE BLOCK;  Surgeon: Hilarie Lovely, MD;  Location: [facility];  Service: Thoracic;  Laterality: Right;   IR KYPHO LUMBAR INC FX REDUCE BONE BX UNI/BIL CANNULATION INC/IMAGING  11/24/2019   IR KYPHO THORACIC WITH BONE BIOPSY  08/23/2019   IR KYPHO THORACIC WITH BONE BIOPSY  11/24/2019   IR RADIOLOGIST EVAL & MGMT  08/03/2019   IR RADIOLOGIST EVAL & MGMT  09/29/2019   IR RADIOLOGIST EVAL & MGMT  10/18/2019   IR RADIOLOGIST EVAL & MGMT  04/11/2020   NODE DISSECTION Right 11/17/2022   Procedure: NODE DISSECTION;  Surgeon: Hilarie Lovely, MD;  Location: [facility];  Service: Thoracic;  Laterality: Right;   SKIN CANCER EXCISION     RIGHT NECK     TONSILLECTOMY     T+A   TOTAL HIP ARTHROPLASTY Right 09/14/2012   Procedure: RIGHT TOTAL HIP ARTHROPLASTY ANTERIOR APPROACH and RIGHT KNEE STEROID INJECTION;  Surgeon: Arnie Lao, MD;  Location: [facility];  Service: Orthopedics;  Laterality: Right;   TOTAL HIP ARTHROPLASTY Left 11/05/2012   Procedure: LEFT TOTAL HIP ARTHROPLASTY ANTERIOR APPROACH;  Surgeon: Arnie Lao, MD;  Location: [facility];  Service: Orthopedics;  Laterality: Left;   UMBILICAL HERNIA REPAIR  04/26/03   JENKINS   UPPER GASTROINTESTINAL ENDOSCOPY  08/03/2009   NUR   UPPER GASTROINTESTINAL ENDOSCOPY  03/29/2003   EGD ED TCS   FAMILY HISTORY Family History  Problem Relation Age of Onset   Healthy Son    Healthy Son    Healthy Daughter    Colon cancer Neg Hx    SOCIAL HISTORY Social History   Tobacco Use   Smoking status: Former    Current packs/day: 0.00    Average packs/day: 0.5 packs/day for 50.0 years (25.0 ttl pk-yrs)    Types: Cigarettes    Start date: 09/07/1970    Quit date: 09/06/2020    Years since quitting: 2.7   Smokeless tobacco: Never  Vaping Use   Vaping status: Never Used  Substance Use Topics   Alcohol  use: Yes    Comment: 1-2 drinks per week   Drug use: No       OPHTHALMIC EXAM:  Base Eye Exam     Visual Acuity (Snellen - Linear)       Right Left   Dist [place] 20/200 20/25 -1   Dist ph [place] 20/150 -2 20/20         Tonometry (Tonopen, 1:22 PM)       Right Left   Pressure 12 12         Pupils       Pupils Dark Light Shape React APD   Right PERRL 4 3 Round Brisk None   Left PERRL 3 2 Round Brisk None         Visual Fields (Counting fingers)       Left Right    Full Full         Extraocular Movement       Right Left    Full, Ortho Full, Ortho         Neuro/Psych     Oriented  x3: Yes   Mood/Affect: Normal         Dilation     Both eyes: 1.0% Mydriacyl, 2.5% Phenylephrine  @ 1:22 PM           Slit Lamp and Fundus Exam     Slit Lamp Exam       Right Left   Lids/Lashes Dermatochalasis - upper lid Dermatochalasis - upper lid   Conjunctiva/Sclera White and quiet White and quiet   Cornea well healed cataract wound, trace PEE, mild  tear film debris mild arcus, mild tear film debris, well healed cataract wound   Anterior Chamber deep and clear deep and clear   Iris Round and dilated Round and dilated   Lens 3 piece posterior chamber intraocular lens with mild temporal displacement 3 piece posterior chamber intraocular lens in good position   Anterior Vitreous mild syneresis mild syneresis         Fundus Exam       Right Left   Disc mild Pallor, Sharp rim, temporal PPA mild Pallor, Sharp rim, temporal PPA   C/D Ratio 0.3 0.3   Macula Blunted foveal reflex, central CNV with +heme and edema -- heme slightly improved, Drusen, RPE mottling, RPE rip with atrophy, central SRF improved Flat, Blunted foveal reflex, Drusen, RPE mottling and clumping, No heme or edema   Vessels attenuated, Tortuous attenuated, mild tortuosity   Periphery Attached, pigmented paving stone degeneration inferiorly, No heme Attached, reticular degeneration, No heme           IMAGING AND PROCEDURES  Imaging and Procedures for 06/17/2023  OCT, Retina - OU - Both Eyes        Right Eye Quality was good. Central Foveal Thickness: 392. Progression has improved. Findings include no SRF, abnormal foveal contour, retinal drusen , subretinal hyper-reflective material, choroidal neovascular membrane, intraretinal fluid, pigment epithelial detachment (Interval resolution of central SRF, persistent IRF / cystic changes centrally and along ST arcades, persistent central PED / [facility] -- improved).   Left Eye Quality was good. Central Foveal Thickness: 230. Progression has been stable. Findings include normal foveal contour, no IRF, no SRF, retinal drusen , pigment epithelial detachment (Mild patchy ORA).   Notes  *Images captured and stored on drive  Diagnosis / Impression:  OD: Interval resolution of central SRF, persistent IRF / cystic changes centrally and along ST arcades, persistent central PED / SRHM -- improved OS: nonexudative ARMD w/ patchy  ORA  Clinical management:  See below  Abbreviations: NFP - Normal foveal profile. CME - cystoid macular edema. PED - pigment epithelial detachment. IRF - intraretinal fluid. SRF - subretinal fluid. EZ - ellipsoid zone. ERM - epiretinal membrane. ORA - outer retinal atrophy. ORT - outer retinal tubulation. SRHM - subretinal hyper-reflective material. IRHM - intraretinal hyper-reflective material      Intravitreal Injection, Pharmacologic Agent - OD - Right Eye        Time Out 06/17/2023. 1:36 PM. Confirmed correct patient, procedure, site, and patient consented.   Anesthesia Topical anesthesia was used. Anesthetic medications included Lidocaine  2%, Proparacaine 0.5%.   Procedure Preparation included 5% betadine to ocular surface, eyelid speculum. A (32g) needle was used.   Injection: 6 mg faricimab -svoa 6 MG/0.05ML (Patient supplied)   Route: Intravitreal, Site: Right Eye   NDC: 40102-725-36, Lot: [id number], Expiration date: 10/26/2024, Waste: 0 mL   Post-op Post injection exam found visual acuity of at least counting fingers. The patient tolerated the procedure well. There were no complications. The patient received  written and verbal post procedure care education.   Notes  **SAMPLE MEDICATION ADMINISTERED**           ASSESSMENT/PLAN:   ICD-10-CM   1. Exudative age-related macular degeneration of right eye with active choroidal neovascularization (HCC)  H35.3211 OCT, Retina - OU - Both Eyes    Intravitreal Injection, Pharmacologic Agent - OD - Right Eye    faricimab -svoa (VABYSMO ) 6mg /0.9mL intravitreal injection    2. 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 Essential hypertension  I10     4. Hypertensive retinopathy of both eyes  H35.033     5. Pseudophakia, both eyes  Z96.1      1. 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, OD  - s/p IVA OD #1 (01.28.25), #2 (01.28.25), #3 (03.25.25)  - s/p IVV OD #1  (04.21.25 -- sample)  - exam shows central CNV w/ +heme -- improved  - BCVA OD improved to 20/150 from 20/200  - OCT shows Interval resolution of central SRF, persistent IRF / cystic changes centrally and along ST arcades, persistent central PED / [facility] -- improved - recommend IVV OD sample #2 today, 05.21.25 w/ f/u in 4 wks - pt wishes to be treated with IVV - RBA of procedure discussed, questions answered - IVV informed consent obtained and signed, 04.23.25 - see procedure note  - f/u in 4 wks -- DFE/OCT, possible injection   2. Age related macular degeneration, non-exudative, OS  - The incidence, anatomy, and pathology of dry AMD, risk of progression, and the AREDS and AREDS 2 study including smoking risks discussed with patient.  - Recommend amsler grid monitoring  - monitor  3,4. Hypertensive retinopathy OU - discussed importance of tight BP control - monitor  5. Pseudophakia OU  - s/p CE/IOL (Dr. Joyice Nodal)  - IOL in good position, doing well  - monitor  Ophthalmic Meds Ordered this visit:  Meds ordered this encounter  Medications   faricimab -svoa (VABYSMO ) 6mg /0.71mL intravitreal injection     Return in about 4 weeks (around 07/15/2023) for f/u exu ARMD OD, DFE, OCT, Possible Injxn.  There are no Patient Instructions on file for this visit.  Explained the diagnoses, plan, and follow up with the patient and they expressed understanding.  Patient expressed understanding of the importance of proper follow up care.   This document serves as a record of services personally performed by Jeanice Millard, MD, PhD. It was created on their behalf by Angelia Kelp, an ophthalmic technician. The creation of this record is the provider's dictation and/or activities during the visit.    Electronically signed by: Angelia Kelp, OA, 06/17/23  10:38 PM  This document serves as a record of services personally performed by Jeanice Millard, MD, PhD. It was created on their behalf by Morley Arabia. Bevin Bucks, OA an ophthalmic technician. The creation of this record is the provider's dictation and/or activities during the visit.    Electronically signed by: Morley Arabia. Bevin Bucks, OA 06/17/23 10:38 PM  Jeanice Millard, M.D., Ph.D. Diseases & Surgery of the Retina and Vitreous [facility] 06/17/2023  I have reviewed the above documentation for accuracy and completeness, and I agree with the above. Jeanice Millard, M.D., Ph.D. 06/17/23 10:39 PM   Abbreviations: M myopia (nearsighted); A astigmatism; H hyperopia (farsighted); P presbyopia; Mrx spectacle prescription;  CTL contact lenses; OD right eye; OS left eye; OU both eyes  XT exotropia; ET esotropia; PEK  punctate epithelial keratitis; PEE punctate epithelial erosions; DES dry eye syndrome; MGD meibomian gland dysfunction; ATs artificial tears; PFAT's preservative free artificial tears; NSC nuclear sclerotic cataract; PSC posterior subcapsular cataract; ERM epi-retinal membrane; PVD posterior vitreous detachment; RD retinal detachment; DM diabetes mellitus; DR diabetic retinopathy; NPDR non-proliferative diabetic retinopathy; PDR proliferative diabetic retinopathy; CSME clinically significant macular edema; DME diabetic macular edema; dbh dot blot hemorrhages; CWS cotton wool spot; POAG primary open angle glaucoma; C/D cup-to-disc ratio; HVF humphrey visual field; GVF goldmann visual field; OCT optical coherence tomography; IOP intraocular pressure; BRVO Branch retinal vein occlusion; CRVO central retinal vein occlusion; CRAO central retinal artery occlusion; BRAO branch retinal artery occlusion; RT retinal tear; SB scleral buckle; PPV pars plana vitrectomy; VH Vitreous hemorrhage; PRP panretinal laser photocoagulation; IVK intravitreal kenalog ; VMT vitreomacular traction; MH Macular hole;  NVD neovascularization of the disc; NVE neovascularization elsewhere; AREDS age related eye disease study; ARMD age related macular degeneration;  POAG primary open angle glaucoma; EBMD epithelial/anterior basement membrane dystrophy; ACIOL anterior chamber intraocular lens; IOL intraocular lens; PCIOL posterior chamber intraocular lens; Phaco/IOL phacoemulsification with intraocular lens placement; PRK photorefractive keratectomy; LASIK laser assisted in situ keratomileusis; HTN hypertension; DM diabetes mellitus; COPD chronic obstructive pulmonary disease

## 2023-06-17 ENCOUNTER — Ambulatory Visit (INDEPENDENT_AMBULATORY_CARE_PROVIDER_SITE_OTHER): Admitting: Ophthalmology

## 2023-06-17 ENCOUNTER — Encounter (INDEPENDENT_AMBULATORY_CARE_PROVIDER_SITE_OTHER): Payer: Self-pay | Admitting: Ophthalmology

## 2023-06-17 DIAGNOSIS — H35033 Hypertensive retinopathy, bilateral: Secondary | ICD-10-CM | POA: Diagnosis not present

## 2023-06-17 DIAGNOSIS — Z961 Presence of intraocular lens: Secondary | ICD-10-CM | POA: Diagnosis not present

## 2023-06-17 DIAGNOSIS — I10 Essential (primary) hypertension: Secondary | ICD-10-CM | POA: Diagnosis not present

## 2023-06-17 DIAGNOSIS — H353211 Exudative age-related macular degeneration, right eye, with active choroidal neovascularization: Secondary | ICD-10-CM | POA: Diagnosis not present

## 2023-06-17 DIAGNOSIS — H353122 Nonexudative age-related macular degeneration, left eye, intermediate dry stage: Secondary | ICD-10-CM | POA: Diagnosis not present

## 2023-06-17 MED ORDER — FARICIMAB-SVOA 6 MG/0.05ML IZ SOLN
6.0000 mg | INTRAVITREAL | Status: AC | PRN
  Administered 2023-06-17: 6 mg via INTRAVITREAL

## 2023-06-18 ENCOUNTER — Other Ambulatory Visit: Payer: Self-pay

## 2023-06-21 NOTE — Progress Notes (Unsigned)
 Zoe [name], female    DOB: 07-16-1935    MRN: [id number]   Brief patient profile:  69 yowf  quit smoking Aug 2022 due to cough/wheezing but very little need for albuterol   referred to pulmonary clinic in [facility]  06/17/2022 by Dr Darien Eden  for copd eval.   History of Present Illness  06/17/2022  Pulmonary/ 1st office eval/ Zoe [name] / [name] on Advair or Trelegy and can't tell much difference between the two Chief Complaint  Patient presents with   Pulmonary Consult    Referred by Dr. Alvia Awkward for eval of COPD. Pt c/o cough for years. She has been coughing up large amounts of yellow to green sputum over the past 2-3 months.   Dyspnea:  pushes cart at [employer] /uses [employer] due to back / not breathing  Cough: yellowish / green off and on for months  Sleep: bed is flat  2 choking spells "though I was going to die"  in last few months prior to OV  resolved s heimlich "felt like spit was stuck" - takes otc ppi q d pc  SABA use: hfa every few days  02: none  Rec Stop advair/ trelegy (powder inhalers)  Plan A = Automatic = Always=    symbicort  80 Take 2 puffs first thing in am and then another 2 puffs about 12 hours later.  Work on inhaler technique: Plan B = Backup (to supplement plan A, not to replace it) Only use your albuterol  inhaler as a rescue medication Pantoprazole  (protonix ) 40 mg   Take  30-60 min before first meal of the day and Pepcid  (famotidine )  20 mg after supper until return to office  GERD diet reviewed, bed blocks rec  Please schedule a follow up office visit in 6 weeks, call sooner if needed with all respiratory medications /inhalers/ solutions in hand    Late add RUL nodule rx omnicef  x 10 d then ct at 2 weeks   07/29/2022  f/u ov/Zoe [name] office/Zoe [name] re: COPD gold ? / AB  maint on symbicort      f/u spn / did not bring meds   ? On 160 symb vs 80 Chief Complaint  Patient presents with   Follow-up  Dyspnea:  about the same  Cough: non productive  cough - says   trelegy helped cough up mucus better  Sleeping: bed is flat, no more choking spells  SABA use: rarely  02: none  Rec Plan A = Automatic = Always=  Try symbicort  80 Take 2 puffs first thing in am and then another 2 puffs about 12 hours later and take gabapentin  for breakfast, supper and bedtime  If not better after a month then ok to change to Trelegy 100 one click each am  Plan B for breathing = Backup (to supplement plan A, not to replace it) Only use your albuterol  inhaler as a rescue medication Plan C = Crisis (instead of Plan B but only if Plan B stops working) - only use your albuterol  nebulizer if you first try Plan B  For cough/ congestion >   mucinex dm  up to maximum of  1200 mg every 12 hours as needed Continue Pantoprazole  (protonix ) 40 mg   Take  30-60 min before first meal of the day and Pepcid  (famotidine )  20 mg after supper until return to office - this is the best way to tell whether stomach acid is contributing to your problem.  Depomedrol 120 mg IM   Jan  10th  2025 last RT Right side    02/23/2023  ACUTE  ov/[place] office/Zoe [name] re: cough  maint on Trelegy   Chief Complaint  Patient presents with   Acute Visit  Dyspnea:  breathing worse x  weeks Cough: dry hack since April 2024, better since stopped trelegy last 2 d / cough to point of gagging Sleeping: bed is flat and uses wedge otherwise cough/ sob keep her up   SABA use: none  02: none  Rec Try prilosec (omeprazole )   40mg   Take 30-60 min before first meal of the day and Pepcid  ac (famotidine ) 20 mg one  after supper until return  Increase the gabapentin  to three times daily for a week then if still coughing then up to 4 x daily    Stop trelegy  Symbicort  80 Take 2 puffs first thing in am and then another 2 puffs about 12 hours later.  Take delsym two tsp every 12 hours and supplement if needed with tramadol  50 mg up to 1-2 every 4 hours to suppress the urge to cough. Swallowing water  and/or using ice chips/non  mint and menthol  containing candies (such as lifesavers or sugarless jolly ranchers) are also effective.  You should rest your voice and avoid activities that you know make you cough. Once you have eliminated the cough for 3 straight days try reducing the tramadol  first,  then the delsym as tolerated.   Prednisone  10 mg take  4 each am x 2 days,   2 each am x 2 days,  1 each am x 2 days and stop  Only use your albuterol  as a rescue medication t   Please schedule a follow up office visit in 6 weeks, call sooner if needed with all medications /inhalers/ solutions in hand      04/06/2023  f/u ov/[place] office/Zoe [name] re: cough since May 2024  maint on symbicort  t 80 2bid and ? Gabapentin  300 mg bid  did not  bring  meds x for breztri   Chief Complaint  Patient presents with   Follow-up    Follow up 3 week f   Dyspnea:  grocery shopping pushing the cart /balance more than breathing Cough: better than it was on trelegy but still present / not productive  Sleeping:  flat bed 3 pillows every night coughing per husband  SABA use: 2 x daily  02: none  Rec Try breztri  Take 2 puffs first thing in am and then another 2 puffs about 12 hours later. Only use your albuterol  as a rescue medication Increased gabapentin  to  4 x daily to see if this dose helps the cough  Change delsym to mucinex dm 1200 mg every 12 hours as needed   Please schedule a follow up office visit in 6 weeks, call sooner if needed   06/24/2023  f/u ov/Zoe [name] office/Zoe [name] re: cough since May  2024  maint on symbicort  23  Chief Complaint  Patient presents with   Shortness of Breath   Dyspnea:  Not limited by breathing from desired activities   Cough: congestion /hoarseness/ noct wheeze > min mucoid Sleeping: flat bed/ 2 pillow s  resp cc once asleep SABA use: albuterol  hfa works the best" 02: none    No obvious day to day or daytime variability or assoc  mucus plugs or hemoptysis or cp or chest tightness, subjective wheeze  or overt sinus or hb symptoms.    Also denies any obvious fluctuation of symptoms with weather or environmental changes or  other aggravating or alleviating factors except as outlined above   No unusual exposure hx or h/o childhood pna/ asthma or knowledge of premature birth.  Current Allergies, Complete Past Medical History, Past Surgical History, Family History, and Social History were reviewed in [employer] record.  ROS  The following are not active complaints unless bolded Hoarseness, sore throat, dysphagia, dental problems, itching, sneezing,  nasal congestion or discharge of excess mucus or purulent secretions, ear ache,   fever, chills, sweats, unintended wt loss or wt gain, classically pleuritic or exertional cp,  orthopnea pnd or arm/hand swelling  or leg swelling, presyncope, palpitations, abdominal pain, anorexia, nausea, vomiting, diarrhea  or change in bowel habits or change in bladder habits, change in stools or change in urine, dysuria, hematuria,  rash, arthralgias, visual complaints, headache, numbness, weakness or ataxia or problems with walking or coordination,  change in mood or  memory.        Current Meds  Medication Sig   albuterol  (PROVENTIL  HFA;VENTOLIN  HFA) 108 (90 BASE) MCG/ACT inhaler Inhale 2 puffs into the lungs every 6 (six) hours as needed for wheezing or shortness of breath.   allopurinol  (ZYLOPRIM ) 300 MG tablet Take 1 tablet (300 mg total) by mouth daily.   ALPRAZolam  (XANAX ) 0.5 MG tablet Take 0.5 mg by mouth 2 (two) times daily as needed for anxiety.   apixaban  (ELIQUIS ) 5 MG TABS tablet Take 1 tablet (5 mg total) by mouth 2 (two) times daily.   APPLE CIDER VINEGAR PO Take 625 mg by mouth daily.   Biotin  5000 MCG TABS Take 5,000-10,000 mcg by mouth daily.   budesonide -formoterol  (SYMBICORT ) 80-4.5 MCG/ACT inhaler Inhale 2 puffs into the lungs 2 (two) times daily.   calcium-vitamin D  250-100 MG-UNIT tablet Take 1 tablet by mouth daily.    Cholecalciferol (VITAMIN D -3) 125 MCG (5000 UT) TABS Take 5,000 Units by mouth daily.   Cranberry-Vitamin C (CRANBERRY CONCENTRATE/VITAMINC PO) Take 250 mg by mouth daily.   cyanocobalamin  2000 MCG tablet Take 5,000 mcg by mouth daily.   diclofenac  Sodium (VOLTAREN ) 1 % GEL Apply 2-4 g topically 4 (four) times daily. APPLY 2-4 GRAMS TOPICALLY FOUR TIMES DAILY Strength: 1 %   dorzolamide  (TRUSOPT ) 2 % ophthalmic solution Place 1 drop into the right eye daily.   doxepin  (SINEQUAN ) 100 MG capsule Take 1 capsule (100 mg total) by mouth at bedtime.   famotidine  (PEPCID ) 20 MG tablet One after supper   gabapentin  (NEURONTIN ) 300 MG capsule Take 300 mg by mouth 4 (four) times daily as needed (pain).   lactulose  (CHRONULAC ) 10 GM/15ML solution 15 ml daily   levocetirizine (XYZAL) 5 MG tablet SMARTSIG:1 Tablet(s) By Mouth Every Evening   lubiprostone  (AMITIZA ) 24 MCG capsule Take 1 capsule (24 mcg total) by mouth 2 (two) times daily with a meal.   magnesium  oxide (MAG-OX) 400 (240 Mg) MG tablet Take 400 mg by mouth every Monday, Wednesday, and Friday.   meloxicam  (MOBIC ) 15 MG tablet Take 15 mg by mouth daily.   metoprolol  tartrate (LOPRESSOR ) 25 MG tablet Take 0.5 tablets (12.5 mg total) by mouth daily as needed. If BP and HR is greater than 100 and for palpitations   naproxen (NAPROSYN) 500 MG tablet Take 500 mg by mouth 2 (two) times daily as needed.   pantoprazole  (PROTONIX ) 40 MG tablet Take 1 tablet (40 mg total) by mouth daily.   Potassium 99 MG TABS Take 99 mg by mouth every Monday, Wednesday, and Friday.   senna (SENOKOT)  8.6 MG tablet Take 5 tablets by mouth 2 (two) times daily.   Travoprost, BAK Free, (TRAVATAN) 0.004 % SOLN ophthalmic solution Place 1 drop into both eyes at bedtime.   traZODone  (DESYREL ) 100 MG tablet Take 50-100 mg by mouth at bedtime.           Past Medical History:  Diagnosis Date   Abdominal discomfort 11/02/2012   suspected ?UTI- PCP MD placed on Cipro     Anxiety    Arthritis    Asthma    Cancer (HCC)    skin CA   Chronic constipation    Depression    Dysrhythmia    tachycardia   Environmental allergies    Fibromyalgia    GERD (gastroesophageal reflux disease)    Hemorrhoids    Hypercholesteremia    Hypertension    Sleep apnea       Objective:    Wts  06/24/2023      163  04/06/2023       161       02/23/2023        168    07/29/22 172 lb (78 kg)  06/17/22 171 lb (77.6 kg)  06/11/22 170 lb (77.1 kg)    Vital signs reviewed  06/24/2023  - Note at rest 02 sats  93% on RA   General appearance:    somber wf nad       HEENT : Oropharynx  clear   Nasal turbinates nl    NECK :  without  apparent JVD/ palpable Nodes/TM    LUNGS: no acc muscle use,  Min barrel  contour chest wall with bilateral  slightly decreased bs s audible wheeze and  without cough on insp or exp maneuvers and min  Hyperresonant  to  percussion bilaterally    CV:  RRR  no s3 or murmur or increase in P2, and no edema   ABD:  soft and nontender      MS:  Nl gait/ ext warm without deformities Or obvious joint restrictions  calf tenderness, cyanosis or clubbing    SKIN: warm and dry without lesions    NEURO:  alert, approp, nl sensorium with  no motor or cerebellar deficits apparent.           Assessment

## 2023-06-24 ENCOUNTER — Encounter: Payer: Self-pay | Admitting: Internal Medicine

## 2023-06-24 ENCOUNTER — Ambulatory Visit: Admitting: Internal Medicine

## 2023-06-24 VITALS — BP 97/65 | HR 97 | Ht 66.0 in | Wt 163.8 lb

## 2023-06-24 DIAGNOSIS — J45909 Unspecified asthma, uncomplicated: Secondary | ICD-10-CM | POA: Diagnosis not present

## 2023-06-24 DIAGNOSIS — Z87891 Personal history of nicotine dependence: Secondary | ICD-10-CM

## 2023-06-24 DIAGNOSIS — J4520 Mild intermittent asthma, uncomplicated: Secondary | ICD-10-CM

## 2023-06-24 MED ORDER — ALBUTEROL SULFATE (2.5 MG/3ML) 0.083% IN NEBU: 2.5000 mg | INHALATION_SOLUTION | Freq: Four times a day (QID) | RESPIRATORY_TRACT | 12 refills | Status: AC | PRN

## 2023-06-24 MED ORDER — BUDESONIDE 0.25 MG/2ML IN SUSP: 0.2500 mg | Freq: Two times a day (BID) | RESPIRATORY_TRACT | 12 refills | Status: DC

## 2023-06-24 MED ORDER — ALBUTEROL SULFATE HFA 108 (90 BASE) MCG/ACT IN AERS: 2.0000 | INHALATION_SPRAY | Freq: Four times a day (QID) | RESPIRATORY_TRACT | 11 refills | Status: AC | PRN

## 2023-06-24 NOTE — Patient Instructions (Addendum)
 Stop symbiocort  Start nebulizer with albuterol  and budesonide  twice daily  with option of taking up to 4 x daily if helping  If you are  not at home ok to use albuterol  inhaler in place of  nebulizer

## 2023-06-24 NOTE — Assessment & Plan Note (Addendum)
 Quit smoking 2022 due to cough/ wheeze worse since then  - 06/17/2022  After extensive coaching inhaler device,  effectiveness =  75% with hfa so try change dpi to symb 80 2bid as main problem is cough / choking / globus sensation and rx max gerd x 6 week trial > 07/29/2022 lung clear 07/29/2022 and no more noct choking but still globus/ freq throat clearing so rec depomedrol 120/ rechallenge with symb 80 2bid  - PFTs  10/07/22  wnl p Trelegy prior. >>>> 02/23/2023  After extensive coaching inhaler device,  effectiveness =    75% with hfa > changed back to symbicort  80 2bid from trelegy for refractory cough p RT  >>>  04/06/2023 80% still coughing >  trial of breztri  2 bid  - 06/24/2023 change to alb/bud bid to qid and stop all other inhalers x for saba prn when out of house   Pt is predominantly AB/ UACS = Upper airway cough syndrome (previously labeled PNDS),  is so named because it's frequently impossible to sort out how much is  CR/sinusitis with freq throat clearing (which can be related to primary GERD)   vs  causing  secondary (" extra esophageal")  GERD from wide swings in gastric pressure that occur with throat clearing, often  promoting self use of mint and menthol  lozenges that reduce the lower esophageal sphincter tone and exacerbate the problem further in a cyclical fashion.   These are the same pts (now being labeled as having "irritable larynx syndrome" by some cough centers) who not infrequently have a history of having failed to tolerate ace inhibitors,  dry powder inhalers(and sometimes ICS in any form x for neb)  or biphosphonates or report having atypical/extraesophageal reflux symptoms= LPR  that don't respond to standard doses of PPI  and are easily confused as having aecopd or asthma flares by even experienced allergists/ pulmonologists (myself included).   >>> try just saba/bud bid and prn up to qid  similar to nebulized airsupra long with max gerd rx which should include bed  blocks/advised         Each maintenance medication was reviewed in detail including emphasizing most importantly the difference between maintenance and prns and under what circumstances the prns are to be triggered using an action plan format where appropriate.  Total time for H and P, chart review, counseling, reviewing hfa/neb device(s) and generating customized AVS unique to this office visit / same day charting = 40 min  for multiple  refractory respiratory  symptoms of uncertain etiology

## 2023-07-07 ENCOUNTER — Encounter (INDEPENDENT_AMBULATORY_CARE_PROVIDER_SITE_OTHER): Payer: Self-pay | Admitting: Otolaryngology

## 2023-07-07 ENCOUNTER — Ambulatory Visit (INDEPENDENT_AMBULATORY_CARE_PROVIDER_SITE_OTHER): Admitting: Audiology

## 2023-07-07 ENCOUNTER — Ambulatory Visit (INDEPENDENT_AMBULATORY_CARE_PROVIDER_SITE_OTHER): Admitting: Otolaryngology

## 2023-07-07 VITALS — BP 90/59 | HR 86 | Ht 65.0 in | Wt 169.0 lb

## 2023-07-07 DIAGNOSIS — H608X3 Other otitis externa, bilateral: Secondary | ICD-10-CM

## 2023-07-07 DIAGNOSIS — J343 Hypertrophy of nasal turbinates: Secondary | ICD-10-CM | POA: Diagnosis not present

## 2023-07-07 DIAGNOSIS — J342 Deviated nasal septum: Secondary | ICD-10-CM | POA: Diagnosis not present

## 2023-07-07 DIAGNOSIS — J338 Other polyp of sinus: Secondary | ICD-10-CM | POA: Diagnosis not present

## 2023-07-07 DIAGNOSIS — H6122 Impacted cerumen, left ear: Secondary | ICD-10-CM

## 2023-07-07 DIAGNOSIS — H903 Sensorineural hearing loss, bilateral: Secondary | ICD-10-CM

## 2023-07-07 DIAGNOSIS — J310 Chronic rhinitis: Secondary | ICD-10-CM | POA: Diagnosis not present

## 2023-07-07 NOTE — Progress Notes (Signed)
 [facility] - Clinic Note  07/15/2023   CHIEF COMPLAINT Patient presents for Retina Follow Up  HISTORY OF PRESENT ILLNESS: Zoe [name] is a 88 y.o. female who presents to the clinic today for:  HPI     Retina Follow Up   Patient presents with  Wet AMD.  In right eye.  Severity is moderate.  Duration of 4 weeks.  Since onset it is stable.  I, the attending physician,  performed the HPI with the patient and updated documentation appropriately.        Comments   4 week Retina eval. Patient states no vision changes       Last edited by Ronelle Coffee, MD on 07/15/2023  5:09 PM.    Pt feels like she can see a little better, the black spot in her vision is gone    Referring physician: Florance Hun, MD [address] [place],  [place] [address]  HISTORICAL INFORMATION:  Selected notes from the MEDICAL RECORD NUMBER Referred by Dr. Carloyn Chi for concern of exu ARMD OD LEE:  Ocular Hx- PMH-   CURRENT MEDICATIONS: Current Outpatient Medications (Ophthalmic Drugs)  Medication Sig   dorzolamide  (TRUSOPT ) 2 % ophthalmic solution Place 1 drop into the right eye daily.   Travoprost, BAK Free, (TRAVATAN) 0.004 % SOLN ophthalmic solution Place 1 drop into both eyes at bedtime.   No current facility-administered medications for this visit. (Ophthalmic Drugs)   Current Outpatient Medications (Other)  Medication Sig   albuterol  (PROVENTIL ) (2.5 MG/3ML) 0.083% nebulizer solution Take 3 mLs (2.5 mg total) by nebulization every 6 (six) hours as needed.   albuterol  (VENTOLIN  HFA) 108 (90 Base) MCG/ACT inhaler Inhale 2 puffs into the lungs every 6 (six) hours as needed for wheezing or shortness of breath.   allopurinol  (ZYLOPRIM ) 300 MG tablet Take 1 tablet (300 mg total) by mouth daily.   ALPRAZolam  (XANAX ) 0.5 MG tablet Take 0.5 mg by mouth 2 (two) times daily as needed for anxiety.   apixaban  (ELIQUIS ) 5 MG TABS tablet TAKE 1 TABLET BY MOUTH TWICE A DAY   APPLE  CIDER VINEGAR PO Take 625 mg by mouth daily.   Biotin  5000 MCG TABS Take 5,000-10,000 mcg by mouth daily.   budesonide  (PULMICORT ) 0.25 MG/2ML nebulizer solution Take 2 mLs (0.25 mg total) by nebulization 2 (two) times daily.   calcium-vitamin D  250-100 MG-UNIT tablet Take 1 tablet by mouth daily.   Cholecalciferol (VITAMIN D -3) 125 MCG (5000 UT) TABS Take 5,000 Units by mouth daily.   Cranberry-Vitamin C (CRANBERRY CONCENTRATE/VITAMINC PO) Take 250 mg by mouth daily.   cyanocobalamin  2000 MCG tablet Take 5,000 mcg by mouth daily.   diclofenac  Sodium (VOLTAREN ) 1 % GEL Apply 2-4 g topically 4 (four) times daily. APPLY 2-4 GRAMS TOPICALLY FOUR TIMES DAILY Strength: 1 %   doxepin  (SINEQUAN ) 100 MG capsule Take 1 capsule (100 mg total) by mouth at bedtime.   famotidine  (PEPCID ) 20 MG tablet One after supper   gabapentin  (NEURONTIN ) 300 MG capsule Take 300 mg by mouth 4 (four) times daily as needed (pain).   lactulose  (CHRONULAC ) 10 GM/15ML solution 15 ml daily   levocetirizine (XYZAL) 5 MG tablet SMARTSIG:1 Tablet(s) By Mouth Every Evening   lubiprostone  (AMITIZA ) 24 MCG capsule Take 1 capsule (24 mcg total) by mouth 2 (two) times daily with a meal.   magnesium  oxide (MAG-OX) 400 (240 Mg) MG tablet Take 400 mg by mouth every Monday, Wednesday, and Friday.   meloxicam  (  MOBIC ) 15 MG tablet Take 15 mg by mouth daily.   metoprolol  tartrate (LOPRESSOR ) 25 MG tablet Take 0.5 tablets (12.5 mg total) by mouth daily as needed. If BP and HR is greater than 100 and for palpitations   mometasone  (ELOCON ) 0.1 % cream Apply topically daily as needed for itch   naproxen (NAPROSYN) 500 MG tablet Take 500 mg by mouth 2 (two) times daily as needed.   pantoprazole  (PROTONIX ) 40 MG tablet Take 1 tablet (40 mg total) by mouth daily.   Potassium 99 MG TABS Take 99 mg by mouth every Monday, Wednesday, and Friday.   senna (SENOKOT) 8.6 MG tablet Take 5 tablets by mouth 2 (two) times daily.   traZODone  (DESYREL ) 100 MG  tablet Take 50-100 mg by mouth at bedtime.   No current facility-administered medications for this visit. (Other)   Facility-Administered Medications Ordered in Other Visits (Other)  Medication Route   regadenoson  (LEXISCAN ) injection SOLN 0.4 mg Intravenous   technetium tetrofosmin  (TC-MYOVIEW ) injection 31.4 millicurie Intravenous   REVIEW OF SYSTEMS: ROS   Positive for: Eyes, Respiratory, Allergic/Imm Negative for: Constitutional, Gastrointestinal, Neurological, Skin, Genitourinary, Musculoskeletal, HENT, Endocrine, Cardiovascular, Psychiatric, Heme/Lymph Last edited by Leonia Raman, COT on 07/15/2023  1:39 PM.          ALLERGIES Allergies  Allergen Reactions   Codeine Other (See Comments)    Pains in abdominal area   Other     NOVACAINE     ITCHING Anti-depressants   Oxycodone      Abdominal pain, chest pain   Paxil [Paroxetine Hcl] Itching   Procaine Itching   PAST MEDICAL HISTORY Past Medical History:  Diagnosis Date   A-fib [facility])    pt went into A-fib during lung biopsies   Abdominal discomfort 11/02/2012   suspected ?UTI- PCP MD placed on Cipro    Anxiety    Arthritis    Asthma    Cancer (HCC)    skin CA   Choking due to phlegm    Chronic constipation    Depression    Dysrhythmia    tachycardia   Environmental allergies    Fibromyalgia    GERD (gastroesophageal reflux disease)    Hemorrhoids    Hiatal hernia    Hypercholesteremia    Hypertension    Sleep apnea    Past Surgical History:  Procedure Laterality Date   ANAL RECTAL MANOMETRY N/A 11/28/2013   Procedure: ANO RECTAL MANOMETRY;  Surgeon: Joyce Nixon, MD;  Location: [facility] END[facility];  Service: Endoscopy;  Laterality: N/A;   APPENDECTOMY     BACK SURGERY     X2    BIOPSY  03/13/2021   Procedure: BIOPSY;  Surgeon: Ruby Corporal, MD;  Location: [facility];  Service: Endoscopy;;   BRONCHIAL BIOPSY  09/02/2022   Procedure: BRONCHIAL BIOPSIES;  Surgeon: Prudy Brownie, DO;  Location: [facility]  ENDOSCOPY;  Service: Pulmonary;;   BRONCHIAL BRUSHINGS  09/02/2022   Procedure: BRONCHIAL BRUSHINGS;  Surgeon: Prudy Brownie, DO;  [facility]: [facility];  Service: Pulmonary;;   BRONCHIAL NEEDLE ASPIRATION BIOPSY  09/02/2022   Procedure: BRONCHIAL NEEDLE ASPIRATION BIOPSIES;  Surgeon: Prudy Brownie, DO;  Location: [facility];  Service: Pulmonary;;   CHOLECYSTECTOMY     COLONOSCOPY  03/29/03   COLONOSCOPY N/A 12/09/2013   Procedure: COLONOSCOPY;  Surgeon: Ruby Corporal, MD;  [facility];  Service: Endoscopy;  Laterality: N/A;  730   COLONOSCOPY N/A 02/04/2017   Procedure: COLONOSCOPY;  Surgeon: Ruby Corporal,  MD;  [facility];  Service: Endoscopy;  Laterality: N/A;  930   ENDOBRONCHIAL ULTRASOUND Bilateral 09/02/2022   Procedure: ENDOBRONCHIAL ULTRASOUND;  Surgeon: Prudy Brownie, DO;  [facility]: [facility];  Service: Pulmonary;  Laterality: Bilateral;   ESOPHAGEAL DILATION N/A 03/13/2021   Procedure: ESOPHAGEAL DILATION;  Surgeon: Ruby Corporal, MD;  [facility];  Service: Endoscopy;  Laterality: N/A;   ESOPHAGOGASTRODUODENOSCOPY (EGD) WITH PROPOFOL  N/A 03/13/2021   Procedure: ESOPHAGOGASTRODUODENOSCOPY (EGD) WITH PROPOFOL ;  Surgeon: Ruby Corporal, MD;  [facility];  Service: Endoscopy;  Laterality: N/A;  8:05   EYE SURGERY     bilateral cataract removal   INTERCOSTAL NERVE BLOCK Right 11/17/2022   Procedure: INTERCOSTAL NERVE BLOCK;  Surgeon: Hilarie Lovely, MD;  Location: [facility] OR;  Service: Thoracic;  Laterality: Right;   IR KYPHO LUMBAR INC FX REDUCE BONE BX UNI/BIL CANNULATION INC/IMAGING  11/24/2019   IR KYPHO THORACIC WITH BONE BIOPSY  08/23/2019   IR KYPHO THORACIC WITH BONE BIOPSY  11/24/2019   IR RADIOLOGIST EVAL & MGMT  08/03/2019   IR RADIOLOGIST EVAL & MGMT  09/29/2019   IR RADIOLOGIST EVAL & MGMT  10/18/2019   IR RADIOLOGIST EVAL & MGMT  04/11/2020   NODE DISSECTION Right 11/17/2022   Procedure: NODE DISSECTION;   Surgeon: Hilarie Lovely, MD;  Location: [facility];  Service: Thoracic;  Laterality: Right;   SKIN CANCER EXCISION     RIGHT NECK     TONSILLECTOMY     T+A   TOTAL HIP ARTHROPLASTY Right 09/14/2012   Procedure: RIGHT TOTAL HIP ARTHROPLASTY ANTERIOR APPROACH and RIGHT KNEE STEROID INJECTION;  Surgeon: Arnie Lao, MD;  Location: [facility] OR;  Service: Orthopedics;  Laterality: Right;   TOTAL HIP ARTHROPLASTY Left 11/05/2012   Procedure: LEFT TOTAL HIP ARTHROPLASTY ANTERIOR APPROACH;  Surgeon: Arnie Lao, MD;  Location: [facility];  Service: Orthopedics;  Laterality: Left;   UMBILICAL HERNIA REPAIR  04/26/03   JENKINS   UPPER GASTROINTESTINAL ENDOSCOPY  08/03/2009   NUR   UPPER GASTROINTESTINAL ENDOSCOPY  03/29/2003   EGD ED TCS   FAMILY HISTORY Family History  Problem Relation Age of Onset   Healthy Son    Healthy Son    Healthy Daughter    Colon cancer Neg Hx    SOCIAL HISTORY Social History   Tobacco Use   Smoking status: Former    Current packs/day: 0.00    Average packs/day: 0.5 packs/day for 50.0 years (25.0 ttl pk-yrs)    Types: Cigarettes    Start date: 09/07/1970    Quit date: 09/06/2020    Years since quitting: 2.8   Smokeless tobacco: Never  Vaping Use   Vaping status: Never Used  Substance Use Topics   Alcohol  use: Yes    Comment: 1-2 drinks per week   Drug use: No       OPHTHALMIC EXAM:  Base Eye Exam     Visual Acuity (Snellen - Linear)       Right Left   Dist [place] 20/150 -2 20/25 -3   Dist ph  20/NI          Tonometry (Tonopen, 1:41 PM)       Right Left   Pressure 6 8         Pupils       Dark Light Shape React APD   Right 3 2 Round Slow None   Left 3 2 Round Brisk None  Visual Fields (Counting fingers)       Left Right    Full Full         Extraocular Movement       Right Left    Full, Ortho Full, Ortho         Neuro/Psych     Oriented x3: Yes   Mood/Affect: Normal         Dilation      Both eyes: 1.0% Mydriacyl, 2.5% Phenylephrine  @ 1:41 PM           Slit Lamp and Fundus Exam     Slit Lamp Exam       Right Left   Lids/Lashes Dermatochalasis - upper lid Dermatochalasis - upper lid   Conjunctiva/Sclera White and quiet White and quiet   Cornea well healed cataract wound, trace PEE, mild tear film debris mild arcus, mild tear film debris, well healed cataract wound   Anterior Chamber deep and clear deep and clear   Iris Round and dilated Round and dilated   Lens 3 piece posterior chamber intraocular lens with mild temporal displacement 3 piece posterior chamber intraocular lens in good position   Anterior Vitreous mild syneresis mild syneresis         Fundus Exam       Right Left   Disc mild Pallor, Sharp rim, temporal PPA mild Pallor, Sharp rim, temporal PPA   C/D Ratio 0.3 0.3   Macula Blunted foveal reflex, central CNV with +heme and edema -- heme slightly improved, Drusen, RPE mottling, RPE rip with atrophy, central SRF stably improved Flat, Blunted foveal reflex, Drusen, RPE mottling and clumping, No heme or edema   Vessels attenuated, Tortuous attenuated, mild tortuosity   Periphery Attached, pigmented paving stone degeneration inferiorly, No heme Attached, reticular degeneration, No heme           IMAGING AND PROCEDURES  Imaging and Procedures for 07/15/2023  OCT, Retina - OU - Both Eyes       Right Eye Quality was good. Central Foveal Thickness: 350. Progression has improved. Findings include no SRF, abnormal foveal contour, retinal drusen , subretinal hyper-reflective material, choroidal neovascular membrane, intraretinal fluid, pigment epithelial detachment (stable resolution of central SRF, persistent IRF / cystic changes centrally and along ST arcades -- slightly improved, persistent central PED / [facility] -- slightly improved).   Left Eye Quality was good. Central Foveal Thickness: 233. Progression has been stable. Findings include normal foveal  contour, no IRF, no SRF, retinal drusen , pigment epithelial detachment (Mild patchy ORA).   Notes *Images captured and stored on drive  Diagnosis / Impression:  OD: stable resolution of central SRF, persistent IRF / cystic changes centrally and along ST arcades -- slightly improved, persistent central PED / SRHM -- slightly improved OS: nonexudative ARMD w/ patchy ORA  Clinical management:  See below  Abbreviations: NFP - Normal foveal profile. CME - cystoid macular edema. PED - pigment epithelial detachment. IRF - intraretinal fluid. SRF - subretinal fluid. EZ - ellipsoid zone. ERM - epiretinal membrane. ORA - outer retinal atrophy. ORT - outer retinal tubulation. SRHM - subretinal hyper-reflective material. IRHM - intraretinal hyper-reflective material      Intravitreal Injection, Pharmacologic Agent - OD - Right Eye       Time Out 07/15/2023. 2:52 PM. Confirmed correct patient, procedure, site, and patient consented.   Anesthesia Topical anesthesia was used. Anesthetic medications included Lidocaine  2%, Proparacaine 0.5%.   Procedure Preparation included 5% betadine to ocular surface, eyelid  speculum. A (32g) needle was used.   Injection: 6 mg faricimab -svoa 6 MG/0.05ML (Patient supplied)   Route: Intravitreal, Site: Right Eye   NDC: 16109-604-54, Lot: [id number], Expiration date: 10/26/2024, Waste: 0 mL   Post-op Post injection exam found visual acuity of at least counting fingers. The patient tolerated the procedure well. There were no complications. The patient received written and verbal post procedure care education.   Notes **SAMPLE MEDICATION ADMINISTERED**            ASSESSMENT/PLAN:   ICD-10-CM   1. Exudative age-related macular degeneration of right eye with active choroidal neovascularization (HCC)  H35.3211 OCT, Retina - OU - Both Eyes    Intravitreal Injection, Pharmacologic Agent - OD - Right Eye    faricimab -svoa (VABYSMO ) 6mg /0.28mL intravitreal  injection    2. 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 Essential hypertension  I10     4. Hypertensive retinopathy of both eyes  H35.033     5. Pseudophakia, both eyes  Z96.1       1. 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, OD  - s/p IVA OD #1 (01.28.25), #2 (01.28.25), #3 (03.25.25)  - s/p IVV OD #1 (04.21.25 -- sample), #2 (05.21.25--sample)  - exam shows central CNV w/ +heme -- improved  - BCVA OD 20/150 - stable  - OCT shows stable resolution of central SRF, persistent IRF / cystic changes centrally and along ST arcades -- slightly improved, persistent central PED / [facility] -- slightly improved at 4 wks - recommend IVV OD sample #3 today, 06.18.25 w/ f/u in 4 wks - pt wishes to be treated with IVV - RBA of procedure discussed, questions answered - IVV informed consent obtained and signed, 04.23.25 - see procedure note  - f/u in 4 wks -- DFE/OCT, possible injection   2. Age related macular degeneration, non-exudative, OS  - The incidence, anatomy, and pathology of dry AMD, risk of progression, and the AREDS and AREDS 2 study including smoking risks discussed with patient.  - Recommend amsler grid monitoring  - monitor  3,4. Hypertensive retinopathy OU - discussed importance of tight BP control - monitor  5. Pseudophakia OU  - s/p CE/IOL (Dr. Joyice Nodal)  - IOL in good position, doing well  - monitor  Ophthalmic Meds Ordered this visit:  Meds ordered this encounter  Medications   faricimab -svoa (VABYSMO ) 6mg /0.62mL intravitreal injection     Return in 4 weeks (on 08/12/2023) for f/u exu ARMD OD, DFE, OCT, Possible Injxn.  There are no Patient Instructions on file for this visit.  Explained the diagnoses, plan, and follow up with the patient and they expressed understanding.  Patient expressed understanding of the importance of proper follow up care.   This document serves as a record of services personally performed  by Jeanice Millard, MD, PhD. It was created on their behalf by Angelia Kelp, an ophthalmic technician. The creation of this record is the provider's dictation and/or activities during the visit.    Electronically signed by: Angelia Kelp, OA, 07/15/23  5:10 PM  This document serves as a record of services personally performed by Jeanice Millard, MD, PhD. It was created on their behalf by Morley Arabia. Bevin Bucks, OA an ophthalmic technician. The creation of this record is the provider's dictation and/or activities during the visit.    Electronically signed by: Morley Arabia. Bevin Bucks, OA 07/15/23 5:10 PM   Jeanice Millard, M.D., Ph.D. Diseases & Surgery of the  Retina and Vitreous [facility] 07/15/2023  I have reviewed the above documentation for accuracy and completeness, and I agree with the above. Jeanice Millard, M.D., Ph.D. 07/15/23 5:11 PM   Abbreviations: M myopia (nearsighted); A astigmatism; H hyperopia (farsighted); P presbyopia; Mrx spectacle prescription;  CTL contact lenses; OD right eye; OS left eye; OU both eyes  XT exotropia; ET esotropia; PEK punctate epithelial keratitis; PEE punctate epithelial erosions; DES dry eye syndrome; MGD meibomian gland dysfunction; ATs artificial tears; PFAT's preservative free artificial tears; NSC nuclear sclerotic cataract; PSC posterior subcapsular cataract; ERM epi-retinal membrane; PVD posterior vitreous detachment; RD retinal detachment; DM diabetes mellitus; DR diabetic retinopathy; NPDR non-proliferative diabetic retinopathy; PDR proliferative diabetic retinopathy; CSME clinically significant macular edema; DME diabetic macular edema; dbh dot blot hemorrhages; CWS cotton wool spot; POAG primary open angle glaucoma; C/D cup-to-disc ratio; HVF humphrey visual field; GVF goldmann visual field; OCT optical coherence tomography; IOP intraocular pressure; BRVO Branch retinal vein occlusion; CRVO central retinal vein occlusion; CRAO central  retinal artery occlusion; BRAO branch retinal artery occlusion; RT retinal tear; SB scleral buckle; PPV pars plana vitrectomy; VH Vitreous hemorrhage; PRP panretinal laser photocoagulation; IVK intravitreal kenalog ; VMT vitreomacular traction; MH Macular hole;  NVD neovascularization of the disc; NVE neovascularization elsewhere; AREDS age related eye disease study; ARMD age related macular degeneration; POAG primary open angle glaucoma; EBMD epithelial/anterior basement membrane dystrophy; ACIOL anterior chamber intraocular lens; IOL intraocular lens; PCIOL posterior chamber intraocular lens; Phaco/IOL phacoemulsification with intraocular lens placement; PRK photorefractive keratectomy; LASIK laser assisted in situ keratomileusis; HTN hypertension; DM diabetes mellitus; COPD chronic obstructive pulmonary disease

## 2023-07-07 NOTE — Progress Notes (Signed)
  [address] [place], [place] [address] [phone]  Audiological Evaluation    Name: Zoe [name]     DOB:   25-Nov-1935      MRN:   [id number]                                                                                     Service Date: 07/07/2023     Accompanied by: unaccompanied to the booth   Patient comes today after Dr. Darlin Ehrlich, ENT sent a referral for a hearing evaluation due to concerns with hearing loss.   Symptoms Yes Details  Hearing loss  [x]  Trouble hearing others and the TV  Tinnitus  []    Ear pain/ infections/pressure  []    Balance problems  []    Noise exposure history  []    Previous ear surgeries  []    Family history of hearing loss  []    Amplification  []    Other  []      Otoscopy: Right ear: Non-occluding cerumen, able to visualize some tympanic membrane landmarks. Left ear:  Non-occluding cerumen, able to visualize some tympanic membrane landmarks.  Tympanometry: Right ear: Type A- Normal external ear canal volume with normal middle ear pressure and tympanic membrane compliance. Left ear: Type A- Normal external ear canal volume with normal middle ear pressure and tympanic membrane compliance.   Pure tone Audiometry: Normal to moderate sensorineural hearing loss from 250 Hz - 8000 Hz, in both ears.   No significant overall changed noted when compared to patient's audiogram competed at Dr. [facility].     Speech Audiometry: Right ear- Speech Reception Threshold (SRT) was obtained at 25 dBHL. Left ear-Speech Reception Threshold (SRT) was obtained at 30 dBHL.   Word Recognition Score Tested using NU-6 (recorded) Right ear: 88% was obtained at a presentation level of 65 dBHL with contralateral masking which is deemed as  good . Left ear: 88% was obtained at a presentation level of 65 dBHL with contralateral masking which is deemed as  good .   The hearing test results were completed under headphones and results are deemed to be  of good reliability. Test technique:  conventional      Recommendations: Follow up with ENT as scheduled for today. Consider a communication needs assessment after medical clearance for hearing aids is obtained.Consider a hearing spot check with inserts prior to fitting with aids.    Zoe [name] Zoe [name], AUD

## 2023-07-08 MED ORDER — MOMETASONE FUROATE 0.1 % EX CREA
TOPICAL_CREAM | CUTANEOUS | 3 refills | Status: AC
Start: 2023-07-08 — End: ?

## 2023-07-09 DIAGNOSIS — J310 Chronic rhinitis: Secondary | ICD-10-CM | POA: Insufficient documentation

## 2023-07-09 DIAGNOSIS — H6122 Impacted cerumen, left ear: Secondary | ICD-10-CM | POA: Insufficient documentation

## 2023-07-09 DIAGNOSIS — H608X3 Other otitis externa, bilateral: Secondary | ICD-10-CM | POA: Insufficient documentation

## 2023-07-09 DIAGNOSIS — J338 Other polyp of sinus: Secondary | ICD-10-CM | POA: Insufficient documentation

## 2023-07-09 DIAGNOSIS — J342 Deviated nasal septum: Secondary | ICD-10-CM | POA: Insufficient documentation

## 2023-07-09 DIAGNOSIS — H6123 Impacted cerumen, bilateral: Secondary | ICD-10-CM | POA: Insufficient documentation

## 2023-07-09 DIAGNOSIS — J343 Hypertrophy of nasal turbinates: Secondary | ICD-10-CM | POA: Insufficient documentation

## 2023-07-09 DIAGNOSIS — H903 Sensorineural hearing loss, bilateral: Secondary | ICD-10-CM | POA: Insufficient documentation

## 2023-07-09 NOTE — Progress Notes (Signed)
 Patient ID: Zoe [name], female   DOB: January 26, 1936, 88 y.o.   MRN: [id number]  Follow-up: Chronic nasal obstruction, right nasal polyp New complaint: Itchy ears, ear discomfort, hearing loss  HPI: The patient is an 88 year old female who returns today for her follow-up evaluation.  The patient has a history of chronic nasal obstruction, secondary to nasal mucosal congestion, nasal septal deviation, and bilateral inferior turbinate hypertrophy.  She also has a right nasal polyp.  She was treated with high-dose prednisone  and Flonase nasal spray.  She returns today complaining of persistent nasal congestion and anosmia.  She uses a Breathe Right strip at night.  She also complains of itchy ears, bilateral ear discomfort, and hearing loss.  She denies any otorrhea, vertigo, facial pain, fever, or visual change.  She has a history of lung cancer, status post radiation treatment.  She was also recently diagnosed with wet AMD.  Exam: General: Communicates without difficulty, well nourished, no acute distress. Head: Normocephalic, no evidence injury, no tenderness, facial buttresses intact without stepoff. Face/sinus: No tenderness to palpation and percussion. Facial movement is normal and symmetric. Eyes: PERRL, EOMI. No scleral icterus, conjunctivae clear. Neuro: CN II exam reveals vision grossly intact.  No nystagmus at any point of gaze. Ears: Auricles well formed without lesions.  Left ear cerumen impaction.  The right tympanic membrane is intact and mobile.  Eczematous changes are noted within the ear canals.  Nose: External evaluation reveals normal support and skin without lesions.  Dorsum is intact.  Anterior rhinoscopy reveals congested mucosa over anterior aspect of inferior turbinates and deviated septum.  No purulence noted. Oral:  Oral cavity and oropharynx are intact, symmetric, without erythema or edema.  Mucosa is moist without lesions. Neck: Full range of motion without pain.  There is no  significant lymphadenopathy.  No masses palpable.  Thyroid  bed within normal limits to palpation.  Parotid glands and submandibular glands equal bilaterally without mass.  Trachea is midline. Neuro:  CN 2-12 grossly intact.   Procedure:  Flexible Nasal Endoscopy: Description: Risks, benefits, and alternatives of flexible endoscopy were explained to the patient.  Specific mention was made of the risk of throat numbness with difficulty swallowing, possible bleeding from the nose and mouth, and pain from the procedure.  The patient gave oral consent to proceed.  The flexible scope was inserted into the right nasal cavity.  Endoscopy of the interior nasal cavity, superior, inferior, and middle meatus was performed. The sphenoid-ethmoid recess was examined. Edematous mucosa was noted.  Polypoid tissue was noted within the posterior nasal cavity.  Nasal septal deviation noted. Olfactory cleft was clear.  Nasopharynx was clear.  Turbinates were hypertrophied but without mass.  The procedure was repeated on the contralateral side with similar findings.  The patient tolerated the procedure well.   Procedure: Left ear cerumen disimpaction Anesthesia: None Description: Under the operating microscope, the cerumen is carefully removed with a combination of cerumen currette, alligator forceps, and suction catheters.  After the cerumen is removed, the TMs are noted to be normal.  Eczematous changes are noted within the ear canals.  No mass, erythema, or lesions. The patient tolerated the procedure well.    Her hearing test shows bilateral high-frequency sensorineural hearing loss.  Assessment: 1.  Chronic rhinitis with nasal mucosal congestion, nasal septal deviation, and bilateral inferior turbinate hypertrophy.  A polyp is also noted within the right posterior nasal cavity. 2.  Incidental finding of left ear cerumen impaction. 3.  Bilateral chronic eczematous  otitis externa. 4.  No acute infection is noted  today. 5.  Bilateral high-frequency sensorineural hearing loss.  Plan: 1.  Otomicroscopy with left ear cerumen disimpaction. 2.  The physical exam and nasal endoscopy findings are reviewed with the patient. 3.  Elocon cream to treat the chronic eczematous otitis externa. 4.  Continue the use of her Breathe Right strip. 5.  Flonase nasal spray daily if approved by her ophthalmologist.

## 2023-07-14 ENCOUNTER — Other Ambulatory Visit: Payer: Self-pay | Admitting: Internal Medicine

## 2023-07-14 NOTE — Telephone Encounter (Signed)
 Prescription refill request for Eliquis  received. Indication:afib Last office visit:3/25 Scr:0.82  2/25 Age: 88 Weight:76.7  kg  Prescription refilled

## 2023-07-15 ENCOUNTER — Encounter (INDEPENDENT_AMBULATORY_CARE_PROVIDER_SITE_OTHER): Payer: Self-pay | Admitting: Ophthalmology

## 2023-07-15 ENCOUNTER — Ambulatory Visit (INDEPENDENT_AMBULATORY_CARE_PROVIDER_SITE_OTHER): Admitting: Ophthalmology

## 2023-07-15 DIAGNOSIS — Z961 Presence of intraocular lens: Secondary | ICD-10-CM

## 2023-07-15 DIAGNOSIS — I10 Essential (primary) hypertension: Secondary | ICD-10-CM

## 2023-07-15 DIAGNOSIS — H353122 Nonexudative age-related macular degeneration, left eye, intermediate dry stage: Secondary | ICD-10-CM

## 2023-07-15 DIAGNOSIS — H35033 Hypertensive retinopathy, bilateral: Secondary | ICD-10-CM

## 2023-07-15 DIAGNOSIS — H353211 Exudative age-related macular degeneration, right eye, with active choroidal neovascularization: Secondary | ICD-10-CM | POA: Diagnosis not present

## 2023-07-15 MED ORDER — FARICIMAB-SVOA 6 MG/0.05ML IZ SOLN
6.0000 mg | INTRAVITREAL | Status: AC | PRN
  Administered 2023-07-15: 6 mg via INTRAVITREAL

## 2023-07-16 ENCOUNTER — Other Ambulatory Visit: Payer: Self-pay

## 2023-07-30 ENCOUNTER — Telehealth: Payer: Self-pay | Admitting: *Deleted

## 2023-07-30 NOTE — Telephone Encounter (Signed)
 Copied from CRM [id number]. Topic: General - Other >> Jul 29, 2023  4:45 PM [place] S wrote: Reason for CRM: have on nebulizer for month, causing more problems than helping, cough from nebulizer, went back to Symbicort  inhaler, feel very congested and coughing but wondering if I should get another Symbicort  inhaler want to see what is recommended, please call assist patient.   Called the pt and there was no answer- LMTCB and will forward to myself for f/u

## 2023-08-03 NOTE — Telephone Encounter (Signed)
 Patient is returning call from Friday from a nurse leslie ,relayed info that office is close today . Patient is asking for a call back when office is open  [phone]

## 2023-08-03 NOTE — Telephone Encounter (Signed)
 08/03/23 1339 - Spoke with patient's husband.  This was to follow up with them after speaking with Dr. Letty last week regarding patient's pulmonary symptoms and their concerns.  Informed patient's husband that Dr. Letty had continued to recommend patient follow up with pulmonary since he was actively treating her for her symptoms and would be better informed to make a decision if those symptoms should be followed by pulmonary or if there was concern for oncology to follow up.  There is no documentation in patient's chart or care everywhere that the patient or family has reached out to pulmonary since being seen in May.   Patient's husband again expressed concern that her symptoms are not improved under the care of pulmonary despite their treatments and that she needs closer follow up from oncology.  After further discussion with Dr. Letty, he is agreeable to order the CT scan and can then follow up with patient to go over results.  Husband was given this information and was appreciative.

## 2023-08-04 ENCOUNTER — Ambulatory Visit: Payer: Self-pay

## 2023-08-04 DIAGNOSIS — J4520 Mild intermittent asthma, uncomplicated: Secondary | ICD-10-CM

## 2023-08-04 NOTE — Telephone Encounter (Signed)
 FYI Only or Action Required?: Action required by provider: update on patient condition.  Patient is followed in Pulmonology for nodules, last seen on 06/24/2023 by Darlean Ozell NOVAK, MD.  Called Nurse Triage reporting Shortness of Breath and Wheezing.  Symptoms began several months ago.  Interventions attempted: Rescue inhaler.  Symptoms are: gradually worsening.  Triage Disposition: See PCP When Office is Open (Within 3 Days)  Patient/caregiver understands and will follow disposition?: No, wishes to speak with PCP     Copied from CRM [id number]. Topic: Clinical - Red Word Triage >> Aug 04, 2023 11:17 AM Leila C wrote: Red Word that prompted transfer to Nurse Triage: Patient [phone] is checking on medications, needs Dr. Darlean consult on nebulizer and inhaler, called last week and no one has called back. Patient states nebulizer is not working and is out of the inhaler. Patient needs medication as soon as possible. Patient states congestion, fatigue,  wheezing, coughing a little phelgm here and there, denies pain. Please advise. Reason for Disposition  [1] MILD difficulty breathing (e.g., minimal/no SOB at rest, SOB with walking, pulse <100) AND [2] NEW-onset or WORSE than normal  Answer Assessment - Initial Assessment Questions E2C2 Pulmonary Triage - Initial Assessment Questions Chief Complaint (e.g., cough, sob, wheezing, fever, chills, sweat or additional symptoms) *Go to specific symptom protocol after initial questions. Congestion & coughing Recent visit with [facility] tx plan change - now more congested and coughing x 2 day. Returned to Symbicort  for last 2 days  How long have symptoms been present? A few months Reports did not return back to baseline s/p surgery  Have you tested for COVID or Flu? Note: If not, ask patient if a home test can be taken. If so, instruct patient to call back for positive results. No  MEDICINES:   Have you used any OTC meds to help with  symptoms? Yes If yes, ask What medications? Delsym  Have you used your inhalers/maintenance medication? Yes If yes, What medications? Albuterol  INH PRN - 2 puffs 2-3 x a day - reports provides relief Reviewed albuterol  usage Reports out of all other INH  If inhaler, ask How many puffs and how often? Note: Review instructions on medication in the chart. See above  OXYGEN: Do you wear supplemental oxygen? No If yes, How many liters are you supposed to use? N/a  Do you monitor your oxygen levels? No If yes, What is your reading (oxygen level) today? N/a  What is your usual oxygen saturation reading?  (Note: Pulmonary O2 sats should be 90% or greater) N/a    1. RESPIRATORY STATUS: Describe your breathing? (e.g., wheezing, shortness of breath, unable to speak, severe coughing)      See above 2. ONSET: When did this breathing problem begin?      See above 3. PATTERN Does the difficult breathing come and go, or has it been constant since it started?      Constant 4. SEVERITY: How bad is your breathing? (e.g., mild, moderate, severe)    - MILD: No SOB at rest, mild SOB with walking, speaks normally in sentences, can lie down, no retractions, pulse < 100.    - MODERATE: SOB at rest, SOB with minimal exertion and prefers to sit, cannot lie down flat, speaks in phrases, mild retractions, audible wheezing, pulse 100-120.    - SEVERE: Very SOB at rest, speaks in single words, struggling to breathe, sitting hunched forward, retractions, pulse > 120      Mild-moderate Triager does appreciate  audible mild SOB/wheezing during call. Pt is speaking in full sentences.  5. RECURRENT SYMPTOM: Have you had difficulty breathing before? If Yes, ask: When was the last time? and What happened that time?      unknown 6. CARDIAC HISTORY: Do you have any history of heart disease? (e.g., heart attack, angina, bypass surgery, angioplasty)      denies 7. LUNG HISTORY:  Do you have any history of lung disease?  (e.g., pulmonary embolus, asthma, emphysema)     Nodules Hx of DVT 8. CAUSE: What do you think is causing the breathing problem?      unknown 9. OTHER SYMPTOMS: Do you have any other symptoms? (e.g., dizziness, runny nose, cough, chest pain, fever)     Runny nose at baseline 10. O2 SATURATION MONITOR:  Do you use an oxygen saturation monitor (pulse oximeter) at home? If Yes, ask: What is your reading (oxygen level) today? What is your usual oxygen saturation reading? (e.g., 95%)       N/a 11. PREGNANCY: Is there any chance you are pregnant? When was your last menstrual period?       N/a 12. TRAVEL: Have you traveled out of the country in the last month? (e.g., travel history, exposures)       Denies    Pt requesting additional guidance from Dr. Darlean- pt states that new nebulizer regimen from last visit is not providing relief. Pt is currently out of all INH aside from Albuterol  (that provides relief). Triager reviewed albuterol  usage.Triager attempted to schedule with [facility], but no access. Triager will forward encounter for Dr. Darlean 's office to review and advise. Patient verbalized understanding and is expecting call back from office for next steps. Triager also advised that if pt does not hear back from office, to follow disposition for further evaluation/treatment at PCP.  Of note, pt consents to leaving detailed instructions in VM, if needed.  Protocols used: Breathing Difficulty-A-AH

## 2023-08-04 NOTE — Telephone Encounter (Signed)
 Pt state she is experiencing some irritation when using her nebulizer with Symbicort  but Symbicort  inhaler worked better for her and she wants to know if she should stop using the neb and start back with the Symbicort  inhaler. Pt states the nebulizer caused coughing and congestions.

## 2023-08-05 ENCOUNTER — Telehealth (INDEPENDENT_AMBULATORY_CARE_PROVIDER_SITE_OTHER): Payer: Self-pay | Admitting: Otolaryngology

## 2023-08-05 MED ORDER — PREDNISONE 10 MG PO TABS: 10.0000 mg | ORAL_TABLET | Freq: Every day | ORAL | 0 refills | Status: AC

## 2023-08-05 MED ORDER — BUDESONIDE-FORMOTEROL FUMARATE 160-4.5 MCG/ACT IN AERO: 2.0000 | INHALATION_SPRAY | Freq: Two times a day (BID) | RESPIRATORY_TRACT | 1 refills | Status: AC | PRN

## 2023-08-05 NOTE — Telephone Encounter (Addendum)
 Spoke with pt regarding Dr Darlean mcgregor, Sent in the rx to the pharmacy. PT needs appt. NFN

## 2023-08-05 NOTE — Addendum Note (Signed)
 Addended by: RUFFUS LUKES T on: 08/05/2023 09:34 AM   Modules accepted: Orders

## 2023-08-05 NOTE — Telephone Encounter (Signed)
 Patient called in stating that she is still having the same issues along with Pain.  Offered next available - stated she did not want to wait that long.  Can anything be called in or do I need to schedule?  Please advise.

## 2023-08-05 NOTE — Telephone Encounter (Signed)
 See nurse triage note dated 08/04/23

## 2023-08-07 ENCOUNTER — Other Ambulatory Visit: Payer: Self-pay

## 2023-08-11 DIAGNOSIS — C3491 Malignant neoplasm of unspecified part of right bronchus or lung: Secondary | ICD-10-CM | POA: Diagnosis not present

## 2023-08-11 NOTE — Progress Notes (Shared)
 [facility] - Clinic Note  08/14/2023   CHIEF COMPLAINT Patient presents for Retina Follow Up  HISTORY OF PRESENT ILLNESS: Zoe [name] is a 88 y.o. female who presents to the clinic today for:  HPI     Retina Follow Up   Patient presents with  Wet AMD.  In right eye.  Severity is moderate.  Duration of 4 weeks.  Since onset it is stable.  I, the attending physician,  performed the HPI with the patient and updated documentation appropriately.        Comments   4 week Retina eval. Patient states vision comes and goes. Not much change noticed      Last edited by Valdemar Rogue, MD on 08/14/2023  3:27 PM.    Pt feels like her vision is better    Referring physician: Fleeta Zerita DASEN, MD [address] [place],  [place] [address]  HISTORICAL INFORMATION:  Selected notes from the MEDICAL RECORD NUMBER Referred by Dr. Fleeta for concern of exu ARMD OD LEE:  Ocular Hx- PMH-   CURRENT MEDICATIONS: Current Outpatient Medications (Ophthalmic Drugs)  Medication Sig   dorzolamide  (TRUSOPT ) 2 % ophthalmic solution Place 1 drop into the right eye daily.   Travoprost, BAK Free, (TRAVATAN) 0.004 % SOLN ophthalmic solution Place 1 drop into both eyes at bedtime.   No current facility-administered medications for this visit. (Ophthalmic Drugs)   Current Outpatient Medications (Other)  Medication Sig   albuterol  (PROVENTIL ) (2.5 MG/3ML) 0.083% nebulizer solution Take 3 mLs (2.5 mg total) by nebulization every 6 (six) hours as needed.   albuterol  (VENTOLIN  HFA) 108 (90 Base) MCG/ACT inhaler Inhale 2 puffs into the lungs every 6 (six) hours as needed for wheezing or shortness of breath.   allopurinol  (ZYLOPRIM ) 300 MG tablet Take 1 tablet (300 mg total) by mouth daily.   ALPRAZolam  (XANAX ) 0.5 MG tablet Take 0.5 mg by mouth 2 (two) times daily as needed for anxiety.   apixaban  (ELIQUIS ) 5 MG TABS tablet TAKE 1 TABLET BY MOUTH TWICE A DAY   APPLE CIDER VINEGAR PO  Take 625 mg by mouth daily.   Biotin  5000 MCG TABS Take 5,000-10,000 mcg by mouth daily.   budesonide  (PULMICORT ) 0.25 MG/2ML nebulizer solution Take 2 mLs (0.25 mg total) by nebulization 2 (two) times daily.   budesonide -formoterol  (SYMBICORT ) 160-4.5 MCG/ACT inhaler Inhale 2 puffs into the lungs 2 (two) times daily as needed.   calcium-vitamin D  250-100 MG-UNIT tablet Take 1 tablet by mouth daily.   Cholecalciferol (VITAMIN D -3) 125 MCG (5000 UT) TABS Take 5,000 Units by mouth daily.   Cranberry-Vitamin C (CRANBERRY CONCENTRATE/VITAMINC PO) Take 250 mg by mouth daily.   cyanocobalamin  2000 MCG tablet Take 5,000 mcg by mouth daily.   doxepin  (SINEQUAN ) 100 MG capsule Take 1 capsule (100 mg total) by mouth at bedtime.   famotidine  (PEPCID ) 20 MG tablet One after supper   gabapentin  (NEURONTIN ) 300 MG capsule Take 300 mg by mouth 4 (four) times daily as needed (pain).   lactulose  (CHRONULAC ) 10 GM/15ML solution 15 ml daily   levocetirizine (XYZAL) 5 MG tablet SMARTSIG:1 Tablet(s) By Mouth Every Evening   lubiprostone  (AMITIZA ) 24 MCG capsule Take 1 capsule (24 mcg total) by mouth 2 (two) times daily with a meal.   magnesium  oxide (MAG-OX) 400 (240 Mg) MG tablet Take 400 mg by mouth every Monday, Wednesday, and Friday.   meloxicam  (MOBIC ) 15 MG tablet Take 15 mg by mouth daily.   metoprolol   tartrate (LOPRESSOR ) 25 MG tablet Take 0.5 tablets (12.5 mg total) by mouth daily as needed. If BP and HR is greater than 100 and for palpitations   mometasone  (ELOCON ) 0.1 % cream Apply topically daily as needed for itch   naproxen (NAPROSYN) 500 MG tablet Take 500 mg by mouth 2 (two) times daily as needed.   pantoprazole  (PROTONIX ) 40 MG tablet Take 1 tablet (40 mg total) by mouth daily.   Potassium 99 MG TABS Take 99 mg by mouth every Monday, Wednesday, and Friday.   predniSONE  (DELTASONE ) 10 MG tablet Take 1 tablet (10 mg total) by mouth daily with breakfast for 6 days. 10 mg 4 tabs for 2 days 2 tabs for 2  days 1 tab for 2 days Stop   senna (SENOKOT) 8.6 MG tablet Take 5 tablets by mouth 2 (two) times daily.   traZODone  (DESYREL ) 100 MG tablet Take 50-100 mg by mouth at bedtime.   diclofenac  Sodium (VOLTAREN ) 1 % GEL Apply 2-4 g topically 4 (four) times daily. APPLY 2-4 GRAMS TOPICALLY FOUR TIMES DAILY Strength: 1 %   No current facility-administered medications for this visit. (Other)   Facility-Administered Medications Ordered in Other Visits (Other)  Medication Route   regadenoson  (LEXISCAN ) injection SOLN 0.4 mg Intravenous   technetium tetrofosmin  (TC-MYOVIEW ) injection 31.4 millicurie Intravenous   REVIEW OF SYSTEMS: ROS   Positive for: Eyes, Respiratory, Allergic/Imm Negative for: Constitutional, Gastrointestinal, Neurological, Skin, Genitourinary, Musculoskeletal, HENT, Endocrine, Cardiovascular, Psychiatric, Heme/Lymph Last edited by German Olam BRAVO, COT on 08/14/2023 12:48 PM.     ALLERGIES Allergies  Allergen Reactions   Codeine Other (See Comments)    Pains in abdominal area   Other     NOVACAINE     ITCHING Anti-depressants   Oxycodone      Abdominal pain, chest pain   Paxil [Paroxetine Hcl] Itching   Procaine Itching   PAST MEDICAL HISTORY Past Medical History:  Diagnosis Date   A-fib [facility])    pt went into A-fib during lung biopsies   Abdominal discomfort 11/02/2012   suspected ?UTI- PCP MD placed on Cipro    Anxiety    Arthritis    Asthma    Cancer (HCC)    skin CA   Choking due to phlegm    Chronic constipation    Depression    Dysrhythmia    tachycardia   Environmental allergies    Fibromyalgia    GERD (gastroesophageal reflux disease)    Hemorrhoids    Hiatal hernia    Hypercholesteremia    Hypertension    Sleep apnea    Past Surgical History:  Procedure Laterality Date   ANAL RECTAL MANOMETRY N/A 11/28/2013   Procedure: ANO RECTAL MANOMETRY;  Surgeon: Bernarda Ned, MD;  Location: [facility];  Service: Endoscopy;  Laterality: N/A;    APPENDECTOMY     BACK SURGERY     X2    BIOPSY  03/13/2021   Procedure: BIOPSY;  Surgeon: Golda Claudis PENNER, MD;  Location: [facility];  Service: Endoscopy;;   BRONCHIAL BIOPSY  09/02/2022   Procedure: BRONCHIAL BIOPSIES;  Surgeon: Brenna Adine CROME, DO;  Location: [facility];  Service: Pulmonary;;   BRONCHIAL BRUSHINGS  09/02/2022   Procedure: BRONCHIAL BRUSHINGS;  Surgeon: Brenna Adine CROME, DO;  Location: [facility];  Service: Pulmonary;;   BRONCHIAL NEEDLE ASPIRATION BIOPSY  09/02/2022   Procedure: BRONCHIAL NEEDLE ASPIRATION BIOPSIES;  Surgeon: Brenna Adine CROME, DO;  Location: [facility];  Service: Pulmonary;;   CHOLECYSTECTOMY  COLONOSCOPY  03/29/03   COLONOSCOPY N/A 12/09/2013   Procedure: COLONOSCOPY;  Surgeon: Claudis RAYMOND Rivet, MD;  [facility];  Service: Endoscopy;  Laterality: N/A;  730   COLONOSCOPY N/A 02/04/2017   Procedure: COLONOSCOPY;  Surgeon: Rivet Claudis RAYMOND, MD;  [facility];  Service: Endoscopy;  Laterality: N/A;  930   ENDOBRONCHIAL ULTRASOUND Bilateral 09/02/2022   Procedure: ENDOBRONCHIAL ULTRASOUND;  Surgeon: Brenna Adine CROME, DO;  [facility];  Service: Pulmonary;  Laterality: Bilateral;   ESOPHAGEAL DILATION N/A 03/13/2021   Procedure: ESOPHAGEAL DILATION;  Surgeon: Rivet Claudis RAYMOND, MD;  [facility];  Service: Endoscopy;  Laterality: N/A;   ESOPHAGOGASTRODUODENOSCOPY (EGD) WITH PROPOFOL  N/A 03/13/2021   Procedure: ESOPHAGOGASTRODUODENOSCOPY (EGD) WITH PROPOFOL ;  Surgeon: Rivet Claudis RAYMOND, MD;  [facility];  Service: Endoscopy;  Laterality: N/A;  8:05   EYE SURGERY     bilateral cataract removal   INTERCOSTAL NERVE BLOCK Right 11/17/2022   Procedure: INTERCOSTAL NERVE BLOCK;  Surgeon: Shyrl Linnie KIDD, MD;  Location: [facility];  Service: Thoracic;  Laterality: Right;   IR KYPHO LUMBAR INC FX REDUCE BONE BX UNI/BIL CANNULATION INC/IMAGING  11/24/2019   IR KYPHO THORACIC WITH BONE BIOPSY  08/23/2019   IR KYPHO  THORACIC WITH BONE BIOPSY  11/24/2019   IR RADIOLOGIST EVAL & MGMT  08/03/2019   IR RADIOLOGIST EVAL & MGMT  09/29/2019   IR RADIOLOGIST EVAL & MGMT  10/18/2019   IR RADIOLOGIST EVAL & MGMT  04/11/2020   NODE DISSECTION Right 11/17/2022   Procedure: NODE DISSECTION;  Surgeon: Shyrl Linnie KIDD, MD;  Location: [facility];  Service: Thoracic;  Laterality: Right;   SKIN CANCER EXCISION     RIGHT NECK     TONSILLECTOMY     T+A   TOTAL HIP ARTHROPLASTY Right 09/14/2012   Procedure: RIGHT TOTAL HIP ARTHROPLASTY ANTERIOR APPROACH and RIGHT KNEE STEROID INJECTION;  Surgeon: Lonni CINDERELLA Poli, MD;  Location: [facility];  Service: Orthopedics;  Laterality: Right;   TOTAL HIP ARTHROPLASTY Left 11/05/2012   Procedure: LEFT TOTAL HIP ARTHROPLASTY ANTERIOR APPROACH;  Surgeon: Lonni CINDERELLA Poli, MD;  Location: [facility];  Service: Orthopedics;  Laterality: Left;   UMBILICAL HERNIA REPAIR  04/26/03   JENKINS   UPPER GASTROINTESTINAL ENDOSCOPY  08/03/2009   NUR   UPPER GASTROINTESTINAL ENDOSCOPY  03/29/2003   EGD ED TCS   FAMILY HISTORY Family History  Problem Relation Age of Onset   Healthy Son    Healthy Son    Healthy Daughter    Colon cancer Neg Hx    SOCIAL HISTORY Social History   Tobacco Use   Smoking status: Former    Current packs/day: 0.00    Average packs/day: 0.5 packs/day for 50.0 years (25.0 ttl pk-yrs)    Types: Cigarettes    Start date: 09/07/1970    Quit date: 09/06/2020    Years since quitting: 2.9   Smokeless tobacco: Never  Vaping Use   Vaping status: Never Used  Substance Use Topics   Alcohol  use: Yes    Comment: 1-2 drinks per week   Drug use: No       OPHTHALMIC EXAM:  Base Eye Exam     Visual Acuity (Snellen - Linear)       Right Left   Dist [place] 20/400 20/30 -2   Dist ph [place] 20/150 -2 20/25 -3         Tonometry (Tonopen, 12:59 PM)  Right Left   Pressure 7 9         Pupils       Dark Light Shape React APD   Right 3 2 Round Brisk None   Left  3 2 Round Brisk None         Visual Fields (Counting fingers)       Left Right    Full Full         Extraocular Movement       Right Left    Full, Ortho Full, Ortho         Neuro/Psych     Oriented x3: Yes   Mood/Affect: Normal         Dilation     Both eyes: 1.0% Mydriacyl, 2.5% Phenylephrine  @ 12:58 PM           Slit Lamp and Fundus Exam     Slit Lamp Exam       Right Left   Lids/Lashes Dermatochalasis - upper lid Dermatochalasis - upper lid   Conjunctiva/Sclera White and quiet White and quiet   Cornea well healed cataract wound, trace PEE, mild tear film debris mild arcus, mild tear film debris, well healed cataract wound   Anterior Chamber deep and clear deep and clear   Iris Round and dilated Round and dilated   Lens 3 piece posterior chamber intraocular lens with mild temporal displacement 3 piece posterior chamber intraocular lens in good position   Anterior Vitreous mild syneresis mild syneresis         Fundus Exam       Right Left   Disc mild Pallor, Sharp rim, temporal PPA mild Pallor, Sharp rim, temporal PPA   C/D Ratio 0.3 0.3   Macula Blunted foveal reflex, central CNV with +heme and edema -- heme stably resolved, Drusen, RPE mottling, RPE rip with atrophy, central SRF stably improved Flat, Blunted foveal reflex, Drusen, RPE mottling and clumping, No heme or edema   Vessels attenuated, Tortuous attenuated, Tortuous   Periphery Attached, pigmented paving stone degeneration inferiorly, No heme Attached, reticular degeneration, No heme           IMAGING AND PROCEDURES  Imaging and Procedures for 08/14/2023  OCT, Retina - OU - Both Eyes       Right Eye Quality was good. Central Foveal Thickness: 350. Progression has improved. Findings include no SRF, abnormal foveal contour, retinal drusen , subretinal hyper-reflective material, choroidal neovascular membrane, intraretinal fluid, pigment epithelial detachment (stable resolution of  central SRF, persistent IRF / cystic changes centrally and along ST arcades -- slightly improved, persistent central PED / [facility] -- slightly improved -- RPE rip).   Left Eye Quality was good. Central Foveal Thickness: 242. Progression has been stable. Findings include normal foveal contour, no IRF, no SRF, retinal drusen , pigment epithelial detachment (Mild patchy ORA).   Notes *Images captured and stored on drive  Diagnosis / Impression:  OD: stable resolution of central SRF, persistent IRF / cystic changes centrally and along ST arcades -- slightly improved, persistent central PED / SRHM -- slightly improved OS: nonexudative ARMD w/ patchy ORA  Clinical management:  See below  Abbreviations: NFP - Normal foveal profile. CME - cystoid macular edema. PED - pigment epithelial detachment. IRF - intraretinal fluid. SRF - subretinal fluid. EZ - ellipsoid zone. ERM - epiretinal membrane. ORA - outer retinal atrophy. ORT - outer retinal tubulation. SRHM - subretinal hyper-reflective material. IRHM - intraretinal hyper-reflective material  Intravitreal Injection, Pharmacologic Agent - OD - Right Eye       Time Out 08/14/2023. 1:38 PM. Confirmed correct patient, procedure, site, and patient consented.   Anesthesia Topical anesthesia was used. Anesthetic medications included Lidocaine  2%, Proparacaine 0.5%.   Procedure Preparation included 5% betadine to ocular surface, eyelid speculum. A (32g) needle was used.   Injection: 6 mg faricimab -svoa 6 MG/0.05ML (Patient supplied)   Route: Intravitreal, Site: Right Eye   NDC: 49757-903-98, Lot: [id number], Expiration date: 07/26/2025, Waste: 0 mL   Post-op Post injection exam found visual acuity of at least counting fingers. The patient tolerated the procedure well. There were no complications. The patient received written and verbal post procedure care education.   Notes **SAMPLE MEDICATION ADMINISTERED**              ASSESSMENT/PLAN:   ICD-10-CM   1. Exudative age-related macular degeneration of right eye with active choroidal neovascularization (HCC)  H35.3211 OCT, Retina - OU - Both Eyes    Intravitreal Injection, Pharmacologic Agent - OD - Right Eye    faricimab -svoa (VABYSMO ) 6mg /0.49mL intravitreal injection    2. 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 Essential hypertension  I10     4. Hypertensive retinopathy of both eyes  H35.033     5. Pseudophakia, both eyes  Z96.1      1. 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, OD  - s/p IVA OD #1 (01.28.25), #2 (01.28.25), #3 (03.25.25) #4(06.18.2025)  - s/p IVV OD #1 (04.21.25 -- sample), #2 (05.21.25--sample), #3 (06.18.25--sample)  - exam shows central CNV w/ +heme -- improved  - BCVA OD 20/150 - stable  - OCT shows stable resolution of central SRF, persistent IRF / cystic changes centrally and along ST arcades -- slightly improved, persistent central PED / [facility] -- slightly improved at 4 wks - recommend IVV OD sample #4 today, 07.18.25 w/ f/u extended to 6 wks - pt wishes to be treated with IVV - RBA of procedure discussed, questions answered - IVV informed consent obtained and signed, 04.23.25 - see procedure note  - f/u in 6 wks -- DFE/OCT, possible injection   2. Age related macular degeneration, non-exudative, OS  - The incidence, anatomy, and pathology of dry AMD, risk of progression, and the AREDS and AREDS 2 study including smoking risks discussed with patient.  - Recommend amsler grid monitoring  - monitor  3,4. Hypertensive retinopathy OU - discussed importance of tight BP control - monitor  5. Pseudophakia OU  - s/p CE/IOL (Dr. Alverta)  - IOL in good position, doing well  - monitor  Ophthalmic Meds Ordered this visit:  Meds ordered this encounter  Medications   faricimab -svoa (VABYSMO ) 6mg /0.60mL intravitreal injection     Return in about 6 weeks (around 09/25/2023)  for f/u exu ARMD OD, DFE, OCT, Possible Injxn.  There are no Patient Instructions on file for this visit.  This document serves as a record of services personally performed by Redell JUDITHANN Hans, MD, PhD. It was created on their behalf by Delon Newness COT, an ophthalmic technician. The creation of this record is the provider's dictation and/or activities during the visit.    Electronically signed by: Delon Newness COT 07.15.2025 3:32 PM  This document serves as a record of services personally performed by Redell JUDITHANN Hans, MD, PhD. It was created on their behalf by Alan PARAS. Delores, OA an ophthalmic technician. The creation of this record is the provider's dictation  and/or activities during the visit.    Electronically signed by: Alan PARAS. Delores, OA 08/14/23 3:32 PM  Redell JUDITHANN Hans, M.D., Ph.D. Diseases & Surgery of the Retina and Vitreous [facility] 08/14/2023   I have reviewed the above documentation for accuracy and completeness, and I agree with the above. Redell JUDITHANN Hans, M.D., Ph.D. 08/14/23 3:32 PM   Abbreviations: M myopia (nearsighted); A astigmatism; H hyperopia (farsighted); P presbyopia; Mrx spectacle prescription;  CTL contact lenses; OD right eye; OS left eye; OU both eyes  XT exotropia; ET esotropia; PEK punctate epithelial keratitis; PEE punctate epithelial erosions; DES dry eye syndrome; MGD meibomian gland dysfunction; ATs artificial tears; PFAT's preservative free artificial tears; NSC nuclear sclerotic cataract; PSC posterior subcapsular cataract; ERM epi-retinal membrane; PVD posterior vitreous detachment; RD retinal detachment; DM diabetes mellitus; DR diabetic retinopathy; NPDR non-proliferative diabetic retinopathy; PDR proliferative diabetic retinopathy; CSME clinically significant macular edema; DME diabetic macular edema; dbh dot blot hemorrhages; CWS cotton wool spot; POAG primary open angle glaucoma; C/D cup-to-disc ratio; HVF humphrey  visual field; GVF goldmann visual field; OCT optical coherence tomography; IOP intraocular pressure; BRVO Branch retinal vein occlusion; CRVO central retinal vein occlusion; CRAO central retinal artery occlusion; BRAO branch retinal artery occlusion; RT retinal tear; SB scleral buckle; PPV pars plana vitrectomy; VH Vitreous hemorrhage; PRP panretinal laser photocoagulation; IVK intravitreal kenalog ; VMT vitreomacular traction; MH Macular hole;  NVD neovascularization of the disc; NVE neovascularization elsewhere; AREDS age related eye disease study; ARMD age related macular degeneration; POAG primary open angle glaucoma; EBMD epithelial/anterior basement membrane dystrophy; ACIOL anterior chamber intraocular lens; IOL intraocular lens; PCIOL posterior chamber intraocular lens; Phaco/IOL phacoemulsification with intraocular lens placement; PRK photorefractive keratectomy; LASIK laser assisted in situ keratomileusis; HTN hypertension; DM diabetes mellitus; COPD chronic obstructive pulmonary disease

## 2023-08-13 ENCOUNTER — Ambulatory Visit (INDEPENDENT_AMBULATORY_CARE_PROVIDER_SITE_OTHER): Admitting: Otolaryngology

## 2023-08-13 ENCOUNTER — Encounter (INDEPENDENT_AMBULATORY_CARE_PROVIDER_SITE_OTHER): Payer: Self-pay | Admitting: Otolaryngology

## 2023-08-13 VITALS — BP 96/63 | HR 87

## 2023-08-13 DIAGNOSIS — H608X3 Other otitis externa, bilateral: Secondary | ICD-10-CM

## 2023-08-13 DIAGNOSIS — R0981 Nasal congestion: Secondary | ICD-10-CM

## 2023-08-13 DIAGNOSIS — H903 Sensorineural hearing loss, bilateral: Secondary | ICD-10-CM | POA: Diagnosis not present

## 2023-08-13 DIAGNOSIS — J342 Deviated nasal septum: Secondary | ICD-10-CM | POA: Diagnosis not present

## 2023-08-13 DIAGNOSIS — J310 Chronic rhinitis: Secondary | ICD-10-CM

## 2023-08-13 DIAGNOSIS — H6121 Impacted cerumen, right ear: Secondary | ICD-10-CM | POA: Diagnosis not present

## 2023-08-13 DIAGNOSIS — J338 Other polyp of sinus: Secondary | ICD-10-CM

## 2023-08-13 DIAGNOSIS — J343 Hypertrophy of nasal turbinates: Secondary | ICD-10-CM

## 2023-08-14 ENCOUNTER — Ambulatory Visit (INDEPENDENT_AMBULATORY_CARE_PROVIDER_SITE_OTHER): Admitting: Ophthalmology

## 2023-08-14 ENCOUNTER — Other Ambulatory Visit: Payer: Self-pay

## 2023-08-14 ENCOUNTER — Encounter (INDEPENDENT_AMBULATORY_CARE_PROVIDER_SITE_OTHER): Payer: Self-pay | Admitting: Ophthalmology

## 2023-08-14 DIAGNOSIS — I10 Essential (primary) hypertension: Secondary | ICD-10-CM

## 2023-08-14 DIAGNOSIS — H35033 Hypertensive retinopathy, bilateral: Secondary | ICD-10-CM | POA: Diagnosis not present

## 2023-08-14 DIAGNOSIS — H353211 Exudative age-related macular degeneration, right eye, with active choroidal neovascularization: Secondary | ICD-10-CM

## 2023-08-14 DIAGNOSIS — Z961 Presence of intraocular lens: Secondary | ICD-10-CM

## 2023-08-14 DIAGNOSIS — H353122 Nonexudative age-related macular degeneration, left eye, intermediate dry stage: Secondary | ICD-10-CM

## 2023-08-14 MED ORDER — FARICIMAB-SVOA 6 MG/0.05ML IZ SOLN
6.0000 mg | INTRAVITREAL | Status: AC | PRN
  Administered 2023-08-14: 6 mg via INTRAVITREAL

## 2023-08-16 DIAGNOSIS — H6121 Impacted cerumen, right ear: Secondary | ICD-10-CM | POA: Insufficient documentation

## 2023-08-16 NOTE — Progress Notes (Signed)
 Patient ID: Zoe [name], female   DOB: 1935-10-15, 88 y.o.   MRN: [id number]  Follow-up: Itchy ears, hearing loss, chronic nasal congestion  HPI: The patient is an 88 year old female who returns today complaining of persistent itchy sensation in her ears.  The patient was last seen in June 2025.  At that time, she was noted to have chronic eczematous otitis externa.  She also has a history of chronic nasal congestion, secondary to nasal mucosal congestion, nasal septal deviation, and bilateral inferior turbinate hypertrophy.  She also has a right posterior nasal polyp.  The patient was treated with Elocon  cream and Flonase nasal spray.  The patient returns today complaining of persistent itchy sensation in her ears.  She denies any significant otalgia, otorrhea, or change in her hearing.  She still has occasional nasal congestion.  She was recently placed on 10 mg of prednisone  for 10 days, which has helped with her itchy ears and nasal congestion.  Exam: General: Communicates without difficulty, well nourished, no acute distress. Head: Normocephalic, no evidence injury, no tenderness, facial buttresses intact without stepoff. Face/sinus: No tenderness to palpation and percussion. Facial movement is normal and symmetric. Eyes: PERRL, EOMI. No scleral icterus, conjunctivae clear. Neuro: CN II exam reveals vision grossly intact.  No nystagmus at any point of gaze. Ears: Auricles well formed without lesions.  Right ear cerumen impaction.  Eczematous changes are noted within the ear canals.  Nose: External evaluation reveals normal support and skin without lesions.  Dorsum is intact.  Anterior rhinoscopy reveals congested mucosa over anterior aspect of inferior turbinates and intact septum.  No purulence noted. Oral:  Oral cavity and oropharynx are intact, symmetric, without erythema or edema.  Mucosa is moist without lesions. Neck: Full range of motion without pain.  There is no significant lymphadenopathy.   No masses palpable.  Thyroid  bed within normal limits to palpation.  Parotid glands and submandibular glands equal bilaterally without mass.  Trachea is midline. Neuro:  CN 2-12 grossly intact.   Procedure: Right ear cerumen disimpaction Anesthesia: None Description: Under the operating microscope, the cerumen is carefully removed with a combination of cerumen currette, alligator forceps, and suction catheters.  After the cerumen is removed, the TMs are noted to be normal.  No mass, erythema, or lesions. The patient tolerated the procedure well.    Assessment: 1.  Bilateral chronic eczematous otitis externa. 2.  Subjectively stable bilateral high-frequency sensorineural hearing loss. 3.  Right ear cerumen impaction.  After the disimpaction procedure, both tympanic membranes and middle ear spaces are noted to be normal. 4.  Chronic rhinitis with nasal mucosal congestion, nasal septal deviation, bilateral inferior turbinate hypertrophy, and right nasal polyp.  Plan: 1.  The physical exam findings are reviewed with the patient. 2.  Otomicroscopy with right ear cerumen disimpaction. 3.  Elocon  cream and prednisolone eardrops to treat the chronic eczematous otitis externa. 4.  Flonase nasal spray to treat the chronic nasal congestion. 5.  The patient will return for reevaluation in 3 to 4 months.

## 2023-08-23 NOTE — Progress Notes (Unsigned)
 Zoe [name], female    DOB: [birth year]    MRN: [id number]   Brief patient profile:  82 yowf  quit smoking Aug 2022 due to cough/wheezing but very little need for albuterol   referred to pulmonary clinic in [facility]  06/17/2022 by Dr Rosamond  for copd eval.   History of Present Illness  06/17/2022  Pulmonary/ 1st office eval/ Zaide Mcclenahan / [place] [facility] on Advair or Trelegy and can't tell much difference between the two Chief Complaint  Patient presents with   Pulmonary Consult    Referred by Dr. Leta Rosamond for eval of COPD. Pt c/o cough for years. She has been coughing up large amounts of yellow to green sputum over the past 2-3 months.   Dyspnea:  pushes cart at [employer] /uses [facility] due to back / not breathing  Cough: yellowish / green off and on for months  Sleep: bed is flat  2 choking spells though I was going to die  in last few months prior to OV  resolved s heimlich felt like spit was stuck - takes otc ppi q d pc  SABA use: hfa every few days  02: none  Rec Stop advair/ trelegy (powder inhalers)  Plan A = Automatic = Always=    symbicort  80 Take 2 puffs first thing in am and then another 2 puffs about 12 hours later.  Work on inhaler technique: Plan B = Backup (to supplement plan A, not to replace it) Only use your albuterol  inhaler as a rescue medication Pantoprazole  (protonix ) 40 mg   Take  30-60 min before first meal of the day and Pepcid  (famotidine )  20 mg after supper until return to office  GERD diet reviewed, bed blocks rec  Please schedule a follow up office visit in 6 weeks, call sooner if needed with all respiratory medications /inhalers/ solutions in hand    Late add RUL nodule rx omnicef  x 10 d then ct at 2 weeks   07/29/2022  f/u ov/[place] office/Edwen Mclester re: COPD gold ? / AB  maint on symbicort      f/u spn / did not bring meds   ? On 160 symb vs 80 Chief Complaint  Patient presents with   Follow-up  Dyspnea:  about the same  Cough: non productive  cough - says   trelegy helped cough up mucus better  Sleeping: bed is flat, no more choking spells  SABA use: rarely  02: none  Rec Plan A = Automatic = Always=  Try symbicort  80 Take 2 puffs first thing in am and then another 2 puffs about 12 hours later and take gabapentin  for breakfast, supper and bedtime  If not better after a month then ok to change to Trelegy 100 one click each am  Plan B for breathing = Backup (to supplement plan A, not to replace it) Only use your albuterol  inhaler as a rescue medication Plan C = Crisis (instead of Plan B but only if Plan B stops working) - only use your albuterol  nebulizer if you first try Plan B  For cough/ congestion >   mucinex dm  up to maximum of  1200 mg every 12 hours as needed Continue Pantoprazole  (protonix ) 40 mg   Take  30-60 min before first meal of the day and Pepcid  (famotidine )  20 mg after supper until return to office - this is the best way to tell whether stomach acid is contributing to your problem.  Depomedrol 120 mg IM   Jan  10th  2025 last RT Right side    02/23/2023  ACUTE  ov/[place] office/Donnah Levert re: cough  maint on Trelegy   Chief Complaint  Patient presents with   Acute Visit  Dyspnea:  breathing worse x  weeks Cough: dry hack since April 2024, better since stopped trelegy last 2 d / cough to point of gagging Sleeping: bed is flat and uses wedge otherwise cough/ sob keep her up   SABA use: none  02: none  Rec Try prilosec (omeprazole )   40mg   Take 30-60 min before first meal of the day and Pepcid  ac (famotidine ) 20 mg one  after supper until return  Increase the gabapentin  to three times daily for a week then if still coughing then up to 4 x daily    Stop trelegy  Symbicort  80 Take 2 puffs first thing in am and then another 2 puffs about 12 hours later.  Take delsym two tsp every 12 hours and supplement if needed with tramadol  50 mg up to 1-2 every 4 hours to suppress the urge to cough. Swallowing water  and/or using ice chips/non  mint and menthol  containing candies (such as lifesavers or sugarless jolly ranchers) are also effective.  You should rest your voice and avoid activities that you know make you cough. Once you have eliminated the cough for 3 straight days try reducing the tramadol  first,  then the delsym as tolerated.   Prednisone  10 mg take  4 each am x 2 days,   2 each am x 2 days,  1 each am x 2 days and stop  Only use your albuterol  as a rescue medication t   Please schedule a follow up office visit in 6 weeks, call sooner if needed with all medications /inhalers/ solutions in hand      04/06/2023  f/u ov/[place] office/Devra Stare re: cough since May 2024  maint on symbicort  t 80 2bid and ? Gabapentin  300 mg bid  did not  bring  meds x for breztri   Chief Complaint  Patient presents with   Follow-up    Follow up 3 week f   Dyspnea:  grocery shopping pushing the cart /balance more than breathing Cough: better than it was on trelegy but still present / not productive  Sleeping:  flat bed 3 pillows every night coughing per husband  SABA use: 2 x daily  02: none  Rec Try breztri  Take 2 puffs first thing in am and then another 2 puffs about 12 hours later. Only use your albuterol  as a rescue medication Increased gabapentin  to  4 x daily to see if this dose helps the cough  Change delsym to mucinex dm 1200 mg every 12 hours as needed   Please schedule a follow up office visit in 6 weeks, call sooner if needed   06/24/2023  f/u ov/[place] office/Tameya Kuznia re: cough since May  2024  maint on symbicort  27  Chief Complaint  Patient presents with   Shortness of Breath  Dyspnea:  Not limited by breathing from desired activities   Cough: congestion /hoarseness/ noct wheeze > min mucoid Sleeping: flat bed/ 2 pillow s  resp cc once asleep SABA use: albuterol  hfa works the best 02: none  Rec Stop symbiocort Start nebulizer with albuterol  and budesonide  twice daily  with option of taking up to 4 x daily if  helping  If you are  not at home ok to use albuterol  inhaler in place of  nebulizer    08/24/2023  f/u  ov/[place] office/Nazier Neyhart re: cough since May  2024  symbicort  160  No chief complaint on file. Dyspnea:  hips and legs stop her when bend over  Cough: hoarse worse daytime min mucuic Sleeping: flat bed with one pillow  s  resp cc  SABA use: hfa works better than neb  02: not using though has it  Aug f/u at [facility] pending      No obvious day to day or daytime variability or assoc excess/ purulent sputum or mucus plugs or hemoptysis or cp or chest tightness, subjective wheeze or overt sinus or hb symptoms.    Also denies any obvious fluctuation of symptoms with weather or environmental changes or other aggravating or alleviating factors except as outlined above   No unusual exposure hx or h/o childhood pna/ asthma or knowledge of premature birth.  Current Allergies, Complete Past Medical History, Past Surgical History, Family History, and Social History were reviewed in [employer] record.  ROS  The following are not active complaints unless bolded Hoarseness, sore throat, dysphagia, dental problems, itching, sneezing,  nasal congestion or discharge of excess mucus or purulent secretions, ear ache,   fever, chills, sweats, unintended wt loss or wt gain, classically pleuritic or exertional cp,  orthopnea pnd or arm/hand swelling  or leg swelling, presyncope, palpitations, abdominal pain, anorexia, nausea, vomiting, diarrhea  or change in bowel habits or change in bladder habits, change in stools or change in urine, dysuria, hematuria,  rash, arthralgias, visual complaints, headache, numbness, weakness or ataxia or problems with walking or coordination,  change in mood or  memory.        Current Meds  Medication Sig   albuterol  (PROVENTIL ) (2.5 MG/3ML) 0.083% nebulizer solution Take 3 mLs (2.5 mg total) by nebulization every 6 (six) hours as needed.   albuterol   (VENTOLIN  HFA) 108 (90 Base) MCG/ACT inhaler Inhale 2 puffs into the lungs every 6 (six) hours as needed for wheezing or shortness of breath.   allopurinol  (ZYLOPRIM ) 300 MG tablet Take 1 tablet (300 mg total) by mouth daily.   ALPRAZolam  (XANAX ) 0.5 MG tablet Take 0.5 mg by mouth 2 (two) times daily as needed for anxiety.   apixaban  (ELIQUIS ) 5 MG TABS tablet TAKE 1 TABLET BY MOUTH TWICE A DAY   APPLE CIDER VINEGAR PO Take 625 mg by mouth daily.   Biotin  5000 MCG TABS Take 5,000-10,000 mcg by mouth daily.   budesonide -formoterol  (SYMBICORT ) 160-4.5 MCG/ACT inhaler Inhale 2 puffs into the lungs 2 (two) times daily as needed.   calcium-vitamin D  250-100 MG-UNIT tablet Take 1 tablet by mouth daily.   Cholecalciferol (VITAMIN D -3) 125 MCG (5000 UT) TABS Take 5,000 Units by mouth daily.   cyanocobalamin  2000 MCG tablet Take 5,000 mcg by mouth daily.   diclofenac  Sodium (VOLTAREN ) 1 % GEL Apply 2-4 g topically 4 (four) times daily. APPLY 2-4 GRAMS TOPICALLY FOUR TIMES DAILY Strength: 1 %   dorzolamide  (TRUSOPT ) 2 % ophthalmic solution Place 1 drop into the right eye daily.   doxepin  (SINEQUAN ) 100 MG capsule Take 1 capsule (100 mg total) by mouth at bedtime.   gabapentin  (NEURONTIN ) 300 MG capsule Take 300 mg by mouth 4 (four) times daily as needed (pain).   levocetirizine (XYZAL) 5 MG tablet SMARTSIG:1 Tablet(s) By Mouth Every Evening   lubiprostone  (AMITIZA ) 24 MCG capsule Take 1 capsule (24 mcg total) by mouth 2 (two) times daily with a meal.   magnesium  oxide (MAG-OX) 400 (240 Mg) MG tablet Take 400  mg by mouth every Monday, Wednesday, and Friday.   meloxicam  (MOBIC ) 15 MG tablet Take 15 mg by mouth daily.   metoprolol  tartrate (LOPRESSOR ) 25 MG tablet Take 0.5 tablets (12.5 mg total) by mouth daily as needed. If BP and HR is greater than 100 and for palpitations   mometasone  (ELOCON ) 0.1 % cream Apply topically daily as needed for itch   Potassium 99 MG TABS Take 99 mg by mouth every Monday,  Wednesday, and Friday.   senna (SENOKOT) 8.6 MG tablet Take 5 tablets by mouth 2 (two) times daily.   Travoprost, BAK Free, (TRAVATAN) 0.004 % SOLN ophthalmic solution Place 1 drop into both eyes at bedtime.           Past Medical History:  Diagnosis Date   Abdominal discomfort 11/02/2012   suspected ?UTI- PCP MD placed on Cipro    Anxiety    Arthritis    Asthma    Cancer (HCC)    skin CA   Chronic constipation    Depression    Dysrhythmia    tachycardia   Environmental allergies    Fibromyalgia    GERD (gastroesophageal reflux disease)    Hemorrhoids    Hypercholesteremia    Hypertension    Sleep apnea       Objective:    Wts  08/24/2023       164  06/24/2023       163  04/06/2023       161       02/23/2023        168    07/29/22 172 lb (78 kg)  06/17/22 171 lb (77.6 kg)  06/11/22 170 lb (77.1 kg)    Vital signs reviewed  08/24/2023  - Note at rest 02 sats  94% on RA   General appearance:    hoase wf/ freq tc/ lung clear /hopeless affect     Min barr***.           Assessment

## 2023-08-24 ENCOUNTER — Encounter: Payer: Self-pay | Admitting: Internal Medicine

## 2023-08-24 ENCOUNTER — Ambulatory Visit: Admitting: Internal Medicine

## 2023-08-24 VITALS — BP 94/60 | HR 85 | Ht 65.0 in | Wt 164.4 lb

## 2023-08-24 DIAGNOSIS — R058 Other specified cough: Secondary | ICD-10-CM

## 2023-08-24 NOTE — Patient Instructions (Addendum)
 Stop all inhalers except  albuterol  either inhaler or nebulizer up to 4 hours if needed but try to avoid using if you don't really need tehm   Gabapentin  300mg  three times daily if toleratd   Prednisone  10 mg take  4 each am x 2 days,   2 each am x 2 days,  1 each am x 2 days and stop   Omeprazole  40 mg Take 30-60 min before first meal of the day and Pepcid  20 mg after supper  For cough > delsym  2 tsp every 12 hours   GERD (REFLUX)  is an extremely common cause of respiratory symptoms just like yours , many times with no obvious heartburn at all.    It can be treated with medication, but also with lifestyle changes including elevation of the head of your bed (ideally with 6 -8inch blocks under the headboard of your bed),  Smoking cessation, avoidance of late meals, excessive alcohol , and avoid fatty foods, chocolate, peppermint, colas, red wine, and acidic juices such as orange juice.  NO MINT OR MENTHOL  PRODUCTS SO NO COUGH DROPS - LUDENs Pectin USE SUGARLESS CANDY INSTEAD (Jolley ranchers or [employer] or Environmental manager) or even ice chips will also do - the key is to swallow to prevent all throat clearing. NO OIL BASED VITAMINS - use powdered substitutes.  Avoid fish oil when coughing.   Please schedule a follow up office visit in 4 weeks, sooner if needed  with all medications /inhalers/ solutions in hand so we can verify exactly what you are taking. This includes all medications from all doctors and over the counters

## 2023-08-25 NOTE — Assessment & Plan Note (Addendum)
 Quit smoking Aug 2022 - onset of cough May 2024  - Rx cyclical cough regimen with tramadol , max gerd rx, off DPI's and on symbicort  80 2bid  02/23/23 > improved but did not resolve   >>>08/24/2023 rec repeat cyclical cough rx s tramadol  this time and off all hfa x prn hfa which she insists works better than any thing else including neb hfa Stop all inhalers except  albuterol  either inhaler or nebulizer up to 4 hours if needed but try to avoid using if you don't really need tehm   Gabapentin  300mg  three times daily if toleratd   Prednisone  10 mg take  4 each am x 2 days,   2 each am x 2 days,  1 each am x 2 days and stop   Omeprazole  40 mg Take 30-60 min before first meal of the day and Pepcid  20 mg after supper  For cough > delsym  2 tsp every 12 hours   Proceed with ct cheat as planned by oncology  F/u 4 weeks with all meds in hand using a trust but verify approach to confirm accurate Medication  Reconciliation The principal here is that until we are certain that the  patients are doing what we've asked, it makes no sense to ask them to do more.          Each maintenance medication was reviewed in detail including emphasizing most importantly the difference between maintenance and prns and under what circumstances the prns are to be triggered using an action plan format where appropriate.  Total time for H and P, chart review, counseling, reviewing hfa/ neb device(s) and generating customized AVS unique to this office visit / same day charting = 31 min    for   refractory respiratory  symptoms of uncertain etiology

## 2023-08-28 DIAGNOSIS — H401132 Primary open-angle glaucoma, bilateral, moderate stage: Secondary | ICD-10-CM | POA: Diagnosis not present

## 2023-08-28 DIAGNOSIS — H353211 Exudative age-related macular degeneration, right eye, with active choroidal neovascularization: Secondary | ICD-10-CM | POA: Diagnosis not present

## 2023-08-28 DIAGNOSIS — H353132 Nonexudative age-related macular degeneration, bilateral, intermediate dry stage: Secondary | ICD-10-CM | POA: Diagnosis not present

## 2023-08-28 DIAGNOSIS — H35032 Hypertensive retinopathy, left eye: Secondary | ICD-10-CM | POA: Diagnosis not present

## 2023-08-28 DIAGNOSIS — H524 Presbyopia: Secondary | ICD-10-CM | POA: Diagnosis not present

## 2023-08-31 ENCOUNTER — Other Ambulatory Visit: Payer: Self-pay | Admitting: Internal Medicine

## 2023-08-31 DIAGNOSIS — J4520 Mild intermittent asthma, uncomplicated: Secondary | ICD-10-CM

## 2023-08-31 NOTE — Telephone Encounter (Signed)
 Please advise if appropriate for pt to continue prednisone 

## 2023-09-03 ENCOUNTER — Telehealth: Payer: Self-pay

## 2023-09-03 DIAGNOSIS — I10 Essential (primary) hypertension: Secondary | ICD-10-CM | POA: Diagnosis not present

## 2023-09-03 DIAGNOSIS — Z299 Encounter for prophylactic measures, unspecified: Secondary | ICD-10-CM | POA: Diagnosis not present

## 2023-09-03 DIAGNOSIS — C3411 Malignant neoplasm of upper lobe, right bronchus or lung: Secondary | ICD-10-CM | POA: Diagnosis not present

## 2023-09-03 DIAGNOSIS — K649 Unspecified hemorrhoids: Secondary | ICD-10-CM | POA: Diagnosis not present

## 2023-09-03 DIAGNOSIS — R52 Pain, unspecified: Secondary | ICD-10-CM | POA: Diagnosis not present

## 2023-09-03 NOTE — Telephone Encounter (Unsigned)
 Copied from CRM [id number]. Topic: Clinical - Prescription Issue >> Sep 03, 2023 11:08 AM Russell PARAS wrote: Reason for CRM:   Pt is contacting clinic regarding a course of prednisone  that Wert advised he would send to the [facility] in [place]. She last saw him on 08/24/2023. Reviewed chart and order was placed on 08/4, but is awaiting provider signature.   Pt requested call back when the prescription has been sent to pharmacy  CB#  [phone]     NFN-

## 2023-09-04 ENCOUNTER — Other Ambulatory Visit: Payer: Self-pay

## 2023-09-04 MED ORDER — PREDNISONE 10 MG PO TABS: ORAL_TABLET | ORAL | 0 refills | Status: DC

## 2023-09-07 ENCOUNTER — Other Ambulatory Visit: Payer: Self-pay

## 2023-09-07 DIAGNOSIS — C3491 Malignant neoplasm of unspecified part of right bronchus or lung: Secondary | ICD-10-CM | POA: Diagnosis not present

## 2023-09-07 DIAGNOSIS — J9811 Atelectasis: Secondary | ICD-10-CM | POA: Diagnosis not present

## 2023-09-07 MED ORDER — METOPROLOL TARTRATE 25 MG PO TABS: 12.5000 mg | ORAL_TABLET | Freq: Every day | ORAL | 2 refills | Status: DC | PRN

## 2023-09-17 DIAGNOSIS — R52 Pain, unspecified: Secondary | ICD-10-CM | POA: Diagnosis not present

## 2023-09-17 DIAGNOSIS — F1320 Sedative, hypnotic or anxiolytic dependence, uncomplicated: Secondary | ICD-10-CM | POA: Diagnosis not present

## 2023-09-17 DIAGNOSIS — C771 Secondary and unspecified malignant neoplasm of intrathoracic lymph nodes: Secondary | ICD-10-CM | POA: Diagnosis not present

## 2023-09-17 DIAGNOSIS — Z299 Encounter for prophylactic measures, unspecified: Secondary | ICD-10-CM | POA: Diagnosis not present

## 2023-09-17 DIAGNOSIS — I480 Paroxysmal atrial fibrillation: Secondary | ICD-10-CM | POA: Diagnosis not present

## 2023-09-17 DIAGNOSIS — I10 Essential (primary) hypertension: Secondary | ICD-10-CM | POA: Diagnosis not present

## 2023-09-17 DIAGNOSIS — J449 Chronic obstructive pulmonary disease, unspecified: Secondary | ICD-10-CM | POA: Diagnosis not present

## 2023-09-20 NOTE — Progress Notes (Addendum)
 Ronal SHAUNNA Mas, female    DOB: [birth year]    MRN: [id number]   Brief patient profile:  70 yowf  quit smoking Aug 2022 due to cough/wheezing but very little need for albuterol   referred to pulmonary clinic in [facility]  06/17/2022 by Dr Rosamond  for copd eval.   History of Present Illness  06/17/2022  Pulmonary/ 1st office eval/ Dena Esperanza / [place] [facility] on Advair or Trelegy and can't tell much difference between the two Chief Complaint  Patient presents with   Pulmonary Consult    Referred by Dr. Leta Rosamond for eval of COPD. Pt c/o cough for years. She has been coughing up large amounts of yellow to green sputum over the past 2-3 months.   Dyspnea:  pushes cart at [employer] /uses [facility] due to back / not breathing  Cough: yellowish / green off and on for months  Sleep: bed is flat  2 choking spells though I was going to die  in last few months prior to OV  resolved s heimlich felt like spit was stuck - takes otc ppi q d pc  SABA use: hfa every few days  02: none  Rec Stop advair/ trelegy (powder inhalers)  Plan A = Automatic = Always=    symbicort  80 Take 2 puffs first thing in am and then another 2 puffs about 12 hours later.  Work on inhaler technique: Plan B = Backup (to supplement plan A, not to replace it) Only use your albuterol  inhaler as a rescue medication Pantoprazole  (protonix ) 40 mg   Take  30-60 min before first meal of the day and Pepcid  (famotidine )  20 mg after supper until return to office  GERD diet reviewed, bed blocks rec  Please schedule a follow up office visit in 6 weeks, call sooner if needed with all respiratory medications /inhalers/ solutions in hand    Late add RUL nodule rx omnicef  x 10 d then ct at 2 weeks   07/29/2022  f/u ov/[place] office/Jacelynn Hayton re: COPD gold ? / AB  maint on symbicort      f/u spn / did not bring meds   ? On 160 symb vs 80 Chief Complaint  Patient presents with   Follow-up  Dyspnea:  about the same  Cough: non productive  cough - says   trelegy helped cough up mucus better  Sleeping: bed is flat, no more choking spells  SABA use: rarely  02: none  Rec Plan A = Automatic = Always=  Try symbicort  80 Take 2 puffs first thing in am and then another 2 puffs about 12 hours later and take gabapentin  for breakfast, supper and bedtime  If not better after a month then ok to change to Trelegy 100 one click each am  Plan B for breathing = Backup (to supplement plan A, not to replace it) Only use your albuterol  inhaler as a rescue medication Plan C = Crisis (instead of Plan B but only if Plan B stops working) - only use your albuterol  nebulizer if you first try Plan B  For cough/ congestion >   mucinex dm  up to maximum of  1200 mg every 12 hours as needed Continue Pantoprazole  (protonix ) 40 mg   Take  30-60 min before first meal of the day and Pepcid  (famotidine )  20 mg after supper until return to office - this is the best way to tell whether stomach acid is contributing to your problem.  Depomedrol 120 mg IM   Jan  10th  2025 last RT Right side   06/24/2023  f/u ov/[place] office/Hadasa Gasner re: cough since May  2024  maint on symbicort  82  Chief Complaint  Patient presents with   Shortness of Breath  Dyspnea:  Not limited by breathing from desired activities   Cough: congestion /hoarseness/ noct wheeze > min mucoid Sleeping: flat bed/ 2 pillow s  resp cc once asleep SABA use: albuterol  hfa works the best 02: none  Rec Stop symbiocort Start nebulizer with albuterol  and budesonide  twice daily  with option of taking up to 4 x daily if helping  If you are  not at home ok to use albuterol  inhaler in place of  nebulizer    08/24/2023  f/u ov/[place] office/Lisel Siegrist re: cough since May  2024  symbicort  160   Did not bring meds as requested  Dyspnea:  hips and legs stop her before her breathing/ sob  when bend over  Cough: hoarse worse daytime min mucoid sputum sporadic daytime only, not noct Sleeping: flat bed with one pillow  s   resp cc  SABA use: hfa works better than neb albuterol   02: not using though has it  Aug f/u  [facility] at [facility] pending  Rec Stop all inhalers except  albuterol  either inhaler or nebulizer up to 4 hours if needed but try to avoid using if you don't really need them  Gabapentin  300mg  three times daily if tolerated Prednisone  10 mg take  4 each am x 2 days,   2 each am x 2 days,  1 each am x 2 days and stop  Omeprazole  40 mg Take 30-60 min before first meal of the day and Pepcid  20 mg after supper For cough > delsym  2 tsp every 12 hours  GERD diet reviewed, bed blocks rec  Please schedule a follow up office visit in 4 weeks, sooner if needed  with all medications /inhalers/ solutions in hand     09/21/2023  f/u ov/[place] office/Shirel Mallis re: cough x May 2024/ abn CT? Recurrent Lung Ca   maint on gabapentin  300 mg three times a day / feels prednisone  helps the most / brought all meds Chief Complaint  Patient presents with   Cough    shob  Dyspnea:  more limited by back pain than breathing  Cough: min productive white  Sleeping: flat bed/ one pillow s resp cc  SABA use: twice daily  neb  seems to help  02: prn     No obvious day to day or daytime variability or assoc  purulent sputum or mucus plugs or hemoptysis or cp or chest tightness, subjective wheeze or overt sinus or hb symptoms.    Also denies any obvious fluctuation of symptoms with weather or environmental changes or other aggravating or alleviating factors except as outlined above   No unusual exposure hx or h/o childhood pna/ asthma or knowledge of premature birth.  Current Allergies, Complete Past Medical History, Past Surgical History, Family History, and Social History were reviewed in [employer] record.  ROS  The following are not active complaints unless bolded Hoarseness, sore throat, dysphagia, dental problems, itching, sneezing,  nasal congestion or discharge of excess mucus or purulent secretions,  ear ache,   fever, chills, sweats, unintended wt loss or wt gain, classically pleuritic or exertional cp,  orthopnea pnd or arm/hand swelling  or leg swelling, presyncope, palpitations, abdominal pain, anorexia, nausea, vomiting, diarrhea  or change in bowel habits or change in bladder habits,  change in stools or change in urine, dysuria, hematuria,  rash, arthralgias, visual complaints, headache, numbness, weakness or ataxia or problems with walking or coordination,  change in mood or  memory.         Med reconciliation done and list corrected see below      Past Medical History:  Diagnosis Date   Abdominal discomfort 11/02/2012   suspected ?UTI- PCP MD placed on Cipro    Anxiety    Arthritis    Asthma    Cancer (HCC)    skin CA   Chronic constipation    Depression    Dysrhythmia    tachycardia   Environmental allergies    Fibromyalgia    GERD (gastroesophageal reflux disease)    Hemorrhoids    Hypercholesteremia    Hypertension    Sleep apnea       Objective:    Wts  09/21/2023       165  08/24/2023       164  06/24/2023       163  04/06/2023       161       02/23/2023        168    07/29/22 172 lb (78 kg)  06/17/22 171 lb (77.6 kg)  06/11/22 170 lb (77.1 kg)    Vital signs reviewed  09/21/2023  - Note at rest 02 sats  95% on RA   General appearance:   hoarse  somber amb wf nad / freq throat clearing      HEENT : Oropharynx  clear   Nasal turbinates nl    NECK :  without  apparent JVD/ palpable Nodes/TM    LUNGS: no acc muscle use,  Min barrel  contour chest wall with bilateral  slightly decreased bs s audible wheeze and  without cough on insp or exp maneuvers and min  Hyperresonant  to  percussion bilaterally    CV:  RRR  no s3 or murmur or increase in P2, and no edema   ABD:  soft and nontender   MS:  Nl gait/ ext warm without deformities Or obvious joint restrictions  calf tenderness, cyanosis or clubbing     SKIN: warm and dry without lesions    NEURO:   alert, approp, nl sensorium with  no motor or cerebellar deficits apparent.        CT chest with contrast [facility]  09/07/23  1. Indeterminate new right peribronchial and subcarinal soft tissue thickening with increasing right upper lobar bronchial narrowing and perihilar atelectatic consolidation. Recommend further evaluation with PET/CT.   2. No new metastatic disease in the abdomen and pelvis.      Assessment   Assessment & Plan Upper airway cough syndrome Quit smoking Aug 2022 - onset of cough May 2024  - Rx cyclical cough regimen with tramadol , max gerd rx, off DPI's and on symbicort  80 2bid  02/23/23 > improved but did not resolve  - 08/24/2023 rec repeat cyclical cough rx s tramadol  this time and off all hfa x prn hfa which she insists works better than any thing else including neb hfa  - increased gabapentin  to 400 mg bid to help with back pain as well     Mild intermittent asthmatic bronchitis without complication Quit smoking 2022 due to cough/ wheeze worse since then  - 06/17/2022  After extensive coaching inhaler device,  effectiveness =  75% with hfa so try change dpi to symb 80 2bid as main problem is cough /  choking / globus sensation and rx max gerd x 6 week trial > 07/29/2022 lung clear 07/29/2022 and no more noct choking but still globus/ freq throat clearing so rec depomedrol 120/ rechallenge with symb 80 2bid  - PFTs  10/07/22  wnl p Trelegy prior. >>>> 02/23/2023  After extensive coaching inhaler device,  effectiveness =    75% with hfa > changed back to symbicort  80 2bid from trelegy for refractory cough p RT  >>>  04/06/2023 80% still coughing >  trial of breztri  2 bid  - 06/24/2023 change to alb/bud bid to qid and stop all other inhalers x for saba prn when out of house  - 09/21/2023  After extensive coaching inhaler device,  effectiveness =    90% and using saba bid so rec breyna  (symbicort )  160 Take 2 puffs first thing in am and then another 2 puffs about 12 hours later.  - Added  Plan D to action plan:10 mg x 2 until better then 1 daily x 5 days then one half   PFTs normalize p saba so this is AB (not classic copd)  and laba/ics should do fine:   breyna  160 2bid plus new floor for prednisone  of 5 mg   Discussed in detail all the  indications, usual  risks and alternatives  relative to the benefits with patient who agrees to proceed with Rx as outlined.       Solitary pulmonary nodule Quit smoking 08/2020  with cxr 06/17/22 :   2.5 cm R mid lung likely RUL on lateral view - CT chest s contrast 07/30/2022 >>> RUL ant segment slt spiculated nodule> PET 08/14/22 POS RUL/ Hilar node uptake > referred to I[facility] 08/25/2022  - CT chest 09/07/23 c contrast new R peribronchcial and subcarinal soft tissue thickening with RUL orifice narrowing and perhilar atx > reviewed with pt 09/21/2023  / f/u at [facility] planned   Each and every medication she brought with her was reviewed in detail including emphasizing most importantly the difference between maintenance and prns and under what circumstances the prns are to be triggered using an action plan format where appropriate.  Total time for H and P, chart review, counseling, reviewing hfa / neb device(s) and generating customized AVS unique to this office visit / same day charting =     AVS  Patient Instructions  Gabapentin  300 mg x 2 in am and 2 in pm (helps with pain and cough)  Resume  breyna  160 Take 2 puffs first thing in am and then another 2 puffs about 12 hours later.   Plan B  : albuterol  2 puffs very 4 hours  Plan C  if  B not working: nebulizer up to 4 hours   Pland D  if abc not working: Pred 20 mg per day until better then 10 mg per day x  Please schedule a follow up visit in 3 months but call sooner if needed     Outpatient Encounter Medications as of 09/21/2023  Medication Sig   predniSONE  (DELTASONE ) 10 MG tablet For breathing or pain increase prednisone   to 10 mg x 2 until better then 1 daily x 5 days then one half    albuterol  (PROVENTIL ) (2.5 MG/3ML) 0.083% nebulizer solution Take 3 mLs (2.5 mg total) by nebulization every 6 (six) hours as needed.   albuterol  (VENTOLIN  HFA) 108 (90 Base) MCG/ACT inhaler Inhale 2 puffs into the lungs every 6 (six) hours as needed for wheezing or shortness  of breath.   allopurinol  (ZYLOPRIM ) 300 MG tablet Take 1 tablet (300 mg total) by mouth daily.   ALPRAZolam  (XANAX ) 0.5 MG tablet Take 0.5 mg by mouth 2 (two) times daily as needed for anxiety.   apixaban  (ELIQUIS ) 5 MG TABS tablet TAKE 1 TABLET BY MOUTH TWICE A DAY   APPLE CIDER VINEGAR PO Take 625 mg by mouth daily.   Biotin  5000 MCG TABS Take 5,000-10,000 mcg by mouth daily.   budesonide -formoterol  (SYMBICORT ) 160-4.5 MCG/ACT inhaler Inhale 2 puffs into the lungs 2 (two) times daily as needed.   calcium-vitamin D  250-100 MG-UNIT tablet Take 1 tablet by mouth daily.   Cholecalciferol (VITAMIN D -3) 125 MCG (5000 UT) TABS Take 5,000 Units by mouth daily.   cyanocobalamin  2000 MCG tablet Take 5,000 mcg by mouth daily.   diclofenac  Sodium (VOLTAREN ) 1 % GEL Apply 2-4 g topically 4 (four) times daily. APPLY 2-4 GRAMS TOPICALLY FOUR TIMES DAILY Strength: 1 %   dorzolamide  (TRUSOPT ) 2 % ophthalmic solution Place 1 drop into the right eye daily.   doxepin  (SINEQUAN ) 100 MG capsule Take 1 capsule (100 mg total) by mouth at bedtime.   gabapentin  (NEURONTIN ) 300 MG capsule Take 300 mg by mouth 4 (four) times daily as needed (pain).   levocetirizine (XYZAL) 5 MG tablet SMARTSIG:1 Tablet(s) By Mouth Every Evening   lubiprostone  (AMITIZA ) 24 MCG capsule Take 1 capsule (24 mcg total) by mouth 2 (two) times daily with a meal.   magnesium  oxide (MAG-OX) 400 (240 Mg) MG tablet Take 400 mg by mouth every Monday, Wednesday, and Friday.   meloxicam  (MOBIC ) 15 MG tablet Take 15 mg by mouth daily.   metoprolol  tartrate (LOPRESSOR ) 25 MG tablet Take 0.5 tablets (12.5 mg total) by mouth daily as needed. If BP and HR is greater than 100 and for  palpitations   mometasone  (ELOCON ) 0.1 % cream Apply topically daily as needed for itch   Potassium 99 MG TABS Take 99 mg by mouth every Monday, Wednesday, and Friday.   senna (SENOKOT) 8.6 MG tablet Take 5 tablets by mouth 2 (two) times daily.   Travoprost, BAK Free, (TRAVATAN) 0.004 % SOLN ophthalmic solution Place 1 drop into both eyes at bedtime.    Omeprazole / famotidine  otc      Ozell America, MD 09/27/2023

## 2023-09-21 ENCOUNTER — Ambulatory Visit: Admitting: Internal Medicine

## 2023-09-21 ENCOUNTER — Encounter: Payer: Self-pay | Admitting: Internal Medicine

## 2023-09-21 VITALS — BP 102/68 | HR 93 | Ht 65.0 in | Wt 165.0 lb

## 2023-09-21 DIAGNOSIS — R058 Other specified cough: Secondary | ICD-10-CM | POA: Diagnosis not present

## 2023-09-21 DIAGNOSIS — R911 Solitary pulmonary nodule: Secondary | ICD-10-CM

## 2023-09-21 DIAGNOSIS — J4520 Mild intermittent asthma, uncomplicated: Secondary | ICD-10-CM

## 2023-09-21 MED ORDER — PREDNISONE 10 MG PO TABS: ORAL_TABLET | ORAL | 0 refills | Status: AC

## 2023-09-21 NOTE — Patient Instructions (Addendum)
 Gabapentin  300 mg x 2 in am and 2 in pm (helps with pain and cough)  Resume  breyna  160 Take 2 puffs first thing in am and then another 2 puffs about 12 hours later.   Plan B  : albuterol  2 puffs very 4 hours  Plan C  if  B not working: nebulizer up to 4 hours   Pland D  if abc not working: Pred 20 mg per day until better then 10 mg per day x  Please schedule a follow up visit in 3 months but call sooner if needed

## 2023-09-22 ENCOUNTER — Other Ambulatory Visit: Payer: Self-pay

## 2023-09-23 DIAGNOSIS — C781 Secondary malignant neoplasm of mediastinum: Secondary | ICD-10-CM | POA: Diagnosis not present

## 2023-09-23 DIAGNOSIS — C3411 Malignant neoplasm of upper lobe, right bronchus or lung: Secondary | ICD-10-CM | POA: Diagnosis not present

## 2023-09-23 DIAGNOSIS — D72820 Lymphocytosis (symptomatic): Secondary | ICD-10-CM | POA: Diagnosis not present

## 2023-09-23 DIAGNOSIS — R911 Solitary pulmonary nodule: Secondary | ICD-10-CM | POA: Diagnosis not present

## 2023-09-23 DIAGNOSIS — D649 Anemia, unspecified: Secondary | ICD-10-CM | POA: Diagnosis not present

## 2023-09-23 DIAGNOSIS — C3491 Malignant neoplasm of unspecified part of right bronchus or lung: Secondary | ICD-10-CM | POA: Diagnosis not present

## 2023-09-25 ENCOUNTER — Encounter (INDEPENDENT_AMBULATORY_CARE_PROVIDER_SITE_OTHER): Admitting: Ophthalmology

## 2023-09-25 NOTE — Progress Notes (Shared)
 [facility] - Clinic Note  09/30/2023   CHIEF COMPLAINT Patient presents for No chief complaint on file.  HISTORY OF PRESENT ILLNESS: Zoe [name] is a 88 y.o. female who presents to the clinic today for:   Pt states     Referring physician: Fleeta Zerita DASEN, MD [address] [place],  [place] [address]  HISTORICAL INFORMATION:  Selected notes from the MEDICAL RECORD NUMBER Referred by Dr. Fleeta for concern of exu ARMD OD LEE:  Ocular Hx- PMH-   CURRENT MEDICATIONS: Current Outpatient Medications (Ophthalmic Drugs)  Medication Sig   dorzolamide  (TRUSOPT ) 2 % ophthalmic solution Place 1 drop into the right eye daily.   Travoprost, BAK Free, (TRAVATAN) 0.004 % SOLN ophthalmic solution Place 1 drop into both eyes at bedtime.   No current facility-administered medications for this visit. (Ophthalmic Drugs)   Current Outpatient Medications (Other)  Medication Sig   albuterol  (PROVENTIL ) (2.5 MG/3ML) 0.083% nebulizer solution Take 3 mLs (2.5 mg total) by nebulization every 6 (six) hours as needed.   albuterol  (VENTOLIN  HFA) 108 (90 Base) MCG/ACT inhaler Inhale 2 puffs into the lungs every 6 (six) hours as needed for wheezing or shortness of breath.   allopurinol  (ZYLOPRIM ) 300 MG tablet Take 1 tablet (300 mg total) by mouth daily.   ALPRAZolam  (XANAX ) 0.5 MG tablet Take 0.5 mg by mouth 2 (two) times daily as needed for anxiety.   apixaban  (ELIQUIS ) 5 MG TABS tablet TAKE 1 TABLET BY MOUTH TWICE A DAY   APPLE CIDER VINEGAR PO Take 625 mg by mouth daily.   Biotin  5000 MCG TABS Take 5,000-10,000 mcg by mouth daily.   budesonide -formoterol  (SYMBICORT ) 160-4.5 MCG/ACT inhaler Inhale 2 puffs into the lungs 2 (two) times daily as needed.   calcium-vitamin D  250-100 MG-UNIT tablet Take 1 tablet by mouth daily.   Cholecalciferol (VITAMIN D -3) 125 MCG (5000 UT) TABS Take 5,000 Units by mouth daily.   cyanocobalamin  2000 MCG tablet Take 5,000 mcg by mouth daily.    diclofenac  Sodium (VOLTAREN ) 1 % GEL Apply 2-4 g topically 4 (four) times daily. APPLY 2-4 GRAMS TOPICALLY FOUR TIMES DAILY Strength: 1 %   doxepin  (SINEQUAN ) 100 MG capsule Take 1 capsule (100 mg total) by mouth at bedtime.   gabapentin  (NEURONTIN ) 300 MG capsule Take 300 mg by mouth 4 (four) times daily as needed (pain).   levocetirizine (XYZAL) 5 MG tablet SMARTSIG:1 Tablet(s) By Mouth Every Evening   lubiprostone  (AMITIZA ) 24 MCG capsule Take 1 capsule (24 mcg total) by mouth 2 (two) times daily with a meal.   magnesium  oxide (MAG-OX) 400 (240 Mg) MG tablet Take 400 mg by mouth every Monday, Wednesday, and Friday.   meloxicam  (MOBIC ) 15 MG tablet Take 15 mg by mouth daily.   metoprolol  tartrate (LOPRESSOR ) 25 MG tablet Take 0.5 tablets (12.5 mg total) by mouth daily as needed. If BP and HR is greater than 100 and for palpitations   mometasone  (ELOCON ) 0.1 % cream Apply topically daily as needed for itch   Potassium 99 MG TABS Take 99 mg by mouth every Monday, Wednesday, and Friday.   predniSONE  (DELTASONE ) 10 MG tablet For breathing or pain increase prednisone   to 10 mg x 2 until better then 1 daily x 5 days then one half   senna (SENOKOT) 8.6 MG tablet Take 5 tablets by mouth 2 (two) times daily.   No current facility-administered medications for this visit. (Other)   Facility-Administered Medications Ordered in Other Visits (Other)  Medication Route   regadenoson  (LEXISCAN ) injection SOLN 0.4 mg Intravenous   technetium tetrofosmin  (TC-MYOVIEW ) injection 31.4 millicurie Intravenous   REVIEW OF SYSTEMS:   ALLERGIES Allergies  Allergen Reactions   Codeine Other (See Comments)    Pains in abdominal area   Other     NOVACAINE     ITCHING Anti-depressants   Oxycodone      Abdominal pain, chest pain   Paxil [Paroxetine Hcl] Itching   Procaine Itching   PAST MEDICAL HISTORY Past Medical History:  Diagnosis Date   A-fib [facility])    pt went into A-fib during lung biopsies    Abdominal discomfort 11/02/2012   suspected ?UTI- PCP MD placed on Cipro    Anxiety    Arthritis    Asthma    Cancer (HCC)    skin CA   Choking due to phlegm    Chronic constipation    Depression    Dysrhythmia    tachycardia   Environmental allergies    Fibromyalgia    GERD (gastroesophageal reflux disease)    Hemorrhoids    Hiatal hernia    Hypercholesteremia    Hypertension    Sleep apnea    Past Surgical History:  Procedure Laterality Date   ANAL RECTAL MANOMETRY N/A 11/28/2013   Procedure: ANO RECTAL MANOMETRY;  Surgeon: Bernarda Ned, MD;  Location: [facility];  Service: Endoscopy;  Laterality: N/A;   APPENDECTOMY     BACK SURGERY     X2    BIOPSY  03/13/2021   Procedure: BIOPSY;  Surgeon: Golda Claudis PENNER, MD;  Location: [facility];  Service: Endoscopy;;   BRONCHIAL BIOPSY  09/02/2022   Procedure: BRONCHIAL BIOPSIES;  Surgeon: Brenna Adine CROME, DO;  Location: [facility];  Service: Pulmonary;;   BRONCHIAL BRUSHINGS  09/02/2022   Procedure: BRONCHIAL BRUSHINGS;  Surgeon: Brenna Adine CROME, DO;  Location: [facility];  Service: Pulmonary;;   BRONCHIAL NEEDLE ASPIRATION BIOPSY  09/02/2022   Procedure: BRONCHIAL NEEDLE ASPIRATION BIOPSIES;  Surgeon: Brenna Adine CROME, DO;  Location: [facility];  Service: Pulmonary;;   CHOLECYSTECTOMY     COLONOSCOPY  03/29/2003   COLONOSCOPY N/A 12/09/2013   Procedure: COLONOSCOPY;  Surgeon: Claudis PENNER Golda, MD;  [facility];  Service: Endoscopy;  Laterality: N/A;  730   COLONOSCOPY N/A 02/04/2017   Procedure: COLONOSCOPY;  Surgeon: Golda Claudis PENNER, MD;  [facility];  Service: Endoscopy;  Laterality: N/A;  930   ENDOBRONCHIAL ULTRASOUND Bilateral 09/02/2022   Procedure: ENDOBRONCHIAL ULTRASOUND;  Surgeon: Brenna Adine CROME, DO;  [facility];  Service: Pulmonary;  Laterality: Bilateral;   ESOPHAGEAL DILATION N/A 03/13/2021   Procedure: ESOPHAGEAL DILATION;  Surgeon: Golda Claudis PENNER, MD;   [facility];  Service: Endoscopy;  Laterality: N/A;   ESOPHAGOGASTRODUODENOSCOPY (EGD) WITH PROPOFOL  N/A 03/13/2021   Procedure: ESOPHAGOGASTRODUODENOSCOPY (EGD) WITH PROPOFOL ;  Surgeon: Golda Claudis PENNER, MD;  [facility];  Service: Endoscopy;  Laterality: N/A;  8:05   EYE SURGERY     bilateral cataract removal   INTERCOSTAL NERVE BLOCK Right 11/17/2022   Procedure: INTERCOSTAL NERVE BLOCK;  Surgeon: Shyrl Linnie KIDD, MD;  Location: [facility];  Service: Thoracic;  Laterality: Right;   IR KYPHO LUMBAR INC FX REDUCE BONE BX UNI/BIL CANNULATION INC/IMAGING  11/24/2019   IR KYPHO THORACIC WITH BONE BIOPSY  08/23/2019   IR KYPHO THORACIC WITH BONE BIOPSY  11/24/2019   IR RADIOLOGIST EVAL & MGMT  08/03/2019   IR RADIOLOGIST EVAL & MGMT  09/29/2019   IR RADIOLOGIST EVAL & MGMT  10/18/2019   IR RADIOLOGIST EVAL & MGMT  04/11/2020   NODE DISSECTION Right 11/17/2022   Procedure: NODE DISSECTION;  Surgeon: Shyrl Linnie KIDD, MD;  Location: [facility];  Service: Thoracic;  Laterality: Right;   SKIN CANCER EXCISION     RIGHT NECK     SPINE SURGERY     TONSILLECTOMY     T+A   TOTAL HIP ARTHROPLASTY Right 09/14/2012   Procedure: RIGHT TOTAL HIP ARTHROPLASTY ANTERIOR APPROACH and RIGHT KNEE STEROID INJECTION;  Surgeon: Lonni CINDERELLA Poli, MD;  Location: [facility];  Service: Orthopedics;  Laterality: Right;   TOTAL HIP ARTHROPLASTY Left 11/05/2012   Procedure: LEFT TOTAL HIP ARTHROPLASTY ANTERIOR APPROACH;  Surgeon: Lonni CINDERELLA Poli, MD;  Location: [facility];  Service: Orthopedics;  Laterality: Left;   UMBILICAL HERNIA REPAIR  04/26/2003   JENKINS   UPPER GASTROINTESTINAL ENDOSCOPY  08/03/2009   NUR   UPPER GASTROINTESTINAL ENDOSCOPY  03/29/2003   EGD ED TCS   FAMILY HISTORY Family History  Problem Relation Age of Onset   Healthy Son    Healthy Son    Healthy Daughter    Colon cancer Neg Hx    SOCIAL HISTORY Social History   Tobacco Use   Smoking status: Former     Current packs/day: 0.00    Average packs/day: 0.5 packs/day for 50.0 years (25.0 ttl pk-yrs)    Types: Cigarettes    Start date: 09/07/1970    Quit date: 09/06/2020    Years since quitting: 3.0   Smokeless tobacco: Never  Vaping Use   Vaping status: Never Used  Substance Use Topics   Alcohol  use: Yes    Comment: 1-2 drinks per week   Drug use: No       OPHTHALMIC EXAM:  Not recorded    IMAGING AND PROCEDURES  Imaging and Procedures for 09/30/2023           ASSESSMENT/PLAN: No diagnosis found.  1. 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, OD  - s/p IVA OD #1 (01.28.25), #2 (01.28.25), #3 (03.25.25) #4(06.18.2025)  - s/p IVV OD #1 (04.21.25 -- sample), #2 (05.21.25--sample), #3 (06.18.25--sample), #4 (07.18.25--sample)  - exam shows central CNV w/ +heme -- improved  - BCVA OD 20/150 - stable  - OCT shows stable resolution of central SRF, persistent IRF / cystic changes centrally and along ST arcades -- slightly improved, persistent central PED / [facility] -- slightly improved at 4 wks - recommend IVV OD sample #5 today, 09.03.25 w/ f/u extended to 6 wks - pt wishes to be treated with IVV - RBA of procedure discussed, questions answered - IVV informed consent obtained and signed, 04.23.25 - see procedure note  - f/u in 6 wks -- DFE/OCT, possible injection   2. Age related macular degeneration, non-exudative, OS  - The incidence, anatomy, and pathology of dry AMD, risk of progression, and the AREDS and AREDS 2 study including smoking risks discussed with patient.  - Recommend amsler grid monitoring  - monitor  3,4. Hypertensive retinopathy OU - discussed importance of tight BP control - monitor  5. Pseudophakia OU  - s/p CE/IOL (Dr. Alverta)  - IOL in good position, doing well  - monitor  Ophthalmic Meds Ordered this visit:  No orders of the defined types were placed in this encounter.    No follow-ups on file.  There are no Patient Instructions on file for this  visit.  This document serves as a record of  services personally performed by Redell JUDITHANN Hans, MD, PhD. It was created on their behalf by Almetta Pesa, an ophthalmic technician. The creation of this record is the provider's dictation and/or activities during the visit.    Electronically signed by: Almetta Pesa, OA, 09/25/23  10:26 AM   Redell JUDITHANN Hans, M.D., Ph.D. Diseases & Surgery of the Retina and Vitreous [facility] 08/14/2023    Abbreviations: M myopia (nearsighted); A astigmatism; H hyperopia (farsighted); P presbyopia; Mrx spectacle prescription;  CTL contact lenses; OD right eye; OS left eye; OU both eyes  XT exotropia; ET esotropia; PEK punctate epithelial keratitis; PEE punctate epithelial erosions; DES dry eye syndrome; MGD meibomian gland dysfunction; ATs artificial tears; PFAT's preservative free artificial tears; NSC nuclear sclerotic cataract; PSC posterior subcapsular cataract; ERM epi-retinal membrane; PVD posterior vitreous detachment; RD retinal detachment; DM diabetes mellitus; DR diabetic retinopathy; NPDR non-proliferative diabetic retinopathy; PDR proliferative diabetic retinopathy; CSME clinically significant macular edema; DME diabetic macular edema; dbh dot blot hemorrhages; CWS cotton wool spot; POAG primary open angle glaucoma; C/D cup-to-disc ratio; HVF humphrey visual field; GVF goldmann visual field; OCT optical coherence tomography; IOP intraocular pressure; BRVO Branch retinal vein occlusion; CRVO central retinal vein occlusion; CRAO central retinal artery occlusion; BRAO branch retinal artery occlusion; RT retinal tear; SB scleral buckle; PPV pars plana vitrectomy; VH Vitreous hemorrhage; PRP panretinal laser photocoagulation; IVK intravitreal kenalog ; VMT vitreomacular traction; MH Macular hole;  NVD neovascularization of the disc; NVE neovascularization elsewhere; AREDS age related eye disease study; ARMD age related macular  degeneration; POAG primary open angle glaucoma; EBMD epithelial/anterior basement membrane dystrophy; ACIOL anterior chamber intraocular lens; IOL intraocular lens; PCIOL posterior chamber intraocular lens; Phaco/IOL phacoemulsification with intraocular lens placement; PRK photorefractive keratectomy; LASIK laser assisted in situ keratomileusis; HTN hypertension; DM diabetes mellitus; COPD chronic obstructive pulmonary disease

## 2023-09-27 NOTE — Assessment & Plan Note (Addendum)
 Quit smoking Aug 2022 - onset of cough May 2024  - Rx cyclical cough regimen with tramadol , max gerd rx, off DPI's and on symbicort  80 2bid  02/23/23 > improved but did not resolve  - 08/24/2023 rec repeat cyclical cough rx s tramadol  this time and off all hfa x prn hfa which she insists works better than any thing else including neb hfa  - increased gabapentin  to 400 mg bid to help with back pain as well

## 2023-09-27 NOTE — Assessment & Plan Note (Addendum)
 Quit smoking 08/2020  with cxr 06/17/22 :   2.5 cm R mid lung likely RUL on lateral view - CT chest s contrast 07/30/2022 >>> RUL ant segment slt spiculated nodule> PET 08/14/22 POS RUL/ Hilar node uptake > referred to [facility] 08/25/2022  - CT chest 09/07/23 c contrast new R peribronchcial and subcarinal soft tissue thickening with RUL orifice narrowing and perhilar atx > reviewed with pt 09/21/2023  / f/u at [facility] planned   Each and every medication she brought with her was reviewed in detail including emphasizing most importantly the difference between maintenance and prns and under what circumstances the prns are to be triggered using an action plan format where appropriate.  Total time for H and P, chart review, counseling, reviewing hfa / neb device(s) and generating customized AVS unique to this office visit / same day charting = 

## 2023-09-27 NOTE — Assessment & Plan Note (Addendum)
 Quit smoking 2022 due to cough/ wheeze worse since then  - 06/17/2022  After extensive coaching inhaler device,  effectiveness =  75% with hfa so try change dpi to symb 80 2bid as main problem is cough / choking / globus sensation and rx max gerd x 6 week trial > 07/29/2022 lung clear 07/29/2022 and no more noct choking but still globus/ freq throat clearing so rec depomedrol 120/ rechallenge with symb 80 2bid  - PFTs  10/07/22  wnl p Trelegy prior. >>>> 02/23/2023  After extensive coaching inhaler device,  effectiveness =    75% with hfa > changed back to symbicort  80 2bid from trelegy for refractory cough p RT  >>>  04/06/2023 80% still coughing >  trial of breztri  2 bid  - 06/24/2023 change to alb/bud bid to qid and stop all other inhalers x for saba prn when out of house  - 09/21/2023  After extensive coaching inhaler device,  effectiveness =    90% and using saba bid so rec breyna  (symbicort )  160 Take 2 puffs first thing in am and then another 2 puffs about 12 hours later.  - Added Plan D to action plan:10 mg x 2 until better then 1 daily x 5 days then one half   PFTs normalize p saba so this is AB (not classic copd)  and laba/ics should do fine:   breyna  160 2bid plus new floor for prednisone  of 5 mg   Discussed in detail all the  indications, usual  risks and alternatives  relative to the benefits with patient who agrees to proceed with Rx as outlined.

## 2023-09-30 ENCOUNTER — Ambulatory Visit (INDEPENDENT_AMBULATORY_CARE_PROVIDER_SITE_OTHER): Admitting: Ophthalmology

## 2023-09-30 ENCOUNTER — Encounter (INDEPENDENT_AMBULATORY_CARE_PROVIDER_SITE_OTHER): Payer: Self-pay

## 2023-09-30 ENCOUNTER — Encounter (INDEPENDENT_AMBULATORY_CARE_PROVIDER_SITE_OTHER): Admitting: Ophthalmology

## 2023-09-30 ENCOUNTER — Encounter (INDEPENDENT_AMBULATORY_CARE_PROVIDER_SITE_OTHER): Payer: Self-pay | Admitting: Ophthalmology

## 2023-09-30 DIAGNOSIS — I10 Essential (primary) hypertension: Secondary | ICD-10-CM

## 2023-09-30 DIAGNOSIS — H35033 Hypertensive retinopathy, bilateral: Secondary | ICD-10-CM

## 2023-09-30 DIAGNOSIS — H353211 Exudative age-related macular degeneration, right eye, with active choroidal neovascularization: Secondary | ICD-10-CM

## 2023-09-30 DIAGNOSIS — H353122 Nonexudative age-related macular degeneration, left eye, intermediate dry stage: Secondary | ICD-10-CM

## 2023-09-30 DIAGNOSIS — Z961 Presence of intraocular lens: Secondary | ICD-10-CM

## 2023-09-30 MED ORDER — FARICIMAB-SVOA 6 MG/0.05ML IZ SOLN
6.0000 mg | INTRAVITREAL | Status: AC | PRN
  Administered 2023-09-30: 6 mg via INTRAVITREAL

## 2023-09-30 NOTE — Progress Notes (Signed)
 [facility] - Clinic Note  09/30/2023   CHIEF COMPLAINT Patient presents for Retina Follow Up  HISTORY OF PRESENT ILLNESS: Zoe [name] is a 88 y.o. female who presents to the clinic today for:  HPI     Retina Follow Up   Patient presents with  Wet AMD.  In right eye.  This started 6 weeks ago.  Duration of 6 weeks.  Since onset it is stable.        Comments   6 week retina follow up AMD and IVV OD pt is reporting her vision today is not as sharp but has not noticed any other changes       Last edited by Resa Delon ORN, COT on 09/30/2023  1:36 PM.     Pt states the right eye vision has been better than it is today. She feels some days are better than others. She had a CT scan and now has to have a PET scan.  Referring physician: Fleeta Zerita DASEN, MD [address] [place],  [place] [address]  HISTORICAL INFORMATION:  Selected notes from the MEDICAL RECORD NUMBER Referred by Dr. Fleeta for concern of exu ARMD OD LEE:  Ocular Hx- PMH-   CURRENT MEDICATIONS: Current Outpatient Medications (Ophthalmic Drugs)  Medication Sig   dorzolamide  (TRUSOPT ) 2 % ophthalmic solution Place 1 drop into the right eye daily.   Travoprost, BAK Free, (TRAVATAN) 0.004 % SOLN ophthalmic solution Place 1 drop into both eyes at bedtime.   No current facility-administered medications for this visit. (Ophthalmic Drugs)   Current Outpatient Medications (Other)  Medication Sig   albuterol  (PROVENTIL ) (2.5 MG/3ML) 0.083% nebulizer solution Take 3 mLs (2.5 mg total) by nebulization every 6 (six) hours as needed.   albuterol  (VENTOLIN  HFA) 108 (90 Base) MCG/ACT inhaler Inhale 2 puffs into the lungs every 6 (six) hours as needed for wheezing or shortness of breath.   allopurinol  (ZYLOPRIM ) 300 MG tablet Take 1 tablet (300 mg total) by mouth daily.   ALPRAZolam  (XANAX ) 0.5 MG tablet Take 0.5 mg by mouth 2 (two) times daily as needed for anxiety.   apixaban  (ELIQUIS ) 5 MG  TABS tablet TAKE 1 TABLET BY MOUTH TWICE A DAY   APPLE CIDER VINEGAR PO Take 625 mg by mouth daily.   Biotin  5000 MCG TABS Take 5,000-10,000 mcg by mouth daily.   budesonide -formoterol  (SYMBICORT ) 160-4.5 MCG/ACT inhaler Inhale 2 puffs into the lungs 2 (two) times daily as needed.   calcium-vitamin D  250-100 MG-UNIT tablet Take 1 tablet by mouth daily.   Cholecalciferol (VITAMIN D -3) 125 MCG (5000 UT) TABS Take 5,000 Units by mouth daily.   cyanocobalamin  2000 MCG tablet Take 5,000 mcg by mouth daily.   diclofenac  Sodium (VOLTAREN ) 1 % GEL Apply 2-4 g topically 4 (four) times daily. APPLY 2-4 GRAMS TOPICALLY FOUR TIMES DAILY Strength: 1 %   doxepin  (SINEQUAN ) 100 MG capsule Take 1 capsule (100 mg total) by mouth at bedtime.   gabapentin  (NEURONTIN ) 300 MG capsule Take 300 mg by mouth 4 (four) times daily as needed (pain).   levocetirizine (XYZAL) 5 MG tablet SMARTSIG:1 Tablet(s) By Mouth Every Evening   lubiprostone  (AMITIZA ) 24 MCG capsule Take 1 capsule (24 mcg total) by mouth 2 (two) times daily with a meal.   magnesium  oxide (MAG-OX) 400 (240 Mg) MG tablet Take 400 mg by mouth every Monday, Wednesday, and Friday.   meloxicam  (MOBIC ) 15 MG tablet Take 15 mg by mouth daily.   metoprolol  tartrate (  LOPRESSOR ) 25 MG tablet Take 0.5 tablets (12.5 mg total) by mouth daily as needed. If BP and HR is greater than 100 and for palpitations   mometasone  (ELOCON ) 0.1 % cream Apply topically daily as needed for itch   Potassium 99 MG TABS Take 99 mg by mouth every Monday, Wednesday, and Friday.   predniSONE  (DELTASONE ) 10 MG tablet For breathing or pain increase prednisone   to 10 mg x 2 until better then 1 daily x 5 days then one half   senna (SENOKOT) 8.6 MG tablet Take 5 tablets by mouth 2 (two) times daily.   No current facility-administered medications for this visit. (Other)   Facility-Administered Medications Ordered in Other Visits (Other)  Medication Route   regadenoson  (LEXISCAN ) injection  SOLN 0.4 mg Intravenous   technetium tetrofosmin  (TC-MYOVIEW ) injection 31.4 millicurie Intravenous   REVIEW OF SYSTEMS: ROS   Positive for: Eyes, Respiratory, Allergic/Imm Negative for: Constitutional, Gastrointestinal, Neurological, Skin, Genitourinary, Musculoskeletal, HENT, Endocrine, Cardiovascular, Psychiatric, Heme/Lymph Last edited by Resa Delon ORN, COT on 09/30/2023  1:36 PM.      ALLERGIES Allergies  Allergen Reactions   Codeine Other (See Comments)    Pains in abdominal area   Other     NOVACAINE     ITCHING Anti-depressants   Oxycodone      Abdominal pain, chest pain   Paxil [Paroxetine Hcl] Itching   Procaine Itching   PAST MEDICAL HISTORY Past Medical History:  Diagnosis Date   A-fib [facility])    pt went into A-fib during lung biopsies   Abdominal discomfort 11/02/2012   suspected ?UTI- PCP MD placed on Cipro    Anxiety    Arthritis    Asthma    Cancer (HCC)    skin CA   Choking due to phlegm    Chronic constipation    Depression    Dysrhythmia    tachycardia   Environmental allergies    Fibromyalgia    GERD (gastroesophageal reflux disease)    Hemorrhoids    Hiatal hernia    Hypercholesteremia    Hypertension    Sleep apnea    Past Surgical History:  Procedure Laterality Date   ANAL RECTAL MANOMETRY N/A 11/28/2013   Procedure: ANO RECTAL MANOMETRY;  Surgeon: Bernarda Ned, MD;  Location: [facility];  Service: Endoscopy;  Laterality: N/A;   APPENDECTOMY     BACK SURGERY     X2    BIOPSY  03/13/2021   Procedure: BIOPSY;  Surgeon: Golda Claudis PENNER, MD;  [facility]: [facility];  Service: Endoscopy;;   BRONCHIAL BIOPSY  09/02/2022   Procedure: BRONCHIAL BIOPSIES;  Surgeon: Brenna Adine CROME, DO;  Location: [facility];  Service: Pulmonary;;   BRONCHIAL BRUSHINGS  09/02/2022   Procedure: BRONCHIAL BRUSHINGS;  Surgeon: Brenna Adine CROME, DO;  [facility]: [facility];  Service: Pulmonary;;   BRONCHIAL NEEDLE ASPIRATION BIOPSY  09/02/2022    Procedure: BRONCHIAL NEEDLE ASPIRATION BIOPSIES;  Surgeon: Brenna Adine CROME, DO;  [facility]: [facility];  Service: Pulmonary;;   CHOLECYSTECTOMY     COLONOSCOPY  03/29/2003   COLONOSCOPY N/A 12/09/2013   Procedure: COLONOSCOPY;  Surgeon: Claudis PENNER Golda, MD;  [facility];  Service: Endoscopy;  Laterality: N/A;  730   COLONOSCOPY N/A 02/04/2017   Procedure: COLONOSCOPY;  Surgeon: Golda Claudis PENNER, MD;  [facility];  Service: Endoscopy;  Laterality: N/A;  930   ENDOBRONCHIAL ULTRASOUND Bilateral 09/02/2022   Procedure: ENDOBRONCHIAL ULTRASOUND;  Surgeon: Brenna Adine CROME, DO;  [facility];  Service: Pulmonary;  Laterality: Bilateral;   ESOPHAGEAL DILATION N/A 03/13/2021   Procedure: ESOPHAGEAL DILATION;  Surgeon: Golda Claudis PENNER, MD;  [facility];  Service: Endoscopy;  Laterality: N/A;   ESOPHAGOGASTRODUODENOSCOPY (EGD) WITH PROPOFOL  N/A 03/13/2021   Procedure: ESOPHAGOGASTRODUODENOSCOPY (EGD) WITH PROPOFOL ;  Surgeon: Golda Claudis PENNER, MD;  [facility];  Service: Endoscopy;  Laterality: N/A;  8:05   EYE SURGERY     bilateral cataract removal   INTERCOSTAL NERVE BLOCK Right 11/17/2022   Procedure: INTERCOSTAL NERVE BLOCK;  Surgeon: Shyrl Linnie KIDD, MD;  [facility];  Service: Thoracic;  Laterality: Right;   IR KYPHO LUMBAR INC FX REDUCE BONE BX UNI/BIL CANNULATION INC/IMAGING  11/24/2019   IR KYPHO THORACIC WITH BONE BIOPSY  08/23/2019   IR KYPHO THORACIC WITH BONE BIOPSY  11/24/2019   IR RADIOLOGIST EVAL & MGMT  08/03/2019   IR RADIOLOGIST EVAL & MGMT  09/29/2019   IR RADIOLOGIST EVAL & MGMT  10/18/2019   IR RADIOLOGIST EVAL & MGMT  04/11/2020   NODE DISSECTION Right 11/17/2022   Procedure: NODE DISSECTION;  Surgeon: Shyrl Linnie KIDD, MD;  [facility];  Service: Thoracic;  Laterality: Right;   SKIN CANCER EXCISION     RIGHT NECK     SPINE SURGERY     TONSILLECTOMY     T+A   TOTAL HIP ARTHROPLASTY Right  09/14/2012   Procedure: RIGHT TOTAL HIP ARTHROPLASTY ANTERIOR APPROACH and RIGHT KNEE STEROID INJECTION;  Surgeon: Lonni CINDERELLA Poli, MD;  [facility];  Service: Orthopedics;  Laterality: Right;   TOTAL HIP ARTHROPLASTY Left 11/05/2012   Procedure: LEFT TOTAL HIP ARTHROPLASTY ANTERIOR APPROACH;  Surgeon: Lonni CINDERELLA Poli, MD;  Location: [facility];  Service: Orthopedics;  Laterality: Left;   UMBILICAL HERNIA REPAIR  04/26/2003   JENKINS   UPPER GASTROINTESTINAL ENDOSCOPY  08/03/2009   NUR   UPPER GASTROINTESTINAL ENDOSCOPY  03/29/2003   EGD ED TCS   FAMILY HISTORY Family History  Problem Relation Age of Onset   Healthy Son    Healthy Son    Healthy Daughter    Colon cancer Neg Hx    SOCIAL HISTORY Social History   Tobacco Use   Smoking status: Former    Current packs/day: 0.00    Average packs/day: 0.5 packs/day for 50.0 years (25.0 ttl pk-yrs)    Types: Cigarettes    Start date: 09/07/1970    Quit date: 09/06/2020    Years since quitting: 3.0   Smokeless tobacco: Never  Vaping Use   Vaping status: Never Used  Substance Use Topics   Alcohol  use: Yes    Comment: 1-2 drinks per week   Drug use: No       OPHTHALMIC EXAM:  Base Eye Exam     Visual Acuity (Snellen - Linear)       Right Left   Dist [place] 20/400 20/30 -1   Dist ph [place] 20/200 -2 NI         Tonometry (Tonopen, 1:41 PM)       Right Left   Pressure 6 10         Pupils       Pupils Dark Light Shape React APD   Right PERRL 3 2 Round Brisk None   Left PERRL 3 2 Round Brisk None         Visual Fields       Left Right    Full Full  Extraocular Movement       Right Left    Full, Ortho Full, Ortho         Neuro/Psych     Oriented x3: Yes   Mood/Affect: Normal         Dilation     Both eyes: 2.5% Phenylephrine  @ 1:41 PM           Slit Lamp and Fundus Exam     Slit Lamp Exam       Right Left   Lids/Lashes Dermatochalasis - upper lid Dermatochalasis -  upper lid   Conjunctiva/Sclera White and quiet White and quiet   Cornea well healed cataract wound, trace PEE, mild tear film debris mild arcus, mild tear film debris, well healed cataract wound   Anterior Chamber deep and clear deep and clear   Iris Round and dilated Round and dilated   Lens 3 piece posterior chamber intraocular lens with mild temporal displacement 3 piece posterior chamber intraocular lens in good position   Anterior Vitreous mild syneresis mild syneresis         Fundus Exam       Right Left   Disc mild Pallor, Sharp rim, temporal PPA mild Pallor, Sharp rim, temporal PPA   C/D Ratio 0.3 0.3   Macula Blunted foveal reflex, central CNV with +heme and edema -- heme stably resolved, Drusen, RPE mottling, RPE rip with atrophy, central SRF stably improved Flat, Blunted foveal reflex, Drusen, RPE mottling and clumping, No heme or edema   Vessels attenuated, Tortuous attenuated, Tortuous   Periphery Attached, pigmented paving stone degeneration inferiorly, No heme Attached, reticular degeneration, No heme           IMAGING AND PROCEDURES  Imaging and Procedures for 09/30/2023  OCT, Retina - OU - Both Eyes       Right Eye Quality was good. Central Foveal Thickness: 350. Progression has improved. Findings include no SRF, abnormal foveal contour, retinal drusen , subretinal hyper-reflective material, choroidal neovascular membrane, intraretinal fluid, pigment epithelial detachment (stable resolution of central SRF, persistent IRF / cystic changes centrally and along ST arcades -- slightly improved, persistent central PED / [facility] -- slightly improved -- RPE rip).   Left Eye Quality was good. Central Foveal Thickness: 242. Progression has been stable. Findings include normal foveal contour, no IRF, no SRF, retinal drusen , pigment epithelial detachment (Mild patchy ORA).   Notes *Images captured and stored on drive  Diagnosis / Impression:  OD: stable resolution of central  SRF, persistent IRF / cystic changes centrally and along ST arcades -- slightly improved, persistent central PED / SRHM -- slightly improved OS: nonexudative ARMD w/ patchy ORA  Clinical management:  See below  Abbreviations: NFP - Normal foveal profile. CME - cystoid macular edema. PED - pigment epithelial detachment. IRF - intraretinal fluid. SRF - subretinal fluid. EZ - ellipsoid zone. ERM - epiretinal membrane. ORA - outer retinal atrophy. ORT - outer retinal tubulation. SRHM - subretinal hyper-reflective material. IRHM - intraretinal hyper-reflective material              ASSESSMENT/PLAN:   ICD-10-CM   1. Exudative age-related macular degeneration of right eye with active choroidal neovascularization (HCC)  H35.3211 OCT, Retina - OU - Both Eyes    2. 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 Essential hypertension  I10     4. Hypertensive retinopathy of both eyes  H35.033     5. Pseudophakia,  both eyes  Z96.1       1. 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, OD - s/p IVA OD #1 (01.28.25), #2 (01.28.25), #3 (03.25.25) #4(06.18.2025) ====================== - s/p IVV OD #1 (04.21.25 -- sample), #2 (05.21.25--sample), #3 (06.18.25--sample), #4 (07.18.25--sample)  - exam shows central CNV w/ +heme -- improved  - BCVA OD 20/200 - OCT shows Interval redevelopment of central SRF, persistent IRF / cystic changes centrally and along ST arcades -- slightly increased, persistent central PED / [facility] -- slightly increased -- RPE rip at 4-6+wks - recommend IVV OD sample #5 today, 09.03.25 w/ f/u in 4-5 wks - pt wishes to be treated with IVV - RBA of procedure discussed, questions answered - IVV informed consent obtained and signed, 04.23.25 - see procedure note  - f/u in 4-5 wks -- DFE/OCT, possible injection   2. Age related macular degeneration, non-exudative, OS - The incidence, anatomy, and pathology of dry AMD, risk of  progression, and the AREDS and AREDS 2 study including smoking risks discussed with patient.  - Recommend amsler grid monitoring  - monitor  3,4. Hypertensive retinopathy OU - discussed importance of tight BP control - monitor  5. Pseudophakia OU  - s/p CE/IOL (Dr. Alverta)  - IOL in good position, doing well  - monitor  Ophthalmic Meds Ordered this visit:  No orders of the defined types were placed in this encounter.    No follow-ups on file.  There are no Patient Instructions on file for this visit.  This document serves as a record of services personally performed by Redell JUDITHANN Hans, MD, PhD. It was created on their behalf by Almetta Pesa, an ophthalmic technician. The creation of this record is the provider's dictation and/or activities during the visit.    Electronically signed by: Almetta Pesa, OA, 09/30/23  2:08 PM  This document serves as a record of services personally performed by Redell JUDITHANN Hans, MD, PhD. It was created on their behalf by Wanda GEANNIE Keens, COT an ophthalmic technician. The creation of this record is the provider's dictation and/or activities during the visit.    Electronically signed by:  Wanda GEANNIE Keens, COT  09/30/23 2:11 PM  Redell JUDITHANN Hans, M.D., Ph.D. Diseases & Surgery of the Retina and Vitreous [facility] 08/14/2023    Abbreviations: M myopia (nearsighted); A astigmatism; H hyperopia (farsighted); P presbyopia; Mrx spectacle prescription;  CTL contact lenses; OD right eye; OS left eye; OU both eyes  XT exotropia; ET esotropia; PEK punctate epithelial keratitis; PEE punctate epithelial erosions; DES dry eye syndrome; MGD meibomian gland dysfunction; ATs artificial tears; PFAT's preservative free artificial tears; NSC nuclear sclerotic cataract; PSC posterior subcapsular cataract; ERM epi-retinal membrane; PVD posterior vitreous detachment; RD retinal detachment; DM diabetes mellitus; DR diabetic retinopathy;  NPDR non-proliferative diabetic retinopathy; PDR proliferative diabetic retinopathy; CSME clinically significant macular edema; DME diabetic macular edema; dbh dot blot hemorrhages; CWS cotton wool spot; POAG primary open angle glaucoma; C/D cup-to-disc ratio; HVF humphrey visual field; GVF goldmann visual field; OCT optical coherence tomography; IOP intraocular pressure; BRVO Branch retinal vein occlusion; CRVO central retinal vein occlusion; CRAO central retinal artery occlusion; BRAO branch retinal artery occlusion; RT retinal tear; SB scleral buckle; PPV pars plana vitrectomy; VH Vitreous hemorrhage; PRP panretinal laser photocoagulation; IVK intravitreal kenalog ; VMT vitreomacular traction; MH Macular hole;  NVD neovascularization of the disc; NVE neovascularization elsewhere; AREDS age related eye disease study; ARMD age related macular degeneration; POAG primary open angle glaucoma; EBMD epithelial/anterior basement  membrane dystrophy; ACIOL anterior chamber intraocular lens; IOL intraocular lens; PCIOL posterior chamber intraocular lens; Phaco/IOL phacoemulsification with intraocular lens placement; PRK photorefractive keratectomy; LASIK laser assisted in situ keratomileusis; HTN hypertension; DM diabetes mellitus; COPD chronic obstructive pulmonary disease

## 2023-10-05 DIAGNOSIS — C3491 Malignant neoplasm of unspecified part of right bronchus or lung: Secondary | ICD-10-CM | POA: Diagnosis not present

## 2023-10-05 DIAGNOSIS — C770 Secondary and unspecified malignant neoplasm of lymph nodes of head, face and neck: Secondary | ICD-10-CM | POA: Diagnosis not present

## 2023-10-05 DIAGNOSIS — C771 Secondary and unspecified malignant neoplasm of intrathoracic lymph nodes: Secondary | ICD-10-CM | POA: Diagnosis not present

## 2023-10-05 DIAGNOSIS — R918 Other nonspecific abnormal finding of lung field: Secondary | ICD-10-CM | POA: Diagnosis not present

## 2023-10-05 DIAGNOSIS — C3411 Malignant neoplasm of upper lobe, right bronchus or lung: Secondary | ICD-10-CM | POA: Diagnosis not present

## 2023-10-06 ENCOUNTER — Other Ambulatory Visit (INDEPENDENT_AMBULATORY_CARE_PROVIDER_SITE_OTHER): Payer: Self-pay | Admitting: Gastroenterology

## 2023-10-06 DIAGNOSIS — K219 Gastro-esophageal reflux disease without esophagitis: Secondary | ICD-10-CM

## 2023-10-06 DIAGNOSIS — R1319 Other dysphagia: Secondary | ICD-10-CM

## 2023-10-07 ENCOUNTER — Telehealth: Payer: Self-pay | Admitting: Gastroenterology

## 2023-10-07 NOTE — Telephone Encounter (Signed)
 Note sent to Dr. Eartha to fill.

## 2023-10-07 NOTE — Telephone Encounter (Signed)
 Per 12/22/2022 Ov patient was to continue 20 mg daily. No longer on Med list and this script for 40 mg was discontinued by CMA on 04/06/2023. Need to call patient to find out if she is taking any form of Omeprazole .

## 2023-10-07 NOTE — Telephone Encounter (Signed)
 I spoke with the patient spouse and I asked that he please have the patient to return call to the office.

## 2023-10-07 NOTE — Telephone Encounter (Signed)
 spoke with the patient and she says she is taking this medication Omeprazole  40 mg once per day. She would like a 90 days supply of this sent to [facility]. Thanks

## 2023-10-07 NOTE — Telephone Encounter (Signed)
 Patient returning call. [phone]

## 2023-10-07 NOTE — Telephone Encounter (Signed)
 I spoke with the patient and she says she is taking this medication Omeprazole  40 mg once per day. She would like a 90 days supply of this sent to [facility]. Thanks

## 2023-10-08 DIAGNOSIS — R911 Solitary pulmonary nodule: Secondary | ICD-10-CM | POA: Diagnosis not present

## 2023-10-08 DIAGNOSIS — D508 Other iron deficiency anemias: Secondary | ICD-10-CM | POA: Diagnosis not present

## 2023-10-08 DIAGNOSIS — C3490 Malignant neoplasm of unspecified part of unspecified bronchus or lung: Secondary | ICD-10-CM | POA: Diagnosis not present

## 2023-10-08 DIAGNOSIS — R790 Abnormal level of blood mineral: Secondary | ICD-10-CM | POA: Diagnosis not present

## 2023-10-08 DIAGNOSIS — C3491 Malignant neoplasm of unspecified part of right bronchus or lung: Secondary | ICD-10-CM | POA: Diagnosis not present

## 2023-10-13 DIAGNOSIS — K644 Residual hemorrhoidal skin tags: Secondary | ICD-10-CM | POA: Diagnosis not present

## 2023-10-13 DIAGNOSIS — Z299 Encounter for prophylactic measures, unspecified: Secondary | ICD-10-CM | POA: Diagnosis not present

## 2023-10-13 DIAGNOSIS — R52 Pain, unspecified: Secondary | ICD-10-CM | POA: Diagnosis not present

## 2023-10-13 DIAGNOSIS — I10 Essential (primary) hypertension: Secondary | ICD-10-CM | POA: Diagnosis not present

## 2023-10-16 DIAGNOSIS — R9082 White matter disease, unspecified: Secondary | ICD-10-CM | POA: Diagnosis not present

## 2023-10-16 DIAGNOSIS — R911 Solitary pulmonary nodule: Secondary | ICD-10-CM | POA: Diagnosis not present

## 2023-10-16 DIAGNOSIS — C3491 Malignant neoplasm of unspecified part of right bronchus or lung: Secondary | ICD-10-CM | POA: Diagnosis not present

## 2023-10-16 DIAGNOSIS — C3411 Malignant neoplasm of upper lobe, right bronchus or lung: Secondary | ICD-10-CM | POA: Diagnosis not present

## 2023-10-22 DIAGNOSIS — R911 Solitary pulmonary nodule: Secondary | ICD-10-CM | POA: Diagnosis not present

## 2023-10-22 DIAGNOSIS — C3491 Malignant neoplasm of unspecified part of right bronchus or lung: Secondary | ICD-10-CM | POA: Diagnosis not present

## 2023-10-22 DIAGNOSIS — C3490 Malignant neoplasm of unspecified part of unspecified bronchus or lung: Secondary | ICD-10-CM | POA: Diagnosis not present

## 2023-10-27 NOTE — Progress Notes (Signed)
 [facility] - Clinic Note  11/04/2023   CHIEF COMPLAINT Patient presents for Retina Follow Up  HISTORY OF PRESENT ILLNESS: Zoe [name] is a 88 y.o. female who presents to the clinic today for:  HPI     Retina Follow Up   Patient presents with  Wet AMD.  In right eye.  This started 5 weeks ago.  I, the attending physician,  performed the HPI with the patient and updated documentation appropriately.        Comments   Patient here for 5 weeks retina follow up for exu ARMD OD. Patient states vision doing ok. Has pain across above OD last 2-3 weeks. Intermitting there and goes away.       Last edited by Valdemar Rogue, MD on 11/04/2023  5:18 PM.     Referring physician: Fleeta Zerita DASEN, MD [address] [place],  [place] [address]  HISTORICAL INFORMATION:  Selected notes from the MEDICAL RECORD NUMBER Referred by Dr. Fleeta for concern of exu ARMD OD LEE:  Ocular Hx- PMH-   CURRENT MEDICATIONS: Current Outpatient Medications (Ophthalmic Drugs)  Medication Sig   dorzolamide  (TRUSOPT ) 2 % ophthalmic solution Place 1 drop into the right eye daily.   Travoprost, BAK Free, (TRAVATAN) 0.004 % SOLN ophthalmic solution Place 1 drop into both eyes at bedtime.   No current facility-administered medications for this visit. (Ophthalmic Drugs)   Current Outpatient Medications (Other)  Medication Sig   albuterol  (PROVENTIL ) (2.5 MG/3ML) 0.083% nebulizer solution Take 3 mLs (2.5 mg total) by nebulization every 6 (six) hours as needed.   albuterol  (VENTOLIN  HFA) 108 (90 Base) MCG/ACT inhaler Inhale 2 puffs into the lungs every 6 (six) hours as needed for wheezing or shortness of breath.   allopurinol  (ZYLOPRIM ) 300 MG tablet Take 1 tablet (300 mg total) by mouth daily.   ALPRAZolam  (XANAX ) 0.5 MG tablet Take 0.5 mg by mouth 2 (two) times daily as needed for anxiety.   apixaban  (ELIQUIS ) 5 MG TABS tablet TAKE 1 TABLET BY MOUTH TWICE A DAY   APPLE CIDER VINEGAR PO  Take 625 mg by mouth daily.   Biotin  5000 MCG TABS Take 5,000-10,000 mcg by mouth daily.   budesonide -formoterol  (SYMBICORT ) 160-4.5 MCG/ACT inhaler Inhale 2 puffs into the lungs 2 (two) times daily as needed.   calcium-vitamin D  250-100 MG-UNIT tablet Take 1 tablet by mouth daily.   Cholecalciferol (VITAMIN D -3) 125 MCG (5000 UT) TABS Take 5,000 Units by mouth daily.   cyanocobalamin  2000 MCG tablet Take 5,000 mcg by mouth daily.   diclofenac  Sodium (VOLTAREN ) 1 % GEL Apply 2-4 g topically 4 (four) times daily. APPLY 2-4 GRAMS TOPICALLY FOUR TIMES DAILY Strength: 1 %   doxepin  (SINEQUAN ) 100 MG capsule Take 1 capsule (100 mg total) by mouth at bedtime.   gabapentin  (NEURONTIN ) 300 MG capsule Take 300 mg by mouth 4 (four) times daily as needed (pain).   levocetirizine (XYZAL) 5 MG tablet SMARTSIG:1 Tablet(s) By Mouth Every Evening   lubiprostone  (AMITIZA ) 24 MCG capsule Take 1 capsule (24 mcg total) by mouth 2 (two) times daily with a meal.   magnesium  oxide (MAG-OX) 400 (240 Mg) MG tablet Take 400 mg by mouth every Monday, Wednesday, and Friday.   meloxicam  (MOBIC ) 15 MG tablet Take 15 mg by mouth daily.   metoprolol  tartrate (LOPRESSOR ) 25 MG tablet Take 0.5 tablets (12.5 mg total) by mouth daily as needed. If BP and HR is greater than 100 and for palpitations  mometasone  (ELOCON ) 0.1 % cream Apply topically daily as needed for itch   omeprazole  (PRILOSEC) 40 MG capsule Take 1 capsule by mouth once daily   Potassium 99 MG TABS Take 99 mg by mouth every Monday, Wednesday, and Friday.   predniSONE  (DELTASONE ) 10 MG tablet For breathing or pain increase prednisone   to 10 mg x 2 until better then 1 daily x 5 days then one half   senna (SENOKOT) 8.6 MG tablet Take 5 tablets by mouth 2 (two) times daily.   No current facility-administered medications for this visit. (Other)   Facility-Administered Medications Ordered in Other Visits (Other)  Medication Route   regadenoson  (LEXISCAN ) injection  SOLN 0.4 mg Intravenous   technetium tetrofosmin  (TC-MYOVIEW ) injection 31.4 millicurie Intravenous   REVIEW OF SYSTEMS: ROS   Positive for: Eyes, Respiratory, Allergic/Imm Negative for: Constitutional, Gastrointestinal, Neurological, Skin, Genitourinary, Musculoskeletal, HENT, Endocrine, Cardiovascular, Psychiatric, Heme/Lymph Last edited by Orval Asberry RAMAN, COA on 11/04/2023  1:19 PM.     ALLERGIES Allergies  Allergen Reactions   Codeine Other (See Comments)    Pains in abdominal area   Other     NOVACAINE     ITCHING Anti-depressants   Oxycodone      Abdominal pain, chest pain   Paxil [Paroxetine Hcl] Itching   Procaine Itching   PAST MEDICAL HISTORY Past Medical History:  Diagnosis Date   A-fib [facility])    pt went into A-fib during lung biopsies   Abdominal discomfort 11/02/2012   suspected ?UTI- PCP MD placed on Cipro    Anxiety    Arthritis    Asthma    Cancer (HCC)    skin CA   Choking due to phlegm    Chronic constipation    Depression    Dysrhythmia    tachycardia   Environmental allergies    Fibromyalgia    GERD (gastroesophageal reflux disease)    Hemorrhoids    Hiatal hernia    Hypercholesteremia    Hypertension    Sleep apnea    Past Surgical History:  Procedure Laterality Date   ANAL RECTAL MANOMETRY N/A 11/28/2013   Procedure: ANO RECTAL MANOMETRY;  Surgeon: Bernarda Ned, MD;  Location: [facility];  Service: Endoscopy;  Laterality: N/A;   APPENDECTOMY     BACK SURGERY     X2    BIOPSY  03/13/2021   Procedure: BIOPSY;  Surgeon: Golda Claudis PENNER, MD;  [facility]: [facility];  Service: Endoscopy;;   BRONCHIAL BIOPSY  09/02/2022   Procedure: BRONCHIAL BIOPSIES;  Surgeon: Brenna Adine CROME, DO;  Location: [facility];  Service: Pulmonary;;   BRONCHIAL BRUSHINGS  09/02/2022   Procedure: BRONCHIAL BRUSHINGS;  Surgeon: Brenna Adine CROME, DO;  [facility]: [facility];  Service: Pulmonary;;   BRONCHIAL NEEDLE ASPIRATION BIOPSY  09/02/2022   Procedure:  BRONCHIAL NEEDLE ASPIRATION BIOPSIES;  Surgeon: Brenna Adine CROME, DO;  [facility]: [facility];  Service: Pulmonary;;   CHOLECYSTECTOMY     COLONOSCOPY  03/29/2003   COLONOSCOPY N/A 12/09/2013   Procedure: COLONOSCOPY;  Surgeon: Claudis PENNER Golda, MD;  [facility];  Service: Endoscopy;  Laterality: N/A;  730   COLONOSCOPY N/A 02/04/2017   Procedure: COLONOSCOPY;  Surgeon: Golda Claudis PENNER, MD;  [facility];  Service: Endoscopy;  Laterality: N/A;  930   ENDOBRONCHIAL ULTRASOUND Bilateral 09/02/2022   Procedure: ENDOBRONCHIAL ULTRASOUND;  Surgeon: Brenna Adine CROME, DO;  [facility];  Service: Pulmonary;  Laterality: Bilateral;   ESOPHAGEAL DILATION N/A 03/13/2021   Procedure: ESOPHAGEAL  DILATION;  Surgeon: Golda Claudis PENNER, MD;  [facility];  Service: Endoscopy;  Laterality: N/A;   ESOPHAGOGASTRODUODENOSCOPY (EGD) WITH PROPOFOL  N/A 03/13/2021   Procedure: ESOPHAGOGASTRODUODENOSCOPY (EGD) WITH PROPOFOL ;  Surgeon: Golda Claudis PENNER, MD;  [facility];  Service: Endoscopy;  Laterality: N/A;  8:05   EYE SURGERY     bilateral cataract removal   INTERCOSTAL NERVE BLOCK Right 11/17/2022   Procedure: INTERCOSTAL NERVE BLOCK;  Surgeon: Shyrl Linnie KIDD, MD;  [facility]: [facility];  Service: Thoracic;  Laterality: Right;   IR KYPHO LUMBAR INC FX REDUCE BONE BX UNI/BIL CANNULATION INC/IMAGING  11/24/2019   IR KYPHO THORACIC WITH BONE BIOPSY  08/23/2019   IR KYPHO THORACIC WITH BONE BIOPSY  11/24/2019   IR RADIOLOGIST EVAL & MGMT  08/03/2019   IR RADIOLOGIST EVAL & MGMT  09/29/2019   IR RADIOLOGIST EVAL & MGMT  10/18/2019   IR RADIOLOGIST EVAL & MGMT  04/11/2020   NODE DISSECTION Right 11/17/2022   Procedure: NODE DISSECTION;  Surgeon: Shyrl Linnie KIDD, MD;  [facility]: [facility];  Service: Thoracic;  Laterality: Right;   SKIN CANCER EXCISION     RIGHT NECK     SPINE SURGERY     TONSILLECTOMY     T+A   TOTAL HIP ARTHROPLASTY Right 09/14/2012    Procedure: RIGHT TOTAL HIP ARTHROPLASTY ANTERIOR APPROACH and RIGHT KNEE STEROID INJECTION;  Surgeon: Lonni CINDERELLA Poli, MD;  [facility];  Service: Orthopedics;  Laterality: Right;   TOTAL HIP ARTHROPLASTY Left 11/05/2012   Procedure: LEFT TOTAL HIP ARTHROPLASTY ANTERIOR APPROACH;  Surgeon: Lonni CINDERELLA Poli, MD;  Location: [facility];  Service: Orthopedics;  Laterality: Left;   UMBILICAL HERNIA REPAIR  04/26/2003   JENKINS   UPPER GASTROINTESTINAL ENDOSCOPY  08/03/2009   NUR   UPPER GASTROINTESTINAL ENDOSCOPY  03/29/2003   EGD ED TCS   FAMILY HISTORY Family History  Problem Relation Age of Onset   Healthy Son    Healthy Son    Healthy Daughter    Colon cancer Neg Hx    SOCIAL HISTORY Social History   Tobacco Use   Smoking status: Former    Current packs/day: 0.00    Average packs/day: 0.5 packs/day for 50.0 years (25.0 ttl pk-yrs)    Types: Cigarettes    Start date: 09/07/1970    Quit date: 09/06/2020    Years since quitting: 3.1   Smokeless tobacco: Never  Vaping Use   Vaping status: Never Used  Substance Use Topics   Alcohol  use: Yes    Comment: 1-2 drinks per week   Drug use: No       OPHTHALMIC EXAM:  Base Eye Exam     Visual Acuity (Snellen - Linear)       Right Left   Dist [place] 20/150 20/25 -2   Dist ph  20/60 -1          Tonometry (Tonopen, 1:17 PM)       Right Left   Pressure 12 12         Pupils       Dark Light Shape React APD   Right 3 2 Round Brisk None   Left 3 2 Round Brisk None         Visual Fields (Counting fingers)       Left Right    Full Full         Extraocular Movement       Right Left    Full, Ortho  Full, Ortho         Neuro/Psych     Oriented x3: Yes   Mood/Affect: Normal         Dilation     Both eyes: 1.0% Mydriacyl, 2.5% Phenylephrine  @ 1:17 PM           Slit Lamp and Fundus Exam     Slit Lamp Exam       Right Left   Lids/Lashes Dermatochalasis - upper lid Dermatochalasis -  upper lid   Conjunctiva/Sclera White and quiet White and quiet   Cornea well healed cataract wound, trace PEE, mild tear film debris mild arcus, mild tear film debris, well healed cataract wound   Anterior Chamber deep and clear deep and clear   Iris Round and dilated Round and dilated   Lens 3 piece posterior chamber intraocular lens with mild temporal displacement 3 piece posterior chamber intraocular lens in good position   Anterior Vitreous mild syneresis mild syneresis         Fundus Exam       Right Left   Disc mild Pallor, Sharp rim, temporal PPA mild Pallor, Sharp rim, temporal PPA   C/D Ratio 0.3 0.3   Macula Blunted foveal reflex, central CNV with +heme and edema -- heme stably resolved and edema improved, Drusen, RPE mottling, RPE rip with atrophy, central SRF improved Flat, Blunted foveal reflex, Drusen, RPE mottling and clumping, No heme or edema   Vessels attenuated, Tortuous attenuated, Tortuous   Periphery Attached, pigmented paving stone degeneration inferiorly, No heme Attached, reticular degeneration, No heme           IMAGING AND PROCEDURES  Imaging and Procedures for 11/04/2023  OCT, Retina - OU - Both Eyes       Right Eye Quality was good. Central Foveal Thickness: 344. Progression has improved. Findings include no IRF, no SRF, abnormal foveal contour, retinal drusen , subretinal hyper-reflective material, choroidal neovascular membrane, intraretinal fluid, pigment epithelial detachment (Interval improvement in central SRF, IRF / cystic changes, persistent central PED / [facility] -- improved -- RPE rip).   Left Eye Quality was good. Central Foveal Thickness: 229. Progression has been stable. Findings include normal foveal contour, no IRF, no SRF, retinal drusen , pigment epithelial detachment (Mild patchy ORA).   Notes *Images captured and stored on drive  Diagnosis / Impression:  OD: Interval improvement in central SRF, IRF / cystic changes; persistent  central PED / [facility] -- improved -- RPE rip OS: nonexudative ARMD w/ patchy ORA  Clinical management:  See below  Abbreviations: NFP - Normal foveal profile. CME - cystoid macular edema. PED - pigment epithelial detachment. IRF - intraretinal fluid. SRF - subretinal fluid. EZ - ellipsoid zone. ERM - epiretinal membrane. ORA - outer retinal atrophy. ORT - outer retinal tubulation. SRHM - subretinal hyper-reflective material. IRHM - intraretinal hyper-reflective material      Intravitreal Injection, Pharmacologic Agent - OD - Right Eye       Time Out 11/04/2023. 1:42 PM. Confirmed correct patient, procedure, site, and patient consented.   Anesthesia Topical anesthesia was used. Anesthetic medications included Lidocaine  2%, Proparacaine 0.5%.   Procedure Preparation included 5% betadine to ocular surface, eyelid speculum. A (32g) needle was used.   Injection: 6 mg faricimab -svoa 6 MG/0.05ML (Patient supplied)   Route: Intravitreal, Site: Right Eye   NDC: 49757-903-98, Lot: [id number], Expiration date: 10/26/2025, Waste: 0 mL   Post-op Post injection exam found visual acuity of at least counting fingers. The patient  tolerated the procedure well. There were no complications. The patient received written and verbal post procedure care education.   Notes **SAMPLE MEDICATION ADMINISTERED**          ASSESSMENT/PLAN:   ICD-10-CM   1. Exudative age-related macular degeneration of right eye with active choroidal neovascularization (HCC)  H35.3211 OCT, Retina - OU - Both Eyes    Intravitreal Injection, Pharmacologic Agent - OD - Right Eye    faricimab -svoa (VABYSMO ) 6mg /0.48mL intravitreal injection    2. 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 Essential hypertension  I10     4. Hypertensive retinopathy of both eyes  H35.033     5. Pseudophakia, both eyes  Z96.1      1. 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, OD - s/p IVA OD  #1 (01.28.25), #2 (01.28.25), #3 (03.25.25), #4 (06.18.2025) -- IVA resistance ====================== - s/p IVV OD #1 (04.21.25 -- sample), #2 (05.21.25--sample), #3 (06.18.25 sample), #4 (07.18.25 sample), #5 (09.09.25 sample)  - exam shows central CNV w/ +heme -- improved  - BCVA OD 20/60 from 20/200 - OCT shows Interval improvement in central SRF, IRF / cystic changes, persistent central PED / [facility] -- improved -- RPE rip at 5 wks - recommend IVV OD sample #6 today, 10.08.25 w/ f/u in 5 wks - pt wishes to be treated with IVV - RBA of procedure discussed, questions answered - IVV informed consent obtained and signed, 04.23.25 - see procedure note  - f/u in 5 wks -- DFE/OCT, possible injection   2. Age related macular degeneration, non-exudative, OS - The incidence, anatomy, and pathology of dry AMD, risk of progression, and the AREDS and AREDS 2 study including smoking risks discussed with patient.  - Recommend amsler grid monitoring  - monitor  3,4. Hypertensive retinopathy OU - discussed importance of tight BP control - monitor  5. Pseudophakia OU  - s/p CE/IOL (Dr. Alverta)  - IOL in good position, doing well  - monitor  Ophthalmic Meds Ordered this visit:  Meds ordered this encounter  Medications   faricimab -svoa (VABYSMO ) 6mg /0.18mL intravitreal injection     Return in about 5 weeks (around 12/09/2023) for f/u, Ex. AMD, DFE, OCT, Possible, IVV, OD.  There are no Patient Instructions on file for this visit.  This document serves as a record of services personally performed by Redell JUDITHANN Hans, MD, PhD. It was created on their behalf by Almetta Pesa, an ophthalmic technician. The creation of this record is the provider's dictation and/or activities during the visit.    Electronically signed by: Almetta Pesa, OA, 11/08/23  3:47 PM  This document serves as a record of services personally performed by Redell JUDITHANN Hans, MD, PhD. It was created on their behalf by Wanda GEANNIE Keens, COT an ophthalmic technician. The creation of this record is the provider's dictation and/or activities during the visit.    Electronically signed by:  Wanda GEANNIE Keens, COT  11/08/23 3:47 PM  Redell JUDITHANN Hans, M.D., Ph.D. Diseases & Surgery of the Retina and Vitreous [facility]  I have reviewed the above documentation for accuracy and completeness, and I agree with the above. Redell JUDITHANN Hans, M.D., Ph.D. 11/08/23 3:50 PM   Abbreviations: M myopia (nearsighted); A astigmatism; H hyperopia (farsighted); P presbyopia; Mrx spectacle prescription;  CTL contact lenses; OD right eye; OS left eye; OU both eyes  XT exotropia; ET esotropia; PEK punctate epithelial keratitis; PEE punctate epithelial erosions;  DES dry eye syndrome; MGD meibomian gland dysfunction; ATs artificial tears; PFAT's preservative free artificial tears; NSC nuclear sclerotic cataract; PSC posterior subcapsular cataract; ERM epi-retinal membrane; PVD posterior vitreous detachment; RD retinal detachment; DM diabetes mellitus; DR diabetic retinopathy; NPDR non-proliferative diabetic retinopathy; PDR proliferative diabetic retinopathy; CSME clinically significant macular edema; DME diabetic macular edema; dbh dot blot hemorrhages; CWS cotton wool spot; POAG primary open angle glaucoma; C/D cup-to-disc ratio; HVF humphrey visual field; GVF goldmann visual field; OCT optical coherence tomography; IOP intraocular pressure; BRVO Branch retinal vein occlusion; CRVO central retinal vein occlusion; CRAO central retinal artery occlusion; BRAO branch retinal artery occlusion; RT retinal tear; SB scleral buckle; PPV pars plana vitrectomy; VH Vitreous hemorrhage; PRP panretinal laser photocoagulation; IVK intravitreal kenalog ; VMT vitreomacular traction; MH Macular hole;  NVD neovascularization of the disc; NVE neovascularization elsewhere; AREDS age related eye disease study; ARMD age related macular degeneration; POAG  primary open angle glaucoma; EBMD epithelial/anterior basement membrane dystrophy; ACIOL anterior chamber intraocular lens; IOL intraocular lens; PCIOL posterior chamber intraocular lens; Phaco/IOL phacoemulsification with intraocular lens placement; PRK photorefractive keratectomy; LASIK laser assisted in situ keratomileusis; HTN hypertension; DM diabetes mellitus; COPD chronic obstructive pulmonary disease

## 2023-10-29 DIAGNOSIS — D508 Other iron deficiency anemias: Secondary | ICD-10-CM | POA: Diagnosis not present

## 2023-10-29 DIAGNOSIS — C3491 Malignant neoplasm of unspecified part of right bronchus or lung: Secondary | ICD-10-CM | POA: Diagnosis not present

## 2023-10-29 DIAGNOSIS — R911 Solitary pulmonary nodule: Secondary | ICD-10-CM | POA: Diagnosis not present

## 2023-10-29 DIAGNOSIS — R790 Abnormal level of blood mineral: Secondary | ICD-10-CM | POA: Diagnosis not present

## 2023-10-30 ENCOUNTER — Other Ambulatory Visit: Payer: Self-pay

## 2023-11-03 DIAGNOSIS — C3490 Malignant neoplasm of unspecified part of unspecified bronchus or lung: Secondary | ICD-10-CM | POA: Diagnosis not present

## 2023-11-03 DIAGNOSIS — C3491 Malignant neoplasm of unspecified part of right bronchus or lung: Secondary | ICD-10-CM | POA: Diagnosis not present

## 2023-11-03 DIAGNOSIS — R911 Solitary pulmonary nodule: Secondary | ICD-10-CM | POA: Diagnosis not present

## 2023-11-04 ENCOUNTER — Encounter (INDEPENDENT_AMBULATORY_CARE_PROVIDER_SITE_OTHER): Payer: Self-pay | Admitting: Ophthalmology

## 2023-11-04 ENCOUNTER — Ambulatory Visit (INDEPENDENT_AMBULATORY_CARE_PROVIDER_SITE_OTHER): Admitting: Ophthalmology

## 2023-11-04 DIAGNOSIS — Z961 Presence of intraocular lens: Secondary | ICD-10-CM

## 2023-11-04 DIAGNOSIS — H35033 Hypertensive retinopathy, bilateral: Secondary | ICD-10-CM

## 2023-11-04 DIAGNOSIS — I10 Essential (primary) hypertension: Secondary | ICD-10-CM | POA: Diagnosis not present

## 2023-11-04 DIAGNOSIS — H353122 Nonexudative age-related macular degeneration, left eye, intermediate dry stage: Secondary | ICD-10-CM

## 2023-11-04 DIAGNOSIS — H353211 Exudative age-related macular degeneration, right eye, with active choroidal neovascularization: Secondary | ICD-10-CM

## 2023-11-04 MED ORDER — FARICIMAB-SVOA 6 MG/0.05ML IZ SOLN
6.0000 mg | INTRAVITREAL | Status: AC | PRN
  Administered 2023-11-04: 6 mg via INTRAVITREAL

## 2023-11-11 ENCOUNTER — Encounter (INDEPENDENT_AMBULATORY_CARE_PROVIDER_SITE_OTHER): Payer: Self-pay | Admitting: Gastroenterology

## 2023-11-16 DIAGNOSIS — I10 Essential (primary) hypertension: Secondary | ICD-10-CM | POA: Diagnosis not present

## 2023-11-16 DIAGNOSIS — Z299 Encounter for prophylactic measures, unspecified: Secondary | ICD-10-CM | POA: Diagnosis not present

## 2023-11-16 DIAGNOSIS — R490 Dysphonia: Secondary | ICD-10-CM | POA: Diagnosis not present

## 2023-11-16 DIAGNOSIS — C771 Secondary and unspecified malignant neoplasm of intrathoracic lymph nodes: Secondary | ICD-10-CM | POA: Diagnosis not present

## 2023-11-18 ENCOUNTER — Telehealth (INDEPENDENT_AMBULATORY_CARE_PROVIDER_SITE_OTHER): Payer: Self-pay

## 2023-11-18 NOTE — Telephone Encounter (Signed)
 Pt called stated that PCP would like Teoh to do a scope of her vocal cords as well to confirm if they have been damaged by the cancer.   They would like a call back.

## 2023-11-19 ENCOUNTER — Inpatient Hospital Stay: Attending: Oncology | Admitting: Oncology

## 2023-11-19 ENCOUNTER — Inpatient Hospital Stay

## 2023-11-19 VITALS — BP 87/67 | HR 93 | Temp 97.4°F | Resp 19 | Ht 67.0 in | Wt 165.0 lb

## 2023-11-19 DIAGNOSIS — C3411 Malignant neoplasm of upper lobe, right bronchus or lung: Secondary | ICD-10-CM | POA: Insufficient documentation

## 2023-11-19 DIAGNOSIS — Z87891 Personal history of nicotine dependence: Secondary | ICD-10-CM | POA: Insufficient documentation

## 2023-11-19 DIAGNOSIS — R590 Localized enlarged lymph nodes: Secondary | ICD-10-CM | POA: Insufficient documentation

## 2023-11-19 NOTE — Progress Notes (Signed)
 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 Summary of oncologic history is as follows:  Diagnosis: Likely metastatic non-small cell lung carcinoma  -07/25/2022: CT chest: Right upper lobe pulmonary nodule described as spiculated measuring 2.3 x 2.0 cm. Also noted a small 3 mm RML lung nodule  08/14/2022: PET: The spiculated right upper lobe pulmonary nodule demonstrates intense hypermetabolic activity, consistent with bronchogenic carcinoma. Single hypermetabolic right hilar lymph node consistent with nodal metastasis. No evidence of distant metastatic disease.  -09/02/2022: EBUS with RUL lesion biopsy. Cytology: Poorly differentiated squamous cell carcinoma. 10R lymph node  positive for squamous cell carcinoma.  -09/20/2022: MRI Brain: NED -11/17/2022: Partial right upper lobe resection.  Pathology: Invasive moderate to poorly differentiated squamous cell carcinoma, 4.3 cm. Staging: pT2b pN0. All margins are negative for carcinoma  -12/03/2022: CT CA: New right paratracheal lymphadenopathy. Increased right hilar lymphadenopathy. Findings are suspicious for progressive nodal metastatic disease in the chest. No evidence of metastatic disease in the abdomen.  -12/17/2022: Patient did not wish to undergo chemoimmunotherapy and proceeded with radiation therapy alone.  -12/30/2022-02/13/2023: Radiation therapy to RUL lesion completed -09/07/2023: CT CA: Indeterminate new right peribronchial and subcarinal soft tissue thickening with increasing right upper lobar bronchial narrowing and perihilar atelectatic consolidation. No new metastatic disease in the abdomen and pelvis.  -10/05/2023: PET: Intensely hypermetabolic right cervical, supraclavicular, and superior mediastinal  nodal metastases, new from 03/23/2023 and enlarging from 09/07/2023. Moderate to intense right upper lobe uptake, focally along the anterior surgical margin and posteriorly within atelectatic consolidation. This may represent local recurrence and/or infectious or inflammatory etiology. Queried right peribronchial and subcarinal soft tissue thickening and bronchial narrowing demonstrate uptake near background, favor postradiation change or scarring. No evidence of metastatic disease in the abdomen and pelvis.  -10/16/2023: MRI Brain: NED   History of Presenting Illness: Zoe [name] 88 y.o. female is referred by Lauraine Louder, ANP for Metastatic squamous cell carcinoma of the RUL of lung.  Patient was accompanied by her husband today.  She reported some hoarseness of voice that she noticed 1 month ago.  She was seen by her primary care doctor Dr.Vyas yesterday and is planning to see ENT tomorrow.  She does not have any other complaints today.  She reports occasional cough.  She reports being active around the house and spends 2 to 4 hours out of her recliner or bed.  We discussed that her PET scan showed diffuse lymphadenopathy likely consistent with metastatic disease at this time.  We discussed the need of biopsy and further mutational analysis and possibly chemotherapy with or without immunotherapy.  Patient at this time does not wish to have chemotherapy or immunotherapy.  She also is not willing to do biopsy at this time.  I suggested hospice referral and patient is agreeable to that.  Patient was a former smoker, quit 5 years ago.  She does not drink alcohol .  She lives with her husband in [place].   Medical History: Past Medical History:  Diagnosis Date   A-fib [facility])    pt went into A-fib during lung biopsies   Abdominal discomfort 11/02/2012   suspected ?UTI- PCP MD placed on Cipro    Anxiety    Arthritis    Asthma    Cancer (HCC)    skin CA  Choking due to phlegm    Chronic  constipation    Depression    Dysrhythmia    tachycardia   Environmental allergies    Fibromyalgia    GERD (gastroesophageal reflux disease)    Hemorrhoids    Hiatal hernia    Hypercholesteremia    Hypertension    Sleep apnea     Surgical history: Past Surgical History:  Procedure Laterality Date   ANAL RECTAL MANOMETRY N/A 11/28/2013   Procedure: ANO RECTAL MANOMETRY;  Surgeon: Bernarda Ned, MD;  [facility]: [facility];  Service: Endoscopy;  Laterality: N/A;   APPENDECTOMY     BACK SURGERY     X2    BIOPSY  03/13/2021   Procedure: BIOPSY;  Surgeon: Golda Claudis PENNER, MD;  [facility];  Service: Endoscopy;;   BRONCHIAL BIOPSY  09/02/2022   Procedure: BRONCHIAL BIOPSIES;  Surgeon: Brenna Adine CROME, DO;  Location: [facility];  Service: Pulmonary;;   BRONCHIAL BRUSHINGS  09/02/2022   Procedure: BRONCHIAL BRUSHINGS;  Surgeon: Brenna Adine CROME, DO;  [facility];  Service: Pulmonary;;   BRONCHIAL NEEDLE ASPIRATION BIOPSY  09/02/2022   Procedure: BRONCHIAL NEEDLE ASPIRATION BIOPSIES;  Surgeon: Brenna Adine CROME, DO;  [facility];  Service: Pulmonary;;   CHOLECYSTECTOMY     COLONOSCOPY  03/29/2003   COLONOSCOPY N/A 12/09/2013   Procedure: COLONOSCOPY;  Surgeon: Claudis PENNER Golda, MD;  [facility];  Service: Endoscopy;  Laterality: N/A;  730   COLONOSCOPY N/A 02/04/2017   Procedure: COLONOSCOPY;  Surgeon: Golda Claudis PENNER, MD;  [facility];  Service: Endoscopy;  Laterality: N/A;  930   ENDOBRONCHIAL ULTRASOUND Bilateral 09/02/2022   Procedure: ENDOBRONCHIAL ULTRASOUND;  Surgeon: Brenna Adine CROME, DO;  [facility];  Service: Pulmonary;  Laterality: Bilateral;   ESOPHAGEAL DILATION N/A 03/13/2021   Procedure: ESOPHAGEAL DILATION;  Surgeon: Golda Claudis PENNER, MD;  [facility];  Service: Endoscopy;  Laterality: N/A;   ESOPHAGOGASTRODUODENOSCOPY (EGD) WITH PROPOFOL  N/A 03/13/2021   Procedure:  ESOPHAGOGASTRODUODENOSCOPY (EGD) WITH PROPOFOL ;  Surgeon: Golda Claudis PENNER, MD;  [facility];  Service: Endoscopy;  Laterality: N/A;  8:05   EYE SURGERY     bilateral cataract removal   INTERCOSTAL NERVE BLOCK Right 11/17/2022   Procedure: INTERCOSTAL NERVE BLOCK;  Surgeon: Shyrl Linnie KIDD, MD;  [facility];  Service: Thoracic;  Laterality: Right;   IR KYPHO LUMBAR INC FX REDUCE BONE BX UNI/BIL CANNULATION INC/IMAGING  11/24/2019   IR KYPHO THORACIC WITH BONE BIOPSY  08/23/2019   IR KYPHO THORACIC WITH BONE BIOPSY  11/24/2019   IR RADIOLOGIST EVAL & MGMT  08/03/2019   IR RADIOLOGIST EVAL & MGMT  09/29/2019   IR RADIOLOGIST EVAL & MGMT  10/18/2019   IR RADIOLOGIST EVAL & MGMT  04/11/2020   NODE DISSECTION Right 11/17/2022   Procedure: NODE DISSECTION;  Surgeon: Shyrl Linnie KIDD, MD;  [facility];  Service: Thoracic;  Laterality: Right;   SKIN CANCER EXCISION     RIGHT NECK     SPINE SURGERY     TONSILLECTOMY     T+A   TOTAL HIP ARTHROPLASTY Right 09/14/2012   Procedure: RIGHT TOTAL HIP ARTHROPLASTY ANTERIOR APPROACH and RIGHT KNEE STEROID INJECTION;  Surgeon: Lonni CINDERELLA Poli, MD;  [facility];  Service: Orthopedics;  Laterality: Right;   TOTAL HIP ARTHROPLASTY Left 11/05/2012   Procedure: LEFT TOTAL HIP ARTHROPLASTY ANTERIOR APPROACH;  Surgeon: Lonni CINDERELLA Poli, MD;  [facility]: [facility];  Service:  Orthopedics;  Laterality: Left;   UMBILICAL HERNIA REPAIR  04/26/2003   JENKINS   UPPER GASTROINTESTINAL ENDOSCOPY  08/03/2009   NUR   UPPER GASTROINTESTINAL ENDOSCOPY  03/29/2003   EGD ED TCS     Allergies:  is allergic to codeine, other, oxycodone , paxil [paroxetine hcl], and procaine.  Medications:  Current Outpatient Medications  Medication Sig Dispense Refill   budesonide  (PULMICORT ) 0.25 MG/2ML nebulizer solution Inhale 0.25 mg into the lungs.     famotidine  (PEPCID ) 20 MG tablet Take 20 mg by mouth.     IVERMECTIN PO Take by mouth.      albuterol  (PROVENTIL ) (2.5 MG/3ML) 0.083% nebulizer solution Take 3 mLs (2.5 mg total) by nebulization every 6 (six) hours as needed. 75 mL 12   albuterol  (VENTOLIN  HFA) 108 (90 Base) MCG/ACT inhaler Inhale 2 puffs into the lungs every 6 (six) hours as needed for wheezing or shortness of breath. 18 g 11   allopurinol  (ZYLOPRIM ) 300 MG tablet Take 1 tablet (300 mg total) by mouth daily. 90 tablet 1   ALPRAZolam  (XANAX ) 0.5 MG tablet Take 0.5 mg by mouth 2 (two) times daily as needed for anxiety.     apixaban  (ELIQUIS ) 5 MG TABS tablet TAKE 1 TABLET BY MOUTH TWICE A DAY 60 tablet 5   APPLE CIDER VINEGAR PO Take 625 mg by mouth daily.     Biotin  5000 MCG TABS Take 5,000-10,000 mcg by mouth daily.     budesonide -formoterol  (SYMBICORT ) 160-4.5 MCG/ACT inhaler Inhale 2 puffs into the lungs 2 (two) times daily as needed. 10.2 g 1   calcium-vitamin D  250-100 MG-UNIT tablet Take 1 tablet by mouth daily.     Cholecalciferol (VITAMIN D -3) 125 MCG (5000 UT) TABS Take 5,000 Units by mouth daily.     cyanocobalamin  2000 MCG tablet Take 5,000 mcg by mouth daily.     diclofenac  Sodium (VOLTAREN ) 1 % GEL Apply 2-4 g topically 4 (four) times daily. APPLY 2-4 GRAMS TOPICALLY FOUR TIMES DAILY Strength: 1 % 200 g 3   dorzolamide  (TRUSOPT ) 2 % ophthalmic solution Place 1 drop into the right eye daily.     doxepin  (SINEQUAN ) 100 MG capsule Take 1 capsule (100 mg total) by mouth at bedtime. 90 capsule 1   gabapentin  (NEURONTIN ) 300 MG capsule Take 300 mg by mouth 4 (four) times daily as needed (pain).     levocetirizine (XYZAL) 5 MG tablet SMARTSIG:1 Tablet(s) By Mouth Every Evening     lubiprostone  (AMITIZA ) 24 MCG capsule Take 1 capsule (24 mcg total) by mouth 2 (two) times daily with a meal. 180 capsule 3   magnesium  oxide (MAG-OX) 400 (240 Mg) MG tablet Take 400 mg by mouth every Monday, Wednesday, and Friday.     meloxicam  (MOBIC ) 15 MG tablet Take 15 mg by mouth daily.     metoprolol  tartrate (LOPRESSOR ) 25 MG  tablet Take 0.5 tablets (12.5 mg total) by mouth daily as needed. If BP and HR is greater than 100 and for palpitations 90 tablet 2   mometasone  (ELOCON ) 0.1 % cream Apply topically daily as needed for itch 15 g 3   omeprazole  (PRILOSEC) 40 MG capsule Take 1 capsule by mouth once daily 90 capsule 3   Potassium 99 MG TABS Take 99 mg by mouth every Monday, Wednesday, and Friday.     predniSONE  (DELTASONE ) 10 MG tablet For breathing or pain increase prednisone   to 10 mg x 2 until better then 1 daily x 5 days then one half 100  tablet 0   senna (SENOKOT) 8.6 MG tablet Take 5 tablets by mouth 2 (two) times daily.     Travoprost, BAK Free, (TRAVATAN) 0.004 % SOLN ophthalmic solution Place 1 drop into both eyes at bedtime.     No current facility-administered medications for this visit.   Facility-Administered Medications Ordered in Other Visits  Medication Dose Route Frequency Provider Last Rate Last Admin   regadenoson  (LEXISCAN ) injection SOLN 0.4 mg  0.4 mg Intravenous Once Hilty, Vinie BROCKS, MD       technetium tetrofosmin  (TC-MYOVIEW ) injection 31.4 millicurie  31.4 millicurie Intravenous Once PRN Hilty, Vinie BROCKS, MD        Review of Systems: Constitutional: Denies fevers, chills or abnormal night sweats Eyes: Denies blurriness of vision, double vision or watery eyes Ears, nose, mouth, throat, and face: Denies mucositis or sore throat Respiratory: Denies cough, dyspnea or wheezes Cardiovascular: Denies palpitation, chest discomfort or lower extremity swelling Gastrointestinal:  Denies nausea, heartburn or change in bowel habits Skin: Denies abnormal skin rashes Lymphatics: Denies new lymphadenopathy or easy bruising Neurological:Denies numbness, tingling or new weaknesses Behavioral/Psych: Mood is stable, no new changes  All other systems were reviewed with the patient and are negative.  Physical Examination: ECOG PERFORMANCE STATUS: 2 - Symptomatic, <50% confined to bed  Vitals:    11/19/23 1325  BP: (!) 87/67  Pulse: 93  Resp: 19  Temp: (!) 97.4 F (36.3 C)  SpO2: 96%   Filed Weights   11/19/23 1325  Weight: 165 lb (74.8 kg)    GENERAL:alert, no distress and comfortable, frail HEENT: Hoarseness and voice SKIN: skin color, texture, turgor are normal, no rashes or significant lesions EYES: normal, conjunctiva are pink and non-injected, sclera clear LUNGS: clear to auscultation and percussion with normal breathing effort HEART: regular rate & rhythm and no murmurs and no lower extremity edema ABDOMEN:abdomen soft, non-tender and normal bowel sounds Musculoskeletal:no cyanosis of digits and no clubbing  NEURO: alert & oriented x 3 with fluent speech   Laboratory Data: I have reviewed the data as listed Lab Results  Component Value Date   WBC 7.0 02/23/2023   HGB 11.6 02/23/2023   HCT 34.4 02/23/2023   MCV 91 02/23/2023   PLT 224 02/23/2023   Recent Labs    11/29/22 1454 11/29/22 1505 01/12/23 1201 02/23/23 1110  NA 141  --  135 139  K 4.4  --  3.8 4.3  CL 107  --  103 99  CO2 27  --  21* 21  GLUCOSE 110*  --  124* 137*  BUN 21  --  24* 22  CREATININE 0.74 0.80 0.71 0.93  CALCIUM 8.5*  --  8.7* 9.5  GFRNONAA >60  --  >60  --   PROT 5.8*  --  6.4*  --   ALBUMIN 2.8*  --  3.4*  --   AST 13*  --  14*  --   ALT 8  --  10  --   ALKPHOS 77  --  63  --   BILITOT 0.3  --  0.6  --     Radiographic Studies: I have personally reviewed the radiological images as listed and agreed with the findings in the report.  Radiological reports reviewed as above  ASSESSMENT & PLAN:  Patient is a 88 y.o. female presenting for likely metastatic non-small cell lung carcinoma  Metastatic non-small cell lung cancer Extensive oncology history above.  -Continue diffuse lymphadenopathy is likely metastatic non-small cell lung carcinoma.  We discussed the necessity of biopsy to confirm the diagnosis and further management. -We also discussed that likely  treatment option would be chemotherapy, immunotherapy or targeted agent based on mutation analysis/NGS. -Patient does not want to undergo chemotherapy or immunotherapy at this time. - We also discussed poor prognosis with metastatic carcinoma. -Considering patient's age and comorbidities, would not likely give her full dose chemotherapy. - We discussed role of palliative therapy in hospice, symptomatic management and comfort care.  Patient wishes to proceed with this route.  Will refer to hospice at this time.   Recommended patient to reach out to us  in future if she changes her mind or if she needs any kind of support.   No orders of the defined types were placed in this encounter.   The total time spent in the appointment was 60 minutes encounter with patients including review of chart and various tests results, discussions about plan of care and coordination of care plan   All questions were answered. The patient knows to call the clinic with any problems, questions or concerns. No barriers to learning was detected.  Mickiel Dry, MD 10/23/202510:40 PM

## 2023-11-19 NOTE — Patient Instructions (Addendum)
 [facility]  Discharge Instructions  You were seen and examined today by Dr. Davonna. Dr. Davonna is a medical oncologist, meaning that she specializes in the treatment of cancer diagnoses. Dr. Davonna discussed your past medical history, family history of cancers, and the events that led to you being here today.  You were referred to Dr. Davonna due to an abnormal PET scan which revealed a lung mass and enlarged lymph nodes concerning for cancer recurrence.  Dr. Davonna has recommended a biopsy to accurately diagnose the cancer so that it can be appropriately treated. The most probable treatment is chemotherapy and immunotherapy together. In your functional status, chemotherapy and immunotherapy may not be well tolerated.  Should you choose not to pursue treatment, we can refer you to hospice care to help manage your symptoms at home.  Thank you for choosing [facility] to provide your oncology and hematology care.   To afford each patient quality time with our provider, please arrive at least 15 minutes before your scheduled appointment time. You may need to reschedule your appointment if you arrive late (10 or more minutes). Arriving late affects you and other patients whose appointments are after yours.  Also, if you miss three or more appointments without notifying the office, you may be dismissed from the clinic at the provider's discretion.    Again, thank you for choosing [facility].  Our hope is that these requests will decrease the amount of time that you wait before being seen by our physicians.   If you have a lab appointment with the Cancer Center - please note that after April 8th, all labs will be drawn in the cancer center.  You do not have to check in or register with the main entrance as you have in the past but will complete your check-in at the cancer center.             _____________________________________________________________  Should you have questions after your visit to [facility], please contact our office at [phone] and follow the prompts.  Our office hours are 8:00 a.m. to 4:30 p.m. Monday - Thursday and 8:00 a.m. to 2:30 p.m. Friday.  Please note that voicemails left after 4:00 p.m. may not be returned until the following business day.  We are closed weekends and all major holidays.  You do have access to a nurse 24-7, just call the main number to the clinic [phone] and do not press any options, hold on the line and a nurse will answer the phone.    For prescription refill requests, have your pharmacy contact our office and allow 72 hours.    Masks are no longer required in the cancer centers. If you would like for your care team to wear a mask while they are taking care of you, please let them know. You may have one support person who is at least 86 years old accompany you for your appointments.

## 2023-11-20 ENCOUNTER — Ambulatory Visit (INDEPENDENT_AMBULATORY_CARE_PROVIDER_SITE_OTHER): Admitting: Otolaryngology

## 2023-11-20 ENCOUNTER — Encounter (INDEPENDENT_AMBULATORY_CARE_PROVIDER_SITE_OTHER): Payer: Self-pay | Admitting: Otolaryngology

## 2023-11-20 VITALS — BP 109/63 | HR 78 | Temp 97.4°F | Ht 67.0 in | Wt 165.0 lb

## 2023-11-20 DIAGNOSIS — J338 Other polyp of sinus: Secondary | ICD-10-CM

## 2023-11-20 DIAGNOSIS — H608X3 Other otitis externa, bilateral: Secondary | ICD-10-CM | POA: Diagnosis not present

## 2023-11-20 DIAGNOSIS — J310 Chronic rhinitis: Secondary | ICD-10-CM | POA: Diagnosis not present

## 2023-11-20 DIAGNOSIS — H6123 Impacted cerumen, bilateral: Secondary | ICD-10-CM

## 2023-11-20 DIAGNOSIS — R0981 Nasal congestion: Secondary | ICD-10-CM

## 2023-11-20 DIAGNOSIS — J342 Deviated nasal septum: Secondary | ICD-10-CM | POA: Diagnosis not present

## 2023-11-20 DIAGNOSIS — J343 Hypertrophy of nasal turbinates: Secondary | ICD-10-CM

## 2023-11-20 DIAGNOSIS — H903 Sensorineural hearing loss, bilateral: Secondary | ICD-10-CM

## 2023-11-21 NOTE — Progress Notes (Signed)
 Patient ID: Zoe [name], female   DOB: 1935/07/03, 88 y.o.   MRN: [id number]  Follow-up: Chronic eczematous otitis externa, chronic nasal congestion, bilateral high-frequency hearing loss  HPI: The patient is an 88 year old female who returns today for her follow-up evaluation.  The patient was previously seen for multiple medical issues, including chronic eczematous otitis externa, chronic nasal congestion, and bilateral high-frequency sensorineural hearing loss.  At her last visit in July 2025, she was noted to have nasal septal deviation, bilateral inferior turbinate hypertrophy, and right nasal polyps.  Treated with Elocon  cream and Flonase nasal spray.  The patient returns today reporting improvement in her nasal congestion.  Her itchy ears are controlled with the Elocon  cream.  She denies any change in her hearing.  However, she was diagnosed with stage IV lung cancer that was not curable.  She is seeking alternative treatment.  Currently she denies any facial pain, fever, otalgia, or otorrhea.  Exam: General: Communicates without difficulty, well nourished, no acute distress. Head: Normocephalic, no evidence injury, no tenderness, facial buttresses intact without stepoff. Face/sinus: No tenderness to palpation and percussion. Facial movement is normal and symmetric. Eyes: PERRL, EOMI. No scleral icterus, conjunctivae clear. Neuro: CN II exam reveals vision grossly intact.  No nystagmus at any point of gaze. Ears: Auricles well formed without lesions.  Bilateral cerumen impaction.  Nose: External evaluation reveals normal support and skin without lesions.  Dorsum is intact.  Anterior rhinoscopy reveals congested mucosa over anterior aspect of inferior turbinates and intact septum.  No purulence noted. Oral:  Oral cavity and oropharynx are intact, symmetric, without erythema or edema.  Mucosa is moist without lesions. Neck: Full range of motion without pain.  There is no significant lymphadenopathy.   No masses palpable.  Thyroid  bed within normal limits to palpation.  Parotid glands and submandibular glands equal bilaterally without mass.  Trachea is midline. Neuro:  CN 2-12 grossly intact.   Procedure: Bilateral cerumen disimpaction Anesthesia: None Description: Under the operating microscope, the cerumen is carefully removed with a combination of cerumen currette, alligator forceps, and suction catheters.  After the cerumen is removed, the TMs are noted to be normal.  Mild eczematous changes are noted within the ear canals.  The patient tolerated the procedure well.    Assessment: 1.  The patient is noted to have bilateral cerumen impaction today.  After the disimpaction procedure, both tympanic membranes and middle ear spaces are noted to be normal. 2.  Chronic eczematous otitis externa. 3.  Chronic rhinitis with nasal mucosal congestion, nasal septal deviation, bilateral inferior turbinate hypertrophy, and right nasal polyp.  Currently she is not symptomatic. 4.  Subjectively stable bilateral high-frequency sensorineural hearing loss.  Plan: 1.  Otomicroscopy with bilateral cerumen disimpaction. 2.  Continue with Elocon  cream and Flonase nasal spray as needed. 3.  The patient is reassured and no acute infection is noted today. 4.  The patient will return for reevaluation in 6 months.

## 2023-11-23 ENCOUNTER — Other Ambulatory Visit: Payer: Self-pay | Admitting: Gastroenterology

## 2023-11-23 ENCOUNTER — Inpatient Hospital Stay

## 2023-11-23 ENCOUNTER — Other Ambulatory Visit: Payer: Self-pay | Admitting: Internal Medicine

## 2023-11-23 ENCOUNTER — Inpatient Hospital Stay: Admitting: Oncology

## 2023-11-23 DIAGNOSIS — K581 Irritable bowel syndrome with constipation: Secondary | ICD-10-CM

## 2023-11-23 NOTE — Progress Notes (Shared)
 [facility] - Clinic Note  12/07/2023   CHIEF COMPLAINT Patient presents for No chief complaint on file.  HISTORY OF PRESENT ILLNESS: Zoe [name] is a 88 y.o. female who presents to the clinic today for:    Referring physician: Fleeta Zerita DASEN, MD [address] [place],  [place] [address]  HISTORICAL INFORMATION:  Selected notes from the MEDICAL RECORD NUMBER Referred by Dr. Fleeta for concern of exu ARMD OD LEE:  Ocular Hx- PMH-   CURRENT MEDICATIONS: Current Outpatient Medications (Ophthalmic Drugs)  Medication Sig   dorzolamide  (TRUSOPT ) 2 % ophthalmic solution Place 1 drop into the right eye daily.   Travoprost, BAK Free, (TRAVATAN) 0.004 % SOLN ophthalmic solution Place 1 drop into both eyes at bedtime.   No current facility-administered medications for this visit. (Ophthalmic Drugs)   Current Outpatient Medications (Other)  Medication Sig   albuterol  (PROVENTIL ) (2.5 MG/3ML) 0.083% nebulizer solution Take 3 mLs (2.5 mg total) by nebulization every 6 (six) hours as needed.   albuterol  (VENTOLIN  HFA) 108 (90 Base) MCG/ACT inhaler Inhale 2 puffs into the lungs every 6 (six) hours as needed for wheezing or shortness of breath.   allopurinol  (ZYLOPRIM ) 300 MG tablet Take 1 tablet (300 mg total) by mouth daily.   ALPRAZolam  (XANAX ) 0.5 MG tablet Take 0.5 mg by mouth 2 (two) times daily as needed for anxiety.   apixaban  (ELIQUIS ) 5 MG TABS tablet TAKE 1 TABLET BY MOUTH TWICE A DAY   APPLE CIDER VINEGAR PO Take 625 mg by mouth daily.   Biotin  5000 MCG TABS Take 5,000-10,000 mcg by mouth daily.   budesonide  (PULMICORT ) 0.25 MG/2ML nebulizer solution Inhale 0.25 mg into the lungs.   budesonide -formoterol  (SYMBICORT ) 160-4.5 MCG/ACT inhaler Inhale 2 puffs into the lungs 2 (two) times daily as needed.   calcium-vitamin D  250-100 MG-UNIT tablet Take 1 tablet by mouth daily.   Cholecalciferol (VITAMIN D -3) 125 MCG (5000 UT) TABS Take 5,000 Units by mouth  daily.   cyanocobalamin  2000 MCG tablet Take 5,000 mcg by mouth daily.   diclofenac  Sodium (VOLTAREN ) 1 % GEL Apply 2-4 g topically 4 (four) times daily. APPLY 2-4 GRAMS TOPICALLY FOUR TIMES DAILY Strength: 1 %   doxepin  (SINEQUAN ) 100 MG capsule Take 1 capsule (100 mg total) by mouth at bedtime.   famotidine  (PEPCID ) 20 MG tablet Take 20 mg by mouth.   gabapentin  (NEURONTIN ) 300 MG capsule Take 300 mg by mouth 4 (four) times daily as needed (pain).   IVERMECTIN PO Take by mouth.   levocetirizine (XYZAL) 5 MG tablet SMARTSIG:1 Tablet(s) By Mouth Every Evening   lubiprostone  (AMITIZA ) 24 MCG capsule TAKE 1 CAPSULE BY MOUTH TWICE A DAY WITH MEALS   magnesium  oxide (MAG-OX) 400 (240 Mg) MG tablet Take 400 mg by mouth every Monday, Wednesday, and Friday.   meloxicam  (MOBIC ) 15 MG tablet Take 15 mg by mouth daily.   metoprolol  tartrate (LOPRESSOR ) 25 MG tablet Take 0.5 tablets (12.5 mg total) by mouth daily as needed. If BP and HR is greater than 100 and for palpitations   mometasone  (ELOCON ) 0.1 % cream Apply topically daily as needed for itch   omeprazole  (PRILOSEC) 40 MG capsule Take 1 capsule by mouth once daily   Potassium 99 MG TABS Take 99 mg by mouth every Monday, Wednesday, and Friday.   predniSONE  (DELTASONE ) 10 MG tablet For breathing or pain increase prednisone   to 10 mg x 2 until better then 1 daily x 5 days then  one half   senna (SENOKOT) 8.6 MG tablet Take 5 tablets by mouth 2 (two) times daily.   No current facility-administered medications for this visit. (Other)   Facility-Administered Medications Ordered in Other Visits (Other)  Medication Route   regadenoson  (LEXISCAN ) injection SOLN 0.4 mg Intravenous   technetium tetrofosmin  (TC-MYOVIEW ) injection 31.4 millicurie Intravenous   REVIEW OF SYSTEMS:   ALLERGIES Allergies  Allergen Reactions   Codeine Other (See Comments)    Pains in abdominal area   Other     NOVACAINE     ITCHING Anti-depressants   Oxycodone       Abdominal pain, chest pain   Paxil [Paroxetine Hcl] Itching   Procaine Itching   PAST MEDICAL HISTORY Past Medical History:  Diagnosis Date   A-fib [facility])    pt went into A-fib during lung biopsies   Abdominal discomfort 11/02/2012   suspected ?UTI- PCP MD placed on Cipro    Anxiety    Arthritis    Asthma    Cancer (HCC)    skin CA   Choking due to phlegm    Chronic constipation    Depression    Dysrhythmia    tachycardia   Environmental allergies    Fibromyalgia    GERD (gastroesophageal reflux disease)    Hemorrhoids    Hiatal hernia    Hypercholesteremia    Hypertension    Sleep apnea    Past Surgical History:  Procedure Laterality Date   ANAL RECTAL MANOMETRY N/A 11/28/2013   Procedure: ANO RECTAL MANOMETRY;  Surgeon: Bernarda Ned, MD;  Location: [facility];  Service: Endoscopy;  Laterality: N/A;   APPENDECTOMY     BACK SURGERY     X2    BIOPSY  03/13/2021   Procedure: BIOPSY;  Surgeon: Golda Claudis PENNER, MD;  Location: [facility];  Service: Endoscopy;;   BRONCHIAL BIOPSY  09/02/2022   Procedure: BRONCHIAL BIOPSIES;  Surgeon: Brenna Adine CROME, DO;  Location: [facility];  Service: Pulmonary;;   BRONCHIAL BRUSHINGS  09/02/2022   Procedure: BRONCHIAL BRUSHINGS;  Surgeon: Brenna Adine CROME, DO;  Location: [facility];  Service: Pulmonary;;   BRONCHIAL NEEDLE ASPIRATION BIOPSY  09/02/2022   Procedure: BRONCHIAL NEEDLE ASPIRATION BIOPSIES;  Surgeon: Brenna Adine CROME, DO;  Location: [facility];  Service: Pulmonary;;   CHOLECYSTECTOMY     COLONOSCOPY  03/29/2003   COLONOSCOPY N/A 12/09/2013   Procedure: COLONOSCOPY;  Surgeon: Claudis PENNER Golda, MD;  [facility]: [facility];  Service: Endoscopy;  Laterality: N/A;  730   COLONOSCOPY N/A 02/04/2017   Procedure: COLONOSCOPY;  Surgeon: Golda Claudis PENNER, MD;  [facility]: [facility];  Service: Endoscopy;  Laterality: N/A;  930   ENDOBRONCHIAL ULTRASOUND Bilateral 09/02/2022   Procedure: ENDOBRONCHIAL ULTRASOUND;   Surgeon: Brenna Adine CROME, DO;  Location: [facility];  Service: Pulmonary;  Laterality: Bilateral;   ESOPHAGEAL DILATION N/A 03/13/2021   Procedure: ESOPHAGEAL DILATION;  Surgeon: Golda Claudis PENNER, MD;  [facility];  Service: Endoscopy;  Laterality: N/A;   ESOPHAGOGASTRODUODENOSCOPY (EGD) WITH PROPOFOL  N/A 03/13/2021   Procedure: ESOPHAGOGASTRODUODENOSCOPY (EGD) WITH PROPOFOL ;  Surgeon: Golda Claudis PENNER, MD;  [facility]: [facility];  Service: Endoscopy;  Laterality: N/A;  8:05   EYE SURGERY     bilateral cataract removal   INTERCOSTAL NERVE BLOCK Right 11/17/2022   Procedure: INTERCOSTAL NERVE BLOCK;  Surgeon: Shyrl Linnie KIDD, MD;  Location: [facility];  Service: Thoracic;  Laterality: Right;   IR KYPHO LUMBAR INC FX REDUCE BONE BX UNI/BIL CANNULATION INC/IMAGING  11/24/2019   IR KYPHO THORACIC WITH BONE BIOPSY  08/23/2019   IR KYPHO THORACIC WITH BONE BIOPSY  11/24/2019   IR RADIOLOGIST EVAL & MGMT  08/03/2019   IR RADIOLOGIST EVAL & MGMT  09/29/2019   IR RADIOLOGIST EVAL & MGMT  10/18/2019   IR RADIOLOGIST EVAL & MGMT  04/11/2020   NODE DISSECTION Right 11/17/2022   Procedure: NODE DISSECTION;  Surgeon: Shyrl Linnie KIDD, MD;  Location: [facility];  Service: Thoracic;  Laterality: Right;   SKIN CANCER EXCISION     RIGHT NECK     SPINE SURGERY     TONSILLECTOMY     T+A   TOTAL HIP ARTHROPLASTY Right 09/14/2012   Procedure: RIGHT TOTAL HIP ARTHROPLASTY ANTERIOR APPROACH and RIGHT KNEE STEROID INJECTION;  Surgeon: Lonni CINDERELLA Poli, MD;  Location: [facility];  Service: Orthopedics;  Laterality: Right;   TOTAL HIP ARTHROPLASTY Left 11/05/2012   Procedure: LEFT TOTAL HIP ARTHROPLASTY ANTERIOR APPROACH;  Surgeon: Lonni CINDERELLA Poli, MD;  Location: [facility];  Service: Orthopedics;  Laterality: Left;   UMBILICAL HERNIA REPAIR  04/26/2003   JENKINS   UPPER GASTROINTESTINAL ENDOSCOPY  08/03/2009   NUR   UPPER GASTROINTESTINAL ENDOSCOPY  03/29/2003   EGD ED TCS   FAMILY  HISTORY Family History  Problem Relation Age of Onset   Healthy Son    Healthy Son    Healthy Daughter    Colon cancer Neg Hx    SOCIAL HISTORY Social History   Tobacco Use   Smoking status: Former    Current packs/day: 0.00    Average packs/day: 0.5 packs/day for 50.0 years (25.0 ttl pk-yrs)    Types: Cigarettes    Start date: 09/07/1970    Quit date: 09/06/2020    Years since quitting: 3.2   Smokeless tobacco: Never  Vaping Use   Vaping status: Never Used  Substance Use Topics   Alcohol  use: Yes    Comment: 1-2 drinks per week   Drug use: No       OPHTHALMIC EXAM:  Not recorded    IMAGING AND PROCEDURES  Imaging and Procedures for 12/07/2023         ASSESSMENT/PLAN:   ICD-10-CM   1. Exudative age-related macular degeneration of right eye with active choroidal neovascularization (HCC)  H35.3211     2. 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 Essential hypertension  I10     4. Hypertensive retinopathy of both eyes  H35.033     5. Pseudophakia, both eyes  Z96.1       1. 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, OD - s/p IVA OD #1 (01.28.25), #2 (01.28.25), #3 (03.25.25), #4 (06.18.2025) -- IVA resistance ====================== - s/p IVV OD #1 (04.21.25 -- sample), #2 (05.21.25--sample), #3 (06.18.25 sample), #4 (07.18.25 sample), #5 (09.09.25 sample), #6 (sample--10.08.25)   - exam shows central CNV w/ +heme -- improved  - BCVA OD 20/60 from 20/200 - OCT shows Interval improvement in central SRF, IRF / cystic changes, persistent central PED / [facility] -- improved -- RPE rip at 5 wks - recommend IVV today OD #7 (sample--11.10.25) w/ f/u in 5 wks - pt wishes to be treated with IVV - RBA of procedure discussed, questions answered - IVV informed consent obtained and signed, 04.23.25 - see procedure note  - f/u in 5 wks -- DFE/OCT, possible injection   2. Age related macular degeneration, non-exudative,  OS - The incidence, anatomy, and pathology of dry  AMD, risk of progression, and the AREDS and AREDS 2 study including smoking risks discussed with patient.  - Recommend amsler grid monitoring  - monitor  3,4. Hypertensive retinopathy OU - discussed importance of tight BP control - monitor  5. Pseudophakia OU  - s/p CE/IOL (Dr. Alverta)  - IOL in good position, doing well  - monitor  Ophthalmic Meds Ordered this visit:  No orders of the defined types were placed in this encounter.    No follow-ups on file.  There are no Patient Instructions on file for this visit.  This document serves as a record of services personally performed by Redell JUDITHANN Hans, MD, PhD. It was created on their behalf by Avelina Pereyra, COA an ophthalmic technician. The creation of this record is the provider's dictation and/or activities during the visit.   Electronically signed by: Avelina GORMAN Pereyra, COT  11/23/23  3:00 PM    Redell JUDITHANN Hans, M.D., Ph.D. Diseases & Surgery of the Retina and Vitreous [facility]    Abbreviations: M myopia (nearsighted); A astigmatism; H hyperopia (farsighted); P presbyopia; Mrx spectacle prescription;  CTL contact lenses; OD right eye; OS left eye; OU both eyes  XT exotropia; ET esotropia; PEK punctate epithelial keratitis; PEE punctate epithelial erosions; DES dry eye syndrome; MGD meibomian gland dysfunction; ATs artificial tears; PFAT's preservative free artificial tears; NSC nuclear sclerotic cataract; PSC posterior subcapsular cataract; ERM epi-retinal membrane; PVD posterior vitreous detachment; RD retinal detachment; DM diabetes mellitus; DR diabetic retinopathy; NPDR non-proliferative diabetic retinopathy; PDR proliferative diabetic retinopathy; CSME clinically significant macular edema; DME diabetic macular edema; dbh dot blot hemorrhages; CWS cotton wool spot; POAG primary open angle glaucoma; C/D cup-to-disc ratio; HVF humphrey visual field; GVF  goldmann visual field; OCT optical coherence tomography; IOP intraocular pressure; BRVO Branch retinal vein occlusion; CRVO central retinal vein occlusion; CRAO central retinal artery occlusion; BRAO branch retinal artery occlusion; RT retinal tear; SB scleral buckle; PPV pars plana vitrectomy; VH Vitreous hemorrhage; PRP panretinal laser photocoagulation; IVK intravitreal kenalog ; VMT vitreomacular traction; MH Macular hole;  NVD neovascularization of the disc; NVE neovascularization elsewhere; AREDS age related eye disease study; ARMD age related macular degeneration; POAG primary open angle glaucoma; EBMD epithelial/anterior basement membrane dystrophy; ACIOL anterior chamber intraocular lens; IOL intraocular lens; PCIOL posterior chamber intraocular lens; Phaco/IOL phacoemulsification with intraocular lens placement; PRK photorefractive keratectomy; LASIK laser assisted in situ keratomileusis; HTN hypertension; DM diabetes mellitus; COPD chronic obstructive pulmonary disease

## 2023-11-26 ENCOUNTER — Ambulatory Visit (INDEPENDENT_AMBULATORY_CARE_PROVIDER_SITE_OTHER)

## 2023-11-26 ENCOUNTER — Encounter (INDEPENDENT_AMBULATORY_CARE_PROVIDER_SITE_OTHER): Payer: Self-pay | Admitting: Gastroenterology

## 2023-11-26 VITALS — BP 104/72 | HR 103 | Wt 159.0 lb

## 2023-11-26 DIAGNOSIS — R490 Dysphonia: Secondary | ICD-10-CM | POA: Diagnosis not present

## 2023-11-26 DIAGNOSIS — J3801 Paralysis of vocal cords and larynx, unilateral: Secondary | ICD-10-CM

## 2023-11-26 NOTE — Progress Notes (Signed)
 HPI:   Discussed the use of AI scribe software for clinical note transcription with the patient, who gave verbal consent to proceed.  History of Present Illness Zoe [name] is an 88 year old female with stage four non-small cell lung cancer who presents with voice loss and swallowing difficulties.  She lost her voice approximately six weeks ago. She has stage four non-small cell lung cancer and reports having undergone surgery and radiation. Six months post-surgery, cancer was found in three areas of the lung, all on the right side, including the supra-clavicular area.  She experiences difficulty swallowing, particularly with liquids, which started about a month ago. She describes a sensation of choking and is concerned about aspirating liquids. She uses applesauce to help swallow pills. She does not endorse difficulty swallowing solids. She has no recent hx of aspiration pneumonia. She has been on an aggressive holistic ivermectin therapy for the past two to two and a half months. She follows with Dr. Karis for her ears.     PMH/Meds/All/SocHx/FamHx/ROS: Past Medical History:  Diagnosis Date   A-fib [facility])    pt went into A-fib during lung biopsies   Abdominal discomfort 11/02/2012   suspected ?UTI- PCP MD placed on Cipro    Anxiety    Arthritis    Asthma    Cancer (HCC)    skin CA   Choking due to phlegm    Chronic constipation    Depression    Dysrhythmia    tachycardia   Environmental allergies    Fibromyalgia    GERD (gastroesophageal reflux disease)    Hemorrhoids    Hiatal hernia    Hypercholesteremia    Hypertension    Sleep apnea    Past Surgical History:  Procedure Laterality Date   ANAL RECTAL MANOMETRY N/A 11/28/2013   Procedure: ANO RECTAL MANOMETRY;  Surgeon: Bernarda Ned, MD;  Location: [facility];  Service: Endoscopy;  Laterality: N/A;   APPENDECTOMY     BACK SURGERY     X2    BIOPSY  03/13/2021   Procedure: BIOPSY;  Surgeon: Golda Claudis PENNER,  MD;  Location: [facility];  Service: Endoscopy;;   BRONCHIAL BIOPSY  09/02/2022   Procedure: BRONCHIAL BIOPSIES;  Surgeon: Brenna Adine CROME, DO;  Location: [facility];  Service: Pulmonary;;   BRONCHIAL BRUSHINGS  09/02/2022   Procedure: BRONCHIAL BRUSHINGS;  Surgeon: Brenna Adine CROME, DO;  Location: [facility];  Service: Pulmonary;;   BRONCHIAL NEEDLE ASPIRATION BIOPSY  09/02/2022   Procedure: BRONCHIAL NEEDLE ASPIRATION BIOPSIES;  Surgeon: Brenna Adine CROME, DO;  Location: [facility];  Service: Pulmonary;;   CHOLECYSTECTOMY     COLONOSCOPY  03/29/2003   COLONOSCOPY N/A 12/09/2013   Procedure: COLONOSCOPY;  Surgeon: Claudis PENNER Golda, MD;  [facility];  Service: Endoscopy;  Laterality: N/A;  730   COLONOSCOPY N/A 02/04/2017   Procedure: COLONOSCOPY;  Surgeon: Golda Claudis PENNER, MD;  [facility];  Service: Endoscopy;  Laterality: N/A;  930   ENDOBRONCHIAL ULTRASOUND Bilateral 09/02/2022   Procedure: ENDOBRONCHIAL ULTRASOUND;  Surgeon: Brenna Adine CROME, DO;  [facility];  Service: Pulmonary;  Laterality: Bilateral;   ESOPHAGEAL DILATION N/A 03/13/2021   Procedure: ESOPHAGEAL DILATION;  Surgeon: Golda Claudis PENNER, MD;  [facility];  Service: Endoscopy;  Laterality: N/A;   ESOPHAGOGASTRODUODENOSCOPY (EGD) WITH PROPOFOL  N/A 03/13/2021   Procedure: ESOPHAGOGASTRODUODENOSCOPY (EGD) WITH PROPOFOL ;  Surgeon: Golda Claudis PENNER, MD;  [facility];  Service: Endoscopy;  Laterality: N/A;  8:05   EYE SURGERY     bilateral cataract removal   INTERCOSTAL NERVE BLOCK Right 11/17/2022   Procedure: INTERCOSTAL NERVE BLOCK;  Surgeon: Shyrl Linnie KIDD, MD;  [facility]: [facility];  Service: Thoracic;  Laterality: Right;   IR KYPHO LUMBAR INC FX REDUCE BONE BX UNI/BIL CANNULATION INC/IMAGING  11/24/2019   IR KYPHO THORACIC WITH BONE BIOPSY  08/23/2019   IR KYPHO THORACIC WITH BONE BIOPSY  11/24/2019   IR RADIOLOGIST EVAL & MGMT  08/03/2019   IR  RADIOLOGIST EVAL & MGMT  09/29/2019   IR RADIOLOGIST EVAL & MGMT  10/18/2019   IR RADIOLOGIST EVAL & MGMT  04/11/2020   NODE DISSECTION Right 11/17/2022   Procedure: NODE DISSECTION;  Surgeon: Shyrl Linnie KIDD, MD;  [facility];  Service: Thoracic;  Laterality: Right;   SKIN CANCER EXCISION     RIGHT NECK     SPINE SURGERY     TONSILLECTOMY     T+A   TOTAL HIP ARTHROPLASTY Right 09/14/2012   Procedure: RIGHT TOTAL HIP ARTHROPLASTY ANTERIOR APPROACH and RIGHT KNEE STEROID INJECTION;  Surgeon: Lonni CINDERELLA Poli, MD;  Location: [facility];  Service: Orthopedics;  Laterality: Right;   TOTAL HIP ARTHROPLASTY Left 11/05/2012   Procedure: LEFT TOTAL HIP ARTHROPLASTY ANTERIOR APPROACH;  Surgeon: Lonni CINDERELLA Poli, MD;  Location: [facility];  Service: Orthopedics;  Laterality: Left;   UMBILICAL HERNIA REPAIR  04/26/2003   JENKINS   UPPER GASTROINTESTINAL ENDOSCOPY  08/03/2009   NUR   UPPER GASTROINTESTINAL ENDOSCOPY  03/29/2003   EGD ED TCS   No family history of bleeding disorders, wound healing problems or difficulty with anesthesia.  Social Connections: Moderately Isolated (10/01/2022)   Received from [facility]   Social Connection and Isolation Panel    In a typical week, how many times do you talk on the phone with family, friends, or neighbors?: More than three times a week    How often do you get together with friends or relatives?: Once a week    How often do you attend church or religious services?: Never    Do you belong to any clubs or organizations such as church groups, unions, fraternal or athletic groups, or school groups?: No    How often do you attend meetings of the clubs or organizations you belong to?: Never    Are you married, widowed, divorced, separated, never married, or living with a partner?: Married    Current Outpatient Medications:    albuterol  (PROVENTIL ) (2.5 MG/3ML) 0.083% nebulizer solution, Take 3 mLs (2.5 mg total) by nebulization every 6 (six)  hours as needed., Disp: 75 mL, Rfl: 12   albuterol  (VENTOLIN  HFA) 108 (90 Base) MCG/ACT inhaler, Inhale 2 puffs into the lungs every 6 (six) hours as needed for wheezing or shortness of breath., Disp: 18 g, Rfl: 11   allopurinol  (ZYLOPRIM ) 300 MG tablet, Take 1 tablet (300 mg total) by mouth daily., Disp: 90 tablet, Rfl: 1   ALPRAZolam  (XANAX ) 0.5 MG tablet, Take 0.5 mg by mouth 2 (two) times daily as needed for anxiety., Disp: , Rfl:    apixaban  (ELIQUIS ) 5 MG TABS tablet, TAKE 1 TABLET BY MOUTH TWICE A DAY, Disp: 60 tablet, Rfl: 5   APPLE CIDER VINEGAR PO, Take 625 mg by mouth daily., Disp: , Rfl:    Biotin  5000 MCG TABS, Take 5,000-10,000 mcg by mouth daily., Disp: , Rfl:    budesonide  (PULMICORT ) 0.25 MG/2ML nebulizer solution, Inhale 0.25 mg into the lungs., Disp: ,  Rfl:    budesonide -formoterol  (SYMBICORT ) 160-4.5 MCG/ACT inhaler, Inhale 2 puffs into the lungs 2 (two) times daily as needed., Disp: 10.2 g, Rfl: 1   calcium-vitamin D  250-100 MG-UNIT tablet, Take 1 tablet by mouth daily., Disp: , Rfl:    Cholecalciferol (VITAMIN D -3) 125 MCG (5000 UT) TABS, Take 5,000 Units by mouth daily., Disp: , Rfl:    cyanocobalamin  2000 MCG tablet, Take 5,000 mcg by mouth daily., Disp: , Rfl:    diclofenac  Sodium (VOLTAREN ) 1 % GEL, Apply 2-4 g topically 4 (four) times daily. APPLY 2-4 GRAMS TOPICALLY FOUR TIMES DAILY Strength: 1 %, Disp: 200 g, Rfl: 3   dorzolamide  (TRUSOPT ) 2 % ophthalmic solution, Place 1 drop into the right eye daily., Disp: , Rfl:    doxepin  (SINEQUAN ) 100 MG capsule, Take 1 capsule (100 mg total) by mouth at bedtime., Disp: 90 capsule, Rfl: 1   famotidine  (PEPCID ) 20 MG tablet, Take 20 mg by mouth., Disp: , Rfl:    gabapentin  (NEURONTIN ) 300 MG capsule, Take 300 mg by mouth 4 (four) times daily as needed (pain)., Disp: , Rfl:    IVERMECTIN PO, Take by mouth., Disp: , Rfl:    levocetirizine (XYZAL) 5 MG tablet, SMARTSIG:1 Tablet(s) By Mouth Every Evening, Disp: , Rfl:    lubiprostone   (AMITIZA ) 24 MCG capsule, TAKE 1 CAPSULE BY MOUTH TWICE A DAY WITH MEALS, Disp: 180 capsule, Rfl: 3   magnesium  oxide (MAG-OX) 400 (240 Mg) MG tablet, Take 400 mg by mouth every Monday, Wednesday, and Friday., Disp: , Rfl:    meloxicam  (MOBIC ) 15 MG tablet, Take 15 mg by mouth daily., Disp: , Rfl:    metoprolol  tartrate (LOPRESSOR ) 25 MG tablet, Take 0.5 tablets (12.5 mg total) by mouth daily as needed. If BP and HR is greater than 100 and for palpitations, Disp: 90 tablet, Rfl: 1   mometasone  (ELOCON ) 0.1 % cream, Apply topically daily as needed for itch, Disp: 15 g, Rfl: 3   omeprazole  (PRILOSEC) 40 MG capsule, Take 1 capsule by mouth once daily, Disp: 90 capsule, Rfl: 3   Potassium 99 MG TABS, Take 99 mg by mouth every Monday, Wednesday, and Friday., Disp: , Rfl:    predniSONE  (DELTASONE ) 10 MG tablet, For breathing or pain increase prednisone   to 10 mg x 2 until better then 1 daily x 5 days then one half, Disp: 100 tablet, Rfl: 0   senna (SENOKOT) 8.6 MG tablet, Take 5 tablets by mouth 2 (two) times daily., Disp: , Rfl:    Travoprost, BAK Free, (TRAVATAN) 0.004 % SOLN ophthalmic solution, Place 1 drop into both eyes at bedtime., Disp: , Rfl:  No current facility-administered medications for this visit.  Facility-Administered Medications Ordered in Other Visits:    regadenoson  (LEXISCAN ) injection SOLN 0.4 mg, 0.4 mg, Intravenous, Once, Hilty, Vinie BROCKS, MD   technetium tetrofosmin  (TC-MYOVIEW ) injection 31.4 millicurie, 31.4 millicurie, Intravenous, Once PRN, Hilty, Vinie BROCKS, MD A complete ROS was performed with pertinent positives/negatives noted in the HPI. The remainder of the ROS are negative.   Physical Exam:  BP 104/72 (BP Location: Left Arm, Patient Position: Sitting, Cuff Size: Normal)   Pulse (!) 103 Comment: pt had a hard time walking all the way to the room  Wt 159 lb (72.1 kg)   SpO2 93%   BMI 24.90 kg/m  General: Well developed, well nourished. No acute distress. Voice  hoarse, breathy Head/Face: Normocephalic. No sinus tenderness. Facial nerve intact and equal bilaterally. No facial lacerations. Eyes: PERRL,  no scleral icterus or conjunctival hemorrhage. EOMI. Ears: No gross deformity. Normal external canal. Tympanic membrane in tact bilaterally Hearing: Normal speech reception.  Nose: No gross deformity or lesions. No purulent discharge. No turbinate hypertrophy. Mouth/Oropharynx: Lips without any lesions. Dentition good. No mucosal lesions within the oropharynx. No tonsillar enlargement, exudate, or lesions. Pharyngeal walls symmetrical. Uvula midline. Tongue midline without lesions. Larynx: See TFL if applicable Nasopharynx: See TFL if applicable Neck: Trachea midline. No masses. No thyromegaly or nodules palpated. No crepitus. Lymphatic: No lymphadenopathy in the neck. Respiratory: No stridor or distress. Room air. Cardiovascular: Regular rate and rhythm. Extremities: No edema or cyanosis. Warm and well-perfused. Skin: No scars or lesions on face or neck. Neurologic: CN II-XII grossly intact. Moving all extremities without gross abnormality. Other:  Independent Review of Additional Tests or Records: None Procedures:  Preoperative diagnosis: hoarseness  Postoperative diagnosis:   same  Procedure: Flexible fiberoptic laryngoscopy with stroboscopy - CPT [phone]   Surgeon: Adah Malkin, DO  Anesthesia: 4% Topical lidocaine  and Afrin  Complications: None  Condition is stable throughout exam  Indications and consent:   The patient presents to the clinic with hoarseness. All the risks, benefits, and potential complications were reviewed with the patient preoperatively and informed verbal consent was obtained.  Procedure: The patient was seated upright in the exam chair.   Topical lidocaine  and Afrin were applied to the nasal cavity. After adequate anesthesia had occurred, the flexible telescope with strobe capabilities was passed into the nasal  cavity. The nasopharynx was patent without mass or lesion. The scope was passed behind the soft palate and directed toward the base of tongue. The base of tongue was visualized and was symmetric with no apparent masses or abnormal appearing tissue. There were no signs of a mass or pooling of secretions in the piriform sinuses. The supraglottic structures were normal.  The left true vocal cord is mobile. The right true vocal cord was immobile. The medial edges were straight and free of any lesions. Closure was incomplete. Periodicity present. The mucosal wave and amplitude were normal. There is mild interarytenoid pachydermia and post cricoid edema. The mucosa appears healthy.   The laryngoscope was then slowly withdrawn and the patient tolerated the procedure well. There were no complications or blood loss.   Impression & Plans:  Assessment & Plan Right vocal cord paralysis likely secondary to non-small cell lung cancer Dysphagia and dysphonia present with aspiration risk 2/2 persistent glottic gap - Schedule vocal cord injection with filler to improve vocal cord function and reduce aspiration risk. - Discussed procedure in detail with patient and partner - all questions answered to patient satisfaction - Will schedule in office injection   Plan for right vocal cord injection in office.  Adah Malkin, DO [facility]

## 2023-11-30 ENCOUNTER — Encounter: Payer: Self-pay | Admitting: Radiology

## 2023-12-07 ENCOUNTER — Ambulatory Visit (INDEPENDENT_AMBULATORY_CARE_PROVIDER_SITE_OTHER)

## 2023-12-07 ENCOUNTER — Encounter (INDEPENDENT_AMBULATORY_CARE_PROVIDER_SITE_OTHER): Admitting: Ophthalmology

## 2023-12-07 ENCOUNTER — Encounter (INDEPENDENT_AMBULATORY_CARE_PROVIDER_SITE_OTHER): Payer: Self-pay

## 2023-12-07 VITALS — BP 96/64 | HR 92 | Temp 98.0°F | Wt 159.0 lb

## 2023-12-07 DIAGNOSIS — H353211 Exudative age-related macular degeneration, right eye, with active choroidal neovascularization: Secondary | ICD-10-CM

## 2023-12-07 DIAGNOSIS — Z961 Presence of intraocular lens: Secondary | ICD-10-CM

## 2023-12-07 DIAGNOSIS — I10 Essential (primary) hypertension: Secondary | ICD-10-CM

## 2023-12-07 DIAGNOSIS — R490 Dysphonia: Secondary | ICD-10-CM

## 2023-12-07 DIAGNOSIS — H35033 Hypertensive retinopathy, bilateral: Secondary | ICD-10-CM

## 2023-12-07 DIAGNOSIS — H353122 Nonexudative age-related macular degeneration, left eye, intermediate dry stage: Secondary | ICD-10-CM

## 2023-12-07 DIAGNOSIS — J3801 Paralysis of vocal cords and larynx, unilateral: Secondary | ICD-10-CM

## 2023-12-07 NOTE — Progress Notes (Signed)
 Therapeutic Vocal Cord Injection CPT 31574 ATTENDING: Adah JONELLE Malkin, DO ASSISTANT: Reyes Cohen, PA   PREOPERATIVE DIAGNOSIS(ES): 1.right vocal cord paralysis 2. Hoarseness 3. Glottic insufficiency  POSTOPERATIVE DIAGNOSIS(ES): Same  PROCEDURE PERFORMED: Laryngoscopy with restylane injection into the right lateral thyroarytenoid muscle(s)  INDICATIONS FOR PROCEDURE: The risks and benefits of the surgical procedure have been explained in detail to the patient and they have elected to proceed.  CONSENT:  Informed consent was obtained prior to the procedure after discussion of risks, benefits, and alternatives and expected outcomes were discussed with the patient; consent placed in chart. The possibilities of reaction to medication, pulmonary aspiration, bleeding, infection, the need for additional procedures, failure to diagnose a condition, and creating a complication requiring transfusion or operation were discussed with the patient. The patient concurred with the proposed plan, giving informed consent.    UNIVERSAL PROTOCOL/ TIMEOUT: Preprocedure verification is complete- patient verified and consents confirmed.  ANESTHESIA: local anesthesia  H&P REVIEW: The patient's history and physical were reviewed today prior to procedure. All medications were reviewed and updated as well.  PROCEDURE DETAILS:  The patient was brought to the clinic and placed in a seated position. The anterior neck skin was then cleansed with alcohol  and 1% Lidocaine  was used to infiltrate the skin overlying the thyrohyoid membrane. Patient had a NEB with 4% lidocaine  prior the start of the procedure, to ensure good anesthesia and procedure tolerance. Afrin/Lidocaine  mixture was then used to anesthetize the nasal passages. The anterior neck was anesthetized with 1% lidocaine  with 1:100 000 Epinephrine . The flexible laryngoscope was then passed through the patient's nasal passageway and advanced into the larynx. The  nasopharynx was free of any lesions. The scope was passed behind the soft palate and directed toward the base of tongue. The supraglottic structures were normal in appearance. The left true fold showed atrophy and right vocal fold paralysis. The working channel of the scope was used and 4% lidocaine  was dripped on the cords while the patient phonated. A 23 g needle with two bends was advanced through the thyrohyoid membrane and advanced into the vocal fold, and injection was performed into the right thyroarytenoid muscle(s) at a site just anterior to the vocal process. The augmentation carried out until some overmedialization was noted. The needle was then removed from the vocal fold and the airway. The scope was removed from the nasal cavity. This completed the procedure. The patient tolerated the procedure well.  IMPLANTS: Restylane-L Hyaluronic Acid  ESTIMATED BLOOD LOSS: None  SPECIMEN(S) REMOVED: None  DISPOSITION OF SPECIMEN(S): NA.  FINDINGS:  No evidence of a hematoma and the airway remained patent  CONDITION: Stable  COMPLICATIONS:The patient tolerated the procedure well without apparent complications and was ambulatory.   PLAN: RTC 3 weeks for repeat videostrobe

## 2023-12-07 NOTE — Patient Instructions (Signed)
 HYALURONIC ACID (RESTYLANE) INJECTION TO THE VOCAL CORD What is Hyaluronic Acid? Hyaluronic Acid is a synthetic substance. It contains a substance that is found in our subcutaneous tissue, or the soft tissue just beneath our skin. It has also been used in plastic surgery to fill in wrinkles or plump lips.    Hyaluronic Acid and the Vocal Cords Vocal cord problems are usually due to a lack of movement, bowing (loss of muscle) or weakness of the vocal cords. This lack of motion makes speech difficult. A lack of vocal cord motion can also cause:  Low, raspy or rough voice  Constant hoarseness  Breathing difficulty  Inability to speak above a whisper  Coughing or choking when swallowing Hyaluronic Acid increases the size or mass of the vocal cord where muscle loss has occurred. It acts like a space filler. Hyaluronic Acid is injected next to the vocal cords and into the muscles supporting the vocal cords. The body slowly reabsorbs it over time, and its effects usually last about 4-6 months, although this range varies for each patient.  Hyaluronic Acid Treatment You will be awake during the treatment and able to talk and breathe normally. Numbing medicines are used before the procedure to decrease the discomfort. The treatment will take about 5-10 minutes.    You should not eat a big meal before the injection.  In the office: You will sit up in a chair for the injection. A fiberoptic camera called a nasoendoscope is placed in a nostril and will go down to the base of your throat. This will allow the doctor to see your vocal cords. An injection of Hyaluronic Acid can be given in several different ways, including via your mouth, directly into the surface of the vocal cord, or through the skin near your "Adam's apple" on your throat. If it is given through the skin, the lidocaine  is injected into the skin to numb it before the injection.  If it is given via the mouth, the lining of the throat is numbed  with liquid lidocaine  first.    Risks include (but are not limited to) swelling, infection or allergic reaction, pain, extravasation into a more superficial layer of the vocal cord (making the voice more strained), inflammation, or failure to improve the voice. Rarely, additional procedures are needed to address these complications.  The voice is usually strained for a few days, then settles in by 4-7 days. Some patients may sound very good after the injection but find that their voice fades quickly, much earlier than the usual 4-6 months. These patients may benefit from a second injection right away, and they should call the office.  If you have any trouble breathing or severe pain with swallowing or talking, you should call the office right away. It is not unusual to have a sore throat for a few days afterward, but if this soreness or pain is severe, you should call the office. You should always call the office for any questions or issues.  After a Hyaluronic Acid Treatment  You can talk right after your treatment however try to rest your voice for the first 24 hours  If you had the procedure done in the office, you should not eat or drink for 1 hour, or until the sensation in your throat completely returns to normal  You may have some discomfort or pain that radiates from your throat to your ear.  You will follow up with your doctor as instructed.  The Hyaluronic Acid procedure  results can last from 3-6 months, but this is different for every patient.

## 2023-12-08 NOTE — Progress Notes (Signed)
 [facility] - Clinic Note  12/21/2023   CHIEF COMPLAINT Patient presents for Retina Follow Up  HISTORY OF PRESENT ILLNESS: Zoe [name] is a 88 y.o. female who presents to the clinic today for:  HPI     Retina Follow Up   Patient presents with  Wet AMD.  In right eye.  Severity is moderate.  Duration of 5 weeks.  Since onset it is stable.  I, the attending physician,  performed the HPI with the patient and updated documentation appropriately.        Comments   5 week Retina eval. Patient states not much change in vision      Last edited by Valdemar Rogue, MD on 12/21/2023  2:02 PM.     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 She is currently being treated for cancer.   Referring physician: Fleeta Zerita DASEN, MD [address] [place],  [place] [address]  HISTORICAL INFORMATION:  Selected notes from the MEDICAL RECORD NUMBER Referred by Dr. Fleeta for concern of exu ARMD OD LEE:  Ocular Hx- PMH-   CURRENT MEDICATIONS: Current Outpatient Medications (Ophthalmic Drugs)  Medication Sig   dorzolamide  (TRUSOPT ) 2 % ophthalmic solution Place 1 drop into the right eye daily.   Travoprost, BAK Free, (TRAVATAN) 0.004 % SOLN ophthalmic solution Place 1 drop into both eyes at bedtime.   No current facility-administered medications for this visit. (Ophthalmic Drugs)   Current Outpatient Medications (Other)  Medication Sig   albuterol  (PROVENTIL ) (2.5 MG/3ML) 0.083% nebulizer solution Take 3 mLs (2.5 mg total) by nebulization every 6 (six) hours as needed.   albuterol  (VENTOLIN  HFA) 108 (90 Base) MCG/ACT inhaler Inhale 2 puffs into the lungs every 6 (six) hours as needed for wheezing or shortness of breath.   allopurinol  (ZYLOPRIM ) 300 MG tablet Take 1 tablet (300 mg total) by mouth daily.   ALPRAZolam  (XANAX ) 0.5 MG tablet Take 0.5 mg by mouth 2 (two) times daily as needed for anxiety.   apixaban  (ELIQUIS ) 5 MG TABS tablet  TAKE 1 TABLET BY MOUTH TWICE A DAY   APPLE CIDER VINEGAR PO Take 625 mg by mouth daily.   Biotin  5000 MCG TABS Take 5,000-10,000 mcg by mouth daily.   budesonide  (PULMICORT ) 0.25 MG/2ML nebulizer solution Inhale 0.25 mg into the lungs.   budesonide -formoterol  (SYMBICORT ) 160-4.5 MCG/ACT inhaler Inhale 2 puffs into the lungs 2 (two) times daily as needed.   calcium-vitamin D  250-100 MG-UNIT tablet Take 1 tablet by mouth daily.   Cholecalciferol (VITAMIN D -3) 125 MCG (5000 UT) TABS Take 5,000 Units by mouth daily.   cyanocobalamin  2000 MCG tablet Take 5,000 mcg by mouth daily.   diclofenac  Sodium (VOLTAREN ) 1 % GEL Apply 2-4 g topically 4 (four) times daily. APPLY 2-4 GRAMS TOPICALLY FOUR TIMES DAILY Strength: 1 %   doxepin  (SINEQUAN ) 100 MG capsule Take 1 capsule (100 mg total) by mouth at bedtime.   famotidine  (PEPCID ) 20 MG tablet Take 20 mg by mouth.   gabapentin  (NEURONTIN ) 300 MG capsule Take 300 mg by mouth 4 (four) times daily as needed (pain).   IVERMECTIN PO Take by mouth.   levocetirizine (XYZAL) 5 MG tablet SMARTSIG:1 Tablet(s) By Mouth Every Evening   lubiprostone  (AMITIZA ) 24 MCG capsule TAKE 1 CAPSULE BY MOUTH TWICE A DAY WITH MEALS   magnesium  oxide (MAG-OX) 400 (240 Mg) MG tablet Take 400 mg by mouth every Monday, Wednesday, and Friday.   meloxicam  (MOBIC ) 15  MG tablet Take 15 mg by mouth daily.   metoprolol  tartrate (LOPRESSOR ) 25 MG tablet Take 0.5 tablets (12.5 mg total) by mouth daily as needed. If BP and HR is greater than 100 and for palpitations   mometasone  (ELOCON ) 0.1 % cream Apply topically daily as needed for itch   omeprazole  (PRILOSEC) 40 MG capsule Take 1 capsule by mouth once daily   Potassium 99 MG TABS Take 99 mg by mouth every Monday, Wednesday, and Friday.   predniSONE  (DELTASONE ) 10 MG tablet For breathing or pain increase prednisone   to 10 mg x 2 until better then 1 daily x 5 days then one half   senna (SENOKOT) 8.6 MG tablet Take 5 tablets by mouth 2 (two)  times daily.   No current facility-administered medications for this visit. (Other)   Facility-Administered Medications Ordered in Other Visits (Other)  Medication Route   regadenoson  (LEXISCAN ) injection SOLN 0.4 mg Intravenous   technetium tetrofosmin  (TC-MYOVIEW ) injection 31.4 millicurie Intravenous   REVIEW OF SYSTEMS: ROS   Positive for: Eyes, Respiratory, Allergic/Imm Negative for: Constitutional, Gastrointestinal, Neurological, Skin, Genitourinary, Musculoskeletal, HENT, Endocrine, Cardiovascular, Psychiatric, Heme/Lymph Last edited by German Olam BRAVO, COT on 12/21/2023  1:44 PM.      ALLERGIES Allergies  Allergen Reactions   Codeine Other (See Comments)    Pains in abdominal area   Other     NOVACAINE     ITCHING Anti-depressants   Oxycodone      Abdominal pain, chest pain   Paroxetine Other (See Comments)   Paxil [Paroxetine Hcl] Itching   Procaine Itching   PAST MEDICAL HISTORY Past Medical History:  Diagnosis Date   A-fib [facility])    pt went into A-fib during lung biopsies   Abdominal discomfort 11/02/2012   suspected ?UTI- PCP MD placed on Cipro    Anxiety    Arthritis    Asthma    Cancer (HCC)    skin CA   Choking due to phlegm    Chronic constipation    Depression    Dysrhythmia    tachycardia   Environmental allergies    Fibromyalgia    GERD (gastroesophageal reflux disease)    Hemorrhoids    Hiatal hernia    Hypercholesteremia    Hypertension    Sleep apnea    Past Surgical History:  Procedure Laterality Date   ANAL RECTAL MANOMETRY N/A 11/28/2013   Procedure: ANO RECTAL MANOMETRY;  Surgeon: Bernarda Ned, MD;  Location: [facility];  Service: Endoscopy;  Laterality: N/A;   APPENDECTOMY     BACK SURGERY     X2    BIOPSY  03/13/2021   Procedure: BIOPSY;  Surgeon: Golda Claudis PENNER, MD;  Location: [facility];  Service: Endoscopy;;   BRONCHIAL BIOPSY  09/02/2022   Procedure: BRONCHIAL BIOPSIES;  Surgeon: Brenna Adine CROME, DO;  Location: [facility];  Service: Pulmonary;;   BRONCHIAL BRUSHINGS  09/02/2022   Procedure: BRONCHIAL BRUSHINGS;  Surgeon: Brenna Adine CROME, DO;  Location: [facility];  Service: Pulmonary;;   BRONCHIAL NEEDLE ASPIRATION BIOPSY  09/02/2022   Procedure: BRONCHIAL NEEDLE ASPIRATION BIOPSIES;  Surgeon: Brenna Adine CROME, DO;  Location: [facility];  Service: Pulmonary;;   CHOLECYSTECTOMY     COLONOSCOPY  03/29/2003   COLONOSCOPY N/A 12/09/2013   Procedure: COLONOSCOPY;  Surgeon: Claudis PENNER Golda, MD;  [facility];  Service: Endoscopy;  Laterality: N/A;  730   COLONOSCOPY N/A 02/04/2017   Procedure: COLONOSCOPY;  Surgeon: Golda Claudis PENNER, MD;  [facility];  Service: Endoscopy;  Laterality: N/A;  930   ENDOBRONCHIAL ULTRASOUND Bilateral 09/02/2022   Procedure: ENDOBRONCHIAL ULTRASOUND;  Surgeon: Brenna Adine CROME, DO;  [facility];  Service: Pulmonary;  Laterality: Bilateral;   ESOPHAGEAL DILATION N/A 03/13/2021   Procedure: ESOPHAGEAL DILATION;  Surgeon: Golda Claudis PENNER, MD;  [facility];  Service: Endoscopy;  Laterality: N/A;   ESOPHAGOGASTRODUODENOSCOPY (EGD) WITH PROPOFOL  N/A 03/13/2021   Procedure: ESOPHAGOGASTRODUODENOSCOPY (EGD) WITH PROPOFOL ;  Surgeon: Golda Claudis PENNER, MD;  [facility];  Service: Endoscopy;  Laterality: N/A;  8:05   EYE SURGERY     bilateral cataract removal   INTERCOSTAL NERVE BLOCK Right 11/17/2022   Procedure: INTERCOSTAL NERVE BLOCK;  Surgeon: Shyrl Linnie KIDD, MD;  Location: [facility];  Service: Thoracic;  Laterality: Right;   IR KYPHO LUMBAR INC FX REDUCE BONE BX UNI/BIL CANNULATION INC/IMAGING  11/24/2019   IR KYPHO THORACIC WITH BONE BIOPSY  08/23/2019   IR KYPHO THORACIC WITH BONE BIOPSY  11/24/2019   IR RADIOLOGIST EVAL & MGMT  08/03/2019   IR RADIOLOGIST EVAL & MGMT  09/29/2019   IR RADIOLOGIST EVAL & MGMT  10/18/2019   IR RADIOLOGIST EVAL & MGMT  04/11/2020   NODE DISSECTION Right 11/17/2022   Procedure:  NODE DISSECTION;  Surgeon: Shyrl Linnie KIDD, MD;  Location: [facility];  Service: Thoracic;  Laterality: Right;   SKIN CANCER EXCISION     RIGHT NECK     SPINE SURGERY     TONSILLECTOMY     T+A   TOTAL HIP ARTHROPLASTY Right 09/14/2012   Procedure: RIGHT TOTAL HIP ARTHROPLASTY ANTERIOR APPROACH and RIGHT KNEE STEROID INJECTION;  Surgeon: Lonni CINDERELLA Poli, MD;  Location: [facility];  Service: Orthopedics;  Laterality: Right;   TOTAL HIP ARTHROPLASTY Left 11/05/2012   Procedure: LEFT TOTAL HIP ARTHROPLASTY ANTERIOR APPROACH;  Surgeon: Lonni CINDERELLA Poli, MD;  Location: [facility];  Service: Orthopedics;  Laterality: Left;   UMBILICAL HERNIA REPAIR  04/26/2003   JENKINS   UPPER GASTROINTESTINAL ENDOSCOPY  08/03/2009   NUR   UPPER GASTROINTESTINAL ENDOSCOPY  03/29/2003   EGD ED TCS   FAMILY HISTORY Family History  Problem Relation Age of Onset   Healthy Son    Healthy Son    Healthy Daughter    Colon cancer Neg Hx    SOCIAL HISTORY Social History   Tobacco Use   Smoking status: Former    Current packs/day: 0.00    Average packs/day: 0.5 packs/day for 50.0 years (25.0 ttl pk-yrs)    Types: Cigarettes    Start date: 09/07/1970    Quit date: 09/06/2020    Years since quitting: 3.2   Smokeless tobacco: Never  Vaping Use   Vaping status: Never Used  Substance Use Topics   Alcohol  use: Yes    Comment: 1-2 drinks per week   Drug use: No       OPHTHALMIC EXAM:  Base Eye Exam     Visual Acuity (Snellen - Linear)       Right Left   Dist [place] 20/150 -2 20/25 -3   Dist ph [place] 20/NI 20/NI         Tonometry (Tonopen, 1:46 PM)       Right Left   Pressure 7 9         Pupils       Dark Light Shape React APD   Right 3 2 Round Brisk None   Left 3 2 Round Brisk None  Visual Fields (Counting fingers)       Left Right    Full Full         Extraocular Movement       Right Left    Full, Ortho Full, Ortho         Neuro/Psych     Oriented x3: Yes    Mood/Affect: Normal         Dilation     Both eyes: 1.0% Mydriacyl, 2.5% Phenylephrine  @ 1:47 PM           Slit Lamp and Fundus Exam     Slit Lamp Exam       Right Left   Lids/Lashes Dermatochalasis - upper lid Dermatochalasis - upper lid   Conjunctiva/Sclera White and quiet White and quiet   Cornea well healed cataract wound, trace PEE, mild tear film debris mild arcus, mild tear film debris, well healed cataract wound   Anterior Chamber deep and clear deep and clear   Iris Round and dilated Round and dilated   Lens 3 piece posterior chamber intraocular lens with mild temporal displacement 3 piece posterior chamber intraocular lens in good position   Anterior Vitreous mild syneresis mild syneresis         Fundus Exam       Right Left   Disc mild Pallor, Sharp rim, temporal PPA mild Pallor, Sharp rim, temporal PPA   C/D Ratio 0.3 0.3   Macula Blunted foveal reflex, central CNV with +heme and edema -- heme stably resolved and edema increased, Drusen, RPE mottling, RPE rip with atrophy, central SRF stably improved Flat, Blunted foveal reflex, Drusen, RPE mottling and clumping, No heme or edema   Vessels attenuated, Tortuous attenuated, Tortuous   Periphery Attached, pigmented paving stone degeneration inferiorly, No heme Attached, reticular degeneration, No heme           IMAGING AND PROCEDURES  Imaging and Procedures for 12/21/2023  OCT, Retina - OU - Both Eyes       Right Eye Quality was good. Central Foveal Thickness: 436. Progression has worsened. Findings include no IRF, no SRF, abnormal foveal contour, retinal drusen , subretinal hyper-reflective material, choroidal neovascular membrane, intraretinal fluid, pigment epithelial detachment (Interval increase in central PED / [facility] and IRF / cystic changes overlying, RPE rip, SRF stably improved).   Left Eye Quality was good. Central Foveal Thickness: 229. Progression has been stable. Findings include normal  foveal contour, no IRF, no SRF, retinal drusen , pigment epithelial detachment, outer retinal atrophy (Mild patchy ORA).   Notes *Images captured and stored on drive  Diagnosis / Impression:  OD: Interval increase in central PED / [facility] and IRF / cystic changes overlying, RPE rip, SRF stably improved OS: nonexudative ARMD w/ patchy ORA  Clinical management:  See below  Abbreviations: NFP - Normal foveal profile. CME - cystoid macular edema. PED - pigment epithelial detachment. IRF - intraretinal fluid. SRF - subretinal fluid. EZ - ellipsoid zone. ERM - epiretinal membrane. ORA - outer retinal atrophy. ORT - outer retinal tubulation. SRHM - subretinal hyper-reflective material. IRHM - intraretinal hyper-reflective material      Intravitreal Injection, Pharmacologic Agent - OD - Right Eye       Time Out 12/21/2023. 1:50 PM. Confirmed correct patient, procedure, site, and patient consented.   Anesthesia Topical anesthesia was used. Anesthetic medications included Lidocaine  2%, Proparacaine 0.5%.   Procedure Preparation included 5% betadine to ocular surface, eyelid speculum. A (32g) needle was used.   Injection:  6 mg faricimab -svoa 6 MG/0.05ML (Patient supplied)   Route: Intravitreal, Site: Right Eye   NDC: 49757-903-98, Lot: [id number], Expiration date: 10/26/2025, Waste: 0 mL   Post-op Post injection exam found visual acuity of at least counting fingers. The patient tolerated the procedure well. There were no complications. The patient received written and verbal post procedure care education.   Notes **SAMPLE MEDICATION ADMINISTERED**          ASSESSMENT/PLAN:   ICD-10-CM   1. Exudative age-related macular degeneration of right eye with active choroidal neovascularization (HCC)  H35.3211 OCT, Retina - OU - Both Eyes    Intravitreal Injection, Pharmacologic Agent - OD - Right Eye    faricimab -svoa (VABYSMO ) 6mg /0.9mL intravitreal injection    2. Intermediate stage  nonexudative age-related macular degeneration of left eye  H35.3122     3. Essential hypertension  I10     4. Hypertensive retinopathy of both eyes  H35.033     5. Pseudophakia, both eyes  Z96.1      1. 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, OD - s/p IVA OD #1 (01.28.25), #2 (01.28.25), #3 (03.25.25), #4 (06.18.2025)  -- IVA resistance ============== - s/p IVV OD #1 (04.21.25 sample), #2 (05.21.25 sample), #3 (06.18.25 sample), #4 (07.18.25 sample), #5 (09.09.25 sample), #6 (sample 10.08.25)   - exam shows central CNV w/ +heme -- improved  - BCVA OD 20/60 - OCT shows Interval increase in central PED / [facility] and IRF / cystic changes overlying, RPE rip, SRF stably improved at 6 wks - recommend IVV today OD #7 (sample 11.24.25) w/ f/u in 6 wks - pt wishes to be treated with IVV - RBA of procedure discussed, questions answered - IVV informed consent obtained and signed, 04.23.25 - see procedure note  - f/u in 6wks -- DFE/OCT, possible injection   2. Age related macular degeneration, non-exudative, OS - The incidence, anatomy, and pathology of dry AMD, risk of progression, and the AREDS and AREDS 2 study including smoking risks discussed with patient.  - Recommend amsler grid monitoring  - monitor  3,4. Hypertensive retinopathy OU - discussed importance of tight BP control - monitor  5. Pseudophakia OU  - s/p CE/IOL (Dr. Alverta)  - IOL in good position, doing well  - monitor  Ophthalmic Meds Ordered this visit:  Meds ordered this encounter  Medications   faricimab -svoa (VABYSMO ) 6mg /0.58mL intravitreal injection     Return in about 6 weeks (around 02/01/2024) for f/u, Ex. AMD, DFE, OCT, Possible, IVV, OD.  There are no Patient Instructions on file for this visit.  This document serves as a record of services personally performed by Redell JUDITHANN Hans, MD, PhD. It was created on their behalf by Avelina Pereyra, COA an ophthalmic technician. The creation of this record is the  provider's dictation and/or activities during the visit.   Electronically signed by: Avelina GORMAN Pereyra, COT  12/22/23  8:12 AM   This document serves as a record of services personally performed by Redell JUDITHANN Hans, MD, PhD. It was created on their behalf by Wanda GEANNIE Keens, COT an ophthalmic technician. The creation of this record is the provider's dictation and/or activities during the visit.    Electronically signed by:  Wanda GEANNIE Keens, COT  12/22/23 8:12 AM  Redell JUDITHANN Hans, M.D., Ph.D. Diseases & Surgery of the Retina and Vitreous [facility]  I have reviewed the above documentation for accuracy and completeness, and I agree with the above. Redell JUDITHANN Hans, M.D.,  Ph.D. 12/22/23 8:13 AM   Abbreviations: M myopia (nearsighted); A astigmatism; H hyperopia (farsighted); P presbyopia; Mrx spectacle prescription;  CTL contact lenses; OD right eye; OS left eye; OU both eyes  XT exotropia; ET esotropia; PEK punctate epithelial keratitis; PEE punctate epithelial erosions; DES dry eye syndrome; MGD meibomian gland dysfunction; ATs artificial tears; PFAT's preservative free artificial tears; NSC nuclear sclerotic cataract; PSC posterior subcapsular cataract; ERM epi-retinal membrane; PVD posterior vitreous detachment; RD retinal detachment; DM diabetes mellitus; DR diabetic retinopathy; NPDR non-proliferative diabetic retinopathy; PDR proliferative diabetic retinopathy; CSME clinically significant macular edema; DME diabetic macular edema; dbh dot blot hemorrhages; CWS cotton wool spot; POAG primary open angle glaucoma; C/D cup-to-disc ratio; HVF humphrey visual field; GVF goldmann visual field; OCT optical coherence tomography; IOP intraocular pressure; BRVO Branch retinal vein occlusion; CRVO central retinal vein occlusion; CRAO central retinal artery occlusion; BRAO branch retinal artery occlusion; RT retinal tear; SB scleral buckle; PPV pars plana vitrectomy; VH Vitreous  hemorrhage; PRP panretinal laser photocoagulation; IVK intravitreal kenalog ; VMT vitreomacular traction; MH Macular hole;  NVD neovascularization of the disc; NVE neovascularization elsewhere; AREDS age related eye disease study; ARMD age related macular degeneration; POAG primary open angle glaucoma; EBMD epithelial/anterior basement membrane dystrophy; ACIOL anterior chamber intraocular lens; IOL intraocular lens; PCIOL posterior chamber intraocular lens; Phaco/IOL phacoemulsification with intraocular lens placement; PRK photorefractive keratectomy; LASIK laser assisted in situ keratomileusis; HTN hypertension; DM diabetes mellitus; COPD chronic obstructive pulmonary disease

## 2023-12-09 ENCOUNTER — Encounter (INDEPENDENT_AMBULATORY_CARE_PROVIDER_SITE_OTHER): Admitting: Ophthalmology

## 2023-12-16 ENCOUNTER — Ambulatory Visit: Admitting: Internal Medicine

## 2023-12-21 ENCOUNTER — Encounter (INDEPENDENT_AMBULATORY_CARE_PROVIDER_SITE_OTHER): Payer: Self-pay | Admitting: Ophthalmology

## 2023-12-21 ENCOUNTER — Ambulatory Visit (INDEPENDENT_AMBULATORY_CARE_PROVIDER_SITE_OTHER): Admitting: Ophthalmology

## 2023-12-21 DIAGNOSIS — H353122 Nonexudative age-related macular degeneration, left eye, intermediate dry stage: Secondary | ICD-10-CM

## 2023-12-21 DIAGNOSIS — H353211 Exudative age-related macular degeneration, right eye, with active choroidal neovascularization: Secondary | ICD-10-CM

## 2023-12-21 DIAGNOSIS — I10 Essential (primary) hypertension: Secondary | ICD-10-CM

## 2023-12-21 DIAGNOSIS — Z961 Presence of intraocular lens: Secondary | ICD-10-CM

## 2023-12-21 DIAGNOSIS — H35033 Hypertensive retinopathy, bilateral: Secondary | ICD-10-CM | POA: Diagnosis not present

## 2023-12-21 MED ORDER — FARICIMAB-SVOA 6 MG/0.05ML IZ SOLN
6.0000 mg | INTRAVITREAL | Status: AC | PRN
  Administered 2023-12-21: 6 mg via INTRAVITREAL

## 2023-12-30 ENCOUNTER — Encounter (INDEPENDENT_AMBULATORY_CARE_PROVIDER_SITE_OTHER): Payer: Self-pay

## 2023-12-30 ENCOUNTER — Ambulatory Visit (INDEPENDENT_AMBULATORY_CARE_PROVIDER_SITE_OTHER)

## 2023-12-31 ENCOUNTER — Ambulatory Visit (INDEPENDENT_AMBULATORY_CARE_PROVIDER_SITE_OTHER)

## 2023-12-31 VITALS — BP 99/66 | HR 87 | Temp 97.7°F | Wt 158.0 lb

## 2023-12-31 DIAGNOSIS — J3801 Paralysis of vocal cords and larynx, unilateral: Secondary | ICD-10-CM

## 2023-12-31 DIAGNOSIS — R490 Dysphonia: Secondary | ICD-10-CM

## 2023-12-31 NOTE — Progress Notes (Unsigned)
 HPI:   Discussed the use of AI scribe software for clinical note transcription with the patient, who gave verbal consent to proceed.  History of Present Illness Zoe [name] is an 88 year old female with stage four non-small cell lung cancer who presents in follow-up after vocal cord injection in office on 12/07/2023. She is here with her husband. She denies improvement in her dysphagia after the injection. Her husband states that others and himself have noticed improvement in her voice since the injection. She denies any improvement in her voice. She has not attended speech therapy.     PMH/Meds/All/SocHx/FamHx/ROS: Past Medical History:  Diagnosis Date   A-fib [facility])    pt went into A-fib during lung biopsies   Abdominal discomfort 11/02/2012   suspected ?UTI- PCP MD placed on Cipro    Anxiety    Arthritis    Asthma    Cancer (HCC)    skin CA   Choking due to phlegm    Chronic constipation    Depression    Dysrhythmia    tachycardia   Environmental allergies    Fibromyalgia    GERD (gastroesophageal reflux disease)    Hemorrhoids    Hiatal hernia    Hypercholesteremia    Hypertension    Sleep apnea    Past Surgical History:  Procedure Laterality Date   ANAL RECTAL MANOMETRY N/A 11/28/2013   Procedure: ANO RECTAL MANOMETRY;  Surgeon: Bernarda Ned, MD;  Location: [facility];  Service: Endoscopy;  Laterality: N/A;   APPENDECTOMY     BACK SURGERY     X2    BIOPSY  03/13/2021   Procedure: BIOPSY;  Surgeon: Golda Claudis PENNER, MD;  Location: [facility];  Service: Endoscopy;;   BRONCHIAL BIOPSY  09/02/2022   Procedure: BRONCHIAL BIOPSIES;  Surgeon: Brenna Adine CROME, DO;  Location: [facility];  Service: Pulmonary;;   BRONCHIAL BRUSHINGS  09/02/2022   Procedure: BRONCHIAL BRUSHINGS;  Surgeon: Brenna Adine CROME, DO;  Location: [facility];  Service: Pulmonary;;   BRONCHIAL NEEDLE ASPIRATION BIOPSY  09/02/2022   Procedure: BRONCHIAL NEEDLE ASPIRATION BIOPSIES;   Surgeon: Brenna Adine CROME, DO;  Location: [facility];  Service: Pulmonary;;   CHOLECYSTECTOMY     COLONOSCOPY  03/29/2003   COLONOSCOPY N/A 12/09/2013   Procedure: COLONOSCOPY;  Surgeon: Claudis PENNER Golda, MD;  [facility];  Service: Endoscopy;  Laterality: N/A;  730   COLONOSCOPY N/A 02/04/2017   Procedure: COLONOSCOPY;  Surgeon: Golda Claudis PENNER, MD;  [facility];  Service: Endoscopy;  Laterality: N/A;  930   ENDOBRONCHIAL ULTRASOUND Bilateral 09/02/2022   Procedure: ENDOBRONCHIAL ULTRASOUND;  Surgeon: Brenna Adine CROME, DO;  [facility];  Service: Pulmonary;  Laterality: Bilateral;   ESOPHAGEAL DILATION N/A 03/13/2021   Procedure: ESOPHAGEAL DILATION;  Surgeon: Golda Claudis PENNER, MD;  [facility];  Service: Endoscopy;  Laterality: N/A;   ESOPHAGOGASTRODUODENOSCOPY (EGD) WITH PROPOFOL  N/A 03/13/2021   Procedure: ESOPHAGOGASTRODUODENOSCOPY (EGD) WITH PROPOFOL ;  Surgeon: Golda Claudis PENNER, MD;  [facility];  Service: Endoscopy;  Laterality: N/A;  8:05   EYE SURGERY     bilateral cataract removal   INTERCOSTAL NERVE BLOCK Right 11/17/2022   Procedure: INTERCOSTAL NERVE BLOCK;  Surgeon: Shyrl Linnie KIDD, MD;  Location: [facility];  Service: Thoracic;  Laterality: Right;   IR KYPHO LUMBAR INC FX REDUCE BONE BX UNI/BIL CANNULATION INC/IMAGING  11/24/2019   IR KYPHO THORACIC WITH BONE BIOPSY  08/23/2019   IR KYPHO THORACIC WITH  BONE BIOPSY  11/24/2019   IR RADIOLOGIST EVAL & MGMT  08/03/2019   IR RADIOLOGIST EVAL & MGMT  09/29/2019   IR RADIOLOGIST EVAL & MGMT  10/18/2019   IR RADIOLOGIST EVAL & MGMT  04/11/2020   NODE DISSECTION Right 11/17/2022   Procedure: NODE DISSECTION;  Surgeon: Shyrl Linnie KIDD, MD;  Location: [facility];  Service: Thoracic;  Laterality: Right;   SKIN CANCER EXCISION     RIGHT NECK     SPINE SURGERY     TONSILLECTOMY     T+A   TOTAL HIP ARTHROPLASTY Right 09/14/2012   Procedure: RIGHT TOTAL HIP ARTHROPLASTY  ANTERIOR APPROACH and RIGHT KNEE STEROID INJECTION;  Surgeon: Lonni CINDERELLA Poli, MD;  Location: [facility];  Service: Orthopedics;  Laterality: Right;   TOTAL HIP ARTHROPLASTY Left 11/05/2012   Procedure: LEFT TOTAL HIP ARTHROPLASTY ANTERIOR APPROACH;  Surgeon: Lonni CINDERELLA Poli, MD;  Location: [facility];  Service: Orthopedics;  Laterality: Left;   UMBILICAL HERNIA REPAIR  04/26/2003   JENKINS   UPPER GASTROINTESTINAL ENDOSCOPY  08/03/2009   NUR   UPPER GASTROINTESTINAL ENDOSCOPY  03/29/2003   EGD ED TCS   No family history of bleeding disorders, wound healing problems or difficulty with anesthesia.  Social Connections: Moderately Isolated (10/01/2022)   Received from [facility]   Social Connection and Isolation Panel    In a typical week, how many times do you talk on the phone with family, friends, or neighbors?: More than three times a week    How often do you get together with friends or relatives?: Once a week    How often do you attend church or religious services?: Never    Do you belong to any clubs or organizations such as church groups, unions, fraternal or athletic groups, or school groups?: No    How often do you attend meetings of the clubs or organizations you belong to?: Never    Are you married, widowed, divorced, separated, never married, or living with a partner?: Married    Current Outpatient Medications:    albuterol  (PROVENTIL ) (2.5 MG/3ML) 0.083% nebulizer solution, Take 3 mLs (2.5 mg total) by nebulization every 6 (six) hours as needed., Disp: 75 mL, Rfl: 12   albuterol  (VENTOLIN  HFA) 108 (90 Base) MCG/ACT inhaler, Inhale 2 puffs into the lungs every 6 (six) hours as needed for wheezing or shortness of breath., Disp: 18 g, Rfl: 11   allopurinol  (ZYLOPRIM ) 300 MG tablet, Take 1 tablet (300 mg total) by mouth daily., Disp: 90 tablet, Rfl: 1   ALPRAZolam  (XANAX ) 0.5 MG tablet, Take 0.5 mg by mouth 2 (two) times daily as needed for anxiety., Disp: , Rfl:    apixaban   (ELIQUIS ) 5 MG TABS tablet, TAKE 1 TABLET BY MOUTH TWICE A DAY, Disp: 60 tablet, Rfl: 5   APPLE CIDER VINEGAR PO, Take 625 mg by mouth daily., Disp: , Rfl:    Biotin  5000 MCG TABS, Take 5,000-10,000 mcg by mouth daily., Disp: , Rfl:    budesonide  (PULMICORT ) 0.25 MG/2ML nebulizer solution, Inhale 0.25 mg into the lungs., Disp: , Rfl:    budesonide -formoterol  (SYMBICORT ) 160-4.5 MCG/ACT inhaler, Inhale 2 puffs into the lungs 2 (two) times daily as needed., Disp: 10.2 g, Rfl: 1   calcium-vitamin D  250-100 MG-UNIT tablet, Take 1 tablet by mouth daily., Disp: , Rfl:    Cholecalciferol (VITAMIN D -3) 125 MCG (5000 UT) TABS, Take 5,000 Units by mouth daily., Disp: , Rfl:    cyanocobalamin  2000 MCG tablet, Take 5,000  mcg by mouth daily., Disp: , Rfl:    diclofenac  Sodium (VOLTAREN ) 1 % GEL, Apply 2-4 g topically 4 (four) times daily. APPLY 2-4 GRAMS TOPICALLY FOUR TIMES DAILY Strength: 1 %, Disp: 200 g, Rfl: 3   dorzolamide  (TRUSOPT ) 2 % ophthalmic solution, Place 1 drop into the right eye daily., Disp: , Rfl:    doxepin  (SINEQUAN ) 100 MG capsule, Take 1 capsule (100 mg total) by mouth at bedtime., Disp: 90 capsule, Rfl: 1   famotidine  (PEPCID ) 20 MG tablet, Take 20 mg by mouth., Disp: , Rfl:    gabapentin  (NEURONTIN ) 300 MG capsule, Take 300 mg by mouth 4 (four) times daily as needed (pain)., Disp: , Rfl:    IVERMECTIN PO, Take by mouth., Disp: , Rfl:    levocetirizine (XYZAL) 5 MG tablet, SMARTSIG:1 Tablet(s) By Mouth Every Evening, Disp: , Rfl:    lubiprostone  (AMITIZA ) 24 MCG capsule, TAKE 1 CAPSULE BY MOUTH TWICE A DAY WITH MEALS, Disp: 180 capsule, Rfl: 3   magnesium  oxide (MAG-OX) 400 (240 Mg) MG tablet, Take 400 mg by mouth every Monday, Wednesday, and Friday., Disp: , Rfl:    meloxicam  (MOBIC ) 15 MG tablet, Take 15 mg by mouth daily., Disp: , Rfl:    metoprolol  tartrate (LOPRESSOR ) 25 MG tablet, Take 0.5 tablets (12.5 mg total) by mouth daily as needed. If BP and HR is greater than 100 and for  palpitations, Disp: 90 tablet, Rfl: 1   mometasone  (ELOCON ) 0.1 % cream, Apply topically daily as needed for itch, Disp: 15 g, Rfl: 3   omeprazole  (PRILOSEC) 40 MG capsule, Take 1 capsule by mouth once daily, Disp: 90 capsule, Rfl: 3   Potassium 99 MG TABS, Take 99 mg by mouth every Monday, Wednesday, and Friday., Disp: , Rfl:    predniSONE  (DELTASONE ) 10 MG tablet, For breathing or pain increase prednisone   to 10 mg x 2 until better then 1 daily x 5 days then one half, Disp: 100 tablet, Rfl: 0   senna (SENOKOT) 8.6 MG tablet, Take 5 tablets by mouth 2 (two) times daily., Disp: , Rfl:    Travoprost, BAK Free, (TRAVATAN) 0.004 % SOLN ophthalmic solution, Place 1 drop into both eyes at bedtime., Disp: , Rfl:  No current facility-administered medications for this visit.  Facility-Administered Medications Ordered in Other Visits:    regadenoson  (LEXISCAN ) injection SOLN 0.4 mg, 0.4 mg, Intravenous, Once, Hilty, Vinie BROCKS, MD   technetium tetrofosmin  (TC-MYOVIEW ) injection 31.4 millicurie, 31.4 millicurie, Intravenous, Once PRN, Hilty, Vinie BROCKS, MD A complete ROS was performed with pertinent positives/negatives noted in the HPI. The remainder of the ROS are negative.   Physical Exam:  There were no vitals taken for this visit. General: Well developed, well nourished. No acute distress. Voice hoarse, with intermittent return of normal voice between sentences Head/Face: Normocephalic. No sinus tenderness. Facial nerve intact and equal bilaterally. No facial lacerations. Eyes: PERRL, no scleral icterus or conjunctival hemorrhage. EOMI. Ears: No gross deformity. Hearing: Normal speech reception.  Nose: No gross deformity or lesions. No purulent discharge. No turbinate hypertrophy. Mouth/Oropharynx: Lips without any lesions. Dentition good. No mucosal lesions within the oropharynx. No tonsillar enlargement, exudate, or lesions. Pharyngeal walls symmetrical. Uvula midline. Tongue midline without  lesions. Larynx: See TFL if applicable Nasopharynx: See TFL if applicable Neck: Trachea midline. No masses. No thyromegaly or nodules palpated. No crepitus. Lymphatic: No lymphadenopathy in the neck. Respiratory: No stridor or distress. Room air. Cardiovascular: Regular rate and rhythm. Extremities: No edema or cyanosis. Warm  and well-perfused. Skin: No scars or lesions on face or neck. Neurologic: CN II-XII grossly intact. Moving all extremities without gross abnormality. Other:  Independent Review of Additional Tests or Records: None Procedures:  Preoperative diagnosis: vocal cord paralysis, vocal cord atrophy  Postoperative diagnosis:   same  Procedure: Flexible fiberoptic laryngoscopy with stroboscopy - CPT [id number]   Surgeon: Adah Malkin, DO  Anesthesia: 4% Topical lidocaine  and Afrin  Complications: None  Condition is stable throughout exam  Indications and consent:   The patient presents to the clinic with hoarseness. All the risks, benefits, and potential complications were reviewed with the patient preoperatively and informed verbal consent was obtained.  Procedure: The patient was seated upright in the exam chair. Topical lidocaine  and Afrin were applied to the nasal cavity. After adequate anesthesia had occurred, the flexible telescope with strobe capabilities was passed into the nasal cavity. The nasopharynx was patent without mass or lesion. The scope was passed behind the soft palate and directed toward the base of tongue. The base of tongue was visualized and was symmetric with no apparent masses or abnormal appearing tissue. There were no signs of a mass or pooling of secretions in the piriform sinuses. The supraglottic structures were normal.  The left true vocal cord is mobile with moderate atrophy noted. The right true vocal cord was immobile. The medial edges were straight and free of any lesions. Closure was complete with effort. Periodicity present. The mucosal  wave and amplitude were normal. There is mild interarytenoid pachydermia and post cricoid edema. The mucosa appears healthy.   The laryngoscope was then slowly withdrawn and the patient tolerated the procedure well. There were no complications or blood loss.   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  Adah Malkin, DO [facility]

## 2024-01-11 ENCOUNTER — Encounter (HOSPITAL_COMMUNITY): Payer: Self-pay | Admitting: Emergency Medicine

## 2024-01-13 ENCOUNTER — Other Ambulatory Visit: Payer: Self-pay | Admitting: Emergency Medicine

## 2024-01-13 DIAGNOSIS — C799 Secondary malignant neoplasm of unspecified site: Secondary | ICD-10-CM

## 2024-01-14 ENCOUNTER — Encounter: Admitting: Pulmonary Disease

## 2024-01-26 ENCOUNTER — Ambulatory Visit

## 2024-01-29 NOTE — Progress Notes (Shared)
 " [facility] - Clinic Note  02/03/2024   CHIEF COMPLAINT Patient presents for No chief complaint on file.  HISTORY OF PRESENT ILLNESS: Zoe [name] is a 89 y.o. female who presents to the clinic today for:    Patient states that she had a paralyzed vocal cord since the last visit. She is currently being treated for cancer.   Referring physician: Fleeta Zerita DASEN, MD [address] [place],  [place] [address]  HISTORICAL INFORMATION:  Selected notes from the MEDICAL RECORD NUMBER Referred by Dr. Fleeta for concern of exu ARMD OD LEE:  Ocular Hx- PMH-   CURRENT MEDICATIONS: Current Outpatient Medications (Ophthalmic Drugs)  Medication Sig   dorzolamide  (TRUSOPT ) 2 % ophthalmic solution Place 1 drop into the right eye daily.   Travoprost, BAK Free, (TRAVATAN) 0.004 % SOLN ophthalmic solution Place 1 drop into both eyes at bedtime.   No current facility-administered medications for this visit. (Ophthalmic Drugs)   Current Outpatient Medications (Other)  Medication Sig   albuterol  (PROVENTIL ) (2.5 MG/3ML) 0.083% nebulizer solution Take 3 mLs (2.5 mg total) by nebulization every 6 (six) hours as needed.   albuterol  (VENTOLIN  HFA) 108 (90 Base) MCG/ACT inhaler Inhale 2 puffs into the lungs every 6 (six) hours as needed for wheezing or shortness of breath.   allopurinol  (ZYLOPRIM ) 300 MG tablet Take 1 tablet (300 mg total) by mouth daily.   ALPRAZolam  (XANAX ) 0.5 MG tablet Take 0.5 mg by mouth 2 (two) times daily as needed for anxiety.   apixaban  (ELIQUIS ) 5 MG TABS tablet TAKE 1 TABLET BY MOUTH TWICE A DAY   APPLE CIDER VINEGAR PO Take 625 mg by mouth daily.   Biotin  5000 MCG TABS Take 5,000-10,000 mcg by mouth daily.   budesonide  (PULMICORT ) 0.25 MG/2ML nebulizer solution Inhale 0.25 mg into the lungs.   budesonide -formoterol  (SYMBICORT ) 160-4.5 MCG/ACT inhaler Inhale 2 puffs into the lungs 2 (two) times daily as needed.   calcium-vitamin D  250-100 MG-UNIT  tablet Take 1 tablet by mouth daily.   Cholecalciferol (VITAMIN D -3) 125 MCG (5000 UT) TABS Take 5,000 Units by mouth daily.   cyanocobalamin  2000 MCG tablet Take 5,000 mcg by mouth daily.   diclofenac  Sodium (VOLTAREN ) 1 % GEL Apply 2-4 g topically 4 (four) times daily. APPLY 2-4 GRAMS TOPICALLY FOUR TIMES DAILY Strength: 1 %   doxepin  (SINEQUAN ) 100 MG capsule Take 1 capsule (100 mg total) by mouth at bedtime.   famotidine  (PEPCID ) 20 MG tablet Take 20 mg by mouth.   gabapentin  (NEURONTIN ) 300 MG capsule Take 300 mg by mouth 4 (four) times daily as needed (pain).   IVERMECTIN PO Take by mouth.   levocetirizine (XYZAL) 5 MG tablet SMARTSIG:1 Tablet(s) By Mouth Every Evening   lubiprostone  (AMITIZA ) 24 MCG capsule TAKE 1 CAPSULE BY MOUTH TWICE A DAY WITH MEALS   magnesium  oxide (MAG-OX) 400 (240 Mg) MG tablet Take 400 mg by mouth every Monday, Wednesday, and Friday.   meloxicam  (MOBIC ) 15 MG tablet Take 15 mg by mouth daily.   metoprolol  tartrate (LOPRESSOR ) 25 MG tablet Take 0.5 tablets (12.5 mg total) by mouth daily as needed. If BP and HR is greater than 100 and for palpitations   mometasone  (ELOCON ) 0.1 % cream Apply topically daily as needed for itch   omeprazole  (PRILOSEC) 40 MG capsule Take 1 capsule by mouth once daily   Potassium 99 MG TABS Take 99 mg by mouth every Monday, Wednesday, and Friday.   predniSONE  (DELTASONE ) 10  MG tablet For breathing or pain increase prednisone   to 10 mg x 2 until better then 1 daily x 5 days then one half   senna (SENOKOT) 8.6 MG tablet Take 5 tablets by mouth 2 (two) times daily.   No current facility-administered medications for this visit. (Other)   Facility-Administered Medications Ordered in Other Visits (Other)  Medication Route   regadenoson  (LEXISCAN ) injection SOLN 0.4 mg Intravenous   technetium tetrofosmin  (TC-MYOVIEW ) injection 31.4 millicurie Intravenous   REVIEW OF SYSTEMS:    ALLERGIES Allergies  Allergen Reactions   Codeine  Other (See Comments)    Pains in abdominal area   Other     NOVACAINE     ITCHING Anti-depressants   Oxycodone      Abdominal pain, chest pain   Paroxetine Other (See Comments)   Paxil [Paroxetine Hcl] Itching   Procaine Itching   PAST MEDICAL HISTORY Past Medical History:  Diagnosis Date   A-fib [facility])    pt went into A-fib during lung biopsies   Abdominal discomfort 11/02/2012   suspected ?UTI- PCP MD placed on Cipro    Anxiety    Arthritis    Asthma    Cancer (HCC)    skin CA   Choking due to phlegm    Chronic constipation    Depression    Dysrhythmia    tachycardia   Environmental allergies    Fibromyalgia    GERD (gastroesophageal reflux disease)    Hemorrhoids    Hiatal hernia    Hypercholesteremia    Hypertension    Sleep apnea    Past Surgical History:  Procedure Laterality Date   ANAL RECTAL MANOMETRY N/A 11/28/2013   Procedure: ANO RECTAL MANOMETRY;  Surgeon: Bernarda Ned, MD;  Location: [facility];  Service: Endoscopy;  Laterality: N/A;   APPENDECTOMY     BACK SURGERY     X2    BIOPSY  03/13/2021   Procedure: BIOPSY;  Surgeon: Golda Claudis PENNER, MD;  Location: [facility];  Service: Endoscopy;;   BRONCHIAL BIOPSY  09/02/2022   Procedure: BRONCHIAL BIOPSIES;  Surgeon: Brenna Adine CROME, DO;  Location: [facility];  Service: Pulmonary;;   BRONCHIAL BRUSHINGS  09/02/2022   Procedure: BRONCHIAL BRUSHINGS;  Surgeon: Brenna Adine CROME, DO;  Location: [facility];  Service: Pulmonary;;   BRONCHIAL NEEDLE ASPIRATION BIOPSY  09/02/2022   Procedure: BRONCHIAL NEEDLE ASPIRATION BIOPSIES;  Surgeon: Brenna Adine CROME, DO;  Location: [facility];  Service: Pulmonary;;   CHOLECYSTECTOMY     COLONOSCOPY  03/29/2003   COLONOSCOPY N/A 12/09/2013   Procedure: COLONOSCOPY;  Surgeon: Claudis PENNER Golda, MD;  [facility]: [facility];  Service: Endoscopy;  Laterality: N/A;  730   COLONOSCOPY N/A 02/04/2017   Procedure: COLONOSCOPY;  Surgeon: Golda Claudis PENNER, MD;  [facility];  Service: Endoscopy;  Laterality: N/A;  930   ENDOBRONCHIAL ULTRASOUND Bilateral 09/02/2022   Procedure: ENDOBRONCHIAL ULTRASOUND;  Surgeon: Brenna Adine CROME, DO;  Location: [facility];  Service: Pulmonary;  Laterality: Bilateral;   ESOPHAGEAL DILATION N/A 03/13/2021   Procedure: ESOPHAGEAL DILATION;  Surgeon: Golda Claudis PENNER, MD;  [facility];  Service: Endoscopy;  Laterality: N/A;   ESOPHAGOGASTRODUODENOSCOPY (EGD) WITH PROPOFOL  N/A 03/13/2021   Procedure: ESOPHAGOGASTRODUODENOSCOPY (EGD) WITH PROPOFOL ;  Surgeon: Golda Claudis PENNER, MD;  [facility]: [facility];  Service: Endoscopy;  Laterality: N/A;  8:05   EYE SURGERY     bilateral cataract removal   INTERCOSTAL NERVE BLOCK Right 11/17/2022   Procedure: INTERCOSTAL NERVE  BLOCK;  Surgeon: Shyrl Linnie KIDD, MD;  Location: [facility] OR;  Service: Thoracic;  Laterality: Right;   IR KYPHO LUMBAR INC FX REDUCE BONE BX UNI/BIL CANNULATION INC/IMAGING  11/24/2019   IR KYPHO THORACIC WITH BONE BIOPSY  08/23/2019   IR KYPHO THORACIC WITH BONE BIOPSY  11/24/2019   IR RADIOLOGIST EVAL & MGMT  08/03/2019   IR RADIOLOGIST EVAL & MGMT  09/29/2019   IR RADIOLOGIST EVAL & MGMT  10/18/2019   IR RADIOLOGIST EVAL & MGMT  04/11/2020   NODE DISSECTION Right 11/17/2022   Procedure: NODE DISSECTION;  Surgeon: Shyrl Linnie KIDD, MD;  Location: [facility];  Service: Thoracic;  Laterality: Right;   SKIN CANCER EXCISION     RIGHT NECK     SPINE SURGERY     TONSILLECTOMY     T+A   TOTAL HIP ARTHROPLASTY Right 09/14/2012   Procedure: RIGHT TOTAL HIP ARTHROPLASTY ANTERIOR APPROACH and RIGHT KNEE STEROID INJECTION;  Surgeon: Lonni CINDERELLA Poli, MD;  Location: [facility];  Service: Orthopedics;  Laterality: Right;   TOTAL HIP ARTHROPLASTY Left 11/05/2012   Procedure: LEFT TOTAL HIP ARTHROPLASTY ANTERIOR APPROACH;  Surgeon: Lonni CINDERELLA Poli, MD;  Location: [facility];  Service: Orthopedics;  Laterality: Left;   UMBILICAL HERNIA REPAIR   04/26/2003   JENKINS   UPPER GASTROINTESTINAL ENDOSCOPY  08/03/2009   NUR   UPPER GASTROINTESTINAL ENDOSCOPY  03/29/2003   EGD ED TCS   FAMILY HISTORY Family History  Problem Relation Age of Onset   Healthy Son    Healthy Son    Healthy Daughter    Colon cancer Neg Hx    SOCIAL HISTORY Social History   Tobacco Use   Smoking status: Former    Current packs/day: 0.00    Average packs/day: 0.5 packs/day for 50.0 years (25.0 ttl pk-yrs)    Types: Cigarettes    Start date: 09/07/1970    Quit date: 09/06/2020    Years since quitting: 3.3   Smokeless tobacco: Never  Vaping Use   Vaping status: Never Used  Substance Use Topics   Alcohol  use: Yes    Comment: 1-2 drinks per week   Drug use: No       OPHTHALMIC EXAM:  Not recorded    IMAGING AND PROCEDURES  Imaging and Procedures for 02/03/2024         ASSESSMENT/PLAN: No diagnosis found.  1. 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, OD - s/p IVA OD #1 (01.28.25), #2 (01.28.25), #3 (03.25.25), #4 (06.18.2025)  -- IVA resistance ============== - s/p IVV OD #1 (04.21.25 sample), #2 (05.21.25 sample), #3 (06.18.25 sample), #4 (07.18.25 sample), #5 (09.09.25 sample), #6 (sample 10.08.25), #7 (sample--11.24.25)  - exam shows central CNV w/ +heme -- improved  - BCVA OD 20/60 - OCT shows Interval increase in central PED / [facility] and IRF / cystic changes overlying, RPE rip, SRF stably improved at 6 wks - recommend IVV today OD #8 (sample--01.07.26) w/ f/u in 6 wks - pt wishes to be treated with IVV - RBA of procedure discussed, questions answered - IVV informed consent obtained and signed, 04.23.25 - see procedure note  - f/u in 6wks -- DFE/OCT, possible injection   2. Age related macular degeneration, non-exudative, OS - The incidence, anatomy, and pathology of dry AMD, risk of progression, and the AREDS and AREDS 2 study including smoking risks discussed with patient.  - Recommend amsler grid monitoring  -  monitor  3,4. Hypertensive retinopathy OU - discussed importance of tight BP control -  monitor  5. Pseudophakia OU  - s/p CE/IOL (Dr. Alverta)  - IOL in good position, doing well  - monitor  Ophthalmic Meds Ordered this visit:  No orders of the defined types were placed in this encounter.    No follow-ups on file.  There are no Patient Instructions on file for this visit.  This document serves as a record of services personally performed by Redell JUDITHANN Hans, MD, PhD. It was created on their behalf by Almetta Pesa, an ophthalmic technician. The creation of this record is the provider's dictation and/or activities during the visit.    Electronically signed by: Almetta Pesa, OA, 01/29/2024  9:18 AM   Redell JUDITHANN Hans, M.D., Ph.D. Diseases & Surgery of the Retina and Vitreous [facility]   Abbreviations: M myopia (nearsighted); A astigmatism; H hyperopia (farsighted); P presbyopia; Mrx spectacle prescription;  CTL contact lenses; OD right eye; OS left eye; OU both eyes  XT exotropia; ET esotropia; PEK punctate epithelial keratitis; PEE punctate epithelial erosions; DES dry eye syndrome; MGD meibomian gland dysfunction; ATs artificial tears; PFAT's preservative free artificial tears; NSC nuclear sclerotic cataract; PSC posterior subcapsular cataract; ERM epi-retinal membrane; PVD posterior vitreous detachment; RD retinal detachment; DM diabetes mellitus; DR diabetic retinopathy; NPDR non-proliferative diabetic retinopathy; PDR proliferative diabetic retinopathy; CSME clinically significant macular edema; DME diabetic macular edema; dbh dot blot hemorrhages; CWS cotton wool spot; POAG primary open angle glaucoma; C/D cup-to-disc ratio; HVF humphrey visual field; GVF goldmann visual field; OCT optical coherence tomography; IOP intraocular pressure; BRVO Branch retinal vein occlusion; CRVO central retinal vein occlusion; CRAO central retinal artery occlusion; BRAO branch  retinal artery occlusion; RT retinal tear; SB scleral buckle; PPV pars plana vitrectomy; VH Vitreous hemorrhage; PRP panretinal laser photocoagulation; IVK intravitreal kenalog ; VMT vitreomacular traction; MH Macular hole;  NVD neovascularization of the disc; NVE neovascularization elsewhere; AREDS age related eye disease study; ARMD age related macular degeneration; POAG primary open angle glaucoma; EBMD epithelial/anterior basement membrane dystrophy; ACIOL anterior chamber intraocular lens; IOL intraocular lens; PCIOL posterior chamber intraocular lens; Phaco/IOL phacoemulsification with intraocular lens placement; PRK photorefractive keratectomy; LASIK laser assisted in situ keratomileusis; HTN hypertension; DM diabetes mellitus; COPD chronic obstructive pulmonary disease  "

## 2024-02-03 ENCOUNTER — Encounter (INDEPENDENT_AMBULATORY_CARE_PROVIDER_SITE_OTHER): Admitting: Ophthalmology

## 2024-02-03 DIAGNOSIS — H35033 Hypertensive retinopathy, bilateral: Secondary | ICD-10-CM

## 2024-02-03 DIAGNOSIS — H353211 Exudative age-related macular degeneration, right eye, with active choroidal neovascularization: Secondary | ICD-10-CM

## 2024-02-03 DIAGNOSIS — H353122 Nonexudative age-related macular degeneration, left eye, intermediate dry stage: Secondary | ICD-10-CM

## 2024-02-03 DIAGNOSIS — I10 Essential (primary) hypertension: Secondary | ICD-10-CM

## 2024-02-03 DIAGNOSIS — Z961 Presence of intraocular lens: Secondary | ICD-10-CM

## 2024-02-04 ENCOUNTER — Encounter (HOSPITAL_COMMUNITY): Payer: Self-pay

## 2024-02-04 ENCOUNTER — Ambulatory Visit (HOSPITAL_COMMUNITY): Admission: RE | Admit: 2024-02-04

## 2024-02-11 ENCOUNTER — Inpatient Hospital Stay (HOSPITAL_COMMUNITY): Admission: RE | Admit: 2024-02-11 | Source: Ambulatory Visit

## 2024-03-02 ENCOUNTER — Encounter: Admitting: Pulmonary Disease

## 2024-05-02 ENCOUNTER — Encounter: Admitting: Pulmonary Disease

## 2024-05-04 ENCOUNTER — Ambulatory Visit (INDEPENDENT_AMBULATORY_CARE_PROVIDER_SITE_OTHER)

## 2024-05-19 ENCOUNTER — Ambulatory Visit (INDEPENDENT_AMBULATORY_CARE_PROVIDER_SITE_OTHER): Admitting: Otolaryngology
